# Patient Record
Sex: Female | Born: 1950 | Race: White | Hispanic: No | Marital: Married | Smoking: Former smoker
Health system: Southern US, Community
[De-identification: ages and names within clinical notes are randomized; demographics above are authoritative.]

## PROBLEM LIST (undated history)

## (undated) DIAGNOSIS — L02818 Cutaneous abscess of other sites: Secondary | ICD-10-CM

## (undated) DIAGNOSIS — K469 Unspecified abdominal hernia without obstruction or gangrene: Secondary | ICD-10-CM

## (undated) DIAGNOSIS — G8929 Other chronic pain: Secondary | ICD-10-CM

## (undated) DIAGNOSIS — F419 Anxiety disorder, unspecified: Secondary | ICD-10-CM

## (undated) DIAGNOSIS — L03818 Cellulitis of other sites: Secondary | ICD-10-CM

## (undated) DIAGNOSIS — Z8673 Personal history of transient ischemic attack (TIA), and cerebral infarction without residual deficits: Secondary | ICD-10-CM

## (undated) DIAGNOSIS — I700 Atherosclerosis of aorta: Secondary | ICD-10-CM

## (undated) DIAGNOSIS — S42302A Unspecified fracture of shaft of humerus, left arm, initial encounter for closed fracture: Secondary | ICD-10-CM

## (undated) DIAGNOSIS — F32A Depression, unspecified: Secondary | ICD-10-CM

## (undated) DIAGNOSIS — E538 Deficiency of other specified B group vitamins: Secondary | ICD-10-CM

## (undated) DIAGNOSIS — M545 Low back pain, unspecified: Secondary | ICD-10-CM

## (undated) DIAGNOSIS — M542 Cervicalgia: Secondary | ICD-10-CM

## (undated) DIAGNOSIS — G931 Anoxic brain damage, not elsewhere classified: Secondary | ICD-10-CM

## (undated) DIAGNOSIS — F329 Major depressive disorder, single episode, unspecified: Secondary | ICD-10-CM

## (undated) DIAGNOSIS — J449 Chronic obstructive pulmonary disease, unspecified: Secondary | ICD-10-CM

## (undated) DIAGNOSIS — G40909 Epilepsy, unspecified, not intractable, without status epilepticus: Secondary | ICD-10-CM

## (undated) DIAGNOSIS — K219 Gastro-esophageal reflux disease without esophagitis: Secondary | ICD-10-CM

## (undated) DIAGNOSIS — M25519 Pain in unspecified shoulder: Secondary | ICD-10-CM

## (undated) DIAGNOSIS — G629 Polyneuropathy, unspecified: Secondary | ICD-10-CM

## (undated) DIAGNOSIS — R269 Unspecified abnormalities of gait and mobility: Secondary | ICD-10-CM

## (undated) DIAGNOSIS — M1990 Unspecified osteoarthritis, unspecified site: Secondary | ICD-10-CM

## (undated) DIAGNOSIS — E785 Hyperlipidemia, unspecified: Secondary | ICD-10-CM

## (undated) DIAGNOSIS — E119 Type 2 diabetes mellitus without complications: Secondary | ICD-10-CM

## (undated) DIAGNOSIS — R569 Unspecified convulsions: Secondary | ICD-10-CM

## (undated) DIAGNOSIS — N390 Urinary tract infection, site not specified: Secondary | ICD-10-CM

## (undated) HISTORY — PX: TONSILLECTOMY: SUR1361

## (undated) HISTORY — PX: COLONOSCOPY: SHX174

## (undated) HISTORY — PX: TUBAL LIGATION: SHX77

## (undated) HISTORY — DX: Anoxic brain damage, not elsewhere classified: G93.1

## (undated) HISTORY — DX: Anxiety disorder, unspecified: F41.9

## (undated) HISTORY — DX: Other chronic pain: G89.29

## (undated) HISTORY — DX: Cervicalgia: M54.2

## (undated) HISTORY — DX: Low back pain: M54.5

## (undated) HISTORY — DX: Unspecified abnormalities of gait and mobility: R26.9

## (undated) HISTORY — DX: Type 2 diabetes mellitus without complications: E11.9

## (undated) HISTORY — PX: HERNIA REPAIR: SHX51

## (undated) HISTORY — DX: Hyperlipidemia, unspecified: E78.5

## (undated) HISTORY — PX: PANCREATECTOMY: SHX1019

## (undated) HISTORY — DX: Epilepsy, unspecified, not intractable, without status epilepticus: G40.909

## (undated) HISTORY — DX: Pain in unspecified shoulder: M25.519

## (undated) HISTORY — DX: Gastro-esophageal reflux disease without esophagitis: K21.9

## (undated) HISTORY — DX: Unspecified osteoarthritis, unspecified site: M19.90

## (undated) HISTORY — PX: DIAGNOSTIC LAPAROSCOPY: SUR761

## (undated) HISTORY — DX: Major depressive disorder, single episode, unspecified: F32.9

## (undated) HISTORY — DX: Chronic obstructive pulmonary disease, unspecified: J44.9

## (undated) HISTORY — DX: Depression, unspecified: F32.A

## (undated) HISTORY — DX: Low back pain, unspecified: M54.50

## (undated) HISTORY — DX: Deficiency of other specified B group vitamins: E53.8

---

## 1998-06-27 ENCOUNTER — Emergency Department (HOSPITAL_COMMUNITY): Admission: EM | Admit: 1998-06-27 | Discharge: 1998-06-28 | Payer: Self-pay | Admitting: Emergency Medicine

## 1998-07-11 ENCOUNTER — Encounter: Admission: RE | Admit: 1998-07-11 | Discharge: 1998-10-09 | Payer: Self-pay | Admitting: Internal Medicine

## 1998-11-15 ENCOUNTER — Encounter: Admission: RE | Admit: 1998-11-15 | Discharge: 1998-12-23 | Payer: Self-pay

## 1998-12-22 ENCOUNTER — Other Ambulatory Visit: Admission: RE | Admit: 1998-12-22 | Discharge: 1998-12-22 | Payer: Self-pay | Admitting: Obstetrics and Gynecology

## 1999-01-18 ENCOUNTER — Encounter: Admission: RE | Admit: 1999-01-18 | Discharge: 1999-04-13 | Payer: Self-pay | Admitting: Anesthesiology

## 1999-01-20 ENCOUNTER — Encounter: Payer: Self-pay | Admitting: Anesthesiology

## 1999-01-20 ENCOUNTER — Ambulatory Visit (HOSPITAL_COMMUNITY): Admission: RE | Admit: 1999-01-20 | Discharge: 1999-01-20 | Payer: Self-pay | Admitting: Anesthesiology

## 1999-02-13 ENCOUNTER — Other Ambulatory Visit: Admission: RE | Admit: 1999-02-13 | Discharge: 1999-02-13 | Payer: Self-pay | Admitting: Obstetrics and Gynecology

## 1999-03-27 ENCOUNTER — Encounter: Admission: RE | Admit: 1999-03-27 | Discharge: 1999-05-01 | Payer: Self-pay | Admitting: Orthopedic Surgery

## 1999-04-13 ENCOUNTER — Encounter: Admission: RE | Admit: 1999-04-13 | Discharge: 1999-05-01 | Payer: Self-pay | Admitting: Anesthesiology

## 1999-04-18 ENCOUNTER — Ambulatory Visit (HOSPITAL_COMMUNITY): Admission: RE | Admit: 1999-04-18 | Discharge: 1999-04-18 | Payer: Self-pay | Admitting: Internal Medicine

## 1999-04-18 ENCOUNTER — Encounter (INDEPENDENT_AMBULATORY_CARE_PROVIDER_SITE_OTHER): Payer: Self-pay

## 1999-05-02 ENCOUNTER — Encounter: Admission: RE | Admit: 1999-05-02 | Discharge: 1999-07-31 | Payer: Self-pay | Admitting: Anesthesiology

## 1999-05-03 ENCOUNTER — Encounter: Admission: RE | Admit: 1999-05-03 | Discharge: 1999-05-10 | Payer: Self-pay | Admitting: Orthopedic Surgery

## 1999-06-19 ENCOUNTER — Encounter: Admission: RE | Admit: 1999-06-19 | Discharge: 1999-09-17 | Payer: Self-pay | Admitting: Internal Medicine

## 1999-08-03 ENCOUNTER — Other Ambulatory Visit: Admission: RE | Admit: 1999-08-03 | Discharge: 1999-08-03 | Payer: Self-pay | Admitting: Obstetrics and Gynecology

## 1999-08-08 ENCOUNTER — Encounter: Admission: RE | Admit: 1999-08-08 | Discharge: 1999-11-06 | Payer: Self-pay | Admitting: Anesthesiology

## 1999-10-13 ENCOUNTER — Encounter: Admission: RE | Admit: 1999-10-13 | Discharge: 1999-12-01 | Payer: Self-pay | Admitting: Internal Medicine

## 1999-10-21 ENCOUNTER — Encounter: Payer: Self-pay | Admitting: Emergency Medicine

## 1999-10-21 ENCOUNTER — Emergency Department (HOSPITAL_COMMUNITY): Admission: EM | Admit: 1999-10-21 | Discharge: 1999-10-21 | Payer: Self-pay | Admitting: Emergency Medicine

## 1999-12-01 ENCOUNTER — Encounter: Admission: RE | Admit: 1999-12-01 | Discharge: 1999-12-22 | Payer: Self-pay | Admitting: Anesthesiology

## 2000-01-26 ENCOUNTER — Encounter: Admission: RE | Admit: 2000-01-26 | Discharge: 2000-03-08 | Payer: Self-pay | Admitting: Anesthesiology

## 2000-02-12 ENCOUNTER — Other Ambulatory Visit: Admission: RE | Admit: 2000-02-12 | Discharge: 2000-02-12 | Payer: Self-pay | Admitting: Obstetrics and Gynecology

## 2000-03-08 ENCOUNTER — Encounter: Admission: RE | Admit: 2000-03-08 | Discharge: 2000-06-06 | Payer: Self-pay | Admitting: Anesthesiology

## 2000-06-07 ENCOUNTER — Encounter: Admission: RE | Admit: 2000-06-07 | Discharge: 2000-09-05 | Payer: Self-pay | Admitting: Anesthesiology

## 2000-08-07 ENCOUNTER — Encounter: Admission: RE | Admit: 2000-08-07 | Discharge: 2000-10-14 | Payer: Self-pay | Admitting: Anesthesiology

## 2000-08-14 ENCOUNTER — Other Ambulatory Visit: Admission: RE | Admit: 2000-08-14 | Discharge: 2000-08-14 | Payer: Self-pay | Admitting: Obstetrics and Gynecology

## 2000-08-20 ENCOUNTER — Encounter: Admission: RE | Admit: 2000-08-20 | Discharge: 2000-08-20 | Payer: Self-pay | Admitting: Obstetrics and Gynecology

## 2000-08-20 ENCOUNTER — Encounter: Payer: Self-pay | Admitting: Obstetrics and Gynecology

## 2000-09-05 ENCOUNTER — Encounter: Admission: RE | Admit: 2000-09-05 | Discharge: 2000-12-04 | Payer: Self-pay | Admitting: Anesthesiology

## 2000-10-15 ENCOUNTER — Emergency Department (HOSPITAL_COMMUNITY): Admission: EM | Admit: 2000-10-15 | Discharge: 2000-10-15 | Payer: Self-pay | Admitting: Emergency Medicine

## 2000-10-15 ENCOUNTER — Encounter: Payer: Self-pay | Admitting: Emergency Medicine

## 2000-12-03 ENCOUNTER — Encounter: Admission: RE | Admit: 2000-12-03 | Discharge: 2000-12-03 | Payer: Self-pay | Admitting: Obstetrics and Gynecology

## 2000-12-03 ENCOUNTER — Encounter: Payer: Self-pay | Admitting: Obstetrics and Gynecology

## 2001-01-24 ENCOUNTER — Encounter: Admission: RE | Admit: 2001-01-24 | Discharge: 2001-04-24 | Payer: Self-pay | Admitting: Anesthesiology

## 2001-01-25 ENCOUNTER — Encounter: Payer: Self-pay | Admitting: Neurology

## 2001-01-25 ENCOUNTER — Ambulatory Visit (HOSPITAL_COMMUNITY): Admission: RE | Admit: 2001-01-25 | Discharge: 2001-01-25 | Payer: Self-pay | Admitting: Neurology

## 2001-02-20 ENCOUNTER — Other Ambulatory Visit: Admission: RE | Admit: 2001-02-20 | Discharge: 2001-02-20 | Payer: Self-pay | Admitting: Obstetrics and Gynecology

## 2001-10-17 ENCOUNTER — Other Ambulatory Visit: Admission: RE | Admit: 2001-10-17 | Discharge: 2001-10-17 | Payer: Self-pay | Admitting: Obstetrics and Gynecology

## 2002-02-21 ENCOUNTER — Emergency Department (HOSPITAL_COMMUNITY): Admission: EM | Admit: 2002-02-21 | Discharge: 2002-02-21 | Payer: Self-pay | Admitting: Emergency Medicine

## 2002-02-21 ENCOUNTER — Encounter: Payer: Self-pay | Admitting: Emergency Medicine

## 2002-05-20 ENCOUNTER — Encounter: Payer: Self-pay | Admitting: Otolaryngology

## 2002-05-20 ENCOUNTER — Encounter: Admission: RE | Admit: 2002-05-20 | Discharge: 2002-05-20 | Payer: Self-pay | Admitting: Otolaryngology

## 2002-09-21 ENCOUNTER — Emergency Department (HOSPITAL_COMMUNITY): Admission: EM | Admit: 2002-09-21 | Discharge: 2002-09-21 | Payer: Self-pay

## 2002-11-02 ENCOUNTER — Encounter: Admission: RE | Admit: 2002-11-02 | Discharge: 2003-01-31 | Payer: Self-pay | Admitting: Internal Medicine

## 2002-12-09 ENCOUNTER — Other Ambulatory Visit: Admission: RE | Admit: 2002-12-09 | Discharge: 2002-12-09 | Payer: Self-pay | Admitting: Obstetrics and Gynecology

## 2003-01-22 ENCOUNTER — Encounter: Payer: Self-pay | Admitting: Internal Medicine

## 2003-01-22 ENCOUNTER — Encounter: Admission: RE | Admit: 2003-01-22 | Discharge: 2003-01-22 | Payer: Self-pay | Admitting: Internal Medicine

## 2003-02-01 ENCOUNTER — Encounter: Admission: RE | Admit: 2003-02-01 | Discharge: 2003-05-02 | Payer: Self-pay | Admitting: Internal Medicine

## 2003-04-21 ENCOUNTER — Encounter (INDEPENDENT_AMBULATORY_CARE_PROVIDER_SITE_OTHER): Payer: Self-pay | Admitting: *Deleted

## 2003-04-21 ENCOUNTER — Ambulatory Visit (HOSPITAL_COMMUNITY): Admission: RE | Admit: 2003-04-21 | Discharge: 2003-04-21 | Payer: Self-pay | Admitting: Gastroenterology

## 2003-04-21 LAB — HM COLONOSCOPY

## 2003-05-21 ENCOUNTER — Encounter: Payer: Self-pay | Admitting: Gastroenterology

## 2003-05-21 ENCOUNTER — Ambulatory Visit (HOSPITAL_COMMUNITY): Admission: RE | Admit: 2003-05-21 | Discharge: 2003-05-21 | Payer: Self-pay | Admitting: Gastroenterology

## 2003-08-10 ENCOUNTER — Encounter: Admission: RE | Admit: 2003-08-10 | Discharge: 2003-09-09 | Payer: Self-pay | Admitting: Anesthesiology

## 2003-12-17 ENCOUNTER — Encounter: Admission: RE | Admit: 2003-12-17 | Discharge: 2003-12-17 | Payer: Self-pay | Admitting: General Surgery

## 2004-01-27 ENCOUNTER — Ambulatory Visit (HOSPITAL_COMMUNITY): Admission: RE | Admit: 2004-01-27 | Discharge: 2004-01-27 | Payer: Self-pay | Admitting: Internal Medicine

## 2004-02-29 ENCOUNTER — Other Ambulatory Visit: Admission: RE | Admit: 2004-02-29 | Discharge: 2004-02-29 | Payer: Self-pay | Admitting: Obstetrics and Gynecology

## 2004-05-30 ENCOUNTER — Emergency Department (HOSPITAL_COMMUNITY): Admission: EM | Admit: 2004-05-30 | Discharge: 2004-05-30 | Payer: Self-pay | Admitting: Emergency Medicine

## 2004-06-26 ENCOUNTER — Ambulatory Visit: Payer: Self-pay | Admitting: Internal Medicine

## 2004-08-23 ENCOUNTER — Ambulatory Visit: Payer: Self-pay | Admitting: Internal Medicine

## 2004-09-29 ENCOUNTER — Ambulatory Visit: Payer: Self-pay | Admitting: Internal Medicine

## 2004-10-11 ENCOUNTER — Ambulatory Visit: Payer: Self-pay | Admitting: Internal Medicine

## 2004-11-13 ENCOUNTER — Ambulatory Visit: Payer: Self-pay | Admitting: Internal Medicine

## 2004-11-13 ENCOUNTER — Ambulatory Visit (HOSPITAL_COMMUNITY): Admission: RE | Admit: 2004-11-13 | Discharge: 2004-11-13 | Payer: Self-pay | Admitting: Internal Medicine

## 2004-11-30 ENCOUNTER — Ambulatory Visit: Payer: Self-pay | Admitting: Internal Medicine

## 2004-12-18 ENCOUNTER — Ambulatory Visit: Payer: Self-pay | Admitting: Internal Medicine

## 2005-01-23 ENCOUNTER — Ambulatory Visit: Payer: Self-pay | Admitting: Internal Medicine

## 2005-02-12 ENCOUNTER — Ambulatory Visit: Payer: Self-pay | Admitting: Internal Medicine

## 2005-03-27 ENCOUNTER — Ambulatory Visit: Payer: Self-pay | Admitting: Internal Medicine

## 2005-03-29 ENCOUNTER — Ambulatory Visit (HOSPITAL_COMMUNITY): Admission: RE | Admit: 2005-03-29 | Discharge: 2005-03-29 | Payer: Self-pay | Admitting: Internal Medicine

## 2005-04-10 ENCOUNTER — Encounter: Admission: RE | Admit: 2005-04-10 | Discharge: 2005-05-09 | Payer: Self-pay | Admitting: Internal Medicine

## 2005-04-24 ENCOUNTER — Ambulatory Visit: Payer: Self-pay | Admitting: Internal Medicine

## 2005-05-09 ENCOUNTER — Ambulatory Visit: Payer: Self-pay | Admitting: Internal Medicine

## 2005-05-10 ENCOUNTER — Encounter: Admission: RE | Admit: 2005-05-10 | Discharge: 2005-06-12 | Payer: Self-pay | Admitting: Internal Medicine

## 2005-05-22 ENCOUNTER — Ambulatory Visit: Payer: Self-pay | Admitting: Internal Medicine

## 2005-05-22 ENCOUNTER — Ambulatory Visit (HOSPITAL_COMMUNITY): Admission: RE | Admit: 2005-05-22 | Discharge: 2005-05-22 | Payer: Self-pay | Admitting: Internal Medicine

## 2005-05-31 ENCOUNTER — Ambulatory Visit: Payer: Self-pay | Admitting: Internal Medicine

## 2005-06-13 ENCOUNTER — Encounter: Admission: RE | Admit: 2005-06-13 | Discharge: 2005-09-11 | Payer: Self-pay | Admitting: Internal Medicine

## 2005-06-27 ENCOUNTER — Other Ambulatory Visit: Admission: RE | Admit: 2005-06-27 | Discharge: 2005-06-27 | Payer: Self-pay | Admitting: Obstetrics and Gynecology

## 2005-07-19 ENCOUNTER — Ambulatory Visit: Payer: Self-pay | Admitting: Internal Medicine

## 2005-07-26 ENCOUNTER — Ambulatory Visit: Payer: Self-pay | Admitting: Internal Medicine

## 2005-08-03 ENCOUNTER — Ambulatory Visit: Payer: Self-pay | Admitting: Internal Medicine

## 2005-08-27 ENCOUNTER — Ambulatory Visit: Payer: Self-pay | Admitting: Internal Medicine

## 2005-09-13 ENCOUNTER — Ambulatory Visit: Payer: Self-pay | Admitting: Internal Medicine

## 2005-10-15 ENCOUNTER — Ambulatory Visit: Payer: Self-pay | Admitting: Internal Medicine

## 2005-10-26 ENCOUNTER — Ambulatory Visit: Payer: Self-pay | Admitting: Internal Medicine

## 2005-11-27 ENCOUNTER — Ambulatory Visit: Payer: Self-pay | Admitting: Internal Medicine

## 2005-12-10 ENCOUNTER — Encounter: Admission: RE | Admit: 2005-12-10 | Discharge: 2006-01-13 | Payer: Self-pay | Admitting: Internal Medicine

## 2006-01-09 ENCOUNTER — Ambulatory Visit: Payer: Self-pay | Admitting: Internal Medicine

## 2006-01-14 ENCOUNTER — Ambulatory Visit: Payer: Self-pay | Admitting: Internal Medicine

## 2006-01-14 ENCOUNTER — Encounter: Admission: RE | Admit: 2006-01-14 | Discharge: 2006-02-06 | Payer: Self-pay | Admitting: Internal Medicine

## 2006-01-29 ENCOUNTER — Ambulatory Visit: Payer: Self-pay | Admitting: Internal Medicine

## 2006-03-06 ENCOUNTER — Ambulatory Visit: Payer: Self-pay | Admitting: Internal Medicine

## 2006-03-26 ENCOUNTER — Ambulatory Visit: Payer: Self-pay | Admitting: Internal Medicine

## 2006-04-01 ENCOUNTER — Ambulatory Visit (HOSPITAL_COMMUNITY): Admission: RE | Admit: 2006-04-01 | Discharge: 2006-04-01 | Payer: Self-pay | Admitting: Internal Medicine

## 2006-05-08 ENCOUNTER — Ambulatory Visit: Payer: Self-pay | Admitting: Internal Medicine

## 2006-06-14 ENCOUNTER — Ambulatory Visit: Payer: Self-pay | Admitting: Internal Medicine

## 2006-07-03 ENCOUNTER — Ambulatory Visit: Payer: Self-pay | Admitting: Internal Medicine

## 2006-07-17 ENCOUNTER — Ambulatory Visit: Payer: Self-pay | Admitting: Internal Medicine

## 2006-08-02 ENCOUNTER — Other Ambulatory Visit: Admission: RE | Admit: 2006-08-02 | Discharge: 2006-08-02 | Payer: Self-pay | Admitting: Obstetrics and Gynecology

## 2006-08-19 ENCOUNTER — Ambulatory Visit: Payer: Self-pay | Admitting: Family Medicine

## 2006-09-09 ENCOUNTER — Ambulatory Visit: Payer: Self-pay | Admitting: Internal Medicine

## 2006-09-13 ENCOUNTER — Ambulatory Visit: Payer: Self-pay | Admitting: Internal Medicine

## 2006-11-04 ENCOUNTER — Ambulatory Visit: Payer: Self-pay | Admitting: Internal Medicine

## 2006-11-04 LAB — CONVERTED CEMR LAB
ALT: 23 units/L (ref 0–40)
AST: 22 units/L (ref 0–37)
BUN: 8 mg/dL (ref 6–23)
Bilirubin Urine: NEGATIVE
Creatinine, Ser: 0.6 mg/dL (ref 0.4–1.2)
Glucose, Bld: 152 mg/dL — ABNORMAL HIGH (ref 70–99)
Hgb A1c MFr Bld: 6.9 % — ABNORMAL HIGH (ref 4.6–6.0)
Ketones, ur: NEGATIVE mg/dL
Leukocytes, UA: NEGATIVE
Nitrite: NEGATIVE
Potassium: 4 meq/L (ref 3.5–5.1)
Sodium: 139 meq/L (ref 135–145)
Specific Gravity, Urine: 1.015 (ref 1.000–1.03)
TSH: 1.52 microintl units/mL (ref 0.35–5.50)
Total Protein, Urine: NEGATIVE mg/dL
Urine Glucose: NEGATIVE mg/dL
Urobilinogen, UA: 0.2 (ref 0.0–1.0)
Vit D, 1,25-Dihydroxy: 13 — ABNORMAL LOW (ref 20–57)
pH: 6 (ref 5.0–8.0)

## 2006-11-29 ENCOUNTER — Ambulatory Visit: Payer: Self-pay | Admitting: Internal Medicine

## 2006-12-27 ENCOUNTER — Ambulatory Visit: Payer: Self-pay | Admitting: Endocrinology

## 2006-12-27 LAB — CONVERTED CEMR LAB
ALT: 29 units/L (ref 0–40)
AST: 28 units/L (ref 0–37)
Albumin: 3.9 g/dL (ref 3.5–5.2)
Alkaline Phosphatase: 73 units/L (ref 39–117)
Amylase: 49 units/L (ref 27–131)
BUN: 9 mg/dL (ref 6–23)
Basophils Absolute: 0 10*3/uL (ref 0.0–0.1)
Basophils Relative: 0.3 % (ref 0.0–1.0)
Bilirubin Urine: NEGATIVE
Bilirubin, Direct: 0.1 mg/dL (ref 0.0–0.3)
CO2: 35 meq/L — ABNORMAL HIGH (ref 19–32)
Calcium: 9.2 mg/dL (ref 8.4–10.5)
Chloride: 102 meq/L (ref 96–112)
Creatinine, Ser: 0.6 mg/dL (ref 0.4–1.2)
Crystals: NEGATIVE
Eosinophils Absolute: 0.1 10*3/uL (ref 0.0–0.6)
Eosinophils Relative: 1.3 % (ref 0.0–5.0)
GFR calc Af Amer: 133 mL/min
GFR calc non Af Amer: 110 mL/min
Glucose, Bld: 138 mg/dL — ABNORMAL HIGH (ref 70–99)
HCT: 41.7 % (ref 36.0–46.0)
Hemoglobin, Urine: NEGATIVE
Hemoglobin: 14.4 g/dL (ref 12.0–15.0)
Ketones, ur: NEGATIVE mg/dL
Leukocytes, UA: NEGATIVE
Lymphocytes Relative: 31.1 % (ref 12.0–46.0)
MCHC: 34.6 g/dL (ref 30.0–36.0)
MCV: 92 fL (ref 78.0–100.0)
Monocytes Absolute: 0.5 10*3/uL (ref 0.2–0.7)
Monocytes Relative: 6.2 % (ref 3.0–11.0)
Mucus, UA: NEGATIVE
Neutro Abs: 4.9 10*3/uL (ref 1.4–7.7)
Neutrophils Relative %: 61.1 % (ref 43.0–77.0)
Nitrite: NEGATIVE
Platelets: 205 10*3/uL (ref 150–400)
Potassium: 4.6 meq/L (ref 3.5–5.1)
RBC: 4.53 M/uL (ref 3.87–5.11)
RDW: 12.6 % (ref 11.5–14.6)
Sodium: 142 meq/L (ref 135–145)
Specific Gravity, Urine: 1.025 (ref 1.000–1.03)
Squamous Epithelial / LPF: NEGATIVE /lpf
Total Bilirubin: 0.7 mg/dL (ref 0.3–1.2)
Total Protein, Urine: NEGATIVE mg/dL
Total Protein: 6.5 g/dL (ref 6.0–8.3)
Urine Glucose: NEGATIVE mg/dL
Urobilinogen, UA: 0.2 (ref 0.0–1.0)
WBC, UA: NONE SEEN cells/hpf
WBC: 8 10*3/uL (ref 4.5–10.5)
pH: 6 (ref 5.0–8.0)

## 2007-01-01 ENCOUNTER — Ambulatory Visit: Payer: Self-pay | Admitting: Cardiovascular Disease

## 2007-01-08 ENCOUNTER — Ambulatory Visit: Payer: Self-pay | Admitting: Internal Medicine

## 2007-02-26 ENCOUNTER — Encounter: Payer: Self-pay | Admitting: Endocrinology

## 2007-02-26 DIAGNOSIS — G4700 Insomnia, unspecified: Secondary | ICD-10-CM | POA: Insufficient documentation

## 2007-02-26 DIAGNOSIS — G8929 Other chronic pain: Secondary | ICD-10-CM | POA: Insufficient documentation

## 2007-02-26 DIAGNOSIS — I69398 Other sequelae of cerebral infarction: Secondary | ICD-10-CM | POA: Insufficient documentation

## 2007-02-26 DIAGNOSIS — K219 Gastro-esophageal reflux disease without esophagitis: Secondary | ICD-10-CM | POA: Insufficient documentation

## 2007-02-26 DIAGNOSIS — M25519 Pain in unspecified shoulder: Secondary | ICD-10-CM | POA: Insufficient documentation

## 2007-02-26 DIAGNOSIS — R569 Unspecified convulsions: Secondary | ICD-10-CM | POA: Insufficient documentation

## 2007-02-26 DIAGNOSIS — I872 Venous insufficiency (chronic) (peripheral): Secondary | ICD-10-CM | POA: Insufficient documentation

## 2007-02-26 DIAGNOSIS — F32A Depression, unspecified: Secondary | ICD-10-CM | POA: Insufficient documentation

## 2007-02-26 DIAGNOSIS — R269 Unspecified abnormalities of gait and mobility: Secondary | ICD-10-CM | POA: Insufficient documentation

## 2007-02-26 DIAGNOSIS — R609 Edema, unspecified: Secondary | ICD-10-CM | POA: Insufficient documentation

## 2007-02-26 DIAGNOSIS — F329 Major depressive disorder, single episode, unspecified: Secondary | ICD-10-CM | POA: Insufficient documentation

## 2007-02-26 DIAGNOSIS — K432 Incisional hernia without obstruction or gangrene: Secondary | ICD-10-CM | POA: Insufficient documentation

## 2007-02-28 ENCOUNTER — Ambulatory Visit: Payer: Self-pay | Admitting: Internal Medicine

## 2007-03-18 ENCOUNTER — Ambulatory Visit: Payer: Self-pay | Admitting: Internal Medicine

## 2007-04-07 ENCOUNTER — Ambulatory Visit (HOSPITAL_COMMUNITY): Admission: RE | Admit: 2007-04-07 | Discharge: 2007-04-07 | Payer: Self-pay | Admitting: Internal Medicine

## 2007-04-29 ENCOUNTER — Ambulatory Visit: Payer: Self-pay | Admitting: Internal Medicine

## 2007-05-01 ENCOUNTER — Ambulatory Visit: Payer: Self-pay | Admitting: Cardiology

## 2007-05-19 ENCOUNTER — Ambulatory Visit: Payer: Self-pay | Admitting: Internal Medicine

## 2007-06-09 ENCOUNTER — Ambulatory Visit: Payer: Self-pay | Admitting: Internal Medicine

## 2007-06-15 ENCOUNTER — Encounter: Payer: Self-pay | Admitting: Emergency Medicine

## 2007-06-16 ENCOUNTER — Inpatient Hospital Stay (HOSPITAL_COMMUNITY): Admission: EM | Admit: 2007-06-16 | Discharge: 2007-06-17 | Payer: Self-pay | Admitting: Internal Medicine

## 2007-06-16 ENCOUNTER — Ambulatory Visit: Payer: Self-pay | Admitting: Internal Medicine

## 2007-06-25 ENCOUNTER — Ambulatory Visit (HOSPITAL_COMMUNITY): Admission: RE | Admit: 2007-06-25 | Discharge: 2007-06-25 | Payer: Self-pay | Admitting: Internal Medicine

## 2007-07-03 ENCOUNTER — Encounter: Admission: RE | Admit: 2007-07-03 | Discharge: 2007-07-03 | Payer: Self-pay | Admitting: Neurology

## 2007-07-09 ENCOUNTER — Ambulatory Visit: Payer: Self-pay | Admitting: Internal Medicine

## 2007-07-14 ENCOUNTER — Encounter: Admission: RE | Admit: 2007-07-14 | Discharge: 2007-07-14 | Payer: Self-pay | Admitting: Neurology

## 2007-07-31 ENCOUNTER — Encounter: Admission: RE | Admit: 2007-07-31 | Discharge: 2007-07-31 | Payer: Self-pay | Admitting: Neurology

## 2007-08-27 ENCOUNTER — Encounter: Admission: RE | Admit: 2007-08-27 | Discharge: 2007-08-27 | Payer: Self-pay | Admitting: Neurology

## 2007-09-04 ENCOUNTER — Other Ambulatory Visit: Admission: RE | Admit: 2007-09-04 | Discharge: 2007-09-04 | Payer: Self-pay | Admitting: Obstetrics & Gynecology

## 2007-09-05 ENCOUNTER — Encounter: Payer: Self-pay | Admitting: Internal Medicine

## 2007-09-05 ENCOUNTER — Ambulatory Visit: Payer: Self-pay | Admitting: Internal Medicine

## 2007-09-17 ENCOUNTER — Encounter: Payer: Self-pay | Admitting: Internal Medicine

## 2007-09-19 ENCOUNTER — Ambulatory Visit: Payer: Self-pay | Admitting: Internal Medicine

## 2007-10-09 ENCOUNTER — Encounter: Payer: Self-pay | Admitting: Internal Medicine

## 2007-11-05 ENCOUNTER — Encounter: Payer: Self-pay | Admitting: Internal Medicine

## 2007-11-20 ENCOUNTER — Encounter: Admission: RE | Admit: 2007-11-20 | Discharge: 2007-11-20 | Payer: Self-pay | Admitting: Neurology

## 2007-12-04 ENCOUNTER — Encounter: Payer: Self-pay | Admitting: Internal Medicine

## 2008-01-02 ENCOUNTER — Encounter: Payer: Self-pay | Admitting: Internal Medicine

## 2008-01-07 ENCOUNTER — Ambulatory Visit: Payer: Self-pay | Admitting: Internal Medicine

## 2008-01-08 ENCOUNTER — Ambulatory Visit: Payer: Self-pay | Admitting: Internal Medicine

## 2008-01-16 ENCOUNTER — Telehealth: Payer: Self-pay | Admitting: Internal Medicine

## 2008-01-17 ENCOUNTER — Ambulatory Visit: Payer: Self-pay | Admitting: Family Medicine

## 2008-02-20 ENCOUNTER — Encounter: Payer: Self-pay | Admitting: Internal Medicine

## 2008-03-03 ENCOUNTER — Encounter: Payer: Self-pay | Admitting: Internal Medicine

## 2008-03-24 ENCOUNTER — Ambulatory Visit: Payer: Self-pay | Admitting: Internal Medicine

## 2008-04-07 ENCOUNTER — Ambulatory Visit (HOSPITAL_COMMUNITY): Admission: RE | Admit: 2008-04-07 | Discharge: 2008-04-07 | Payer: Self-pay | Admitting: Internal Medicine

## 2008-04-07 ENCOUNTER — Encounter: Payer: Self-pay | Admitting: Internal Medicine

## 2008-05-10 ENCOUNTER — Ambulatory Visit: Payer: Self-pay | Admitting: Internal Medicine

## 2008-05-14 ENCOUNTER — Encounter: Payer: Self-pay | Admitting: Internal Medicine

## 2008-06-04 ENCOUNTER — Encounter: Payer: Self-pay | Admitting: Internal Medicine

## 2008-06-08 ENCOUNTER — Ambulatory Visit: Payer: Self-pay | Admitting: Internal Medicine

## 2008-06-08 DIAGNOSIS — L0292 Furuncle, unspecified: Secondary | ICD-10-CM | POA: Insufficient documentation

## 2008-06-08 DIAGNOSIS — L0293 Carbuncle, unspecified: Secondary | ICD-10-CM

## 2008-06-08 LAB — CONVERTED CEMR LAB
BUN: 5 mg/dL — ABNORMAL LOW (ref 6–23)
Basophils Absolute: 0.1 10*3/uL (ref 0.0–0.1)
Basophils Relative: 0.8 % (ref 0.0–3.0)
CO2: 30 meq/L (ref 19–32)
Calcium: 9 mg/dL (ref 8.4–10.5)
Chloride: 103 meq/L (ref 96–112)
Creatinine, Ser: 0.6 mg/dL (ref 0.4–1.2)
Crystals: NEGATIVE
Eosinophils Absolute: 0.2 10*3/uL (ref 0.0–0.7)
Eosinophils Relative: 2 % (ref 0.0–5.0)
GFR calc Af Amer: 133 mL/min
GFR calc non Af Amer: 110 mL/min
Glucose, Bld: 159 mg/dL — ABNORMAL HIGH (ref 70–99)
HCT: 44.6 % (ref 36.0–46.0)
Hemoglobin: 15.7 g/dL — ABNORMAL HIGH (ref 12.0–15.0)
Lymphocytes Relative: 22 % (ref 12.0–46.0)
MCHC: 35.2 g/dL (ref 30.0–36.0)
MCV: 94.3 fL (ref 78.0–100.0)
Monocytes Absolute: 0.5 10*3/uL (ref 0.1–1.0)
Monocytes Relative: 5.2 % (ref 3.0–12.0)
Mucus, UA: NEGATIVE
Neutro Abs: 7.2 10*3/uL (ref 1.4–7.7)
Neutrophils Relative %: 70 % (ref 43.0–77.0)
Nitrite: NEGATIVE
Platelets: 207 10*3/uL (ref 150–400)
Potassium: 4 meq/L (ref 3.5–5.1)
RBC / HPF: NONE SEEN
RBC: 4.72 M/uL (ref 3.87–5.11)
RDW: 12.4 % (ref 11.5–14.6)
Sodium: 144 meq/L (ref 135–145)
Specific Gravity, Urine: 1.02 (ref 1.000–1.03)
TSH: 1.88 microintl units/mL (ref 0.35–5.50)
Total Protein, Urine: NEGATIVE mg/dL
Urine Glucose: NEGATIVE mg/dL
Urobilinogen, UA: 1 (ref 0.0–1.0)
WBC: 10.2 10*3/uL (ref 4.5–10.5)
pH: 5.5 (ref 5.0–8.0)

## 2008-06-11 ENCOUNTER — Encounter: Payer: Self-pay | Admitting: Internal Medicine

## 2008-06-14 ENCOUNTER — Encounter: Payer: Self-pay | Admitting: Internal Medicine

## 2008-06-15 LAB — CONVERTED CEMR LAB: Vit D, 1,25-Dihydroxy: 25 — ABNORMAL LOW (ref 30–89)

## 2008-06-25 ENCOUNTER — Ambulatory Visit: Payer: Self-pay | Admitting: Internal Medicine

## 2008-06-28 ENCOUNTER — Telehealth: Payer: Self-pay | Admitting: Internal Medicine

## 2008-07-15 ENCOUNTER — Encounter: Payer: Self-pay | Admitting: Internal Medicine

## 2008-10-01 DIAGNOSIS — E119 Type 2 diabetes mellitus without complications: Secondary | ICD-10-CM

## 2008-10-01 HISTORY — DX: Type 2 diabetes mellitus without complications: E11.9

## 2008-10-04 ENCOUNTER — Telehealth: Payer: Self-pay | Admitting: Internal Medicine

## 2008-10-25 ENCOUNTER — Other Ambulatory Visit: Admission: RE | Admit: 2008-10-25 | Discharge: 2008-10-25 | Payer: Self-pay | Admitting: Obstetrics & Gynecology

## 2008-11-29 ENCOUNTER — Ambulatory Visit: Payer: Self-pay | Admitting: Internal Medicine

## 2008-11-30 ENCOUNTER — Telehealth: Payer: Self-pay | Admitting: Internal Medicine

## 2008-12-20 ENCOUNTER — Encounter: Payer: Self-pay | Admitting: Internal Medicine

## 2008-12-20 ENCOUNTER — Telehealth: Payer: Self-pay | Admitting: Internal Medicine

## 2008-12-29 ENCOUNTER — Ambulatory Visit: Payer: Self-pay | Admitting: Internal Medicine

## 2009-01-07 ENCOUNTER — Telehealth: Payer: Self-pay | Admitting: Internal Medicine

## 2009-01-11 ENCOUNTER — Encounter: Payer: Self-pay | Admitting: Internal Medicine

## 2009-01-12 ENCOUNTER — Telehealth (INDEPENDENT_AMBULATORY_CARE_PROVIDER_SITE_OTHER): Payer: Self-pay | Admitting: *Deleted

## 2009-01-13 ENCOUNTER — Ambulatory Visit: Payer: Self-pay | Admitting: Internal Medicine

## 2009-01-13 DIAGNOSIS — R21 Rash and other nonspecific skin eruption: Secondary | ICD-10-CM | POA: Insufficient documentation

## 2009-01-13 DIAGNOSIS — F17200 Nicotine dependence, unspecified, uncomplicated: Secondary | ICD-10-CM | POA: Insufficient documentation

## 2009-01-26 ENCOUNTER — Telehealth: Payer: Self-pay | Admitting: Internal Medicine

## 2009-02-02 ENCOUNTER — Telehealth: Payer: Self-pay | Admitting: Internal Medicine

## 2009-02-10 ENCOUNTER — Encounter: Payer: Self-pay | Admitting: Internal Medicine

## 2009-02-25 ENCOUNTER — Ambulatory Visit: Payer: Self-pay | Admitting: Internal Medicine

## 2009-02-25 DIAGNOSIS — M545 Low back pain, unspecified: Secondary | ICD-10-CM | POA: Insufficient documentation

## 2009-02-25 DIAGNOSIS — M1990 Unspecified osteoarthritis, unspecified site: Secondary | ICD-10-CM | POA: Insufficient documentation

## 2009-02-25 DIAGNOSIS — J449 Chronic obstructive pulmonary disease, unspecified: Secondary | ICD-10-CM | POA: Insufficient documentation

## 2009-03-23 ENCOUNTER — Encounter: Payer: Self-pay | Admitting: Internal Medicine

## 2009-03-31 ENCOUNTER — Encounter: Payer: Self-pay | Admitting: Internal Medicine

## 2009-03-31 ENCOUNTER — Encounter: Admission: RE | Admit: 2009-03-31 | Discharge: 2009-06-01 | Payer: Self-pay | Admitting: Internal Medicine

## 2009-04-13 ENCOUNTER — Ambulatory Visit: Payer: Self-pay | Admitting: Internal Medicine

## 2009-05-13 ENCOUNTER — Ambulatory Visit (HOSPITAL_COMMUNITY): Admission: RE | Admit: 2009-05-13 | Discharge: 2009-05-13 | Payer: Self-pay | Admitting: Internal Medicine

## 2009-06-13 ENCOUNTER — Encounter: Payer: Self-pay | Admitting: Internal Medicine

## 2009-06-15 ENCOUNTER — Encounter: Payer: Self-pay | Admitting: Internal Medicine

## 2009-06-21 ENCOUNTER — Ambulatory Visit: Payer: Self-pay | Admitting: Internal Medicine

## 2009-06-21 ENCOUNTER — Telehealth: Payer: Self-pay | Admitting: Internal Medicine

## 2009-06-21 DIAGNOSIS — A689 Relapsing fever, unspecified: Secondary | ICD-10-CM | POA: Insufficient documentation

## 2009-06-21 LAB — CONVERTED CEMR LAB
Bilirubin Urine: NEGATIVE
Ketones, ur: NEGATIVE mg/dL
Leukocytes, UA: NEGATIVE
Nitrite: NEGATIVE
Specific Gravity, Urine: 1.02 (ref 1.000–1.030)
Total Protein, Urine: NEGATIVE mg/dL
Urine Glucose: >=1000 mg/dL
Urobilinogen, UA: 0.2 (ref 0.0–1.0)
pH: 6 (ref 5.0–8.0)

## 2009-06-24 ENCOUNTER — Telehealth: Payer: Self-pay | Admitting: Internal Medicine

## 2009-06-27 ENCOUNTER — Telehealth: Payer: Self-pay | Admitting: Internal Medicine

## 2009-06-29 ENCOUNTER — Telehealth: Payer: Self-pay | Admitting: Internal Medicine

## 2009-07-07 ENCOUNTER — Ambulatory Visit: Payer: Self-pay | Admitting: Internal Medicine

## 2009-07-07 DIAGNOSIS — E119 Type 2 diabetes mellitus without complications: Secondary | ICD-10-CM | POA: Insufficient documentation

## 2009-07-11 ENCOUNTER — Telehealth: Payer: Self-pay | Admitting: Internal Medicine

## 2009-07-12 ENCOUNTER — Encounter: Payer: Self-pay | Admitting: Internal Medicine

## 2009-07-18 ENCOUNTER — Encounter: Payer: Self-pay | Admitting: Internal Medicine

## 2009-07-21 ENCOUNTER — Telehealth: Payer: Self-pay | Admitting: Internal Medicine

## 2009-07-29 ENCOUNTER — Encounter: Payer: Self-pay | Admitting: Internal Medicine

## 2009-09-08 ENCOUNTER — Ambulatory Visit: Payer: Self-pay | Admitting: Internal Medicine

## 2009-10-01 DIAGNOSIS — E538 Deficiency of other specified B group vitamins: Secondary | ICD-10-CM

## 2009-10-01 HISTORY — DX: Deficiency of other specified B group vitamins: E53.8

## 2009-10-19 ENCOUNTER — Telehealth: Payer: Self-pay | Admitting: Internal Medicine

## 2009-10-20 ENCOUNTER — Telehealth (INDEPENDENT_AMBULATORY_CARE_PROVIDER_SITE_OTHER): Payer: Self-pay | Admitting: *Deleted

## 2009-10-20 ENCOUNTER — Telehealth: Payer: Self-pay | Admitting: Internal Medicine

## 2009-10-21 ENCOUNTER — Ambulatory Visit: Payer: Self-pay | Admitting: Internal Medicine

## 2009-10-21 DIAGNOSIS — K921 Melena: Secondary | ICD-10-CM | POA: Insufficient documentation

## 2009-10-21 DIAGNOSIS — E559 Vitamin D deficiency, unspecified: Secondary | ICD-10-CM | POA: Insufficient documentation

## 2009-10-21 LAB — CONVERTED CEMR LAB
ALT: 19 units/L (ref 0–35)
AST: 20 units/L (ref 0–37)
Albumin: 4.3 g/dL (ref 3.5–5.2)
Alkaline Phosphatase: 57 units/L (ref 39–117)
BUN: 8 mg/dL (ref 6–23)
Basophils Absolute: 0 10*3/uL (ref 0.0–0.1)
Basophils Relative: 0.5 % (ref 0.0–3.0)
Bilirubin Urine: NEGATIVE
Bilirubin, Direct: 0 mg/dL (ref 0.0–0.3)
CO2: 31 meq/L (ref 19–32)
Calcium: 9.6 mg/dL (ref 8.4–10.5)
Chloride: 101 meq/L (ref 96–112)
Creatinine, Ser: 0.5 mg/dL (ref 0.4–1.2)
Eosinophils Absolute: 0.1 10*3/uL (ref 0.0–0.7)
Eosinophils Relative: 0.9 % (ref 0.0–5.0)
GFR calc non Af Amer: 134.16 mL/min (ref >60)
Glucose, Bld: 202 mg/dL — ABNORMAL HIGH (ref 70–99)
HCT: 44.3 % (ref 36.0–46.0)
Hemoglobin: 14.7 g/dL (ref 12.0–15.0)
Hgb A1c MFr Bld: 7.3 % — ABNORMAL HIGH (ref 4.6–6.5)
INR: 1 (ref 0.8–1.0)
Ketones, ur: NEGATIVE mg/dL
Lymphocytes Relative: 24.1 % (ref 12.0–46.0)
Lymphs Abs: 2.2 10*3/uL (ref 0.7–4.0)
MCHC: 33.1 g/dL (ref 30.0–36.0)
MCV: 95.2 fL (ref 78.0–100.0)
Monocytes Absolute: 0.5 10*3/uL (ref 0.1–1.0)
Monocytes Relative: 4.9 % (ref 3.0–12.0)
Neutro Abs: 6.5 10*3/uL (ref 1.4–7.7)
Neutrophils Relative %: 69.6 % (ref 43.0–77.0)
Nitrite: NEGATIVE
Platelets: 214 10*3/uL (ref 150.0–400.0)
Potassium: 3.9 meq/L (ref 3.5–5.1)
Prothrombin Time: 10 s (ref 9.1–11.7)
RBC: 4.66 M/uL (ref 3.87–5.11)
RDW: 13.3 % (ref 11.5–14.6)
Sodium: 139 meq/L (ref 135–145)
Specific Gravity, Urine: <=1.005 (ref 1.000–1.030)
TSH: 0.99 microintl units/mL (ref 0.35–5.50)
Total Bilirubin: 0.7 mg/dL (ref 0.3–1.2)
Total Protein, Urine: NEGATIVE mg/dL
Total Protein: 7 g/dL (ref 6.0–8.3)
Urine Glucose: NEGATIVE mg/dL
Urobilinogen, UA: 0.2 (ref 0.0–1.0)
Vit D, 25-Hydroxy: 29 ng/mL — ABNORMAL LOW (ref 30–89)
WBC: 9.3 10*3/uL (ref 4.5–10.5)
aPTT: 25.9 s (ref 21.7–28.8)
pH: 5.5 (ref 5.0–8.0)

## 2009-10-21 LAB — HM DIABETES FOOT EXAM

## 2009-10-24 ENCOUNTER — Encounter: Payer: Self-pay | Admitting: Internal Medicine

## 2009-10-24 ENCOUNTER — Telehealth: Payer: Self-pay | Admitting: Internal Medicine

## 2009-10-26 ENCOUNTER — Encounter: Payer: Self-pay | Admitting: Internal Medicine

## 2009-11-02 ENCOUNTER — Telehealth: Payer: Self-pay | Admitting: Internal Medicine

## 2009-11-04 ENCOUNTER — Encounter: Payer: Self-pay | Admitting: Internal Medicine

## 2009-11-14 ENCOUNTER — Ambulatory Visit: Payer: Self-pay | Admitting: Internal Medicine

## 2009-11-14 DIAGNOSIS — F419 Anxiety disorder, unspecified: Secondary | ICD-10-CM | POA: Insufficient documentation

## 2009-11-14 DIAGNOSIS — R634 Abnormal weight loss: Secondary | ICD-10-CM | POA: Insufficient documentation

## 2009-11-14 DIAGNOSIS — F411 Generalized anxiety disorder: Secondary | ICD-10-CM

## 2009-12-02 ENCOUNTER — Encounter: Admission: RE | Admit: 2009-12-02 | Discharge: 2009-12-02 | Payer: Self-pay | Admitting: Obstetrics and Gynecology

## 2010-01-03 ENCOUNTER — Telehealth: Payer: Self-pay | Admitting: Internal Medicine

## 2010-01-13 ENCOUNTER — Ambulatory Visit: Payer: Self-pay | Admitting: Internal Medicine

## 2010-01-16 LAB — CONVERTED CEMR LAB
ALT: 20 units/L (ref 0–35)
AST: 19 units/L (ref 0–37)
Albumin: 4.3 g/dL (ref 3.5–5.2)
Alkaline Phosphatase: 51 units/L (ref 39–117)
BUN: 13 mg/dL (ref 6–23)
Basophils Absolute: 0 10*3/uL (ref 0.0–0.1)
Basophils Relative: 0.4 % (ref 0.0–3.0)
Bilirubin, Direct: 0.1 mg/dL (ref 0.0–0.3)
CO2: 34 meq/L — ABNORMAL HIGH (ref 19–32)
Calcium: 10.2 mg/dL (ref 8.4–10.5)
Chloride: 96 meq/L (ref 96–112)
Creatinine, Ser: 0.6 mg/dL (ref 0.4–1.2)
Eosinophils Absolute: 0.1 10*3/uL (ref 0.0–0.7)
Eosinophils Relative: 1.6 % (ref 0.0–5.0)
GFR calc non Af Amer: 108.62 mL/min (ref >60)
Glucose, Bld: 106 mg/dL — ABNORMAL HIGH (ref 70–99)
HCT: 41.7 % (ref 36.0–46.0)
Hemoglobin: 14.3 g/dL (ref 12.0–15.0)
Hgb A1c MFr Bld: 7.6 % — ABNORMAL HIGH (ref 4.6–6.5)
Lymphocytes Relative: 32.3 % (ref 12.0–46.0)
Lymphs Abs: 2.8 10*3/uL (ref 0.7–4.0)
MCHC: 34.3 g/dL (ref 30.0–36.0)
MCV: 92.5 fL (ref 78.0–100.0)
Monocytes Absolute: 0.4 10*3/uL (ref 0.1–1.0)
Monocytes Relative: 5 % (ref 3.0–12.0)
Neutro Abs: 5.2 10*3/uL (ref 1.4–7.7)
Neutrophils Relative %: 60.7 % (ref 43.0–77.0)
Platelets: 208 10*3/uL (ref 150.0–400.0)
Potassium: 4.6 meq/L (ref 3.5–5.1)
RBC: 4.51 M/uL (ref 3.87–5.11)
RDW: 13.2 % (ref 11.5–14.6)
Sodium: 140 meq/L (ref 135–145)
Total Bilirubin: 0.4 mg/dL (ref 0.3–1.2)
Total Protein: 6.4 g/dL (ref 6.0–8.3)
WBC: 8.6 10*3/uL (ref 4.5–10.5)

## 2010-02-20 ENCOUNTER — Encounter: Payer: Self-pay | Admitting: Internal Medicine

## 2010-03-28 ENCOUNTER — Ambulatory Visit: Payer: Self-pay | Admitting: Internal Medicine

## 2010-03-28 LAB — CONVERTED CEMR LAB
ALT: 18 units/L (ref 0–35)
AST: 18 units/L (ref 0–37)
Albumin: 4.2 g/dL (ref 3.5–5.2)
Alkaline Phosphatase: 60 units/L (ref 39–117)
BUN: 11 mg/dL (ref 6–23)
Bilirubin, Direct: 0.1 mg/dL (ref 0.0–0.3)
CO2: 33 meq/L — ABNORMAL HIGH (ref 19–32)
Calcium: 9.3 mg/dL (ref 8.4–10.5)
Chloride: 102 meq/L (ref 96–112)
Cholesterol: 224 mg/dL — ABNORMAL HIGH (ref 0–200)
Creatinine, Ser: 0.5 mg/dL (ref 0.4–1.2)
Direct LDL: 128.7 mg/dL
GFR calc non Af Amer: 133.96 mL/min (ref >60)
Glucose, Bld: 132 mg/dL — ABNORMAL HIGH (ref 70–99)
HDL: 71 mg/dL (ref >39.00)
Hgb A1c MFr Bld: 7.3 % — ABNORMAL HIGH (ref 4.6–6.5)
Potassium: 4.6 meq/L (ref 3.5–5.1)
Sodium: 141 meq/L (ref 135–145)
TSH: 1.4 microintl units/mL (ref 0.35–5.50)
Total Bilirubin: 0.4 mg/dL (ref 0.3–1.2)
Total CHOL/HDL Ratio: 3
Total Protein: 6.6 g/dL (ref 6.0–8.3)
Triglycerides: 115 mg/dL (ref 0.0–149.0)
VLDL: 23 mg/dL (ref 0.0–40.0)
Vit D, 25-Hydroxy: 63 ng/mL (ref 30–89)
Vitamin B-12: 263 pg/mL (ref 211–911)

## 2010-04-04 ENCOUNTER — Ambulatory Visit: Payer: Self-pay | Admitting: Internal Medicine

## 2010-04-04 DIAGNOSIS — E538 Deficiency of other specified B group vitamins: Secondary | ICD-10-CM | POA: Insufficient documentation

## 2010-04-04 DIAGNOSIS — E785 Hyperlipidemia, unspecified: Secondary | ICD-10-CM | POA: Insufficient documentation

## 2010-05-09 ENCOUNTER — Encounter: Payer: Self-pay | Admitting: Internal Medicine

## 2010-05-15 ENCOUNTER — Encounter: Admission: RE | Admit: 2010-05-15 | Discharge: 2010-05-15 | Payer: Self-pay | Admitting: Internal Medicine

## 2010-05-15 LAB — HM MAMMOGRAPHY: HM Mammogram: NEGATIVE

## 2010-05-29 ENCOUNTER — Encounter: Payer: Self-pay | Admitting: Internal Medicine

## 2010-07-03 ENCOUNTER — Ambulatory Visit: Payer: Self-pay | Admitting: Internal Medicine

## 2010-07-04 LAB — CONVERTED CEMR LAB
BUN: 14 mg/dL (ref 6–23)
CO2: 32 meq/L (ref 19–32)
Calcium: 10.5 mg/dL (ref 8.4–10.5)
Chloride: 98 meq/L (ref 96–112)
Cholesterol: 213 mg/dL — ABNORMAL HIGH (ref 0–200)
Creatinine, Ser: 0.5 mg/dL (ref 0.4–1.2)
Direct LDL: 127.8 mg/dL
GFR calc non Af Amer: 127.91 mL/min (ref >60)
Glucose, Bld: 157 mg/dL — ABNORMAL HIGH (ref 70–99)
HDL: 68.5 mg/dL (ref >39.00)
Hgb A1c MFr Bld: 7.3 % — ABNORMAL HIGH (ref 4.6–6.5)
Potassium: 4.8 meq/L (ref 3.5–5.1)
Sodium: 139 meq/L (ref 135–145)
Total CHOL/HDL Ratio: 3
Triglycerides: 123 mg/dL (ref 0.0–149.0)
VLDL: 24.6 mg/dL (ref 0.0–40.0)
Vitamin B-12: 414 pg/mL (ref 211–911)

## 2010-07-05 ENCOUNTER — Ambulatory Visit: Payer: Self-pay | Admitting: Internal Medicine

## 2010-09-20 ENCOUNTER — Telehealth: Payer: Self-pay | Admitting: Internal Medicine

## 2010-09-22 ENCOUNTER — Telehealth: Payer: Self-pay | Admitting: Internal Medicine

## 2010-10-10 ENCOUNTER — Ambulatory Visit
Admission: RE | Admit: 2010-10-10 | Discharge: 2010-10-10 | Payer: Self-pay | Source: Home / Self Care | Attending: Internal Medicine | Admitting: Internal Medicine

## 2010-10-10 ENCOUNTER — Other Ambulatory Visit: Payer: Self-pay | Admitting: Internal Medicine

## 2010-10-10 LAB — LIPID PANEL
Cholesterol: 213 mg/dL — ABNORMAL HIGH (ref 0–200)
HDL: 69.1 mg/dL (ref >39.00)
Total CHOL/HDL Ratio: 3
Triglycerides: 104 mg/dL (ref 0.0–149.0)
VLDL: 20.8 mg/dL (ref 0.0–40.0)

## 2010-10-10 LAB — BASIC METABOLIC PANEL
BUN: 18 mg/dL (ref 6–23)
CO2: 33 mEq/L — ABNORMAL HIGH (ref 19–32)
Calcium: 9.6 mg/dL (ref 8.4–10.5)
Chloride: 100 mEq/L (ref 96–112)
Creatinine, Ser: 0.4 mg/dL (ref 0.4–1.2)
GFR: 163.52 mL/min (ref >60.00)
Glucose, Bld: 131 mg/dL — ABNORMAL HIGH (ref 70–99)
Potassium: 4.4 mEq/L (ref 3.5–5.1)
Sodium: 140 mEq/L (ref 135–145)

## 2010-10-10 LAB — HEMOGLOBIN A1C: Hgb A1c MFr Bld: 7.1 % — ABNORMAL HIGH (ref 4.6–6.5)

## 2010-10-10 LAB — LDL CHOLESTEROL, DIRECT: Direct LDL: 141.9 mg/dL

## 2010-10-11 ENCOUNTER — Ambulatory Visit
Admission: RE | Admit: 2010-10-11 | Discharge: 2010-10-11 | Payer: Self-pay | Source: Home / Self Care | Attending: Internal Medicine | Admitting: Internal Medicine

## 2010-10-11 DIAGNOSIS — L02818 Cutaneous abscess of other sites: Secondary | ICD-10-CM

## 2010-10-11 DIAGNOSIS — L03818 Cellulitis of other sites: Secondary | ICD-10-CM

## 2010-10-11 HISTORY — DX: Cellulitis of other sites: L03.818

## 2010-10-11 HISTORY — DX: Cutaneous abscess of other sites: L02.818

## 2010-10-12 ENCOUNTER — Encounter: Payer: Self-pay | Admitting: Internal Medicine

## 2010-10-26 ENCOUNTER — Telehealth: Payer: Self-pay | Admitting: Internal Medicine

## 2010-10-27 ENCOUNTER — Encounter: Payer: Self-pay | Admitting: Internal Medicine

## 2010-11-02 NOTE — Assessment & Plan Note (Signed)
Summary: 3 MO ROV /NWS   Vital Signs:  Patient profile:   60 year old female Height:      65 inches (165.10 cm) Weight:      114 pounds (51.82 kg) BMI:     19.04 Temp:     98.7 degrees F (37.06 degrees C) oral Pulse rate:   95 / minute Pulse rhythm:   regular BP sitting:   118 / 80  (left arm) Cuff size:   regular  Vitals Entered By: Lanier Prude, CMA(AAMA) (April 04, 2010 1:38 PM) CC: 3 mo f/u Is Patient Diabetic? Yes   Primary Care Provider:  Tresa Garter MD  CC:  3 mo f/u.  History of Present Illness: The patient presents for a follow up of hypertension, diabetes, hyperlipidemia, gait issues   Preventive Screening-Counseling & Management  Alcohol-Tobacco     Smoking Status: current     Packs/Day: 1.0  Current Medications (verified): 1)  Ambien 10 Mg  Tabs (Zolpidem Tartrate) .Marland Kitchen.. 1 At Bedtime As Needed 2)  Lorazepam 2 Mg  Tabs (Lorazepam) .... Three Times A Day 3)  Detrol La 4 Mg  Cp24 (Tolterodine Tartrate) .... At Bedtime 4)  Fosamax 70 Mg  Tabs (Alendronate Sodium) .Marland Kitchen.. 1 Weekly 5)  Kadian 60 Mg Xr24h-Cap (Morphine Sulfate) .... Two Times A Day 6)  Oxycodone-Acetaminophen 10-325 Mg Tabs (Oxycodone-Acetaminophen) .... Q6hr As Needed 7)  Calcitriol 0.5 Mcg Caps (Calcitriol) .Marland Kitchen.. 1 Bid 8)  Freestyle Lite Test  Strp (Glucose Blood) .... Qd 9)  Freestyle Unistick Ii Lancets  Misc (Lancets) .... Qd 10)  Glucophage Xr 500 Mg Xr24h-Tab (Metformin Hcl) .... 2 Daily 11)  Vitamin D (Ergocalciferol) 50000 Unit Caps (Ergocalciferol) .Marland Kitchen.. 1 By Mouth Weekly  Allergies (verified): 1)  ! Fosamax (Alendronate Sodium) 2)  ! Prozac (Fluoxetine Hcl)  Past History:  Past Medical History: Depression GERD Seizure disorder Gait disorder H/o anoxic brain injury Chronic neck and shoulder pain COPD Low back pain Osteoarthritis Diabetes mellitus, type II 2010 Anxiety Hyperlipidemia Vit B12 def 2011  Social History: Packs/Day:  1.0  Review of Systems  The  patient denies fever, dyspnea on exertion, abdominal pain, and melena.    Physical Exam  General:  no acute distress; alert,appropriate and cooperative throughout examination Nose:  External nasal examination shows no deformity or inflammation. Nasal mucosa are pink and moist without lesions or exudates. Mouth:  Oral mucosa and oropharynx without lesions or exudates.  Teeth in good repair. Lungs:  normal respiratory effort, no intercostal retractions, no accessory muscle use, normal breath sounds, no dullness, no fremitus, no crackles, and no wheezes.   Heart:  normal rate, regular rhythm, no murmur, no gallop, no rub, and no JVD.   Abdomen:  soft, non-tender, no distention, no masses, no guarding, no rigidity, no rebound tenderness, no hepatomegaly, no splenomegaly, abdominal scar(s), and bowel sounds hyperactive.   Msk:  walks w/a walker weak LE Extremities:  No edema B Neurologic:  alert & oriented X3, abnormal gait, and ataxic gait.   Skin:  WNL  Psych:  Oriented X3, good eye contact, and depressed appearing.     Impression & Recommendations:  Problem # 1:  VITAMIN B12 DEFICIENCY (ICD-266.2) Assessment New  Orders: Vit B12 1000 mcg (J3420) Admin of Therapeutic Inj  intramuscular or subcutaneous (16109) Start by mouth B12 The labs were reviewed with the patient.   Problem # 2:  HYPERLIPIDEMIA (ICD-272.4) Assessment: Unchanged Discussed - suggested a statin trial next time (DM, smoker) The  labs were reviewed with the patient.   Problem # 3:  ANXIETY (ICD-300.00) Assessment: Unchanged  Her updated medication list for this problem includes:    Lorazepam 2 Mg Tabs (Lorazepam) .Marland Kitchen... Three times a day    Trazodone Hcl 50 Mg Tabs (Trazodone hcl) .Marland Kitchen... 1-2 by mouth at bedtime  Problem # 4:  INSOMNIA (ICD-780.52) Assessment: Unchanged  Her updated medication list for this problem includes:    Ambien 10 Mg Tabs (Zolpidem tartrate) .Marland Kitchen... 1 at bedtime as needed  Problem # 5:   TOBACCO USER (ICD-305.1) Assessment: Comment Only  Encouraged smoking cessation and discussed different methods for smoking cessation.   Complete Medication List: 1)  Ambien 10 Mg Tabs (Zolpidem tartrate) .Marland Kitchen.. 1 at bedtime as needed 2)  Lorazepam 2 Mg Tabs (Lorazepam) .... Three times a day 3)  Detrol La 4 Mg Cp24 (Tolterodine tartrate) .... At bedtime 4)  Fosamax 70 Mg Tabs (Alendronate sodium) .Marland KitchenMarland KitchenMarland Kitchen 1 weekly 5)  Kadian 60 Mg Xr24h-cap (Morphine sulfate) .... Two times a day 6)  Oxycodone-acetaminophen 10-325 Mg Tabs (Oxycodone-acetaminophen) .... Q6hr as needed 7)  Calcitriol 0.5 Mcg Caps (Calcitriol) .Marland Kitchen.. 1 bid 8)  Freestyle Lite Test Strp (Glucose blood) .... Qd 9)  Freestyle Unistick Ii Lancets Misc (Lancets) .... Qd 10)  Glucophage Xr 500 Mg Xr24h-tab (Metformin hcl) .... 2 daily 11)  Vitamin D (ergocalciferol) 50000 Unit Caps (Ergocalciferol) .Marland Kitchen.. 1 by mouth weekly 12)  Trazodone Hcl 50 Mg Tabs (Trazodone hcl) .Marland Kitchen.. 1-2 by mouth at bedtime 13)  Vitamin B-12 500 Mcg Tabs (Cyanocobalamin) .Marland Kitchen.. 1 by mouth once daily for vitamin b12 deficiency  Patient Instructions: 1)  Please schedule a follow-up appointment in 3 months. 2)  BMP prior to visit, ICD-9: 3)  HbgA1C prior to visit, ICD-9: 4)  Vit B12 266.20  250.00 5)  Lipid Panel prior to visit, ICD-9: Prescriptions: VITAMIN B-12 500 MCG TABS (CYANOCOBALAMIN) 1 by mouth once daily for Vitamin B12 deficiency  #30 x 12   Entered and Authorized by:   Tresa Garter MD   Signed by:   Tresa Garter MD on 04/04/2010   Method used:   Print then Give to Patient   RxID:   [id number] LORAZEPAM 2 MG  TABS (LORAZEPAM) three times a day  #90 x 6   Entered and Authorized by:   Tresa Garter MD   Signed by:   Tresa Garter MD on 04/04/2010   Method used:   Print then Give to Patient   RxID:   [id number] AMBIEN 10 MG  TABS (ZOLPIDEM TARTRATE) 1 at bedtime as needed  #30 x 6   Entered and Authorized by:    Tresa Garter MD   Signed by:   Tresa Garter MD on 04/04/2010   Method used:   Print then Give to Patient   RxID:   [id number] TRAZODONE HCL 50 MG TABS (TRAZODONE HCL) 1-2 by mouth at bedtime  #60 x 6   Entered and Authorized by:   Tresa Garter MD   Signed by:   Tresa Garter MD on 04/04/2010   Method used:   [facility]        [facility] [phone][facility] (retail)       [address]       [place], [place]  [address]       Ph: [phone] or [phone]       Fax: [phone]   RxID:   [id number]  Medication Administration  Injection # 1:    Medication: Vit B12 1000 mcg    Diagnosis: VITAMIN B12 DEFICIENCY (ICD-266.2)    Route: IM    Site: L deltoid    Exp Date: 12/31/2011    Lot #: [id number]    Mfr: [employer] Regent    Given by: Lanier Prude, CMA(AAMA) (April 04, 2010 2:54 PM)  Orders Added: 1)  Vit B12 1000 mcg [J3420] 2)  Admin of Therapeutic Inj  intramuscular or subcutaneous [96372] 3)  Est. Patient Level IV [14782]

## 2010-11-02 NOTE — Assessment & Plan Note (Signed)
Summary: flu shot/avp/cd  Nurse Visit   Vital Signs:  Patient Profile:   60 Years Old Female CC:      flu shot Temp:     98.6 degrees F oral  Vitals Entered By: Orlan Leavens (June 25, 2008 2:20 PM)                 Prior Medications: AMBIEN 10 MG  TABS (ZOLPIDEM TARTRATE) 1 at bedtime as needed LORAZEPAM 2 MG  TABS (LORAZEPAM) three times a day DETROL LA 4 MG  CP24 (TOLTERODINE TARTRATE) at bedtime SENOKOT 8.6 MG  TABS (SENNOSIDES) once daily  as needed FOSAMAX 70 MG  TABS (ALENDRONATE SODIUM) 1 weekly KLOR-CON M20 20 MEQ CR-TABS (POTASSIUM CHLORIDE CRYS CR) once daily KADIAN 80 MG XR24H-CAP (MORPHINE SULFATE) two times a day OXYCODONE-ACETAMINOPHEN 10-325 MG TABS (OXYCODONE-ACETAMINOPHEN) Q6HR as needed CALCITRIOL 0.5 MCG CAPS (CALCITRIOL) 1 by mouth qd Current Allergies: ! PREVACID (LANSOPRAZOLE) ! FOSAMAX (ALENDRONATE SODIUM) ! PROZAC (FLUOXETINE HCL)    Orders Added: 1)  Flu Vaccine 17yrs + [90658] 2)  Administration Flu vaccine [G0008]    Flu Vaccine Consent Questions     Do you have a history of severe allergic reactions to this vaccine? no    Any prior history of allergic reactions to egg and/or gelatin? no    Do you have a sensitivity to the preservative Thimersol? no    Do you have a past history of Guillan-Barre Syndrome? no    Do you currently have an acute febrile illness? no    Have you ever had a severe reaction to latex? no    Vaccine information given and explained to patient? yes    Are you currently pregnant? no    Lot Number:[id number]   Site Given  Left Deltoid IM]  .medflu

## 2010-11-02 NOTE — Assessment & Plan Note (Signed)
Summary: CONSULT ON MED/$50/PN   Vital Signs:  Patient profile:   60 year old female Weight:      138 pounds Temp:     99.3 degrees F oral Pulse rate:   106 / minute BP sitting:   126 / 72  (left arm)  Vitals Entered By: Tora Perches (Feb 25, 2009 1:19 PM) CC: consult Is Patient Diabetic? No   CC:  consult.  History of Present Illness: The patient presents for a follow up of neck and shoulder  pain, anxiety, depression and headaches. C/o worsened gait issues   Current Medications (verified): 1)  Ambien 10 Mg  Tabs (Zolpidem Tartrate) .Marland Kitchen.. 1 At Bedtime As Needed 2)  Lorazepam 2 Mg  Tabs (Lorazepam) .... Three Times A Day 3)  Detrol La 4 Mg  Cp24 (Tolterodine Tartrate) .... At Bedtime 4)  Fosamax 70 Mg  Tabs (Alendronate Sodium) .Marland Kitchen.. 1 Weekly 5)  Kadian 80 Mg Xr24h-Cap (Morphine Sulfate) .... Two Times A Day 6)  Oxycodone-Acetaminophen 10-325 Mg Tabs (Oxycodone-Acetaminophen) .... Q6hr As Needed 7)  Calcitriol 0.5 Mcg Caps (Calcitriol) .Marland Kitchen.. 1 Bid 8)  Vitamin D 1000 Unit Tabs (Cholecalciferol) .... Once Daily 9)  Topicort 0.25 % Crea (Desoximetasone) .... Use Two Times A Day - Tid 10)  Chantix Starting Month Pak 0.5 Mg X 11 & 1 Mg X 42  Misc (Varenicline Tartrate) .... 0.5mg  By Mouth Once Daily For 3 Days, Then Twice Daily For 4 Days and Then 1mg  By Mouth 2 Times Daily 11)  Chantix 1 Mg Tabs (Varenicline Tartrate) .Marland Kitchen.. 1 Tab By Mouth 2 Times Daily  Allergies: 1)  ! Prevacid (Lansoprazole) 2)  ! Fosamax (Alendronate Sodium) 3)  ! Prozac (Fluoxetine Hcl)  Past History:  Past Surgical History: Last updated: 01/17/2008 PART PANCREATECTOMY incisional hernia repair  Family History: Last updated: 06/08/2008 Family History Hypertension  Social History: Last updated: 06/08/2008 Occupation:disabled Married Current Smoker Alcohol use-no  Past Medical History: Depression GERD Seizure disorder Gait disorder H/o anoxic brain injury Chronic neck and shoulder  pain COPD Low back pain Osteoarthritis  Physical Exam  General:  no acute distress; alert,appropriate and cooperative throughout examination Ears:  External ear exam shows no significant lesions or deformities.  Otoscopic examination reveals clear canals, tympanic membranes are intact bilaterally without bulging, retraction, inflammation or discharge. Hearing is grossly normal bilaterally. Nose:  External nasal examination shows no deformity or inflammation. Nasal mucosa are pink and moist without lesions or exudates. Mouth:  Oral mucosa and oropharynx without lesions or exudates.  Teeth in good repair. Lungs:  Normal respiratory effort, chest expands symmetrically. Lungs are clear to auscultation, no crackles or wheezes. Heart:  Normal rate and regular rhythm. S1 and S2 normal without gallop, murmur, click, rub or other extra sounds. Abdomen:  Bowel sounds positive,abdomen soft and non-tender without masses, organomegaly or hernias noted. Msk:  Using a walker, small ataxic steps Neurologic:  Ataxia Skin:  Clear Psych:  Oriented X3.  normally interactive and good eye contact.     Impression & Recommendations:  Problem # 1:  UNSTEADY GAIT (ICD-781.2) Assessment Deteriorated  Refused labs Start PT The office visit took longer than 20 min with patient councelling for more than 50% of the 20 min   Problem # 2:  LOW BACK PAIN (ICD-724.2) Assessment: Unchanged  Her updated medication list for this problem includes:    Kadian 80 Mg Xr24h-cap (Morphine sulfate) .Marland Kitchen..Marland Kitchen Two times a day    Oxycodone-acetaminophen 10-325 Mg Tabs (Oxycodone-acetaminophen) ..... Q6hr as  needed  Problem # 3:  INSOMNIA (ICD-780.52)  Her updated medication list for this problem includes:    Ambien 10 Mg Tabs (Zolpidem tartrate) .Marland Kitchen... 1 at bedtime as needed  Problem # 4:  TOBACCO USER (ICD-305.1)  Her updated medication list for this problem includes:    Chantix Starting Month Pak 0.5 Mg X 11 & 1 Mg X 42  Misc (Varenicline tartrate) .Marland Kitchen... 0.5mg  by mouth once daily for 3 days, then twice daily for 4 days and then 1mg  by mouth 2 times daily    Chantix 1 Mg Tabs (Varenicline tartrate) .Marland Kitchen... 1 tab by mouth 2 times daily  Encouraged smoking cessation and discussed different methods for smoking cessation.   Problem # 5:  SHOULDER PAIN, LEFT (ICD-719.41) Assessment: Unchanged  Her updated medication list for this problem includes:    Kadian 80 Mg Xr24h-cap (Morphine sulfate) .Marland Kitchen..Marland Kitchen Two times a day    Oxycodone-acetaminophen 10-325 Mg Tabs (Oxycodone-acetaminophen) ..... Q6hr as needed  Complete Medication List: 1)  Ambien 10 Mg Tabs (Zolpidem tartrate) .Marland Kitchen.. 1 at bedtime as needed 2)  Lorazepam 2 Mg Tabs (Lorazepam) .... Three times a day 3)  Detrol La 4 Mg Cp24 (Tolterodine tartrate) .... At bedtime 4)  Fosamax 70 Mg Tabs (Alendronate sodium) .Marland KitchenMarland KitchenMarland Kitchen 1 weekly 5)  Kadian 80 Mg Xr24h-cap (Morphine sulfate) .... Two times a day 6)  Oxycodone-acetaminophen 10-325 Mg Tabs (Oxycodone-acetaminophen) .... Q6hr as needed 7)  Calcitriol 0.5 Mcg Caps (Calcitriol) .Marland Kitchen.. 1 bid 8)  Vitamin D 1000 Unit Tabs (Cholecalciferol) .... Once daily 9)  Topicort 0.25 % Crea (Desoximetasone) .... Use two times a day - tid 10)  Chantix Starting Month Pak 0.5 Mg X 11 & 1 Mg X 42 Misc (Varenicline tartrate) .... 0.5mg  by mouth once daily for 3 days, then twice daily for 4 days and then 1mg  by mouth 2 times daily 11)  Chantix 1 Mg Tabs (Varenicline tartrate) .Marland Kitchen.. 1 tab by mouth 2 times daily  Other Orders: Physical Therapy Referral (PT)  Patient Instructions: 1)  Start a chair yoga class 2)  [email or web address] 3)  [email or web address] 4)  Please schedule a follow-up appointment in 3 months.

## 2010-11-02 NOTE — Medication Information (Signed)
Summary: Prior Authorization Request and Approval for Chantix/[facility]  Prior Authorization Request and Approval for Chantix/[facility]   Imported By: Maryln Gottron 02/18/2009 11:11:25  _____________________________________________________________________  External Attachment:    Type:   Image     Comment:   External Document

## 2010-11-02 NOTE — Progress Notes (Signed)
Summary: refill request  Phone Note Refill Request Message from:  Pharmacy on June 29, 2009 4:19 PM  Refills Requested: Medication #1:  FREESTYLE LITE TEST  STRP qd  Medication #2:  FREESTYLE UNISTICK II LANCETS  MISC qd  Method Requested: Electronic Initial call taken by: Rock Nephew CMA,  June 29, 2009 4:19 PM    Prescriptions: FREESTYLE UNISTICK II LANCETS  MISC (LANCETS) qd  #100 x 5   Entered by:   Rock Nephew CMA   Authorized by:   Tresa Garter MD   Signed by:   Rock Nephew CMA on 06/29/2009   Method used:   [facility]        [facility] [address] [phone][facility] (retail)       [address]       [place], [place]  [address]       Ph: [phone] or [phone]       Fax: [phone]   RxID:   [id number] FREESTYLE LITE TEST  STRP (GLUCOSE BLOOD) qd  #100 x 5   Entered by:   Rock Nephew CMA   Authorized by:   Tresa Garter MD   Signed by:   Rock Nephew CMA on 06/29/2009   Method used:   [facility]        [facility] [address] [phone]* (retail)       [address]       [place], [place]  [address]       Ph: [phone] or [phone]       Fax: [phone]   RxID:   [id number]

## 2010-11-02 NOTE — Miscellaneous (Signed)
Summary: cont P T order/[facility]  cont P T order/[facility]   Imported By: Lester [place] 06/10/2008 10:15:55  _____________________________________________________________________  External Attachment:    Type:   Image     Comment:   External Document

## 2010-11-02 NOTE — Miscellaneous (Signed)
Summary: Care Plan/[facility]   Care Plan/[facility]   Imported By: Maryln Gottron 01/15/2008 14:39:23  _____________________________________________________________________  External Attachment:    Type:   Image     Comment:   External Document

## 2010-11-02 NOTE — Miscellaneous (Signed)
Summary: lorazepam  Clinical Lists Changes  Medications: Rx of LORAZEPAM 2 MG  TABS (LORAZEPAM) three times a day;  #90 x 3;  Signed;  Entered by: Tora Perches;  Authorized by: Tresa Garter MD;  Method used: [facility] [phone]*, [address], [place], [place]  [address], Ph: [phone] or [phone], Fax: [phone]    Prescriptions: LORAZEPAM 2 MG  TABS (LORAZEPAM) three times a day  #90 x 3   Entered by:   Tora Perches   Authorized by:   Tresa Garter MD   Signed by:   Tora Perches on 03/03/2008   Method used:   [facility]ed to ...       [id number]       [address]       [place], [place]  [address]       Ph: [phone] or [phone]       Fax: [phone]   RxID:   [id number]

## 2010-11-02 NOTE — Assessment & Plan Note (Signed)
Summary: 3 mo rov/nws   Vital Signs:  Patient profile:   60 year old female Height:      65 inches Weight:      108 pounds BMI:     18.04 Temp:     97.6 degrees F oral Pulse rate:   89 / minute Pulse rhythm:   regular BP sitting:   116 / 82  (left arm) Cuff size:   regular  Vitals Entered By: Lanier Prude, CMA(AAMA) (July 05, 2010 2:23 PM) CC: 3 mo f/u Is Patient Diabetic? No   Primary Care Jourdain Guay:  Tresa Garter MD  CC:  3 mo f/u.  History of Present Illness: The patient presents for a follow up of hypertension, diabetes, hyperlipidemia   Current Medications (verified): 1)  Ambien 10 Mg  Tabs (Zolpidem Tartrate) .Marland Kitchen.. 1 At Bedtime As Needed 2)  Lorazepam 2 Mg  Tabs (Lorazepam) .... Three Times A Day 3)  Detrol La 4 Mg  Cp24 (Tolterodine Tartrate) .... At Bedtime 4)  Fosamax 70 Mg  Tabs (Alendronate Sodium) .Marland Kitchen.. 1 Weekly 5)  Kadian 60 Mg Xr24h-Cap (Morphine Sulfate) .... Two Times A Day 6)  Oxycodone-Acetaminophen 10-325 Mg Tabs (Oxycodone-Acetaminophen) .... Q6hr As Needed 7)  Calcitriol 0.5 Mcg Caps (Calcitriol) .Marland Kitchen.. 1 Bid 8)  Freestyle Lite Test  Strp (Glucose Blood) .... Qd 9)  Freestyle Unistick Ii Lancets  Misc (Lancets) .... Qd 10)  Glucophage Xr 500 Mg Xr24h-Tab (Metformin Hcl) .... 2 Daily 11)  Vitamin D (Ergocalciferol) 50000 Unit Caps (Ergocalciferol) .Marland Kitchen.. 1 By Mouth Weekly 12)  Trazodone Hcl 50 Mg Tabs (Trazodone Hcl) .Marland Kitchen.. 1-2 By Mouth At Bedtime 13)  Vitamin B-12 500 Mcg Tabs (Cyanocobalamin) .Marland Kitchen.. 1 By Mouth Once Daily For Vitamin B12 Deficiency  Allergies (verified): 1)  ! Fosamax (Alendronate Sodium) 2)  ! Prozac (Fluoxetine Hcl)  Past History:  Past Medical History: Last updated: 04/04/2010 Depression GERD Seizure disorder Gait disorder H/o anoxic brain injury Chronic neck and shoulder pain COPD Low back pain Osteoarthritis Diabetes mellitus, type II 2010 Anxiety Hyperlipidemia Vit B12 def 2011  Past Surgical History: Last  updated: 01/17/2008 PART PANCREATECTOMY incisional hernia repair  Social History: Last updated: 06/08/2008 Occupation:disabled Married Current Smoker Alcohol use-no  Review of Systems       The patient complains of weight loss and difficulty walking.  The patient denies anorexia, vision loss, dyspnea on exertion, peripheral edema, and abdominal pain.    Physical Exam  General:  Thin, in no acute distress; alert,appropriate and cooperative throughout examination Eyes:  pink moist mm., no icterus Nose:  External nasal examination shows no deformity or inflammation. Nasal mucosa are pink and moist without lesions or exudates. Mouth:  Oral mucosa and oropharynx without lesions or exudates.  Teeth in good repair. Lungs:  normal respiratory effort, no intercostal retractions, no accessory muscle use, normal breath sounds, no dullness, no fremitus, no crackles, and no wheezes.   Heart:  normal rate, regular rhythm, no murmur, no gallop, no rub, and no JVD.   Abdomen:  soft, non-tender, no distention, no masses, no guarding, no rigidity, no rebound tenderness, no hepatomegaly, no splenomegaly, abdominal scar(s), and bowel sounds hyperactive.   Msk:  walks w/a walker weak LE Extremities:  No edema B Neurologic:  alert & oriented X3, abnormal gait, and ataxic gait.   Skin:  WNL  Psych:  Oriented X3, good eye contact, and depressed appearing.     Impression & Recommendations:  Problem # 1:  HYPERLIPIDEMIA (ICD-272.4) Assessment  Improved On the regimen of medicine(s) reflected in the chart    Problem # 2:  VITAMIN B12 DEFICIENCY (ICD-266.2) Assessment: Improved On the regimen of medicine(s) reflected in the chart    Problem # 3:  DIABETES MELLITUS, TYPE II (ICD-250.00) Assessment: Unchanged  Her updated medication list for this problem includes:    Glucophage Xr 500 Mg Xr24h-tab (Metformin hcl) .Marland Kitchen... 1 by mouth tid    Aspirin 81 Mg Tbec (Aspirin) .Marland Kitchen... 1 by mouth qd  Problem # 4:   ANXIETY (ICD-300.00) Assessment: Unchanged  Her updated medication list for this problem includes:    Lorazepam 2 Mg Tabs (Lorazepam) .Marland Kitchen... Three times a day    Trazodone Hcl 50 Mg Tabs (Trazodone hcl) .Marland Kitchen... 1-2 by mouth at bedtime  Problem # 5:  WEIGHT LOSS (ICD-783.21) Assessment: Deteriorated Eat more. Diet discussed  Complete Medication List: 1)  Ambien 10 Mg Tabs (Zolpidem tartrate) .Marland Kitchen.. 1 at bedtime as needed 2)  Lorazepam 2 Mg Tabs (Lorazepam) .... Three times a day 3)  Detrol La 4 Mg Cp24 (Tolterodine tartrate) .... At bedtime 4)  Fosamax 70 Mg Tabs (Alendronate sodium) .Marland KitchenMarland KitchenMarland Kitchen 1 weekly 5)  Kadian 60 Mg Xr24h-cap (Morphine sulfate) .... Two times a day 6)  Oxycodone-acetaminophen 10-325 Mg Tabs (Oxycodone-acetaminophen) .... Q6hr as needed 7)  Calcitriol 0.5 Mcg Caps (Calcitriol) .Marland Kitchen.. 1 bid 8)  Freestyle Lite Test Strp (Glucose blood) .... Qd 9)  Freestyle Unistick Ii Lancets Misc (Lancets) .... Qd 10)  Glucophage Xr 500 Mg Xr24h-tab (Metformin hcl) .Marland Kitchen.. 1 by mouth tid 11)  Vitamin D (ergocalciferol) 50000 Unit Caps (Ergocalciferol) .Marland Kitchen.. 1 by mouth weekly 12)  Trazodone Hcl 50 Mg Tabs (Trazodone hcl) .Marland Kitchen.. 1-2 by mouth at bedtime 13)  Vitamin B-12 500 Mcg Tabs (Cyanocobalamin) .Marland Kitchen.. 1 by mouth once daily for vitamin b12 deficiency 14)  Aspirin 81 Mg Tbec (Aspirin) .Marland Kitchen.. 1 by mouth qd  Patient Instructions: 1)  Please schedule a follow-up appointment in 3 months. 2)  BMP prior to visit, ICD-9: 3)  Lipid Panel prior to visit, ICD-9:250.00 4)  HbgA1C prior to visit, ICD-9: Prescriptions: GLUCOPHAGE XR 500 MG XR24H-TAB (METFORMIN HCL) 1 by mouth tid  #90 x 12   Entered and Authorized by:   Tresa Garter MD   Signed by:   Tresa Garter MD on 07/05/2010   Method used:   Print then Give to Patient   RxID:   [id number]

## 2010-11-02 NOTE — Miscellaneous (Signed)
Summary: Verbal order for PT/[facility]  Verbal order for PT/[facility]   Imported By: Sherian Rein 01/25/2009 12:41:00  _____________________________________________________________________  External Attachment:    Type:   Image     Comment:   External Document

## 2010-11-02 NOTE — Miscellaneous (Signed)
Summary: lorazepam  Clinical Lists Changes  Medications: Rx of LORAZEPAM 2 MG  TABS (LORAZEPAM) three times a day;  #90 x 5;  Signed;  Entered by: Tora Perches;  Authorized by: Tresa Garter MD;  Method used: [facility]oned to [facility] [address] [phone]*, [address], [place], [place]  [address], Ph: [phone] or [phone], Fax: [phone]    Prescriptions: LORAZEPAM 2 MG  TABS (LORAZEPAM) three times a day  #90 x 5   Entered by:   Tora Perches   Authorized by:   Tresa Garter MD   Signed by:   Tora Perches on 06/15/2009   Method used:   Telephoned to ...       [facility] [phone]* (retail)       [address]       [place], [place]  [address]       Ph: [phone] or [phone]       Fax: [phone]   RxID:   [id number]

## 2010-11-02 NOTE — Letter (Signed)
Summary: [facility]   Imported By: Lester [place] 10/28/2009 15:26:00  _____________________________________________________________________  External Attachment:    Type:   Image     Comment:   External Document

## 2010-11-02 NOTE — Miscellaneous (Signed)
Summary: zolpidem  Clinical Lists Changes  Medications: Rx of AMBIEN 10 MG  TABS (ZOLPIDEM TARTRATE) 1 at bedtime as needed;  #30 x 6;  Signed;  Entered by: Tora Perches;  Authorized by: Tresa Garter MD;  Method used: [facility]ed [facility] [address] [phone]*, [address], [place], [place]  [address], Ph: [phone] or [phone], Fax: [phone]    Prescriptions: AMBIEN 10 MG  TABS (ZOLPIDEM TARTRATE) 1 at bedtime as needed  #30 x 6   Entered by:   Tora Perches   Authorized by:   Tresa Garter MD   Signed by:   Tora Perches on 04/07/2008   Method used:   Faxed to ...       [facility] [id number]       [address]       [place], [place]  [address]       Ph: [phone] or [phone]       Fax: [phone]   RxID:   [id number]

## 2010-11-02 NOTE — Progress Notes (Signed)
Summary: Rf Lorazepam  Phone Note Refill Request Message from:  Fax from Pharmacy  Refills Requested: Medication #1:  LORAZEPAM 2 MG  TABS three times a day   Dosage confirmed as above?Dosage Confirmed   Supply Requested: 90   Last Refilled: 08/23/2010  Medication #2:  AMBIEN 10 MG  TABS 1 at bedtime as needed   Dosage confirmed as above?Dosage Confirmed   Supply Requested: 30   Last Refilled: 08/24/2010  Method Requested: Telephone to Pharmacy Next Appointment Scheduled: 10-11-10 Initial call taken by: Lanier Prude, [facility]),  September 22, 2010 11:32 AM  Follow-up for Phone Call        ok x 3 each Follow-up by: Tresa Garter MD,  September 22, 2010 1:18 PM    Prescriptions: LORAZEPAM 2 MG  TABS (LORAZEPAM) three times a day  #90 x 2   Entered and Authorized by:   Margaret Pyle, CMA   Signed by:   Margaret Pyle, CMA on 09/22/2010   Method used:   Telephoned to ...       [facility] [phone]* (retail)       [address]       [place], [place]  [address]       Ph: [phone] or [phone]       Fax: [phone]   RxID:   [id number] AMBIEN 10 MG  TABS (ZOLPIDEM TARTRATE) 1 at bedtime as needed  #30 x 2   Entered and Authorized by:   Margaret Pyle, CMA   Signed by:   Margaret Pyle, CMA on 09/22/2010   Method used:   Telephoned to ...       [facility] [address] [phone]* (retail)       [address]       [place], [place]  [address]       Ph: [phone] or [phone]       Fax: [phone]   RxID:   [id number]

## 2010-11-02 NOTE — Progress Notes (Signed)
Summary: CBGs  Phone Note Call from Patient   Summary of Call: Pt's caregiver called: She was unable to be at the last office visit. Pt's fasting cbgs have been around 190. She will continue to check cbgs & bring in for dr to review.   Initial call taken by: Lamar Sprinkles, CMA,  July 21, 2009 11:04 AM  Follow-up for Phone Call        If so, take Metformin 1 three times a day  Follow-up by: Tresa Garter MD,  July 21, 2009 1:04 PM  Additional Follow-up for Phone Call Additional follow up Details #1::        Last fasting cbgs:  133 143 170 159 133  145  Should patient change to three times a day? Or keep as two times a day?   Additional Follow-up by: Lamar Sprinkles, CMA,  July 26, 2009 8:40 AM    Additional Follow-up for Phone Call Additional follow up Details #2::    Noted. Thx Follow-up by: Tresa Garter MD,  July 26, 2009 11:05 AM  Additional Follow-up for Phone Call Additional follow up Details #3:: Details for Additional Follow-up Action Taken: Change med? Or keep at two times a day? ......................................Marland KitchenLamar Sprinkles, CMA  July 26, 2009 1:35 PM  Take 1  three times a day    left mess to call office back....................Marland KitchenLamar Sprinkles, CMA  July 28, 2009 8:58 AM   Pt Feliciana Rossetti, [place]  July 28, 2009 2:35 PM  Additional Follow-up by: Tresa Garter MD,  July 27, 2009 1:01 PM

## 2010-11-02 NOTE — Progress Notes (Signed)
Summary: REDNESS ON EAR  Phone Note Call from Patient Call back at [facility] Phone [phone]   Summary of Call: Pt has taken all her meds and c/o redness around area that cyst was removed. Please advise.  Initial call taken by: Lamar Sprinkles, CMA,  October 26, 2010 12:43 PM  Follow-up for Phone Call        We would need an ENT consult Follow-up by: Tresa Garter MD,  October 26, 2010 12:45 PM  Additional Follow-up for Phone Call Additional follow up Details #1::        Pt informed, scheduled for tomorrow Additional Follow-up by: Lamar Sprinkles, CMA,  October 26, 2010 3:32 PM

## 2010-11-02 NOTE — Miscellaneous (Signed)
Summary: DETROL  Clinical Lists Changes  Medications: Rx of DETROL LA 4 MG  CP24 (TOLTERODINE TARTRATE) at bedtime;  #30 x 6;  Signed;  Entered by: Tora Perches;  Authorized by: Tresa Garter MD;  Method used: [facility] [address] [phone]*, [address], [place], [place]  [address], Ph: [phone] or [phone], Fax: [phone]    Prescriptions: DETROL LA 4 MG  CP24 (TOLTERODINE TARTRATE) at bedtime  #30 x 6   Entered by:   Tora Perches   Authorized by:   Tresa Garter MD   Signed by:   Tora Perches on 06/08/2008   Method used:   Electronically to        [facility] [address] [phone]* (retail)       [address]       [place], [place]  [address]       Ph: [phone] or [phone]       Fax: [phone]   RxID:   [id number]

## 2010-11-02 NOTE — Progress Notes (Signed)
Summary: METFORMIN  Phone Note Call from Patient Call back at [facility] Phone [phone]   Summary of Call: At last office visit pt was told to stop metformin due to black stools. Labs are back and pt wants to know if she should continue medication. Initial call taken by: Lamar Sprinkles, CMA,  October 24, 2009 1:51 PM  Follow-up for Phone Call        Yes - pls cont. UTI - take cipro Follow-up by: Tresa Garter MD,  October 24, 2009 5:46 PM  Additional Follow-up for Phone Call Additional follow up Details #1::        Pt informed  Additional Follow-up by: Lamar Sprinkles, CMA,  October 24, 2009 5:58 PM    New/Updated Medications: CIPROFLOXACIN HCL 250 MG TABS (CIPROFLOXACIN HCL) 1 by mouth two times a day for cystitis GLUCOPHAGE XR 500 MG XR24H-TAB (METFORMIN HCL) 2 daily Prescriptions: CIPROFLOXACIN HCL 250 MG TABS (CIPROFLOXACIN HCL) 1 by mouth two times a day for cystitis  #10 x 0   Entered and Authorized by:   Tresa Garter MD   Signed by:   Lamar Sprinkles, CMA on 10/24/2009   Method used:   Electronically to        [facility] [address] [phone]* (retail)       [address]       [place], [place]  [address]       Ph: [phone] or [phone]       Fax: [phone]   RxID:   [id number]

## 2010-11-02 NOTE — Assessment & Plan Note (Signed)
Summary: 3 MO ROV /NWS  #   Vital Signs:  Patient profile:   60 year old female Height:      65 inches Weight:      113 pounds BMI:     18.87 Temp:     99.2 degrees F oral Pulse rate:   72 / minute Pulse rhythm:   regular Resp:     16 per minute BP sitting:   110 / 60  (left arm) Cuff size:   regular  Vitals Entered By: Lanier Prude, Beverly Gust) (October 11, 2010 2:39 PM)  Procedure Note  Incision & Drainage: The patient complains of redness, inflammation, tenderness, and swelling. Date of onset: 10/08/2010 Indication: infected lesion    Size (in cm): 0.3 x 0.3    Region: posterior    Location: R ear    Comment: Risks including but not limited by incomplete procedure, bleeding, infection, recurrence were discussed with the patient. Consent form was verbal.  0.3 cm incision was made.    0.2 cc of purulent material and fatty d/c was evacuated. Wound cavity was cleaned.  Cx obtained. Neosporin cream and bandaid. Tolerated well. Complicatons - none.     Instrument used: #10 blade  Cleaned and prepped with: alcohol and betadine Wound dressing: neosporin and bandaid Instructions: daily dressing changes  CC: 3 mo f/u c/o scratch/burning on back of Rt ear X 1 mo Is Patient Diabetic? Yes   Primary Care Provider:  Tresa Garter MD  CC:  3 mo f/u c/o scratch/burning on back of Rt ear X 1 mo.  History of Present Illness: The patient presents for a follow up of hypertension, diabetes, hyperlipidemia  C/o R ear pain after an accidental scratch a few days ago - painful and hot F/u wt loss  Current Medications (verified): 1)  Ambien 10 Mg  Tabs (Zolpidem Tartrate) .Marland Kitchen.. 1 At Bedtime As Needed 2)  Lorazepam 2 Mg  Tabs (Lorazepam) .... Three Times A Day 3)  Detrol La 4 Mg  Cp24 (Tolterodine Tartrate) .... At Bedtime 4)  Fosamax 70 Mg  Tabs (Alendronate Sodium) .Marland Kitchen.. 1 Weekly 5)  Kadian 60 Mg Xr24h-Cap (Morphine Sulfate) .... Two Times A Day 6)  Oxycodone-Acetaminophen 10-325  Mg Tabs (Oxycodone-Acetaminophen) .... Q6hr As Needed 7)  Calcitriol 0.5 Mcg Caps (Calcitriol) .Marland Kitchen.. 1 Bid 8)  Freestyle Lite Test  Strp (Glucose Blood) .... Qd 9)  Freestyle Unistick Ii Lancets  Misc (Lancets) .... Qd 10)  Glucophage Xr 500 Mg Xr24h-Tab (Metformin Hcl) .Marland Kitchen.. 1 By Mouth Tid 11)  Vitamin D (Ergocalciferol) 50000 Unit Caps (Ergocalciferol) .Marland Kitchen.. 1 By Mouth Weekly 12)  Trazodone Hcl 50 Mg Tabs (Trazodone Hcl) .Marland Kitchen.. 1-2 By Mouth At Bedtime 13)  Vitamin B-12 500 Mcg Tabs (Cyanocobalamin) .Marland Kitchen.. 1 By Mouth Once Daily For Vitamin B12 Deficiency 14)  Aspirin 81 Mg Tbec (Aspirin) .Marland Kitchen.. 1 By Mouth Qd  Allergies (verified): 1)  ! Fosamax (Alendronate Sodium) 2)  ! Prozac (Fluoxetine Hcl)  Past History:  Past Medical History: Last updated: 04/04/2010 Depression GERD Seizure disorder Gait disorder H/o anoxic brain injury Chronic neck and shoulder pain COPD Low back pain Osteoarthritis Diabetes mellitus, type II 2010 Anxiety Hyperlipidemia Vit B12 def 2011  Social History: Last updated: 06/08/2008 Occupation:disabled Married Current Smoker Alcohol use-no  Review of Systems       The patient complains of fever, difficulty walking, and depression.  The patient denies chest pain, dyspnea on exertion, and abdominal pain.    Physical Exam  General:  Thin, in no acute distress; alert,appropriate and cooperative throughout examination Eyes:  pink moist mm., no icterus Ears:  External ear exam shows two 4 mm lesions w/scabs on top one  lesion-abscess 4 mm on the inner part of the ear. The ear is warm and erythematous. Otoscopic examination reveals clear canals, tympanic membranes are intact bilaterally without bulging, retraction, inflammation or discharge. Hearing is grossly normal bilaterally. Mouth:  Oral mucosa and oropharynx without lesions or exudates.  Teeth in good repair. Neck:  supple, full ROM, no masses, no thyromegaly, no JVD, normal carotid upstroke, and no carotid  bruits.   Lungs:  normal respiratory effort, no intercostal retractions, no accessory muscle use, normal breath sounds, no dullness, no fremitus, no crackles, and no wheezes.   Heart:  normal rate, regular rhythm, no murmur, no gallop, no rub, and no JVD.   Abdomen:  soft, non-tender, no distention, no masses, no guarding, no rigidity, no rebound tenderness, no hepatomegaly, no splenomegaly, abdominal scar(s), and bowel sounds hyperactive.   Msk:  walks w/a walker weak LE Neurologic:  alert & oriented X3, abnormal gait, and ataxic gait.   Skin:  2 crusts on outside of R ear and one vesicle on the back Psych:  Oriented X3, good eye contact, and depressed appearing.     Impression & Recommendations:  Problem # 1:  SKIN RASH (ICD-782.1) - scratches w/scabs Assessment New  Orders: Specimen Handling ([address]) T-Culture, Wound (87070/[id number])  Problem # 2:  CELLULITIS AND ABSCESS OF OTHER SPECIFIED SITE [phone]) R ear Assessment: New  Her updated medication list for this problem includes:    Doxycycline Hyclate 100 Mg Caps (Doxycycline hyclate) .Marland Kitchen... 1 by mouth two times a day with a glass of water  Orders: I&D Abscess, Simple / Single (10060)  Problem # 3:  WEIGHT LOSS (ICD-783.21) Assessment: Improved  Problem # 4:  DIABETES MELLITUS, TYPE II (ICD-250.00) Assessment: Improved  Her updated medication list for this problem includes:    Glucophage Xr 500 Mg Xr24h-tab (Metformin hcl) .Marland Kitchen... 1 by mouth tid    Aspirin 81 Mg Tbec (Aspirin) .Marland Kitchen... 1 by mouth qd  Labs Reviewed: Creat: 0.4 (10/10/2010)    Reviewed HgBA1c results: 7.1 (10/10/2010)  7.3 (07/03/2010)  Problem # 5:  UNSTEADY GAIT (ICD-781.2) Assessment: Improved  Problem # 6:  VITAMIN B12 DEFICIENCY (ICD-266.2) Assessment: Improved On the regimen of medicine(s) reflected in the chart    Complete Medication List: 1)  Ambien 10 Mg Tabs (Zolpidem tartrate) .Marland Kitchen.. 1 at bedtime as needed 2)  Lorazepam 2 Mg Tabs  (Lorazepam) .... Three times a day 3)  Detrol La 4 Mg Cp24 (Tolterodine tartrate) .... At bedtime 4)  Fosamax 70 Mg Tabs (Alendronate sodium) .Marland KitchenMarland KitchenMarland Kitchen 1 weekly 5)  Kadian 60 Mg Xr24h-cap (Morphine sulfate) .... Two times a day 6)  Oxycodone-acetaminophen 10-325 Mg Tabs (Oxycodone-acetaminophen) .... Q6hr as needed 7)  Calcitriol 0.5 Mcg Caps (Calcitriol) .Marland Kitchen.. 1 bid 8)  Freestyle Lite Test Strp (Glucose blood) .... Qd 9)  Freestyle Unistick Ii Lancets Misc (Lancets) .... Qd 10)  Glucophage Xr 500 Mg Xr24h-tab (Metformin hcl) .Marland Kitchen.. 1 by mouth tid 11)  Vitamin D (ergocalciferol) 50000 Unit Caps (Ergocalciferol) .Marland Kitchen.. 1 by mouth weekly 12)  Trazodone Hcl 50 Mg Tabs (Trazodone hcl) .Marland Kitchen.. 1-2 by mouth at bedtime 13)  Vitamin B-12 500 Mcg Tabs (Cyanocobalamin) .Marland Kitchen.. 1 by mouth once daily for vitamin b12 deficiency 14)  Aspirin 81 Mg Tbec (Aspirin) .Marland Kitchen.. 1 by mouth qd 15)  Mupirocin 2 % Oint (  Mupirocin) .... Use two times a day w/dressing change 16)  Doxycycline Hyclate 100 Mg Caps (Doxycycline hyclate) .Marland Kitchen.. 1 by mouth two times a day with a glass of water  Patient Instructions: 1)  Please schedule a follow-up appointment in 3 months. 2)  BMP prior to visit, ICD-9: 3)  Hepatic Panel prior to visit, ICD-9:250.00 4)  HbgA1C prior to visit, ICD-9: Prescriptions: DOXYCYCLINE HYCLATE 100 MG CAPS (DOXYCYCLINE HYCLATE) 1 by mouth two times a day with a glass of water  #20 x 0   Entered and Authorized by:   Tresa Garter MD   Signed by:   Tresa Garter MD on 10/11/2010   Method used:   [facility] to        [facility] [address] [phone]* (retail)       [address]       [place], [place]  [address]       Ph: [phone] or [phone]       Fax: [phone]   RxID:   [id number] MUPIROCIN 2 % OINT (MUPIROCIN) use two times a day w/dressing change  #30 g x 0   Entered and Authorized by:   Tresa Garter MD   Signed by:   Tresa Garter MD on 10/11/2010   Method used:    [facility]ically to        [facility] [phone]* (retail)       [address]       [place], [place]  [address]       Ph: [phone] or [phone]       Fax: [phone]   RxID:   [id number]    Orders Added: 1)  Specimen Handling [99000] 2)  T-Culture, Wound [87070/[id number]] 3)  Est. Patient Level IV [37628] 4)  I&D Abscess, Simple / Single [10060]

## 2010-11-02 NOTE — Medication Information (Signed)
Summary: Approved/[facility]  Approved/[facility]   Imported By: Lester [place] 11/09/2009 11:00:23  _____________________________________________________________________  External Attachment:    Type:   Image     Comment:   External Document

## 2010-11-02 NOTE — Miscellaneous (Signed)
Summary: [place] 2 form/[place] [facility]  [place] [facility]   Imported By: Sherian Rein 06/02/2010 14:04:18  _____________________________________________________________________  External Attachment:    Type:   Image     Comment:   External Document

## 2010-11-02 NOTE — Progress Notes (Signed)
Summary: REFILL  Phone Note Refill Request   Refills Requested: Medication #1:  LORAZEPAM 2 MG  TABS three times a day Initial call taken by: Lamar Sprinkles,  June 28, 2008 9:18 AM  Follow-up for Phone Call        OK 6 ref if time Follow-up by: Tresa Garter MD,  June 28, 2008 12:49 PM      Prescriptions: LORAZEPAM 2 MG  TABS (LORAZEPAM) three times a day  #90 x 5   Entered by:   Lamar Sprinkles   Authorized by:   Tresa Garter MD   Signed by:   Lamar Sprinkles on 06/28/2008   Method used:   Telephoned to ...       [facility] [address] [phone]* (retail)       [address]       [place], [place]  [address]       Ph: [phone] or [phone]       Fax: [phone]   RxID:   [id number]

## 2010-11-02 NOTE — Miscellaneous (Signed)
Summary: detrol la  Clinical Lists Changes  Medications: Rx of DETROL LA 4 MG  CP24 (TOLTERODINE TARTRATE) at bedtime;  #30 x 6;  Signed;  Entered by: Orlan Leavens;  Authorized by: Tresa Garter MD;  Method used: [facility]phoned to [facility] [address] [phone]*, [address] [place], [place]  [address], Ph: [phone] or [phone], Fax: [phone]    Prescriptions: DETROL LA 4 MG  CP24 (TOLTERODINE TARTRATE) at bedtime  #30 x 6   Entered by:   Orlan Leavens   Authorized by:   Tresa Garter MD   Signed by:   Orlan Leavens on 11/05/2007   Method used:   Telephoned to ...       [id number]       [address]       [place], [place]  [address]       Ph: [phone] or [phone]       Fax: [phone]   RxID:   [id number]

## 2010-11-02 NOTE — Assessment & Plan Note (Signed)
Summary: per phone note ok,elev temp,wheezing/$50/cd   Vital Signs:  Patient profile:   60 year old female Height:      65 inches Weight:      137 pounds BMI:     22.88 Temp:     99 degrees F oral Pulse rate:   109 / minute BP sitting:   116 / 82  (left arm)  Vitals Entered By: Tora Perches (January 13, 2009 10:38 AM) Is Patient Diabetic? No   History of Present Illness: rash X 1 week, itchy, before it was burning, started with a few little dots, also complains of runny nose and coughting with clear sputum (goowy), denoes sob and chest pain, low grade fever for a few days, nausea and vomiting,  Wants to stop smoking  Allergies: 1)  ! Prevacid (Lansoprazole) 2)  ! Fosamax (Alendronate Sodium) 3)  ! Prozac (Fluoxetine Hcl)  Past History:  Past Medical History:    Depression    GERD    Seizure disorder     (02/26/2007)  Social History:    Occupation:disabled    Married    Current Smoker    Alcohol use-no     (06/08/2008)  Physical Exam  General:  no acute distress; alert,appropriate and cooperative throughout examination Mouth:  Oral mucosa and oropharynx without lesions or exudates.  Teeth in good repair. Neck:  A 3x5 cm erythematous patch with dry 1 mm vesicles, NT Lungs:  Normal respiratory effort, chest expands symmetrically. Lungs are clear to auscultation, no crackles or wheezes. Heart:  Normal rate and regular rhythm. S1 and S2 normal without gallop, murmur, click, rub or other extra sounds. Abdomen:  Bowel sounds positive,abdomen soft and non-tender without masses, organomegaly or hernias noted.   Impression & Recommendations:  Problem # 1:  RASH AND OTHER NONSPECIFIC SKIN ERUPTION (ICD-782.1) - poss contact dermatitis Assessment Comment Only  Her updated medication list for this problem includes:    Topicort 0.25 % Crea (Desoximetasone) ..... Use two times a day - tid  Problem # 2:  TOBACCO USER (ICD-305.1) Assessment: Comment Only The office visit  took longer than 20 min with patient councelling for more than 50% of the 20 min   Her updated medication list for this problem includes:    Chantix Starting Month Pak 0.5 Mg X 11 & 1 Mg X 42 Misc (Varenicline tartrate) .Marland Kitchen... 0.5mg  by mouth once daily for 3 days, then twice daily for 4 days and then 1mg  by mouth 2 times daily    Chantix 1 Mg Tabs (Varenicline tartrate) .Marland Kitchen... 1 tab by mouth 2 times daily  Encouraged smoking cessation and discussed different methods for smoking cessation.   Complete Medication List: 1)  Ambien 10 Mg Tabs (Zolpidem tartrate) .Marland Kitchen.. 1 at bedtime as needed 2)  Lorazepam 2 Mg Tabs (Lorazepam) .... Three times a day 3)  Detrol La 4 Mg Cp24 (Tolterodine tartrate) .... At bedtime 4)  Fosamax 70 Mg Tabs (Alendronate sodium) .Marland KitchenMarland KitchenMarland Kitchen 1 weekly 5)  Kadian 80 Mg Xr24h-cap (Morphine sulfate) .... Two times a day 6)  Oxycodone-acetaminophen 10-325 Mg Tabs (Oxycodone-acetaminophen) .... Q6hr as needed 7)  Calcitriol 0.5 Mcg Caps (Calcitriol) .Marland Kitchen.. 1 bid 8)  Vitamin D 1000 Unit Tabs (Cholecalciferol) .... Once daily 9)  Topicort 0.25 % Crea (Desoximetasone) .... Use two times a day - tid 10)  Chantix Starting Month Pak 0.5 Mg X 11 & 1 Mg X 42 Misc (Varenicline tartrate) .... 0.5mg  by mouth once daily for 3 days, then twice  daily for 4 days and then 1mg  by mouth 2 times daily 11)  Chantix 1 Mg Tabs (Varenicline tartrate) .Marland Kitchen.. 1 tab by mouth 2 times daily  Patient Instructions: 1)  Call if you are not better in a reasonable ammount of time or if worse.  Prescriptions: CHANTIX 1 MG TABS (VARENICLINE TARTRATE) 1 tab by mouth 2 times daily  #60 x 3   Entered and Authorized by:   Tresa Garter MD   Signed by:   Tresa Garter MD on 01/13/2009   Method used:   Print then Give to Patient   RxID:   [id number] CHANTIX STARTING MONTH PAK 0.5 MG X 11 & 1 MG X 42  MISC (VARENICLINE TARTRATE) 0.5mg  by mouth once daily for 3 days, then twice daily for 4 days and then 1mg  by  mouth 2 times daily  #1 pack x 0   Entered and Authorized by:   Tresa Garter MD   Signed by:   Tresa Garter MD on 01/13/2009   Method used:   Print then Give to Patient   RxID:   [id number] TOPICORT 0.25 % CREA (DESOXIMETASONE) use two times a day - tid  #45  g x 1   Entered and Authorized by:   Tresa Garter MD   Signed by:   Tresa Garter MD on 01/13/2009   Method used:   [facility] to        [facility] [address] [phone]* (retail)       [address]       [place], [place]  [address]       Ph: [phone] or [phone]       Fax: [phone]   RxID:   [id number]

## 2010-11-02 NOTE — Progress Notes (Signed)
  Phone Note Call from Patient   Summary of Call: Pt called and is concerned with BM (Black stool)? Is the previous Metformin the cause of the "black stool". lPlease advise? Initial call taken by: Josph Macho CMA,  October 20, 2009 10:50 AM  Follow-up for Phone Call        No. It could be Peptobismol, iron or blood. OV iv sick Follow-up by: Tresa Garter MD,  October 20, 2009 12:50 PM  Additional Follow-up for Phone Call Additional follow up Details #1::        Informed pt and transferred call to [facility] to schedule appt. Additional Follow-up by: Josph Macho CMA,  October 20, 2009 4:19 PM

## 2010-11-02 NOTE — Assessment & Plan Note (Signed)
Summary: 2 MTH FU STC   Vital Signs:  Patient profile:   60 year old female Weight:      122 pounds Temp:     97.5 degrees F oral Pulse rate:   94 / minute BP sitting:   126 / 80  (left arm)  Vitals Entered By: Tora Perches (September 08, 2009 10:22 AM) CC: f/u Is Patient Diabetic? Yes   CC:  f/u.  History of Present Illness: The patient presents for a follow up of gait issues, diabetes, insomnia CBGs 140-180 at home   Current Medications (verified): 1)  Ambien 10 Mg  Tabs (Zolpidem Tartrate) .Marland Kitchen.. 1 At Bedtime As Needed 2)  Lorazepam 2 Mg  Tabs (Lorazepam) .... Three Times A Day 3)  Detrol La 4 Mg  Cp24 (Tolterodine Tartrate) .... At Bedtime 4)  Fosamax 70 Mg  Tabs (Alendronate Sodium) .Marland Kitchen.. 1 Weekly 5)  Kadian 60 Mg Xr24h-Cap (Morphine Sulfate) .... Two Times A Day 6)  Oxycodone-Acetaminophen 10-325 Mg Tabs (Oxycodone-Acetaminophen) .... Q6hr As Needed 7)  Calcitriol 0.5 Mcg Caps (Calcitriol) .Marland Kitchen.. 1 Bid 8)  Vitamin D 1000 Unit Tabs (Cholecalciferol) .... Once Daily 9)  Freestyle Lite Test  Strp (Glucose Blood) .... Qd 10)  Freestyle Unistick Ii Lancets  Misc (Lancets) .... Qd 11)  Glucophage 500 Mg Tabs (Metformin Hcl) .Marland Kitchen.. 1 By Mouth Bid  Allergies: 1)  ! Prevacid (Lansoprazole) 2)  ! Fosamax (Alendronate Sodium) 3)  ! Prozac (Fluoxetine Hcl)  Past History:  Past Medical History: Last updated: 07/07/2009 Depression GERD Seizure disorder Gait disorder H/o anoxic brain injury Chronic neck and shoulder pain COPD Low back pain Osteoarthritis Diabetes mellitus, type II 2010  Social History: Last updated: 06/08/2008 Occupation:disabled Married Current Smoker Alcohol use-no  Review of Systems  The patient denies syncope and prolonged cough.    Physical Exam  General:  no acute distress; alert,appropriate and cooperative throughout examination Nose:  External nasal examination shows no deformity or inflammation. Nasal mucosa are pink and moist without  lesions or exudates. Mouth:  Oral mucosa and oropharynx without lesions or exudates.  Teeth in good repair. Lungs:  Normal respiratory effort, chest expands symmetrically. Lungs are clear to auscultation, no crackles or wheezes. Heart:  Normal rate and regular rhythm. S1 and S2 normal without gallop, murmur, click, rub or other extra sounds. Abdomen:  Bowel sounds positive,abdomen soft and non-tender without masses, organomegaly or hernias noted. Msk:  Using a walker, small ataxic steps Extremities:  No edema Neurologic:  Ataxia Skin:  Clear Psych:  Oriented X3.  normally interactive and good eye contact.  tearful.     Impression & Recommendations:  Problem # 1:  DIABETES MELLITUS, TYPE II (ICD-250.00) Assessment Comment Only  Her updated medication list for this problem includes:    Glucophage 500 Mg Tabs (Metformin hcl) .Marland Kitchen... 1 by mouth bid  Problem # 2:  UNSTEADY GAIT (ICD-781.2) Assessment: Improved  Problem # 3:  INSOMNIA (ICD-780.52) Assessment: Unchanged  Her updated medication list for this problem includes:    Ambien 10 Mg Tabs (Zolpidem tartrate) .Marland Kitchen... 1 at bedtime as needed  Complete Medication List: 1)  Ambien 10 Mg Tabs (Zolpidem tartrate) .Marland Kitchen.. 1 at bedtime as needed 2)  Lorazepam 2 Mg Tabs (Lorazepam) .... Three times a day 3)  Detrol La 4 Mg Cp24 (Tolterodine tartrate) .... At bedtime 4)  Fosamax 70 Mg Tabs (Alendronate sodium) .Marland KitchenMarland KitchenMarland Kitchen 1 weekly 5)  Kadian 60 Mg Xr24h-cap (Morphine sulfate) .... Two times a day 6)  Oxycodone-acetaminophen 10-325 Mg Tabs (Oxycodone-acetaminophen) .... Q6hr as needed 7)  Calcitriol 0.5 Mcg Caps (Calcitriol) .Marland Kitchen.. 1 bid 8)  Vitamin D 1000 Unit Tabs (Cholecalciferol) .... Once daily 9)  Freestyle Lite Test Strp (Glucose blood) .... Qd 10)  Freestyle Unistick Ii Lancets Misc (Lancets) .... Qd 11)  Glucophage 500 Mg Tabs (Metformin hcl) .Marland Kitchen.. 1 by mouth bid  Patient Instructions: 1)  Please schedule a follow-up appointment in 2  months. 2)  BMP prior to visit, ICD-9: 3)  HbgA1C prior to visit, ICD-9:250.00 Prescriptions: AMBIEN 10 MG  TABS (ZOLPIDEM TARTRATE) 1 at bedtime as needed  #30 x 5   Entered and Authorized by:   Tresa Garter MD   Signed by:   Tresa Garter MD on 09/08/2009   Method used:   Print then Give to Patient   RxID:   [id number]

## 2010-11-02 NOTE — Medication Information (Signed)
Summary: [facility]   Imported By: Lester  11/19/2009 09:32:57  _____________________________________________________________________  External Attachment:    Type:   Image     Comment:   External Document

## 2010-11-02 NOTE — Letter (Signed)
Summary: Request for Spinal Orthosis-Back Support Lumbar Brace/[facility]   Request for Spinal Orthosis-Back Support Lumbar Brace/[facility]   Imported By: Sherian Rein 08/04/2009 13:50:37  _____________________________________________________________________  External Attachment:    Type:   Image     Comment:   External Document

## 2010-11-02 NOTE — Progress Notes (Signed)
Summary: Diabetic supply co  Phone Note Other Incoming   Caller: [facility] Summary of Call: Chestine Spore called requesting an order from MD for pt Diabetic supply. Chestine Spore is requesting a call back to [phone] ref # [id number] Initial call taken by: Margaret Pyle, CMA,  July 11, 2009 12:03 PM  Follow-up for Phone Call        Order recieved and is in Dr's red folder for completion Follow-up by: Lamar Sprinkles, CMA,  July 13, 2009 7:54 AM

## 2010-11-02 NOTE — Assessment & Plan Note (Signed)
Summary: POSSIBLE HERNIA--PER SARAH SCHED-$50-STC   Vital Signs:  Patient Profile:   60 Years Old Female Temp:     97.6 degrees F oral Pulse rate:   106 / minute BP sitting:   126 / 86  (right arm)  Vitals Entered By: Doristine Devoid (January 17, 2008 10:04 AM)                 Chief Complaint:  HERNIA?Marland Kitchen  History of Present Illness: Here with her husband for intermittent epigastric abdominal pains. 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 Had done well until the past week when she began to have a well localized sharp pain at the old surgical site. This hurts worse when she moves around. It is not affected by eating food at all. Appetite is good, no fever or nausea. BM's regular.    Current Allergies: ! PREVACID (LANSOPRAZOLE) ! FOSAMAX (ALENDRONATE SODIUM) ! PROZAC (FLUOXETINE HCL)  Past Medical History:    Reviewed history from 02/26/2007 and no changes required:       Depression       GERD       Seizure disorder  Past Surgical History:    Reviewed history from 02/26/2007 and no changes required:       PART PANCREATECTOMY       incisional hernia repair     Review of Systems      See HPI   Physical Exam  General:     Well-developed,well-nourished,in no acute distress; alert,appropriate and cooperative throughout examination. Walks with a walker. Requires a lot of help to get on the exam table. Abdomen:     soft, normal bowel sounds, no distention, no guarding, no rigidity, no rebound tenderness, no hepatomegaly, and no splenomegaly.  Mildly tender over the epigastrium which has old surgical scars. A small reducible hernia can be felt along this scar.    Impression & Recommendations:  Problem # 1:  HERNIA, INCISIONAL (ICD-553.21)  Orders: Surgical Referral (Surgery)   Complete Medication List: 1)  Ambien Cr 12.5 Mg Tbcr (Zolpidem tartrate) .... Take 1 tablet by mouth every  night 2)  Ambien 10 Mg Tabs (Zolpidem tartrate) .Marland Kitchen.. 1 at bedtime as needed 3)  Lorazepam 2 Mg Tabs (Lorazepam) .... Three times a day 4)  Detrol La 4 Mg Cp24 (Tolterodine tartrate) .... At bedtime 5)  Prevacid 30 Mg Cpdr (Lansoprazole) .... Once daily 6)  Senokot 8.6 Mg Tabs (Sennosides) .... Once daily  as needed 7)  Fosamax 70 Mg Tabs (Alendronate sodium) .Marland KitchenMarland KitchenMarland Kitchen 1 weekly   Patient Instructions: 1)  Refer to Surgery next week. Rest this weekend.    ]

## 2010-11-02 NOTE — Progress Notes (Signed)
Summary: cbg update  Phone Note Call from Patient   Summary of Call: Pt called with cbg update. Sunday am 184, 30 min later 268. Monday am 163, 30 min later 204.  Initial call taken by: Lamar Sprinkles, CMA,  June 27, 2009 11:35 AM  Follow-up for Phone Call        Noted. If CBG are >200 a lot - take Metformin 1 two times a day  Follow-up by: Tresa Garter MD,  June 27, 2009 12:31 PM  Additional Follow-up for Phone Call Additional follow up Details #1::        Pt informed  Additional Follow-up by: Lamar Sprinkles, CMA,  June 27, 2009 5:29 PM

## 2010-11-02 NOTE — Letter (Signed)
Summary: Generic Letter  [facility]  [address] [place], [place] [address]   Phone: [phone]  Fax: [phone]    10/26/2009  RE: [name] [address] [place], [place]  [address]      [name],  Please, [name]    from jury duty on  11/02/2009  , juror number [phone]         due to medical reasons.        Sincerely,   Jacinta Shoe MD

## 2010-11-02 NOTE — Miscellaneous (Signed)
Summary: Plan of Care/[facility]  Plan of Care/[facility]   Imported By: Sherian Rein 01/07/2009 11:29:55  _____________________________________________________________________  External Attachment:    Type:   Image     Comment:   External Document

## 2010-11-02 NOTE — Assessment & Plan Note (Signed)
Summary: PER PT 2 WK FU  #--S-TC   Vital Signs:  Patient profile:   60 year old female Weight:      130 pounds Temp:     99.8 degrees F oral Pulse rate:   102 / minute BP sitting:   140 / 80  (left arm)  Vitals Entered By: Tora Perches (July 07, 2009 10:32 AM) CC: f/u Is Patient Diabetic? No   CC:  f/u.  History of Present Illness: The patient presents for a follow up of new diabetes   Allergies: 1)  ! Prevacid (Lansoprazole) 2)  ! Fosamax (Alendronate Sodium) 3)  ! Prozac (Fluoxetine Hcl)  Past History:  Social History: Last updated: 06/08/2008 Occupation:disabled Married Current Smoker Alcohol use-no  Past Medical History: Depression GERD Seizure disorder Gait disorder H/o anoxic brain injury Chronic neck and shoulder pain COPD Low back pain Osteoarthritis Diabetes mellitus, type II 2010  Physical Exam  General:  no acute distress; alert,appropriate and cooperative throughout examination Lungs:  Normal respiratory effort, chest expands symmetrically. Lungs are clear to auscultation, no crackles or wheezes. Heart:  Normal rate and regular rhythm. S1 and S2 normal without gallop, murmur, click, rub or other extra sounds. Abdomen:  Bowel sounds positive,abdomen soft and non-tender without masses, organomegaly or hernias noted. Msk:  Using a walker, small ataxic steps Neurologic:  Ataxia Skin:  Clear Psych:  Oriented X3.  normally interactive and good eye contact.  tearful.     Impression & Recommendations:  Problem # 1:  DIABETES MELLITUS, TYPE II (ICD-250.00) Assessment New  Her updated medication list for this problem includes:    Glucophage 500 Mg Tabs (Metformin hcl) .Marland Kitchen... 1 by mouth bid The office visit took longer than 20 min with patient councelling for more than 50% of the 20 min   Orders: Diabetic Clinic Referral (Diabetic)  Problem # 2:  COPD (ICD-496) Assessment: Unchanged Smoke less!  Problem # 3:  UNSTEADY GAIT  (ICD-781.2) Assessment: Unchanged Stretching more  Complete Medication List: 1)  Ambien 10 Mg Tabs (Zolpidem tartrate) .Marland Kitchen.. 1 at bedtime as needed 2)  Lorazepam 2 Mg Tabs (Lorazepam) .... Three times a day 3)  Detrol La 4 Mg Cp24 (Tolterodine tartrate) .... At bedtime 4)  Fosamax 70 Mg Tabs (Alendronate sodium) .Marland KitchenMarland KitchenMarland Kitchen 1 weekly 5)  Kadian 60 Mg Xr24h-cap (Morphine sulfate) .... Two times a day 6)  Oxycodone-acetaminophen 10-325 Mg Tabs (Oxycodone-acetaminophen) .... Q6hr as needed 7)  Calcitriol 0.5 Mcg Caps (Calcitriol) .Marland Kitchen.. 1 bid 8)  Vitamin D 1000 Unit Tabs (Cholecalciferol) .... Once daily 9)  Freestyle Lite Test Strp (Glucose blood) .... Qd 10)  Freestyle Unistick Ii Lancets Misc (Lancets) .... Qd 11)  Glucophage 500 Mg Tabs (Metformin hcl) .Marland Kitchen.. 1 by mouth bid  Patient Instructions: 1)  Try to eat more raw plant food, fresh and dry fruit, raw almonds, leafy vegetables, whole foods and less red meat, less animal fat. Poultry and fish is better for you than pork and beef. Avoid processed foods (canned soups, hot dogs, sausage, bacon , frozen dinners). Avoid corn syrup, high fructose syrup or aspartam and Splenda  containing drinks. Honey, Agave and Stevia are better sweeteners. Make your own  dressing with olive oil, wine vinegar, lemon juce, garlic etc. for your salads. 2)  Please schedule a follow-up appointment in 2 months. 3)  BMP prior to visit, ICD-9: 4)  Hepatic Panel prior to visit, ICD-9: 5)  Lipid Panel prior to visit, ICD-9: 250.00 995.20 6)  HbgA1C prior  to visit, ICD-9: Prescriptions: FREESTYLE LITE TEST  STRP (GLUCOSE BLOOD) qd  #100 x 5   Entered and Authorized by:   Tresa Garter MD   Signed by:   Tresa Garter MD on 07/07/2009   Method used:   Print then Give to Patient   RxID:   [id number] FREESTYLE UNISTICK II LANCETS  MISC (LANCETS) qd  #100 x 5   Entered and Authorized by:   Tresa Garter MD   Signed by:   Tresa Garter MD on  07/07/2009   Method used:   Print then Give to Patient   RxID:   [id number] GLUCOPHAGE 500 MG TABS (METFORMIN HCL) 1 by mouth bid  #60 x 12   Entered and Authorized by:   Tresa Garter MD   Signed by:   Tresa Garter MD on 07/07/2009   Method used:   Print then Give to Patient   RxID:   [id number]

## 2010-11-02 NOTE — Letter (Signed)
Summary: CMN/[facility]  CMN/[facility]   Imported By: Lester  02/28/2010 07:33:49  _____________________________________________________________________  External Attachment:    Type:   Image     Comment:   External Document

## 2010-11-02 NOTE — Miscellaneous (Signed)
Summary: HH cert 29/56/2-1/30/8   Clinical Lists Changes  Orders: Added new Service order of Re-certification of Home Health [phone]) - Signed

## 2010-11-02 NOTE — Medication Information (Signed)
Summary: Diabetes Supplies/[facility]  Diabetes Supplies/[facility]   Imported By: Sherian Rein 05/15/2010 11:47:47  _____________________________________________________________________  External Attachment:    Type:   Image     Comment:   External Document

## 2010-11-02 NOTE — Assessment & Plan Note (Signed)
Summary: BLACK WHEN WIPE--DR AVP PT-PER CHRISTY SCHED-STC   Vital Signs:  Patient profile:   60 year old female Height:      65 inches Weight:      118 pounds BMI:     19.71 O2 Sat:      92 % on Room air Temp:     99.2 degrees F oral Pulse rate:   80 / minute Pulse rhythm:   regular Resp:     16 per minute BP sitting:   118 / 72  (left arm) Cuff size:   regular  Vitals Entered By: Rock Nephew CMA (October 21, 2009 1:11 PM)  O2 Flow:  Room air CC: black stool, Abdominal Pain Is Patient Diabetic? Yes Did you bring your meter with you today? No   Primary Care Provider:  Tresa Garter MD  CC:  black stool and Abdominal Pain.  History of Present Illness: New to me she complains of black stool on TP for two weeks that she thinks has been caused by Metformin. SHe has also lost weight (132 ----> 118) over a 2 month period.  She says she only feels febrile when she comes in for an OV.  Dyspepsia History:      The patient has positive alarm features of dyspepsia which include involuntary weight loss >5%.  There is a prior history of GERD.  The patient does not have a prior history of documented ulcer disease.  An H-2 blocker medication is not currently being taken.     Preventive Screening-Counseling & Management  Alcohol-Tobacco     Alcohol drinks/day: <1     Alcohol type: wine     >5/day in last 3 mos: no     Alcohol Counseling: not indicated; use of alcohol is not excessive or problematic     Feels need to cut down: no     Feels annoyed by complaints: no     Feels guilty re: drinking: no     Needs 'eye opener' in am: no     Smoking Status: current     Smoking Cessation Counseling: yes     Smoke Cessation Stage: precontemplative     Packs/Day: 1     Pack years: 30  Current Medications (verified): 1)  Ambien 10 Mg  Tabs (Zolpidem Tartrate) .Marland Kitchen.. 1 At Bedtime As Needed 2)  Lorazepam 2 Mg  Tabs (Lorazepam) .... Three Times A Day 3)  Detrol La 4 Mg  Cp24  (Tolterodine Tartrate) .... At Bedtime 4)  Fosamax 70 Mg  Tabs (Alendronate Sodium) .Marland Kitchen.. 1 Weekly 5)  Kadian 60 Mg Xr24h-Cap (Morphine Sulfate) .... Two Times A Day 6)  Oxycodone-Acetaminophen 10-325 Mg Tabs (Oxycodone-Acetaminophen) .... Q6hr As Needed 7)  Calcitriol 0.5 Mcg Caps (Calcitriol) .Marland Kitchen.. 1 Bid 8)  Vitamin D 1000 Unit Tabs (Cholecalciferol) .... Once Daily 9)  Freestyle Lite Test  Strp (Glucose Blood) .... Qd 10)  Freestyle Unistick Ii Lancets  Misc (Lancets) .... Qd 11)  Glucophage Xr 500 Mg Xr24h-Tab (Metformin Hcl) .... 2 Once Daily  Allergies (verified): 1)  ! Fosamax (Alendronate Sodium) 2)  ! Prozac (Fluoxetine Hcl)  Past History:  Past Medical History: Reviewed history from 07/07/2009 and no changes required. Depression GERD Seizure disorder Gait disorder H/o anoxic brain injury Chronic neck and shoulder pain COPD Low back pain Osteoarthritis Diabetes mellitus, type II 2010  Past Surgical History: Reviewed history from 01/17/2008 and no changes required. PART PANCREATECTOMY incisional hernia repair  Family History: Reviewed history from 06/08/2008  and no changes required. Family History Hypertension  Social History: Reviewed history from 06/08/2008 and no changes required. Occupation:disabled Married Current Smoker Alcohol use-no  Review of Systems       The patient complains of weight loss.  The patient denies chest pain, abdominal pain, hematochezia, severe indigestion/heartburn, hematuria, abnormal bleeding, enlarged lymph nodes, peripheral edema, prolonged cough, headaches, hemoptysis, melena, suspicious skin lesions, and angioedema.   General:  Complains of fever and weight loss; denies chills, fatigue, loss of appetite, malaise, sleep disorder, sweats, and weakness. Resp:  Denies chest pain with inspiration, cough, coughing up blood, pleuritic, shortness of breath, sputum productive, and wheezing. Heme:  Denies abnormal bruising, bleeding,  enlarge lymph nodes, fevers, pallor, and skin discoloration.  Physical Exam  General:  no acute distress; alert,appropriate and cooperative throughout examination Eyes:  pink moist mm., no icterus Mouth:  Oral mucosa and oropharynx without lesions or exudates.  Teeth in good repair. Neck:  supple, full ROM, no masses, no thyromegaly, no JVD, normal carotid upstroke, and no carotid bruits.   Lungs:  normal respiratory effort, no intercostal retractions, no accessory muscle use, normal breath sounds, no dullness, no fremitus, no crackles, and no wheezes.   Heart:  normal rate, regular rhythm, no murmur, no gallop, no rub, and no JVD.   Abdomen:  soft, non-tender, no distention, no masses, no guarding, no rigidity, no rebound tenderness, no hepatomegaly, no splenomegaly, abdominal scar(s), and bowel sounds hyperactive.   Rectal:  No external abnormalities noted. Normal sphincter tone. No rectal masses or tenderness. heme negative/ brown stool in RV. no impaction. Msk:  normal ROM, no joint tenderness, no joint swelling, no joint warmth, no redness over joints, and no joint deformities.   Pulses:  R and L carotid,radial,femoral,dorsalis pedis and posterior tibial pulses are full and equal bilaterally Extremities:  No clubbing, cyanosis, edema, or deformity noted with normal full range of motion of all joints.   Neurologic:  No cranial nerve deficits noted. Station and gait are normal. Plantar reflexes are down-going bilaterally. DTRs are symmetrical throughout. Sensory, motor and coordinative functions appear intact. Skin:  turgor normal, color normal, no rashes, no suspicious lesions, no ecchymoses, no petechiae, no purpura, no ulcerations, and no edema.   Cervical Nodes:  no anterior cervical adenopathy and no posterior cervical adenopathy.   Axillary Nodes:  no R axillary adenopathy and no L axillary adenopathy.   Inguinal Nodes:  no R inguinal adenopathy and no L inguinal adenopathy.   Psych:   Oriented X3, memory intact for recent and remote, normally interactive, good eye contact, not anxious appearing, not depressed appearing, and not agitated.    Diabetes Management Exam:    Foot Exam (with socks and/or shoes not present):       Sensory-Pinprick/Light touch:          Left medial foot (L-4): normal          Left dorsal foot (L-5): normal          Left lateral foot (S-1): normal          Right medial foot (L-4): normal          Right dorsal foot (L-5): normal          Right lateral foot (S-1): normal       Sensory-Monofilament:          Left foot: normal          Right foot: normal       Inspection:  Left foot: normal          Right foot: normal       Nails:          Left foot: normal          Right foot: normal   Impression & Recommendations:  Problem # 1:  MELENA (ICD-578.1) Assessment New she says that metformin has caused this, she has not taken iron tabs, bismuth, or foods that would cause black stools. she has no other GI symptoms and she is heme neg today woth brown stool. so will check labs and follow closely. Orders: Venipuncture (16109) TLB-BMP (Basic Metabolic Panel-BMET) (80048-METABOL) TLB-CBC Platelet - w/Differential (85025-CBCD) TLB-Hepatic/Liver Function Pnl (80076-HEPATIC) TLB-TSH (Thyroid Stimulating Hormone) (84443-TSH) TLB-A1C / Hgb A1C (Glycohemoglobin) (83036-A1C) TLB-PTT (85730-PTTL) TLB-PT (Protime) (85610-PTP) TLB-Udip w/ Micro (81001-URINE) T-Vitamin D (25-Hydroxy) (60454-09811) Hemoccult Guaiac-1 spec.(in office) (82270)  Problem # 2:  UNSPECIFIED VITAMIN D DEFICIENCY (ICD-268.9) Assessment: Unchanged  Orders: Venipuncture (91478) TLB-BMP (Basic Metabolic Panel-BMET) (80048-METABOL) TLB-CBC Platelet - w/Differential (85025-CBCD) TLB-Hepatic/Liver Function Pnl (80076-HEPATIC) TLB-TSH (Thyroid Stimulating Hormone) (84443-TSH) TLB-A1C / Hgb A1C (Glycohemoglobin) (83036-A1C) TLB-PTT (85730-PTTL) TLB-PT (Protime)  (85610-PTP) TLB-Udip w/ Micro (81001-URINE) T-Vitamin D (25-Hydroxy) (29562-13086)  Problem # 3:  DIABETES MELLITUS, TYPE II (ICD-250.00) Assessment: Unchanged  The following medications were removed from the medication list:    Glucophage Xr 500 Mg Xr24h-tab (Metformin hcl) .Marland Kitchen... 2 once daily  Orders: Venipuncture (57846) TLB-BMP (Basic Metabolic Panel-BMET) (80048-METABOL) TLB-CBC Platelet - w/Differential (85025-CBCD) TLB-Hepatic/Liver Function Pnl (80076-HEPATIC) TLB-TSH (Thyroid Stimulating Hormone) (84443-TSH) TLB-A1C / Hgb A1C (Glycohemoglobin) (83036-A1C) TLB-PTT (85730-PTTL) TLB-PT (Protime) (85610-PTP) TLB-Udip w/ Micro (81001-URINE) T-Vitamin D (25-Hydroxy) (96295-28413) Ophthalmology Referral (Ophthalmology)  Labs Reviewed: Creat: 0.6 (06/08/2008)    Reviewed HgBA1c results: 6.9 (11/04/2006)  Problem # 4:  FEVER, RECURRENT (ICD-087.9) Assessment: Unchanged  Problem # 5:  TOBACCO USER (ICD-305.1) Assessment: Unchanged  Encouraged smoking cessation and discussed different methods for smoking cessation.   Orders: Tobacco use cessation intermediate 3-10 minutes (24401)  Problem # 6:  COPD (ICD-496) Assessment: Unchanged  Orders: Tobacco use cessation intermediate 3-10 minutes (02725)  Pulmonary Functions Reviewed: O2 sat: 92 (10/21/2009)     Vaccines Reviewed: Flu Vax: Fluvax Non-MCR (06/08/2009)  Complete Medication List: 1)  Ambien 10 Mg Tabs (Zolpidem tartrate) .Marland Kitchen.. 1 at bedtime as needed 2)  Lorazepam 2 Mg Tabs (Lorazepam) .... Three times a day 3)  Detrol La 4 Mg Cp24 (Tolterodine tartrate) .... At bedtime 4)  Fosamax 70 Mg Tabs (Alendronate sodium) .Marland KitchenMarland KitchenMarland Kitchen 1 weekly 5)  Kadian 60 Mg Xr24h-cap (Morphine sulfate) .... Two times a day 6)  Oxycodone-acetaminophen 10-325 Mg Tabs (Oxycodone-acetaminophen) .... Q6hr as needed 7)  Calcitriol 0.5 Mcg Caps (Calcitriol) .Marland Kitchen.. 1 bid 8)  Vitamin D 1000 Unit Tabs (Cholecalciferol) .... Once daily 9)  Freestyle  Lite Test Strp (Glucose blood) .... Qd 10)  Freestyle Unistick Ii Lancets Misc (Lancets) .... Qd  Patient Instructions: 1)  Please schedule a follow-up appointment in 2 weeks. 2)  Avoid foods high in acid (tomatoes, citrus juices, spicy foods). Avoid eating within two hours of lying down or before exercising. Do not over eat; try smaller more frequent meals. Elevate head of bed twelve inches when sleeping. 3)  Tobacco is very bad for your health and your loved ones! You Should stop smoking!. 4)  Stop Smoking Tips: Choose a Quit date. Cut down before the Quit date. decide what you will do as a substitute when you feel the urge to smoke(gum,toothpick,exercise). 5)  Check your  blood sugars regularly. If your readings are usually above 200  or below 70 you should contact our office. 6)  It is important that your Diabetic A1c level is checked every 3 months. 7)  See your eye doctor yearly to check for diabetic eye damage. 8)  Check your feet each night for sore areas, calluses or signs of infection. 9)  Check your Blood Pressure regularly. If it is above 130/80: you should make an appointment.

## 2010-11-02 NOTE — Progress Notes (Signed)
Summary: Critical LAB  Phone Note From Other Clinic   Summary of Call: SEE REPORT IN EMR, Critical LAB, Urine glucose > 1000. Initial call taken by: Lamar Sprinkles, CMA,  June 21, 2009 4:22 PM  Follow-up for Phone Call        Pls inform pt   -Low carb diet -Have husband pick up a glucometer and check cbg q am -RTC 2 wks w a record -No UTI - no need to take Cipro Follow-up by: Tresa Garter MD,  June 21, 2009 5:14 PM  Additional Follow-up for Phone Call Additional follow up Details #1::        Called pt she wanted me to inform caretaker (bonnie) because husband was not home. will leave glucose monitor up front for pick-up Additional Follow-up by: Orlan Leavens,  June 22, 2009 9:13 AM    New/Updated Medications: FREESTYLE LITE TEST  STRP (GLUCOSE BLOOD) qd FREESTYLE UNISTICK II LANCETS  MISC (LANCETS) qd

## 2010-11-02 NOTE — Progress Notes (Signed)
Summary: Chantix  Phone Note Call from Patient   Caller: [name] [phone] Summary of Call: [name] Nurse called:Pt wants to try to quit smoking and would like to try chantix.  Initial call taken by: Lamar Sprinkles,  January 26, 2009 3:16 PM  Follow-up for Phone Call        OK Follow-up by: Tresa Garter MD,  January 26, 2009 5:10 PM  Additional Follow-up for Phone Call Additional follow up Details #1::        lf mess for [facility] nurse Additional Follow-up by: Lamar Sprinkles,  January 27, 2009 10:57 AM      Prescriptions: CHANTIX 1 MG TABS (VARENICLINE TARTRATE) 1 tab by mouth 2 times daily  #60 x 3   Entered by:   Lamar Sprinkles   Authorized by:   Tresa Garter MD   Signed by:   Lamar Sprinkles on 01/27/2009   Method used:   Electronically to        [facility] [address] [phone]* (retail)       [address]       [place], [place]  [address]       Ph: [phone] or [phone]       Fax: [phone]   RxID:   [id number] CHANTIX STARTING MONTH PAK 0.5 MG X 11 & 1 MG X 42  MISC (VARENICLINE TARTRATE) 0.5mg  by mouth once daily for 3 days, then twice daily for 4 days and then 1mg  by mouth 2 times daily  #1 pack x 0   Entered by:   Lamar Sprinkles   Authorized by:   Tresa Garter MD   Signed by:   Lamar Sprinkles on 01/27/2009   Method used:   Electronically to        [facility] [address] [phone]* (retail)       [address]       [place], [place]  [address]       Ph: [phone] or [phone]       Fax: [phone]   RxID:   [id number]

## 2010-11-02 NOTE — Progress Notes (Signed)
  Phone Note Refill Request   Refills Requested: Medication #1:  LORAZEPAM 2 MG  TABS three times a day Initial call taken by: Lamar Sprinkles,  December 20, 2008 12:09 PM  Follow-up for Phone Call        OK 6 mo Follow-up by: Tresa Garter MD,  December 20, 2008 1:36 PM      Prescriptions: LORAZEPAM 2 MG  TABS (LORAZEPAM) three times a day  #90 x 5   Entered by:   Lamar Sprinkles   Authorized by:   Tresa Garter MD   Signed by:   Lamar Sprinkles on 12/20/2008   Method used:   Telephoned to ...       [facility] [address] [phone]* (retail)       [address]       [place], [place]  [address]       Ph: [phone] or [phone]       Fax: [phone]   RxID:   [id number]

## 2010-11-02 NOTE — Progress Notes (Signed)
Summary: Weight loss/accidents  Phone Note Call from Patient Call back at Home Phone [phone]   Summary of Call: Patient is a diabetic and is taking Metformin. Patient called today c/o weight loss and frequent BM accidents. Any recommendations or should the patient come in? Please advise. Initial call taken by: Lucious Groves,  October 19, 2009 9:49 AM  Follow-up for Phone Call        Try Fortamet (metformine XR) 1 a day - may be better. If not - will have to use smthg else Follow-up by: Tresa Garter MD,  October 19, 2009 1:48 PM  Additional Follow-up for Phone Call Additional follow up Details #1::        sent prescription. Patient notified. Additional Follow-up by: Lucious Groves,  October 19, 2009 2:11 PM    New/Updated Medications: FORTAMET 1000 MG XR24H-TAB (METFORMIN HCL) 1 by mouth qd Prescriptions: FORTAMET 1000 MG XR24H-TAB (METFORMIN HCL) 1 by mouth qd  #30 x 12   Entered and Authorized by:   Tresa Garter MD   Signed by:   Lucious Groves on 10/19/2009   Method used:   Electronically to        [facility] [address] [phone]* (retail)       [address]       [place], [place]  [address]       Ph: [phone] or [phone]       Fax: [phone]   RxID:   [id number]

## 2010-11-02 NOTE — Progress Notes (Signed)
Summary: Metformin refill  Phone Note From Pharmacy   Caller: [facility] Summary of Call: Patient is requesting refill of metformin 500mg  1 two times a day. Ok to fill? EMR has XR form. Initial call taken by: Lamar Sprinkles, CMA,  January 03, 2010 12:14 PM  Follow-up for Phone Call        Whatever she is taking now is OK. Follow-up by: Tresa Garter MD,  January 03, 2010 1:19 PM  Additional Follow-up for Phone Call Additional follow up Details #1::        left mess to call office back.....................Marland KitchenLamar Sprinkles, CMA  January 03, 2010 2:40 PM     Additional Follow-up for Phone Call Additional follow up Details #2::    Patient confirmed that she takes the Metformin ER. Follow-up by: Lucious Groves,  January 04, 2010 9:19 AM  Prescriptions: GLUCOPHAGE XR 500 MG XR24H-TAB (METFORMIN HCL) 2 daily  #60 x 6   Entered by:   Lucious Groves   Authorized by:   Tresa Garter MD   Signed by:   Lucious Groves on 01/04/2010   Method used:   Faxed to ...       [facility] (mail-order)       [address], [address] [place]  [address]       Ph: [phone]       Fax: [phone]   RxID:   [id number]

## 2010-11-02 NOTE — Miscellaneous (Signed)
Summary: [facility]  [facility]   Imported By: Lester Turner 01/20/2009 11:47:04  _____________________________________________________________________  External Attachment:    Type:   Image     Comment:   External Document

## 2010-11-02 NOTE — Miscellaneous (Signed)
Summary: long term svc/[facility]  long term svc/[facility]   Imported By: Lester Sheridan 05/19/2008 11:57:44  _____________________________________________________________________  External Attachment:    Type:   Image     Comment:   External Document

## 2010-11-02 NOTE — Progress Notes (Signed)
Summary: Jury Duty  Phone Note Call from Patient Call back at [facility] Phone [phone]   Summary of Call: Pt says she is not able to go to jury duty and would like a note from Dr Posey Rea.  Initial call taken by: Lamar Sprinkles, CMA,  October 24, 2009 10:16 AM  Follow-up for Phone Call        I need a letter w/date and juror's number Follow-up by: Tresa Garter MD,  October 24, 2009 12:05 PM  Additional Follow-up for Phone Call Additional follow up Details #1::        Pt informed, she will drop off jury letter................Marland KitchenLamar Sprinkles, CMA  October 24, 2009 2:05 PM   Pt dropped off letter, it is in Dr's red folder...................Marland KitchenLamar Sprinkles, CMA  October 25, 2009 9:45 AM  Letter completed and mailed to [employer]. 1.26.2011  10:41 AM Additional Follow-up by: Daphane Shepherd,  October 26, 2009 10:41 AM

## 2010-11-02 NOTE — Progress Notes (Signed)
Summary: HERNIA?  Phone Note Call from Patient Call back at [facility] Phone [phone]   Summary of Call: Pt c/o abd pain, says that it has been on and off for weeks. She says that last night the pain became worse than usual. NO n/v or fever. She thinks that it is a hernia and says that she feels a bulge that is sensitive to touch. Adivsed pt that I would ask Dr Posey Rea for his suggestions. Pt has apt with sat clinic.  Initial call taken by: Lamar Sprinkles,  January 16, 2008 3:16 PM  Follow-up for Phone Call        Keep OV on Sat. Stay on clear liquids for 1 d. To ER if the pain is bad. Hold Fosamax. Follow-up by: Tresa Garter MD,  January 16, 2008 5:58 PM

## 2010-11-02 NOTE — Progress Notes (Signed)
Summary: APT  Phone Note Call from Patient   Caller: Vernell Barrier [facility] [phone] Summary of Call: Nurse from [place] called: Pt has elevated temp, bilat wheezing in lungs and rash on neck. Nurse is req pt to be seen in office. Ok to wk in tomorrow with you? Call nurse or pt with apt Initial call taken by: Lamar Sprinkles,  January 12, 2009 1:32 PM  Follow-up for Phone Call        OK w/in w/me or Dr L. Follow-up by: Tresa Garter MD,  January 12, 2009 2:44 PM  Additional Follow-up for Phone Call Additional follow up Details #1::        left message with kerri/nurse to call back and sched appt for tomorrow. Additional Follow-up by: Verdell Face,  January 12, 2009 2:58 PM    Additional Follow-up for Phone Call Additional follow up Details #2::    appt 4/15 at 10:30am w/Dr Plotnikov Follow-up by: Verdell Face,  January 12, 2009 3:04 PM

## 2010-11-02 NOTE — Miscellaneous (Signed)
Summary: med s to verify/[facility]  med s to verify/[facility]   Imported By: Lester Calera 07/08/2008 11:25:50  _____________________________________________________________________  External Attachment:    Type:   Image     Comment:   External Document

## 2010-11-02 NOTE — Progress Notes (Signed)
Summary: glucophage  Phone Note Refill Request Message from:  Fax from Pharmacy on July 21, 2009 1:48 PM  Refills Requested: Medication #1:  GLUCOPHAGE 500 MG TABS 1 by mouth bid. #90 [facility] [phone]   Method Requested: Fax to Mail Away Pharmacy Initial call taken by: Orlan Leavens,  July 21, 2009 1:51 PM    Prescriptions: GLUCOPHAGE 500 MG TABS (METFORMIN HCL) 1 by mouth bid  #180 x 3   Entered by:   Orlan Leavens   Authorized by:   Tresa Garter MD   Signed by:   Orlan Leavens on 07/21/2009   Method used:   Faxed to ...       [facility]* (mail-order)       [address], [address] [place]  [address]       Ph: [phone]       Fax: [phone]   RxID:   [id number]

## 2010-11-02 NOTE — Miscellaneous (Signed)
[facility]   Imported By: Esmeralda Links D'jimraou 03/29/2008 10:36:29  _____________________________________________________________________  External Attachment:    Type:   Image     Comment:   External Document

## 2010-11-02 NOTE — Assessment & Plan Note (Signed)
Summary: 2 mos f/u #/cd   Vital Signs:  Patient profile:   60 year old female Weight:      115 pounds Temp:     98.6 degrees F oral Pulse rate:   94 / minute BP sitting:   116 / 66  (left arm)  Vitals Entered By: Tora Perches (November 14, 2009 2:51 PM) CC: f/u Is Patient Diabetic? Yes   Primary Care Provider:  Tresa Garter MD  CC:  f/u.  History of Present Illness: The patient presents for a follow up of hypertension, diabetes, wt loss, anxiety    Preventive Screening-Counseling & Management  Alcohol-Tobacco     Smoking Status: current  Current Medications (verified): 1)  Ambien 10 Mg  Tabs (Zolpidem Tartrate) .Marland Kitchen.. 1 At Bedtime As Needed 2)  Lorazepam 2 Mg  Tabs (Lorazepam) .... Three Times A Day 3)  Detrol La 4 Mg  Cp24 (Tolterodine Tartrate) .... At Bedtime 4)  Fosamax 70 Mg  Tabs (Alendronate Sodium) .Marland Kitchen.. 1 Weekly 5)  Kadian 60 Mg Xr24h-Cap (Morphine Sulfate) .... Two Times A Day 6)  Oxycodone-Acetaminophen 10-325 Mg Tabs (Oxycodone-Acetaminophen) .... Q6hr As Needed 7)  Calcitriol 0.5 Mcg Caps (Calcitriol) .Marland Kitchen.. 1 Bid 8)  Vitamin D 1000 Unit Tabs (Cholecalciferol) .... Once Daily 9)  Freestyle Lite Test  Strp (Glucose Blood) .... Qd 10)  Freestyle Unistick Ii Lancets  Misc (Lancets) .... Qd 11)  Glucophage Xr 500 Mg Xr24h-Tab (Metformin Hcl) .... 2 Daily 12)  Fortamet 1000 Mg Xr24h-Tab (Metformin Hcl) .Marland Kitchen.. 1 By Mouth Qd  Allergies: 1)  ! Fosamax (Alendronate Sodium) 2)  ! Prozac (Fluoxetine Hcl)  Past History:  Social History: Last updated: 06/08/2008 Occupation:disabled Married Current Smoker Alcohol use-no  Past Medical History: Depression GERD Seizure disorder Gait disorder H/o anoxic brain injury Chronic neck and shoulder pain COPD Low back pain Osteoarthritis Diabetes mellitus, type II 2010 Anxiety  Review of Systems       The patient complains of weight loss.  The patient denies fever, chest pain, dyspnea on exertion, and  abdominal pain.         glu 120-130  Physical Exam  General:  no acute distress; alert,appropriate and cooperative throughout examination Mouth:  Oral mucosa and oropharynx without lesions or exudates.  Teeth in good repair. Neck:  supple, full ROM, no masses, no thyromegaly, no JVD, normal carotid upstroke, and no carotid bruits.   Lungs:  normal respiratory effort, no intercostal retractions, no accessory muscle use, normal breath sounds, no dullness, no fremitus, no crackles, and no wheezes.   Heart:  normal rate, regular rhythm, no murmur, no gallop, no rub, and no JVD.   Abdomen:  soft, non-tender, no distention, no masses, no guarding, no rigidity, no rebound tenderness, no hepatomegaly, no splenomegaly, abdominal scar(s), and bowel sounds hyperactive.   Msk:  walks w/a walker weak LE Extremities:  No edema B Neurologic:  alert & oriented X3, abnormal gait, and ataxic gait.   Skin:  WNL  Psych:  Oriented X3, good eye contact, and not depressed appearing.     Impression & Recommendations:  Problem # 1:  DIABETES MELLITUS, TYPE II (ICD-250.00) Assessment Improved  Her updated medication list for this problem includes:    Glucophage Xr 500 Mg Xr24h-tab (Metformin hcl) .Marland Kitchen... 2 daily    Fortamet 1000 Mg Xr24h-tab (Metformin hcl) .Marland Kitchen... 1 by mouth qd Ophth cons is pending   Problem # 2:  WEIGHT LOSS (ICD-783.21) Assessment: Deteriorated ?? due to metforming Will watch -  RTC 2 months   Problem # 3:  LOW BACK PAIN (ICD-724.2)  Her updated medication list for this problem includes:    Kadian 60 Mg Xr24h-cap (Morphine sulfate) .Marland Kitchen..Marland Kitchen Two times a day    Oxycodone-acetaminophen 10-325 Mg Tabs (Oxycodone-acetaminophen) ..... Q6hr as needed  Problem # 4:  PAIN, CHRONIC NEC (ICD-338.29) Assessment: Unchanged On prescription therapy   Problem # 5:  DEPRESSION (ICD-311) Assessment: Improved  Her updated medication list for this problem includes:    Lorazepam 2 Mg Tabs (Lorazepam)  .Marland Kitchen... Three times a day  Complete Medication List: 1)  Ambien 10 Mg Tabs (Zolpidem tartrate) .Marland Kitchen.. 1 at bedtime as needed 2)  Lorazepam 2 Mg Tabs (Lorazepam) .... Three times a day 3)  Detrol La 4 Mg Cp24 (Tolterodine tartrate) .... At bedtime 4)  Fosamax 70 Mg Tabs (Alendronate sodium) .Marland KitchenMarland KitchenMarland Kitchen 1 weekly 5)  Kadian 60 Mg Xr24h-cap (Morphine sulfate) .... Two times a day 6)  Oxycodone-acetaminophen 10-325 Mg Tabs (Oxycodone-acetaminophen) .... Q6hr as needed 7)  Calcitriol 0.5 Mcg Caps (Calcitriol) .Marland Kitchen.. 1 bid 8)  Vitamin D 1000 Unit Tabs (Cholecalciferol) .... Once daily 9)  Freestyle Lite Test Strp (Glucose blood) .... Qd 10)  Freestyle Unistick Ii Lancets Misc (Lancets) .... Qd 11)  Glucophage Xr 500 Mg Xr24h-tab (Metformin hcl) .... 2 daily 12)  Fortamet 1000 Mg Xr24h-tab (Metformin hcl) .Marland Kitchen.. 1 by mouth qd  Patient Instructions: 1)  Please schedule a follow-up appointment in 2 months. 2)  BMP prior to visit, ICD-9: 3)  Hepatic Panel prior to visit, ICD-9: 4)  HbgA1C prior to visit, ICD-9: 5)  TSH prior to visit, ICD-9:250.00 6)  Vit B12 782.0 7)  Try to eat more raw plant food, fresh and dry fruit, raw almonds, leafy vegetables, whole foods and less red meat, less animal fat. Poultry and fish is better for you than pork and beef. Avoid processed foods (canned soups, hot dogs, sausage, bacon , frozen dinners). Avoid corn syrup, high fructose syrup or aspartam and Splenda  containing drinks. Honey, Agave, raw cane sugar and Stevia are better sweeteners. Make your own  dressing with olive oil, wine vinegar, lemon juce, garlic etc. for your salads. Prescriptions: CALCITRIOL 0.5 MCG CAPS (CALCITRIOL) 1 bid  #60 x 12   Entered and Authorized by:   Tresa Garter MD   Signed by:   Tresa Garter MD on 11/14/2009   Method used:   Print then Give to Patient   RxID:   [id number] DETROL LA 4 MG  CP24 (TOLTERODINE TARTRATE) at bedtime  #30 x 12   Entered and Authorized by:   Tresa Garter MD   Signed by:   Tresa Garter MD on 11/14/2009   Method used:   Print then Give to Patient   RxID:   [id number] LORAZEPAM 2 MG  TABS (LORAZEPAM) three times a day  #90 x 6   Entered and Authorized by:   Tresa Garter MD   Signed by:   Tresa Garter MD on 11/14/2009   Method used:   Print then Give to Patient   RxID:   [id number]    ft

## 2010-11-02 NOTE — Miscellaneous (Signed)
Summary: update meds  Clinical Lists Changes  Medications: Changed medication from CALCITRIOL 0.5 MCG CAPS (CALCITRIOL) 1 by mouth qd to CALCITRIOL 0.5 MCG CAPS (CALCITRIOL) 1 bid

## 2010-11-02 NOTE — Miscellaneous (Signed)
Summary: Referral form for PT/[facility]  Referral form for PT/ [facility]   Imported By: Lester [place] 03/29/2009 08:04:17  _____________________________________________________________________  External Attachment:    Type:   Image     Comment:   External Document

## 2010-11-02 NOTE — Miscellaneous (Signed)
Summary: [place] discrepancies w meds/[facility]  [place] w meds/[facility]   Imported By: Lester [place] 06/24/2008 09:49:48  _____________________________________________________________________  External Attachment:    Type:   Image     Comment:   External Document

## 2010-11-02 NOTE — Medication Information (Signed)
Summary: [facility] Testing Supp[facility]  [facility]   Imported By: Sherian Rein 07/21/2009 09:10:20  _____________________________________________________________________  External Attachment:    Type:   Image     Comment:   External Document

## 2010-11-02 NOTE — Miscellaneous (Signed)
Summary: lorazepam  Clinical Lists Changes  Medications: Rx of LORAZEPAM 2 MG  TABS (LORAZEPAM) three times a day;  #90 x 3;  Signed;  Entered by: Tora Perches;  Authorized by: Tresa Garter MD;  Method used: [facility]oned to [facility] [address] [phone]*, [address] [place], [place]  [address], Ph: [phone] or [phone], Fax: [phone]    Prescriptions: LORAZEPAM 2 MG  TABS (LORAZEPAM) three times a day  #90 x 3   Entered by:   Tora Perches   Authorized by:   Tresa Garter MD   Signed by:   Tora Perches on 12/04/2007   Method used:   Telephoned to ...       [facility] [id number]       [address]       [place], [place]  [address]       Ph: [phone] or [phone]       Fax: [phone]   RxID:   [id number]

## 2010-11-02 NOTE — Letter (Signed)
Summary: Results Follow-up Letter  [facility]  [address] [place], [place] [address]   Phone: [phone]  Fax: [phone]    10/24/2009  [address] [place], [place]  [address]  Dear Ms. Kramar,   The following are the results of your recent test(s):  Test     Result     Blood sugar     202 Kidney/liver   normal CBC       normal Vitamin D     slightly low A1C       7.3, good blood sugar control Thyroid     normal Urine       trace of infection   _________________________________________________________  Please call for an appointment in 2-3 weeks _________________________________________________________ _________________________________________________________ _________________________________________________________  Sincerely,  Sanda Linger MD [facility]

## 2010-11-02 NOTE — Miscellaneous (Signed)
Summary: BONE DENSITY  Clinical Lists Changes  Orders: Added new Test order of T-Bone Densitometry (77080) - Signed Added new Test order of T-Lumbar Vertebral Assessment (77082) - Signed 

## 2010-11-02 NOTE — Progress Notes (Signed)
  Phone Note Call from Patient   Summary of Call: Pt states Fortamet requires PA. Would you like to percieve? Initial call taken by: Josph Macho CMA,  October 20, 2009 10:51 AM  Follow-up for Phone Call        OK - or she can have a generic Metformin XR if avail Follow-up by: Tresa Garter MD,  October 20, 2009 12:51 PM  Additional Follow-up for Phone Call Additional follow up Details #1::        Informed pt Additional Follow-up by: Josph Macho CMA,  October 20, 2009 4:25 PM    New/Updated Medications: GLUCOPHAGE XR 500 MG XR24H-TAB (METFORMIN HCL) 2 once daily Prescriptions: GLUCOPHAGE XR 500 MG XR24H-TAB (METFORMIN HCL) 2 once daily  #60 x 6   Entered by:   Josph Macho CMA   Authorized by:   Tresa Garter MD   Signed by:   Josph Macho CMA on 10/20/2009   Method used:   [facility]ronically to        [facility] [address] [phone]* (retail)       [address]       [place], [place]  [address]       Ph: [phone] or [phone]       Fax: [phone]   RxID:   [id number]

## 2010-11-02 NOTE — Assessment & Plan Note (Signed)
Summary: FU [name]   Vital Signs:  Patient Profile:   60 Years Old Female Weight:      138 pounds Temp:     98.4 degrees F oral Pulse rate:   96 / minute BP sitting:   134 / 80  (left arm)  Vitals Entered By: Tora Perches (June 08, 2008 1:54 PM)                 Chief Complaint:  Multiple medical problems or concerns.  History of Present Illness: The patient presents for a follow up of a boil in R groin that popped last wk     Current Allergies (reviewed today): ! PREVACID (LANSOPRAZOLE) ! FOSAMAX (ALENDRONATE SODIUM) ! PROZAC (FLUOXETINE HCL)  Past Medical History:    Reviewed history from 02/26/2007 and no changes required:       Depression       GERD       Seizure disorder   Family History:    Family History Hypertension  Social History:    Occupation:disabled    Married    Current Smoker    Alcohol use-no   Risk Factors:  Tobacco use:  current Alcohol use:  no   Review of Systems  The patient denies fever, vision loss, chest pain, and abdominal pain.     Physical Exam  General:     Well-developed,well-nourished,in no acute distress; alert,appropriate and cooperative throughout examination Mouth:     Oral mucosa and oropharynx without lesions or exudates.  Teeth in good repair. Neck:     No deformities, masses, or tenderness noted. Lungs:     Normal respiratory effort, chest expands symmetrically. Lungs are clear to auscultation, no crackles or wheezes. Heart:     Normal rate and regular rhythm. S1 and S2 normal without gallop, murmur, click, rub or other extra sounds. Abdomen:     Bowel sounds positive,abdomen soft and non-tender without masses, organomegaly or hernias noted. Msk:     Walker Neurologic:     Ataxia Skin:     resolved boil LR groin Psych:     Oriented X3.      Impression & Recommendations:  Problem # 1:  FURUNCLE (ICD-680.9) R groin Assessment: Improved Get labs Orders: TLB-Udip w/ Micro  (81001-URINE) TLB-CBC Platelet - w/Differential (85025-CBCD) TLB-BMP (Basic Metabolic Panel-BMET) (80048-METABOL) TLB-Udip ONLY (81003-UDIP)   Problem # 2:  LEG EDEMA, CHRONIC (ICD-782.3) Assessment: Improved  Problem # 3:  SHOULDER PAIN, LEFT (ICD-719.41) Assessment: Unchanged  Her updated medication list for this problem includes:    Kadian 80 Mg Xr24h-cap (Morphine sulfate) .Marland Kitchen..Marland Kitchen Two times a day    Oxycodone-acetaminophen 10-325 Mg Tabs (Oxycodone-acetaminophen) ..... Q6hr as needed   Problem # 4:  SYMPTOM, ABNORMALITY, GAIT (ICD-781.2) Assessment: Unchanged  Orders: T-Vitamin D (25-Hydroxy) (44[address]-27253)   Problem # 5:  GERD (ICD-530.81) Assessment: Unchanged  The following medications were removed from the medication list:    Prevacid 30 Mg Cpdr (Lansoprazole) ..... Once daily   Complete Medication List: 1)  Ambien 10 Mg Tabs (Zolpidem tartrate) .Marland Kitchen.. 1 at bedtime as needed 2)  Lorazepam 2 Mg Tabs (Lorazepam) .... Three times a day 3)  Detrol La 4 Mg Cp24 (Tolterodine tartrate) .... At bedtime 4)  Senokot 8.6 Mg Tabs (Sennosides) .... Once daily  as needed 5)  Fosamax 70 Mg Tabs (Alendronate sodium) .Marland KitchenMarland KitchenMarland Kitchen 1 weekly 6)  Klor-con M20 20 Meq Cr-tabs (Potassium chloride crys cr) .... Once daily 7)  Kadian 80 Mg Xr24h-cap (Morphine sulfate) .Marland KitchenMarland KitchenMarland Kitchen  Two times a day 8)  Oxycodone-acetaminophen 10-325 Mg Tabs (Oxycodone-acetaminophen) .... Q6hr as needed 9)  Calcitriol 0.5 Mcg Caps (Calcitriol) .Marland Kitchen.. 1 by mouth qd  Other Orders: TLB-TSH (Thyroid Stimulating Hormone) (01027-OZD)   Patient Instructions: 1)  Please schedule a follow-up appointment in 4 months.   Prescriptions: CALCITRIOL 0.5 MCG CAPS (CALCITRIOL) 1 by mouth qd  #30 x 12   Entered and Authorized by:   Tresa Garter MD   Signed by:   Tresa Garter MD on 06/11/2008   Method used:   [facility]ically to        [facility] [address] [phone]* (retail)       [address]       [place], [place]   [address]       Ph: [phone] or [phone]       Fax: [phone]   RxID:   [id number]  ]

## 2010-11-02 NOTE — Progress Notes (Signed)
Summary: metformin  ---- Converted from flag ---- ---- 06/24/2009 3:34 PM, Josph Macho CMA wrote:   ---- 06/24/2009 12:55 PM, Georgina Quint Plotnikov MD wrote: Call in Metformin 500 mg 1 by mouth  once daily  #30 3 ref please  ---- 06/24/2009 11:29 AM, Josph Macho CMA wrote:   ---- 06/24/2009 11:21 AM, Ivar Bury wrote: DR Levy Sjogren BLOOD SUGAR IS RUNNING 161-096.  THIS AM IT WAS 297---THANK YOU FOR YOUR ADVICE---MR#  [id number] ------------------------------       New/Updated Medications: GLUCOPHAGE 500 MG TABS (METFORMIN HCL) once daily Prescriptions: GLUCOPHAGE 500 MG TABS (METFORMIN HCL) once daily  #30 x 3   Entered by:   Tora Perches   Authorized by:   Tresa Garter MD   Signed by:   Tora Perches on 06/24/2009   Method used:   [facility]        [facility] [address] [phone][facility] (retail)       [address]       [place], [place]  [address]       Ph: [phone] or [phone]       Fax: [phone]   RxID:   [id number]   Appended Document: metformin called pt---spoke with care giver Kendal Hymen---  giver is aware/vg

## 2010-11-02 NOTE — Letter (Signed)
Summary: [facility]   Imported By: Lester Timblin 11/19/2009 09:31:44  _____________________________________________________________________  External Attachment:    Type:   Image     Comment:   External Document

## 2010-11-02 NOTE — Assessment & Plan Note (Signed)
Summary: FLU SHOT/AVP/NML   Nurse Visit       Influenza Vaccine    Vaccine Type: Fluvax Non-MCR    Site: left deltoid    Mfr: Sanofi Pasteur    Dose: 0.5 ml    Route: IM    Given by: Tora Perches    Exp. Date: 03/30/2008    Lot #: [id number]    VIS given: 04/24/07 version given September 19, 2007.  Flu Vaccine Consent Questions    Do you have a history of severe allergic reactions to this vaccine? no    Any prior history of allergic reactions to egg and/or gelatin? no    Do you have a sensitivity to the preservative Thimersol? no    Do you have a past history of Guillan-Barre Syndrome? no    Do you currently have an acute febrile illness? no    Have you ever had a severe reaction to latex? no    Vaccine information given and explained to patient? yes    Are you currently pregnant? no   Orders Added: 1)  Influenza Vaccine NON MCR [00028]    ]

## 2010-11-02 NOTE — Assessment & Plan Note (Signed)
Summary: 2 MO ROV /NWS $50   Vital Signs:  Patient profile:   60 year old female Weight:      131 pounds Temp:     100.2 degrees F oral Pulse rate:   95 / minute BP sitting:   126 / 78  (left arm)  Vitals Entered By: Tora Perches (June 21, 2009 2:43 PM) CC: f/u Is Patient Diabetic? No   CC:  f/u.  History of Present Illness: The patient presents for a follow up of chronic pain, urinary urgency. hyperlipidemia   Allergies: 1)  ! Prevacid (Lansoprazole) 2)  ! Fosamax (Alendronate Sodium) 3)  ! Prozac (Fluoxetine Hcl)  Past History:  Past Medical History: Last updated: 02/25/2009 Depression GERD Seizure disorder Gait disorder H/o anoxic brain injury Chronic neck and shoulder pain COPD Low back pain Osteoarthritis  Social History: Last updated: 06/08/2008 Occupation:disabled Married Current Smoker Alcohol use-no  Review of Systems       The patient complains of abdominal pain.  The patient denies fever.    Physical Exam  General:  no acute distress; alert,appropriate and cooperative throughout examination Mouth:  Oral mucosa and oropharynx without lesions or exudates.  Teeth in good repair. Neck:  WNL Heart:  Normal rate and regular rhythm. S1 and S2 normal without gallop, murmur, click, rub or other extra sounds. Abdomen:  Bowel sounds positive,abdomen soft and non-tender without masses, organomegaly or hernias noted. Msk:  Using a walker, small ataxic steps Neurologic:  Ataxia Skin:  Clear Psych:  Oriented X3.  normally interactive and good eye contact.     Impression & Recommendations:  Problem # 1:  LOW BACK PAIN (ICD-724.2) Assessment Comment Only  Her updated medication list for this problem includes:    Kadian 80 Mg Xr24h-cap (Morphine sulfate) .Marland Kitchen..Marland Kitchen Two times a day    Oxycodone-acetaminophen 10-325 Mg Tabs (Oxycodone-acetaminophen) ..... Q6hr as needed  Problem # 2:  COPD (ICD-496) Assessment: Unchanged Stop smoking  Problem # 3:   UNSTEADY GAIT (ICD-781.2) Assessment: Improved  Problem # 4:  SEIZURE DISORDER (ICD-780.39) Assessment: Unchanged  Problem # 5:  DEPRESSION (ICD-311) Assessment: Unchanged  Her updated medication list for this problem includes:    Lorazepam 2 Mg Tabs (Lorazepam) .Marland Kitchen... Three times a day  Problem # 6:  FEVER, RECURRENT (ICD-087.9) ? etiol R/o UTI Orders: TLB-Udip ONLY (81003-UDIP)  Complete Medication List: 1)  Ambien 10 Mg Tabs (Zolpidem tartrate) .Marland Kitchen.. 1 at bedtime as needed 2)  Lorazepam 2 Mg Tabs (Lorazepam) .... Three times a day 3)  Detrol La 4 Mg Cp24 (Tolterodine tartrate) .... At bedtime 4)  Fosamax 70 Mg Tabs (Alendronate sodium) .Marland KitchenMarland KitchenMarland Kitchen 1 weekly 5)  Kadian 80 Mg Xr24h-cap (Morphine sulfate) .... Two times a day 6)  Oxycodone-acetaminophen 10-325 Mg Tabs (Oxycodone-acetaminophen) .... Q6hr as needed 7)  Calcitriol 0.5 Mcg Caps (Calcitriol) .Marland Kitchen.. 1 bid 8)  Vitamin D 1000 Unit Tabs (Cholecalciferol) .... Once daily 9)  Topicort 0.25 % Crea (Desoximetasone) .... Use two times a day - tid 10)  Chantix Starting Month Pak 0.5 Mg X 11 & 1 Mg X 42 Misc (Varenicline tartrate) .... 0.5mg  by mouth once daily for 3 days, then twice daily for 4 days and then 1mg  by mouth 2 times daily 11)  Cipro 250 Mg Tabs (Ciprofloxacin hcl) .Marland Kitchen.. 1 by mouth 2 times daily  Patient Instructions: 1)  Please schedule a follow-up appointment in 3 months. Prescriptions: CIPRO 250 MG TABS (CIPROFLOXACIN HCL) 1 by mouth 2 times daily  #10 x  0   Entered and Authorized by:   Tresa Garter MD   Signed by:   Tresa Garter MD on 06/21/2009   Method used:   Print then Give to Patient   RxID:   [id number] CALCITRIOL 0.5 MCG CAPS (CALCITRIOL) 1 bid  #180 x 3   Entered and Authorized by:   Tresa Garter MD   Signed by:   Tresa Garter MD on 06/21/2009   Method used:   [facility]        [facility] [address] [phone]* (retail)       [address]       [place], [place]   [address]       Ph: [phone] or [phone]       Fax: [phone]   RxID:   [id number] DETROL LA 4 MG  CP24 (TOLTERODINE TARTRATE) at bedtime  #90 x 3   Entered and Authorized by:   Tresa Garter MD   Signed by:   Tresa Garter MD on 06/21/2009   Method used:   [facility] to        [facility] [address] [phone]* (retail)       [address]       [place], [place]  [address]       Ph: [phone] or [phone]       Fax: [phone]   RxID:   [id number]

## 2010-11-02 NOTE — Miscellaneous (Signed)
[facility]: [facility]   Imported By: Esmeralda Links D'jimraou 01/15/2008 12:57:50  _____________________________________________________________________  External Attachment:    Type:   Image     Comment:   External Document

## 2010-11-02 NOTE — Progress Notes (Signed)
Summary: REFERRAL  Phone Note Call from Patient   Caller: [facility] Summary of Call: Physical therapist from [facility] called. They are reccommending continuing PT on an outpt basis at [facility]. If ok please put in referral Initial call taken by: Lamar Sprinkles,  Feb 02, 2009 10:56 AM  Follow-up for Phone Call        OK Follow-up by: Tresa Garter MD,  Feb 03, 2009 11:08 PM

## 2010-11-02 NOTE — Progress Notes (Signed)
Summary: Fortamet PA  Phone Note Call from Patient   Caller: [facility] [phone] Call For: ID:  [id number]   Case:  [id number] Summary of Call: PA request--Fortamet. **Note this med was removed from med list for causing melena. Should we initiate PA? Initial call taken by: Lucious Groves,  November 02, 2009 5:06 PM  Follow-up for Phone Call        Yes pls PA- she corrrected Follow-up by: Tresa Garter MD,  November 03, 2009 12:46 PM  Additional Follow-up for Phone Call Additional follow up Details #1::        They will fax forms. Additional Follow-up by: Lucious Groves,  November 03, 2009 3:11 PM    Additional Follow-up for Phone Call Additional follow up Details #2::    rec'd and awaiting MD signature. Lucious Groves  November 04, 2009 8:23 AM   form faxed.  Lucious Groves  November 04, 2009 10:15 AM   Additional Follow-up for Phone Call Additional follow up Details #3:: Details for Additional Follow-up Action Taken: approved until 11-04-2010. Additional Follow-up by: Lucious Groves,  November 08, 2009 11:27 AM  New/Updated Medications: FORTAMET 1000 MG XR24H-TAB (METFORMIN HCL) 1 by mouth qd

## 2010-11-02 NOTE — Medication Information (Signed)
Summary: [facility]  [facility]   Imported By: Sherian Rein 08/04/2009 13:46:17  _____________________________________________________________________  External Attachment:    Type:   Image     Comment:   External Document

## 2010-11-02 NOTE — Medication Information (Signed)
Summary: [facility]  [facility]   Imported By: Sherian Rein 07/18/2009 08:17:24  _____________________________________________________________________  External Attachment:    Type:   Image     Comment:   External Document

## 2010-11-02 NOTE — Progress Notes (Signed)
Summary: THERAPY  Phone Note From Other Clinic   Caller: [phone] [facility] [name] Summary of Call: [name] would like to extend services for physical therapy. They are requesting verbal ok Initial call taken by: Lamar Sprinkles,  January 07, 2009 3:48 PM  Follow-up for Phone Call        OK Follow-up by: Tresa Garter MD,  January 07, 2009 5:10 PM  Additional Follow-up for Phone Call Additional follow up Details #1::        lf vm w/therapist Additional Follow-up by: Lamar Sprinkles,  January 07, 2009 6:11 PM

## 2010-11-02 NOTE — Consult Note (Signed)
Summary: Imported By: Esmeralda Links D'jimraou 03/30/2008 10:07:30   Imported By: Esmeralda Links D'jimraou 03/30/2008 10:07:30  _____________________________________________________________________  External Attachment:    Type:   Image     Comment:   External Document

## 2010-11-02 NOTE — Progress Notes (Signed)
Summary: Glucophage ?  Phone Note From Pharmacy   Caller: [facility] Summary of Call: rec Rf request for Glucophage XR 500mg  1 by mouth two times a day. Our list says three times a day. Which is correct? Initial call taken by: Lanier Prude, [facility]),  September 20, 2010 8:18 AM  Follow-up for Phone Call        three times a day is correct Thank you!  Follow-up by: Tresa Garter MD,  September 21, 2010 1:03 PM    Prescriptions: GLUCOPHAGE XR 500 MG XR24H-TAB (METFORMIN HCL) 1 by mouth tid  #270 x 3   Entered by:   Lamar Sprinkles, CMA   Authorized by:   Tresa Garter MD   Signed by:   Lamar Sprinkles, CMA on 09/21/2010   Method used:   Electronically to        [facility]* (mail-order)       [address], [address] [place] [address], [place]  [address]       Ph: [phone]       Fax: [phone]   RxID:   [id number]

## 2010-11-02 NOTE — Assessment & Plan Note (Signed)
Summary: 2 MO ROV /NWS   Vital Signs:  Patient profile:   60 year old female Height:      65 inches Weight:      117.50 pounds BMI:     19.62 O2 Sat:      95 % on Room air Temp:     99.2 degrees F oral Pulse rate:   104 / minute Pulse rhythm:   regular BP sitting:   120 / 80  (left arm) Cuff size:   large  Vitals Entered By: Rock Nephew CMA (January 13, 2010 2:43 PM)  O2 Flow:  Room air CC: 4month follow up Is Patient Diabetic? Yes Did you bring your meter with you today? No   Primary Care Provider:  Tresa Garter MD  CC:  4month follow up.  History of Present Illness: The patient presents for a follow up of hypertension, diabetes, hyperlipidemia   Allergies (verified): 1)  ! Fosamax (Alendronate Sodium) 2)  ! Prozac (Fluoxetine Hcl)  Past History:  Past Medical History: Last updated: 11/14/2009 Depression GERD Seizure disorder Gait disorder H/o anoxic brain injury Chronic neck and shoulder pain COPD Low back pain Osteoarthritis Diabetes mellitus, type II 2010 Anxiety  Social History: Last updated: 06/08/2008 Occupation:disabled Married Current Smoker Alcohol use-no  Physical Exam  General:  no acute distress; alert,appropriate and cooperative throughout examination Nose:  External nasal examination shows no deformity or inflammation. Nasal mucosa are pink and moist without lesions or exudates. Mouth:  Oral mucosa and oropharynx without lesions or exudates.  Teeth in good repair. Lungs:  normal respiratory effort, no intercostal retractions, no accessory muscle use, normal breath sounds, no dullness, no fremitus, no crackles, and no wheezes.   Heart:  normal rate, regular rhythm, no murmur, no gallop, no rub, and no JVD.   Abdomen:  soft, non-tender, no distention, no masses, no guarding, no rigidity, no rebound tenderness, no hepatomegaly, no splenomegaly, abdominal scar(s), and bowel sounds hyperactive.   Msk:  walks w/a walker weak  LE Neurologic:  alert & oriented X3, abnormal gait, and ataxic gait.   Skin:  WNL    Impression & Recommendations:  Problem # 1:  DIABETES MELLITUS, TYPE II (ICD-250.00) Assessment Improved  The following medications were removed from the medication list:    Fortamet 1000 Mg Xr24h-tab (Metformin hcl) .Marland Kitchen... 1 by mouth qd Her updated medication list for this problem includes:    Glucophage Xr 500 Mg Xr24h-tab (Metformin hcl) .Marland Kitchen... 2 daily  Orders: TLB-A1C / Hgb A1C (Glycohemoglobin) (83036-A1C) TLB-BMP (Basic Metabolic Panel-BMET) (80048-METABOL) TLB-Hepatic/Liver Function Pnl (80076-HEPATIC) TLB-CBC Platelet - w/Differential (85025-CBCD)  Problem # 2:  LOW BACK PAIN (ICD-724.2) Assessment: Unchanged  Her updated medication list for this problem includes:    Kadian 60 Mg Xr24h-cap (Morphine sulfate) .Marland Kitchen..Marland Kitchen Two times a day    Oxycodone-acetaminophen 10-325 Mg Tabs (Oxycodone-acetaminophen) ..... Q6hr as needed  Problem # 3:  OSTEOARTHRITIS (ICD-715.90) Assessment: Unchanged  Her updated medication list for this problem includes:    Kadian 60 Mg Xr24h-cap (Morphine sulfate) .Marland Kitchen..Marland Kitchen Two times a day    Oxycodone-acetaminophen 10-325 Mg Tabs (Oxycodone-acetaminophen) ..... Q6hr as needed  Problem # 4:  UNSTEADY GAIT (ICD-781.2) Assessment: Unchanged  Problem # 5:  LEG EDEMA, CHRONIC (ICD-782.3) Assessment: Improved  On prescription therapy   Complete Medication List: 1)  Ambien 10 Mg Tabs (Zolpidem tartrate) .Marland Kitchen.. 1 at bedtime as needed 2)  Lorazepam 2 Mg Tabs (Lorazepam) .... Three times a day 3)  Detrol La 4 Mg Cp24 (  Tolterodine tartrate) .... At bedtime 4)  Fosamax 70 Mg Tabs (Alendronate sodium) .Marland KitchenMarland KitchenMarland Kitchen 1 weekly 5)  Kadian 60 Mg Xr24h-cap (Morphine sulfate) .... Two times a day 6)  Oxycodone-acetaminophen 10-325 Mg Tabs (Oxycodone-acetaminophen) .... Q6hr as needed 7)  Calcitriol 0.5 Mcg Caps (Calcitriol) .Marland Kitchen.. 1 bid 8)  Freestyle Lite Test Strp (Glucose blood) .... Qd 9)   Freestyle Unistick Ii Lancets Misc (Lancets) .... Qd 10)  Glucophage Xr 500 Mg Xr24h-tab (Metformin hcl) .... 2 daily 11)  Vitamin D (ergocalciferol) 50000 Unit Caps (Ergocalciferol) .Marland Kitchen.. 1 by mouth weekly  Patient Instructions: 1)  Please schedule a follow-up appointment in 3 months. 2)  BMP prior to visit, ICD-9: 3)  Lipid Panel prior to visit, ICD-9:272.20 4)  HbgA1C prior to visit, ICD-9: 5)  Vit D 268.9 25.00

## 2010-11-02 NOTE — Miscellaneous (Signed)
Summary: Immunization Entry   Immunization History:  Influenza Immunization History:    Influenza:  Fluvax Non-MCR (06/08/2009) vacc. given at [facility] [address]

## 2010-11-02 NOTE — Progress Notes (Signed)
Summary: ZOLPIDEM  Phone Note Refill Request Message from:  Fax from Pharmacy on October 04, 2008 2:05 PM  Refills Requested: Medication #1:  AMBIEN 10 MG  TABS 1 at bedtime as needed LAST OV 06/08/08. PLS ADVISE   Last Refilled: 09/04/2008 [phone]  Initial call taken by: Orlan Leavens,  October 04, 2008 2:06 PM  Follow-up for Phone Call        OK 6 ref Follow-up by: Tresa Garter MD,  October 05, 2008 9:06 AM  Additional Follow-up for Phone Call Additional follow up Details #1::        Called pharmacy spoke with Ebony Cargo ok x 6 Additional Follow-up by: Orlan Leavens,  October 06, 2008 10:43 AM      Prescriptions: AMBIEN 10 MG  TABS (ZOLPIDEM TARTRATE) 1 at bedtime as needed  #30 x 6   Entered by:   Orlan Leavens   Authorized by:   Tresa Garter MD   Signed by:   Orlan Leavens on 10/06/2008   Method used:   Telephoned to ...       [facility]  [address] [phone] (retail)       [address]       [place], [place]  [address]       Ph: [phone] or [phone]       Fax: [phone]   RxID:   [id number]

## 2010-11-09 ENCOUNTER — Encounter: Payer: Self-pay | Admitting: Internal Medicine

## 2010-11-16 ENCOUNTER — Telehealth: Payer: Self-pay | Admitting: Internal Medicine

## 2010-11-16 NOTE — Consult Note (Signed)
Summary: [facility]  [facility]   Imported By: Sherian Rein 11/08/2010 11:16:45  _____________________________________________________________________  External Attachment:    Type:   Image     Comment:   External Document

## 2010-11-22 NOTE — Consult Note (Signed)
Summary: [facility]  [facility]   Imported By: Sherian Rein 11/15/2010 08:32:37  _____________________________________________________________________  External Attachment:    Type:   Image     Comment:   External Document

## 2010-11-28 ENCOUNTER — Encounter: Payer: Self-pay | Admitting: Internal Medicine

## 2010-11-28 NOTE — Progress Notes (Signed)
Summary: Aware MD out of the office until next week  Phone Note Call from Patient   Summary of Call: Patient is requesting in home therapy to help w/strength with her legs and manuevering around her home (OT also?). SHe has had many problems getting "stuck" and calls EMS to help her move after she was too weak to move around.  Initial call taken by: Lamar Sprinkles, CMA,  November 16, 2010 11:28 AM  Follow-up for Phone Call        ok Follow-up by: Tresa Garter MD,  November 19, 2010 12:37 PM

## 2010-12-06 ENCOUNTER — Encounter: Payer: Self-pay | Admitting: Internal Medicine

## 2010-12-06 ENCOUNTER — Other Ambulatory Visit: Payer: Self-pay | Admitting: Otolaryngology

## 2010-12-06 DIAGNOSIS — Z5189 Encounter for other specified aftercare: Secondary | ICD-10-CM

## 2010-12-06 DIAGNOSIS — R269 Unspecified abnormalities of gait and mobility: Secondary | ICD-10-CM

## 2010-12-06 DIAGNOSIS — G40909 Epilepsy, unspecified, not intractable, without status epilepticus: Secondary | ICD-10-CM

## 2010-12-06 DIAGNOSIS — E119 Type 2 diabetes mellitus without complications: Secondary | ICD-10-CM

## 2010-12-07 ENCOUNTER — Encounter: Payer: Self-pay | Admitting: Internal Medicine

## 2010-12-12 NOTE — Consult Note (Signed)
Summary: [facility]  [facility]   Imported By: Sherian Rein 12/04/2010 13:39:56  _____________________________________________________________________  External Attachment:    Type:   Image     Comment:   External Document

## 2010-12-19 DIAGNOSIS — Z0279 Encounter for issue of other medical certificate: Secondary | ICD-10-CM

## 2010-12-19 NOTE — Miscellaneous (Signed)
Summary: Certification & Plan of Care / [facility]  Certification & Plan of Care / [facility]   Imported By: Lennie Odor 12/12/2010 09:52:37  _____________________________________________________________________  External Attachment:    Type:   Image     Comment:   External Document

## 2010-12-19 NOTE — Miscellaneous (Signed)
Summary: OT Order/[facility]  OT Order/[facility]   Imported By: Sherian Rein 12/12/2010 12:36:04  _____________________________________________________________________  External Attachment:    Type:   Image     Comment:   External Document

## 2010-12-19 NOTE — Miscellaneous (Signed)
Summary: Face to Face Encounter / [facility]  Face to Face Encounter / [facility]   Imported By: Lennie Odor 12/12/2010 09:51:25  _____________________________________________________________________  External Attachment:    Type:   Image     Comment:   External Document

## 2010-12-21 ENCOUNTER — Telehealth: Payer: Self-pay | Admitting: *Deleted

## 2010-12-21 NOTE — Telephone Encounter (Signed)
I called [facility] to verify pt's status. Per Misty Stanley pt had multiple OT/PT home visits and has been DC'd. I advised her pt's son is requesting addtl PT for unsteady gait. I [facility]ed new referral to [facility] at [phone].

## 2010-12-25 ENCOUNTER — Telehealth: Payer: Self-pay | Admitting: *Deleted

## 2010-12-25 DIAGNOSIS — R269 Unspecified abnormalities of gait and mobility: Secondary | ICD-10-CM

## 2010-12-25 NOTE — Telephone Encounter (Signed)
OK to fill this prescription with additional refills x3 Thank you!  

## 2010-12-25 NOTE — Telephone Encounter (Signed)
[facility] called - Pt needs referral for outpatient PT, does not qualify for [facility] b/c pt is no longer home bound. Please put in referral, [facility]

## 2010-12-25 NOTE — Telephone Encounter (Signed)
Rec Rf req for Lorazepam 2 mg.........Marland Kitchen 1 po tid #90.......Marland Kitchen Last Rf 11/24/10...ok to Rf??

## 2010-12-26 ENCOUNTER — Telehealth: Payer: Self-pay | Admitting: *Deleted

## 2010-12-26 NOTE — Telephone Encounter (Signed)
Ok to ref Thank you!  

## 2010-12-26 NOTE — Telephone Encounter (Signed)
Rf phoned in.  [facility] called - Pt needs referral for outpatient PT, does not qualify for [facility] b/c pt is no longer home bound. Please put in referral, [facility]

## 2010-12-26 NOTE — Telephone Encounter (Signed)
Patient requesting refill of ativan - she is out today. OK?

## 2010-12-27 ENCOUNTER — Telehealth: Payer: Self-pay | Admitting: *Deleted

## 2010-12-27 NOTE — Telephone Encounter (Signed)
Yes per MD, pt aware

## 2010-12-27 NOTE — Telephone Encounter (Signed)
Pt stated that she already got med, did not need rf at this time

## 2010-12-27 NOTE — Telephone Encounter (Signed)
ok 

## 2010-12-27 NOTE — Telephone Encounter (Signed)
Patient requesting to know if it is ok to drink Glucerna?

## 2010-12-29 ENCOUNTER — Telehealth: Payer: Self-pay | Admitting: *Deleted

## 2010-12-29 MED ORDER — ZOLPIDEM TARTRATE 10 MG PO TABS: 10.0000 mg | ORAL_TABLET | Freq: Every evening | ORAL | Status: DC | PRN

## 2010-12-29 NOTE — Telephone Encounter (Signed)
OK to fill this prescription with additional refills x2 Thank you!  

## 2010-12-29 NOTE — Telephone Encounter (Signed)
rec rf req for Zolpidem 10mg  1 po qhs..... # 30. Last filled 11-26-10.... Ok to Rf?

## 2010-12-29 NOTE — Telephone Encounter (Signed)
rf phoned

## 2011-01-03 ENCOUNTER — Other Ambulatory Visit: Payer: Self-pay

## 2011-01-05 ENCOUNTER — Other Ambulatory Visit: Payer: Self-pay | Admitting: Internal Medicine

## 2011-01-08 NOTE — Telephone Encounter (Signed)
OK to fill this prescription with additional refills x5 Thank you!  

## 2011-01-08 NOTE — Telephone Encounter (Signed)
Ok to fill 

## 2011-01-09 NOTE — Telephone Encounter (Signed)
Ok to Rf? 

## 2011-01-10 ENCOUNTER — Other Ambulatory Visit (INDEPENDENT_AMBULATORY_CARE_PROVIDER_SITE_OTHER): Payer: Medicare Other

## 2011-01-10 DIAGNOSIS — E119 Type 2 diabetes mellitus without complications: Secondary | ICD-10-CM

## 2011-01-10 LAB — BASIC METABOLIC PANEL WITH GFR
BUN: 18 mg/dL (ref 6–23)
CO2: 31 meq/L (ref 19–32)
Calcium: 9.8 mg/dL (ref 8.4–10.5)
Chloride: 98 meq/L (ref 96–112)
Creatinine, Ser: 0.6 mg/dL (ref 0.4–1.2)
GFR: 117.22 mL/min
Glucose, Bld: 166 mg/dL — ABNORMAL HIGH (ref 70–99)
Potassium: 4.4 meq/L (ref 3.5–5.1)
Sodium: 139 meq/L (ref 135–145)

## 2011-01-10 LAB — HEPATIC FUNCTION PANEL
ALT: 13 U/L (ref 0–35)
AST: 12 U/L (ref 0–37)
Albumin: 3.9 g/dL (ref 3.5–5.2)
Alkaline Phosphatase: 58 U/L (ref 39–117)
Bilirubin, Direct: 0.1 mg/dL (ref 0.0–0.3)
Total Bilirubin: 0.6 mg/dL (ref 0.3–1.2)
Total Protein: 6.3 g/dL (ref 6.0–8.3)

## 2011-01-10 NOTE — Telephone Encounter (Signed)
OK to fill this prescription with additional refills x11 Thank you!  

## 2011-01-11 NOTE — Telephone Encounter (Signed)
Ok to rf? 

## 2011-01-11 NOTE — Telephone Encounter (Signed)
OK to fill this prescription with additional refills x11 Thank you!  

## 2011-01-12 ENCOUNTER — Ambulatory Visit (INDEPENDENT_AMBULATORY_CARE_PROVIDER_SITE_OTHER)
Admission: RE | Admit: 2011-01-12 | Discharge: 2011-01-12 | Disposition: A | Discharge status: Home / Self Care | Payer: Medicare Other | Source: Ambulatory Visit | Attending: Internal Medicine | Admitting: Internal Medicine

## 2011-01-12 ENCOUNTER — Other Ambulatory Visit (INDEPENDENT_AMBULATORY_CARE_PROVIDER_SITE_OTHER): Payer: Medicare Other

## 2011-01-12 ENCOUNTER — Encounter: Payer: Self-pay | Admitting: Internal Medicine

## 2011-01-12 ENCOUNTER — Other Ambulatory Visit: Payer: Medicare Other

## 2011-01-12 ENCOUNTER — Telehealth: Payer: Self-pay | Admitting: Internal Medicine

## 2011-01-12 ENCOUNTER — Ambulatory Visit (INDEPENDENT_AMBULATORY_CARE_PROVIDER_SITE_OTHER): Payer: Medicare Other | Admitting: Internal Medicine

## 2011-01-12 DIAGNOSIS — E538 Deficiency of other specified B group vitamins: Secondary | ICD-10-CM

## 2011-01-12 DIAGNOSIS — R634 Abnormal weight loss: Secondary | ICD-10-CM

## 2011-01-12 DIAGNOSIS — G8929 Other chronic pain: Secondary | ICD-10-CM

## 2011-01-12 DIAGNOSIS — J449 Chronic obstructive pulmonary disease, unspecified: Secondary | ICD-10-CM

## 2011-01-12 DIAGNOSIS — F329 Major depressive disorder, single episode, unspecified: Secondary | ICD-10-CM

## 2011-01-12 DIAGNOSIS — E559 Vitamin D deficiency, unspecified: Secondary | ICD-10-CM

## 2011-01-12 DIAGNOSIS — R269 Unspecified abnormalities of gait and mobility: Secondary | ICD-10-CM

## 2011-01-12 LAB — CBC WITH DIFFERENTIAL/PLATELET
Basophils Absolute: 0 10*3/uL (ref 0.0–0.1)
Basophils Relative: 0.3 % (ref 0.0–3.0)
Eosinophils Absolute: 0.1 10*3/uL (ref 0.0–0.7)
Eosinophils Relative: 0.6 % (ref 0.0–5.0)
HCT: 45.6 % (ref 36.0–46.0)
Hemoglobin: 15.6 g/dL — ABNORMAL HIGH (ref 12.0–15.0)
Lymphocytes Relative: 21.6 % (ref 12.0–46.0)
Lymphs Abs: 2.5 10*3/uL (ref 0.7–4.0)
MCHC: 34.2 g/dL (ref 30.0–36.0)
MCV: 93.5 fl (ref 78.0–100.0)
Monocytes Absolute: 0.5 10*3/uL (ref 0.1–1.0)
Monocytes Relative: 4.2 % (ref 3.0–12.0)
Neutro Abs: 8.5 10*3/uL — ABNORMAL HIGH (ref 1.4–7.7)
Neutrophils Relative %: 73.3 % (ref 43.0–77.0)
Platelets: 247 10*3/uL (ref 150.0–400.0)
RBC: 4.87 Mil/uL (ref 3.87–5.11)
RDW: 13.6 % (ref 11.5–14.6)
WBC: 11.6 10*3/uL — ABNORMAL HIGH (ref 4.5–10.5)

## 2011-01-12 LAB — SEDIMENTATION RATE: Sed Rate: 4 mm/hr (ref 0–22)

## 2011-01-12 LAB — URINALYSIS
Bilirubin Urine: NEGATIVE
Hgb urine dipstick: NEGATIVE
Ketones, ur: NEGATIVE
Leukocytes, UA: NEGATIVE
Nitrite: NEGATIVE
Specific Gravity, Urine: <=1.005 (ref 1.000–1.030)
Total Protein, Urine: NEGATIVE
Urine Glucose: 100
Urobilinogen, UA: 0.2 (ref 0.0–1.0)
pH: 6 (ref 5.0–8.0)

## 2011-01-12 LAB — CK: Total CK: 57 U/L (ref 7–177)

## 2011-01-12 LAB — VITAMIN D 25 HYDROXY (VIT D DEFICIENCY, FRACTURES): Vit D, 25-Hydroxy: 55 ng/mL (ref 30–89)

## 2011-01-12 LAB — VITAMIN B12: Vitamin B-12: 369 pg/mL (ref 211–911)

## 2011-01-12 LAB — TSH: TSH: 0.87 u[IU]/mL (ref 0.35–5.50)

## 2011-01-12 MED ORDER — CIPROFLOXACIN HCL 500 MG PO TABS: 500.0000 mg | ORAL_TABLET | Freq: Two times a day (BID) | ORAL | Status: AC

## 2011-01-12 NOTE — Assessment & Plan Note (Signed)
On meds. Check levels.

## 2011-01-12 NOTE — Assessment & Plan Note (Signed)
Wt Readings from Last 3 Encounters:  01/12/11 110 lb (49.896 kg)  10/11/10 113 lb (51.256 kg)  07/05/10 108 lb (48.988 kg)

## 2011-01-12 NOTE — Assessment & Plan Note (Signed)
On meds prn 

## 2011-01-12 NOTE — Assessment & Plan Note (Signed)
Worse - situational

## 2011-01-12 NOTE — Patient Instructions (Addendum)
Take CoenzymeQ Use hip protector

## 2011-01-12 NOTE — Telephone Encounter (Signed)
[name] , please, inform the patient:  the labs are normal, except for elev WBC and Hgb due to smoking, most likely. Take Cipro x 10 days just in case.   Please, keep  next office visit appointment.   Thank you !

## 2011-01-12 NOTE — Assessment & Plan Note (Signed)
On Meds per pain clinic

## 2011-01-12 NOTE — Progress Notes (Signed)
  Subjective:    Patient ID: [name], female    DOB: 10-Feb-1951, 60 y.o.   MRN: [id number]  HPI  C/o weakness and falling - much worse over past 3 weeks  The patient is here to follow up on chronic depression, anxiety, headaches and chronic moderate pain symptoms controlled prior with medicines, diet and exercise. C/o tremors in R leg, weakness. C/o abd pains x 1 wk - cramps. More regular now...   Review of Systems  Constitutional: Positive for activity change, appetite change and fatigue. Negative for chills.  HENT: Positive for neck pain. Negative for postnasal drip.   Eyes: Negative for redness and itching.  Respiratory: Positive for cough and shortness of breath. Negative for chest tightness.   Cardiovascular: Negative for chest pain and leg swelling.  Genitourinary: Negative for dysuria and urgency.  Musculoskeletal: Positive for back pain, arthralgias and gait problem. Negative for joint swelling.  Neurological: Positive for weakness. Negative for syncope and light-headedness.  Psychiatric/Behavioral: Positive for dysphoric mood. Negative for suicidal ideas and confusion. The patient is nervous/anxious.        Objective:   Physical Exam  Constitutional: She is oriented to person, place, and time. She appears well-developed and well-nourished. No distress.  HENT:  Head: Normocephalic.  Right Ear: External ear normal.  Left Ear: External ear normal.  Nose: Nose normal.  Mouth/Throat: Oropharynx is clear and moist.  Eyes: Conjunctivae are normal. Pupils are equal, round, and reactive to light. Right eye exhibits no discharge. Left eye exhibits no discharge.  Neck: Normal range of motion. Neck supple. No JVD present. No tracheal deviation present. No thyromegaly present.  Cardiovascular: Normal rate, regular rhythm and normal heart sounds.   Pulmonary/Chest: No stridor. No respiratory distress. She has no wheezes.  Abdominal: Soft. Bowel sounds are normal. She exhibits no  distension and no mass. There is no tenderness. There is no rebound and no guarding.  Musculoskeletal: She exhibits no edema and no tenderness.  Lymphadenopathy:    She has no cervical adenopathy.  Neurological: She is alert and oriented to person, place, and time. She displays abnormal reflex. No cranial nerve deficit. She exhibits abnormal muscle tone. Coordination abnormal.       Paraplegic, using a walker  Skin: No rash noted. No erythema.  Psychiatric: She has a normal mood and affect. Her behavior is normal. Judgment and thought content normal.          Assessment & Plan:  WEIGHT LOSS Wt Readings from Last 3 Encounters:  01/12/11 110 lb (49.896 kg)  10/11/10 113 lb (51.256 kg)  07/05/10 108 lb (48.988 kg)     Abnormality of Gait Worse.  R/o UTI. She is starting PT on Mon. Will get labs DEPRESSION Worse - situational  COPD On meds prn  PAIN, CHRONIC NEC On Meds  VITAMIN B12 DEFICIENCY On meds. Check levels.    A complex case

## 2011-01-12 NOTE — Assessment & Plan Note (Signed)
Worse. R/o UTI. She is starting PT on Mon

## 2011-01-15 ENCOUNTER — Ambulatory Visit: Payer: Medicare Other | Attending: Internal Medicine | Admitting: Physical Therapy

## 2011-01-15 DIAGNOSIS — R269 Unspecified abnormalities of gait and mobility: Secondary | ICD-10-CM | POA: Insufficient documentation

## 2011-01-15 DIAGNOSIS — R262 Difficulty in walking, not elsewhere classified: Secondary | ICD-10-CM | POA: Insufficient documentation

## 2011-01-15 DIAGNOSIS — IMO0001 Reserved for inherently not codable concepts without codable children: Secondary | ICD-10-CM | POA: Insufficient documentation

## 2011-01-15 DIAGNOSIS — R279 Unspecified lack of coordination: Secondary | ICD-10-CM | POA: Insufficient documentation

## 2011-01-15 NOTE — Telephone Encounter (Signed)
Pt informed

## 2011-01-15 NOTE — Telephone Encounter (Signed)
No answer at numbers listed... Will try again later

## 2011-01-22 ENCOUNTER — Other Ambulatory Visit: Payer: Self-pay | Admitting: Internal Medicine

## 2011-01-23 ENCOUNTER — Ambulatory Visit: Payer: Medicare Other | Admitting: Physical Therapy

## 2011-01-26 ENCOUNTER — Ambulatory Visit: Payer: Medicare Other | Admitting: Physical Therapy

## 2011-01-30 ENCOUNTER — Ambulatory Visit: Payer: Medicare Other | Attending: Internal Medicine | Admitting: Physical Therapy

## 2011-01-30 DIAGNOSIS — R279 Unspecified lack of coordination: Secondary | ICD-10-CM | POA: Insufficient documentation

## 2011-01-30 DIAGNOSIS — R269 Unspecified abnormalities of gait and mobility: Secondary | ICD-10-CM | POA: Insufficient documentation

## 2011-01-30 DIAGNOSIS — R262 Difficulty in walking, not elsewhere classified: Secondary | ICD-10-CM | POA: Insufficient documentation

## 2011-01-30 DIAGNOSIS — IMO0001 Reserved for inherently not codable concepts without codable children: Secondary | ICD-10-CM | POA: Insufficient documentation

## 2011-02-01 ENCOUNTER — Ambulatory Visit: Payer: Medicare Other | Admitting: Physical Therapy

## 2011-02-05 ENCOUNTER — Ambulatory Visit: Payer: Medicare Other | Admitting: Physical Therapy

## 2011-02-07 ENCOUNTER — Ambulatory Visit: Payer: Medicare Other | Admitting: Physical Therapy

## 2011-02-13 ENCOUNTER — Ambulatory Visit: Payer: Medicare Other | Admitting: Physical Therapy

## 2011-02-13 NOTE — Discharge Summary (Signed)
NAMESAVVY, Jackson                  ACCOUNT NO.:  [id number]   MEDICAL RECORD NO.:  [id number]          PATIENT TYPE:  INP   LOCATION:  5501                         FACILITY:  [facility]   PHYSICIAN:  Valerie A. Felicity Jackson, MDDATE OF BIRTH:  August 12, 1951   DATE OF ADMISSION:  06/16/2007  DATE OF DISCHARGE:  06/17/2007                               DISCHARGE SUMMARY   DISCHARGE DIAGNOSES:  1. Bilateral subdural hematoma status post falls prior to admission.  2. Neuropathy with chronic pain syndrome.  3. Anxiety.  4. History of cerebrovascular accident.  5. Remote history of pancreatitis.   HISTORY OF PRESENT ILLNESS:  [name] is a 60 year old female who was  admitted on June 15, 2007 with chief complaint of dizziness and  nausea.  She has a past medical history which includes chronic pain  syndrome for which she is treated with chronic narcotics.  She presented  with acute dizziness and emesis on the day of admission.  She apparently  had a fall approximately 3 weeks ago which was related to an imbalance  spell.  She had headache as her additional complaint at the time of  admission.  She was found by CT to have a positive bilateral subdural  hematoma and was admitted for further evaluation and treatment.   PAST MEDICAL HISTORY:  1. Pancreatitis 20 years ago.  2. History of hernia repair and adhesiolysis.  3. History of septic shock in 1999 complicated by watershed CVA.  4. Chronic neuropathy.   HOSPITAL COURSE:  Bilateral subdural hematoma.  The patient was admitted  and was seen in consultation by Dr. Hilda Lias.  Upon further  discussion today per Dr. Jeral Fruit, he and the patient have decided to  repeat CAT scan in 1 week as an outpatient.  If the patient's subdural  hematoma worsens, she may need a bur hold evacuation.  Defer management  to Dr. Jeral Fruit.  She has been instructed that if her symptoms worsen,  that she is to come directly to the [facility] emergency room.  She  is  also to follow up with Dr. Jeral Fruit in 1 week in the office.   The patient was noted to become somewhat anxious and mildly agitated  towards the end of her admission.  She admits to drinking 2 glasses of  wine per day at home; however, multiple falls in the setting of narcotic  use raise the possibility that the patient may be drinking more than she  admits to.  She is instructed to avoid alcohol and is also instructed to  quit smoking.  She agrees to a nicotine patch.   DISCHARGE MEDICATIONS:  1. Nicotine patch 21 mg to be changed once daily.  2. Detrol LA 4 mg p.o. daily.  3. Rocaltrol 0.25 mg p.o. daily.  4. Colace 200 mg p.o. daily.  5. Kadian 80 mg p.o. b.i.d.  6. Oxycodone/Tylenol 10/325 mg p.o. q.6h. as needed.  7. Ativan 2 mg p.o. t.i.d.  8. Ambien 10 mg p.o. daily.  9. Calcium plus D as before.  10.Calcitriol 0.25 mg p.o. daily.  PERTINENT LABORATORY DATA AT TIME OF DISCHARGE:  BUN 6, creatinine 0.61,  glucose 114.  Hemoglobin 14.9, hematocrit 42.7.   DISPOSITION:  Plan to discharge the patient to home.   FOLLOW UP:  The patient is instructed to follow up with Dr. Posey Rea in  1-2 weeks.  She will be scheduled for a CT of the head in 1 week and is  instructed to follow up with Dr. Jeral Fruit in the office in 1 week.      Janet Craze, NP      Janet Rover. Felicity Coyer, MD  Electronically Signed    MO/MEDQ  D:  06/17/2007  T:  06/17/2007  Job:  [id number]   cc:   Georgina Quint. Plotnikov, MD  Hilda Lias, M.D.

## 2011-02-13 NOTE — H&P (Signed)
[name], KHADER NO.:  [id number]   MEDICAL RECORD NO.:  [id number]          PATIENT TYPE:  EMS   LOCATION:  ED                           FACILITY:  [facility]   PHYSICIAN:  Bevelyn Buckles. Bensimhon, MDDATE OF BIRTH:  12-13-1950   DATE OF ADMISSION:  06/15/2007  DATE OF DISCHARGE:                              HISTORY & PHYSICAL   PRIMARY CARE Hennessy Bartel:  Georgina Quint. Plotnikov, M.D.   ATTENDING PHYSICIAN:  [facility], Valerie A. Felicity Coyer, M.D.   CHIEF COMPLAINT:  Dizziness and nausea.   HISTORY OF PRESENT ILLNESS:  This 60 year old white female with a past  medical history significant for chronic pain syndrome on chronic  narcotics who presents with acute dizziness and emesis today.  The  patient reported sitting on the commode when she developed sudden onset  of acute dizziness.  This was not vertigo but rather a sense of  imbalance.  She felt very weak and pale.  She did not lose  consciousness.  She called for her husband who came and monitored her  and ultimately brought her to the emergency department.   Of note, the patient reports intermittent headache for the past week.  She reports a headache described on the top of her head.  It is also  worse on the right side greater than the left.  It is not associated  with any paresthesia, weakness, numbness or tingling.  She denies any  aphasia.  Of note, she did fall approximately 3 weeks ago related to  another spell of imbalance.  She also reports a history of chronic gait  disturbances related to her previous stroke.   The patient has recently been treated with Levaquin for the past month  for what was presumed to be sinusitis causing headaches.   REVIEW OF SYSTEMS:  All other systems were thoroughly reviewed, and are  negative except as mentioned above in the history of present illness.   PAST MEDICAL HISTORY:  1. Pancreatitis approximately 20 years ago.  2. History of hernia repair as well as  status post adhesiolysis.  3. History of septic shock in 1999 complicated by watershed      cerebrovascular accident.  4. Chronic neuropathy.   SOCIAL HISTORY:  The patient lives at home in [place] with her  husband.  She drinks approximately 2 glasses of wine per day.  She  denies any liquor or beer use.  She denies IV drug use.  She does report  a smoking history of one pack per day.   FAMILY HISTORY:  The patient's brother died secondary to a brain tumor  in his 20s.   ALLERGIES:  There are no known drug allergies.   MEDICATIONS:  1. Kadian (oral morphine) 80 mg b.i.d.  2. Oxycodone/Tylenol 10/325 mg q.6 h. p.r.n.  3. Ativan 2 mg t.i.d.  4. Ambien 10 mg at bedtime.  5. Detrol LA 4 mg at bedtime.  6. Calcium with vitamin D.  7. Calcitriol 0.25 mg daily.   PHYSICAL EXAMINATION:  VITAL SIGNS:  Afebrile.  Blood pressure 120/80,  pulse 90,  respirations 20.  GENERAL:  No acute distress, alert, oriented times 3.  HEENT:  Pupils are equal, round, react to light and accommodation.  Oropharynx is pink and moist without any lesions.  Tongue is midline.  Uvula is midline.  NECK:  No JVD.  CARDIOVASCULAR:  Regular rate and rhythm.  No murmurs, gallops or rubs.  CHEST:  Clear to auscultation bilaterally.  ABDOMEN:  Soft, nontender, nondistended.  EXTREMITIES:  No clubbing, cyanosis or edema.  SKIN:  No rashes.  NEUROLOGIC:  Alert, oriented times 3.  Cranial nerves II through XII are  grossly intact with no focal deficits.  Deep tendon reflexes are brisk  throughout the lower extremities at 3+ bilaterally.  Toes are downgoing  bilaterally.  Cerebellar function is intact with finger-to-nose as well  as rapid alternating movements.  Gross sensation was intact.   LABORATORY DATA:  Pertinent findings include a normal CBC with  hypokalemia 3.1.  Urinalysis was unremarkable.  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  There was  no midline shift.   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  1. Subdural hematoma.  It is unclear the etiology of this hematoma as      it does have elements of both being acute and chronic.  I wonder if      this was related to the fall that she had approximately 3 weeks      ago.  It is unclear what the precipitant to her fall was.  It may      be related to the fact that she is on narcotics versus her      undiagnosed neuropathy versus other etiologies.  We will check a      B12 and folate level and check for neuropathy.  The case was      discussed by the emergency department physicians with the      neurosurgical staff, specifically Dr. Jeral Fruit was contacted.  Dr.      Jeral Fruit has requested that the patient be transferred to [facility]      for closer monitoring as well as for potential neurosurgical      evaluation in the morning.  Neurosurgery will be consulted      formally.  In the interim time, we will monitor the patient closely      on telemetry as well as with q.2 h. neurologic checks by nursing.      We will hold all forms of anticoagulation including deep vein      thrombosis prophylaxis.  2. Chronic pain syndrome.  The patient is requesting both her pain      medicines and her antianxiety medications.  I explained to her that      we do not want to alter her examination artificially with      medications; however, we will continue her on her home medications      which include Kadian 80 mg b.i.d., oxycodone 10 mg q.6 h.  We will      also continue on Ativan and  Ambien.  3. Fluids, electrolytes,  nutrition.  Normal saline with 20 mEq of      potassium at 75 mL per hour.  We will give the      patient 20 mEq of potassium now and place her on a regular diet.  4. Prophylaxis.  For deep vein thrombosis prophylaxis, we will use      pneumatic compression devices.  We will not use heparin products as      she has a subdural hematoma.      Bevelyn Buckles. Bensimhon, MD  Electronically Signed     DRB/MEDQ  D:  06/16/2007  T:  06/16/2007  Job:  [id number]

## 2011-02-13 NOTE — Consult Note (Signed)
[name], [name] NO.:  [id number]   MEDICAL RECORD NO.:  [id number]          PATIENT TYPE:  INP   LOCATION:  5501                         FACILITY:  [facility]   PHYSICIAN:  Hilda Lias, M.D.   DATE OF BIRTH:  [birth year]   DATE OF CONSULTATION:  DATE OF DISCHARGE:                                 CONSULTATION   [name] is a lady who was seen in the emergency room, because of some  headache and some dizziness.  She has no history of whatsoever of any  trauma.  She has no history of taking any medication for coagulopathy.  She denies any heavy intake of alcohol.  The patient's history is  completely normal, and she takes one glass of wine at night time.  Nevertheless, the reason we were called and later on admitted by the  medical service is because it was found that she has bilateral subdural  hematoma, left worse than the right one.  Today, clinically she is  stable.  Vital signs are normal.  She has had mild headache, which is  mostly bifrontal.  Cranial nerves:  She is oriented x3.  The cranial  nerves are normal.  Strength is normal in the upper and lower  extremities.  Reflexes symmetrical.  Sensation normal.  CT scan showed  that she has had bilateral subdural hematoma, left worse than the right  one, with some fresh bleed.  There is no evidence of any shift.   CLINICAL IMPRESSION:  Bilateral subdural hematoma in a lady with not any  evidence of any medication for anticoagulation or any trauma.   RECOMMENDATIONS:  We are going to wait and do some blood tests, to rule  out any other possibility why this lady might have a spontaneous  subdural hematoma.  We have tried to rule out any coagulopathy, tried to  rule out any possibility of leukemia, etc.  If everything is completely  normal, probable this lady is going to need bilateral burr hole.  I was  able to speak with her, and she fully agree with the decision.  I have  left my phone number, so her  husband and call me anytime.           ______________________________  Hilda Lias, M.D.     EB/MEDQ  D:  06/16/2007  T:  06/16/2007  Job:  [id number]

## 2011-02-15 ENCOUNTER — Ambulatory Visit: Payer: Medicare Other | Admitting: Physical Therapy

## 2011-02-15 ENCOUNTER — Encounter: Payer: Self-pay | Admitting: Internal Medicine

## 2011-02-16 NOTE — H&P (Signed)
[facility]  Patient:    [name], [name]                      MRN: [id number] Adm. Date:  [id number] Attending:  Thyra Breed CC:         [name], M.D. [facility]  Genene Churn. Love, M.D.  Fortunato Curling, M.D.   History and Physical  [place] comes in for followup evaluation.  Since her last visit about a week or two following this she developed increasing problems with ambulation.  She contacted Dr. Posey Rea who subsequently sent her to Dr. Sandria Manly who saw her two weeks ago.  He obtained an MRI of her brain which shows that she does have cerebral atrophic changes with chronic small vessel ischemic changes of the cerebrum with remote lacunar infarctions in the white matter and small vessel disease of the pons.  She is having difficulties walking, dragging her left foot.  She notes that she does not have any focal pain shooting down the leg but the entire left side of her body is hurting worse.  She is not as well controlled as she was on her last visit on December 02, 2000.  She rates her pain at 8.5/10.  Her shoulder and her leg are bothering her.  She is in physical therapy at [facility].  CURRENT MEDICATIONS: 1. Methadone 5 mg q.6h. 2. Lorazepam 2 mg t.i.d. 3. Ambien. 4. Tamazepam at night. 5. Paxil 40 mg q.h.s. 6. Detrol. 7. Prevacid. 8. Senokot.  PHYSICAL EXAMINATION  VITAL SIGNS:  Blood pressure 109/72, heart rate 83, respiratory rate 20, O2 saturation 94%, pain level 8/10.  EXTREMITIES:  Her shoulder range of motion is unchanged from her last visit with mild restriction range of motion.  She has hyperreflexia of the lower extremities, left greater than right, with what appears to be weakness of the dorsiflexor on the left side to a greater extent than the right.  IMPRESSION: 1. 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 2. Left foot drop which may be related to #1 or to her underlying lumbar  spondylosis. 3. Other medical problems per Dr. Posey Rea.  DISPOSITION:  Discontinue the methadone for two weeks and begin MS Contin 30 mg one p.o. q.12h. #30 with no refill.  She is not allergic to morphine as far as she can recall.  After two weeks she is to return back to the methadone. Follow-up with me in four weeks.  She was encouraged to follow-up closely with Dr. Sandria Manly. DD:  01/27/01 TD:  01/27/01 Job: [id number]

## 2011-02-16 NOTE — Op Note (Signed)
NAME:  [name], [name]                   ACCOUNT NO.:  [id number]   MEDICAL RECORD NO.:  [id number]                   PATIENT TYPE:  AMB   LOCATION:  ENDO                                 FACILITY:  [facility]   PHYSICIAN:  Anselmo Rod, M.D.               DATE OF BIRTH:  1950-11-18   DATE OF PROCEDURE:  04/21/2003  DATE OF DISCHARGE:                                 OPERATIVE REPORT   PROCEDURE PERFORMED:  Colonoscopy with snare polypectomy times two.   ENDOSCOPIST:  Charna Elizabeth, M.D.   INSTRUMENT USED:  Olympus video colonoscope changed to a pediatric  adjustable colonoscope.   INDICATIONS FOR PROCEDURE:  Bright red blood per rectum in a 60 year old  white female with history of constipation alternating with diarrhea.  Rule  out colonic polyps, etc.  The patient is on disability and is on multiple  pain medications including oxycodone.   PREPROCEDURE PREPARATION:  Informed consent was procured from the patient.  The patient was fasted for eight hours prior to the procedure and prepped  with a bottle of magnesium citrate and a gallon of GoLYTELY the night prior  to the procedure.   PREPROCEDURE PHYSICAL:  The patient had stable vital signs.  Neck supple.  Chest clear to auscultation.  Abdomen soft with normal bowel sounds.  A well  healed surgical scar is present.   DESCRIPTION OF PROCEDURE:  The patient was placed in left lateral decubitus  position and sedated with 180 mg of Demerol and 18 mg of Versed  intravenously.  Once the patient was adequately sedated and maintained on  low flow oxygen and continuous cardiac monitoring, the Olympus video  colonoscope was advanced from the rectum to the midright colon with extreme  difficulty.  The patient was combative and was difficult to sedate in spite  of large doses of medications used.  The patient's position was changed from  the left lateral to the supine and to the right lateral position with gentle  application of  abdominal pressure to reach the proximal right colon.  There  was a large amount of stool in the cecum and proximal right colon.  Therefore visualization in this area was difficult.  In spite of multiple  washes in this area, stool could not be all suctioned out.  Adult video  colonoscope was withdrawn and the pediatric adjustable colonoscope used  instead.  However, the procedure was aborted in the midright colon because  of residual stool in the colon.  A large flat polyp was snared from 90 cm.  Another smaller flat polyp was snared from 40 cm.  There was no evidence of  diverticulosis.  Retroflexion in the rectum revealed small internal  hemorrhoids.  The patient tolerated the procedure well without complication.   IMPRESSION:  1. Small internal hemorrhoids.  2. Two polyps snared from the colon, one at 40 cm, one at 90 cm.  3. Large amount of residual stool in  the proximal right colon.  Procedure     aborted midright colon.   RECOMMENDATIONS:  1. Await pathology results.  2. Avoid all nonsteroidals including aspirin for the next two weeks.  3. Air contrast barium enema within the next month.  4. Outpatient follow-up after the barium enema has been done.                                                   Anselmo Rod, M.D.    JNM/MEDQ  D:  04/21/2003  T:  04/21/2003  Job:  [id number]   cc:   Georgina Quint. Plotnikov, M.D. [facility]   Cynthia P. Romine, M.D.  [address]  [place]  [place] [address]  Fax: [phone]   Kathrin Penner. Vear Clock, M.D.  [address]  [place]  [place] [address]  Fax: [phone]   Genene Churn. Love, M.D.  [address]  [place]  [place] [address]  Fax: [phone]   Maretta Bees. Vonita Moss, M.D.  [address]  [place]  [place] [address]  Fax: [phone]   Suzanna Obey, M.D.  [address] [place]  [place] [address]  Fax: [phone]

## 2011-02-16 NOTE — H&P (Signed)
[facility]  Patient:    [name], [name]                      MRN: [id number] Adm. Date:  [id number] Attending:  Thyra Breed CC:         [name], M.D. [facility]  Genene Churn. Love, M.D.  Dr. Katrinka Blazing   History and Physical  [name] comes in for a followup evaluation complaining predominantly of her left-sided body pain.  Since her last evaluation she has been on the amantadine and her husband notes that maybe her movements are improving somewhat.  She comes in using a cane today and had been using a walker previously.  She rates her pain at 5/10.  She did note that cycling off the methadone for a couple weeks onto the MS Contin and going back on the methadone recently has helped reduce some of her discomfort.  She is currently on the methadone 5 mg q.6h.  MEDICATIONS: 1. Lorazepam 2 mg t.i.d. 2. Ambien 10 mg q.h.s. 3. Temazepam 15 mg q.h.s. 4. Paxil 40 mg q.d.  PHYSICAL EXAMINATION  VITAL SIGNS:  Blood pressure 127/78, heart rate 98, respiratory rate 20, O2 saturation 93%, pain level 5/10.  EXTREMITIES:  She has mild to moderate restriction of range of motion of her left shoulder.  Her lower extremities continue to show hyperreflexia.  Her gait is characterized by short, ______ steps with a wide based gait.  IMPRESSION: 1. 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 2. Lumbar spondylosis. 3. Other medical problems per Dr. Posey Rea.  DISPOSITION: 1. Continue on the methadone at 5 mg q.6h. 2. Other medications per Dr. Katrinka Blazing and Dr. Posey Rea as well as Dr. Sandria Manly. 3. Follow-up with me in four to eight weeks. DD:  02/24/01 TD:  02/24/01 Job: [id number]

## 2011-02-16 NOTE — H&P (Signed)
[facility]  Patient:    [name], [name]                      MRN: [id number] Adm. Date:  [id number] Attending:  Thyra Breed CC:         [name], M.D. [facility]  Fortunato Curling, M.D.   History and Physical  FOLLOWUP EVALUATION:  Shanyce developed migrainous-type headaches while she was on the OxyContin.  She was taken off of this on Friday and they have cleared up as she has gone back on the methadone at 5 mg q.6h.  Her pain is under excellent control, being back on the ______ ; she describes it as 0/10.  She is tolerating the methadone well.  Other medications include Prevacid, Paxil, Ditropan, Ambien, Ativan and temazepam as well as Loestrin.  PHYSICAL EXAMINATION  VITAL SIGNS:  Blood pressure is 147/70, heart rate is 98, respiratory rate is 20, O2 saturation is 96% and pain level is 0/10.  EXTREMITIES:  She has some mild limitation of range of motion of her left shoulder.  She continues with hyperreflexia of the lower extremities.  She continues to ambulate with the use of ______ due to her spasticity.  IMPRESSION 1. 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 2. Low back pain on the basis of lumbar spondylosis. 3. Other medical problems per Dr. Trinna Post Plotnikov.  DISPOSITION 1. Continue on current dose of methadone. 2. Follow up with me in eight weeks.  She will need to swing by for another    prescription between now and her next appointment.  She presented today    with her husband. DD:  12/02/00 TD:  12/03/00 Job: [id number] NF/[id number]

## 2011-02-16 NOTE — H&P (Signed)
[facility]  Patient:    [name], [name]                      MRN: [id number] Adm. Date:  [id number] Attending:  Thyra Breed CC:         [name], M.D. [facility]                         History and Physical  FOLLOWUP EVALUATION:  Hser comes in for followup evaluation of her central pain syndrome.  Since her previous evaluation, she has done remarkably well on her current doses of medications.  She has been following up with Dr. Jerrye Beavers in pain coping skills and with Lavinia Sharps with physical therapy and feels tremendously better after going through these.  She has minimal side-effects to her medications.  She is currently taking Paxil 40 mg a day, Ambien at night, Prevacid, Ativan 1 mg p.r.n., methadone 10 mg q.8h., medroxyprogesterone, Ditropan and p.r.n. Flexeril as well as ______ .  EXAMINATION  VITAL SIGNS:  Blood pressure 125/59, heart rate is 71, respiratory rate is 20, O2 saturation is 100% and pain level is 0/10.  EXTREMITIES:  She has full range of motion of her right shoulder.  NEUROLOGIC:  Her neuro exam is grossly unchanged from her last visit.  IMPRESSION 1. 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 2. Low back pain on the basis of lumbar spondylosis. 3. Other medical problems per Dr. [name] and    Dr. Algis Downs. Courson Cunningham.  DISPOSITION 1. Continue on current dose of methadone. 2. Follow up with me in eight weeks.  She is to let us know when she is    running low on the methadone and we will write her another prescription.    She is showing no signs of tolerance, habituation or addictive behavior    with regard to the opiates. DD:  09/06/00 TD:  09/06/00 Job: [id number]

## 2011-02-16 NOTE — Consult Note (Signed)
[facility]  Patient:    [name], [name]                      MRN: [id number] Proc. Date: 04/04/00 Adm. Date:  [id number] Attending:  Thyra Breed CC:         [name], M.D. [facility]                          Consultation Report  FOLLOWUP EVALUATION:  [name] shows up a day early for followup.  She is having more pain into her left upper extremity and out into her left hand and fingers.  It is a global discomfort similar to what she presented with.  She has not been doing a lot of range of motion exercises with her left upper extremity.  She has just gotten back from [place] and is somewhat depressed about coming back from her home town.  She notes that her pain is made worse in her lower back by standing and using her walker and improved by lying down or sitting still.  She states that her spasticity seems worse.  CURRENT MEDICATIONS:  Methadone 5 mg q.6h., Paxil, Ambien, Prevacid, ______ , Ativan.  EXAMINATION:  VITAL SIGNS:  Blood pressure 131/69, heart rate is 94, respiratory rate is 18, O2 saturation is 93%, pain level is 7/10 and temperature is 100.2.  EXTREMITIES:  Her left shoulder demonstrates limited abduction to about 90 to 100 degrees with limited external rotation to about 10 to 15 degrees.  Her lower extremities demonstrate brisk reflexes at the knees and ankles which are symmetric.  She has only about two to three beats of clonus.  Her spasticity is a little changed from previously.  IMPRESSION: 1. Low back pain on the basis of lumbar spondylosis. 2. Left shoulder pain which I suspect is part of a central pain from her    previous encephalopathy. 3. Fever, which I have encouraged her to go ahead and see Dr. Trinna Post Plotnikov    about. 4. Other medical problems per Dr. Posey Rea.  DISPOSITION: 1. Continue on methadone 5 mg one p.o. q.6h., #120 with no refill. 2. The patient was encouraged to go ahead and see Dr. Posey Rea about her  elevated temperature. 3. The patient was encouraged to resume her exercises for her shoulder as she    is beginning to get an adhesive capsulitis.  I suspect the etiology of her    shoulder discomfort is her underlying central pain from her previous    encephalopathy. 4. I plan to see her back in followup in four weeks. DD:  04/04/00 TD:  04/04/00 Job: [id number]

## 2011-02-16 NOTE — H&P (Signed)
[facility]  Patient:    [name], [name]                      MRN: [id number] Adm. Date:  [id number] Attending:  Thyra Breed CC:         [name], M.D. 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  Since her last evaluation, she has been in the emergency room and was given Vioxx, which upset her stomach significantly.  She rates her pain at 1/10, but she is frustrated with the degree of pain control she has with her methadone.  She actually feels as though it has episodically increased pretty significantly.  She is developing more of an adhesive capsulitis.  She is very worried about her husband, who has been sick and out of work, and money stressors, and she is depressed.  She has not seen Dr. Lodema Hong in a while.  CURRENT MEDICATIONS:  Paxil, Ambien, Prevacid, multivitamins, senna C, Ativan, methadone, Ditropan and Provera with Vivelle-Dot.  PHYSICAL EXAMINATION  VITAL SIGNS:  Blood pressure is 138/80, heart rate is 97, respiratory rate is 16 and O2 saturation is 93%.  EXTREMITIES:  She describes minimal pain today but she does have a moderate restriction in range of motion of her left shoulder.  Her deep tendon reflexes were symmetric in the lower extremities and hyperactive.  She continues to ambulate with the use of a cane because of her spasticity.  IMPRESSION 1. 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 2. Low back pain on the basis of lumbar spondylosis. 3. Other medical problems per Dr. Trinna Post Plotnikov.  DISPOSITION 1. Will hold the methadone for a month and cycle over to OxyContin for one    month.  I explained this to the patient and the fact that she does not need    to combine the two of these.  I will give her OxyContin 20 mg 1 p.o.    q.12h.,  #60 with no refill. 2. She was encouraged to do exercises for her left shoulder, which she is    fully aware of, including stretching exercises. 3. Follow up with me in four weeks, at which time we will cycle back to the    methadone. DD:  11/01/00 TD:  11/02/00 Job: [id number]

## 2011-02-16 NOTE — H&P (Signed)
[facility]  Patient:    [name], [name]                      MRN: [id number] Adm. Date:  [id number] Attending:  Thyra Breed CC:         [name], M.D. [facility]                         History and Physical  This is a followup evaluation.  [name] comes in for follow-up evaluation of her left shoulder discomfort, which is felt to be partially on the basis of a adhesive capsulitis and central neuropathic pain ______.  Since her last evaluation, the patient has noted an increase in left shoulder discomfort despite her current dose of methadone.  She is no longer getting any physical therapy.  She is somewhat stressed by the fact that her husband had gone back to work and she feels like she is getting less attention from him.  PHYSICAL EXAMINATION:  VITAL SIGNS:  Blood pressure 134/69, heart rate 68, respiratory rate 14.  O2 saturation is 96%.  Pain level is 5/10.  EXTREMITIES:  The patient demonstrates good range of motion of her left shoulder with minimal crepitus.  Deep tendon reflexes are unchanged from previously.  CURRENT MEDICATIONS: 1. Ambien 10 mg at night. 2. Prevacid 15 mg. 3. Methadone 5 mg q.6h. 4. Lorazepam. 5. Paxil.  IMPRESSION: 1. Left shoulder pain, most likely secondary to central pain from her    previous neurologic events. 2. Lumbar spondylosis. 3. Other medical problems per Dr. Posey Jackson.  DISPOSITION: 1. Continue on methadone, as previously, she is not due for a prescription of    this at this time. 2. She was given permission to review on glucosamine and encouraged to go    ahead and start this. 3. Followup with me in eight weeks. DD:  01/26/00 TD:  01/29/00 Job: [id number]

## 2011-02-16 NOTE — H&P (Signed)
[facility]  Patient:    [name], [name]                      MRN: [id number] Adm. Date:  [id number] Attending:  Thyra Breed                         History and Physical  FOLLOWUP EVALUATION  [name] comes in for followup evaluation continuing to have increasing problems with left-sided pain.  She has noted that her pain level is down to 8 out of 10, which is mildly improved.  She did see Dr. Lodema Hong who did feel as though she had some significant problems with depression that need to be addressed if she feels as though she can delve into these issues with she being the patient.  She notes her pain is predominantly in the left shoulder and is coming back from previously.  She has been on Duragesic and OxyContin and not responded positively in the past.  She is currently on Flexeril, Paxil, Ambien, Prevacid and Lorazepam.  PHYSICAL EXAMINATION:  VITAL SIGNS:  Blood pressure 131/68, heart rate 97, respiratory rate 18, O2 saturation 94%, pain level 8/10.  NEUROLOGIC:  She has intact range of motion of her shoulders.  She has a spastic gait.  She seems to be more hyperreflexic, left greater than right in the lower extremities, but bilaterally.  IMPRESSION: 1. 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 2. Low back pain on the basis of lumbar spondylosis. 3. Other medical problems per Drs. Plotnikov and Cunningham.  DISPOSITION: 1. Increase methadone to 10 mg one p.o. q.8h. #90 with no refill. 2. Continue on the Flexeril.  The patient is not interested on going on    Baclofen for the time being, but advised her that she probably needed to be    on this based on her gait. 3. Follow up with me in four weeks. DD:  07/12/00 TD:  07/12/00 Job: [id number]

## 2011-02-16 NOTE — Consult Note (Signed)
[facility]  Patient:    [name], [name]                      MRN: [id number] Proc. Date: 03/08/00 Adm. Date:  [id number] Attending:  Thyra Breed CC:         [name], M.D. [facility]                          Consultation Report  HISTORY:  The patient comes in for follow-up evaluation of her central pain syndrome secondary to previous anoxic encephalopathy and a history of seizure disorder.  The patient complains of left shoulder pain which is getting worse. She is somewhat stressed today and teary eyed.  It comes to light that she feels much less supported at home with her current health care providers, and is having to do more for herself.  She continues on methadone 5 mg q.6h., and is tolerating this well.  In addition, she is on medroxyprogesterone, Paxil, Prevacid and Ambien.  She has been off of Depakote for quite some time, and does not recall ever being treated with Baclofen.  PHYSICAL EXAMINATION:  VITAL SIGNS:  Blood pressure 170/77 which, in comparison to her previous evaluation is barely significantly elevated.  Heart rate is 89, respiratory rate is 18, O2  saturations 95%, pain level is 9/10, temperature 99.3.  NEUROLOGIC:  She has a spastic gait and requires a cane for assistance.  HEAD:  Normocephalic.  EXTREMITIES:  She demonstrates good range of motion of her shoulders and of he lower extremities with negative straight leg raise signs.  She has mild spasticity on range of motion with deep tendon reflexes symmetric in the upper extremities and hyper reflexic in the left relative to the right.  She has about 4 beats of clonus on the left side.  IMPRESSION: 1. Left shoulder pain which I suspect is central in origin from her previous    encephalopathic state. 2. Lumbar spondylosis - quiescent. 3. Other medical problems per Dr. Posey Rea.  DISPOSITION: 1. The patient plans to be in [place] for several weeks, so I advised  her that we    probably should continue her on her current medical regimen and not make many    adjustments while she will be gone for so long and so far away.  I will continue    her on methadone 5 mg, one p.o. q.6h., #120 with no refill. 2. I discussed Baclofen with her, both for the spasticity and possibly for pain    relieving qualities of the medication.  She plans to discuss this with her    husband.  We have given her some additional literature to take home with her. 3. I plan to see her back in follow-up in four weeks. DD:  03/08/00 TD:  03/08/00 Job: [id number]

## 2011-02-16 NOTE — H&P (Signed)
[facility]  Patient:    [name], [name]                      MRN: [id number] Adm. Date:  [id number] Attending:  Thyra Breed CC:         Genene Churn. Love, M.D.  [name], M.D. [facility]  Esmeralda Arthur, M.D.   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  Since her last evaluation, she has improved somewhat.  Her pain level continues to be about 5/10 on average and it is up to 6/10 today. She has been placed on amantadine, which has helped her gait tremendously. She continues with the lorazepam 2 mg three times a day, but overall is getting around with a walker much better than she had at her last visit back in May.  She is having much less pain in her shoulder and left side of her body and feels much better about this overall.  Dr. Genene Churn. Love has weaned her off of Paxil and started her on Wellbutrin and she is doing much better with this.  CURRENT MEDICATIONS:  1. Methadone 5 mg q.6h.  2. Lorazepam 2 mg t.i.d.  3. Ambien 10 mg at night.  4. Temazepam 15 mg at night.  5. Detrol LA 4 mg at night.  6. Prevacid 30 mg per day.  7. Senokot-S for constipation secondary to the methadone.  8. Amantadine 100 mg p.o. b.i.d.  9. Wellbutrin SR 150 mg b.i.d. 10. Progesterone.  PHYSICAL EXAMINATION:  VITAL SIGNS:  Blood pressure 124/75.  Heart rate is 88.  Respiratory rate is 18.  O2 saturation is 95%.  Pain level is 6/10.  NEUROLOGIC:  Her deep tendon reflexes continue to show hyperreflexia.  Her gait has improved fairly significantly from her last visit.  EXTREMITIES:  Left shoulder demonstrates ability to abduct about 105 degrees. There is crepitus on internal and external rotation.  IMPRESSION:  1. Left-sided body pain on the basis of a central pain syndrome, with history     of previous  infarcts.  2. Lumbar spondylosis -- quiescent.  3. Other medical problems per Dr. Trinna Post Plotnikov.  DISPOSITION:  1. Continue on the methadone at current dose.  2. Continue on other medications per other physicians.  3. Follow up with me in eight weeks. DD:  04/07/01 TD:  04/07/01 Job: [id number]

## 2011-02-16 NOTE — Consult Note (Signed)
[facility]  Patient:    [name], [name]                      MRN: [id number] Proc. Date: 06/10/00 Adm. Date:  [id number] Attending:  Thyra Breed CC:         [name], M.D. [facility]   Consultation Report  FOLLOWUP EVALUATION:  Minerva comes in for followup evaluation of her central pain syndrome, predominantly affecting her shoulder.  Since her last evaluation, she is having increasing problems with her left shoulder.  She does not recall any activities which could have exacerbated this.  She rates her pain at 9/10 today.  Her medications are unchanged and she recently got her methadone prescription.  She continues on Paxil, Ambien, Prevacid and Lorazepam but she is walking more.  EXAMINATION  VITAL SIGNS:  Blood pressure is 149/82, heart rate is 87, respiratory rate is 14, O2 saturation is 93%, pain level is 9/10 and temperature is 98.5.  NEUROMUSCULAR:  The patient demonstrates brisk reflexes throughout with a spastic gait.  Range of motion of her left shoulder is intact, with increased pain over the posterior aspect of her left shoulder, especially with resisted abduction.  IMPRESSION:  Left shoulder pain, which is probably on the basis of her central pain syndrome versus from intrinsic shoulder disease.  I suspect she has an element of some tendinitis which may be related to how she ambulates.  DISPOSITION 1. Continue on current doses of medications including methadone at current    dose. 2. Trial of Icy Hot to her left shoulder as needed; if this is not improving    her shoulder pain in two weeks, will consider a trial of a TENS unit.  She    was encouraged to call and let us know how she is doing. 3. I will see her back in followup in four weeks. DD:  06/10/00 TD:  06/10/00 Job: [id number] MW/[id number]

## 2011-02-16 NOTE — H&P (Signed)
[facility]  Patient:    [name], [name]                      MRN: [id number] Adm. Date:  [id number] Attending:  Thyra Breed CC:         [name], M.D. [facility]   History and Physical  FOLLOWUP EVALUATION  [name] comes in for followup evaluation of her left upper extremity pain. Since her previous evaluation, she has done much better with a combination of methadone with Flexeril.  She rates her pain at 1/10.  Her spirits are better, but she is somewhat depressed because it is the anniversary of her daughter dying.  She is doing better overall and she walks in today using a cane.  She apparently drove here today.  CURRENT MEDICATIONS: 1. Methadone 5 mg one p.o. q.6h. 2. Paxil 40 mg per day. 3. Ambien for sleep at night. 4. Prevacid. 5. Lorazepam. 6. She is also taking Flexeril three times a day.  PHYSICAL EXAMINATION:  VITAL SIGNS:  Blood pressure 121/63, heart rate 98, respiratory rate 18, O2 saturation 95%, pain level 1/10.  EXTREMITIES:  She has excellent range of motion of her left shoulder with no pain.  Her lower extremity reflexes continue to be brisk with two to three beats of clonus.  IMPRESSION: 1. Low back pain on the basis of lumbar spondylosis, stable. 2. Left shoulder pain which is suspected of central pain syndrome from her    encephalopathy, improved. 3. Other medical problems per Dr. Posey Rea.  DISPOSITION: 1. Continue on methadone 5 mg one p.o. q.6h. #120 with no refill. 2. Reduce Flexeril 10 mg one p.o. b.i.d. #60 with no refill. 3. Follow up with me in four weeks. 4. The patient was advised that we may be able to spread out her visits to    every two months if she continues to do well at her next visit. DD:  05/03/00 TD:  05/03/00 Job: [id number]

## 2011-02-19 ENCOUNTER — Ambulatory Visit (INDEPENDENT_AMBULATORY_CARE_PROVIDER_SITE_OTHER): Payer: Medicare Other | Admitting: Internal Medicine

## 2011-02-19 ENCOUNTER — Encounter: Payer: Self-pay | Admitting: Internal Medicine

## 2011-02-19 DIAGNOSIS — J449 Chronic obstructive pulmonary disease, unspecified: Secondary | ICD-10-CM

## 2011-02-19 DIAGNOSIS — R634 Abnormal weight loss: Secondary | ICD-10-CM

## 2011-02-19 DIAGNOSIS — G8929 Other chronic pain: Secondary | ICD-10-CM

## 2011-02-19 DIAGNOSIS — R269 Unspecified abnormalities of gait and mobility: Secondary | ICD-10-CM

## 2011-02-19 DIAGNOSIS — J4489 Other specified chronic obstructive pulmonary disease: Secondary | ICD-10-CM

## 2011-02-19 DIAGNOSIS — E119 Type 2 diabetes mellitus without complications: Secondary | ICD-10-CM

## 2011-02-19 DIAGNOSIS — E538 Deficiency of other specified B group vitamins: Secondary | ICD-10-CM

## 2011-02-19 MED ORDER — L-METHYLFOLATE-B6-B12 3-35-2 MG PO TABS: 1.0000 | ORAL_TABLET | Freq: Two times a day (BID) | ORAL | Status: DC

## 2011-02-19 NOTE — Assessment & Plan Note (Signed)
She cont to smoke. Needs to cut back.

## 2011-02-19 NOTE — Assessment & Plan Note (Signed)
Wt Readings from Last 3 Encounters:  02/19/11 109 lb (49.442 kg)  01/12/11 110 lb (49.896 kg)  10/11/10 113 lb (51.256 kg)  She has been eating

## 2011-02-19 NOTE — Assessment & Plan Note (Signed)
Doing well 

## 2011-02-19 NOTE — Assessment & Plan Note (Signed)
Worse now. On PT. Will try Metanx.

## 2011-02-19 NOTE — Progress Notes (Signed)
  Subjective:    Patient ID: [name], female    DOB: 02/27/1951, 60 y.o.   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 UTI caused spasticity.    Review of Systems  Constitutional: Positive for fatigue and unexpected weight change (wt change).  HENT: Negative for voice change.   Respiratory: Negative for cough.   Gastrointestinal: Negative for constipation.  Musculoskeletal: Negative for joint swelling.  Skin: Negative for rash.  Neurological: Negative for dizziness.  Psychiatric/Behavioral: The patient is not nervous/anxious.        Objective:   Physical Exam  Constitutional: She appears well-developed and well-nourished. No distress.       Thin  HENT:  Head: Normocephalic.  Right Ear: External ear normal.  Left Ear: External ear normal.  Nose: Nose normal.  Mouth/Throat: Oropharynx is clear and moist.  Eyes: Conjunctivae are normal. Pupils are equal, round, and reactive to light. Right eye exhibits no discharge. Left eye exhibits no discharge.  Neck: Normal range of motion. Neck supple. No JVD present. No tracheal deviation present. No thyromegaly present.  Cardiovascular: Normal rate, regular rhythm and normal heart sounds.   Pulmonary/Chest: No stridor. No respiratory distress. She has no wheezes.  Abdominal: Soft. Bowel sounds are normal. She exhibits no distension and no mass. There is no tenderness. There is no rebound and no guarding.  Musculoskeletal: She exhibits no edema and no tenderness.  Lymphadenopathy:    She has no cervical adenopathy.  Neurological: She displays normal reflexes. No cranial nerve deficit. She exhibits normal muscle tone. Coordination normal.       In a w/c Weak LEs  Skin: No rash noted. No erythema.  Psychiatric: She has a normal mood and affect. Her behavior is normal. Judgment and thought  content normal.         Lab Results  Component Value Date   WBC 11.6* 01/12/2011   HGB 15.6* 01/12/2011   HCT 45.6 01/12/2011   PLT 247.0 01/12/2011   CHOL 213* 10/10/2010   TRIG 104.0 10/10/2010   HDL 69.10 10/10/2010   LDLDIRECT 141.9 10/10/2010   ALT 13 01/10/2011   AST 12 01/10/2011   NA 139 01/10/2011   K 4.4 01/10/2011   CL 98 01/10/2011   CREATININE 0.6 01/10/2011   BUN 18 01/10/2011   CO2 31 01/10/2011   TSH 0.87 01/12/2011   INR 1.0 ratio 10/21/2009   HGBA1C 7.1* 10/10/2010    Assessment & Plan:  WEIGHT LOSS Wt Readings from Last 3 Encounters:  02/19/11 109 lb (49.442 kg)  01/12/11 110 lb (49.896 kg)  10/11/10 113 lb (51.256 kg)  She has been eating   VITAMIN B12 DEFICIENCY On Rx We can try Metanx  PAIN, CHRONIC NEC On Rx per Pain Clinic - on Dilaudid now  DIABETES MELLITUS, TYPE II Doing well  Abnormality of Gait Worse now. On PT. Will try Metanx.  COPD She cont to smoke. Needs to cut back.

## 2011-02-19 NOTE — Assessment & Plan Note (Signed)
On Rx per Pain Clinic - on Dilaudid now

## 2011-02-19 NOTE — Patient Instructions (Signed)
Please smoke less and go outside to smoke

## 2011-02-19 NOTE — Assessment & Plan Note (Signed)
On Rx We can try Metanx

## 2011-02-21 ENCOUNTER — Ambulatory Visit: Payer: Medicare Other | Admitting: Physical Therapy

## 2011-02-28 ENCOUNTER — Ambulatory Visit: Payer: Medicare Other | Admitting: Physical Therapy

## 2011-03-01 ENCOUNTER — Telehealth: Payer: Self-pay | Admitting: *Deleted

## 2011-03-01 NOTE — Telephone Encounter (Signed)
[name], called stating that there was an issue w/Pt's BSL Mildly elevated and Pt is not properly taking medication or adhering to diet per aide-- Instructed to have Pt take her Glucophage TID as written and make sure she is strictly adhering to diabetic diet plan; check BSL as least TID while getting numbers back into normal range. Call if any questions.

## 2011-03-05 ENCOUNTER — Other Ambulatory Visit: Payer: Self-pay | Admitting: *Deleted

## 2011-03-05 MED ORDER — L-METHYLFOLATE-B6-B12 3-35-2 MG PO TABS: 1.0000 | ORAL_TABLET | Freq: Two times a day (BID) | ORAL | Status: DC

## 2011-03-07 ENCOUNTER — Ambulatory Visit: Payer: Medicare Other | Attending: Internal Medicine | Admitting: Physical Therapy

## 2011-03-07 DIAGNOSIS — R279 Unspecified lack of coordination: Secondary | ICD-10-CM | POA: Insufficient documentation

## 2011-03-07 DIAGNOSIS — IMO0001 Reserved for inherently not codable concepts without codable children: Secondary | ICD-10-CM | POA: Insufficient documentation

## 2011-03-07 DIAGNOSIS — R262 Difficulty in walking, not elsewhere classified: Secondary | ICD-10-CM | POA: Insufficient documentation

## 2011-03-07 DIAGNOSIS — R269 Unspecified abnormalities of gait and mobility: Secondary | ICD-10-CM | POA: Insufficient documentation

## 2011-03-08 ENCOUNTER — Ambulatory Visit: Payer: Medicare Other | Admitting: Physical Therapy

## 2011-03-12 ENCOUNTER — Ambulatory Visit: Payer: Medicare Other | Admitting: Physical Therapy

## 2011-03-14 ENCOUNTER — Ambulatory Visit: Payer: Medicare Other | Admitting: Physical Therapy

## 2011-03-19 ENCOUNTER — Ambulatory Visit: Payer: Medicare Other | Admitting: Physical Therapy

## 2011-03-20 ENCOUNTER — Ambulatory Visit: Payer: Medicare Other | Admitting: Physical Therapy

## 2011-03-21 ENCOUNTER — Ambulatory Visit: Payer: Medicare Other | Admitting: Physical Therapy

## 2011-03-27 ENCOUNTER — Other Ambulatory Visit: Payer: Self-pay | Admitting: Neurology

## 2011-03-27 ENCOUNTER — Ambulatory Visit: Payer: Medicare Other | Admitting: Physical Therapy

## 2011-03-27 DIAGNOSIS — G252 Other specified forms of tremor: Secondary | ICD-10-CM

## 2011-03-27 DIAGNOSIS — G250 Essential tremor: Secondary | ICD-10-CM

## 2011-03-27 DIAGNOSIS — I6350 Cerebral infarction due to unspecified occlusion or stenosis of unspecified cerebral artery: Secondary | ICD-10-CM

## 2011-03-27 DIAGNOSIS — Z8669 Personal history of other diseases of the nervous system and sense organs: Secondary | ICD-10-CM

## 2011-03-27 DIAGNOSIS — G931 Anoxic brain damage, not elsewhere classified: Secondary | ICD-10-CM

## 2011-03-29 ENCOUNTER — Ambulatory Visit: Payer: Medicare Other | Admitting: Physical Therapy

## 2011-03-30 ENCOUNTER — Ambulatory Visit
Admission: RE | Admit: 2011-03-30 | Discharge: 2011-03-30 | Disposition: A | Discharge status: Home / Self Care | Payer: Medicare Other | Source: Ambulatory Visit | Attending: Neurology | Admitting: Neurology

## 2011-03-30 DIAGNOSIS — Z8669 Personal history of other diseases of the nervous system and sense organs: Secondary | ICD-10-CM

## 2011-03-30 DIAGNOSIS — G931 Anoxic brain damage, not elsewhere classified: Secondary | ICD-10-CM

## 2011-03-30 DIAGNOSIS — G252 Other specified forms of tremor: Secondary | ICD-10-CM

## 2011-03-30 DIAGNOSIS — G250 Essential tremor: Secondary | ICD-10-CM

## 2011-03-30 DIAGNOSIS — I6350 Cerebral infarction due to unspecified occlusion or stenosis of unspecified cerebral artery: Secondary | ICD-10-CM

## 2011-04-02 ENCOUNTER — Telehealth: Payer: Self-pay | Admitting: *Deleted

## 2011-04-02 NOTE — Telephone Encounter (Signed)
rec rf req for Zolpidem 10mg  1 po qhs. # 30. Last filled 02-25-11. Ok to Rf?

## 2011-04-03 ENCOUNTER — Ambulatory Visit: Payer: Medicare Other | Attending: Internal Medicine | Admitting: Physical Therapy

## 2011-04-03 DIAGNOSIS — IMO0001 Reserved for inherently not codable concepts without codable children: Secondary | ICD-10-CM | POA: Insufficient documentation

## 2011-04-03 DIAGNOSIS — R279 Unspecified lack of coordination: Secondary | ICD-10-CM | POA: Insufficient documentation

## 2011-04-03 DIAGNOSIS — R262 Difficulty in walking, not elsewhere classified: Secondary | ICD-10-CM | POA: Insufficient documentation

## 2011-04-03 DIAGNOSIS — R269 Unspecified abnormalities of gait and mobility: Secondary | ICD-10-CM | POA: Insufficient documentation

## 2011-04-03 MED ORDER — ZOLPIDEM TARTRATE 10 MG PO TABS: 10.0000 mg | ORAL_TABLET | Freq: Every evening | ORAL | Status: DC | PRN

## 2011-04-03 NOTE — Telephone Encounter (Signed)
OK to fill this prescription with additional refills x5 Thank you!  

## 2011-04-05 ENCOUNTER — Ambulatory Visit: Payer: Medicare Other | Admitting: Physical Therapy

## 2011-04-10 ENCOUNTER — Ambulatory Visit: Payer: Medicare Other | Admitting: Physical Therapy

## 2011-04-11 ENCOUNTER — Telehealth: Payer: Self-pay | Admitting: *Deleted

## 2011-04-11 ENCOUNTER — Other Ambulatory Visit (INDEPENDENT_AMBULATORY_CARE_PROVIDER_SITE_OTHER): Payer: Medicare Other

## 2011-04-11 DIAGNOSIS — R350 Frequency of micturition: Secondary | ICD-10-CM

## 2011-04-11 DIAGNOSIS — R309 Painful micturition, unspecified: Secondary | ICD-10-CM

## 2011-04-11 DIAGNOSIS — N23 Unspecified renal colic: Secondary | ICD-10-CM

## 2011-04-11 LAB — URINALYSIS, ROUTINE W REFLEX MICROSCOPIC
Bilirubin Urine: NEGATIVE
Ketones, ur: NEGATIVE
Nitrite: NEGATIVE
Specific Gravity, Urine: 1.025 (ref 1.000–1.030)
Urine Glucose: NEGATIVE
Urobilinogen, UA: 0.2 (ref 0.0–1.0)
pH: 6 (ref 5.0–8.0)

## 2011-04-11 MED ORDER — CIPROFLOXACIN HCL 250 MG PO TABS: 250.0000 mg | ORAL_TABLET | Freq: Two times a day (BID) | ORAL | Status: AC

## 2011-04-11 NOTE — Telephone Encounter (Signed)
UTI Take Cipro Thx

## 2011-04-11 NOTE — Telephone Encounter (Signed)
Pt c/o urinary frequency & pain. Placed order for u/a - she will try to get in this afternoon to the lab. Hold phone note open for results.

## 2011-04-11 NOTE — Telephone Encounter (Signed)
Results ready, please advise.

## 2011-04-11 NOTE — Telephone Encounter (Signed)
Patient informed. 

## 2011-04-12 ENCOUNTER — Ambulatory Visit: Payer: Medicare Other | Admitting: Physical Therapy

## 2011-04-23 ENCOUNTER — Telehealth: Payer: Self-pay | Admitting: *Deleted

## 2011-04-23 NOTE — Telephone Encounter (Signed)
OK to fill this prescription with additional refills x5 Thank you!  

## 2011-04-23 NOTE — Telephone Encounter (Signed)
Rec Rf req for Lorazepam 2 mg 1 po tid # 90. Last filled # 90. Last filled 03-24-11. Ok to Rf?

## 2011-04-24 MED ORDER — LORAZEPAM 2 MG PO TABS: 2.0000 mg | ORAL_TABLET | Freq: Three times a day (TID) | ORAL | Status: DC

## 2011-04-30 ENCOUNTER — Ambulatory Visit (INDEPENDENT_AMBULATORY_CARE_PROVIDER_SITE_OTHER): Payer: Medicare Other | Admitting: Internal Medicine

## 2011-04-30 ENCOUNTER — Encounter: Payer: Self-pay | Admitting: Internal Medicine

## 2011-04-30 ENCOUNTER — Ambulatory Visit: Payer: Medicare Other | Admitting: Physical Therapy

## 2011-04-30 VITALS — BP 120/72 | HR 90 | Temp 98.8°F | Ht 65.0 in

## 2011-04-30 DIAGNOSIS — E11622 Type 2 diabetes mellitus with other skin ulcer: Secondary | ICD-10-CM

## 2011-04-30 DIAGNOSIS — L97309 Non-pressure chronic ulcer of unspecified ankle with unspecified severity: Secondary | ICD-10-CM

## 2011-04-30 DIAGNOSIS — E1169 Type 2 diabetes mellitus with other specified complication: Secondary | ICD-10-CM

## 2011-04-30 MED ORDER — MUPIROCIN 2 % EX OINT: TOPICAL_OINTMENT | Freq: Two times a day (BID) | CUTANEOUS | Status: DC

## 2011-04-30 MED ORDER — SULFAMETHOXAZOLE-TRIMETHOPRIM 800-160 MG PO TABS: 1.0000 | ORAL_TABLET | Freq: Two times a day (BID) | ORAL | Status: AC

## 2011-04-30 NOTE — Progress Notes (Signed)
  Subjective:    Patient ID: [name], female    DOB: [birth year], 60 y.o.   MRN: [id number]  HPI  complains of R lateral ankle wound Onset 1 month ago following accidental mild trauma - bumped against furniture No fever - no change in chronic pain (works with pain clinic for neuropathic pain) No drainage - no other wounds  Past Medical History  Diagnosis Date  . Depression   . GERD (gastroesophageal reflux disease)   . Seizure disorder   . Gait disorder   . Anoxic brain injury   . Chronic neck pain   . Chronic pain in shoulder   . COPD (chronic obstructive pulmonary disease)   . LBP (low back pain)   . Osteoarthritis   . Type II or unspecified type diabetes mellitus without mention of complication, not stated as uncontrolled 2010  . Anxiety   . Vitamin B 12 deficiency 2011  . Hyperlipidemia     Review of Systems  Constitutional: Negative for fever.  Musculoskeletal: Negative for gait problem.  Neurological: Negative for syncope.       Objective:   Physical Exam BP 120/72  Pulse 90  Temp(Src) 98.8 F (37.1 C) (Oral)  Ht 5\' 5"  (1.651 m)  SpO2 93%  Constitutional: She is oriented to person, place, and time. She appears well-developed and well-nourished. No distress.  spouse at side Cardiovascular: Normal rate, regular rhythm and normal heart sounds.  No murmur heard. No BLE edema. Good pulses BLE Pulmonary/Chest: Effort normal and breath sounds normal. No respiratory distress. She has no wheezes.  Skin: Small subcm superficial ulcer over ankle, R lateral malleolus - surrounding erythema 2cm around ulcer -  Psychiatric: She has a normal mood and affect. Her behavior is normal. Judgment and thought content normal.    Lab Results  Component Value Date   WBC 11.6* 01/12/2011   HGB 15.6* 01/12/2011   HCT 45.6 01/12/2011   PLT 247.0 01/12/2011   CHOL 213* 10/10/2010   TRIG 104.0 10/10/2010   HDL 69.10 10/10/2010   LDLDIRECT 141.9 10/10/2010   ALT 13 01/10/2011   AST 12  01/10/2011   NA 139 01/10/2011   K 4.4 01/10/2011   CL 98 01/10/2011   CREATININE 0.6 01/10/2011   BUN 18 01/10/2011   CO2 31 01/10/2011   TSH 0.87 01/12/2011   INR 1.0 ratio 10/21/2009   HGBA1C 7.1* 10/10/2010       Assessment & Plan:   R ankle wound - small ulceration on lateral malleolus - mild inflammation - tx antibiotics and wound care aggressively given DM and neuropathy Septra, bactoban and soaks pending eval by wound care nurse - erx and orders done

## 2011-04-30 NOTE — Patient Instructions (Signed)
It was good to see you today. Bactroban ointment to cover wound after soaking 2-3x/day, then cover with bandaid Start Septra antibiotics for infection  Your prescription(s) have been submitted to your pharmacy. Please take as directed and contact our office if you believe you are having problem(s) with the medication(s). we'll make referral to home health for wound care. Our office will contact you regarding appointment(s) once made.

## 2011-05-03 ENCOUNTER — Ambulatory Visit: Payer: Medicare Other | Attending: Internal Medicine | Admitting: Physical Therapy

## 2011-05-03 DIAGNOSIS — IMO0001 Reserved for inherently not codable concepts without codable children: Secondary | ICD-10-CM | POA: Insufficient documentation

## 2011-05-03 DIAGNOSIS — R262 Difficulty in walking, not elsewhere classified: Secondary | ICD-10-CM | POA: Insufficient documentation

## 2011-05-03 DIAGNOSIS — R279 Unspecified lack of coordination: Secondary | ICD-10-CM | POA: Insufficient documentation

## 2011-05-03 DIAGNOSIS — R269 Unspecified abnormalities of gait and mobility: Secondary | ICD-10-CM | POA: Insufficient documentation

## 2011-05-11 ENCOUNTER — Telehealth: Payer: Self-pay | Admitting: *Deleted

## 2011-05-11 NOTE — Telephone Encounter (Signed)
Left msg on vm requesting verbal order to continue wear & tear wound care services, and to use the silver Gel 2-3 times a week. Can call office on Monday [phone] with orders

## 2011-05-11 NOTE — Telephone Encounter (Signed)
OK wound care services Thx

## 2011-05-14 NOTE — Telephone Encounter (Signed)
Notified advance spoke with stephanie gave md response.Marland KitchenMarland Kitchen8/13/12@10 :42am./LMB

## 2011-05-16 DIAGNOSIS — S81809A Unspecified open wound, unspecified lower leg, initial encounter: Secondary | ICD-10-CM

## 2011-05-16 DIAGNOSIS — F329 Major depressive disorder, single episode, unspecified: Secondary | ICD-10-CM

## 2011-05-16 DIAGNOSIS — G931 Anoxic brain damage, not elsewhere classified: Secondary | ICD-10-CM

## 2011-05-16 DIAGNOSIS — S81009A Unspecified open wound, unspecified knee, initial encounter: Secondary | ICD-10-CM

## 2011-05-16 DIAGNOSIS — E119 Type 2 diabetes mellitus without complications: Secondary | ICD-10-CM

## 2011-05-16 DIAGNOSIS — F3289 Other specified depressive episodes: Secondary | ICD-10-CM

## 2011-05-16 DIAGNOSIS — S91009A Unspecified open wound, unspecified ankle, initial encounter: Secondary | ICD-10-CM

## 2011-05-18 ENCOUNTER — Telehealth: Payer: Self-pay | Admitting: *Deleted

## 2011-05-18 NOTE — Telephone Encounter (Signed)
FYI - [facility] has completed their visits for wound care. RN feels that pt's caregivers are able to care for wound.

## 2011-05-21 ENCOUNTER — Ambulatory Visit (INDEPENDENT_AMBULATORY_CARE_PROVIDER_SITE_OTHER): Payer: Medicare Other | Admitting: Internal Medicine

## 2011-05-21 ENCOUNTER — Encounter: Payer: Self-pay | Admitting: Internal Medicine

## 2011-05-21 VITALS — BP 100/68 | HR 84 | Temp 98.7°F | Resp 16 | Wt 106.0 lb

## 2011-05-21 DIAGNOSIS — R634 Abnormal weight loss: Secondary | ICD-10-CM

## 2011-05-21 DIAGNOSIS — F329 Major depressive disorder, single episode, unspecified: Secondary | ICD-10-CM

## 2011-05-21 DIAGNOSIS — F411 Generalized anxiety disorder: Secondary | ICD-10-CM

## 2011-05-21 DIAGNOSIS — L8990 Pressure ulcer of unspecified site, unspecified stage: Secondary | ICD-10-CM

## 2011-05-21 DIAGNOSIS — E119 Type 2 diabetes mellitus without complications: Secondary | ICD-10-CM

## 2011-05-21 DIAGNOSIS — L89159 Pressure ulcer of sacral region, unspecified stage: Secondary | ICD-10-CM | POA: Insufficient documentation

## 2011-05-21 DIAGNOSIS — R269 Unspecified abnormalities of gait and mobility: Secondary | ICD-10-CM

## 2011-05-21 DIAGNOSIS — L89109 Pressure ulcer of unspecified part of back, unspecified stage: Secondary | ICD-10-CM

## 2011-05-21 NOTE — Progress Notes (Signed)
  Subjective:    Patient ID: [name], female    DOB: July 27, 1951, 60 y.o.   MRN: [id number]  HPI  C/o a R buttock sore x 1 wk And a R lat malleolus sore C/o wt loss  Review of Systems  Constitutional: Positive for fatigue and unexpected weight change (wt loss). Negative for chills, activity change and appetite change.  HENT: Negative for congestion, mouth sores and sinus pressure.   Eyes: Negative for visual disturbance.  Respiratory: Negative for cough and chest tightness.   Gastrointestinal: Negative for nausea and abdominal pain.  Genitourinary: Negative for frequency, difficulty urinating and vaginal pain.  Musculoskeletal: Positive for arthralgias and gait problem. Negative for back pain.  Skin: Negative for pallor and rash.  Neurological: Negative for dizziness, tremors, weakness, numbness and headaches.  Psychiatric/Behavioral: Positive for sleep disturbance. Negative for confusion. The patient is nervous/anxious.    Wt Readings from Last 3 Encounters:  05/21/11 106 lb (48.081 kg)  02/19/11 109 lb (49.442 kg)  01/12/11 110 lb (49.896 kg)       Objective:   Physical Exam  Constitutional: She appears well-developed. No distress.       Very thin  HENT:  Head: Normocephalic.  Right Ear: External ear normal.  Left Ear: External ear normal.  Nose: Nose normal.  Mouth/Throat: Oropharynx is clear and moist.  Eyes: Conjunctivae are normal. Pupils are equal, round, and reactive to light. Right eye exhibits no discharge. Left eye exhibits no discharge.  Neck: Normal range of motion. Neck supple. No JVD present. No tracheal deviation present. No thyromegaly present.  Cardiovascular: Normal rate, regular rhythm and normal heart sounds.   Pulmonary/Chest: No stridor. No respiratory distress. She has no wheezes.  Abdominal: Soft. Bowel sounds are normal. She exhibits no distension and no mass. There is no tenderness. There is no rebound and no guarding.  Musculoskeletal: She  exhibits tenderness (shoulders). She exhibits no edema.  Lymphadenopathy:    She has no cervical adenopathy.  Neurological: She displays abnormal reflex. No cranial nerve deficit. She exhibits abnormal muscle tone. Coordination abnormal.       walker  Skin: No rash noted. No erythema.       Sacral ulcer st 2 9mm R malleolus 2 mm  Psychiatric: Her behavior is normal. Judgment and thought content normal.          Assessment & Plan:

## 2011-05-21 NOTE — Patient Instructions (Signed)
Wt Readings from Last 3 Encounters:  05/21/11 106 lb (48.081 kg)  02/19/11 109 lb (49.442 kg)  01/12/11 110 lb (49.896 kg)   We can use duoderm

## 2011-05-24 ENCOUNTER — Telehealth: Payer: Self-pay | Admitting: *Deleted

## 2011-05-24 NOTE — Telephone Encounter (Signed)
I think that she should cut back on smoking first and go to 1/2 ppd. The more she smokes the more wt loss she gets. I would be worried about thromboembolic potential of Megace in her case. Thx

## 2011-05-24 NOTE — Telephone Encounter (Signed)
RN with [facility] visited pt today to care for pressure wounds. They were discussing her weight loss and [facility] is wondering if Megace would be helpful. Please advise

## 2011-05-24 NOTE — Telephone Encounter (Signed)
Called pt twice to advise of below... A female answered phoned and there was a buzzing sound. I asked for pt and he hung up on me. Will retry later.

## 2011-05-25 NOTE — Telephone Encounter (Signed)
Pt informed

## 2011-05-26 ENCOUNTER — Encounter: Payer: Self-pay | Admitting: Internal Medicine

## 2011-05-26 NOTE — Assessment & Plan Note (Signed)
On Rx 

## 2011-05-26 NOTE — Assessment & Plan Note (Signed)
Home Health wound care RN consult  Improve nutrition  Reduce smoking

## 2011-05-26 NOTE — Assessment & Plan Note (Signed)
Walker PT

## 2011-05-26 NOTE — Assessment & Plan Note (Signed)
Reduce cig smoking to 1/2 ppd

## 2011-05-28 ENCOUNTER — Telehealth: Payer: Self-pay | Admitting: *Deleted

## 2011-05-28 NOTE — Telephone Encounter (Signed)
[facility] RN called - At home visit pt mentioned that MD suggested she take glucerna. They can help provide this but will needs RX faxed to [phone]. If ok, please provide details for RX and I will prepare and fax.

## 2011-05-28 NOTE — Telephone Encounter (Signed)
Ok Glucerna 1 po qd Thx

## 2011-05-29 MED ORDER — GLUCERNA PO LIQD: 1.0000 | Freq: Every day | ORAL | Status: DC

## 2011-05-29 NOTE — Telephone Encounter (Signed)
Faxed

## 2011-06-08 ENCOUNTER — Other Ambulatory Visit: Payer: Self-pay | Admitting: Internal Medicine

## 2011-06-12 ENCOUNTER — Other Ambulatory Visit: Payer: Self-pay | Admitting: Internal Medicine

## 2011-06-12 DIAGNOSIS — Z1231 Encounter for screening mammogram for malignant neoplasm of breast: Secondary | ICD-10-CM

## 2011-06-22 ENCOUNTER — Ambulatory Visit (HOSPITAL_COMMUNITY)
Admission: RE | Admit: 2011-06-22 | Discharge: 2011-06-22 | Disposition: A | Discharge status: Home / Self Care | Payer: Medicare Other | Source: Ambulatory Visit | Attending: Internal Medicine | Admitting: Internal Medicine

## 2011-06-22 DIAGNOSIS — Z1231 Encounter for screening mammogram for malignant neoplasm of breast: Secondary | ICD-10-CM | POA: Insufficient documentation

## 2011-06-26 ENCOUNTER — Encounter: Payer: Self-pay | Admitting: Internal Medicine

## 2011-06-26 ENCOUNTER — Other Ambulatory Visit (INDEPENDENT_AMBULATORY_CARE_PROVIDER_SITE_OTHER): Payer: Medicare Other

## 2011-06-26 ENCOUNTER — Ambulatory Visit (INDEPENDENT_AMBULATORY_CARE_PROVIDER_SITE_OTHER): Payer: Medicare Other | Admitting: Internal Medicine

## 2011-06-26 DIAGNOSIS — E538 Deficiency of other specified B group vitamins: Secondary | ICD-10-CM

## 2011-06-26 DIAGNOSIS — M545 Low back pain, unspecified: Secondary | ICD-10-CM

## 2011-06-26 DIAGNOSIS — G8929 Other chronic pain: Secondary | ICD-10-CM

## 2011-06-26 DIAGNOSIS — E119 Type 2 diabetes mellitus without complications: Secondary | ICD-10-CM

## 2011-06-26 DIAGNOSIS — R269 Unspecified abnormalities of gait and mobility: Secondary | ICD-10-CM

## 2011-06-26 DIAGNOSIS — R634 Abnormal weight loss: Secondary | ICD-10-CM

## 2011-06-26 DIAGNOSIS — L8990 Pressure ulcer of unspecified site, unspecified stage: Secondary | ICD-10-CM

## 2011-06-26 LAB — CBC WITH DIFFERENTIAL/PLATELET
Basophils Absolute: 0 10*3/uL (ref 0.0–0.1)
Basophils Relative: 0.3 % (ref 0.0–3.0)
Eosinophils Absolute: 0.1 10*3/uL (ref 0.0–0.7)
Eosinophils Relative: 1.3 % (ref 0.0–5.0)
HCT: 42.6 % (ref 36.0–46.0)
Hemoglobin: 14.1 g/dL (ref 12.0–15.0)
Lymphocytes Relative: 23.7 % (ref 12.0–46.0)
Lymphs Abs: 2 10*3/uL (ref 0.7–4.0)
MCHC: 33.2 g/dL (ref 30.0–36.0)
MCV: 94.8 fl (ref 78.0–100.0)
Monocytes Absolute: 0.4 10*3/uL (ref 0.1–1.0)
Monocytes Relative: 5.1 % (ref 3.0–12.0)
Neutro Abs: 5.9 10*3/uL (ref 1.4–7.7)
Neutrophils Relative %: 69.6 % (ref 43.0–77.0)
Platelets: 197 10*3/uL (ref 150.0–400.0)
RBC: 4.5 Mil/uL (ref 3.87–5.11)
RDW: 13.8 % (ref 11.5–14.6)
WBC: 8.4 10*3/uL (ref 4.5–10.5)

## 2011-06-26 LAB — HEMOGLOBIN A1C: Hgb A1c MFr Bld: 6.6 % — ABNORMAL HIGH (ref 4.6–6.5)

## 2011-06-26 MED ORDER — MIRTAZAPINE 15 MG PO TABS: 15.0000 mg | ORAL_TABLET | Freq: Every day | ORAL | Status: DC

## 2011-06-26 NOTE — Assessment & Plan Note (Signed)
Start Remeron 

## 2011-06-26 NOTE — Progress Notes (Signed)
  Subjective:    Patient ID: [name], female    DOB: [birth year], 60 y.o.   MRN: [id number]  HPI  The patient presents for a follow-up of  chronic hypertension, chronic dyslipidemia, type 2 diabetes controlled with medicines  F/u wt loss  Wt Readings from Last 3 Encounters:  06/26/11 107 lb (48.535 kg)  05/21/11 106 lb (48.081 kg)  02/19/11 109 lb (49.442 kg)     Review of Systems  Constitutional: Negative for chills, activity change, appetite change, fatigue and unexpected weight change.  HENT: Negative for congestion, mouth sores and sinus pressure.   Eyes: Negative for visual disturbance.  Respiratory: Negative for cough and chest tightness.   Gastrointestinal: Negative for nausea and abdominal pain.  Genitourinary: Negative for frequency, difficulty urinating and vaginal pain.  Musculoskeletal: Negative for back pain and gait problem.  Skin: Positive for wound (R ankle). Negative for pallor and rash.  Neurological: Negative for dizziness, tremors, weakness, numbness and headaches.  Psychiatric/Behavioral: Negative for confusion and sleep disturbance.       Objective:   Physical Exam  Constitutional: She appears well-developed and well-nourished. No distress.  HENT:  Head: Normocephalic.  Right Ear: External ear normal.  Left Ear: External ear normal.  Nose: Nose normal.  Mouth/Throat: Oropharynx is clear and moist.  Eyes: Conjunctivae are normal. Pupils are equal, round, and reactive to light. Right eye exhibits no discharge. Left eye exhibits no discharge.  Neck: Normal range of motion. Neck supple. No JVD present. No tracheal deviation present. No thyromegaly present.  Cardiovascular: Normal rate, regular rhythm and normal heart sounds.   Pulmonary/Chest: No stridor. No respiratory distress. She has no wheezes.  Abdominal: Soft. Bowel sounds are normal. She exhibits no distension and no mass. There is no tenderness. There is no rebound and no guarding.    Musculoskeletal: She exhibits no edema and no tenderness.  Lymphadenopathy:    She has no cervical adenopathy.  Neurological: She displays normal reflexes. No cranial nerve deficit. She exhibits normal muscle tone. Coordination normal.  Skin: No rash noted. No erythema.  Psychiatric: She has a normal mood and affect. Her behavior is normal. Judgment and thought content normal.          Assessment & Plan:

## 2011-06-26 NOTE — Patient Instructions (Signed)
Ricotta Greek yogurt and honey

## 2011-06-26 NOTE — Assessment & Plan Note (Signed)
Continue with current prescription therapy as reflected on the Med list.  

## 2011-06-26 NOTE — Assessment & Plan Note (Signed)
Cont Sinemet 

## 2011-06-26 NOTE — Assessment & Plan Note (Signed)
Eat better

## 2011-06-27 ENCOUNTER — Telehealth: Payer: Self-pay | Admitting: Internal Medicine

## 2011-06-27 LAB — COMPREHENSIVE METABOLIC PANEL
ALT: 15 U/L (ref 0–35)
AST: 17 U/L (ref 0–37)
Albumin: 4.2 g/dL (ref 3.5–5.2)
Alkaline Phosphatase: 57 U/L (ref 39–117)
BUN: 15 mg/dL (ref 6–23)
CO2: 29 mEq/L (ref 19–32)
Calcium: 10 mg/dL (ref 8.4–10.5)
Chloride: 98 mEq/L (ref 96–112)
Creatinine, Ser: 0.6 mg/dL (ref 0.4–1.2)
GFR: 119.5 mL/min (ref >60.00)
Glucose, Bld: 113 mg/dL — ABNORMAL HIGH (ref 70–99)
Potassium: 4.4 mEq/L (ref 3.5–5.1)
Sodium: 142 mEq/L (ref 135–145)
Total Bilirubin: 0.4 mg/dL (ref 0.3–1.2)
Total Protein: 6.7 g/dL (ref 6.0–8.3)

## 2011-06-27 NOTE — Telephone Encounter (Signed)
[name] , please, inform the patient: labs are OK   Please, keep  next office visit appointment.   Thank you !   

## 2011-06-27 NOTE — Telephone Encounter (Signed)
Pt informed

## 2011-07-12 LAB — FOLATE: Folate: 14.8

## 2011-07-12 LAB — VITAMIN B12: Vitamin B-12: 190 — ABNORMAL LOW (ref 211–911)

## 2011-07-13 LAB — DIFFERENTIAL
Basophils Absolute: 0
Basophils Relative: 0
Eosinophils Absolute: 0.1
Eosinophils Relative: 2
Lymphocytes Relative: 27
Lymphs Abs: 2
Monocytes Absolute: 0.5
Monocytes Relative: 7
Neutro Abs: 4.6
Neutrophils Relative %: 63

## 2011-07-13 LAB — PROTIME-INR
INR: 0.9
Prothrombin Time: 12.7

## 2011-07-13 LAB — APTT: aPTT: 33

## 2011-07-13 LAB — BASIC METABOLIC PANEL
BUN: 6
CO2: 30
Calcium: 9.5
Chloride: 97
Creatinine, Ser: 0.61
GFR calc Af Amer: >60
GFR calc non Af Amer: >60
Glucose, Bld: 114 — ABNORMAL HIGH
Potassium: 3.1 — ABNORMAL LOW
Sodium: 137

## 2011-07-13 LAB — CBC
HCT: 42.7
Hemoglobin: 14.9
MCHC: 34.9
MCV: 93
Platelets: 176
RBC: 4.59
RDW: 13.4
WBC: 7.3

## 2011-07-13 LAB — URINALYSIS, ROUTINE W REFLEX MICROSCOPIC
Bilirubin Urine: NEGATIVE
Glucose, UA: NEGATIVE
Hgb urine dipstick: NEGATIVE
Nitrite: NEGATIVE
Protein, ur: NEGATIVE
Specific Gravity, Urine: 1.017
Urobilinogen, UA: 0.2
pH: 7

## 2011-07-13 LAB — VITAMIN B12: Vitamin B-12: 245 (ref 211–911)

## 2011-07-13 LAB — FOLATE: Folate: 19.8

## 2011-07-18 ENCOUNTER — Telehealth: Payer: Self-pay | Admitting: *Deleted

## 2011-07-18 NOTE — Telephone Encounter (Signed)
Caller left vm stating pt is changing Mertazapine back to Trazodone due to side effects from Mertazapine. It was causing pt to have trouble walking and drowsy.

## 2011-07-19 NOTE — Telephone Encounter (Signed)
Correct thx

## 2011-07-19 NOTE — Telephone Encounter (Signed)
Try Mirtazipine 30 or 45 mg/day - may cause less sedation Thx

## 2011-07-19 NOTE — Telephone Encounter (Signed)
Pt informed/ [name] informed. The strength you advised below is higher than what pt was taking. Right?

## 2011-07-23 ENCOUNTER — Telehealth: Payer: Self-pay | Admitting: *Deleted

## 2011-07-23 MED ORDER — CIPROFLOXACIN HCL 250 MG PO TABS: 250.0000 mg | ORAL_TABLET | Freq: Two times a day (BID) | ORAL | Status: AC

## 2011-07-23 NOTE — Telephone Encounter (Signed)
Caller left vm on Friday stating Pt c/o UTI and states she has been given rx for this in past and is requesting Rx for this. Please advise.

## 2011-07-23 NOTE — Telephone Encounter (Signed)
OK Cipro Thx 

## 2011-07-24 NOTE — Telephone Encounter (Signed)
[facility] informed Rx sent.

## 2011-08-02 ENCOUNTER — Other Ambulatory Visit: Payer: Self-pay | Admitting: Internal Medicine

## 2011-08-20 ENCOUNTER — Telehealth: Payer: Self-pay | Admitting: *Deleted

## 2011-08-20 NOTE — Telephone Encounter (Signed)
Pt left vm stating she had BM with large/hard stools. I called pt to get more details. She c/o rectal pain but denies blood. She states she has used preparation H and the pain is better. I advised her to keep using prep H as directed, add stool softners and call us back if symptoms return/worsen.

## 2011-08-28 ENCOUNTER — Telehealth: Payer: Self-pay | Admitting: *Deleted

## 2011-08-28 NOTE — Telephone Encounter (Signed)
Ok thx.

## 2011-08-28 NOTE — Telephone Encounter (Signed)
Pt left vm stating she needs rx for diabetic shoes. Please advise

## 2011-08-30 MED ORDER — DIABETIC INSOLES MISC: Status: DC

## 2011-08-30 NOTE — Telephone Encounter (Signed)
Rx printed/pending MD sig. 

## 2011-08-31 NOTE — Telephone Encounter (Signed)
Rx mailed to pt

## 2011-09-20 ENCOUNTER — Telehealth: Payer: Self-pay | Admitting: *Deleted

## 2011-09-20 NOTE — Telephone Encounter (Signed)
Pt called stating she has been vomiting in the am X 3 wks. She states she is able to keep food down later in the day. I offered her appt with Dr. Jonny Ruiz. She declines. I transferred her to scheduler to help find available opening.

## 2011-10-05 ENCOUNTER — Other Ambulatory Visit (INDEPENDENT_AMBULATORY_CARE_PROVIDER_SITE_OTHER): Payer: Medicare Other

## 2011-10-05 ENCOUNTER — Encounter: Payer: Self-pay | Admitting: Internal Medicine

## 2011-10-05 ENCOUNTER — Other Ambulatory Visit: Payer: Self-pay | Admitting: Internal Medicine

## 2011-10-05 ENCOUNTER — Ambulatory Visit (INDEPENDENT_AMBULATORY_CARE_PROVIDER_SITE_OTHER): Payer: Medicare Other | Admitting: Internal Medicine

## 2011-10-05 DIAGNOSIS — E538 Deficiency of other specified B group vitamins: Secondary | ICD-10-CM

## 2011-10-05 DIAGNOSIS — R112 Nausea with vomiting, unspecified: Secondary | ICD-10-CM | POA: Diagnosis not present

## 2011-10-05 DIAGNOSIS — K219 Gastro-esophageal reflux disease without esophagitis: Secondary | ICD-10-CM

## 2011-10-05 DIAGNOSIS — J449 Chronic obstructive pulmonary disease, unspecified: Secondary | ICD-10-CM

## 2011-10-05 DIAGNOSIS — E119 Type 2 diabetes mellitus without complications: Secondary | ICD-10-CM

## 2011-10-05 DIAGNOSIS — Z79899 Other long term (current) drug therapy: Secondary | ICD-10-CM

## 2011-10-05 LAB — URINALYSIS, ROUTINE W REFLEX MICROSCOPIC
Bilirubin Urine: NEGATIVE
Ketones, ur: NEGATIVE
Nitrite: NEGATIVE
Specific Gravity, Urine: 1.025 (ref 1.000–1.030)
Total Protein, Urine: 30
Urine Glucose: NEGATIVE
Urobilinogen, UA: 0.2 (ref 0.0–1.0)
pH: 6 (ref 5.0–8.0)

## 2011-10-05 LAB — HEPATIC FUNCTION PANEL
ALT: 17 U/L (ref 0–35)
AST: 16 U/L (ref 0–37)
Albumin: 4.2 g/dL (ref 3.5–5.2)
Alkaline Phosphatase: 59 U/L (ref 39–117)
Bilirubin, Direct: 0.1 mg/dL (ref 0.0–0.3)
Total Bilirubin: 0.5 mg/dL (ref 0.3–1.2)
Total Protein: 6.8 g/dL (ref 6.0–8.3)

## 2011-10-05 LAB — BASIC METABOLIC PANEL
BUN: 16 mg/dL (ref 6–23)
CO2: 31 mEq/L (ref 19–32)
Calcium: 9.8 mg/dL (ref 8.4–10.5)
Chloride: 101 mEq/L (ref 96–112)
Creatinine, Ser: 0.9 mg/dL (ref 0.4–1.2)
GFR: 72.24 mL/min (ref >60.00)
Glucose, Bld: 131 mg/dL — ABNORMAL HIGH (ref 70–99)
Potassium: 4.6 mEq/L (ref 3.5–5.1)
Sodium: 143 mEq/L (ref 135–145)

## 2011-10-05 LAB — VITAMIN B12: Vitamin B-12: >1500 pg/mL — ABNORMAL HIGH (ref 211–911)

## 2011-10-05 LAB — CORTISOL: Cortisol, Plasma: 7.4 ug/dL

## 2011-10-05 LAB — H. PYLORI ANTIBODY, IGG: H Pylori IgG: NEGATIVE

## 2011-10-05 LAB — HEMOGLOBIN A1C: Hgb A1c MFr Bld: 7.5 % — ABNORMAL HIGH (ref 4.6–6.5)

## 2011-10-05 LAB — LIPASE: Lipase: 18 U/L (ref 11.0–59.0)

## 2011-10-05 MED ORDER — ZOLPIDEM TARTRATE 10 MG PO TABS: 10.0000 mg | ORAL_TABLET | Freq: Every evening | ORAL | Status: DC | PRN

## 2011-10-05 MED ORDER — VITAMIN B-12 500 MCG PO TABS: 500.0000 ug | ORAL_TABLET | Freq: Every day | ORAL | Status: DC

## 2011-10-05 MED ORDER — PROMETHAZINE HCL 12.5 MG PO TABS: 12.5000 mg | ORAL_TABLET | Freq: Four times a day (QID) | ORAL | Status: DC | PRN

## 2011-10-05 MED ORDER — RANITIDINE HCL 300 MG PO CAPS: 300.0000 mg | ORAL_CAPSULE | Freq: Every evening | ORAL | Status: DC

## 2011-10-05 MED ORDER — LORAZEPAM 2 MG PO TABS: 2.0000 mg | ORAL_TABLET | Freq: Three times a day (TID) | ORAL | Status: DC

## 2011-10-05 NOTE — Assessment & Plan Note (Signed)
Hold fosamax 

## 2011-10-05 NOTE — Assessment & Plan Note (Signed)
Continue with current prescription therapy as reflected on the Med list. Labs  

## 2011-10-05 NOTE — Assessment & Plan Note (Signed)
Re-start rx 

## 2011-10-05 NOTE — Assessment & Plan Note (Signed)
Chronic in a refractory smoker

## 2011-10-05 NOTE — Patient Instructions (Signed)
Put Fosamax on hold  Wt Readings from Last 3 Encounters:  10/05/11 110 lb (49.896 kg)  06/26/11 107 lb (48.535 kg)  05/21/11 106 lb (48.081 kg)    BP Readings from Last 3 Encounters:  10/05/11 110/70  06/26/11 90/60  05/21/11 100/68

## 2011-10-05 NOTE — Assessment & Plan Note (Signed)
Continue with current prescription therapy as reflected on the Med list.  

## 2011-10-07 ENCOUNTER — Encounter: Payer: Self-pay | Admitting: Internal Medicine

## 2011-10-07 NOTE — Progress Notes (Signed)
Patient ID: [name], female   DOB: 1951-04-14, 61 y.o.   MRN: [id number]  Subjective:    Patient ID: [name], female    DOB: 16-Sep-1951, 61 y.o.   MRN: [id number]  Emesis  Associated symptoms include arthralgias. Pertinent negatives include no abdominal pain, chills, coughing, dizziness or headaches.    The patient presents for a follow-up of  chronic hypertension, chronic dyslipidemia, type 2 diabetes controlled with medicines  F/u wt loss  Wt Readings from Last 3 Encounters:  10/05/11 110 lb (49.896 kg)  06/26/11 107 lb (48.535 kg)  05/21/11 106 lb (48.081 kg)     Review of Systems  Constitutional: Positive for fatigue. Negative for chills, activity change, appetite change and unexpected weight change.  HENT: Negative for congestion, mouth sores and sinus pressure.   Eyes: Negative for visual disturbance.  Respiratory: Negative for cough and chest tightness.   Gastrointestinal: Positive for vomiting. Negative for nausea and abdominal pain.  Genitourinary: Negative for frequency, difficulty urinating and vaginal pain.  Musculoskeletal: Positive for arthralgias and gait problem. Negative for back pain.  Skin: Positive for wound (R ankle). Negative for pallor and rash.  Neurological: Negative for dizziness, tremors, weakness, numbness and headaches.  Psychiatric/Behavioral: Positive for sleep disturbance. Negative for suicidal ideas and confusion. The patient is nervous/anxious.        Objective:   Physical Exam  Constitutional: She appears well-developed and well-nourished. No distress.  HENT:  Head: Normocephalic.  Right Ear: External ear normal.  Left Ear: External ear normal.  Nose: Nose normal.  Mouth/Throat: Oropharynx is clear and moist.  Eyes: Conjunctivae are normal. Pupils are equal, round, and reactive to light. Right eye exhibits no discharge. Left eye exhibits no discharge.  Neck: Normal range of motion. Neck supple. No JVD present. No tracheal  deviation present. No thyromegaly present.  Cardiovascular: Normal rate, regular rhythm and normal heart sounds.   Pulmonary/Chest: No stridor. No respiratory distress. She has no wheezes.  Abdominal: Soft. Bowel sounds are normal. She exhibits no distension and no mass. There is no tenderness. There is no rebound and no guarding.  Musculoskeletal: She exhibits no edema and no tenderness.  Lymphadenopathy:    She has no cervical adenopathy.  Neurological: She displays abnormal reflex. No cranial nerve deficit. She exhibits abnormal muscle tone. Coordination abnormal.       w/c  Skin: No rash noted. No erythema.       R lat malleolus with a 5 mm dry scab   Psychiatric: She has a normal mood and affect. Her behavior is normal. Judgment and thought content normal.    Lab Results  Component Value Date   WBC 8.4 06/26/2011   HGB 14.1 06/26/2011   HCT 42.6 06/26/2011   PLT 197.0 06/26/2011   GLUCOSE 131* 10/05/2011   CHOL 213* 10/10/2010   TRIG 104.0 10/10/2010   HDL 69.10 10/10/2010   LDLDIRECT 141.9 10/10/2010   ALT 17 10/05/2011   AST 16 10/05/2011   NA 143 10/05/2011   K 4.6 10/05/2011   CL 101 10/05/2011   CREATININE 0.9 10/05/2011   BUN 16 10/05/2011   CO2 31 10/05/2011   TSH 0.87 01/12/2011   INR 1.0 ratio 10/21/2009   HGBA1C 7.5* 10/05/2011         Assessment & Plan:

## 2011-10-08 ENCOUNTER — Telehealth: Payer: Self-pay | Admitting: *Deleted

## 2011-10-08 NOTE — Telephone Encounter (Signed)
Called pt to see if she is having any urinary symptoms due to abnormal UA. Spoke to pt- she states she is not having any urinary symptoms. However, she has noticed her urine has a red/pink tinge. Please advise.

## 2011-10-08 NOTE — Telephone Encounter (Signed)
We need to have her see a urologist. Is it OK for me to refer? Thx

## 2011-10-09 NOTE — Telephone Encounter (Signed)
Yes, please enter referral.  

## 2011-10-09 NOTE — Telephone Encounter (Signed)
ok 

## 2011-10-11 ENCOUNTER — Ambulatory Visit (HOSPITAL_COMMUNITY)
Admission: RE | Admit: 2011-10-11 | Discharge: 2011-10-11 | Disposition: A | Discharge status: Home / Self Care | Payer: Medicare Other | Source: Ambulatory Visit | Attending: Anesthesiology | Admitting: Anesthesiology

## 2011-10-11 ENCOUNTER — Other Ambulatory Visit (HOSPITAL_COMMUNITY): Payer: Self-pay | Admitting: Anesthesiology

## 2011-10-11 DIAGNOSIS — B351 Tinea unguium: Secondary | ICD-10-CM | POA: Diagnosis not present

## 2011-10-11 DIAGNOSIS — M25579 Pain in unspecified ankle and joints of unspecified foot: Secondary | ICD-10-CM | POA: Insufficient documentation

## 2011-10-11 DIAGNOSIS — M79609 Pain in unspecified limb: Secondary | ICD-10-CM | POA: Diagnosis not present

## 2011-10-17 DIAGNOSIS — M25519 Pain in unspecified shoulder: Secondary | ICD-10-CM | POA: Diagnosis not present

## 2011-10-17 DIAGNOSIS — M47817 Spondylosis without myelopathy or radiculopathy, lumbosacral region: Secondary | ICD-10-CM | POA: Diagnosis not present

## 2011-10-17 DIAGNOSIS — G894 Chronic pain syndrome: Secondary | ICD-10-CM | POA: Diagnosis not present

## 2011-10-24 ENCOUNTER — Ambulatory Visit (INDEPENDENT_AMBULATORY_CARE_PROVIDER_SITE_OTHER): Payer: Medicare Other | Admitting: Ophthalmology

## 2011-10-24 ENCOUNTER — Telehealth: Payer: Self-pay | Admitting: *Deleted

## 2011-10-24 DIAGNOSIS — H43819 Vitreous degeneration, unspecified eye: Secondary | ICD-10-CM | POA: Diagnosis not present

## 2011-10-24 DIAGNOSIS — E1139 Type 2 diabetes mellitus with other diabetic ophthalmic complication: Secondary | ICD-10-CM

## 2011-10-24 DIAGNOSIS — H2510 Age-related nuclear cataract, unspecified eye: Secondary | ICD-10-CM

## 2011-10-24 DIAGNOSIS — E11319 Type 2 diabetes mellitus with unspecified diabetic retinopathy without macular edema: Secondary | ICD-10-CM

## 2011-10-24 DIAGNOSIS — H3530 Unspecified macular degeneration: Secondary | ICD-10-CM | POA: Diagnosis not present

## 2011-10-24 NOTE — Telephone Encounter (Signed)
No. Cont Vit D as before Thx

## 2011-10-24 NOTE — Telephone Encounter (Signed)
Pt states PCP advised her to stop Fosamax. She wants to know if she needs to d/c Vit D also. Please advise.

## 2011-10-25 NOTE — Telephone Encounter (Signed)
Pt informed

## 2011-10-31 ENCOUNTER — Telehealth: Payer: Self-pay | Admitting: Internal Medicine

## 2011-10-31 NOTE — Telephone Encounter (Signed)
PT NEEDS REFILL ON [facility] .  [facility] [address].

## 2011-10-31 NOTE — Telephone Encounter (Signed)
OK to fill this prescription with additional refills x5 Thank you!  

## 2011-11-01 MED ORDER — ZOLPIDEM TARTRATE 10 MG PO TABS: 10.0000 mg | ORAL_TABLET | Freq: Every evening | ORAL | Status: DC | PRN

## 2011-11-08 DIAGNOSIS — M79609 Pain in unspecified limb: Secondary | ICD-10-CM | POA: Diagnosis not present

## 2011-11-08 DIAGNOSIS — B351 Tinea unguium: Secondary | ICD-10-CM | POA: Diagnosis not present

## 2011-11-14 DIAGNOSIS — R351 Nocturia: Secondary | ICD-10-CM | POA: Diagnosis not present

## 2011-11-14 DIAGNOSIS — R310 Gross hematuria: Secondary | ICD-10-CM | POA: Diagnosis not present

## 2011-11-15 DIAGNOSIS — G894 Chronic pain syndrome: Secondary | ICD-10-CM | POA: Diagnosis not present

## 2011-11-15 DIAGNOSIS — M47817 Spondylosis without myelopathy or radiculopathy, lumbosacral region: Secondary | ICD-10-CM | POA: Diagnosis not present

## 2011-11-19 DIAGNOSIS — N200 Calculus of kidney: Secondary | ICD-10-CM | POA: Diagnosis not present

## 2011-11-19 DIAGNOSIS — R310 Gross hematuria: Secondary | ICD-10-CM | POA: Diagnosis not present

## 2011-11-28 ENCOUNTER — Other Ambulatory Visit: Payer: Self-pay | Admitting: Internal Medicine

## 2011-12-04 DIAGNOSIS — R269 Unspecified abnormalities of gait and mobility: Secondary | ICD-10-CM | POA: Diagnosis not present

## 2011-12-10 DIAGNOSIS — B351 Tinea unguium: Secondary | ICD-10-CM | POA: Diagnosis not present

## 2011-12-10 DIAGNOSIS — M79609 Pain in unspecified limb: Secondary | ICD-10-CM | POA: Diagnosis not present

## 2011-12-14 DIAGNOSIS — G894 Chronic pain syndrome: Secondary | ICD-10-CM | POA: Diagnosis not present

## 2012-01-07 DIAGNOSIS — Z0279 Encounter for issue of other medical certificate: Secondary | ICD-10-CM

## 2012-01-09 ENCOUNTER — Ambulatory Visit: Payer: Medicare Other | Admitting: Internal Medicine

## 2012-01-09 DIAGNOSIS — Z0289 Encounter for other administrative examinations: Secondary | ICD-10-CM

## 2012-01-14 ENCOUNTER — Other Ambulatory Visit: Payer: Self-pay | Admitting: Internal Medicine

## 2012-01-14 DIAGNOSIS — G894 Chronic pain syndrome: Secondary | ICD-10-CM | POA: Diagnosis not present

## 2012-01-14 DIAGNOSIS — G988 Other disorders of nervous system: Secondary | ICD-10-CM | POA: Diagnosis not present

## 2012-01-14 DIAGNOSIS — M47817 Spondylosis without myelopathy or radiculopathy, lumbosacral region: Secondary | ICD-10-CM | POA: Diagnosis not present

## 2012-01-14 DIAGNOSIS — G890 Central pain syndrome: Secondary | ICD-10-CM | POA: Diagnosis not present

## 2012-02-12 DIAGNOSIS — G894 Chronic pain syndrome: Secondary | ICD-10-CM | POA: Diagnosis not present

## 2012-02-12 DIAGNOSIS — G890 Central pain syndrome: Secondary | ICD-10-CM | POA: Diagnosis not present

## 2012-02-13 ENCOUNTER — Other Ambulatory Visit: Payer: Self-pay

## 2012-02-13 MED ORDER — L-METHYLFOLATE-B6-B12 3-35-2 MG PO TABS: 1.0000 | ORAL_TABLET | Freq: Two times a day (BID) | ORAL | Status: DC

## 2012-02-20 ENCOUNTER — Other Ambulatory Visit: Payer: Self-pay | Admitting: Internal Medicine

## 2012-03-11 DIAGNOSIS — G894 Chronic pain syndrome: Secondary | ICD-10-CM | POA: Diagnosis not present

## 2012-03-11 DIAGNOSIS — M47817 Spondylosis without myelopathy or radiculopathy, lumbosacral region: Secondary | ICD-10-CM | POA: Diagnosis not present

## 2012-03-11 DIAGNOSIS — M25519 Pain in unspecified shoulder: Secondary | ICD-10-CM | POA: Diagnosis not present

## 2012-03-13 DIAGNOSIS — I739 Peripheral vascular disease, unspecified: Secondary | ICD-10-CM | POA: Diagnosis not present

## 2012-03-13 DIAGNOSIS — E1159 Type 2 diabetes mellitus with other circulatory complications: Secondary | ICD-10-CM | POA: Diagnosis not present

## 2012-03-13 DIAGNOSIS — L608 Other nail disorders: Secondary | ICD-10-CM | POA: Diagnosis not present

## 2012-03-26 ENCOUNTER — Other Ambulatory Visit: Payer: Self-pay | Admitting: Internal Medicine

## 2012-03-31 ENCOUNTER — Encounter: Payer: Self-pay | Admitting: Internal Medicine

## 2012-03-31 ENCOUNTER — Ambulatory Visit (INDEPENDENT_AMBULATORY_CARE_PROVIDER_SITE_OTHER): Payer: Medicare Other | Admitting: Internal Medicine

## 2012-03-31 VITALS — BP 120/70 | HR 96 | Temp 98.3°F | Resp 16 | Wt 122.0 lb

## 2012-03-31 DIAGNOSIS — L97309 Non-pressure chronic ulcer of unspecified ankle with unspecified severity: Secondary | ICD-10-CM

## 2012-03-31 DIAGNOSIS — R634 Abnormal weight loss: Secondary | ICD-10-CM | POA: Diagnosis not present

## 2012-03-31 MED ORDER — TRIAMCINOLONE ACETONIDE 0.5 % EX OINT: TOPICAL_OINTMENT | Freq: Two times a day (BID) | CUTANEOUS | Status: DC

## 2012-03-31 MED ORDER — TRIAMCINOLONE ACETONIDE 0.5 % EX OINT: TOPICAL_OINTMENT | Freq: Two times a day (BID) | CUTANEOUS | Status: AC

## 2012-03-31 NOTE — Patient Instructions (Addendum)
Use Triamcinolone ointment twice a day; band aid Wash with soap, scrub the hard center off gently with a paper towel over a period of few days; when the center is soft and pink - stop scrubbing it, just wash and pat dry and cont with ointment and band aid dressings

## 2012-04-02 ENCOUNTER — Encounter: Payer: Self-pay | Admitting: Internal Medicine

## 2012-04-02 DIAGNOSIS — L97309 Non-pressure chronic ulcer of unspecified ankle with unspecified severity: Secondary | ICD-10-CM | POA: Insufficient documentation

## 2012-04-02 NOTE — Assessment & Plan Note (Signed)
I think it is related to her smoking more at the time (2012) 7/13 - better

## 2012-04-02 NOTE — Progress Notes (Signed)
   Subjective:    Patient ID: [name], female    DOB: [birth year], 61 y.o.   MRN: [id number]  HPI C/o R outer ankle sore x 2 mo  The patient presents for a follow-up of  chronic hypertension, chronic dyslipidemia, type 2 diabetes controlled with medicines  F/u wt loss  Wt Readings from Last 3 Encounters:  03/31/12 122 lb (55.339 kg)  10/05/11 110 lb (49.896 kg)  06/26/11 107 lb (48.535 kg)     Review of Systems  Constitutional: Positive for fatigue. Negative for activity change, appetite change and unexpected weight change.  HENT: Negative for congestion, mouth sores and sinus pressure.   Eyes: Negative for visual disturbance.  Respiratory: Negative for chest tightness.   Gastrointestinal: Negative for nausea.  Genitourinary: Negative for frequency, difficulty urinating and vaginal pain.  Musculoskeletal: Positive for gait problem. Negative for back pain.  Skin: Positive for wound (R ankle). Negative for pallor and rash.  Neurological: Negative for tremors, weakness and numbness.  Psychiatric/Behavioral: Positive for disturbed wake/sleep cycle. Negative for suicidal ideas and confusion. The patient is nervous/anxious.        Objective:   Physical Exam  Constitutional: She appears well-developed and well-nourished. No distress.  HENT:  Head: Normocephalic.  Right Ear: External ear normal.  Left Ear: External ear normal.  Nose: Nose normal.  Mouth/Throat: Oropharynx is clear and moist.  Eyes: Conjunctivae are normal. Pupils are equal, round, and reactive to light. Right eye exhibits no discharge. Left eye exhibits no discharge.  Neck: Normal range of motion. Neck supple. No JVD present. No tracheal deviation present. No thyromegaly present.  Cardiovascular: Normal rate, regular rhythm and normal heart sounds.   Pulmonary/Chest: No stridor. No respiratory distress. She has no wheezes.  Abdominal: Soft. Bowel sounds are normal. She exhibits no distension and no mass.  There is no tenderness. There is no rebound and no guarding.  Musculoskeletal: She exhibits no edema and no tenderness.  Lymphadenopathy:    She has no cervical adenopathy.  Neurological: She displays abnormal reflex. No cranial nerve deficit. She exhibits abnormal muscle tone. Coordination abnormal.       w/c  Skin: No rash noted. No erythema.       R lat malleolus with a 3 mm dry scab   Psychiatric: She has a normal mood and affect. Her behavior is normal. Judgment and thought content normal.  R popl art pulse is good Ankle arteries pulse is diminished some  Lab Results  Component Value Date   WBC 8.4 06/26/2011   HGB 14.1 06/26/2011   HCT 42.6 06/26/2011   PLT 197.0 06/26/2011   GLUCOSE 131* 10/05/2011   CHOL 213* 10/10/2010   TRIG 104.0 10/10/2010   HDL 69.10 10/10/2010   LDLDIRECT 141.9 10/10/2010   ALT 17 10/05/2011   AST 16 10/05/2011   NA 143 10/05/2011   K 4.6 10/05/2011   CL 101 10/05/2011   CREATININE 0.9 10/05/2011   BUN 16 10/05/2011   CO2 31 10/05/2011   TSH 0.87 01/12/2011   INR 1.0 ratio 10/21/2009   HGBA1C 7.5* 10/05/2011         Assessment & Plan:

## 2012-04-02 NOTE — Assessment & Plan Note (Signed)
7/13 R 3 mm - trophic Use Triamcinolone ointment twice a day; band aid Wash with soap, scrub the hard center off gently with a paper towel over a period of few days; when the center is soft and pink - stop scrubbing it, just wash and pat dry and cont with ointment and band aid dressings

## 2012-04-07 ENCOUNTER — Telehealth: Payer: Self-pay | Admitting: *Deleted

## 2012-04-07 MED ORDER — LORAZEPAM 2 MG PO TABS: 2.0000 mg | ORAL_TABLET | Freq: Three times a day (TID) | ORAL | Status: DC

## 2012-04-07 NOTE — Telephone Encounter (Signed)
OK to fill this prescription with additional refills x3 Thank you!  

## 2012-04-07 NOTE — Telephone Encounter (Signed)
Rf req for Lorazepam 2 mg 1 po tid. Ok to RF?

## 2012-04-07 NOTE — Telephone Encounter (Signed)
Done

## 2012-04-08 DIAGNOSIS — M542 Cervicalgia: Secondary | ICD-10-CM | POA: Diagnosis not present

## 2012-04-08 DIAGNOSIS — G890 Central pain syndrome: Secondary | ICD-10-CM | POA: Diagnosis not present

## 2012-04-08 DIAGNOSIS — M25519 Pain in unspecified shoulder: Secondary | ICD-10-CM | POA: Diagnosis not present

## 2012-04-08 DIAGNOSIS — G894 Chronic pain syndrome: Secondary | ICD-10-CM | POA: Diagnosis not present

## 2012-04-16 ENCOUNTER — Other Ambulatory Visit: Payer: Self-pay | Admitting: Internal Medicine

## 2012-04-22 ENCOUNTER — Telehealth: Payer: Self-pay | Admitting: *Deleted

## 2012-04-22 MED ORDER — ZOLPIDEM TARTRATE 10 MG PO TABS: 10.0000 mg | ORAL_TABLET | Freq: Every evening | ORAL | Status: DC | PRN

## 2012-04-22 NOTE — Telephone Encounter (Signed)
Rf req for Zolpidem 10 mg 1 po qhs. Last filled 03/20/12  Ok to Rf?

## 2012-04-22 NOTE — Telephone Encounter (Signed)
Done

## 2012-04-22 NOTE — Telephone Encounter (Signed)
OK to fill this prescription with additional refills x3 Thank you!  

## 2012-04-30 ENCOUNTER — Other Ambulatory Visit: Payer: Self-pay | Admitting: *Deleted

## 2012-04-30 MED ORDER — L-METHYLFOLATE-B6-B12 3-35-2 MG PO TABS: 1.0000 | ORAL_TABLET | Freq: Two times a day (BID) | ORAL | Status: DC

## 2012-05-07 DIAGNOSIS — G894 Chronic pain syndrome: Secondary | ICD-10-CM | POA: Diagnosis not present

## 2012-05-07 DIAGNOSIS — M25519 Pain in unspecified shoulder: Secondary | ICD-10-CM | POA: Diagnosis not present

## 2012-05-13 ENCOUNTER — Telehealth: Payer: Self-pay

## 2012-05-13 NOTE — Telephone Encounter (Signed)
Pt called stating that Metanx is too expensive - $160/month. Pt is requesting a cheaper alternative, please advise.

## 2012-05-14 NOTE — Telephone Encounter (Signed)
Please check with the pharmacy - What do they have? Thx

## 2012-05-15 ENCOUNTER — Other Ambulatory Visit: Payer: Self-pay | Admitting: Internal Medicine

## 2012-05-15 NOTE — Telephone Encounter (Signed)
Per pharmacist Michele Mcalpine) this category of medication is not covered by pt's insurance - no covered alternatives. Please advise.

## 2012-05-15 NOTE — Telephone Encounter (Signed)
Take Vit B complex 1 a day instead Thx

## 2012-05-16 NOTE — Telephone Encounter (Signed)
Pt advised via VM 

## 2012-05-19 DIAGNOSIS — R310 Gross hematuria: Secondary | ICD-10-CM | POA: Diagnosis not present

## 2012-05-19 DIAGNOSIS — N209 Urinary calculus, unspecified: Secondary | ICD-10-CM | POA: Diagnosis not present

## 2012-05-21 ENCOUNTER — Other Ambulatory Visit: Payer: Self-pay | Admitting: Internal Medicine

## 2012-05-22 DIAGNOSIS — L608 Other nail disorders: Secondary | ICD-10-CM | POA: Diagnosis not present

## 2012-05-22 DIAGNOSIS — E1159 Type 2 diabetes mellitus with other circulatory complications: Secondary | ICD-10-CM | POA: Diagnosis not present

## 2012-05-22 DIAGNOSIS — I739 Peripheral vascular disease, unspecified: Secondary | ICD-10-CM | POA: Diagnosis not present

## 2012-05-22 DIAGNOSIS — Z0279 Encounter for issue of other medical certificate: Secondary | ICD-10-CM

## 2012-06-04 DIAGNOSIS — G890 Central pain syndrome: Secondary | ICD-10-CM | POA: Diagnosis not present

## 2012-06-04 DIAGNOSIS — M47817 Spondylosis without myelopathy or radiculopathy, lumbosacral region: Secondary | ICD-10-CM | POA: Diagnosis not present

## 2012-06-04 DIAGNOSIS — M542 Cervicalgia: Secondary | ICD-10-CM | POA: Diagnosis not present

## 2012-06-05 DIAGNOSIS — E119 Type 2 diabetes mellitus without complications: Secondary | ICD-10-CM | POA: Diagnosis not present

## 2012-06-05 DIAGNOSIS — G931 Anoxic brain damage, not elsewhere classified: Secondary | ICD-10-CM | POA: Diagnosis not present

## 2012-06-05 DIAGNOSIS — I6350 Cerebral infarction due to unspecified occlusion or stenosis of unspecified cerebral artery: Secondary | ICD-10-CM | POA: Diagnosis not present

## 2012-06-05 DIAGNOSIS — R269 Unspecified abnormalities of gait and mobility: Secondary | ICD-10-CM | POA: Diagnosis not present

## 2012-06-17 ENCOUNTER — Other Ambulatory Visit: Payer: Self-pay | Admitting: Internal Medicine

## 2012-06-24 ENCOUNTER — Other Ambulatory Visit: Payer: Self-pay

## 2012-06-24 MED ORDER — METFORMIN HCL ER 500 MG PO TB24: ORAL_TABLET | ORAL | Status: DC

## 2012-07-02 DIAGNOSIS — G894 Chronic pain syndrome: Secondary | ICD-10-CM | POA: Diagnosis not present

## 2012-07-02 DIAGNOSIS — M47817 Spondylosis without myelopathy or radiculopathy, lumbosacral region: Secondary | ICD-10-CM | POA: Diagnosis not present

## 2012-07-14 ENCOUNTER — Other Ambulatory Visit: Payer: Self-pay | Admitting: Internal Medicine

## 2012-07-26 ENCOUNTER — Other Ambulatory Visit: Payer: Self-pay | Admitting: Internal Medicine

## 2012-07-28 NOTE — Telephone Encounter (Signed)
Last rx'd 04/07/2012 #90 with 3 refills-please advise.

## 2012-08-01 NOTE — Telephone Encounter (Signed)
Verbal order to refill per Dr. Posey Rea, rx phoned into [facility].  Pt's husband informed.

## 2012-08-12 DIAGNOSIS — G988 Other disorders of nervous system: Secondary | ICD-10-CM | POA: Diagnosis not present

## 2012-08-12 DIAGNOSIS — M47817 Spondylosis without myelopathy or radiculopathy, lumbosacral region: Secondary | ICD-10-CM | POA: Diagnosis not present

## 2012-08-12 DIAGNOSIS — G890 Central pain syndrome: Secondary | ICD-10-CM | POA: Diagnosis not present

## 2012-08-14 DIAGNOSIS — I739 Peripheral vascular disease, unspecified: Secondary | ICD-10-CM | POA: Diagnosis not present

## 2012-08-14 DIAGNOSIS — L608 Other nail disorders: Secondary | ICD-10-CM | POA: Diagnosis not present

## 2012-08-14 DIAGNOSIS — E1159 Type 2 diabetes mellitus with other circulatory complications: Secondary | ICD-10-CM | POA: Diagnosis not present

## 2012-08-15 ENCOUNTER — Other Ambulatory Visit: Payer: Self-pay | Admitting: Internal Medicine

## 2012-08-18 NOTE — Telephone Encounter (Signed)
Ok to RF? 

## 2012-08-23 DIAGNOSIS — Z23 Encounter for immunization: Secondary | ICD-10-CM | POA: Diagnosis not present

## 2012-09-02 DIAGNOSIS — G894 Chronic pain syndrome: Secondary | ICD-10-CM | POA: Diagnosis not present

## 2012-09-02 DIAGNOSIS — M47817 Spondylosis without myelopathy or radiculopathy, lumbosacral region: Secondary | ICD-10-CM | POA: Diagnosis not present

## 2012-09-02 DIAGNOSIS — G890 Central pain syndrome: Secondary | ICD-10-CM | POA: Diagnosis not present

## 2012-09-04 ENCOUNTER — Other Ambulatory Visit: Payer: Self-pay | Admitting: *Deleted

## 2012-09-04 MED ORDER — L-METHYLFOLATE-B6-B12 3-35-2 MG PO TABS: 1.0000 | ORAL_TABLET | Freq: Two times a day (BID) | ORAL | Status: DC

## 2012-09-17 ENCOUNTER — Other Ambulatory Visit: Payer: Self-pay | Admitting: Internal Medicine

## 2012-09-30 DIAGNOSIS — G890 Central pain syndrome: Secondary | ICD-10-CM | POA: Diagnosis not present

## 2012-09-30 DIAGNOSIS — G894 Chronic pain syndrome: Secondary | ICD-10-CM | POA: Diagnosis not present

## 2012-10-06 DIAGNOSIS — G931 Anoxic brain damage, not elsewhere classified: Secondary | ICD-10-CM | POA: Diagnosis not present

## 2012-10-06 DIAGNOSIS — R269 Unspecified abnormalities of gait and mobility: Secondary | ICD-10-CM | POA: Diagnosis not present

## 2012-10-16 ENCOUNTER — Other Ambulatory Visit: Payer: Self-pay | Admitting: Internal Medicine

## 2012-10-24 ENCOUNTER — Ambulatory Visit (INDEPENDENT_AMBULATORY_CARE_PROVIDER_SITE_OTHER): Payer: Medicare Other | Admitting: Ophthalmology

## 2012-10-29 DIAGNOSIS — M25519 Pain in unspecified shoulder: Secondary | ICD-10-CM | POA: Diagnosis not present

## 2012-10-29 DIAGNOSIS — G890 Central pain syndrome: Secondary | ICD-10-CM | POA: Diagnosis not present

## 2012-10-29 DIAGNOSIS — G894 Chronic pain syndrome: Secondary | ICD-10-CM | POA: Diagnosis not present

## 2012-10-29 DIAGNOSIS — M47817 Spondylosis without myelopathy or radiculopathy, lumbosacral region: Secondary | ICD-10-CM | POA: Diagnosis not present

## 2012-11-10 DIAGNOSIS — E1159 Type 2 diabetes mellitus with other circulatory complications: Secondary | ICD-10-CM | POA: Diagnosis not present

## 2012-11-10 DIAGNOSIS — L608 Other nail disorders: Secondary | ICD-10-CM | POA: Diagnosis not present

## 2012-11-10 DIAGNOSIS — I739 Peripheral vascular disease, unspecified: Secondary | ICD-10-CM | POA: Diagnosis not present

## 2012-11-11 ENCOUNTER — Other Ambulatory Visit: Payer: Self-pay | Admitting: Internal Medicine

## 2012-11-16 ENCOUNTER — Other Ambulatory Visit: Payer: Self-pay | Admitting: Internal Medicine

## 2012-11-25 DIAGNOSIS — M25519 Pain in unspecified shoulder: Secondary | ICD-10-CM | POA: Diagnosis not present

## 2012-11-25 DIAGNOSIS — G894 Chronic pain syndrome: Secondary | ICD-10-CM | POA: Diagnosis not present

## 2012-11-25 DIAGNOSIS — M25579 Pain in unspecified ankle and joints of unspecified foot: Secondary | ICD-10-CM | POA: Diagnosis not present

## 2012-11-25 DIAGNOSIS — M47817 Spondylosis without myelopathy or radiculopathy, lumbosacral region: Secondary | ICD-10-CM | POA: Diagnosis not present

## 2012-11-26 ENCOUNTER — Ambulatory Visit (INDEPENDENT_AMBULATORY_CARE_PROVIDER_SITE_OTHER): Payer: Medicare Other | Admitting: Ophthalmology

## 2012-11-26 DIAGNOSIS — H43819 Vitreous degeneration, unspecified eye: Secondary | ICD-10-CM

## 2012-11-26 DIAGNOSIS — E1139 Type 2 diabetes mellitus with other diabetic ophthalmic complication: Secondary | ICD-10-CM

## 2012-11-26 DIAGNOSIS — H3530 Unspecified macular degeneration: Secondary | ICD-10-CM

## 2012-11-26 DIAGNOSIS — E11319 Type 2 diabetes mellitus with unspecified diabetic retinopathy without macular edema: Secondary | ICD-10-CM | POA: Diagnosis not present

## 2012-11-26 DIAGNOSIS — H2510 Age-related nuclear cataract, unspecified eye: Secondary | ICD-10-CM

## 2012-12-15 ENCOUNTER — Other Ambulatory Visit: Payer: Self-pay | Admitting: Internal Medicine

## 2012-12-16 ENCOUNTER — Other Ambulatory Visit: Payer: Self-pay | Admitting: Internal Medicine

## 2012-12-22 ENCOUNTER — Other Ambulatory Visit: Payer: Self-pay | Admitting: Internal Medicine

## 2012-12-24 ENCOUNTER — Ambulatory Visit (INDEPENDENT_AMBULATORY_CARE_PROVIDER_SITE_OTHER): Payer: Medicare Other

## 2012-12-24 ENCOUNTER — Ambulatory Visit (INDEPENDENT_AMBULATORY_CARE_PROVIDER_SITE_OTHER): Payer: Medicare Other | Admitting: Internal Medicine

## 2012-12-24 ENCOUNTER — Encounter: Payer: Self-pay | Admitting: Internal Medicine

## 2012-12-24 VITALS — BP 140/76 | HR 80 | Temp 98.1°F | Resp 16 | Ht 65.0 in | Wt 125.0 lb

## 2012-12-24 DIAGNOSIS — F411 Generalized anxiety disorder: Secondary | ICD-10-CM

## 2012-12-24 DIAGNOSIS — Z0000 Encounter for general adult medical examination without abnormal findings: Secondary | ICD-10-CM

## 2012-12-24 DIAGNOSIS — M545 Low back pain, unspecified: Secondary | ICD-10-CM

## 2012-12-24 DIAGNOSIS — E119 Type 2 diabetes mellitus without complications: Secondary | ICD-10-CM

## 2012-12-24 DIAGNOSIS — E538 Deficiency of other specified B group vitamins: Secondary | ICD-10-CM

## 2012-12-24 DIAGNOSIS — R634 Abnormal weight loss: Secondary | ICD-10-CM

## 2012-12-24 DIAGNOSIS — F329 Major depressive disorder, single episode, unspecified: Secondary | ICD-10-CM

## 2012-12-24 DIAGNOSIS — E559 Vitamin D deficiency, unspecified: Secondary | ICD-10-CM | POA: Diagnosis not present

## 2012-12-24 DIAGNOSIS — R269 Unspecified abnormalities of gait and mobility: Secondary | ICD-10-CM | POA: Diagnosis not present

## 2012-12-24 DIAGNOSIS — G4700 Insomnia, unspecified: Secondary | ICD-10-CM

## 2012-12-24 DIAGNOSIS — F3289 Other specified depressive episodes: Secondary | ICD-10-CM

## 2012-12-24 LAB — HEPATIC FUNCTION PANEL
ALT: 12 U/L (ref 0–35)
AST: 18 U/L (ref 0–37)
Albumin: 4.3 g/dL (ref 3.5–5.2)
Alkaline Phosphatase: 63 U/L (ref 39–117)
Bilirubin, Direct: 0.1 mg/dL (ref 0.0–0.3)
Total Bilirubin: 0.5 mg/dL (ref 0.3–1.2)
Total Protein: 7 g/dL (ref 6.0–8.3)

## 2012-12-24 LAB — BASIC METABOLIC PANEL
BUN: 9 mg/dL (ref 6–23)
CO2: 31 mEq/L (ref 19–32)
Calcium: 9.8 mg/dL (ref 8.4–10.5)
Chloride: 98 mEq/L (ref 96–112)
Creatinine, Ser: 0.6 mg/dL (ref 0.4–1.2)
GFR: 111.84 mL/min (ref >60.00)
Glucose, Bld: 107 mg/dL — ABNORMAL HIGH (ref 70–99)
Potassium: 4.8 mEq/L (ref 3.5–5.1)
Sodium: 138 mEq/L (ref 135–145)

## 2012-12-24 LAB — LIPID PANEL
Cholesterol: 240 mg/dL — ABNORMAL HIGH (ref 0–200)
HDL: 104.3 mg/dL (ref >39.00)
Total CHOL/HDL Ratio: 2
Triglycerides: 123 mg/dL (ref 0.0–149.0)
VLDL: 24.6 mg/dL (ref 0.0–40.0)

## 2012-12-24 LAB — URINALYSIS
Bilirubin Urine: NEGATIVE
Hgb urine dipstick: NEGATIVE
Ketones, ur: NEGATIVE
Leukocytes, UA: NEGATIVE
Nitrite: NEGATIVE
Specific Gravity, Urine: 1.01 (ref 1.000–1.030)
Total Protein, Urine: NEGATIVE
Urine Glucose: NEGATIVE
Urobilinogen, UA: 0.2 (ref 0.0–1.0)
pH: 6 (ref 5.0–8.0)

## 2012-12-24 LAB — CBC WITH DIFFERENTIAL/PLATELET
Basophils Absolute: 0 10*3/uL (ref 0.0–0.1)
Basophils Relative: 0.4 % (ref 0.0–3.0)
Eosinophils Absolute: 0.2 10*3/uL (ref 0.0–0.7)
Eosinophils Relative: 2.2 % (ref 0.0–5.0)
HCT: 45.9 % (ref 36.0–46.0)
Hemoglobin: 15.4 g/dL — ABNORMAL HIGH (ref 12.0–15.0)
Lymphocytes Relative: 31.4 % (ref 12.0–46.0)
Lymphs Abs: 2.4 10*3/uL (ref 0.7–4.0)
MCHC: 33.6 g/dL (ref 30.0–36.0)
MCV: 94.2 fl (ref 78.0–100.0)
Monocytes Absolute: 0.4 10*3/uL (ref 0.1–1.0)
Monocytes Relative: 5.8 % (ref 3.0–12.0)
Neutro Abs: 4.6 10*3/uL (ref 1.4–7.7)
Neutrophils Relative %: 60.2 % (ref 43.0–77.0)
Platelets: 220 10*3/uL (ref 150.0–400.0)
RBC: 4.87 Mil/uL (ref 3.87–5.11)
RDW: 14.9 % — ABNORMAL HIGH (ref 11.5–14.6)
WBC: 7.7 10*3/uL (ref 4.5–10.5)

## 2012-12-24 LAB — SEDIMENTATION RATE: Sed Rate: 2 mm/hr (ref 0–22)

## 2012-12-24 LAB — HEMOGLOBIN A1C: Hgb A1c MFr Bld: 6.8 % — ABNORMAL HIGH (ref 4.6–6.5)

## 2012-12-24 MED ORDER — VITAMIN D 50 MCG (2000 UT) PO TABS: 2000.0000 [IU] | ORAL_TABLET | Freq: Every day | ORAL | Status: DC

## 2012-12-24 MED ORDER — ZOLPIDEM TARTRATE 10 MG PO TABS: 10.0000 mg | ORAL_TABLET | Freq: Every evening | ORAL | Status: DC | PRN

## 2012-12-24 MED ORDER — L-METHYLFOLATE-B6-B12 3-35-2 MG PO TABS: 1.0000 | ORAL_TABLET | Freq: Two times a day (BID) | ORAL | Status: DC

## 2012-12-24 NOTE — Assessment & Plan Note (Signed)
Continue with current prescription therapy as reflected on the Med list.  

## 2012-12-24 NOTE — Telephone Encounter (Signed)
Pharmacy has called to check the status of refill request

## 2012-12-24 NOTE — Assessment & Plan Note (Signed)
Resolved

## 2012-12-24 NOTE — Progress Notes (Signed)
   Subjective:    HPI The patient is here for a wellness exam. The patient has been doing well overall without major physical or psychological issues going on lately. C/o R outer ankle sore x 6 mo - hitting it w/a walker a lot. Smoking 1 ppd  The patient presents for a follow-up of  chronic hypertension, chronic dyslipidemia, type 2 diabetes controlled with medicines  F/u wt loss  better  Wt Readings from Last 3 Encounters:  12/24/12 125 lb (56.7 kg)  03/31/12 122 lb (55.339 kg)  10/05/11 110 lb (49.896 kg)   BP Readings from Last 3 Encounters:  12/24/12 140/76  03/31/12 120/70  10/05/11 110/70     Review of Systems  Constitutional: Positive for fatigue. Negative for activity change, appetite change and unexpected weight change.  HENT: Negative for congestion, mouth sores and sinus pressure.   Eyes: Negative for visual disturbance.  Respiratory: Negative for chest tightness.   Gastrointestinal: Negative for nausea.  Genitourinary: Negative for frequency, difficulty urinating and vaginal pain.  Musculoskeletal: Positive for gait problem. Negative for back pain.  Skin: Positive for wound (R ankle). Negative for pallor and rash.  Neurological: Negative for tremors, weakness and numbness.  Psychiatric/Behavioral: Positive for sleep disturbance. Negative for suicidal ideas and confusion. The patient is nervous/anxious.        Objective:   Physical Exam  Constitutional: She appears well-developed and well-nourished. No distress.  HENT:  Head: Normocephalic.  Right Ear: External ear normal.  Left Ear: External ear normal.  Nose: Nose normal.  Mouth/Throat: Oropharynx is clear and moist.  Eyes: Conjunctivae are normal. Pupils are equal, round, and reactive to light. Right eye exhibits no discharge. Left eye exhibits no discharge.  Neck: Normal range of motion. Neck supple. No JVD present. No tracheal deviation present. No thyromegaly present.  Cardiovascular: Normal rate,  regular rhythm and normal heart sounds.   Pulmonary/Chest: No stridor. No respiratory distress. She has no wheezes.  Abdominal: Soft. Bowel sounds are normal. She exhibits no distension and no mass. There is no tenderness. There is no rebound and no guarding.  Musculoskeletal: She exhibits no edema and no tenderness.  Lymphadenopathy:    She has no cervical adenopathy.  Neurological: She displays abnormal reflex. No cranial nerve deficit. She exhibits abnormal muscle tone. Coordination abnormal.  w/c  Skin: No rash noted. No erythema.  R lat malleolus with a 3 mm dry scab   Psychiatric: She has a normal mood and affect. Her behavior is normal. Judgment and thought content normal.  R popl art pulse is good Ankle arteries pulse is diminished some  Lab Results  Component Value Date   WBC 8.4 06/26/2011   HGB 14.1 06/26/2011   HCT 42.6 06/26/2011   PLT 197.0 06/26/2011   GLUCOSE 131* 10/05/2011   CHOL 213* 10/10/2010   TRIG 104.0 10/10/2010   HDL 69.10 10/10/2010   LDLDIRECT 141.9 10/10/2010   ALT 17 10/05/2011   AST 16 10/05/2011   NA 143 10/05/2011   K 4.6 10/05/2011   CL 101 10/05/2011   CREATININE 0.9 10/05/2011   BUN 16 10/05/2011   CO2 31 10/05/2011   TSH 0.87 01/12/2011   INR 1.0 ratio 10/21/2009   HGBA1C 7.5* 10/05/2011         Assessment & Plan:

## 2012-12-25 ENCOUNTER — Telehealth: Payer: Self-pay | Admitting: Internal Medicine

## 2012-12-25 DIAGNOSIS — G894 Chronic pain syndrome: Secondary | ICD-10-CM | POA: Diagnosis not present

## 2012-12-25 LAB — TSH: TSH: 2.27 u[IU]/mL (ref 0.35–5.50)

## 2012-12-25 LAB — VITAMIN B12: Vitamin B-12: >1500 pg/mL — ABNORMAL HIGH (ref 211–911)

## 2012-12-25 LAB — LDL CHOLESTEROL, DIRECT: Direct LDL: 114.8 mg/dL

## 2012-12-25 LAB — VITAMIN D 25 HYDROXY (VIT D DEFICIENCY, FRACTURES): Vit D, 25-Hydroxy: 11 ng/mL — ABNORMAL LOW (ref 30–89)

## 2012-12-25 MED ORDER — ERGOCALCIFEROL 1.25 MG (50000 UT) PO CAPS: 50000.0000 [IU] | ORAL_CAPSULE | ORAL | Status: DC

## 2012-12-25 NOTE — Telephone Encounter (Signed)
Pt informed

## 2012-12-25 NOTE — Telephone Encounter (Signed)
[name], please, inform patient that her vit D is low. Take Rx Thx

## 2013-01-09 ENCOUNTER — Other Ambulatory Visit: Payer: Self-pay | Admitting: *Deleted

## 2013-01-09 MED ORDER — L-METHYLFOLATE-B6-B12 3-35-2 MG PO TABS: 1.0000 | ORAL_TABLET | Freq: Two times a day (BID) | ORAL | Status: DC

## 2013-01-13 ENCOUNTER — Other Ambulatory Visit: Payer: Self-pay | Admitting: Internal Medicine

## 2013-01-19 DIAGNOSIS — E1159 Type 2 diabetes mellitus with other circulatory complications: Secondary | ICD-10-CM | POA: Diagnosis not present

## 2013-01-19 DIAGNOSIS — L608 Other nail disorders: Secondary | ICD-10-CM | POA: Diagnosis not present

## 2013-01-19 DIAGNOSIS — I739 Peripheral vascular disease, unspecified: Secondary | ICD-10-CM | POA: Diagnosis not present

## 2013-01-19 DIAGNOSIS — L84 Corns and callosities: Secondary | ICD-10-CM | POA: Diagnosis not present

## 2013-01-22 DIAGNOSIS — Z79899 Other long term (current) drug therapy: Secondary | ICD-10-CM | POA: Diagnosis not present

## 2013-01-22 DIAGNOSIS — M47817 Spondylosis without myelopathy or radiculopathy, lumbosacral region: Secondary | ICD-10-CM | POA: Diagnosis not present

## 2013-02-14 ENCOUNTER — Other Ambulatory Visit: Payer: Self-pay | Admitting: Internal Medicine

## 2013-02-20 DIAGNOSIS — M25579 Pain in unspecified ankle and joints of unspecified foot: Secondary | ICD-10-CM | POA: Diagnosis not present

## 2013-02-20 DIAGNOSIS — G894 Chronic pain syndrome: Secondary | ICD-10-CM | POA: Diagnosis not present

## 2013-02-20 DIAGNOSIS — M47817 Spondylosis without myelopathy or radiculopathy, lumbosacral region: Secondary | ICD-10-CM | POA: Diagnosis not present

## 2013-02-25 ENCOUNTER — Other Ambulatory Visit: Payer: Self-pay | Admitting: Internal Medicine

## 2013-03-07 ENCOUNTER — Other Ambulatory Visit: Payer: Self-pay | Admitting: Internal Medicine

## 2013-03-10 NOTE — Telephone Encounter (Signed)
Pt called to check on med req, pt stated that she is out of this med. Please advise.

## 2013-03-11 ENCOUNTER — Other Ambulatory Visit: Payer: Self-pay | Admitting: Internal Medicine

## 2013-03-11 NOTE — Telephone Encounter (Signed)
Rf phoned in from previous Rx request.

## 2013-03-17 ENCOUNTER — Other Ambulatory Visit: Payer: Self-pay | Admitting: Internal Medicine

## 2013-03-17 NOTE — Telephone Encounter (Signed)
Please advise refill? 

## 2013-03-20 DIAGNOSIS — G894 Chronic pain syndrome: Secondary | ICD-10-CM | POA: Diagnosis not present

## 2013-03-20 DIAGNOSIS — M47817 Spondylosis without myelopathy or radiculopathy, lumbosacral region: Secondary | ICD-10-CM | POA: Diagnosis not present

## 2013-03-20 DIAGNOSIS — G890 Central pain syndrome: Secondary | ICD-10-CM | POA: Diagnosis not present

## 2013-03-20 DIAGNOSIS — M542 Cervicalgia: Secondary | ICD-10-CM | POA: Diagnosis not present

## 2013-03-26 ENCOUNTER — Other Ambulatory Visit: Payer: Self-pay

## 2013-03-26 MED ORDER — CARBIDOPA 25 MG PO TABS: 25.0000 mg | ORAL_TABLET | Freq: Every day | ORAL | Status: DC

## 2013-03-26 MED ORDER — CARBIDOPA-LEVODOPA 25-100 MG PO TABS: 1.0000 | ORAL_TABLET | Freq: Three times a day (TID) | ORAL | Status: DC

## 2013-03-26 NOTE — Telephone Encounter (Signed)
Former [facility] patient.  Has not been assigned new provider.  Auth refills via [facility]

## 2013-03-31 DIAGNOSIS — E1159 Type 2 diabetes mellitus with other circulatory complications: Secondary | ICD-10-CM | POA: Diagnosis not present

## 2013-03-31 DIAGNOSIS — I739 Peripheral vascular disease, unspecified: Secondary | ICD-10-CM | POA: Diagnosis not present

## 2013-03-31 DIAGNOSIS — L608 Other nail disorders: Secondary | ICD-10-CM | POA: Diagnosis not present

## 2013-04-15 ENCOUNTER — Other Ambulatory Visit: Payer: Self-pay | Admitting: Internal Medicine

## 2013-04-16 NOTE — Telephone Encounter (Signed)
Please advise refill? 

## 2013-04-17 NOTE — Telephone Encounter (Signed)
Pt called to follow up on med request. Please advise.  °

## 2013-04-17 NOTE — Telephone Encounter (Signed)
[facility] [address] [facility] about the refill to see if it is approved.

## 2013-04-20 DIAGNOSIS — M47817 Spondylosis without myelopathy or radiculopathy, lumbosacral region: Secondary | ICD-10-CM | POA: Diagnosis not present

## 2013-04-20 DIAGNOSIS — G894 Chronic pain syndrome: Secondary | ICD-10-CM | POA: Diagnosis not present

## 2013-04-20 DIAGNOSIS — M25579 Pain in unspecified ankle and joints of unspecified foot: Secondary | ICD-10-CM | POA: Diagnosis not present

## 2013-04-21 ENCOUNTER — Telehealth: Payer: Self-pay | Admitting: Internal Medicine

## 2013-04-21 NOTE — Telephone Encounter (Signed)
The patient's husband called hoping to get her in for an appointment regarding a leg wound. Her last visit was a physical in March.  When going to schedule her husband stated she still has Medicaid, [employer].  Do you still want her to be seen?  I'll be happy to call her husband back and schedule the appointment to look at her leg wound.   Thanks!

## 2013-04-22 NOTE — Telephone Encounter (Signed)
It is best to see a [facility] doctor beacuse our ref to [facility] will not be valid since we are not a part of [facility]. Thx

## 2013-04-27 NOTE — Telephone Encounter (Signed)
Pt called to follow up on this. Please call pt back

## 2013-04-27 NOTE — Telephone Encounter (Signed)
Spoke with pt, pt is aware of Dr. Macario Golds respond.

## 2013-05-01 ENCOUNTER — Encounter: Payer: Self-pay | Admitting: Internal Medicine

## 2013-05-01 ENCOUNTER — Ambulatory Visit (INDEPENDENT_AMBULATORY_CARE_PROVIDER_SITE_OTHER): Payer: Medicare Other | Admitting: Internal Medicine

## 2013-05-01 VITALS — BP 130/80 | HR 80 | Temp 99.2°F | Resp 16 | Wt 127.0 lb

## 2013-05-01 DIAGNOSIS — L97319 Non-pressure chronic ulcer of right ankle with unspecified severity: Secondary | ICD-10-CM

## 2013-05-01 DIAGNOSIS — L84 Corns and callosities: Secondary | ICD-10-CM

## 2013-05-01 DIAGNOSIS — E1169 Type 2 diabetes mellitus with other specified complication: Secondary | ICD-10-CM

## 2013-05-01 DIAGNOSIS — L97309 Non-pressure chronic ulcer of unspecified ankle with unspecified severity: Secondary | ICD-10-CM

## 2013-05-01 DIAGNOSIS — E11622 Type 2 diabetes mellitus with other skin ulcer: Secondary | ICD-10-CM

## 2013-05-01 MED ORDER — MUPIROCIN 2 % EX OINT: TOPICAL_OINTMENT | Freq: Two times a day (BID) | CUTANEOUS | Status: DC

## 2013-05-01 NOTE — Assessment & Plan Note (Signed)
7/13, 7/14 R 3 mm - trophic [facility]

## 2013-05-03 ENCOUNTER — Encounter: Payer: Self-pay | Admitting: Internal Medicine

## 2013-05-03 NOTE — Progress Notes (Signed)
Subjective:    HPI  C/o R outer ankle sore x 6 mo - hitting it w/a walker a lot. Smoking 1 ppd  The patient presents for a follow-up of  chronic hypertension, chronic dyslipidemia, type 2 diabetes controlled with medicines  F/u wt loss  better  Wt Readings from Last 3 Encounters:  05/01/13 127 lb (57.607 kg)  12/24/12 125 lb (56.7 kg)  03/31/12 122 lb (55.339 kg)   BP Readings from Last 3 Encounters:  05/01/13 130/80  12/24/12 140/76  03/31/12 120/70     Review of Systems  Constitutional: Positive for fatigue. Negative for activity change, appetite change and unexpected weight change.  HENT: Negative for congestion, mouth sores and sinus pressure.   Eyes: Negative for visual disturbance.  Respiratory: Negative for chest tightness.   Gastrointestinal: Negative for nausea.  Genitourinary: Negative for frequency, difficulty urinating and vaginal pain.  Musculoskeletal: Positive for gait problem. Negative for back pain.  Skin: Positive for wound (R ankle). Negative for pallor and rash.  Neurological: Negative for tremors, weakness and numbness.  Psychiatric/Behavioral: Positive for sleep disturbance. Negative for suicidal ideas and confusion. The patient is nervous/anxious.        Objective:   Physical Exam  Constitutional: She appears well-developed and well-nourished. No distress.  HENT:  Head: Normocephalic.  Right Ear: External ear normal.  Left Ear: External ear normal.  Nose: Nose normal.  Mouth/Throat: Oropharynx is clear and moist.  Eyes: Conjunctivae are normal. Pupils are equal, round, and reactive to light. Right eye exhibits no discharge. Left eye exhibits no discharge.  Neck: Normal range of motion. Neck supple. No JVD present. No tracheal deviation present. No thyromegaly present.  Cardiovascular: Normal rate, regular rhythm and normal heart sounds.   Pulmonary/Chest: No stridor. No respiratory distress. She has no wheezes.  Abdominal: Soft. Bowel  sounds are normal. She exhibits no distension and no mass. There is no tenderness. There is no rebound and no guarding.  Musculoskeletal: She exhibits no edema and no tenderness.  Lymphadenopathy:    She has no cervical adenopathy.  Neurological: She displays abnormal reflex. No cranial nerve deficit. She exhibits abnormal muscle tone. Coordination abnormal.  w/c  Skin: No rash noted. No erythema.  R lat malleolus with a 3 mm dry scab   Psychiatric: She has a normal mood and affect. Her behavior is normal. Judgment and thought content normal.  R popl art pulse is good Ankle arteries pulse is diminished some  Lab Results  Component Value Date   WBC 7.7 12/24/2012   HGB 15.4* 12/24/2012   HCT 45.9 12/24/2012   PLT 220.0 12/24/2012   GLUCOSE 107* 12/24/2012   CHOL 240* 12/24/2012   TRIG 123.0 12/24/2012   HDL 104.30 12/24/2012   LDLDIRECT 114.8 12/24/2012   ALT 12 12/24/2012   AST 18 12/24/2012   NA 138 12/24/2012   K 4.8 12/24/2012   CL 98 12/24/2012   CREATININE 0.6 12/24/2012   BUN 9 12/24/2012   CO2 31 12/24/2012   TSH 2.27 12/24/2012   INR 1.0 ratio 10/21/2009   HGBA1C 6.8* 12/24/2012   Procedure: R lat ankle ulcer scab debridement Indication: R lat ankle ulcer w/scab, painful  Consent: verbal  Risks and benefits were explained to the patient. Skin was cleaned with alcohol. I removed a large scab carefully with a round blade. Clean ulcer crater was revealed. Pain is better. Tolerated well. Complications: none. Bandaid and antibiotic ointment applied.  Clean ulcer with soap and water, pat  dry. Use dressing change daily. Cover with a band aid.        Assessment & Plan:

## 2013-05-19 ENCOUNTER — Ambulatory Visit (HOSPITAL_COMMUNITY)
Admission: RE | Admit: 2013-05-19 | Discharge: 2013-05-19 | Disposition: A | Discharge status: Home / Self Care | Payer: Medicare Other | Source: Ambulatory Visit | Attending: General Surgery | Admitting: General Surgery

## 2013-05-19 ENCOUNTER — Encounter (HOSPITAL_BASED_OUTPATIENT_CLINIC_OR_DEPARTMENT_OTHER): Payer: Medicare Other | Attending: General Surgery

## 2013-05-19 ENCOUNTER — Other Ambulatory Visit (HOSPITAL_BASED_OUTPATIENT_CLINIC_OR_DEPARTMENT_OTHER): Payer: Self-pay | Admitting: General Surgery

## 2013-05-19 DIAGNOSIS — E1169 Type 2 diabetes mellitus with other specified complication: Secondary | ICD-10-CM | POA: Diagnosis not present

## 2013-05-19 DIAGNOSIS — M869 Osteomyelitis, unspecified: Secondary | ICD-10-CM

## 2013-05-19 DIAGNOSIS — G589 Mononeuropathy, unspecified: Secondary | ICD-10-CM | POA: Insufficient documentation

## 2013-05-19 DIAGNOSIS — L97309 Non-pressure chronic ulcer of unspecified ankle with unspecified severity: Secondary | ICD-10-CM | POA: Insufficient documentation

## 2013-05-19 DIAGNOSIS — I10 Essential (primary) hypertension: Secondary | ICD-10-CM | POA: Insufficient documentation

## 2013-05-19 DIAGNOSIS — Z8673 Personal history of transient ischemic attack (TIA), and cerebral infarction without residual deficits: Secondary | ICD-10-CM | POA: Diagnosis not present

## 2013-05-19 DIAGNOSIS — M25579 Pain in unspecified ankle and joints of unspecified foot: Secondary | ICD-10-CM | POA: Diagnosis not present

## 2013-05-19 DIAGNOSIS — R7309 Other abnormal glucose: Secondary | ICD-10-CM | POA: Diagnosis not present

## 2013-05-19 DIAGNOSIS — E785 Hyperlipidemia, unspecified: Secondary | ICD-10-CM | POA: Diagnosis not present

## 2013-05-19 DIAGNOSIS — T07XXXA Unspecified multiple injuries, initial encounter: Secondary | ICD-10-CM | POA: Diagnosis not present

## 2013-05-19 NOTE — H&P (Signed)
NAMEMACEE, VENABLES NO.:  [id number]  MEDICAL RECORD NO.:  [id number]  LOCATION:  XRAY                         FACILITY:  [facility]  PHYSICIAN:  Joanne Gavel, M.D.        DATE OF BIRTH:  Apr 23, 1951  DATE OF ADMISSION:  05/19/2013 DATE OF DISCHARGE:                             HISTORY & PHYSICAL   CHIEF COMPLAINT:  Sore right lateral ankle.  HISTORY OF PRESENT ILLNESS:  This is a 63 year old female.  In 1999, she had an episode of sepsis and organ failure.  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  She uses a walker and a wheelchair.  Approximately 1 year ago, she developed an ulcer on her right heel which healed spontaneously and 6 months ago, an ulcer on her right lateral ankle, which has failed to heal.  The patient has diabetes in addition with some neuropathy mostly on the left side.  PAST MEDICAL HISTORY:  Hypertension, dyslipidemia, diabetes type 2, poor short-term memory, pancreatitis, seizures, neuropathy, and episodes of sepsis in the remote past.  PAST SURGICAL HISTORY:  Several operations for "intestinal blockage" many years ago.  SOCIAL HISTORY:  Cigarettes none.  Alcohol none.  MEDICATIONS:  Dilaudid, Phenergan, carbidopa, zolpidem, lorazepam, morphine, carbidopa/levodopa, tolterodine.  ALLERGY:  ASA intolerance.  REVIEW OF SYSTEMS:  The patient has a chronic pain syndrome, and is a [facility].  PHYSICAL EXAMINATION:  VITAL SIGNS:  Temperature 99.1, pulse 101, respirations 16, blood pressure 115/75.  Glucose is 120. GENERAL APPEARANCE:  Well developed, well nourished. CHEST:  Clear. HEART:  Regular rhythm. ABDOMEN:  Not examined. EXTREMITIES:  Examination of the lower extremities reveals ABI of 1.1 bilaterally.  At the right ankle, there is a 0.4 x 0.3 lesion surrounded by some redness.  There appears to be some deformity of the ankle with a very prominent bone at the lateral  malleolus.  IMPRESSION:  Diabetic ulcer Wagner 2, right lateral malleolus.  I have debrided skin covering the overhang and we will start with silver collagen.  I have ordered laboratory tests and x-rays of the ankle. Peripheral pulses are 4x4 and ABI is good, so we will forego vascular studies at present.  We will see her in 7 days.     Joanne Gavel, M.D.     RA/MEDQ  D:  05/19/2013  T:  05/19/2013  Job:  [id number]

## 2013-05-20 DIAGNOSIS — G890 Central pain syndrome: Secondary | ICD-10-CM | POA: Diagnosis not present

## 2013-05-20 DIAGNOSIS — Z79899 Other long term (current) drug therapy: Secondary | ICD-10-CM | POA: Diagnosis not present

## 2013-05-20 DIAGNOSIS — G894 Chronic pain syndrome: Secondary | ICD-10-CM | POA: Diagnosis not present

## 2013-05-20 DIAGNOSIS — M25579 Pain in unspecified ankle and joints of unspecified foot: Secondary | ICD-10-CM | POA: Diagnosis not present

## 2013-05-26 DIAGNOSIS — I10 Essential (primary) hypertension: Secondary | ICD-10-CM | POA: Diagnosis not present

## 2013-05-26 DIAGNOSIS — E1169 Type 2 diabetes mellitus with other specified complication: Secondary | ICD-10-CM | POA: Diagnosis not present

## 2013-05-26 DIAGNOSIS — L97309 Non-pressure chronic ulcer of unspecified ankle with unspecified severity: Secondary | ICD-10-CM | POA: Diagnosis not present

## 2013-05-26 DIAGNOSIS — G589 Mononeuropathy, unspecified: Secondary | ICD-10-CM | POA: Diagnosis not present

## 2013-06-02 ENCOUNTER — Encounter (HOSPITAL_BASED_OUTPATIENT_CLINIC_OR_DEPARTMENT_OTHER): Payer: Medicare Other | Attending: General Surgery

## 2013-06-02 ENCOUNTER — Other Ambulatory Visit: Payer: Self-pay | Admitting: Internal Medicine

## 2013-06-02 DIAGNOSIS — E1169 Type 2 diabetes mellitus with other specified complication: Secondary | ICD-10-CM | POA: Insufficient documentation

## 2013-06-02 DIAGNOSIS — L97309 Non-pressure chronic ulcer of unspecified ankle with unspecified severity: Secondary | ICD-10-CM | POA: Diagnosis not present

## 2013-06-05 ENCOUNTER — Telehealth: Payer: Self-pay | Admitting: Neurology

## 2013-06-10 NOTE — Telephone Encounter (Signed)
Assigned to Dr. Penumalli. 

## 2013-06-11 ENCOUNTER — Telehealth: Payer: Self-pay | Admitting: Diagnostic Neuroimaging

## 2013-06-11 MED ORDER — CARBIDOPA-LEVODOPA 25-100 MG PO TABS: 1.0000 | ORAL_TABLET | Freq: Three times a day (TID) | ORAL | Status: DC

## 2013-06-11 MED ORDER — CARBIDOPA 25 MG PO TABS: 25.0000 mg | ORAL_TABLET | Freq: Every day | ORAL | Status: DC

## 2013-06-11 NOTE — Telephone Encounter (Signed)
[facility] patient assigned to Dr Penumalli. 

## 2013-06-15 DIAGNOSIS — G894 Chronic pain syndrome: Secondary | ICD-10-CM | POA: Diagnosis not present

## 2013-06-15 DIAGNOSIS — Z79899 Other long term (current) drug therapy: Secondary | ICD-10-CM | POA: Diagnosis not present

## 2013-06-15 DIAGNOSIS — M47817 Spondylosis without myelopathy or radiculopathy, lumbosacral region: Secondary | ICD-10-CM | POA: Diagnosis not present

## 2013-06-16 ENCOUNTER — Telehealth: Payer: Self-pay | Admitting: *Deleted

## 2013-06-16 DIAGNOSIS — E1169 Type 2 diabetes mellitus with other specified complication: Secondary | ICD-10-CM | POA: Diagnosis not present

## 2013-06-16 DIAGNOSIS — L97309 Non-pressure chronic ulcer of unspecified ankle with unspecified severity: Secondary | ICD-10-CM | POA: Diagnosis not present

## 2013-06-16 NOTE — Telephone Encounter (Signed)
Left detailed message on VM to refax to [phone].

## 2013-06-16 NOTE — Telephone Encounter (Signed)
I don't think I've seen it Thx

## 2013-06-16 NOTE — Telephone Encounter (Signed)
Marshell from [facility] [employer] called requesting status of Insulin Supplies request form; states form faxed on 9.3.14 and [phone].  Please advise

## 2013-06-18 ENCOUNTER — Telehealth: Payer: Self-pay | Admitting: Internal Medicine

## 2013-06-18 DIAGNOSIS — S0083XA Contusion of other part of head, initial encounter: Secondary | ICD-10-CM

## 2013-06-18 NOTE — Telephone Encounter (Signed)
The pt called hoping to get an xray due to a fall she had on Sunday night.  She states it affected her face, and she did experience some swelling.  She states the swelling is going down, but would still like an xray.  She called her pain management dr (Dr.Phillips), but he stated the order had to come from her PCP.  Thanks!   Callback - [phone] (cell)

## 2013-06-18 NOTE — Telephone Encounter (Signed)
OK - scheduled Xray OV any provider this wk Thx

## 2013-06-18 NOTE — Telephone Encounter (Signed)
Pt informed

## 2013-06-19 ENCOUNTER — Ambulatory Visit (INDEPENDENT_AMBULATORY_CARE_PROVIDER_SITE_OTHER)
Admission: RE | Admit: 2013-06-19 | Discharge: 2013-06-19 | Disposition: A | Discharge status: Home / Self Care | Payer: Medicare Other | Source: Ambulatory Visit | Attending: Internal Medicine | Admitting: Internal Medicine

## 2013-06-19 ENCOUNTER — Encounter: Payer: Self-pay | Admitting: Internal Medicine

## 2013-06-19 ENCOUNTER — Ambulatory Visit (INDEPENDENT_AMBULATORY_CARE_PROVIDER_SITE_OTHER): Payer: Medicare Other | Admitting: Internal Medicine

## 2013-06-19 VITALS — BP 130/82 | HR 98 | Temp 99.4°F | Ht 65.0 in | Wt 114.0 lb

## 2013-06-19 DIAGNOSIS — R519 Headache, unspecified: Secondary | ICD-10-CM

## 2013-06-19 DIAGNOSIS — E119 Type 2 diabetes mellitus without complications: Secondary | ICD-10-CM | POA: Diagnosis not present

## 2013-06-19 DIAGNOSIS — F329 Major depressive disorder, single episode, unspecified: Secondary | ICD-10-CM | POA: Diagnosis not present

## 2013-06-19 DIAGNOSIS — R51 Headache: Secondary | ICD-10-CM | POA: Diagnosis not present

## 2013-06-19 DIAGNOSIS — G8929 Other chronic pain: Secondary | ICD-10-CM

## 2013-06-19 DIAGNOSIS — J449 Chronic obstructive pulmonary disease, unspecified: Secondary | ICD-10-CM

## 2013-06-19 DIAGNOSIS — S0993XA Unspecified injury of face, initial encounter: Secondary | ICD-10-CM | POA: Diagnosis not present

## 2013-06-19 NOTE — Assessment & Plan Note (Signed)
stable overall by history and exam, recent data reviewed with pt, and pt to continue medical treatment as before,  to f/u any worsening symptoms or concerns Lab Results  Component Value Date   HGBA1C 6.8* 12/24/2012   Urged to make f/u with Dr Posey Rea for repaet testing as she is due

## 2013-06-19 NOTE — Assessment & Plan Note (Signed)
stable overall by history and exam, recent data reviewed with pt, and pt to continue medical treatment as before,  to f/u any worsening symptoms or concerns Lab Results  Component Value Date   WBC 7.7 12/24/2012   HGB 15.4* 12/24/2012   HCT 45.9 12/24/2012   PLT 220.0 12/24/2012   GLUCOSE 107* 12/24/2012   CHOL 240* 12/24/2012   TRIG 123.0 12/24/2012   HDL 104.30 12/24/2012   LDLDIRECT 114.8 12/24/2012   ALT 12 12/24/2012   AST 18 12/24/2012   NA 138 12/24/2012   K 4.8 12/24/2012   CL 98 12/24/2012   CREATININE 0.6 12/24/2012   BUN 9 12/24/2012   CO2 31 12/24/2012   TSH 2.27 12/24/2012   INR 1.0 ratio 10/21/2009   HGBA1C 6.8* 12/24/2012

## 2013-06-19 NOTE — Assessment & Plan Note (Signed)
stable overall by history and exam, recent data reviewed with pt, and pt to continue medical treatment as before,  to f/u any worsening symptoms or concerns SpO2 Readings from Last 3 Encounters:  06/19/13 93%  04/30/11 93%  01/13/10 95%

## 2013-06-19 NOTE — Patient Instructions (Addendum)
Please continue all other medications as before  Please go to the XRAY Department in the Basement (go straight as you get off the elevator) for the x-ray testing  You will be contacted by phone if any changes need to be made immediately.  Otherwise, you will receive a letter about your results with an explanation, but please check with MyChart first.  Please remember to see Dr Posey Rea for further follow up in the next few weeks

## 2013-06-19 NOTE — Assessment & Plan Note (Signed)
To cont same med tx for now

## 2013-06-19 NOTE — Assessment & Plan Note (Signed)
Mild to mod tender/swelling/erythema x 5 days post fall, ok for films, cont other pain med,  to f/u any worsening symptoms or concerns

## 2013-06-19 NOTE — Progress Notes (Signed)
Subjective:    Patient ID: [name], female    DOB: 1951-04-28, 62 y.o.   MRN: [id number]  HPI  Pt of Dr Posey Rea, walks with walker at home s/p CVA with short term memory dysfxn, she's not sure how fell, but fell on the hardwood floor late last sun night, still with pain to right max sinus area with swelling, on chronic pain med, but pain still about 6/10(prob 8-9 without med); also with bruise to right knee jsut below the patella, but no other HA, fever, Gu symptoms, or change in chronic cough/copd.  No overt bleeding, laceration.  Did not see ER.  Asking for films today due to ongoing pain.  Denies worsening depressive symptoms, suicidal ideation, or panic;  Pt denies polydipsia, polyuria .  Past Medical History  Diagnosis Date  . Depression   . GERD (gastroesophageal reflux disease)   . Seizure disorder   . Gait disorder   . Anoxic brain injury   . Chronic neck pain   . Chronic pain in shoulder   . COPD (chronic obstructive pulmonary disease)   . LBP (low back pain)   . Osteoarthritis   . Type II or unspecified type diabetes mellitus without mention of complication, not stated as uncontrolled 2010  . Anxiety   . Vitamin B 12 deficiency 2011  . Hyperlipidemia    Past Surgical History  Procedure Laterality Date  . Pancreatectomy    . Hernia repair      incisional    reports that she has been smoking.  She does not have any smokeless tobacco history on file. She reports that she does not drink alcohol or use illicit drugs. family history includes Heart disease (age of onset: 41) in her father; Hypertension in her other. Allergies  Allergen Reactions  . Alendronate Sodium   . Fluoxetine Hcl    Current Outpatient Prescriptions on File Prior to Visit  Medication Sig Dispense Refill  . aspirin 81 MG EC tablet Take 81 mg by mouth daily.        . Carbidopa 25 MG tablet Take 1 tablet by mouth daily.  30 tablet  1  . carbidopa-levodopa (SINEMET IR) 25-100 MG per tablet Take 1  tablet by mouth 3 (three) times daily.  90 tablet  1  . Cholecalciferol (VITAMIN D) 2000 UNITS tablet Take 1 tablet (2,000 Units total) by mouth daily.  100 tablet  3  . DETROL LA 4 MG 24 hr capsule TAKE ONE CAPSULE AT BEDTIME  90 capsule  3  . ergocalciferol (VITAMIN D2) 50000 UNITS capsule Take 1 capsule (50,000 Units total) by mouth once a week.  6 capsule  0  . Foot Care Products (DIABETIC INSOLES) MISC Please dispense the above along with 1 pair of diabetic shoes to Mrs. Howton Dx: 250.00  2 each  0  . FreeStyle Unistick II Lancets MISC daily.        Marland Kitchen Glucerna (GLUCERNA) LIQD Take 1 Can by mouth daily.  90 Can  3  . glucose blood (FREESTYLE LITE) test strip 1 each daily. Use as instructed       . HYDROmorphone (DILAUDID) 2 MG tablet Take 4 mg by mouth every 4 (four) hours as needed.        Marland Kitchen l-methylfolate-B6-B12 (METANX) 3-35-2 MG TABS Take 1 tablet by mouth 2 (two) times daily.  180 tablet  3  . LORazepam (ATIVAN) 2 MG tablet TAKE 1 TABLET THREE TIMES A DAY  90 tablet  3  .  metFORMIN (GLUCOPHAGE-XR) 500 MG 24 hr tablet TAKE 1 TABLET THREE TIMES A DAY  270 tablet  1  . morphine (KADIAN) 60 MG 24 hr capsule Take 60 mg by mouth 2 (two) times daily.        . mupirocin ointment (BACTROBAN) 2 % Apply topically 2 (two) times daily.  22 g  0  . promethazine (PHENERGAN) 12.5 MG tablet TAKE 1 TABLET BY MOUTH EVERY 6 HOURS AS NEEDED FOR NAUSEA  60 tablet  3  . promethazine (PHENERGAN) 12.5 MG tablet TAKE 1 TABLET BY MOUTH EVERY 6 HOURS AS NEEDED FOR NAUSEA  60 tablet  0  . promethazine (PHENERGAN) 12.5 MG tablet TAKE 1 TABLET BY MOUTH EVERY 6 HOURS AS NEEDED FOR NAUSEA  60 tablet  0  . vitamin B-12 (CYANOCOBALAMIN) 500 MCG tablet Take 1 tablet (500 mcg total) by mouth daily.  100 tablet  3  . mirtazapine (REMERON) 15 MG tablet Take 1 tablet (15 mg total) by mouth at bedtime. Take at 6-8 pm  30 tablet  5  . promethazine (PHENERGAN) 12.5 MG tablet Take 12.5 mg by mouth every 6 (six) hours as needed.       . ranitidine (ZANTAC) 300 MG capsule Take 1 capsule (300 mg total) by mouth every evening.  30 capsule  3  . zolpidem (AMBIEN) 10 MG tablet Take 1 tablet (10 mg total) by mouth at bedtime as needed for sleep.  90 tablet  1  . [DISCONTINUED] DETROL LA 4 MG 24 hr capsule TAKE ONE CAPSULE AT BEDTIME  90 capsule  1   No current facility-administered medications on file prior to visit.   Review of Systems  Constitutional: Negative for unexpected weight change, or unusual diaphoresis  HENT: Negative for tinnitus.   Eyes: Negative for photophobia and visual disturbance.  Respiratory: Negative for choking and stridor.   Gastrointestinal: Negative for vomiting and blood in stool.  Genitourinary: Negative for hematuria and decreased urine volume.  Musculoskeletal: Negative for acute joint swelling Skin: Negative for color change and wound.  Neurological: Negative for tremors and numbness other than noted  Psychiatric/Behavioral: Negative for decreased concentration or  hyperactivity.       Objective:   Physical Exam BP 130/82  Pulse 98  Temp(Src) 99.4 F (37.4 C) (Oral)  Ht 5\' 5"  (1.651 m)  Wt 114 lb (51.71 kg)  BMI 18.97 kg/m2  SpO2 93% VS noted,  Constitutional: Pt appears well-developed and well-nourished.  HENT: Head: NCAT.  Right Ear: External ear normal.  Left Ear: External ear normal.  Eyes: Conjunctivae and EOM are normal. Pupils are equal, round, and reactive to light.  Neck: Normal range of motion. Neck supple.  Cardiovascular: Normal rate and regular rhythm.   Pulmonary/Chest: Effort normal and breath sounds normal.  - no rales or wheezing, BS somewhat diminished Neurological: Pt is alert. At baseline confusion per caretaker with her Skin: Skin is warm. No erythema. Except for nondiscrete area approx 1.5-2 cm right max sinus area tender, swelling, mild erythema (but also wearing some makeup)  Psychiatric: Pt behavior is normal. Thought content normal. mild nervous     Assessment & Plan:

## 2013-06-20 ENCOUNTER — Other Ambulatory Visit: Payer: Self-pay | Admitting: Internal Medicine

## 2013-06-22 ENCOUNTER — Other Ambulatory Visit: Payer: Self-pay | Admitting: Internal Medicine

## 2013-06-22 NOTE — Telephone Encounter (Signed)
Spoke with Pharmacy and verified that it was filled and pt has refills

## 2013-06-23 NOTE — Telephone Encounter (Signed)
Ok to refill? Last OV 8.1.14 Last filled 3.26.14

## 2013-06-25 ENCOUNTER — Telehealth: Payer: Self-pay | Admitting: *Deleted

## 2013-06-25 ENCOUNTER — Other Ambulatory Visit: Payer: Self-pay

## 2013-06-25 MED ORDER — CARBIDOPA-LEVODOPA 25-100 MG PO TABS: 1.0000 | ORAL_TABLET | Freq: Three times a day (TID) | ORAL | Status: DC

## 2013-06-25 MED ORDER — CARBIDOPA 25 MG PO TABS: 25.0000 mg | ORAL_TABLET | Freq: Every day | ORAL | Status: DC

## 2013-06-25 NOTE — Telephone Encounter (Signed)
Pharmacist from [facility] called requesting Ambien 10mg  #90 refill.  Please advise

## 2013-06-25 NOTE — Telephone Encounter (Signed)
OK to fill this prescription with additional refills x5 Thank you!  

## 2013-06-25 NOTE — Telephone Encounter (Signed)
[facility] patient.  Has an appt in 2015.

## 2013-06-25 NOTE — Telephone Encounter (Signed)
rx sent

## 2013-06-29 ENCOUNTER — Other Ambulatory Visit: Payer: Self-pay | Admitting: Internal Medicine

## 2013-07-03 ENCOUNTER — Other Ambulatory Visit: Payer: Self-pay | Admitting: Internal Medicine

## 2013-07-06 NOTE — Telephone Encounter (Signed)
Pt called to follow up med request. Please advise.

## 2013-07-07 ENCOUNTER — Telehealth: Payer: Self-pay | Admitting: Internal Medicine

## 2013-07-07 ENCOUNTER — Other Ambulatory Visit: Payer: Self-pay | Admitting: Internal Medicine

## 2013-07-07 NOTE — Telephone Encounter (Signed)
Patient is requesting a refill on her Ativan, call pt when this is complete

## 2013-07-08 NOTE — Telephone Encounter (Signed)
OK to fill this prescription with additional refills x4 Thank you!  

## 2013-07-10 DIAGNOSIS — I739 Peripheral vascular disease, unspecified: Secondary | ICD-10-CM | POA: Diagnosis not present

## 2013-07-10 DIAGNOSIS — E1159 Type 2 diabetes mellitus with other circulatory complications: Secondary | ICD-10-CM | POA: Diagnosis not present

## 2013-07-10 DIAGNOSIS — L608 Other nail disorders: Secondary | ICD-10-CM | POA: Diagnosis not present

## 2013-07-13 ENCOUNTER — Encounter: Payer: Self-pay | Admitting: Internal Medicine

## 2013-07-13 ENCOUNTER — Ambulatory Visit (INDEPENDENT_AMBULATORY_CARE_PROVIDER_SITE_OTHER): Payer: Medicare Other | Admitting: Internal Medicine

## 2013-07-13 ENCOUNTER — Telehealth: Payer: Self-pay | Admitting: Internal Medicine

## 2013-07-13 VITALS — BP 120/80 | HR 80 | Temp 98.8°F | Resp 16 | Wt 128.0 lb

## 2013-07-13 DIAGNOSIS — Z23 Encounter for immunization: Secondary | ICD-10-CM | POA: Diagnosis not present

## 2013-07-13 DIAGNOSIS — L97319 Non-pressure chronic ulcer of right ankle with unspecified severity: Secondary | ICD-10-CM

## 2013-07-13 DIAGNOSIS — E538 Deficiency of other specified B group vitamins: Secondary | ICD-10-CM | POA: Diagnosis not present

## 2013-07-13 DIAGNOSIS — L97309 Non-pressure chronic ulcer of unspecified ankle with unspecified severity: Secondary | ICD-10-CM | POA: Diagnosis not present

## 2013-07-13 DIAGNOSIS — J449 Chronic obstructive pulmonary disease, unspecified: Secondary | ICD-10-CM

## 2013-07-13 DIAGNOSIS — R269 Unspecified abnormalities of gait and mobility: Secondary | ICD-10-CM

## 2013-07-13 DIAGNOSIS — E1149 Type 2 diabetes mellitus with other diabetic neurological complication: Secondary | ICD-10-CM

## 2013-07-13 DIAGNOSIS — E785 Hyperlipidemia, unspecified: Secondary | ICD-10-CM

## 2013-07-13 DIAGNOSIS — E559 Vitamin D deficiency, unspecified: Secondary | ICD-10-CM

## 2013-07-13 DIAGNOSIS — E119 Type 2 diabetes mellitus without complications: Secondary | ICD-10-CM

## 2013-07-13 MED ORDER — ZOLPIDEM TARTRATE 10 MG PO TABS: ORAL_TABLET | ORAL | Status: DC

## 2013-07-13 NOTE — Assessment & Plan Note (Signed)
Continue with current prescription therapy as reflected on the Med list.  

## 2013-07-13 NOTE — Assessment & Plan Note (Signed)
Pt reduced cigs to 1/2 ppd

## 2013-07-13 NOTE — Assessment & Plan Note (Signed)
Protect ankle from hitting

## 2013-07-13 NOTE — Telephone Encounter (Signed)
Pt stated Dr. Macario Golds will put in lab work in for her to do prior to her appt 10/13/13. Please advise.

## 2013-07-13 NOTE — Assessment & Plan Note (Signed)
S/p remote anoxic brain injury Pt is better with Sinemet

## 2013-07-13 NOTE — Progress Notes (Signed)
   Subjective:    HPI  C/o R outer ankle sore x 12 mo - hitting it w/a recliner a lot. She has been hitting R ankle on a wooden part of her recliner  Smoking 1/2 ppd  The patient presents for a follow-up of  chronic hypertension, chronic dyslipidemia, type 2 diabetes controlled with medicines  F/u wt loss  better  Wt Readings from Last 3 Encounters:  07/13/13 128 lb (58.06 kg)  06/19/13 114 lb (51.71 kg)  05/01/13 127 lb (57.607 kg)   BP Readings from Last 3 Encounters:  07/13/13 120/80  06/19/13 130/82  05/01/13 130/80     Review of Systems  Constitutional: Positive for fatigue. Negative for activity change, appetite change and unexpected weight change.  HENT: Negative for congestion, mouth sores and sinus pressure.   Eyes: Negative for visual disturbance.  Respiratory: Negative for chest tightness.   Gastrointestinal: Negative for nausea.  Genitourinary: Negative for frequency, difficulty urinating and vaginal pain.  Musculoskeletal: Positive for gait problem. Negative for back pain.  Skin: Positive for wound (R ankle). Negative for pallor and rash.  Neurological: Negative for tremors, weakness and numbness.  Psychiatric/Behavioral: Positive for sleep disturbance. Negative for suicidal ideas and confusion. The patient is nervous/anxious.        Objective:   Physical Exam  Constitutional: She appears well-developed and well-nourished. No distress.  HENT:  Head: Normocephalic.  Right Ear: External ear normal.  Left Ear: External ear normal.  Nose: Nose normal.  Mouth/Throat: Oropharynx is clear and moist.  Eyes: Conjunctivae are normal. Pupils are equal, round, and reactive to light. Right eye exhibits no discharge. Left eye exhibits no discharge.  Neck: Normal range of motion. Neck supple. No JVD present. No tracheal deviation present. No thyromegaly present.  Cardiovascular: Normal rate, regular rhythm and normal heart sounds.   Pulmonary/Chest: No stridor. No  respiratory distress. She has no wheezes.  Abdominal: Soft. Bowel sounds are normal. She exhibits no distension and no mass. There is no tenderness. There is no rebound and no guarding.  Musculoskeletal: She exhibits no edema and no tenderness.  Lymphadenopathy:    She has no cervical adenopathy.  Neurological: She displays abnormal reflex. No cranial nerve deficit. She exhibits abnormal muscle tone. Coordination abnormal.  w/c  Skin: No rash noted. No erythema.  R lat malleolus with a 6x3 mm; 3 mm deep   Psychiatric: She has a normal mood and affect. Her behavior is normal. Judgment and thought content normal.  R popl art pulse is good Ankle arteries pulse is diminished some R foot w/trace edema  Lab Results  Component Value Date   WBC 7.7 12/24/2012   HGB 15.4* 12/24/2012   HCT 45.9 12/24/2012   PLT 220.0 12/24/2012   GLUCOSE 107* 12/24/2012   CHOL 240* 12/24/2012   TRIG 123.0 12/24/2012   HDL 104.30 12/24/2012   LDLDIRECT 114.8 12/24/2012   ALT 12 12/24/2012   AST 18 12/24/2012   NA 138 12/24/2012   K 4.8 12/24/2012   CL 98 12/24/2012   CREATININE 0.6 12/24/2012   BUN 9 12/24/2012   CO2 31 12/24/2012   TSH 2.27 12/24/2012   INR 1.0 ratio 10/21/2009   HGBA1C 6.8* 12/24/2012    R lat ankle ulcer was debridement        Assessment & Plan:

## 2013-07-14 ENCOUNTER — Ambulatory Visit (INDEPENDENT_AMBULATORY_CARE_PROVIDER_SITE_OTHER): Payer: Medicare Other

## 2013-07-14 DIAGNOSIS — M25579 Pain in unspecified ankle and joints of unspecified foot: Secondary | ICD-10-CM | POA: Diagnosis not present

## 2013-07-14 DIAGNOSIS — J4489 Other specified chronic obstructive pulmonary disease: Secondary | ICD-10-CM

## 2013-07-14 DIAGNOSIS — L97309 Non-pressure chronic ulcer of unspecified ankle with unspecified severity: Secondary | ICD-10-CM

## 2013-07-14 DIAGNOSIS — J449 Chronic obstructive pulmonary disease, unspecified: Secondary | ICD-10-CM

## 2013-07-14 DIAGNOSIS — Z23 Encounter for immunization: Secondary | ICD-10-CM

## 2013-07-14 DIAGNOSIS — R269 Unspecified abnormalities of gait and mobility: Secondary | ICD-10-CM

## 2013-07-14 DIAGNOSIS — E559 Vitamin D deficiency, unspecified: Secondary | ICD-10-CM | POA: Diagnosis not present

## 2013-07-14 DIAGNOSIS — E538 Deficiency of other specified B group vitamins: Secondary | ICD-10-CM

## 2013-07-14 DIAGNOSIS — E119 Type 2 diabetes mellitus without complications: Secondary | ICD-10-CM | POA: Diagnosis not present

## 2013-07-14 DIAGNOSIS — G894 Chronic pain syndrome: Secondary | ICD-10-CM | POA: Diagnosis not present

## 2013-07-14 DIAGNOSIS — M47817 Spondylosis without myelopathy or radiculopathy, lumbosacral region: Secondary | ICD-10-CM | POA: Diagnosis not present

## 2013-07-14 DIAGNOSIS — L97319 Non-pressure chronic ulcer of right ankle with unspecified severity: Secondary | ICD-10-CM

## 2013-07-14 LAB — BASIC METABOLIC PANEL
BUN: 10 mg/dL (ref 6–23)
CO2: 29 mEq/L (ref 19–32)
Calcium: 9.4 mg/dL (ref 8.4–10.5)
Chloride: 98 mEq/L (ref 96–112)
Creatinine, Ser: 0.6 mg/dL (ref 0.4–1.2)
GFR: 105.33 mL/min (ref >60.00)
Glucose, Bld: 121 mg/dL — ABNORMAL HIGH (ref 70–99)
Potassium: 4.6 mEq/L (ref 3.5–5.1)
Sodium: 139 mEq/L (ref 135–145)

## 2013-07-14 LAB — LIPID PANEL
Cholesterol: 219 mg/dL — ABNORMAL HIGH (ref 0–200)
HDL: 100.7 mg/dL (ref >39.00)
Total CHOL/HDL Ratio: 2
Triglycerides: 120 mg/dL (ref 0.0–149.0)
VLDL: 24 mg/dL (ref 0.0–40.0)

## 2013-07-14 LAB — HEPATIC FUNCTION PANEL
ALT: 9 U/L (ref 0–35)
AST: 19 U/L (ref 0–37)
Albumin: 4.1 g/dL (ref 3.5–5.2)
Alkaline Phosphatase: 63 U/L (ref 39–117)
Bilirubin, Direct: 0.1 mg/dL (ref 0.0–0.3)
Total Bilirubin: 0.4 mg/dL (ref 0.3–1.2)
Total Protein: 6.8 g/dL (ref 6.0–8.3)

## 2013-07-14 LAB — HEMOGLOBIN A1C: Hgb A1c MFr Bld: 6.9 % — ABNORMAL HIGH (ref 4.6–6.5)

## 2013-07-14 NOTE — Telephone Encounter (Signed)
Ok Thx 

## 2013-07-15 LAB — LDL CHOLESTEROL, DIRECT: Direct LDL: 100.6 mg/dL

## 2013-07-15 LAB — VITAMIN B12: Vitamin B-12: >1500 pg/mL — ABNORMAL HIGH (ref 211–911)

## 2013-07-15 LAB — TSH: TSH: 2.45 u[IU]/mL (ref 0.35–5.50)

## 2013-07-21 ENCOUNTER — Other Ambulatory Visit: Payer: Self-pay | Admitting: Internal Medicine

## 2013-08-12 ENCOUNTER — Other Ambulatory Visit: Payer: Self-pay | Admitting: Internal Medicine

## 2013-08-12 DIAGNOSIS — G894 Chronic pain syndrome: Secondary | ICD-10-CM | POA: Diagnosis not present

## 2013-08-12 DIAGNOSIS — Z79899 Other long term (current) drug therapy: Secondary | ICD-10-CM | POA: Diagnosis not present

## 2013-08-12 DIAGNOSIS — M25579 Pain in unspecified ankle and joints of unspecified foot: Secondary | ICD-10-CM | POA: Diagnosis not present

## 2013-08-12 DIAGNOSIS — M47817 Spondylosis without myelopathy or radiculopathy, lumbosacral region: Secondary | ICD-10-CM | POA: Diagnosis not present

## 2013-09-01 ENCOUNTER — Other Ambulatory Visit: Payer: Self-pay | Admitting: Internal Medicine

## 2013-09-08 DIAGNOSIS — Z79899 Other long term (current) drug therapy: Secondary | ICD-10-CM | POA: Diagnosis not present

## 2013-09-08 DIAGNOSIS — G894 Chronic pain syndrome: Secondary | ICD-10-CM | POA: Diagnosis not present

## 2013-09-08 DIAGNOSIS — M47817 Spondylosis without myelopathy or radiculopathy, lumbosacral region: Secondary | ICD-10-CM | POA: Diagnosis not present

## 2013-09-23 ENCOUNTER — Other Ambulatory Visit: Payer: Self-pay | Admitting: Neurology

## 2013-10-08 DIAGNOSIS — G894 Chronic pain syndrome: Secondary | ICD-10-CM | POA: Diagnosis not present

## 2013-10-08 DIAGNOSIS — M47817 Spondylosis without myelopathy or radiculopathy, lumbosacral region: Secondary | ICD-10-CM | POA: Diagnosis not present

## 2013-10-08 DIAGNOSIS — R51 Headache: Secondary | ICD-10-CM | POA: Diagnosis not present

## 2013-10-13 ENCOUNTER — Ambulatory Visit: Payer: Medicare Other | Admitting: Internal Medicine

## 2013-10-26 ENCOUNTER — Ambulatory Visit (INDEPENDENT_AMBULATORY_CARE_PROVIDER_SITE_OTHER): Payer: Medicare Other | Admitting: Internal Medicine

## 2013-10-26 ENCOUNTER — Ambulatory Visit (INDEPENDENT_AMBULATORY_CARE_PROVIDER_SITE_OTHER): Payer: Medicare Other

## 2013-10-26 ENCOUNTER — Encounter: Payer: Self-pay | Admitting: Internal Medicine

## 2013-10-26 VITALS — BP 130/70 | HR 76 | Temp 99.3°F | Resp 16 | Wt 133.0 lb

## 2013-10-26 DIAGNOSIS — Z23 Encounter for immunization: Secondary | ICD-10-CM | POA: Diagnosis not present

## 2013-10-26 DIAGNOSIS — E119 Type 2 diabetes mellitus without complications: Secondary | ICD-10-CM

## 2013-10-26 DIAGNOSIS — E538 Deficiency of other specified B group vitamins: Secondary | ICD-10-CM

## 2013-10-26 DIAGNOSIS — E785 Hyperlipidemia, unspecified: Secondary | ICD-10-CM

## 2013-10-26 DIAGNOSIS — F329 Major depressive disorder, single episode, unspecified: Secondary | ICD-10-CM

## 2013-10-26 DIAGNOSIS — M545 Low back pain, unspecified: Secondary | ICD-10-CM

## 2013-10-26 DIAGNOSIS — E1149 Type 2 diabetes mellitus with other diabetic neurological complication: Secondary | ICD-10-CM | POA: Diagnosis not present

## 2013-10-26 DIAGNOSIS — E559 Vitamin D deficiency, unspecified: Secondary | ICD-10-CM

## 2013-10-26 DIAGNOSIS — F3289 Other specified depressive episodes: Secondary | ICD-10-CM

## 2013-10-26 LAB — BASIC METABOLIC PANEL
BUN: 8 mg/dL (ref 6–23)
CO2: 30 mEq/L (ref 19–32)
Calcium: 8.9 mg/dL (ref 8.4–10.5)
Chloride: 99 mEq/L (ref 96–112)
Creatinine, Ser: 0.6 mg/dL (ref 0.4–1.2)
GFR: 118.59 mL/min (ref >60.00)
Glucose, Bld: 186 mg/dL — ABNORMAL HIGH (ref 70–99)
Potassium: 4.7 mEq/L (ref 3.5–5.1)
Sodium: 137 mEq/L (ref 135–145)

## 2013-10-26 LAB — LIPID PANEL
Cholesterol: 228 mg/dL — ABNORMAL HIGH (ref 0–200)
HDL: 109.7 mg/dL (ref >39.00)
Total CHOL/HDL Ratio: 2
Triglycerides: 98 mg/dL (ref 0.0–149.0)
VLDL: 19.6 mg/dL (ref 0.0–40.0)

## 2013-10-26 LAB — HEPATIC FUNCTION PANEL
ALT: 11 U/L (ref 0–35)
AST: 18 U/L (ref 0–37)
Albumin: 3.9 g/dL (ref 3.5–5.2)
Alkaline Phosphatase: 62 U/L (ref 39–117)
Bilirubin, Direct: 0.1 mg/dL (ref 0.0–0.3)
Total Bilirubin: 0.6 mg/dL (ref 0.3–1.2)
Total Protein: 6.7 g/dL (ref 6.0–8.3)

## 2013-10-26 LAB — VITAMIN B12: Vitamin B-12: >1500 pg/mL — ABNORMAL HIGH (ref 211–911)

## 2013-10-26 LAB — LDL CHOLESTEROL, DIRECT: Direct LDL: 100.8 mg/dL

## 2013-10-26 LAB — HEMOGLOBIN A1C: Hgb A1c MFr Bld: 7 % — ABNORMAL HIGH (ref 4.6–6.5)

## 2013-10-26 MED ORDER — PROMETHAZINE HCL 12.5 MG PO TABS: ORAL_TABLET | ORAL | Status: DC

## 2013-10-26 MED ORDER — LORAZEPAM 2 MG PO TABS: ORAL_TABLET | ORAL | Status: DC

## 2013-10-26 MED ORDER — ERGOCALCIFEROL 1.25 MG (50000 UT) PO CAPS: 50000.0000 [IU] | ORAL_CAPSULE | ORAL | Status: DC

## 2013-10-26 NOTE — Assessment & Plan Note (Signed)
Continue with current prescription therapy as reflected on the Med list.  

## 2013-10-26 NOTE — Assessment & Plan Note (Signed)
Restart

## 2013-10-26 NOTE — Progress Notes (Signed)
   Subjective:    HPI  F/u R outer ankle sore x 12 mo - hitting it w/a recliner a lot. She has been hitting R ankle on a wooden part of her recliner  Smoking 1/2 ppd  The patient presents for a follow-up of  chronic hypertension, chronic dyslipidemia, type 2 diabetes controlled with medicines  F/u wt loss  better  Wt Readings from Last 3 Encounters:  10/26/13 133 lb (60.328 kg)  07/13/13 128 lb (58.06 kg)  06/19/13 114 lb (51.71 kg)   BP Readings from Last 3 Encounters:  10/26/13 130/70  07/13/13 120/80  06/19/13 130/82     Review of Systems  Constitutional: Positive for fatigue. Negative for activity change, appetite change and unexpected weight change.  HENT: Negative for congestion, mouth sores and sinus pressure.   Eyes: Negative for visual disturbance.  Respiratory: Negative for chest tightness.   Gastrointestinal: Negative for nausea.  Genitourinary: Negative for frequency, difficulty urinating and vaginal pain.  Musculoskeletal: Positive for gait problem. Negative for back pain.  Skin: Positive for wound (R ankle). Negative for pallor and rash.  Neurological: Negative for tremors, weakness and numbness.  Psychiatric/Behavioral: Positive for sleep disturbance. Negative for suicidal ideas and confusion. The patient is nervous/anxious.        Objective:   Physical Exam  Constitutional: She appears well-developed and well-nourished. No distress.  HENT:  Head: Normocephalic.  Right Ear: External ear normal.  Left Ear: External ear normal.  Nose: Nose normal.  Mouth/Throat: Oropharynx is clear and moist.  Eyes: Conjunctivae are normal. Pupils are equal, round, and reactive to light. Right eye exhibits no discharge. Left eye exhibits no discharge.  Neck: Normal range of motion. Neck supple. No JVD present. No tracheal deviation present. No thyromegaly present.  Cardiovascular: Normal rate, regular rhythm and normal heart sounds.   Pulmonary/Chest: No stridor. No  respiratory distress. She has no wheezes.  Abdominal: Soft. Bowel sounds are normal. She exhibits no distension and no mass. There is no tenderness. There is no rebound and no guarding.  Musculoskeletal: She exhibits no edema and no tenderness.  Lymphadenopathy:    She has no cervical adenopathy.  Neurological: She displays abnormal reflex. No cranial nerve deficit. She exhibits abnormal muscle tone. Coordination abnormal.  w/c  Skin: No rash noted. No erythema.  R lat malleolus with a 6x3 mm; 3 mm deep   Psychiatric: She has a normal mood and affect. Her behavior is normal. Judgment and thought content normal.  R popl art pulse is good Ankle arteries pulse is diminished some R foot w/trace edema  Lab Results  Component Value Date   WBC 7.7 12/24/2012   HGB 15.4* 12/24/2012   HCT 45.9 12/24/2012   PLT 220.0 12/24/2012   GLUCOSE 121* 07/14/2013   CHOL 219* 07/14/2013   TRIG 120.0 07/14/2013   HDL 100.70 07/14/2013   LDLDIRECT 100.6 07/14/2013   ALT 9 07/14/2013   AST 19 07/14/2013   NA 139 07/14/2013   K 4.6 07/14/2013   CL 98 07/14/2013   CREATININE 0.6 07/14/2013   BUN 10 07/14/2013   CO2 29 07/14/2013   TSH 2.45 07/14/2013   INR 1.0 ratio 10/21/2009   HGBA1C 6.9* 07/14/2013    R lat ankle ulcer is better        Assessment & Plan:

## 2013-10-26 NOTE — Progress Notes (Signed)
Pre visit review using our clinic review tool, if applicable. No additional management support is needed unless otherwise documented below in the visit note. 

## 2013-10-27 LAB — TSH: TSH: 1.75 u[IU]/mL (ref 0.35–5.50)

## 2013-11-03 ENCOUNTER — Ambulatory Visit (INDEPENDENT_AMBULATORY_CARE_PROVIDER_SITE_OTHER)
Admission: RE | Admit: 2013-11-03 | Discharge: 2013-11-03 | Disposition: A | Discharge status: Home / Self Care | Payer: Medicare Other | Source: Ambulatory Visit | Attending: Family Medicine | Admitting: Family Medicine

## 2013-11-03 ENCOUNTER — Encounter: Payer: Self-pay | Admitting: Family Medicine

## 2013-11-03 ENCOUNTER — Ambulatory Visit (INDEPENDENT_AMBULATORY_CARE_PROVIDER_SITE_OTHER): Payer: Medicare Other | Admitting: Family Medicine

## 2013-11-03 VITALS — BP 136/72 | HR 97 | Temp 98.4°F | Resp 16

## 2013-11-03 DIAGNOSIS — M25529 Pain in unspecified elbow: Secondary | ICD-10-CM | POA: Diagnosis not present

## 2013-11-03 DIAGNOSIS — S59919A Unspecified injury of unspecified forearm, initial encounter: Secondary | ICD-10-CM | POA: Diagnosis not present

## 2013-11-03 DIAGNOSIS — S59909A Unspecified injury of unspecified elbow, initial encounter: Secondary | ICD-10-CM | POA: Diagnosis not present

## 2013-11-03 DIAGNOSIS — M25522 Pain in left elbow: Secondary | ICD-10-CM | POA: Insufficient documentation

## 2013-11-03 DIAGNOSIS — M25429 Effusion, unspecified elbow: Secondary | ICD-10-CM | POA: Diagnosis not present

## 2013-11-03 NOTE — Progress Notes (Signed)
   CC: Left elbow pain  HPI: Patient is a 63 year old female who unfortunately fell last night and does have a left elbow pain. Patient fell last night while she was getting up to the bathroom. She fell directly on hardwood floors and hit it very hard. Patient states she landed directly on the posterior aspect of her elbow. Patient had pain immediately with some mild swelling. Patient's husband stated that there was a superficial cut to the area which he cleaned with him being a CNA. Patient states that her elbow is extremely sore to touch or any type of movement. Patient denies any radiation of the arm any numbness, or any loss of strength. Patient does go to a pain medication management center and is on Dilaudid constantly. Patient states the pain is somewhat controlled with this medication. Patient states the pain though is approximately 7/10 in severity. Has not tried any other home modalities at this time.   Past medical, surgical, family and social history reviewed. Medications reviewed all in the electronic medical record.   Review of Systems: No headache, visual changes, nausea, vomiting, diarrhea, constipation, dizziness, abdominal pain, skin rash, fevers, chills, night sweats, weight loss, swollen lymph nodes, body aches, joint swelling, muscle aches, chest pain, shortness of breath, mood changes.   Objective:    Blood pressure 136/72, pulse 97, temperature 98.4 F (36.9 C), temperature source Oral, resp. rate 16, SpO2 92.00%.   General: No apparent distress alert and oriented x3 mood and affect normal, dressed appropriately. Frail HEENT: Pupils equal, extraocular movements intact Respiratory: Patient's speak in full sentences and does not appear short of breath Cardiovascular: No lower extremity edema, non tender, no erythema Skin: Warm dry intact with no signs of infection or rash on extremities or on axial skeleton. Abdomen: Soft nontender Neuro: Cranial nerves II through XII are  intact, neurovascularly intact in all extremities with 2+ DTRs and 2+ pulses. Lymph: No lymphadenopathy of posterior or anterior cervical chain or axillae bilaterally.  Gait normal with good balance and coordination.  MSK: Non tender with full range of motion and good stability and symmetric strength and tone of shoulders, wrist, hip, knee and ankles bilaterally.  Elbow: Left Inspection shows the patient does have very small abrasions to the area. Range of motion is restricted in the last 10 of flexion and extension as well as supination and pronation. Strength is full to all of the above directions Stable to varus, valgus stress. Negative moving valgus stress test. Generalized tenderness to palpation but no discrete area of tenderness Ulnar nerve does not sublux. Negative cubital tunnel Tinel's. Good grip strength Contralateral shoulder unremarkable  X-rays are ordered reviewed and interpreted by me today. X-rays of the left shoulder show questionable positive fat pad sign with a very small shoulder effusion but no true fracture noted.     Impression and Recommendations:     This case required medical decision making of moderate complexity.

## 2013-11-03 NOTE — Patient Instructions (Signed)
Good to meet you Continue your pain meds Wear sling most of the time  Only come out to ice 20 minutes 3 times daily Come back next week.

## 2013-11-03 NOTE — Progress Notes (Signed)
Pre-visit discussion using our clinic review tool. No additional management support is needed unless otherwise documented below in the visit note.  

## 2013-11-03 NOTE — Assessment & Plan Note (Signed)
Patient's elbow pain likely secondary to a potential fracture with positive fat pad sign. Do not see any displaced fracture of the radial head at this time. We will treat conservatively in a sling, patient will continue her pain medications, discussed care for the abrasion on her arm. Patient will come back in one week. As long as she is doing better we'll get it to do some range of motion exercises.

## 2013-11-05 DIAGNOSIS — M25579 Pain in unspecified ankle and joints of unspecified foot: Secondary | ICD-10-CM | POA: Diagnosis not present

## 2013-11-05 DIAGNOSIS — M25529 Pain in unspecified elbow: Secondary | ICD-10-CM | POA: Diagnosis not present

## 2013-11-05 DIAGNOSIS — M47817 Spondylosis without myelopathy or radiculopathy, lumbosacral region: Secondary | ICD-10-CM | POA: Diagnosis not present

## 2013-11-05 DIAGNOSIS — G890 Central pain syndrome: Secondary | ICD-10-CM | POA: Diagnosis not present

## 2013-11-10 ENCOUNTER — Ambulatory Visit (INDEPENDENT_AMBULATORY_CARE_PROVIDER_SITE_OTHER): Payer: Medicare Other | Admitting: Family Medicine

## 2013-11-10 ENCOUNTER — Encounter: Payer: Self-pay | Admitting: Family Medicine

## 2013-11-10 VITALS — BP 124/70 | HR 102 | Temp 100.4°F | Resp 16

## 2013-11-10 DIAGNOSIS — J209 Acute bronchitis, unspecified: Secondary | ICD-10-CM | POA: Insufficient documentation

## 2013-11-10 DIAGNOSIS — M25522 Pain in left elbow: Secondary | ICD-10-CM

## 2013-11-10 DIAGNOSIS — M25529 Pain in unspecified elbow: Secondary | ICD-10-CM

## 2013-11-10 MED ORDER — AMOXICILLIN-POT CLAVULANATE 875-125 MG PO TABS: 1.0000 | ORAL_TABLET | Freq: Two times a day (BID) | ORAL | Status: DC

## 2013-11-10 NOTE — Assessment & Plan Note (Signed)
Patient does have what appears to be an acute bronchitis. Patient does have a low grade fever as well as some mild cervical lymphadenopathy and scattered wheezes Patient was given Augmentin to cover any type of serious infection. Patient will come back again in 48 hours if not completely resolved.

## 2013-11-10 NOTE — Assessment & Plan Note (Signed)
Patient's elbow pain seems to be getting better. Patient was given home exercise program for range of motion exercises as well as strengthening I think will be good. Patient has what she continues to do well can followup on an as-needed basis.

## 2013-11-10 NOTE — Progress Notes (Signed)
Pre-visit discussion using our clinic review tool. No additional management support is needed unless otherwise documented below in the visit note.  

## 2013-11-10 NOTE — Patient Instructions (Signed)
Glad you are doing better.  Start exercises 3 times a week Ice can still help If cold is not better come back again by Friday Otherwise I will miss You!

## 2013-11-10 NOTE — Progress Notes (Signed)
   CC: Left elbow pain follow up.  HPI: Patient is a 63 year old female who is here for left total pain followup. Patient did have x-rays which were negative for any type of fracture but there was a positive fat pad sign. Patient was in a sling for 72 hours and has started range of motion exercises. Patient states that the pain is getting much better. Overall though she has not been feeling very well. Patient actually does have a fever today at 100.4 and has had chronic cough for quite some time. Patient states of the elbow seems to be doing very well and has decreased bruising as well as swelling.   Past medical, surgical, family and social history reviewed. Medications reviewed all in the electronic medical record.   Review of Systems: No headache, visual changes, nausea, vomiting, diarrhea, constipation, dizziness, abdominal pain, skin rash, fevers, chills, night sweats, weight loss, swollen lymph nodes, body aches, joint swelling, muscle aches, chest pain, shortness of breath, mood changes.   Objective:    Blood pressure 124/70, pulse 102, temperature 100.4 F (38 C), temperature source Oral, resp. rate 16, SpO2 92.00%.   General: No apparent distress alert and oriented x3 mood and affect normal, dressed appropriately. Frail HEENT: Pupils equal, extraocular movements intact Respiratory: Patient's speak in full sentences and does not appear short of breath, patient though does have scattered wheezes throughout. Mild anterior cervical lymphadenopathy Cardiovascular: No lower extremity edema, non tender, no erythema Skin: Warm dry intact with no signs of infection or rash on extremities or on axial skeleton. Abdomen: Soft nontender Neuro: Cranial nerves II through XII are intact, neurovascularly intact in all extremities with 2+ DTRs and 2+ pulses. Lymph: No lymphadenopathy of posterior or anterior cervical chain or axillae bilaterally.  Gait normal with good balance and coordination.  MSK:  Non tender with full range of motion and good stability and symmetric strength and tone of shoulders, wrist, hip, knee and ankles bilaterally.  Elbow: Left Inspection shows the patient does have very small abrasions to the area that is well scarred over signs of infection Range of motion is  full flexion and extension as well as supination and pronation. Strength is full to all of the above directions Stable to varus, valgus stress. Negative moving valgus stress test. Nontender on exam Ulnar nerve does not sublux. Negative cubital tunnel Tinel's. Good grip strength Contralateral shoulder unremarkable     Impression and Recommendations:     This case required medical decision making of moderate complexity.

## 2013-11-19 ENCOUNTER — Telehealth: Payer: Self-pay | Admitting: *Deleted

## 2013-11-19 MED ORDER — MAGIC MOUTHWASH W/LIDOCAINE: 10.0000 mL | Freq: Three times a day (TID) | ORAL | Status: DC

## 2013-11-19 NOTE — Telephone Encounter (Signed)
Discussed very common side effect Given mouth wash, discussed stop smoking and green leafy vegetables would be helpful.  RTC in 1 week if not better

## 2013-11-19 NOTE — Telephone Encounter (Signed)
Patient phoned & thinks that the recently prescribed amoxicillin has caused her tongue to turn "dark".  No other allergic reaxns noted.  Please advise.  CB# [phone]

## 2013-11-27 ENCOUNTER — Ambulatory Visit (INDEPENDENT_AMBULATORY_CARE_PROVIDER_SITE_OTHER): Payer: Medicare Other | Admitting: Ophthalmology

## 2013-12-03 ENCOUNTER — Ambulatory Visit: Payer: Self-pay | Admitting: Diagnostic Neuroimaging

## 2013-12-03 DIAGNOSIS — G890 Central pain syndrome: Secondary | ICD-10-CM | POA: Diagnosis not present

## 2013-12-03 DIAGNOSIS — G894 Chronic pain syndrome: Secondary | ICD-10-CM | POA: Diagnosis not present

## 2013-12-03 DIAGNOSIS — Z79899 Other long term (current) drug therapy: Secondary | ICD-10-CM | POA: Diagnosis not present

## 2013-12-03 DIAGNOSIS — M25579 Pain in unspecified ankle and joints of unspecified foot: Secondary | ICD-10-CM | POA: Diagnosis not present

## 2013-12-07 ENCOUNTER — Telehealth: Payer: Self-pay | Admitting: *Deleted

## 2013-12-07 DIAGNOSIS — S81809A Unspecified open wound, unspecified lower leg, initial encounter: Secondary | ICD-10-CM

## 2013-12-07 DIAGNOSIS — R270 Ataxia, unspecified: Secondary | ICD-10-CM

## 2013-12-07 DIAGNOSIS — S91009A Unspecified open wound, unspecified ankle, initial encounter: Secondary | ICD-10-CM

## 2013-12-07 DIAGNOSIS — S81009A Unspecified open wound, unspecified knee, initial encounter: Secondary | ICD-10-CM

## 2013-12-07 NOTE — Telephone Encounter (Signed)
I'll ref to 1. Wound clinic and 2. Dr Everlena CooperJaffe  Thx

## 2013-12-07 NOTE — Telephone Encounter (Signed)
[name], patient's spouse, phoned stating that patient has a new non-healing wound on her right ankle & is requesting advice on whether to schedule an OV with PCP or wound clinic.  Per spouse, it is an open wound, approximately the size of a nickel, states it is "red/raw flesh" and states peri-wound is also red,  (saw Dr. Vear Jackson who recommended to f/u with PCP or wound clinic).  States wound is on same ankle patient had to have debrided before.  Please advise.  Also, he states that Dr. Sandria Jackson (neurologist) has retired and Dr. Everlena Jackson has been recommended and needs a referral for Dr. Everlena Jackson. Please advise. Also, patient is almost out of her [name].  Last OV with PCP 10/26/13  CB# [phone]

## 2013-12-07 NOTE — Telephone Encounter (Signed)
Phoned and notified spouse of referrals.  Please advise regarding levodopa.

## 2013-12-11 ENCOUNTER — Ambulatory Visit (INDEPENDENT_AMBULATORY_CARE_PROVIDER_SITE_OTHER): Payer: Medicare Other | Admitting: Ophthalmology

## 2013-12-11 DIAGNOSIS — H43819 Vitreous degeneration, unspecified eye: Secondary | ICD-10-CM | POA: Diagnosis not present

## 2013-12-11 DIAGNOSIS — E11319 Type 2 diabetes mellitus with unspecified diabetic retinopathy without macular edema: Secondary | ICD-10-CM | POA: Diagnosis not present

## 2013-12-11 DIAGNOSIS — E1165 Type 2 diabetes mellitus with hyperglycemia: Secondary | ICD-10-CM

## 2013-12-11 DIAGNOSIS — H3530 Unspecified macular degeneration: Secondary | ICD-10-CM

## 2013-12-11 DIAGNOSIS — H2510 Age-related nuclear cataract, unspecified eye: Secondary | ICD-10-CM

## 2013-12-11 DIAGNOSIS — E1139 Type 2 diabetes mellitus with other diabetic ophthalmic complication: Secondary | ICD-10-CM

## 2013-12-11 MED ORDER — CARBIDOPA-LEVODOPA 25-100 MG PO TABS: 1.0000 | ORAL_TABLET | Freq: Three times a day (TID) | ORAL | Status: DC

## 2013-12-11 NOTE — Telephone Encounter (Signed)
Done. Thx.

## 2013-12-11 NOTE — Addendum Note (Signed)
Addended by: Tresa GarterPLOTNIKOV, ALEKSEI V on: 12/11/2013 05:27 PM   Modules accepted: Orders

## 2013-12-11 NOTE — Telephone Encounter (Signed)
Please review & advise regarding levodopa---closing all my encounters.

## 2013-12-16 ENCOUNTER — Encounter (HOSPITAL_BASED_OUTPATIENT_CLINIC_OR_DEPARTMENT_OTHER): Payer: Medicare Other | Attending: General Surgery

## 2013-12-16 DIAGNOSIS — Z79899 Other long term (current) drug therapy: Secondary | ICD-10-CM | POA: Insufficient documentation

## 2013-12-16 DIAGNOSIS — Z993 Dependence on wheelchair: Secondary | ICD-10-CM | POA: Insufficient documentation

## 2013-12-16 DIAGNOSIS — G939 Disorder of brain, unspecified: Secondary | ICD-10-CM | POA: Diagnosis not present

## 2013-12-16 DIAGNOSIS — G839 Paralytic syndrome, unspecified: Secondary | ICD-10-CM | POA: Diagnosis not present

## 2013-12-16 DIAGNOSIS — Z7982 Long term (current) use of aspirin: Secondary | ICD-10-CM | POA: Insufficient documentation

## 2013-12-16 DIAGNOSIS — L97309 Non-pressure chronic ulcer of unspecified ankle with unspecified severity: Secondary | ICD-10-CM | POA: Diagnosis not present

## 2013-12-16 DIAGNOSIS — E119 Type 2 diabetes mellitus without complications: Secondary | ICD-10-CM | POA: Diagnosis not present

## 2013-12-16 NOTE — Progress Notes (Signed)
[facility]  NAME:  [name], [name]                  ACCOUNT NO.:  [id number]  MEDICAL RECORD NO.:  [id number]      DATE OF BIRTH:  [birth year]  PHYSICIAN:  Ardath SaxPeter Apolonia Ellwood, M.D.           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  She is wheelchair bound, unable to walk, but other than that, her neurologic problems have stabilized.  She also has type 2 diabetes.  Her medicines are aspirin, carbidopa, vitamins.  She also takes Dilaudid daily, Ativan, Glucophage, Remeron, Glucerna and Phenergan.  She was found to have a blood pressure of 129/78, respirations 18, pulse 69, temperature 98.3, she is 5 feet 5 inches, weighs 128 pounds.  Her problem is that she hit her lateral right ankle  on a wheelchair and has developed an ulcer, and I thought it was very superficial were calling it a traumatic wound to the right ankle.  I debrided some nonviable tissue out of the middle of the small ulcer that is only about 2 mm in diameter.  We are going to treat it with antibiotic ointment and a dressing, and she will come back in a week.  DIAGNOSES: 1. Small traumatic ulcer lateral right ankle. 2. Neurologic weakness of her lower extremities due to sepsis and some     sort of brain damage at the time of sepsis.     Ardath SaxPeter Taden Witter, M.D.     PP/MEDQ  D:  12/16/2013  T:  12/16/2013  Job:  [id number]

## 2013-12-23 ENCOUNTER — Other Ambulatory Visit: Payer: Self-pay | Admitting: Internal Medicine

## 2013-12-23 DIAGNOSIS — G939 Disorder of brain, unspecified: Secondary | ICD-10-CM | POA: Diagnosis not present

## 2013-12-23 DIAGNOSIS — Z79899 Other long term (current) drug therapy: Secondary | ICD-10-CM | POA: Diagnosis not present

## 2013-12-23 DIAGNOSIS — E119 Type 2 diabetes mellitus without complications: Secondary | ICD-10-CM | POA: Diagnosis not present

## 2013-12-23 DIAGNOSIS — Z7982 Long term (current) use of aspirin: Secondary | ICD-10-CM | POA: Diagnosis not present

## 2013-12-23 DIAGNOSIS — L97309 Non-pressure chronic ulcer of unspecified ankle with unspecified severity: Secondary | ICD-10-CM | POA: Diagnosis not present

## 2013-12-23 DIAGNOSIS — G839 Paralytic syndrome, unspecified: Secondary | ICD-10-CM | POA: Diagnosis not present

## 2013-12-24 ENCOUNTER — Other Ambulatory Visit: Payer: Self-pay | Admitting: Internal Medicine

## 2013-12-24 NOTE — Telephone Encounter (Signed)
Rx sent 

## 2013-12-29 DIAGNOSIS — Z79899 Other long term (current) drug therapy: Secondary | ICD-10-CM | POA: Diagnosis not present

## 2013-12-29 DIAGNOSIS — G894 Chronic pain syndrome: Secondary | ICD-10-CM | POA: Diagnosis not present

## 2013-12-29 DIAGNOSIS — M47817 Spondylosis without myelopathy or radiculopathy, lumbosacral region: Secondary | ICD-10-CM | POA: Diagnosis not present

## 2014-01-05 ENCOUNTER — Ambulatory Visit (INDEPENDENT_AMBULATORY_CARE_PROVIDER_SITE_OTHER): Payer: Medicare Other | Admitting: Neurology

## 2014-01-05 ENCOUNTER — Encounter: Payer: Self-pay | Admitting: Neurology

## 2014-01-05 VITALS — BP 130/80 | HR 94 | Wt 135.1 lb

## 2014-01-05 DIAGNOSIS — R269 Unspecified abnormalities of gait and mobility: Secondary | ICD-10-CM | POA: Diagnosis not present

## 2014-01-05 NOTE — Progress Notes (Signed)
[facility] Clinic Note - Initial Visit   Date: 01/05/2014   [name] MRN: [id number] DOB: 08/17/1951   Dear Dr Posey Rea:  Thank you for your kind referral of [name] for consultation of gait difficulty. Although her history is well known to you, please allow [place] to reiterate it for the purpose of our medical record. The patient was accompanied to the clinic by husband who also provides collateral information.     History of Present Illness: [name] is a 63 y.o. right-handed Caucasian female with history of diabetes mellitus (HbA1c 7.0), septicemia from GI origin with unresponsiveness and possible seizure (1999) complicated by hepatic encephalopathy, watershed infarcts, and anoxic brain injury, prolonged respiratory insufficiency, central pain syndrome, chronic gait disorder, and depression.  She has been a long-time patient of Dr. Sandria Manly at [facility] and is transfering care here for ongoing problems with gait.  I have reviewed Dr. Imagene Gurney last clinic note dated 1/6/201.  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  She had 20-month hospitalization and stroke rehab stay.  Since her discharge in 1999, she has always been using a wheelchair or walker.  She predominately uses a walker and uses a wheelchair only for long distances.    In September 2008 she had a fall and had a left-sided subdural hematoma which was treated conservatively. 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 During 2012, she had continued to have frequent falls and feels as if her feet are stuck to the floor in addition to problems with turning and walking down hallways. She also has a tremor of the right foot.  She was started on Sinemet in 2013 which she feels has  improved because her feet move better and "they don't feel that they are cemented to the floor".  She is able to bathe herself and dress herself.  She has caregivers from 9:30-5pm (M-F) and 11-2pm (saturday).  She takes her sinemet IR 25/100 TID and extra carbidopa 25mg  in the morning which seems to be working for her.  She has moderate nausea with her medications, but does not know which medication it is related too.    The frequency of falls has improved to once every two months.  Husband attributes this to poor judgement and not recognizing her own limited. Falls are always at nighttime.  She is unable to get back up.  Denies any loss of smell, constipation, vivid dreams, muscle stiffness, hallucinations, and lightheadedness.   Out-side paper records, electronic medical record, and images have been reviewed where available and summarized as:  Lab Results  Component Value Date   VITAMINB12 >1500* 10/26/2013   Lab Results  Component Value Date   TSH 1.75 10/26/2013   Lab Results  Component Value Date   HGBA1C 7.0* 10/26/2013   The last MRI of the brain was in June 2012 which showed nonspecific subcortical and periventricular white matter hyperintensities and small stable bifrontal subdural hygromas.  Past Medical History  Diagnosis Date  . Depression   . GERD (gastroesophageal reflux disease)   . Seizure disorder   . Gait disorder   . Anoxic brain injury   . Chronic neck pain   . Chronic pain in shoulder   . COPD (chronic obstructive pulmonary disease)   . LBP (low back pain)   .  Osteoarthritis   . Type II or unspecified type diabetes mellitus without mention of complication, not stated as uncontrolled 2010  . Anxiety   . Vitamin B 12 deficiency 2011  . Hyperlipidemia     Past Surgical History  Procedure Laterality Date  . Pancreatectomy    . Hernia repair      incisional     Medications:  Current Outpatient Prescriptions on File Prior to Visit  Medication Sig Dispense  Refill  . Alum & Mag Hydroxide-Simeth (MAGIC MOUTHWASH W/LIDOCAINE) SOLN Take 10 mLs by mouth 3 (three) times daily. Swish for 2 minutes then spit out.  500 mL  1  . amoxicillin-clavulanate (AUGMENTIN) 875-125 MG per tablet Take 1 tablet by mouth 2 (two) times daily.  20 tablet  0  . aspirin 81 MG EC tablet Take 81 mg by mouth daily.        . Carbidopa 25 MG tablet Take 1 tablet by mouth daily.  90 tablet  1  . carbidopa-levodopa (SINEMET IR) 25-100 MG per tablet Take 1 tablet by mouth 3 (three) times daily.  270 tablet  3  . Cholecalciferol (VITAMIN D) 2000 UNITS tablet Take 1 tablet (2,000 Units total) by mouth daily.  100 tablet  3  . ergocalciferol (VITAMIN D2) 50000 UNITS capsule Take 1 capsule (50,000 Units total) by mouth every 30 (thirty) days.  6 capsule  1  . Foot Care Products (DIABETIC INSOLES) MISC Please dispense the above along with 1 pair of diabetic shoes to [name] Dx: 250.00  2 each  0  . FreeStyle Unistick II Lancets MISC daily.        Marland Kitchen Glucerna (GLUCERNA) LIQD Take 1 Can by mouth daily.  90 Can  3  . glucose blood (FREESTYLE LITE) test strip 1 each daily. Use as instructed       . HYDROmorphone (DILAUDID) 2 MG tablet Take 4 mg by mouth every 4 (four) hours as needed.        Marland Kitchen l-methylfolate-B6-B12 (METANX) 3-35-2 MG TABS Take 1 tablet by mouth 2 (two) times daily.  180 tablet  3  . LORazepam (ATIVAN) 2 MG tablet TAKE 1 TABLET THREE TIMES A DAY  90 tablet  3  . metFORMIN (GLUCOPHAGE-XR) 500 MG 24 hr tablet TAKE 1 TABLET THREE TIMES A DAY  270 tablet  1  . morphine (KADIAN) 60 MG 24 hr capsule Take 60 mg by mouth 2 (two) times daily.        . mupirocin ointment (BACTROBAN) 2 % Apply topically 2 (two) times daily.  22 g  0  . promethazine (PHENERGAN) 12.5 MG tablet TAKE 1 TABLET BY MOUTH EVERY 6 HOURS AS NEEDED FOR NAUSEA  60 tablet  0  . promethazine (PHENERGAN) 12.5 MG tablet TAKE 1 TABLET BY MOUTH EVERY 6 HOURS AS NEEDED FOR NAUSEA  60 tablet  3  . promethazine (PHENERGAN)  12.5 MG tablet TAKE 1 TABLET BY MOUTH EVERY 6 HOURS AS NEEDED FOR NAUSEA  60 tablet  3  . tolterodine (DETROL LA) 4 MG 24 hr capsule TAKE ONE CAPSULE AT BEDTIME  90 capsule  3  . tolterodine (DETROL LA) 4 MG 24 hr capsule TAKE ONE CAPSULE AT BEDTIME  90 capsule  3  . vitamin B-12 (CYANOCOBALAMIN) 500 MCG tablet Take 1 tablet (500 mcg total) by mouth daily.  100 tablet  3  . zolpidem (AMBIEN) 10 MG tablet TAKE 1 TABLET BY MOUTH AT BEDTIME AS NEEDED FOR SLEEP  90 tablet  1  . mirtazapine (REMERON) 15 MG tablet Take 1 tablet (15 mg total) by mouth at bedtime. Take at 6-8 pm  30 tablet  5  . promethazine (PHENERGAN) 12.5 MG tablet Take 12.5 mg by mouth every 6 (six) hours as needed.      . ranitidine (ZANTAC) 300 MG capsule Take 1 capsule (300 mg total) by mouth every evening.  30 capsule  3  . [DISCONTINUED] DETROL LA 4 MG 24 hr capsule TAKE ONE CAPSULE AT BEDTIME  90 capsule  1   No current facility-administered medications on file prior to visit.    Allergies:  Allergies  Allergen Reactions  . Alendronate Sodium   . Fluoxetine Hcl     Family History: Family History  Problem Relation Age of Onset  . Hypertension Other   . Heart disease Father [id number]    MI    Social History: History   Social History  . Marital Status: Married    Spouse Name: N/A    Number of Children: N/A  . Years of Education: N/A   Occupational History  . disabled    Social History Main Topics  . Smoking status: Current Every Day Smoker  . Smokeless tobacco: Not on file  . Alcohol Use: No  . Drug Use: No  . Sexual Activity: Yes   Other Topics Concern  . Not on file   Social History Narrative  . No narrative on file    Review of Systems:  CONSTITUTIONAL: No fevers, chills, night sweats, or weight loss.   EYES: No visual changes or eye pain ENT: No hearing changes.  No history of nose bleeds.   RESPIRATORY: No cough, wheezing and shortness of breath.   CARDIOVASCULAR: Negative for chest pain, and  palpitations.   GI: Negative for abdominal discomfort, blood in stools or black stools.  No recent change in bowel habits.   GU:  No history of incontinence.   MUSCLOSKELETAL: +history of joint pain or swelling.  No myalgias.   SKIN: Negative for lesions, rash, and itching.   HEMATOLOGY/ONCOLOGY: Negative for prolonged bleeding, bruising easily, and swollen nodes.   ENDOCRINE: Negative for cold or heat intolerance, polydipsia or goiter.   PSYCH:  +depression or anxiety symptoms.   NEURO: As Above.   Vital Signs:  BP 130/80  Pulse 94  Wt 135 lb 2 oz (61.292 kg)  SpO2 93%  Neurological Exam: MENTAL STATUS including orientation to time, place, person, recent and remote memory, attention span and concentration, language, and fund of knowledge is moderately intact. Speech is not dysarthric.  CRANIAL NERVES: II:  No visual field defects.  Unremarkable fundi.   III-IV-VI: Pupils equal round and reactive to light.  Normal conjugate, extra-ocular eye movements in all directions of gaze.  No nystagmus.  No ptosis.   V:  Normal facial sensation.  VII:  Normal facial symmetry and movements.  VIII:  Normal hearing and vestibular function.   IX-X:  Normal palatal movement.   XI:  Normal shoulder shrug and head rotation.   XII:  Normal tongue strength and range of motion, no deviation or fasciculation.  MOTOR:  Fidgety with her hands. No atrophy, fasciculations or abnormal movements.  No pronator drift.    Right Upper Extremity:    Left Upper Extremity:    Deltoid  5/5   Deltoid  5/5   Biceps  5/5   Biceps  5/5   Triceps  5/5   Triceps  5/5   Wrist extensors  5/5   Wrist extensors  5/5   Wrist flexors  5/5   Wrist flexors  5/5   Finger extensors  5/5   Finger extensors  5/5   Finger flexors  5/5   Finger flexors  5/5   Dorsal interossei  5/5   Dorsal interossei  5/5   Abductor pollicis  5/5   Abductor pollicis  5/5   Tone (Ashworth scale)  0  Tone (Ashworth scale)  0+   Right Lower  Extremity:    Left Lower Extremity:    Hip flexors  5/5   Hip flexors  5/5   Hip extensors  5/5   Hip extensors  5/5   Knee flexors  5/5   Knee flexors  5/5   Knee extensors  5/5   Knee extensors  5/5   Dorsiflexors  5/5   Dorsiflexors  5/5   Plantarflexors  5/5   Plantarflexors  5/5   Toe extensors  5/5   Toe extensors  5/5   Toe flexors  5/5   Toe flexors  5/5   Tone (Ashworth scale)  0  Tone (Ashworth scale)  0+   MSRs:  Right                                                                 Left brachioradialis 3+  brachioradialis 3+  biceps 3+  biceps 3+  triceps 3+  triceps 3+  patellar 3+  patellar 3+  ankle jerk 2+  ankle jerk 2+  Hoffman no  Hoffman no  plantar response up  plantar response up   SENSORY:  Normal and symmetric perception of light touch, pinprick, vibration, and proprioception.   COORDINATION/GAIT: Finger tapping and heel tapping intact bilaterally. Able to rise from a chair without using arms.  Wide-based gait, ataxic vs astasia abasia pattern of gait.  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  I do not find parkinsonian features on exam, but she reports having significant improvement with sinemet so I will continue it for now.  Her gait abnormality seems to be out of proportion to her imaging and exam since there is no weakness or evidence of neuropathy.  Exam is mostly notable for generalized hyperreflexia and ataxic gait.  I discussed switching to extended release form of sinemet to see if this helps with nausea, but she is happy with current regimen.  I do not feel that carbidopa 25mg  in the morning is really offering much, so have asked her to stop it and see how she does.  I also discussed that at some point, it would be reasonable to try to taper her medications since she may not necessarily need the dosages she is taking which she agrees  with.     PLAN/RECOMMENDATIONS:  1.  Continue carbidopa-levadopa IR 25/100 three times daily 2.  Stop carbidopa 3.  Start physical therapy for gait training 4.  Encouraged to use stationary bike at times per week 5.  Counseled patient at length about tobacco cessation 6.  Fall precautions discussed 7.  Return to clinic in 31-months.   The duration of this appointment  visit was 45 minutes of face-to-face time with the patient.  Greater than 50% of this time was spent in counseling, explanation of diagnosis, planning of further management, and coordination of care.   Thank you for allowing me to participate in patient's care.  If I can answer any additional questions, I would be pleased to do so.    Sincerely,    Delainee Tramel K. Posey Pronto, DO

## 2014-01-05 NOTE — Patient Instructions (Addendum)
1.  Continue carbidopa-levadopa IR 25/100 three times daily 2.  Stop carbidopa 3.  Start physical therapy for gait training 4.  Encouraged to use stationary bike at times per week 5.  Strongly encouraged to stop smoking 6.  Return to clinic in 856-months.

## 2014-01-07 ENCOUNTER — Other Ambulatory Visit: Payer: Self-pay | Admitting: Internal Medicine

## 2014-01-07 NOTE — Telephone Encounter (Signed)
Refill done.  

## 2014-01-12 ENCOUNTER — Other Ambulatory Visit: Payer: Self-pay | Admitting: Internal Medicine

## 2014-01-13 ENCOUNTER — Other Ambulatory Visit: Payer: Self-pay | Admitting: Internal Medicine

## 2014-01-13 NOTE — Telephone Encounter (Signed)
Please advise 

## 2014-01-13 NOTE — Telephone Encounter (Signed)
Last OV 10/26/13 Med last filled 07/13/13 #90 with 1 refill

## 2014-01-13 NOTE — Telephone Encounter (Signed)
Requesting Ambien refill.

## 2014-01-18 ENCOUNTER — Ambulatory Visit: Payer: Medicare Other | Attending: Neurology | Admitting: Physical Therapy

## 2014-01-18 DIAGNOSIS — IMO0001 Reserved for inherently not codable concepts without codable children: Secondary | ICD-10-CM | POA: Insufficient documentation

## 2014-01-18 DIAGNOSIS — R269 Unspecified abnormalities of gait and mobility: Secondary | ICD-10-CM | POA: Diagnosis not present

## 2014-01-18 DIAGNOSIS — R279 Unspecified lack of coordination: Secondary | ICD-10-CM | POA: Diagnosis not present

## 2014-01-18 DIAGNOSIS — R262 Difficulty in walking, not elsewhere classified: Secondary | ICD-10-CM | POA: Diagnosis not present

## 2014-01-18 MED ORDER — ZOLPIDEM TARTRATE 10 MG PO TABS: ORAL_TABLET | ORAL | Status: DC

## 2014-01-19 ENCOUNTER — Other Ambulatory Visit: Payer: Self-pay | Admitting: Internal Medicine

## 2014-01-20 ENCOUNTER — Ambulatory Visit: Payer: Medicare Other | Admitting: Physical Therapy

## 2014-01-20 ENCOUNTER — Other Ambulatory Visit: Payer: Self-pay | Admitting: *Deleted

## 2014-01-20 DIAGNOSIS — IMO0001 Reserved for inherently not codable concepts without codable children: Secondary | ICD-10-CM | POA: Diagnosis not present

## 2014-01-20 NOTE — Telephone Encounter (Signed)
Ok to refill? Last filled 07/13/13 #90 with 1 RF.

## 2014-01-21 ENCOUNTER — Telehealth: Payer: Self-pay | Admitting: Internal Medicine

## 2014-01-21 MED ORDER — ZOLPIDEM TARTRATE 10 MG PO TABS
ORAL_TABLET | ORAL | Status: DC
Start: ? — End: 2014-06-23

## 2014-01-21 NOTE — Telephone Encounter (Signed)
Pt has been out of ambien for a couple of weeks and it is causing her problems.  Can this be refilled?  There are several messages. Please call to let her know.

## 2014-01-22 NOTE — Telephone Encounter (Signed)
It was addressed through Rx Refills a couple days ago (x6 mo) Thx

## 2014-01-25 NOTE — Telephone Encounter (Signed)
Pt is aware.  

## 2014-01-26 ENCOUNTER — Ambulatory Visit: Payer: Medicare Other | Admitting: Physical Therapy

## 2014-01-26 DIAGNOSIS — IMO0001 Reserved for inherently not codable concepts without codable children: Secondary | ICD-10-CM | POA: Diagnosis not present

## 2014-01-27 ENCOUNTER — Other Ambulatory Visit (INDEPENDENT_AMBULATORY_CARE_PROVIDER_SITE_OTHER): Payer: Medicare Other

## 2014-01-27 ENCOUNTER — Ambulatory Visit (INDEPENDENT_AMBULATORY_CARE_PROVIDER_SITE_OTHER): Payer: Medicare Other | Admitting: Internal Medicine

## 2014-01-27 ENCOUNTER — Encounter: Payer: Self-pay | Admitting: Internal Medicine

## 2014-01-27 VITALS — BP 140/72 | HR 80 | Temp 99.4°F | Resp 16 | Wt 133.0 lb

## 2014-01-27 DIAGNOSIS — M545 Low back pain, unspecified: Secondary | ICD-10-CM | POA: Diagnosis not present

## 2014-01-27 DIAGNOSIS — G8929 Other chronic pain: Secondary | ICD-10-CM

## 2014-01-27 DIAGNOSIS — G894 Chronic pain syndrome: Secondary | ICD-10-CM | POA: Diagnosis not present

## 2014-01-27 DIAGNOSIS — F17200 Nicotine dependence, unspecified, uncomplicated: Secondary | ICD-10-CM

## 2014-01-27 DIAGNOSIS — E119 Type 2 diabetes mellitus without complications: Secondary | ICD-10-CM

## 2014-01-27 DIAGNOSIS — M47817 Spondylosis without myelopathy or radiculopathy, lumbosacral region: Secondary | ICD-10-CM | POA: Diagnosis not present

## 2014-01-27 DIAGNOSIS — E785 Hyperlipidemia, unspecified: Secondary | ICD-10-CM | POA: Diagnosis not present

## 2014-01-27 DIAGNOSIS — Z79899 Other long term (current) drug therapy: Secondary | ICD-10-CM | POA: Diagnosis not present

## 2014-01-27 LAB — BASIC METABOLIC PANEL
BUN: 8 mg/dL (ref 6–23)
CO2: 30 mEq/L (ref 19–32)
Calcium: 9.6 mg/dL (ref 8.4–10.5)
Chloride: 98 mEq/L (ref 96–112)
Creatinine, Ser: 0.5 mg/dL (ref 0.4–1.2)
GFR: 135.39 mL/min (ref >60.00)
Glucose, Bld: 129 mg/dL — ABNORMAL HIGH (ref 70–99)
Potassium: 4.7 mEq/L (ref 3.5–5.1)
Sodium: 136 mEq/L (ref 135–145)

## 2014-01-27 LAB — HEMOGLOBIN A1C: Hgb A1c MFr Bld: 6.7 % — ABNORMAL HIGH (ref 4.6–6.5)

## 2014-01-27 MED ORDER — VITAMIN D (ERGOCALCIFEROL) 1.25 MG (50000 UNIT) PO CAPS: ORAL_CAPSULE | ORAL | Status: DC

## 2014-01-27 MED ORDER — LORAZEPAM 2 MG PO TABS: ORAL_TABLET | ORAL | Status: DC

## 2014-01-27 MED ORDER — MIRTAZAPINE 15 MG PO TABS: 15.0000 mg | ORAL_TABLET | Freq: Every day | ORAL | Status: DC

## 2014-01-27 NOTE — Assessment & Plan Note (Signed)
Continue with current prescription therapy as reflected on the Med list.  

## 2014-01-27 NOTE — Progress Notes (Signed)
Pre visit review using our clinic review tool, if applicable. No additional management support is needed unless otherwise documented below in the visit note. 

## 2014-01-27 NOTE — Assessment & Plan Note (Signed)
Smoking less 

## 2014-01-29 ENCOUNTER — Other Ambulatory Visit: Payer: Self-pay | Admitting: Diagnostic Neuroimaging

## 2014-01-29 NOTE — Telephone Encounter (Signed)
Former [facility] patient, had an appt scheduled earlier this year with Dr Marjory LiesPenumalli, but cancelled stating she is seeing a different provider

## 2014-02-01 ENCOUNTER — Ambulatory Visit: Payer: Medicare Other | Attending: Neurology | Admitting: Physical Therapy

## 2014-02-01 DIAGNOSIS — R279 Unspecified lack of coordination: Secondary | ICD-10-CM | POA: Insufficient documentation

## 2014-02-01 DIAGNOSIS — R262 Difficulty in walking, not elsewhere classified: Secondary | ICD-10-CM | POA: Insufficient documentation

## 2014-02-01 DIAGNOSIS — R269 Unspecified abnormalities of gait and mobility: Secondary | ICD-10-CM | POA: Diagnosis not present

## 2014-02-01 DIAGNOSIS — IMO0001 Reserved for inherently not codable concepts without codable children: Secondary | ICD-10-CM | POA: Diagnosis not present

## 2014-02-02 ENCOUNTER — Encounter: Payer: Self-pay | Admitting: Internal Medicine

## 2014-02-02 NOTE — Progress Notes (Signed)
   Subjective:    HPI  F/u R outer ankle sore x 12 mo - healing. She has been hitting R ankle on a wooden part of her recliner - better  Smoking 1/2 ppd  The patient presents for a follow-up of  chronic hypertension, chronic dyslipidemia, type 2 diabetes controlled with medicines  F/u wt loss  better  Wt Readings from Last 3 Encounters:  01/27/14 133 lb (60.328 kg)  01/05/14 135 lb 2 oz (61.292 kg)  10/26/13 133 lb (60.328 kg)   BP Readings from Last 3 Encounters:  01/27/14 140/72  01/05/14 130/80  11/10/13 124/70     Review of Systems  Constitutional: Positive for fatigue. Negative for activity change, appetite change and unexpected weight change.  HENT: Negative for congestion, mouth sores and sinus pressure.   Eyes: Negative for visual disturbance.  Respiratory: Negative for chest tightness.   Gastrointestinal: Negative for nausea.  Genitourinary: Negative for frequency, difficulty urinating and vaginal pain.  Musculoskeletal: Positive for gait problem. Negative for back pain.  Skin: Positive for wound (R ankle). Negative for pallor and rash.  Neurological: Negative for tremors, weakness and numbness.  Psychiatric/Behavioral: Positive for sleep disturbance. Negative for suicidal ideas and confusion. The patient is nervous/anxious.        Objective:   Physical Exam  Constitutional: She appears well-developed and well-nourished. No distress.  HENT:  Head: Normocephalic.  Right Ear: External ear normal.  Left Ear: External ear normal.  Nose: Nose normal.  Mouth/Throat: Oropharynx is clear and moist.  Eyes: Conjunctivae are normal. Pupils are equal, round, and reactive to light. Right eye exhibits no discharge. Left eye exhibits no discharge.  Neck: Normal range of motion. Neck supple. No JVD present. No tracheal deviation present. No thyromegaly present.  Cardiovascular: Normal rate, regular rhythm and normal heart sounds.   Pulmonary/Chest: No stridor. No  respiratory distress. She has no wheezes.  Abdominal: Soft. Bowel sounds are normal. She exhibits no distension and no mass. There is no tenderness. There is no rebound and no guarding.  Musculoskeletal: She exhibits no edema and no tenderness.  Lymphadenopathy:    She has no cervical adenopathy.  Neurological: She displays abnormal reflex. No cranial nerve deficit. She exhibits abnormal muscle tone. Coordination abnormal.  w/c  Skin: No rash noted. No erythema.  R lat malleolus ulcer - healed  Psychiatric: She has a normal mood and affect. Her behavior is normal. Judgment and thought content normal.  R popl art pulse is good Ankle arteries pulse is diminished some R foot w/trace edema  Lab Results  Component Value Date   WBC 7.7 12/24/2012   HGB 15.4* 12/24/2012   HCT 45.9 12/24/2012   PLT 220.0 12/24/2012   GLUCOSE 129* 01/27/2014   CHOL 228* 10/26/2013   TRIG 98.0 10/26/2013   HDL 109.70 10/26/2013   LDLDIRECT 100.8 10/26/2013   ALT 11 10/26/2013   AST 18 10/26/2013   NA 136 01/27/2014   K 4.7 01/27/2014   CL 98 01/27/2014   CREATININE 0.5 01/27/2014   BUN 8 01/27/2014   CO2 30 01/27/2014   TSH 1.75 10/26/2013   INR 1.0 ratio 10/21/2009   HGBA1C 6.7* 01/27/2014    R lat ankle ulcer is better        Assessment & Plan:

## 2014-02-03 ENCOUNTER — Ambulatory Visit: Payer: Medicare Other | Admitting: Physical Therapy

## 2014-02-03 DIAGNOSIS — IMO0001 Reserved for inherently not codable concepts without codable children: Secondary | ICD-10-CM | POA: Diagnosis not present

## 2014-02-08 ENCOUNTER — Ambulatory Visit: Payer: Medicare Other | Admitting: Physical Therapy

## 2014-02-08 DIAGNOSIS — IMO0001 Reserved for inherently not codable concepts without codable children: Secondary | ICD-10-CM | POA: Diagnosis not present

## 2014-02-11 ENCOUNTER — Ambulatory Visit: Payer: Medicare Other | Admitting: Physical Therapy

## 2014-02-15 ENCOUNTER — Ambulatory Visit: Payer: Medicare Other | Admitting: Physical Therapy

## 2014-02-15 DIAGNOSIS — IMO0001 Reserved for inherently not codable concepts without codable children: Secondary | ICD-10-CM | POA: Diagnosis not present

## 2014-02-17 IMAGING — CR DG FACIAL BONES COMPLETE 3+V
3 series · 3 of 3 positions shown · non-contrast
Comparison: Brain MRI - 03/30/2011; head CT - 11/20/2007

CLINICAL DATA: Post fall, now with right-sided facial/maxillary
pain, initial encounter.

FACIAL BONES COMPLETE 3+V

[view not recorded (1 of 3)]
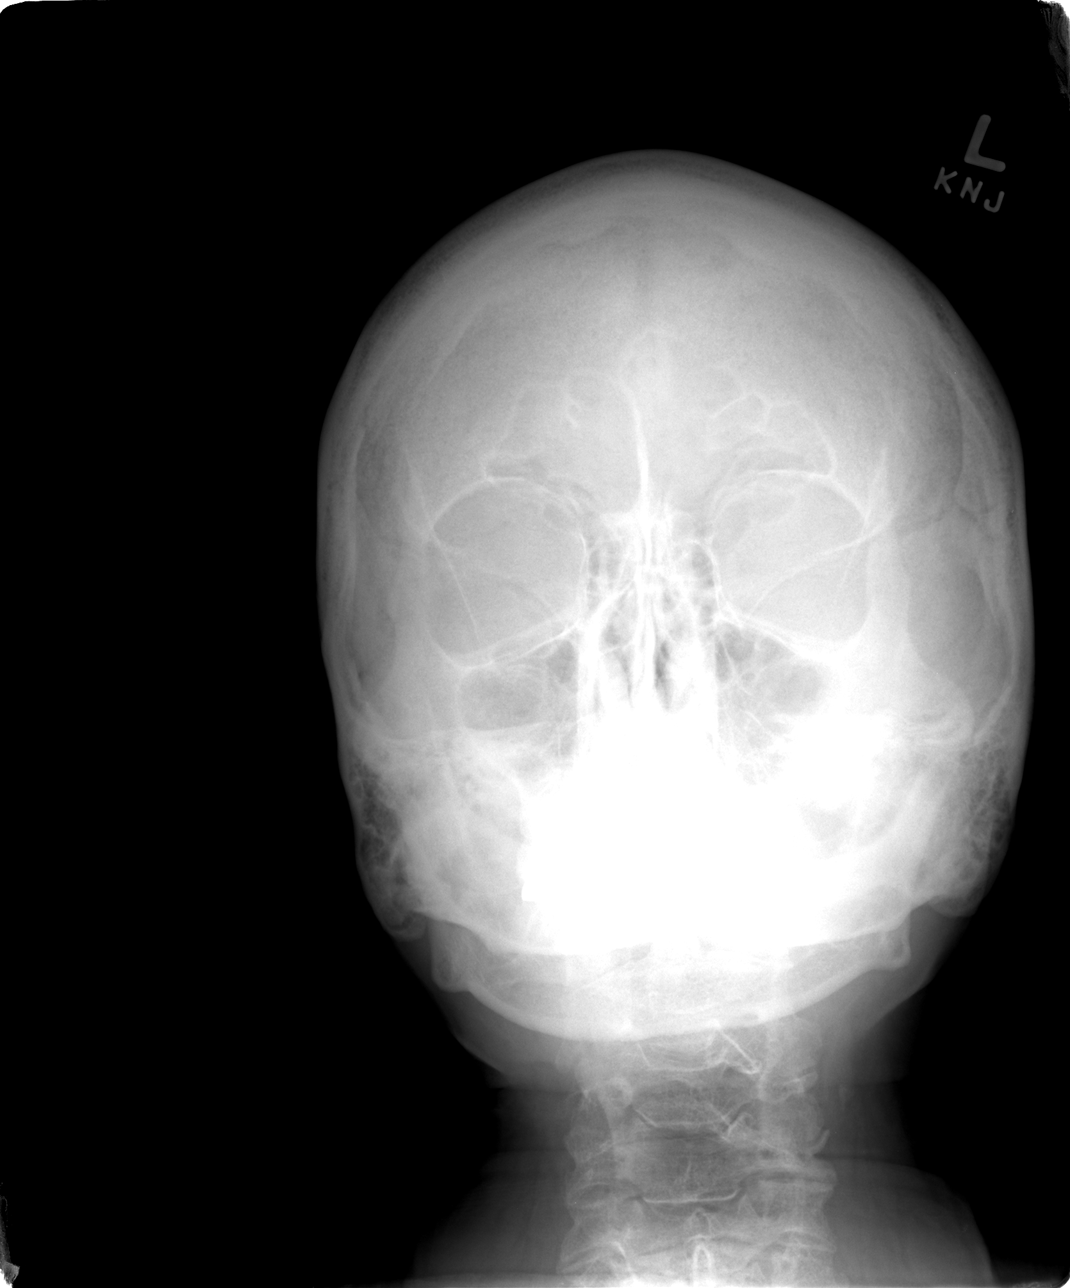

[view not recorded (2 of 3)]
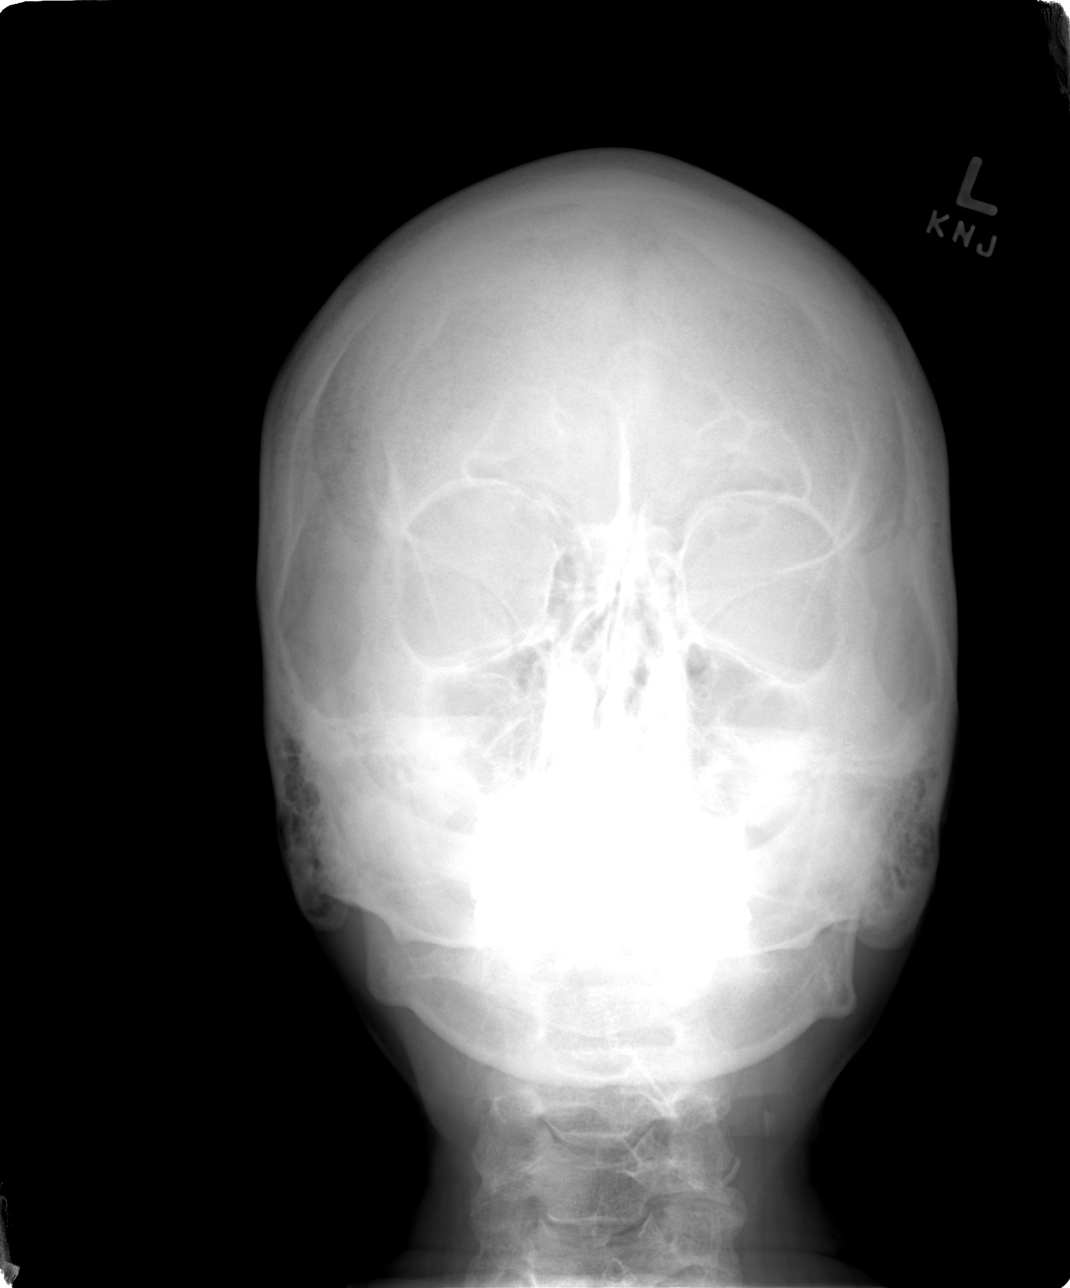

[view not recorded (3 of 3)]
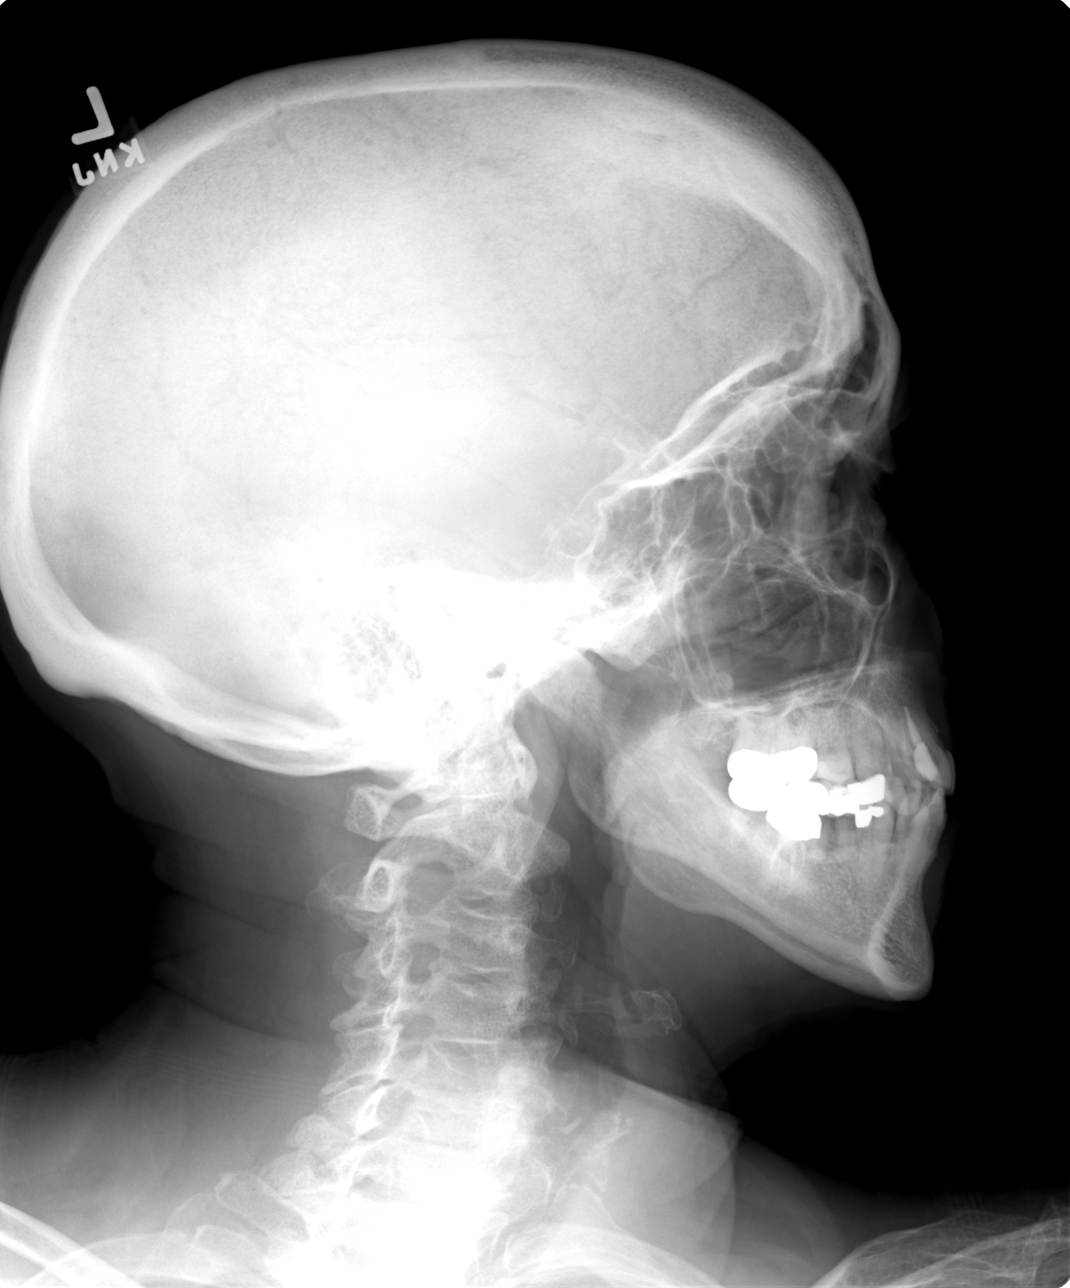

[3 of 3 positions shown; findings below may reference images not displayed]

FINDINGS: Limited visualization of the paranasal sinuses is normal.  No
definitive air fluid levels.  There is possible mild rightward
nasal septal deviation.  No radiopaque foreign body.
IMPRESSION: No definite acute findings.  Further evaluation with maxillofacial
CT may be obtained as clinically indicated.

## 2014-02-18 ENCOUNTER — Ambulatory Visit: Payer: Medicare Other | Admitting: Physical Therapy

## 2014-02-18 DIAGNOSIS — IMO0001 Reserved for inherently not codable concepts without codable children: Secondary | ICD-10-CM | POA: Diagnosis not present

## 2014-02-19 DIAGNOSIS — M47817 Spondylosis without myelopathy or radiculopathy, lumbosacral region: Secondary | ICD-10-CM | POA: Diagnosis not present

## 2014-02-19 DIAGNOSIS — Z79899 Other long term (current) drug therapy: Secondary | ICD-10-CM | POA: Diagnosis not present

## 2014-02-19 DIAGNOSIS — G894 Chronic pain syndrome: Secondary | ICD-10-CM | POA: Diagnosis not present

## 2014-02-20 ENCOUNTER — Other Ambulatory Visit: Payer: Self-pay | Admitting: Internal Medicine

## 2014-02-23 ENCOUNTER — Ambulatory Visit: Payer: Medicare Other | Admitting: Physical Therapy

## 2014-02-23 DIAGNOSIS — IMO0001 Reserved for inherently not codable concepts without codable children: Secondary | ICD-10-CM | POA: Diagnosis not present

## 2014-02-24 ENCOUNTER — Ambulatory Visit: Payer: Medicare Other | Admitting: Physical Therapy

## 2014-02-24 DIAGNOSIS — IMO0001 Reserved for inherently not codable concepts without codable children: Secondary | ICD-10-CM | POA: Diagnosis not present

## 2014-02-25 ENCOUNTER — Ambulatory Visit: Payer: Medicare Other | Admitting: Physical Therapy

## 2014-03-01 ENCOUNTER — Ambulatory Visit: Payer: Medicare Other | Attending: Neurology | Admitting: Physical Therapy

## 2014-03-01 DIAGNOSIS — R262 Difficulty in walking, not elsewhere classified: Secondary | ICD-10-CM | POA: Diagnosis not present

## 2014-03-01 DIAGNOSIS — IMO0001 Reserved for inherently not codable concepts without codable children: Secondary | ICD-10-CM | POA: Diagnosis not present

## 2014-03-01 DIAGNOSIS — R279 Unspecified lack of coordination: Secondary | ICD-10-CM | POA: Insufficient documentation

## 2014-03-01 DIAGNOSIS — R269 Unspecified abnormalities of gait and mobility: Secondary | ICD-10-CM | POA: Insufficient documentation

## 2014-03-02 ENCOUNTER — Ambulatory Visit: Payer: Medicare Other | Admitting: Physical Therapy

## 2014-03-02 DIAGNOSIS — IMO0001 Reserved for inherently not codable concepts without codable children: Secondary | ICD-10-CM | POA: Diagnosis not present

## 2014-03-04 ENCOUNTER — Ambulatory Visit: Payer: Medicare Other | Admitting: Physical Therapy

## 2014-03-08 ENCOUNTER — Ambulatory Visit: Payer: Medicare Other | Admitting: Physical Therapy

## 2014-03-08 DIAGNOSIS — IMO0001 Reserved for inherently not codable concepts without codable children: Secondary | ICD-10-CM | POA: Diagnosis not present

## 2014-03-11 ENCOUNTER — Ambulatory Visit: Payer: Medicare Other | Admitting: Physical Therapy

## 2014-03-11 DIAGNOSIS — IMO0001 Reserved for inherently not codable concepts without codable children: Secondary | ICD-10-CM | POA: Diagnosis not present

## 2014-03-15 ENCOUNTER — Ambulatory Visit: Payer: Medicare Other | Admitting: Physical Therapy

## 2014-03-15 DIAGNOSIS — IMO0001 Reserved for inherently not codable concepts without codable children: Secondary | ICD-10-CM | POA: Diagnosis not present

## 2014-03-16 DIAGNOSIS — M47817 Spondylosis without myelopathy or radiculopathy, lumbosacral region: Secondary | ICD-10-CM | POA: Diagnosis not present

## 2014-03-16 DIAGNOSIS — G894 Chronic pain syndrome: Secondary | ICD-10-CM | POA: Diagnosis not present

## 2014-03-18 ENCOUNTER — Ambulatory Visit: Payer: Medicare Other | Admitting: Physical Therapy

## 2014-03-18 DIAGNOSIS — IMO0001 Reserved for inherently not codable concepts without codable children: Secondary | ICD-10-CM | POA: Diagnosis not present

## 2014-03-22 ENCOUNTER — Ambulatory Visit: Payer: Medicare Other | Admitting: Physical Therapy

## 2014-03-22 DIAGNOSIS — IMO0001 Reserved for inherently not codable concepts without codable children: Secondary | ICD-10-CM | POA: Diagnosis not present

## 2014-03-25 ENCOUNTER — Ambulatory Visit: Payer: Medicare Other | Admitting: Physical Therapy

## 2014-03-25 DIAGNOSIS — IMO0001 Reserved for inherently not codable concepts without codable children: Secondary | ICD-10-CM | POA: Diagnosis not present

## 2014-04-05 ENCOUNTER — Ambulatory Visit: Payer: Medicare Other | Attending: Neurology | Admitting: Physical Therapy

## 2014-04-05 ENCOUNTER — Telehealth: Payer: Self-pay

## 2014-04-05 ENCOUNTER — Ambulatory Visit: Payer: Medicare Other | Admitting: Physical Therapy

## 2014-04-05 DIAGNOSIS — R269 Unspecified abnormalities of gait and mobility: Secondary | ICD-10-CM | POA: Insufficient documentation

## 2014-04-05 DIAGNOSIS — R279 Unspecified lack of coordination: Secondary | ICD-10-CM | POA: Diagnosis not present

## 2014-04-05 DIAGNOSIS — IMO0001 Reserved for inherently not codable concepts without codable children: Secondary | ICD-10-CM | POA: Insufficient documentation

## 2014-04-05 DIAGNOSIS — R262 Difficulty in walking, not elsewhere classified: Secondary | ICD-10-CM | POA: Diagnosis not present

## 2014-04-05 DIAGNOSIS — E119 Type 2 diabetes mellitus without complications: Secondary | ICD-10-CM

## 2014-04-05 NOTE — Telephone Encounter (Signed)
Diabetic bundle-lipid panel ordered 

## 2014-04-08 ENCOUNTER — Ambulatory Visit: Payer: Medicare Other | Admitting: Physical Therapy

## 2014-04-08 DIAGNOSIS — IMO0001 Reserved for inherently not codable concepts without codable children: Secondary | ICD-10-CM | POA: Diagnosis not present

## 2014-04-12 ENCOUNTER — Ambulatory Visit: Payer: Medicare Other | Admitting: Physical Therapy

## 2014-04-12 DIAGNOSIS — IMO0001 Reserved for inherently not codable concepts without codable children: Secondary | ICD-10-CM | POA: Diagnosis not present

## 2014-04-13 DIAGNOSIS — G894 Chronic pain syndrome: Secondary | ICD-10-CM | POA: Diagnosis not present

## 2014-04-13 DIAGNOSIS — Z79899 Other long term (current) drug therapy: Secondary | ICD-10-CM | POA: Diagnosis not present

## 2014-04-13 DIAGNOSIS — M47817 Spondylosis without myelopathy or radiculopathy, lumbosacral region: Secondary | ICD-10-CM | POA: Diagnosis not present

## 2014-04-13 DIAGNOSIS — G890 Central pain syndrome: Secondary | ICD-10-CM | POA: Diagnosis not present

## 2014-04-15 ENCOUNTER — Ambulatory Visit: Payer: Medicare Other | Admitting: Physical Therapy

## 2014-04-15 DIAGNOSIS — IMO0001 Reserved for inherently not codable concepts without codable children: Secondary | ICD-10-CM | POA: Diagnosis not present

## 2014-04-19 ENCOUNTER — Ambulatory Visit: Payer: Medicare Other | Admitting: Physical Therapy

## 2014-04-19 DIAGNOSIS — IMO0001 Reserved for inherently not codable concepts without codable children: Secondary | ICD-10-CM | POA: Diagnosis not present

## 2014-04-21 ENCOUNTER — Other Ambulatory Visit: Payer: Self-pay

## 2014-04-21 MED ORDER — MIRTAZAPINE 15 MG PO TABS: 15.0000 mg | ORAL_TABLET | Freq: Every day | ORAL | Status: DC

## 2014-04-22 ENCOUNTER — Ambulatory Visit: Payer: Medicare Other | Admitting: Physical Therapy

## 2014-04-22 DIAGNOSIS — IMO0001 Reserved for inherently not codable concepts without codable children: Secondary | ICD-10-CM | POA: Diagnosis not present

## 2014-04-26 ENCOUNTER — Ambulatory Visit: Payer: Medicare Other | Admitting: Physical Therapy

## 2014-04-28 ENCOUNTER — Ambulatory Visit: Payer: Medicare Other | Admitting: Physical Therapy

## 2014-04-30 ENCOUNTER — Other Ambulatory Visit: Payer: Self-pay | Admitting: Internal Medicine

## 2014-05-03 ENCOUNTER — Ambulatory Visit (INDEPENDENT_AMBULATORY_CARE_PROVIDER_SITE_OTHER)
Admission: RE | Admit: 2014-05-03 | Discharge: 2014-05-03 | Disposition: A | Discharge status: Home / Self Care | Payer: Medicare Other | Source: Ambulatory Visit | Attending: Internal Medicine | Admitting: Internal Medicine

## 2014-05-03 ENCOUNTER — Encounter: Payer: Self-pay | Admitting: Internal Medicine

## 2014-05-03 ENCOUNTER — Ambulatory Visit (INDEPENDENT_AMBULATORY_CARE_PROVIDER_SITE_OTHER): Payer: Medicare Other | Admitting: Internal Medicine

## 2014-05-03 VITALS — BP 168/80 | HR 96 | Temp 99.2°F | Resp 16 | Wt 135.0 lb

## 2014-05-03 DIAGNOSIS — S20219A Contusion of unspecified front wall of thorax, initial encounter: Secondary | ICD-10-CM

## 2014-05-03 DIAGNOSIS — R269 Unspecified abnormalities of gait and mobility: Secondary | ICD-10-CM | POA: Diagnosis not present

## 2014-05-03 DIAGNOSIS — S20211A Contusion of right front wall of thorax, initial encounter: Secondary | ICD-10-CM

## 2014-05-03 DIAGNOSIS — S298XXA Other specified injuries of thorax, initial encounter: Secondary | ICD-10-CM | POA: Diagnosis not present

## 2014-05-03 DIAGNOSIS — E119 Type 2 diabetes mellitus without complications: Secondary | ICD-10-CM

## 2014-05-03 NOTE — Patient Instructions (Signed)
Use gentle heat on chest

## 2014-05-03 NOTE — Progress Notes (Signed)
Pre visit review using our clinic review tool, if applicable. No additional management support is needed unless otherwise documented below in the visit note. 

## 2014-05-03 NOTE — Assessment & Plan Note (Signed)
PT - will re-start in the fall

## 2014-05-03 NOTE — Assessment & Plan Note (Signed)
Continue with current prescription therapy as reflected on the Med list.  

## 2014-05-03 NOTE — Progress Notes (Signed)
   Subjective:    HPI  C/o R chet and R breast pain - Zelia fell on a pice of furniture Thur last week. The pain is worse w/reaching, breathing, coughing   F/u R outer ankle sore x 12 mo - healing. She has been hitting R ankle on a wooden part of her recliner - better  Smoking 1/2 ppd  The patient presents for a follow-up of  chronic hypertension, chronic dyslipidemia, type 2 diabetes controlled with medicines  F/u wt loss  better  Wt Readings from Last 3 Encounters:  05/03/14 135 lb (61.236 kg)  01/27/14 133 lb (60.328 kg)  01/05/14 135 lb 2 oz (61.292 kg)   BP Readings from Last 3 Encounters:  05/03/14 168/80  01/27/14 140/72  01/05/14 130/80     Review of Systems  Constitutional: Positive for fatigue. Negative for activity change, appetite change and unexpected weight change.  HENT: Negative for congestion, mouth sores and sinus pressure.   Eyes: Negative for visual disturbance.  Respiratory: Negative for chest tightness.   Gastrointestinal: Negative for nausea.  Genitourinary: Negative for frequency, difficulty urinating and vaginal pain.  Musculoskeletal: Positive for gait problem. Negative for back pain.  Skin: Positive for wound (R ankle). Negative for pallor and rash.  Neurological: Negative for tremors, weakness and numbness.  Psychiatric/Behavioral: Positive for sleep disturbance. Negative for suicidal ideas and confusion. The patient is nervous/anxious.        Objective:   Physical Exam  Constitutional: She appears well-developed and well-nourished. No distress.  HENT:  Head: Normocephalic.  Right Ear: External ear normal.  Left Ear: External ear normal.  Nose: Nose normal.  Mouth/Throat: Oropharynx is clear and moist.  Eyes: Conjunctivae are normal. Pupils are equal, round, and reactive to light. Right eye exhibits no discharge. Left eye exhibits no discharge.  Neck: Normal range of motion. Neck supple. No JVD present. No tracheal deviation present. No  thyromegaly present.  Cardiovascular: Normal rate, regular rhythm and normal heart sounds.   Pulmonary/Chest: No stridor. No respiratory distress. She has no wheezes.  Abdominal: Soft. Bowel sounds are normal. She exhibits no distension and no mass. There is no tenderness. There is no rebound and no guarding.  Musculoskeletal: She exhibits no edema and no tenderness.  Lymphadenopathy:    She has no cervical adenopathy.  Neurological: She displays abnormal reflex. No cranial nerve deficit. She exhibits abnormal muscle tone. Coordination abnormal.  w/c  Skin: No rash noted. No erythema.  R lat malleolus ulcer - healed  Psychiatric: She has a normal mood and affect. Her behavior is normal. Judgment and thought content normal.  A large bruise over anterior chest below R clavicle, sensitive No crepitus R popl art pulse is good Ankle arteries pulse is diminished some R foot w/trace edema  Lab Results  Component Value Date   WBC 7.7 12/24/2012   HGB 15.4* 12/24/2012   HCT 45.9 12/24/2012   PLT 220.0 12/24/2012   GLUCOSE 129* 01/27/2014   CHOL 228* 10/26/2013   TRIG 98.0 10/26/2013   HDL 109.70 10/26/2013   LDLDIRECT 100.8 10/26/2013   ALT 11 10/26/2013   AST 18 10/26/2013   NA 136 01/27/2014   K 4.7 01/27/2014   CL 98 01/27/2014   CREATININE 0.5 01/27/2014   BUN 8 01/27/2014   CO2 30 01/27/2014   TSH 1.75 10/26/2013   INR 1.0 ratio 10/21/2009   HGBA1C 6.7* 01/27/2014           Assessment & Plan:

## 2014-05-03 NOTE — Assessment & Plan Note (Signed)
7/15 R anterior Will get a rib Xray

## 2014-05-04 ENCOUNTER — Ambulatory Visit: Payer: Medicare Other | Admitting: Internal Medicine

## 2014-05-04 ENCOUNTER — Other Ambulatory Visit: Payer: Self-pay | Admitting: Internal Medicine

## 2014-05-11 DIAGNOSIS — G890 Central pain syndrome: Secondary | ICD-10-CM | POA: Diagnosis not present

## 2014-05-11 DIAGNOSIS — M542 Cervicalgia: Secondary | ICD-10-CM | POA: Diagnosis not present

## 2014-05-11 DIAGNOSIS — M47817 Spondylosis without myelopathy or radiculopathy, lumbosacral region: Secondary | ICD-10-CM | POA: Diagnosis not present

## 2014-05-11 DIAGNOSIS — G894 Chronic pain syndrome: Secondary | ICD-10-CM | POA: Diagnosis not present

## 2014-06-07 ENCOUNTER — Other Ambulatory Visit: Payer: Self-pay | Admitting: Internal Medicine

## 2014-06-08 ENCOUNTER — Telehealth: Payer: Self-pay | Admitting: *Deleted

## 2014-06-08 ENCOUNTER — Telehealth: Payer: Self-pay

## 2014-06-08 ENCOUNTER — Other Ambulatory Visit: Payer: Self-pay

## 2014-06-08 NOTE — Telephone Encounter (Signed)
Left msg on triage md suppose to have sent lorazepam to her pharmacy. Per chart md approved already. Called pharmacy to verify if med was call back in. Spoke with [place]/pharmacist he stated no gave md approval. Notified pt med has been call into pharmacy...[name]

## 2014-06-08 NOTE — Telephone Encounter (Signed)
rx for lorazepam signed and faxed to [facility]

## 2014-06-16 DIAGNOSIS — G890 Central pain syndrome: Secondary | ICD-10-CM | POA: Diagnosis not present

## 2014-06-16 DIAGNOSIS — Z79899 Other long term (current) drug therapy: Secondary | ICD-10-CM | POA: Diagnosis not present

## 2014-06-16 DIAGNOSIS — G894 Chronic pain syndrome: Secondary | ICD-10-CM | POA: Diagnosis not present

## 2014-06-18 NOTE — Telephone Encounter (Signed)
error 

## 2014-06-20 ENCOUNTER — Emergency Department (HOSPITAL_COMMUNITY)
Admission: EM | Admit: 2014-06-20 | Discharge: 2014-06-21 | Disposition: A | Discharge status: Home / Self Care | Payer: Medicare Other | Source: Home / Self Care | Attending: Emergency Medicine | Admitting: Emergency Medicine

## 2014-06-20 ENCOUNTER — Emergency Department (HOSPITAL_COMMUNITY): Payer: Medicare Other

## 2014-06-20 ENCOUNTER — Encounter (HOSPITAL_COMMUNITY): Payer: Self-pay | Admitting: Emergency Medicine

## 2014-06-20 DIAGNOSIS — Z7982 Long term (current) use of aspirin: Secondary | ICD-10-CM | POA: Diagnosis not present

## 2014-06-20 DIAGNOSIS — Z888 Allergy status to other drugs, medicaments and biological substances status: Secondary | ICD-10-CM | POA: Diagnosis not present

## 2014-06-20 DIAGNOSIS — E538 Deficiency of other specified B group vitamins: Secondary | ICD-10-CM | POA: Diagnosis not present

## 2014-06-20 DIAGNOSIS — IMO0002 Reserved for concepts with insufficient information to code with codable children: Secondary | ICD-10-CM | POA: Diagnosis not present

## 2014-06-20 DIAGNOSIS — E785 Hyperlipidemia, unspecified: Secondary | ICD-10-CM | POA: Diagnosis present

## 2014-06-20 DIAGNOSIS — S42209A Unspecified fracture of upper end of unspecified humerus, initial encounter for closed fracture: Secondary | ICD-10-CM | POA: Diagnosis not present

## 2014-06-20 DIAGNOSIS — K219 Gastro-esophageal reflux disease without esophagitis: Secondary | ICD-10-CM | POA: Diagnosis present

## 2014-06-20 DIAGNOSIS — Z79899 Other long term (current) drug therapy: Secondary | ICD-10-CM | POA: Diagnosis not present

## 2014-06-20 DIAGNOSIS — X58XXXA Exposure to other specified factors, initial encounter: Secondary | ICD-10-CM | POA: Diagnosis present

## 2014-06-20 DIAGNOSIS — G8918 Other acute postprocedural pain: Secondary | ICD-10-CM | POA: Diagnosis not present

## 2014-06-20 DIAGNOSIS — J449 Chronic obstructive pulmonary disease, unspecified: Secondary | ICD-10-CM | POA: Diagnosis not present

## 2014-06-20 DIAGNOSIS — S42352A Displaced comminuted fracture of shaft of humerus, left arm, initial encounter for closed fracture: Secondary | ICD-10-CM

## 2014-06-20 DIAGNOSIS — F17200 Nicotine dependence, unspecified, uncomplicated: Secondary | ICD-10-CM | POA: Diagnosis present

## 2014-06-20 DIAGNOSIS — M79609 Pain in unspecified limb: Secondary | ICD-10-CM | POA: Diagnosis not present

## 2014-06-20 DIAGNOSIS — S42293A Other displaced fracture of upper end of unspecified humerus, initial encounter for closed fracture: Secondary | ICD-10-CM | POA: Diagnosis not present

## 2014-06-20 DIAGNOSIS — G40909 Epilepsy, unspecified, not intractable, without status epilepticus: Secondary | ICD-10-CM | POA: Diagnosis present

## 2014-06-20 DIAGNOSIS — Z9851 Tubal ligation status: Secondary | ICD-10-CM | POA: Diagnosis not present

## 2014-06-20 DIAGNOSIS — Z886 Allergy status to analgesic agent status: Secondary | ICD-10-CM | POA: Diagnosis not present

## 2014-06-20 DIAGNOSIS — S42309A Unspecified fracture of shaft of humerus, unspecified arm, initial encounter for closed fracture: Secondary | ICD-10-CM | POA: Diagnosis not present

## 2014-06-20 DIAGNOSIS — Z8249 Family history of ischemic heart disease and other diseases of the circulatory system: Secondary | ICD-10-CM | POA: Diagnosis not present

## 2014-06-20 DIAGNOSIS — S42209B Unspecified fracture of upper end of unspecified humerus, initial encounter for open fracture: Secondary | ICD-10-CM | POA: Diagnosis not present

## 2014-06-20 DIAGNOSIS — F3289 Other specified depressive episodes: Secondary | ICD-10-CM | POA: Diagnosis present

## 2014-06-20 DIAGNOSIS — R269 Unspecified abnormalities of gait and mobility: Secondary | ICD-10-CM | POA: Diagnosis present

## 2014-06-20 DIAGNOSIS — F411 Generalized anxiety disorder: Secondary | ICD-10-CM | POA: Diagnosis present

## 2014-06-20 DIAGNOSIS — G8929 Other chronic pain: Secondary | ICD-10-CM | POA: Diagnosis present

## 2014-06-20 DIAGNOSIS — Z01818 Encounter for other preprocedural examination: Secondary | ICD-10-CM | POA: Diagnosis not present

## 2014-06-20 DIAGNOSIS — J4489 Other specified chronic obstructive pulmonary disease: Secondary | ICD-10-CM | POA: Diagnosis not present

## 2014-06-20 DIAGNOSIS — E119 Type 2 diabetes mellitus without complications: Secondary | ICD-10-CM | POA: Diagnosis present

## 2014-06-20 DIAGNOSIS — Z9181 History of falling: Secondary | ICD-10-CM | POA: Diagnosis not present

## 2014-06-20 DIAGNOSIS — S42213A Unspecified displaced fracture of surgical neck of unspecified humerus, initial encounter for closed fracture: Secondary | ICD-10-CM | POA: Diagnosis present

## 2014-06-20 DIAGNOSIS — T148XXA Other injury of unspecified body region, initial encounter: Secondary | ICD-10-CM | POA: Diagnosis not present

## 2014-06-20 DIAGNOSIS — Z8673 Personal history of transient ischemic attack (TIA), and cerebral infarction without residual deficits: Secondary | ICD-10-CM | POA: Diagnosis not present

## 2014-06-20 HISTORY — DX: Unspecified convulsions: R56.9

## 2014-06-20 MED ORDER — FENTANYL CITRATE 0.05 MG/ML IJ SOLN
50.0000 ug | Freq: Once | INTRAMUSCULAR | Status: DC
  Filled 2014-06-20: qty 2

## 2014-06-20 MED ORDER — ONDANSETRON HCL 4 MG/2ML IJ SOLN
4.0000 mg | Freq: Once | INTRAMUSCULAR | Status: AC
  Administered 2014-06-21: 4 mg via INTRAVENOUS
  Filled 2014-06-20: qty 2

## 2014-06-20 NOTE — ED Notes (Signed)
IV attempt x1 to Right upper arm, unsuccessful

## 2014-06-20 NOTE — ED Notes (Signed)
Per EMS, patient was trying to walk from bathroom to bedroom where she slipped and fell. Patient reports to EMS that she has had a couple glasses of wine this evening and reports she has baseline difficulty with ambulation. IV attempt x2 unsuccessful with EMS, patient was given Fentanyl IM. Patient was ambulated by husband prior to EMS arrival, patient passed spinal cord assessment by EMS. Patient with bruising and noted deformity to left upper arm.

## 2014-06-20 NOTE — ED Notes (Signed)
Bed: GN56 Expected date: 06/20/14 Expected time: 10:47 PM Means of arrival: Ambulance Comments: Fall, possible humerus injury

## 2014-06-21 DIAGNOSIS — S42309A Unspecified fracture of shaft of humerus, unspecified arm, initial encounter for closed fracture: Secondary | ICD-10-CM | POA: Diagnosis not present

## 2014-06-21 MED ORDER — HYDROMORPHONE HCL 2 MG/ML IJ SOLN
2.0000 mg | Freq: Once | INTRAMUSCULAR | Status: AC
  Administered 2014-06-21: 2 mg via INTRAVENOUS
  Filled 2014-06-21: qty 1

## 2014-06-21 MED ORDER — HYDROMORPHONE HCL 1 MG/ML IJ SOLN
1.0000 mg | Freq: Once | INTRAMUSCULAR | Status: AC
  Administered 2014-06-21: 1 mg via INTRAVENOUS
  Filled 2014-06-21: qty 1

## 2014-06-21 NOTE — ED Notes (Signed)
Ortho tech states he will be here in 10-15 minutes

## 2014-06-21 NOTE — ED Notes (Signed)
ORTHO tech at bedside

## 2014-06-21 NOTE — ED Provider Notes (Signed)
CSN: [id number]     Arrival date & time 06/20/14  2322 History   First MD Initiated Contact with Patient 06/21/14 0016     Chief Complaint  Patient presents with  . 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 She also had been drinking alcohol earlier. She fell at home about 10:30 injury to her left upper arm. There is an obvious deformity of her left humerus. She is having severe pain in her left upper arm, worse with palpation or attempted movement. She has no numbness or weakness distal to the humerus and denies other injury. She is a chronic pain patient and regularly takes hydromorphone and morphine sulfate ER.  Past Medical History  Diagnosis Date  . Depression   . GERD (gastroesophageal reflux disease)   . Seizure disorder   . Gait disorder   . Anoxic brain injury   . Chronic neck pain   . Chronic pain in shoulder   . COPD (chronic obstructive pulmonary disease)   . LBP (low back pain)   . Osteoarthritis   . Type II or unspecified type diabetes mellitus without mention of complication, not stated as uncontrolled 2010  . Anxiety   . Vitamin B 12 deficiency 2011  . Hyperlipidemia   . Seizures    Past Surgical History  Procedure Laterality Date  . Pancreatectomy    . Hernia repair      incisional   Family History  Problem Relation Age of Onset  . Hypertension Other   . Heart disease Father 48    MI   History  Substance Use Topics  . Smoking status: Current Every Day Smoker -- 1.00 packs/day for 20 years    Types: Cigarettes  . Smokeless tobacco: Not on file  . Alcohol Use: 6.0 oz/week    10 Glasses of wine per week   OB History   Grav Para Term Preterm Abortions TAB SAB Ect Mult Living                 Review of Systems  All other systems reviewed and are  negative.  Allergies  Alendronate sodium; Asa; and Fluoxetine hcl  Home Medications   Prior to Admission medications   Medication Sig Start Date End Date Taking? Authorizing Provider  aspirin 81 MG EC tablet Take 81 mg by mouth daily.      Historical Provider, MD  carbidopa-levodopa (SINEMET IR) 25-100 MG per tablet Take 1 tablet by mouth 3 (three) times daily. 12/11/13   Tresa Garter, MD  Foot Care Products (DIABETIC INSOLES) MISC Please dispense the above along with 1 pair of diabetic shoes to Mrs. Keys Dx: 250.00 08/30/11   Tresa Garter, MD  FreeStyle Unistick II Lancets MISC daily.      Historical Provider, MD  Glucerna [facility]) LIQD Take 1 Can by mouth daily. 05/29/11   Aleksei Plotnikov V, MD  glucose blood (FREESTYLE LITE) test strip 1 each daily. Use as instructed     Historical Provider, MD  HYDROmorphone (DILAUDID) 2 MG tablet Take 4 mg by mouth every 4 (four) hours as needed.      Historical Provider, MD  l-methylfolate-B6-B12 (METANX) 3-35-2 MG TABS Take 1 tablet by mouth 2 (two) times daily. 01/09/13   Aleksei Plotnikov V, MD  LORazepam (ATIVAN) 2 MG tablet TAKE ONE-HALF TO ONE TABLET BY MOUTH  3 TIMES A DAY 06/08/14   Aleksei Plotnikov V, MD  metFORMIN (GLUCOPHAGE-XR) 500 MG 24 hr tablet TAKE 1 TABLET THREE TIMES A DAY 05/04/14   Aleksei Plotnikov V, MD  mirtazapine (REMERON) 15 MG tablet Take 1 tablet (15 mg total) by mouth at bedtime. Take at 6-8 pm 04/21/14 04/21/15  Aleksei Plotnikov V, MD  morphine (KADIAN) 60 MG 24 hr capsule Take 60 mg by mouth 2 (two) times daily.      Historical Provider, MD  mupirocin ointment (BACTROBAN) 2 % Apply topically 2 (two) times daily. 05/01/13   Aleksei Plotnikov V, MD  promethazine (PHENERGAN) 12.5 MG tablet Take 12.5 mg by mouth every 6 (six) hours as needed. 10/05/11 03/31/12  Aleksei Plotnikov V, MD  promethazine (PHENERGAN) 12.5 MG tablet TAKE 1 TABLET BY MOUTH EVERY 6 HOURS AS NEEDED FOR NAUSEA 10/26/13   Aleksei Plotnikov V, MD   promethazine (PHENERGAN) 12.5 MG tablet TAKE 1 TABLET BY MOUTH EVERY 6 HOURS AS NEEDED FOR NAUSEA 04/30/14   Aleksei Plotnikov V, MD  tolterodine (DETROL LA) 4 MG 24 hr capsule TAKE ONE CAPSULE AT BEDTIME    Aleksei Plotnikov V, MD  vitamin B-12 (CYANOCOBALAMIN) 500 MCG tablet Take 1 tablet (500 mcg total) by mouth daily. 10/05/11   Aleksei Plotnikov V, MD  Vitamin D, Ergocalciferol, (DRISDOL) 50000 UNITS CAPS capsule TAKE 1 CAPSULE BY MOUTH EVERY 30 (THIRTY) DAYS. 01/27/14   Aleksei Plotnikov V, MD  zolpidem (AMBIEN) 10 MG tablet TAKE 1 TABLET BY MOUTH AT BEDTIME AS NEEDED FOR SLEEP    Aleksei Plotnikov V, MD   BP 108/55  Pulse 92  Temp(Src) 98.9 F (37.2 C) (Oral)  Resp 20  SpO2 96%  Physical Exam General: Well-developed, well-nourished female in no acute distress; appearance consistent with age of record HENT: normocephalic; atraumatic Eyes: pupils equal, round and reactive to light; extraocular muscles intact Neck: supple Heart: regular rate and rhythm Lungs: clear to auscultation bilaterally Abdomen: soft; nondistended; nontender Extremities: Deformity of left upper arm with ecchymosis and tenderness; left upper extremity distally neurovascularly intact with intact tendon function Neurologic: Awake, alert and oriented; motor function intact in all extremities, mild left hemiparesis Skin: Warm and dry Psychiatric: Normal mood and affect    ED Course  Procedures (including critical care time)  MDM  Nursing notes and vitals signs, including pulse oximetry, reviewed.  Summary of this visit's results, reviewed by myself:  Labs:  No results found for this or any previous visit (from the past 24 hour(s)).  Imaging Studies: Dg Humerus Left  06/21/2014   CLINICAL DATA:  Fall with deformity of humerus  EXAM: LEFT HUMERUS - 2+ VIEW  COMPARISON:  None.  FINDINGS: There is a spiral fracture of the proximal shaft of the humerus extending from the humeral neck into the mid humerus.  Fractures comminuted with 3 components. Distal fragment is displaced approximately 7 mm medially.  IMPRESSION: Mildly to moderately displaced fracture of the proximal humeral shaft.   Electronically Signed   By: Esperanza Heir M.D.   On: 06/21/2014 00:38   1:29 AM Patient placed in coaptation splint. RUE remains neurovascularly intact. We will refer to Dr. Lajoyce Corners. Patient has transfer chair at home as well as daily CNA assistance. Her husband will be with her tonight and throughout the day as well.        Hanley Seamen, MD 06/21/14 [phone]

## 2014-06-21 NOTE — ED Notes (Signed)
Light Green & Lavender save tubes sent to lab at this time.

## 2014-06-21 NOTE — ED Notes (Signed)
Patient transported to X-ray 

## 2014-06-22 DIAGNOSIS — S42209A Unspecified fracture of upper end of unspecified humerus, initial encounter for closed fracture: Secondary | ICD-10-CM | POA: Diagnosis not present

## 2014-06-23 ENCOUNTER — Encounter (HOSPITAL_COMMUNITY): Payer: Self-pay | Admitting: Pharmacy Technician

## 2014-06-23 ENCOUNTER — Encounter (HOSPITAL_COMMUNITY): Payer: Self-pay | Admitting: *Deleted

## 2014-06-23 DIAGNOSIS — S42209A Unspecified fracture of upper end of unspecified humerus, initial encounter for closed fracture: Secondary | ICD-10-CM | POA: Diagnosis not present

## 2014-06-24 ENCOUNTER — Ambulatory Visit (HOSPITAL_COMMUNITY): Payer: Medicare Other | Admitting: Anesthesiology

## 2014-06-24 ENCOUNTER — Encounter (HOSPITAL_COMMUNITY): Payer: Medicare Other | Admitting: Anesthesiology

## 2014-06-24 ENCOUNTER — Ambulatory Visit (HOSPITAL_COMMUNITY): Payer: Medicare Other

## 2014-06-24 ENCOUNTER — Encounter (HOSPITAL_COMMUNITY)
Admission: RE | Disposition: A | Discharge status: Home / Self Care | Payer: Self-pay | Source: Ambulatory Visit | Attending: Orthopedic Surgery

## 2014-06-24 ENCOUNTER — Encounter (HOSPITAL_COMMUNITY): Payer: Self-pay | Admitting: *Deleted

## 2014-06-24 ENCOUNTER — Inpatient Hospital Stay (HOSPITAL_COMMUNITY)
Admission: RE | Admit: 2014-06-24 | Discharge: 2014-06-25 | DRG: 494 | Disposition: A | Discharge status: Home / Self Care | Payer: Medicare Other | Source: Ambulatory Visit | Attending: Orthopedic Surgery | Admitting: Orthopedic Surgery

## 2014-06-24 ENCOUNTER — Inpatient Hospital Stay (HOSPITAL_COMMUNITY): Payer: Medicare Other

## 2014-06-24 DIAGNOSIS — E119 Type 2 diabetes mellitus without complications: Secondary | ICD-10-CM | POA: Diagnosis present

## 2014-06-24 DIAGNOSIS — F17200 Nicotine dependence, unspecified, uncomplicated: Secondary | ICD-10-CM | POA: Diagnosis present

## 2014-06-24 DIAGNOSIS — G40909 Epilepsy, unspecified, not intractable, without status epilepticus: Secondary | ICD-10-CM | POA: Diagnosis present

## 2014-06-24 DIAGNOSIS — S42209B Unspecified fracture of upper end of unspecified humerus, initial encounter for open fracture: Secondary | ICD-10-CM | POA: Diagnosis not present

## 2014-06-24 DIAGNOSIS — E538 Deficiency of other specified B group vitamins: Secondary | ICD-10-CM | POA: Diagnosis not present

## 2014-06-24 DIAGNOSIS — J449 Chronic obstructive pulmonary disease, unspecified: Secondary | ICD-10-CM | POA: Diagnosis not present

## 2014-06-24 DIAGNOSIS — Z9181 History of falling: Secondary | ICD-10-CM

## 2014-06-24 DIAGNOSIS — Z886 Allergy status to analgesic agent status: Secondary | ICD-10-CM

## 2014-06-24 DIAGNOSIS — Z8673 Personal history of transient ischemic attack (TIA), and cerebral infarction without residual deficits: Secondary | ICD-10-CM

## 2014-06-24 DIAGNOSIS — Z9851 Tubal ligation status: Secondary | ICD-10-CM

## 2014-06-24 DIAGNOSIS — Z8249 Family history of ischemic heart disease and other diseases of the circulatory system: Secondary | ICD-10-CM

## 2014-06-24 DIAGNOSIS — Z888 Allergy status to other drugs, medicaments and biological substances status: Secondary | ICD-10-CM | POA: Diagnosis not present

## 2014-06-24 DIAGNOSIS — G8918 Other acute postprocedural pain: Secondary | ICD-10-CM | POA: Diagnosis not present

## 2014-06-24 DIAGNOSIS — E785 Hyperlipidemia, unspecified: Secondary | ICD-10-CM | POA: Diagnosis present

## 2014-06-24 DIAGNOSIS — R269 Unspecified abnormalities of gait and mobility: Secondary | ICD-10-CM | POA: Diagnosis present

## 2014-06-24 DIAGNOSIS — S42309A Unspecified fracture of shaft of humerus, unspecified arm, initial encounter for closed fracture: Secondary | ICD-10-CM | POA: Diagnosis present

## 2014-06-24 DIAGNOSIS — S42302A Unspecified fracture of shaft of humerus, left arm, initial encounter for closed fracture: Secondary | ICD-10-CM | POA: Diagnosis present

## 2014-06-24 DIAGNOSIS — S42213A Unspecified displaced fracture of surgical neck of unspecified humerus, initial encounter for closed fracture: Principal | ICD-10-CM | POA: Diagnosis present

## 2014-06-24 DIAGNOSIS — X58XXXA Exposure to other specified factors, initial encounter: Secondary | ICD-10-CM | POA: Diagnosis present

## 2014-06-24 DIAGNOSIS — Z01818 Encounter for other preprocedural examination: Secondary | ICD-10-CM | POA: Diagnosis not present

## 2014-06-24 DIAGNOSIS — F411 Generalized anxiety disorder: Secondary | ICD-10-CM | POA: Diagnosis present

## 2014-06-24 DIAGNOSIS — F3289 Other specified depressive episodes: Secondary | ICD-10-CM | POA: Diagnosis present

## 2014-06-24 DIAGNOSIS — K219 Gastro-esophageal reflux disease without esophagitis: Secondary | ICD-10-CM | POA: Diagnosis present

## 2014-06-24 DIAGNOSIS — G8929 Other chronic pain: Secondary | ICD-10-CM | POA: Diagnosis present

## 2014-06-24 DIAGNOSIS — J4489 Other specified chronic obstructive pulmonary disease: Secondary | ICD-10-CM | POA: Diagnosis present

## 2014-06-24 DIAGNOSIS — Z79899 Other long term (current) drug therapy: Secondary | ICD-10-CM

## 2014-06-24 DIAGNOSIS — IMO0002 Reserved for concepts with insufficient information to code with codable children: Secondary | ICD-10-CM | POA: Diagnosis not present

## 2014-06-24 DIAGNOSIS — S42209A Unspecified fracture of upper end of unspecified humerus, initial encounter for closed fracture: Secondary | ICD-10-CM | POA: Diagnosis not present

## 2014-06-24 DIAGNOSIS — F329 Major depressive disorder, single episode, unspecified: Secondary | ICD-10-CM | POA: Diagnosis present

## 2014-06-24 DIAGNOSIS — Z7982 Long term (current) use of aspirin: Secondary | ICD-10-CM

## 2014-06-24 HISTORY — DX: Unspecified fracture of shaft of humerus, left arm, initial encounter for closed fracture: S42.302A

## 2014-06-24 HISTORY — DX: Polyneuropathy, unspecified: G62.9

## 2014-06-24 HISTORY — DX: Personal history of transient ischemic attack (TIA), and cerebral infarction without residual deficits: Z86.73

## 2014-06-24 HISTORY — PX: ORIF HUMERUS FRACTURE: SHX2126

## 2014-06-24 HISTORY — DX: Unspecified abdominal hernia without obstruction or gangrene: K46.9

## 2014-06-24 LAB — GLUCOSE, CAPILLARY
Glucose-Capillary: 189 mg/dL — ABNORMAL HIGH (ref 70–99)
Glucose-Capillary: 161 mg/dL — ABNORMAL HIGH (ref 70–99)
Glucose-Capillary: 190 mg/dL — ABNORMAL HIGH (ref 70–99)
Glucose-Capillary: 227 mg/dL — ABNORMAL HIGH (ref 70–99)
Glucose-Capillary: 160 mg/dL — ABNORMAL HIGH (ref 70–99)

## 2014-06-24 LAB — COMPREHENSIVE METABOLIC PANEL
ALT: <5 U/L (ref 0–35)
AST: 23 U/L (ref 0–37)
Albumin: 3.5 g/dL (ref 3.5–5.2)
Alkaline Phosphatase: 71 U/L (ref 39–117)
Anion gap: 11 (ref 5–15)
BUN: 6 mg/dL (ref 6–23)
CO2: 26 mEq/L (ref 19–32)
Calcium: 8.9 mg/dL (ref 8.4–10.5)
Chloride: 100 mEq/L (ref 96–112)
Creatinine, Ser: 0.37 mg/dL — ABNORMAL LOW (ref 0.50–1.10)
GFR calc Af Amer: >90 mL/min (ref >90)
GFR calc non Af Amer: >90 mL/min (ref >90)
Glucose, Bld: 176 mg/dL — ABNORMAL HIGH (ref 70–99)
Potassium: 4.2 mEq/L (ref 3.7–5.3)
Sodium: 137 mEq/L (ref 137–147)
Total Bilirubin: 0.6 mg/dL (ref 0.3–1.2)
Total Protein: 6.4 g/dL (ref 6.0–8.3)

## 2014-06-24 LAB — CBC
HCT: 40 % (ref 36.0–46.0)
Hemoglobin: 13.2 g/dL (ref 12.0–15.0)
MCH: 30.7 pg (ref 26.0–34.0)
MCHC: 33 g/dL (ref 30.0–36.0)
MCV: 93 fL (ref 78.0–100.0)
Platelets: 213 10*3/uL (ref 150–400)
RBC: 4.3 MIL/uL (ref 3.87–5.11)
RDW: 14.7 % (ref 11.5–15.5)
WBC: 9.5 10*3/uL (ref 4.0–10.5)

## 2014-06-24 SURGERY — OPEN REDUCTION INTERNAL FIXATION (ORIF) PROXIMAL HUMERUS FRACTURE
Anesthesia: General | Site: Shoulder | Laterality: Left

## 2014-06-24 MED ORDER — 0.9 % SODIUM CHLORIDE (POUR BTL) OPTIME
TOPICAL | Status: DC | PRN
  Administered 2014-06-24: 1000 mL

## 2014-06-24 MED ORDER — ROCURONIUM BROMIDE 100 MG/10ML IV SOLN
INTRAVENOUS | Status: DC | PRN
  Administered 2014-06-24 (×2): 10 mg via INTRAVENOUS
  Administered 2014-06-24: 30 mg via INTRAVENOUS

## 2014-06-24 MED ORDER — CARBIDOPA-LEVODOPA 25-100 MG PO TABS
1.0000 | ORAL_TABLET | Freq: Three times a day (TID) | ORAL | Status: DC
  Administered 2014-06-24 – 2014-06-25 (×2): 1 via ORAL
  Filled 2014-06-24 (×4): qty 1

## 2014-06-24 MED ORDER — LIDOCAINE HCL (CARDIAC) 20 MG/ML IV SOLN
INTRAVENOUS | Status: DC | PRN
  Administered 2014-06-24: 20 mg via INTRAVENOUS

## 2014-06-24 MED ORDER — ACETAMINOPHEN 325 MG PO TABS: 650.0000 mg | ORAL_TABLET | Freq: Four times a day (QID) | ORAL | Status: DC | PRN

## 2014-06-24 MED ORDER — METHOCARBAMOL 1000 MG/10ML IJ SOLN
500.0000 mg | Freq: Four times a day (QID) | INTRAVENOUS | Status: DC | PRN
  Filled 2014-06-24: qty 5

## 2014-06-24 MED ORDER — DOCUSATE SODIUM 100 MG PO CAPS
100.0000 mg | ORAL_CAPSULE | Freq: Two times a day (BID) | ORAL | Status: DC
  Administered 2014-06-25: 100 mg via ORAL
  Filled 2014-06-24 (×2): qty 1

## 2014-06-24 MED ORDER — GLYCOPYRROLATE 0.2 MG/ML IJ SOLN
INTRAMUSCULAR | Status: DC | PRN
  Administered 2014-06-24: 0.4 mg via INTRAVENOUS

## 2014-06-24 MED ORDER — ACETAMINOPHEN 650 MG RE SUPP: 650.0000 mg | Freq: Four times a day (QID) | RECTAL | Status: DC | PRN

## 2014-06-24 MED ORDER — LACTATED RINGERS IV SOLN
INTRAVENOUS | Status: DC
  Administered 2014-06-24: 11:00:00 via INTRAVENOUS

## 2014-06-24 MED ORDER — HYDROMORPHONE HCL 2 MG PO TABS
2.0000 mg | ORAL_TABLET | Freq: Four times a day (QID) | ORAL | Status: DC | PRN
  Administered 2014-06-25: 2 mg via ORAL
  Administered 2014-06-25: 4 mg via ORAL
  Filled 2014-06-24: qty 2
  Filled 2014-06-24: qty 1

## 2014-06-24 MED ORDER — BUPIVACAINE-EPINEPHRINE (PF) 0.25% -1:200000 IJ SOLN
INTRAMUSCULAR | Status: AC
  Filled 2014-06-24: qty 30

## 2014-06-24 MED ORDER — POLYETHYLENE GLYCOL 3350 17 G PO PACK: 17.0000 g | PACK | Freq: Every day | ORAL | Status: DC | PRN

## 2014-06-24 MED ORDER — ARTIFICIAL TEARS OP OINT
TOPICAL_OINTMENT | OPHTHALMIC | Status: DC | PRN
  Administered 2014-06-24: 1 via OPHTHALMIC

## 2014-06-24 MED ORDER — PROPOFOL 10 MG/ML IV BOLUS
INTRAVENOUS | Status: DC | PRN
  Administered 2014-06-24: 200 mg via INTRAVENOUS

## 2014-06-24 MED ORDER — KETAMINE HCL 100 MG/ML IJ SOLN
INTRAMUSCULAR | Status: DC | PRN
  Administered 2014-06-24: 30 mg via INTRAVENOUS

## 2014-06-24 MED ORDER — FENTANYL CITRATE 0.05 MG/ML IJ SOLN
INTRAMUSCULAR | Status: AC
  Filled 2014-06-24: qty 5

## 2014-06-24 MED ORDER — METOCLOPRAMIDE HCL 5 MG PO TABS
5.0000 mg | ORAL_TABLET | Freq: Three times a day (TID) | ORAL | Status: DC | PRN
  Filled 2014-06-24: qty 2

## 2014-06-24 MED ORDER — MAGNESIUM CITRATE PO SOLN
1.0000 | Freq: Once | ORAL | Status: AC | PRN
Start: 2014-06-24 — End: 2014-06-24

## 2014-06-24 MED ORDER — MENTHOL 3 MG MT LOZG: 1.0000 | LOZENGE | OROMUCOSAL | Status: DC | PRN

## 2014-06-24 MED ORDER — HYDROMORPHONE HCL 1 MG/ML IJ SOLN
0.2500 mg | INTRAMUSCULAR | Status: DC | PRN
  Administered 2014-06-24 (×2): 0.5 mg via INTRAVENOUS

## 2014-06-24 MED ORDER — METFORMIN HCL ER 500 MG PO TB24
500.0000 mg | ORAL_TABLET | Freq: Three times a day (TID) | ORAL | Status: DC
  Administered 2014-06-25 (×2): 500 mg via ORAL
  Filled 2014-06-24 (×4): qty 1

## 2014-06-24 MED ORDER — MIDAZOLAM HCL 5 MG/5ML IJ SOLN
INTRAMUSCULAR | Status: DC | PRN
  Administered 2014-06-24: 2 mg via INTRAVENOUS

## 2014-06-24 MED ORDER — ONDANSETRON HCL 4 MG/2ML IJ SOLN: 4.0000 mg | Freq: Four times a day (QID) | INTRAMUSCULAR | Status: DC | PRN

## 2014-06-24 MED ORDER — OXYCODONE HCL 10 MG PO TABS: 10.0000 mg | ORAL_TABLET | Freq: Four times a day (QID) | ORAL | Status: DC | PRN

## 2014-06-24 MED ORDER — ONDANSETRON HCL 4 MG PO TABS: 4.0000 mg | ORAL_TABLET | Freq: Four times a day (QID) | ORAL | Status: DC | PRN

## 2014-06-24 MED ORDER — SENNA 8.6 MG PO TABS
1.0000 | ORAL_TABLET | Freq: Two times a day (BID) | ORAL | Status: DC
  Administered 2014-06-24: 8.6 mg via ORAL
  Filled 2014-06-24 (×3): qty 1

## 2014-06-24 MED ORDER — OXYCODONE-ACETAMINOPHEN 10-325 MG PO TABS: 1.0000 | ORAL_TABLET | Freq: Four times a day (QID) | ORAL | Status: DC | PRN

## 2014-06-24 MED ORDER — POTASSIUM CHLORIDE IN NACL 20-0.45 MEQ/L-% IV SOLN
INTRAVENOUS | Status: DC
  Administered 2014-06-25: 06:00:00 via INTRAVENOUS
  Filled 2014-06-24 (×3): qty 1000

## 2014-06-24 MED ORDER — BACLOFEN 10 MG PO TABS: 10.0000 mg | ORAL_TABLET | Freq: Three times a day (TID) | ORAL | Status: DC

## 2014-06-24 MED ORDER — DIAZEPAM 5 MG PO TABS: 5.0000 mg | ORAL_TABLET | Freq: Four times a day (QID) | ORAL | Status: DC | PRN

## 2014-06-24 MED ORDER — HYDROMORPHONE HCL 1 MG/ML IJ SOLN
2.0000 mg | INTRAMUSCULAR | Status: DC | PRN
  Administered 2014-06-24 – 2014-06-25 (×2): 2 mg via INTRAVENOUS
  Filled 2014-06-24 (×2): qty 2

## 2014-06-24 MED ORDER — ASPIRIN EC 81 MG PO TBEC
81.0000 mg | DELAYED_RELEASE_TABLET | Freq: Every day | ORAL | Status: DC
  Administered 2014-06-25: 81 mg via ORAL
  Filled 2014-06-24 (×2): qty 1

## 2014-06-24 MED ORDER — ZOLPIDEM TARTRATE 5 MG PO TABS: 5.0000 mg | ORAL_TABLET | Freq: Every evening | ORAL | Status: DC | PRN

## 2014-06-24 MED ORDER — OXYCODONE-ACETAMINOPHEN 5-325 MG PO TABS
1.0000 | ORAL_TABLET | ORAL | Status: DC | PRN
  Administered 2014-06-25: 2 via ORAL
  Filled 2014-06-24: qty 2

## 2014-06-24 MED ORDER — PHENYLEPHRINE HCL 10 MG/ML IJ SOLN
INTRAMUSCULAR | Status: DC | PRN
  Administered 2014-06-24 (×2): 80 ug via INTRAVENOUS

## 2014-06-24 MED ORDER — DEXAMETHASONE SODIUM PHOSPHATE 4 MG/ML IJ SOLN
INTRAMUSCULAR | Status: DC | PRN
  Administered 2014-06-24: 4 mg via INTRAVENOUS

## 2014-06-24 MED ORDER — METOCLOPRAMIDE HCL 5 MG/ML IJ SOLN: 5.0000 mg | Freq: Three times a day (TID) | INTRAMUSCULAR | Status: DC | PRN

## 2014-06-24 MED ORDER — BUPIVACAINE-EPINEPHRINE (PF) 0.5% -1:200000 IJ SOLN
INTRAMUSCULAR | Status: DC | PRN
Start: 2014-06-24 — End: 2014-06-25
  Administered 2014-06-24: 30 mL via PERINEURAL

## 2014-06-24 MED ORDER — NEOSTIGMINE METHYLSULFATE 10 MG/10ML IV SOLN
INTRAVENOUS | Status: DC | PRN
Start: 2014-06-24 — End: 2014-06-24
  Administered 2014-06-24: 3 mg via INTRAVENOUS

## 2014-06-24 MED ORDER — BISACODYL 10 MG RE SUPP: 10.0000 mg | Freq: Every day | RECTAL | Status: DC | PRN

## 2014-06-24 MED ORDER — KETAMINE HCL 100 MG/ML IJ SOLN
INTRAMUSCULAR | Status: AC
  Filled 2014-06-24: qty 1

## 2014-06-24 MED ORDER — CEFAZOLIN SODIUM 1-5 GM-% IV SOLN
1.0000 g | Freq: Four times a day (QID) | INTRAVENOUS | Status: AC
  Administered 2014-06-24 – 2014-06-25 (×2): 1 g via INTRAVENOUS
  Filled 2014-06-24 (×5): qty 50

## 2014-06-24 MED ORDER — DIPHENHYDRAMINE HCL 12.5 MG/5ML PO ELIX: 12.5000 mg | ORAL_SOLUTION | ORAL | Status: DC | PRN

## 2014-06-24 MED ORDER — PHENOL 1.4 % MT LIQD: 1.0000 | OROMUCOSAL | Status: DC | PRN

## 2014-06-24 MED ORDER — OXYCODONE HCL 5 MG PO TABS: 5.0000 mg | ORAL_TABLET | Freq: Once | ORAL | Status: DC | PRN

## 2014-06-24 MED ORDER — METHOCARBAMOL 500 MG PO TABS
500.0000 mg | ORAL_TABLET | Freq: Four times a day (QID) | ORAL | Status: DC | PRN
  Administered 2014-06-25: 500 mg via ORAL
  Filled 2014-06-24: qty 1

## 2014-06-24 MED ORDER — OXYCODONE HCL 5 MG/5ML PO SOLN: 5.0000 mg | Freq: Once | ORAL | Status: DC | PRN

## 2014-06-24 MED ORDER — PHENYLEPHRINE HCL 10 MG/ML IJ SOLN
10.0000 mg | INTRAVENOUS | Status: DC | PRN
  Administered 2014-06-24: 50 ug/min via INTRAVENOUS

## 2014-06-24 MED ORDER — OXYCODONE HCL 5 MG PO TABS: 5.0000 mg | ORAL_TABLET | ORAL | Status: DC | PRN

## 2014-06-24 MED ORDER — MIDAZOLAM HCL 2 MG/2ML IJ SOLN
INTRAMUSCULAR | Status: AC
  Filled 2014-06-24: qty 2

## 2014-06-24 MED ORDER — BUPIVACAINE-EPINEPHRINE 0.25% -1:200000 IJ SOLN
INTRAMUSCULAR | Status: DC | PRN
  Administered 2014-06-24: 30 mL

## 2014-06-24 MED ORDER — HYDROMORPHONE HCL 1 MG/ML IJ SOLN
INTRAMUSCULAR | Status: AC
  Filled 2014-06-24: qty 1

## 2014-06-24 MED ORDER — LORAZEPAM 1 MG PO TABS
1.0000 mg | ORAL_TABLET | Freq: Four times a day (QID) | ORAL | Status: DC | PRN
  Administered 2014-06-24 – 2014-06-25 (×2): 1 mg via ORAL
  Filled 2014-06-24 (×2): qty 1

## 2014-06-24 MED ORDER — MIRTAZAPINE 15 MG PO TABS
15.0000 mg | ORAL_TABLET | Freq: Every day | ORAL | Status: DC
  Administered 2014-06-24: 15 mg via ORAL
  Filled 2014-06-24 (×2): qty 1

## 2014-06-24 MED ORDER — MORPHINE SULFATE ER 15 MG PO TBCR
15.0000 mg | EXTENDED_RELEASE_TABLET | Freq: Two times a day (BID) | ORAL | Status: DC
  Administered 2014-06-24 – 2014-06-25 (×2): 15 mg via ORAL
  Filled 2014-06-24 (×2): qty 1

## 2014-06-24 MED ORDER — FESOTERODINE FUMARATE ER 8 MG PO TB24
8.0000 mg | ORAL_TABLET | Freq: Every day | ORAL | Status: DC
  Filled 2014-06-24 (×2): qty 1

## 2014-06-24 MED ORDER — PROPOFOL 10 MG/ML IV BOLUS
INTRAVENOUS | Status: AC
  Filled 2014-06-24: qty 20

## 2014-06-24 MED ORDER — VITAMIN D3 25 MCG (1000 UNIT) PO TABS
1000.0000 [IU] | ORAL_TABLET | Freq: Every day | ORAL | Status: DC
  Administered 2014-06-25: 1000 [IU] via ORAL
  Filled 2014-06-24: qty 1

## 2014-06-24 MED ORDER — ONDANSETRON HCL 4 MG/2ML IJ SOLN
INTRAMUSCULAR | Status: DC | PRN
  Administered 2014-06-24: 4 mg via INTRAVENOUS

## 2014-06-24 MED ORDER — L-METHYLFOLATE-B6-B12 3-35-2 MG PO TABS
1.0000 | ORAL_TABLET | Freq: Two times a day (BID) | ORAL | Status: DC
  Administered 2014-06-24 – 2014-06-25 (×2): 1 via ORAL
  Filled 2014-06-24 (×3): qty 1

## 2014-06-24 MED ORDER — PROMETHAZINE HCL 25 MG/ML IJ SOLN: 6.2500 mg | INTRAMUSCULAR | Status: DC | PRN

## 2014-06-24 MED ORDER — ALUM & MAG HYDROXIDE-SIMETH 200-200-20 MG/5ML PO SUSP: 30.0000 mL | ORAL | Status: DC | PRN

## 2014-06-24 MED ORDER — SENNA-DOCUSATE SODIUM 8.6-50 MG PO TABS: 2.0000 | ORAL_TABLET | Freq: Every day | ORAL | Status: DC

## 2014-06-24 MED ORDER — CEFAZOLIN SODIUM-DEXTROSE 2-3 GM-% IV SOLR
2.0000 g | INTRAVENOUS | Status: AC
  Administered 2014-06-24: 2 g via INTRAVENOUS

## 2014-06-24 MED ORDER — DIPHENHYDRAMINE HCL 50 MG/ML IJ SOLN
INTRAMUSCULAR | Status: DC | PRN
  Administered 2014-06-24: 12.5 mg via INTRAVENOUS

## 2014-06-24 MED ORDER — HYDROMORPHONE HCL 2 MG/ML IJ SOLN: 2.0000 mg | INTRAMUSCULAR | Status: DC | PRN

## 2014-06-24 MED ORDER — FENTANYL CITRATE 0.05 MG/ML IJ SOLN
INTRAMUSCULAR | Status: DC | PRN
  Administered 2014-06-24: 50 ug via INTRAVENOUS
  Administered 2014-06-24: 100 ug via INTRAVENOUS

## 2014-06-24 MED ORDER — DEXMEDETOMIDINE HCL IN NACL 200 MCG/50ML IV SOLN
INTRAVENOUS | Status: DC | PRN
  Administered 2014-06-24: 0.4 ug/kg/h via INTRAVENOUS

## 2014-06-24 MED ORDER — HYDROMORPHONE HCL 2 MG PO TABS: 4.0000 mg | ORAL_TABLET | Freq: Four times a day (QID) | ORAL | Status: DC | PRN

## 2014-06-24 SURGICAL SUPPLY — 79 items
APL SKNCLS STERI-STRIP NONHPOA (GAUZE/BANDAGES/DRESSINGS) ×1
BENZOIN TINCTURE PRP APPL 2/3 (GAUZE/BANDAGES/DRESSINGS) ×2 IMPLANT
BIT DRILL 4 LONG FAST STEP (BIT) ×2 IMPLANT
BIT DRILL 4 SHORT FAST STEP (BIT) ×2 IMPLANT
CLEANER TIP ELECTROSURG 2X2 (MISCELLANEOUS) IMPLANT
CLSR STERI-STRIP ANTIMIC 1/2X4 (GAUZE/BANDAGES/DRESSINGS) ×2 IMPLANT
COVER SURGICAL LIGHT HANDLE (MISCELLANEOUS) ×2 IMPLANT
DRAPE C-ARM 42X72 X-RAY (DRAPES) ×2 IMPLANT
DRAPE INCISE IOBAN 66X45 STRL (DRAPES) ×2 IMPLANT
DRAPE SURG 17X23 STRL (DRAPES) ×2 IMPLANT
DRAPE U-SHAPE 47X51 STRL (DRAPES) ×2 IMPLANT
DRILL BIT 2.8MM DB28 (MISCELLANEOUS) ×2 IMPLANT
DRSG AQUACEL AG ADV 3.5X10 (GAUZE/BANDAGES/DRESSINGS) IMPLANT
DRSG AQUACEL AG ADV 3.5X14 (GAUZE/BANDAGES/DRESSINGS) ×2 IMPLANT
DRSG MEPILEX BORDER 4X8 (GAUZE/BANDAGES/DRESSINGS) ×2 IMPLANT
DURAPREP 26ML APPLICATOR (WOUND CARE) ×2 IMPLANT
ELECT REM PT RETURN 9FT ADLT (ELECTROSURGICAL)
ELECTRODE REM PT RTRN 9FT ADLT (ELECTROSURGICAL) IMPLANT
GLOVE BIO SURGEON STRL SZ7 (GLOVE) ×4 IMPLANT
GLOVE BIOGEL PI IND STRL 6.5 (GLOVE) ×3 IMPLANT
GLOVE BIOGEL PI IND STRL 7.0 (GLOVE) ×3 IMPLANT
GLOVE BIOGEL PI INDICATOR 6.5 (GLOVE) ×3
GLOVE BIOGEL PI INDICATOR 7.0 (GLOVE) ×3
GLOVE BIOGEL PI ORTHO PRO SZ8 (GLOVE) ×1
GLOVE ECLIPSE 6.5 STRL STRAW (GLOVE) ×2 IMPLANT
GLOVE ORTHO TXT STRL SZ7.5 (GLOVE) ×4 IMPLANT
GLOVE PI ORTHO PRO STRL SZ8 (GLOVE) ×1 IMPLANT
GLOVE SURG ORTHO 8.0 STRL STRW (GLOVE) ×4 IMPLANT
GLOVE SURG SS PI 6.5 STRL IVOR (GLOVE) ×4 IMPLANT
GOWN STRL REUS W/ TWL LRG LVL3 (GOWN DISPOSABLE) ×6 IMPLANT
GOWN STRL REUS W/ TWL XL LVL3 (GOWN DISPOSABLE) ×1 IMPLANT
GOWN STRL REUS W/TWL LRG LVL3 (GOWN DISPOSABLE) ×12
GOWN STRL REUS W/TWL XL LVL3 (GOWN DISPOSABLE) ×2
KIT BASIN OR (CUSTOM PROCEDURE TRAY) ×2 IMPLANT
KIT ROOM TURNOVER OR (KITS) ×2 IMPLANT
MANIFOLD NEPTUNE II (INSTRUMENTS) ×2 IMPLANT
NEEDLE HYPO 25GX1X1/2 BEV (NEEDLE) IMPLANT
NS IRRIG 1000ML POUR BTL (IV SOLUTION) ×2 IMPLANT
PACK SHOULDER (CUSTOM PROCEDURE TRAY) ×2 IMPLANT
PAD ARMBOARD 7.5X6 YLW CONV (MISCELLANEOUS) ×4 IMPLANT
PEG STND 4.0X35MM (Orthopedic Implant) ×2 IMPLANT
PEG STND 4.0X37.5MM (Orthopedic Implant) ×2 IMPLANT
PEG STND 4.0X40MM (Orthopedic Implant) ×2 IMPLANT
PEG STND 4.0X42.5MM (Orthopedic Implant) ×2 IMPLANT
PEG STND 4.0X45.0MM (Orthopedic Implant) ×2 IMPLANT
PEGSTD 4.0X35MM (Orthopedic Implant) ×1 IMPLANT
PEGSTD 4.0X37.5MM (Orthopedic Implant) ×1 IMPLANT
PEGSTD 4.0X40MM (Orthopedic Implant) ×1 IMPLANT
PEGSTD 4.0X42.5MM (Orthopedic Implant) ×1 IMPLANT
PEGSTD 4.0X45.0MM (Orthopedic Implant) ×1 IMPLANT
PIN GUIDE SHOULDER 2.0MM (PIN) ×4 IMPLANT
PLATE SHOULDER S3 11HOLE LT (Plate) ×2 IMPLANT
SCREW LOCK 90D ANGLED 3.8X22 (Screw) ×6 IMPLANT
SCREW LOCK 90D ANGLED 3.8X26 (Screw) ×2 IMPLANT
SCREW MULTIDIR 3.8X26 HUMRL (Screw) ×2 IMPLANT
SCREW MULTIDIR 3.8X30 HUMRL (Screw) ×2 IMPLANT
SCREW MULTIDIR 3.8X32 HUMRL (Screw) ×2 IMPLANT
SCREW MULTIDIR 3.8X34 HUMRL (Screw) ×2 IMPLANT
SCREW MULTIDIR SS 3.8X28 HUMRL (Screw) ×4 IMPLANT
SPONGE LAP 18X18 X RAY DECT (DISPOSABLE) ×2 IMPLANT
SPONGE LAP 4X18 X RAY DECT (DISPOSABLE) IMPLANT
STAPLER VISISTAT 35W (STAPLE) IMPLANT
SUCTION FRAZIER TIP 10 FR DISP (SUCTIONS) ×2 IMPLANT
SUPPORT WRAP ARM LG (MISCELLANEOUS) ×2 IMPLANT
SUT FIBERWIRE #2 38 T-5 BLUE (SUTURE)
SUT MNCRL AB 4-0 PS2 18 (SUTURE) ×2 IMPLANT
SUT VIC AB 0 CT1 27 (SUTURE) ×4
SUT VIC AB 0 CT1 27XBRD ANBCTR (SUTURE) ×2 IMPLANT
SUT VIC AB 0 CTB1 27 (SUTURE) IMPLANT
SUT VIC AB 2-0 CT1 27 (SUTURE) ×2
SUT VIC AB 2-0 CT1 TAPERPNT 27 (SUTURE) ×1 IMPLANT
SUT VIC AB 3-0 FS2 27 (SUTURE) ×2 IMPLANT
SUT VIC AB 3-0 SH 18 (SUTURE) ×2 IMPLANT
SUTURE FIBERWR #2 38 T-5 BLUE (SUTURE) IMPLANT
SYR BULB IRRIGATION 50ML (SYRINGE) ×2 IMPLANT
SYR CONTROL 10ML LL (SYRINGE) IMPLANT
TOWEL OR 17X24 6PK STRL BLUE (TOWEL DISPOSABLE) ×2 IMPLANT
TOWEL OR 17X26 10 PK STRL BLUE (TOWEL DISPOSABLE) ×2 IMPLANT
WATER STERILE IRR 1000ML POUR (IV SOLUTION) ×2 IMPLANT

## 2014-06-24 NOTE — H&P (Signed)
PREOPERATIVE H&P  Chief Complaint: left proximal humerus fracture  HPI: [name] is a 63 y.o. female who presents for preoperative history and physical with a diagnosis of left proximal humerus fracture. Symptoms are rated as moderate to severe, and have been worsening.  This is significantly impairing activities of daily living.  She has elected for surgical management. She uses her upper time in for ambulation, in order to help support or self because she has chronic severe balance issues with multiple falls. This injury happened this past Sunday. She is on chronic narcotic medications even prior to the fracture.  Past Medical History  Diagnosis Date  . Depression   . GERD (gastroesophageal reflux disease)   . Seizure disorder   . Gait disorder   . Anoxic brain injury   . Chronic neck pain   . Chronic pain in shoulder   . COPD (chronic obstructive pulmonary disease)   . LBP (low back pain)   . Osteoarthritis   . Type II or unspecified type diabetes mellitus without mention of complication, not stated as uncontrolled 2010  . Anxiety   . Vitamin B 12 deficiency 2011  . Hyperlipidemia   . Seizures   . 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 Hernia of abdominal cavity   . Neuropathy    Past Surgical History  Procedure Laterality Date  . Pancreatectomy    . Hernia repair      incisional  . Colonoscopy    . Tonsillectomy    . Tubal ligation    . Diagnostic laparoscopy      PMH: Exploratory lap   History   Social History  . Marital Status: Married    Spouse Name: N/A    Number of Children: N/A  . Years of Education: N/A   Occupational History  . disabled    Social History Main Topics  . Smoking status: Current Every Day Smoker -- 1.00 packs/day for 20 years    Types: Cigarettes  . Smokeless tobacco: Never Used  . Alcohol Use: 6.0 oz/week    10 Glasses of wine per week     Comment: 1 glass of wine daily  . Drug Use: No  .  Sexual Activity: Yes   Other Topics Concern  . None   Social History Narrative  . None   Family History  Problem Relation Age of Onset  . Hypertension Other   . Heart disease Father 63    MI   Allergies  Allergen Reactions  . Alendronate Sodium   . Asa [Aspirin] Other (See Comments)    "stomach burning", "I can take baby aspirin"  . Fluoxetine Hcl    Prior to Admission medications   Medication Sig Start Date End Date Taking? Authorizing Provider  aspirin 81 MG EC tablet Take 81 mg by mouth daily.     Yes Historical Provider, MD  carbidopa-levodopa (SINEMET IR) 25-100 MG per tablet Take 1 tablet by mouth 3 (three) times daily. 12/11/13  Yes Aleksei Plotnikov V, MD  cholecalciferol (VITAMIN D) 1000 UNITS tablet Take 1,000 Units by mouth daily.   Yes Historical Provider, MD  HYDROmorphone (DILAUDID) 2 MG tablet Take 2-4 mg by mouth every 6 (six) hours as needed for moderate pain or severe pain.    Yes Historical Provider, MD  l-methylfolate-B6-B12 (METANX) 3-35-2 MG TABS Take 1 tablet by mouth 2 (two) times daily. 01/09/13  Yes Aleksei Plotnikov V, MD  LORazepam (ATIVAN) 2 MG tablet Take  1 mg by mouth every 6 (six) hours as needed for anxiety.   Yes Historical Provider, MD  metFORMIN (GLUCOPHAGE-XR) 500 MG 24 hr tablet Take 500 mg by mouth 3 (three) times daily.   Yes Historical Provider, MD  mirtazapine (REMERON) 15 MG tablet Take 1 tablet (15 mg total) by mouth at bedtime. Take at 6-8 pm 04/21/14 04/21/15 Yes Aleksei Plotnikov V, MD  morphine (MS CONTIN) 15 MG 12 hr tablet Take 15 mg by mouth every 12 (twelve) hours.   Yes Historical Provider, MD  promethazine (PHENERGAN) 12.5 MG tablet Take 12.5 mg by mouth every 6 (six) hours as needed. 10/05/11 06/23/14 Yes Aleksei Plotnikov V, MD  tolterodine (DETROL LA) 4 MG 24 hr capsule Take 4 mg by mouth at bedtime.   Yes Historical Provider, MD  vitamin B-12 (CYANOCOBALAMIN) 500 MCG tablet Take 1 tablet (500 mcg total) by mouth daily. 10/05/11  Yes  Aleksei Plotnikov V, MD  zolpidem (AMBIEN) 10 MG tablet Take 10 mg by mouth at bedtime as needed for sleep.   Yes Historical Provider, MD     Positive ROS: All other systems have been reviewed and were otherwise negative with the exception of those mentioned in the HPI and as above.  Physical Exam: General: Alert, no acute distress Cardiovascular: No pedal edema Respiratory: No cyanosis, no use of accessory musculature GI: No organomegaly, abdomen is soft and non-tender Skin: No lesions in the area of chief complaint with the exception of bruising around the left arm Neurologic: She has some diffuse patchy abnormal areas of sensation in the hand, both in the radial and ulnar nerve distribution Psychiatric: Patient is competent for consent with normal mood and affect are reasonable affect, and she has her husband who assists with her care with her. Lymphatic: No axillary or cervical lymphadenopathy  MUSCULOSKELETAL: Left hand has intact finger flexion, abduction, and extension throughout.  Assessment: left proximal humerus fracture with multiple coexisting comorbidities as indicated above.  Plan: Plan for Procedure(s): LEFT OPEN REDUCTION INTERNAL FIXATION (ORIF) PROXIMAL HUMERUS FRACTURE  The risks benefits and alternatives were discussed with the patient including but not limited to the risks of nonoperative treatment, versus surgical intervention including infection, bleeding, nerve injury, malunion, nonunion, the need for revision surgery, hardware prominence, hardware failure, the need for hardware removal, blood clots, cardiopulmonary complications, morbidity, mortality, among others, and they were willing to proceed.     Eulas Post, MD Cell [phone]   06/24/2014 11:31 AM

## 2014-06-24 NOTE — Op Note (Signed)
06/24/2014  2:32 PM  PATIENT:  [name]    PRE-OPERATIVE DIAGNOSIS:  left proximal humerus fracture that extends down into the shaft  POST-OPERATIVE DIAGNOSIS:  Same  PROCEDURE:  LEFT OPEN REDUCTION INTERNAL FIXATION (ORIF) PROXIMAL HUMERUS FRACTURE with fixation into the humeral head extending down into the shaft  SURGEON:  Eulas Post, MD  PHYSICIAN ASSISTANT: Janace Litten, OPA-C, present and scrubbed throughout the case, critical for completion in a timely fashion, and for retraction, instrumentation, and closure.  ANESTHESIA:   General  PREOPERATIVE INDICATIONS:  [name] is a  63 y.o. female with a diagnosis of left proximal humerus fracture that extended down into the shaft who elected for surgical management.  She has severe gait disorder, with lots of falls, and uses a walker for ambulation.  The risks benefits and alternatives were discussed with the patient including but not limited to the risks of nonoperative treatment, versus surgical intervention including infection, bleeding, nerve injury, malunion, nonunion, the need for revision surgery, hardware prominence, hardware failure, the need for hardware removal, blood clots, cardiopulmonary complications, conversion to arthroplasty, morbidity, mortality, among others, and they were willing to proceed.  Predicted outcome is good, although there will be at least a six to nine month expected recovery.    OPERATIVE IMPLANTS: Biomet S3 locking plate, long plate with proximal interlocking pegs and multiple distal and interfragmentary locking and nonlocking screws.  OPERATIVE FINDINGS: Displaced proximal humerus fracture  OPERATIVE PROCEDURE: The patient was brought to the operating room and placed in the supine position. General anesthesia was administered. IV antibiotics were given. She was placed in the beach chair position. All bony prominences were padded. The upper extremity was prepped and draped in usual sterile  fashion. Deltopectoral incision was performed. I also performed an extended deltopectoral approach with a brachialis splitting approach taking care to protect the radial nerve throughout the case, along with the axillary nerve underneath the neck of the humerus.  I exposed the fracture site, and placed deep retractors. I did not tenotomize the biceps tendon. This was left in place. I elevated a moderate portion of the deltoid off of the shaft, only as much as was absolutely necessary, in order to gain access for the plate. I exposed the fracture distally, initially a traumatic K wire as well as a clamp, and I also attempted a lag screw, fixation of all these pieces was extremely challenging. Ultimately I resorted to placing the plate onto the distal segment into the appropriate position, and then keying in the plate affectively as an anti-glide technique against the distal segment, once I had recreated the tube of the shaft, I reduced and secured the proximal head segment to the tube, held provisionally with a clamp, as I could get a clamp around the inferior portion of the neck, used a sharp tenaculum taking care to avoid injury to the axillary nerve.  I then placed K wires into the head, and had satisfactory position of the overall construct. I confirmed this on C-arm position, and then continued by placing an interfragmentary screw through the plate, and through the multiple segments, and then secured the plate to the head using the smooth pegs, taking care to prevent penetration into the arch articular surface, using C-arm, as well as manual feel using a hand drill.  I then secured the plate distally using a total of 3 screws below the fracture, 2 interfragmentary screws, and 6 pegs into the head. Once complete fixation and reduction of been  achieved, took final C-arm pictures, and irrigated the wounds copiously, and repaired the deltopectoral interval with Vicryl followed by Vicryl for the subcutaneous  tissue with Monocryl and Steri-Strips for the skin. She was placed in a sling. She had a preoperative regional block as well. She tolerated the procedure well with no complications.

## 2014-06-24 NOTE — Transfer of Care (Signed)
Immediate Anesthesia Transfer of Care Note  Patient: [name]  Procedure(s) Performed: Procedure(s): LEFT OPEN REDUCTION INTERNAL FIXATION (ORIF) PROXIMAL HUMERUS FRACTURE (Left)  Patient Location: PACU  Anesthesia Type:General  Level of Consciousness: awake, alert , oriented and patient cooperative  Airway & Oxygen Therapy: Patient Spontanous Breathing and Patient connected to nasal cannula oxygen  Post-op Assessment: Report given to PACU RN, Post -op Vital signs reviewed and stable and Patient moving all extremities  Post vital signs: Reviewed and stable  Complications: No apparent anesthesia complications

## 2014-06-24 NOTE — Anesthesia Preprocedure Evaluation (Addendum)
Anesthesia Evaluation  Patient identified by MRN, date of birth, ID band Patient awake    Reviewed: Allergy & Precautions, H&P , NPO status , Patient's Chart, lab work & pertinent test results  Airway Mallampati: II TM Distance: >3 FB Neck ROM: Full    Dental  (+) Teeth Intact   Pulmonary COPDCurrent Smoker,  breath sounds clear to auscultation        Cardiovascular Rhythm:Regular Rate:Normal     Neuro/Psych Seizures -, Well Controlled,  PSYCHIATRIC DISORDERS Anxiety Depression    GI/Hepatic Neg liver ROS, GERD-  Medicated and Controlled,  Endo/Other  diabetes, Well Controlled, Type 2, Oral Hypoglycemic Agents  Renal/GU negative Renal ROS     Musculoskeletal  (+) Arthritis -,   Abdominal   Peds  Hematology negative hematology ROS (+)   Anesthesia Other Findings   Reproductive/Obstetrics                       Anesthesia Physical Anesthesia Plan  ASA: II  Anesthesia Plan: General   Post-op Pain Management:    Induction: Intravenous  Airway Management Planned: Oral ETT  Additional Equipment:   Intra-op Plan:   Post-operative Plan: Extubation in OR  Informed Consent: I have reviewed the patients History and Physical, chart, labs and discussed the procedure including the risks, benefits and alternatives for the proposed anesthesia with the patient or authorized representative who has indicated his/her understanding and acceptance.   Dental advisory given  Plan Discussed with: CRNA, Anesthesiologist and Surgeon  Anesthesia Plan Comments:         Anesthesia Quick Evaluation

## 2014-06-24 NOTE — Anesthesia Procedure Notes (Signed)
Anesthesia Regional Block:  Interscalene brachial plexus block  Pre-Anesthetic Checklist: ,, timeout performed, Correct Patient, Correct Site, Correct Laterality, Correct Procedure, Correct Position, site marked, Risks and benefits discussed,  Surgical consent,  Pre-op evaluation,  At surgeon's request and post-op pain management  Laterality: Left  Prep: chloraprep       Needles:  Injection technique: Single-shot  Needle Type: Echogenic Needle     Needle Length: 5cm 5 cm Needle Gauge: 21 and 21 G    Additional Needles:  Procedures: ultrasound guided (picture in chart) Interscalene brachial plexus block  Nerve Stimulator or Paresthesia:  Response: Deltoid, 0.44 mA,   Additional Responses:   Narrative:  Start time: 06/24/2014 3:20 PM End time: 06/24/2014 3:30 PM Injection made incrementally with aspirations every 5 mL.  Performed by: Personally  Anesthesiologist: Marcene Duos, MD  Additional Notes: Pt with chronic pain and smoking history. Surgeon requesting post-op pain block for pain control. Prepped and procedure performed in sterile fashion. BP identified and needle advanced under live u/s guidance. Deltoid response noted at 0.75mA and LA injected in slow incremental fashion. Total of 30cc's 0.5% bupi with 1:200k epi injected perineurally. No complications noted.

## 2014-06-24 NOTE — Discharge Instructions (Signed)
Diet: As you were doing prior to hospitalization   Shower:  May shower but keep the wounds dry, use an occlusive plastic wrap, NO SOAKING IN TUB.  If the bandage gets wet, change with a clean dry gauze.  Dressing:  You may change your dressing 3-5 days after surgery.  Then change the dressing daily with sterile gauze dressing.    There are sticky tapes (steri-strips) on your wounds and all the stitches are absorbable.  Leave the steri-strips in place when changing your dressings, they will peel off with time, usually 2-3 weeks.  Activity:  Increase activity slowly as tolerated, but follow the weight bearing instructions below.  No lifting or driving for 6 weeks.  Weight Bearing:   Sling at all times, no weight on left arm.    To prevent constipation: you may use a stool softener such as -  Colace (over the counter) 100 mg by mouth twice a day  Drink plenty of fluids (prune juice may be helpful) and high fiber foods Miralax (over the counter) for constipation as needed.    Itching:  If you experience itching with your medications, try taking only a single pain pill, or even half a pain pill at a time.  You may take up to 10 pain pills per day, and you can also use benadryl over the counter for itching or also to help with sleep.   Precautions:  If you experience chest pain or shortness of breath - call 911 immediately for transfer to the hospital emergency department!!  If you develop a fever greater that 101 F, purulent drainage from wound, increased redness or drainage from wound, or calf pain -- Call the office at [phone]                                                Follow- Up Appointment:  Please call for an appointment to be seen in 2 weeks [facility] - [phone]

## 2014-06-25 ENCOUNTER — Encounter (HOSPITAL_COMMUNITY): Payer: Self-pay | Admitting: Orthopedic Surgery

## 2014-06-25 LAB — CBC
HCT: 37.3 % (ref 36.0–46.0)
Hemoglobin: 12.2 g/dL (ref 12.0–15.0)
MCH: 30.2 pg (ref 26.0–34.0)
MCHC: 32.7 g/dL (ref 30.0–36.0)
MCV: 92.3 fL (ref 78.0–100.0)
Platelets: 193 10*3/uL (ref 150–400)
RBC: 4.04 MIL/uL (ref 3.87–5.11)
RDW: 14.6 % (ref 11.5–15.5)
WBC: 8 10*3/uL (ref 4.0–10.5)

## 2014-06-25 LAB — BASIC METABOLIC PANEL
Anion gap: 13 (ref 5–15)
BUN: 7 mg/dL (ref 6–23)
CO2: 25 mEq/L (ref 19–32)
Calcium: 8.5 mg/dL (ref 8.4–10.5)
Chloride: 101 mEq/L (ref 96–112)
Creatinine, Ser: 0.39 mg/dL — ABNORMAL LOW (ref 0.50–1.10)
GFR calc Af Amer: >90 mL/min (ref >90)
GFR calc non Af Amer: >90 mL/min (ref >90)
Glucose, Bld: 136 mg/dL — ABNORMAL HIGH (ref 70–99)
Potassium: 4.2 mEq/L (ref 3.7–5.3)
Sodium: 139 mEq/L (ref 137–147)

## 2014-06-25 LAB — GLUCOSE, CAPILLARY
Glucose-Capillary: 139 mg/dL — ABNORMAL HIGH (ref 70–99)
Glucose-Capillary: 154 mg/dL — ABNORMAL HIGH (ref 70–99)

## 2014-06-25 NOTE — Anesthesia Postprocedure Evaluation (Signed)
  Anesthesia Post-op Note  Patient: [name]  Procedure(s) Performed: Procedure(s): LEFT OPEN REDUCTION INTERNAL FIXATION (ORIF) PROXIMAL HUMERUS FRACTURE (Left)  Patient Location: PACU  Anesthesia Type:GA combined with regional for post-op pain  Level of Consciousness: awake, alert  and oriented  Airway and Oxygen Therapy: Patient Spontanous Breathing  Post-op Pain: mild  Post-op Assessment: Post-op Vital signs reviewed  Post-op Vital Signs: Reviewed  Last Vitals:  Filed Vitals:   06/25/14 0541  BP: 135/60  Pulse: 84  Temp: 36.9 C  Resp: 16    Complications: No apparent anesthesia complications

## 2014-06-25 NOTE — Progress Notes (Signed)
Physical Therapy Note  Attempted to see in AM for physical therapy evaluation however patient was in too much pain and requested PT return in afternoon. Patient was discharged prior to PT returning for eval. We are unsure of equipment needs or need for further skilled PT recommendations.   [address] [place], [place] [phone]

## 2014-06-25 NOTE — Progress Notes (Signed)
PT Cancellation Note  Patient Details Name: [name] MRN: [id number] DOB: 22-Feb-1951   Cancelled Treatment:    Reason Eval/Treat Not Completed: Pain limiting ability to participate  Attempted to see patient for physical therapy evaluation this AM. She requests this be held until this afternoon as she is in too much pain to work with therapy at the moment. Will follow up as able.  [address] [place], [place] [phone]  Berton Mount 06/25/2014, 11:19 AM

## 2014-06-25 NOTE — Progress Notes (Signed)
Utilization review completed.  

## 2014-06-25 NOTE — Progress Notes (Addendum)
Patient ID: [name], female   DOB: [birth year], 63 y.o.   MRN: [id number]     Subjective:  Patient reports pain as mild to moderate.  Patient is sitting in bed in mild distress. Denies any CP or SOB  Objective:   VITALS:   Filed Vitals:   06/24/14 1709 06/24/14 2118 06/25/14 0123 06/25/14 0541  BP: 106/56 137/72 143/76 135/60  Pulse: 86 102 87 84  Temp: 99.5 F (37.5 C) 98.5 F (36.9 C) 98.1 F (36.7 C) 98.5 F (36.9 C)  TempSrc: Axillary     Resp: Height:      Weight:      SpO2: 93% 91% 100% 100%    ABD soft Sensation intact distally Dorsiflexion/Plantar flexion intact Incision: dressing C/D/I and scant drainage Good wrist, hand and finger motion  Lab Results  Component Value Date   WBC 9.5 06/24/2014   HGB 13.2 06/24/2014   HCT 40.0 06/24/2014   MCV 93.0 06/24/2014   PLT 213 06/24/2014   BMET    Component Value Date/Time   NA 137 06/24/2014 0906   K 4.2 06/24/2014 0906   CL 100 06/24/2014 0906   CO2 26 06/24/2014 0906   GLUCOSE 176* 06/24/2014 0906   BUN 6 06/24/2014 0906   CREATININE 0.37* 06/24/2014 0906   CALCIUM 8.9 06/24/2014 0906   GFRNONAA >90 06/24/2014 0906   GFRAA >90 06/24/2014 0906     Assessment/Plan: 1 Day Post-Op   Principal Problem:   Fracture of left humerus Active Problems:   Humerus fracture   Advance diet Up with therapy Dry dressing PRN Sling at all times NWB left upper ext Plan for discharge today if pain control and passed with PT.  Torrie Mayers, BRANDON 06/25/2014, 7:12 AM  Discussed and agree with above. Teryl Lucy, MD Cell [phone]

## 2014-06-25 NOTE — Discharge Summary (Signed)
Physician Discharge Summary  Patient ID: [name] MRN: [id number] DOB/AGE: 12-04-1950 63 y.o.  Admit date: 06/24/2014 Discharge date: 06/25/2014  Admission Diagnoses:  Fracture of left humerus  Discharge Diagnoses:  Principal Problem:   Fracture of left humerus Active Problems:   Humerus fracture   Past Medical History  Diagnosis Date  . Depression   . GERD (gastroesophageal reflux disease)   . Seizure disorder   . Gait disorder   . Anoxic brain injury   . Chronic neck pain   . Chronic pain in shoulder   . COPD (chronic obstructive pulmonary disease)   . LBP (low back pain)   . Osteoarthritis   . Type II or unspecified type diabetes mellitus without mention of complication, not stated as uncontrolled 2010  . Anxiety   . Vitamin B 12 deficiency 2011  . Hyperlipidemia   . Seizures   . 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 Hernia of abdominal cavity   . Neuropathy   . Fracture of left humerus 06/24/2014    Surgeries: Procedure(s): LEFT OPEN REDUCTION INTERNAL FIXATION (ORIF) PROXIMAL HUMERUS FRACTURE on 06/24/2014   Consultants (if any):    Discharged Condition: Improved  Hospital Course: PYPER OLEXA is an 63 y.o. female who was admitted 06/24/2014 with a diagnosis of Fracture of left humerus and went to the operating room on 06/24/2014 and underwent the above named procedures.    She was given perioperative antibiotics:  Anti-infectives   Start     Dose/Rate Route Frequency Ordered Stop   06/24/14 2000  ceFAZolin (ANCEF) IVPB 1 g/50 mL premix     1 g 100 mL/hr over 30 Minutes Intravenous Every 6 hours 06/24/14 1700 06/25/14 1359   06/24/14 0930  ceFAZolin (ANCEF) IVPB 2 g/50 mL premix     2 g 100 mL/hr over 30 Minutes Intravenous On call to O.R. 06/24/14 0915 06/24/14 1156    .  She was given sequential compression devices, early ambulation for DVT prophylaxis.  She benefited maximally from the hospital stay and there  were no complications.    Recent vital signs:  Filed Vitals:   06/25/14 0541  BP: 135/60  Pulse: 84  Temp: 98.5 F (36.9 C)  Resp: 16    Recent laboratory studies:  Lab Results  Component Value Date   HGB 13.2 06/24/2014   HGB 15.4* 12/24/2012   HGB 14.1 06/26/2011   Lab Results  Component Value Date   WBC 9.5 06/24/2014   PLT 213 06/24/2014   Lab Results  Component Value Date   INR 1.0 ratio 10/21/2009   Lab Results  Component Value Date   NA 137 06/24/2014   K 4.2 06/24/2014   CL 100 06/24/2014   CO2 26 06/24/2014   BUN 6 06/24/2014   CREATININE 0.37* 06/24/2014   GLUCOSE 176* 06/24/2014    Discharge Medications:     Medication List    STOP taking these medications       promethazine 12.5 MG tablet  Commonly known as:  PHENERGAN      TAKE these medications       aspirin 81 MG EC tablet  Take 81 mg by mouth daily.     baclofen 10 MG tablet  Commonly known as:  LIORESAL  Take 1 tablet (10 mg total) by mouth 3 (three) times daily. As needed for muscle spasm     carbidopa-levodopa 25-100 MG per tablet  Commonly known as:  SINEMET IR  Take 1 tablet by mouth 3 (three) times daily.     cholecalciferol 1000 UNITS tablet  Commonly known as:  VITAMIN D  Take 1,000 Units by mouth daily.     diazepam 5 MG tablet  Commonly known as:  VALIUM  Take 1 tablet (5 mg total) by mouth every 6 (six) hours as needed for anxiety.     HYDROmorphone 2 MG tablet  Commonly known as:  DILAUDID  Take 2-3 tablets (4-6 mg total) by mouth every 6 (six) hours as needed for moderate pain or severe pain.     l-methylfolate-B6-B12 3-35-2 MG Tabs  Commonly known as:  METANX  Take 1 tablet by mouth 2 (two) times daily.     LORazepam 2 MG tablet  Commonly known as:  ATIVAN  Take 1 mg by mouth every 6 (six) hours as needed for anxiety.     metFORMIN 500 MG 24 hr tablet  Commonly known as:  GLUCOPHAGE-XR  Take 500 mg by mouth 3 (three) times daily.     mirtazapine 15 MG tablet   Commonly known as:  REMERON  Take 1 tablet (15 mg total) by mouth at bedtime. Take at 6-8 pm     morphine 15 MG 12 hr tablet  Commonly known as:  MS CONTIN  Take 15 mg by mouth every 12 (twelve) hours.     Oxycodone HCl 10 MG Tabs  Take 1 tablet (10 mg total) by mouth 4 (four) times daily as needed (severe pain).     oxyCODONE-acetaminophen 10-325 MG per tablet  Commonly known as:  PERCOCET  Take 1-2 tablets by mouth every 6 (six) hours as needed for pain. MAXIMUM TOTAL ACETAMINOPHEN DOSE IS 4000 MG PER DAY     sennosides-docusate sodium 8.6-50 MG tablet  Commonly known as:  SENOKOT-S  Take 2 tablets by mouth daily.     tolterodine 4 MG 24 hr capsule  Commonly known as:  DETROL LA  Take 4 mg by mouth at bedtime.     vitamin B-12 500 MCG tablet  Commonly known as:  CYANOCOBALAMIN  Take 1 tablet (500 mcg total) by mouth daily.     zolpidem 10 MG tablet  Commonly known as:  AMBIEN  Take 10 mg by mouth at bedtime as needed for sleep.        Diagnostic Studies: Dg Chest 2 View  06/24/2014   CLINICAL DATA:  Preoperative for ORIF of a proximal humeral fracture  EXAM: CHEST  2 VIEW  COMPARISON:  PA and lateral chest x-ray dated January 12, 2011.  FINDINGS: The lungs are well-expanded. There is no focal infiltrate. There is no pleural effusion or pneumothorax. The heart and pulmonary vascularity are within the limits of normal. There are surgical clips in the left upper quadrant of the abdomen. The ribs and thoracic spine are unremarkable.  IMPRESSION: There is no acute cardiopulmonary abnormality. Mild stable hyperinflation is consistent with known COPD.   Electronically Signed   By: David  [place]   On: 06/24/2014 09:43   Dg Humerus Left  06/24/2014   CLINICAL DATA:  Post ORIF LEFT humerus  EXAM: LEFT HUMERUS - 2+ VIEW  COMPARISON:  Portable exam 1532 hr compared to intraoperative exam of 06/24/2014  FINDINGS: Plate and multiple screws present at the proximal LEFT humerus post ORIF of a  comminuted mildly displaced proximal humeral diaphyseal fracture.  Distal humerus appears intact.  Elbow and shoulder joint alignments appear grossly normal.  IMPRESSION: Post ORIF of the proximal  LEFT humerus.   Electronically Signed   By: Ulyses Southward M.D.   On: 06/24/2014 15:49   Dg Humerus Left  06/24/2014   CLINICAL DATA:  Fixation left humerus fracture.  EXAM: DG C-ARM 61-120 MIN; LEFT HUMERUS - 2+ VIEW  COMPARISON:  Plain films left humerus 06/21/2014.  FINDINGS: We are provided with 5 fluoroscopic intraoperative spot views of the left humerus. Images demonstrate placement of plate and screws for fixation of a proximal fracture. Position and alignment are markedly improved. Hardware is intact. No acute abnormality is identified.  IMPRESSION: ORIF left humerus fracture without evidence of complication.   Electronically Signed   By: Drusilla Kanner M.D.   On: 06/24/2014 14:59   Dg Humerus Left  06/21/2014   CLINICAL DATA:  Fall with deformity of humerus  EXAM: LEFT HUMERUS - 2+ VIEW  COMPARISON:  None.  FINDINGS: There is a spiral fracture of the proximal shaft of the humerus extending from the humeral neck into the mid humerus. Fractures comminuted with 3 components. Distal fragment is displaced approximately 7 mm medially.  IMPRESSION: Mildly to moderately displaced fracture of the proximal humeral shaft.   Electronically Signed   By: Esperanza Heir M.D.   On: 06/21/2014 00:38   Dg C-arm 61-120 Min  06/24/2014   CLINICAL DATA:  Fixation left humerus fracture.  EXAM: DG C-ARM 61-120 MIN; LEFT HUMERUS - 2+ VIEW  COMPARISON:  Plain films left humerus 06/21/2014.  FINDINGS: We are provided with 5 fluoroscopic intraoperative spot views of the left humerus. Images demonstrate placement of plate and screws for fixation of a proximal fracture. Position and alignment are markedly improved. Hardware is intact. No acute abnormality is identified.  IMPRESSION: ORIF left humerus fracture without evidence of  complication.   Electronically Signed   By: Drusilla Kanner M.D.   On: 06/24/2014 14:59    Disposition: 01-Home or Self Care      Discharge Instructions   Call MD / Call 911    Complete by:  As directed   If you experience chest pain or shortness of breath, CALL 911 and be transported to the hospital emergency room.  If you develope a fever above 101 F, pus (white drainage) or increased drainage or redness at the wound, or calf pain, call your surgeon's office.     Call MD / Call 911    Complete by:  As directed   If you experience chest pain or shortness of breath, CALL 911 and be transported to the hospital emergency room.  If you develope a fever above 101 F, pus (white drainage) or increased drainage or redness at the wound, or calf pain, call your surgeon's office.     Constipation Prevention    Complete by:  As directed   Drink plenty of fluids.  Prune juice may be helpful.  You may use a stool softener, such as Colace (over the counter) 100 mg twice a day.  Use MiraLax (over the counter) for constipation as needed.     Constipation Prevention    Complete by:  As directed   Drink plenty of fluids.  Prune juice may be helpful.  You may use a stool softener, such as Colace (over the counter) 100 mg twice a day.  Use MiraLax (over the counter) for constipation as needed.     Diet general    Complete by:  As directed      Diet general    Complete by:  As directed  Discharge instructions    Complete by:  As directed   Change dressing in 3 days and reapply fresh dressing, unless you have a splint (half cast).  If you have a splint/cast, just leave in place until your follow-up appointment.    Keep wounds dry for 3 weeks.  Leave steri-strips in place on skin.  Do not apply lotion or anything to the wound.     Discharge instructions    Complete by:  As directed   Change dressing in 3 days and reapply fresh dressing, unless you have a splint (half cast).  If you have a splint/cast,  just leave in place until your follow-up appointment.    Keep wounds dry for 3 weeks.  Leave steri-strips in place on skin.  Do not apply lotion or anything to the wound.  Medication instructions: Continue with your MS Contin and your hydromorphone as scheduled by your pain physician. I've also provided U. Percocet 10/325, which I would use up to 10 pills of that for pain per day, 2 every 6 hours as needed. If you need more pain medication, and then I would use oxycodone 10 mg plain that I have provided as a backup. If you do not need the additional oxycodone then I would not take it. Bewear of increased sleepiness, drowsiness, or substantial decrease in breathing.           Follow-up Information   Follow up with Eulas Post, MD. Schedule an appointment as soon as possible for a visit in 2 weeks.   Specialty:  Orthopedic Surgery   Contact information:   [address] [place] [address] [phone]        Signed: Eulas Post 06/25/2014, 7:40 AM

## 2014-06-25 NOTE — Evaluation (Signed)
Occupational Therapy Evaluation Patient Details Name: [name] MRN: [id number] DOB: 1951/02/21 Today's Date: 06/25/2014    History of Present Illness s/p ORIF proximal humerus fracture; h/o seizure disorder, COPD, gait disorder, anoxic brain injury,chronic neck and low back pain; neuropathy        Clinical Impression  Pt admitted with the above diagnoses and presents with below problem list. Pt will benefit from continued acute OT to address the below listed deficits and maximize independence with basic ADLs prior to d/c home with 24 supervision/assistance from family/CNA. PTA pt received standby assistance from in-home CNA, CNA provided setup of ADLs. Pt currently at setup to min A level for ADLs. Pain a limiting factor this session with pt declining EOB and transfers this session. OT to continue to follow and treat.    Follow Up Recommendations  Home health OT;Supervision/Assistance - 24 hour    Equipment Recommendations  None recommended by OT;Other (comment) (pt has recommended equipment)    Recommendations for Other Services       Precautions / Restrictions Precautions Precautions: Shoulder Type of Shoulder Precautions: NWB, no shoulder motion Shoulder Interventions: Shoulder sling/immobilizer;Off for dressing/bathing/exercises Precaution Booklet Issued: Yes (comment) Precaution Comments: reviewed precautions with pt and spouse Required Braces or Orthoses: Sling Restrictions Weight Bearing Restrictions: Yes LUE Weight Bearing: Non weight bearing      Mobility Bed Mobility               General bed mobility comments: Able to sit up in bed and maintain position for 3-4 minutes. Pt declined EOB due to 9/10 pain.  Transfers                 General transfer comment: Pt declined transfers this session due to 9/10 pain. Per nursing she did require assistance to transfer to [facility] this am.    Balance Overall balance assessment: History of Falls                                           ADL Overall ADL's : Needs assistance/impaired Eating/Feeding: Set up;Sitting   Grooming: Set up;Sitting;Cueing for compensatory techniques   Upper Body Bathing: Min guard;Sitting;Cueing for compensatory techniques   Lower Body Bathing: Sit to/from stand;Minimal assistance   Upper Body Dressing : Minimal assistance;Sitting   Lower Body Dressing: Sit to/from stand;Minimal assistance   Toilet Transfer: Minimal assistance;Stand-pivot;BSC   Toileting- Clothing Manipulation and Hygiene: Minimal assistance;Sit to/from stand;Cueing for compensatory techniques   Tub/ Shower Transfer: Minimal assistance;Stand-pivot;3 in 1   Functional mobility during ADLs: Minimal assistance General ADL Comments: Pt at min A level for UB ADLs due to balance. Overall ADLs setup-min A. Educated pt and spouse on techniques, AE, and DME for safe completion of ADLs with shoulder precautions. Sling and exercise education provided as well      Vision                     Perception     Praxis      Pertinent Vitals/Pain Pain Assessment: 0-10 Pain Score: 9  Pain Location: LUE  Pain Descriptors / Indicators: Aching;Constant Pain Intervention(s): Limited activity within patient's tolerance;Monitored during session;Premedicated before session;Repositioned;Utilized relaxation techniques;Ice applied     Hand Dominance Right   Extremity/Trunk Assessment Upper Extremity Assessment Upper Extremity Assessment: LUE deficits/detail LUE Deficits / Details: s/p L ORIF proximal humerus fracture LUE: Unable to fully  assess due to immobilization LUE Sensation: history of peripheral neuropathy   Lower Extremity Assessment Lower Extremity Assessment: Defer to PT evaluation       Communication Communication Communication: No difficulties   Cognition Arousal/Alertness: Awake/alert Behavior During Therapy: WFL for tasks assessed/performed Overall Cognitive Status:  Within Functional Limits for tasks assessed                     General Comments       Exercises Exercises: Other exercises Other Exercises Other Exercises: neck flexion/extension, 10 reps, seated Other Exercises: neck abduction/adduction, 10 reps, seated Other Exercises: L wrist flexion/extension, 5 reps, seated Other Exercises: L digit composite flexion/extension, 5 reps, seated   Shoulder Instructions      Home Living Family/patient expects to be discharged to:: Private residence Living Arrangements: Spouse/significant other Available Help at Discharge: Family;Other (Comment) (CNA during the day) Type of Home: House Home Access: Stairs to enter [employer] of Steps: 4 Entrance Stairs-Rails: Right;Left;Can reach both Home Layout: One level     Bathroom Shower/Tub: Producer, television/film/video: Standard Bathroom Accessibility: Yes How Accessible: Accessible via walker Home Equipment: Walker - 2 wheels;Bedside commode;Shower seat - built in;Grab bars - toilet;Grab bars - tub/shower;Hand held shower head          Prior Functioning/Environment Level of Independence: Needs assistance    ADL's / Homemaking Assistance Needed: standby assistance from CNA, CNA provided setup of ADLs        OT Diagnosis: Acute pain   OT Problem List: Impaired balance (sitting and/or standing);Decreased knowledge of use of DME or AE;Decreased knowledge of precautions;Pain;Impaired UE functional use   OT Treatment/Interventions: Self-care/ADL training;Therapeutic exercise;DME and/or AE instruction;Patient/family education;Balance training;Therapeutic activities    OT Goals(Current goals can be found in the care plan section) Acute Rehab OT Goals Patient Stated Goal: not stated OT Goal Formulation: With patient/family Time For Goal Achievement: 07/02/14 Potential to Achieve Goals: Good ADL Goals Pt Will Perform Upper Body Bathing: with supervision;sitting Pt Will  Perform Lower Body Bathing: with supervision;with adaptive equipment;sit to/from stand Pt Will Perform Upper Body Dressing: with supervision;sitting;with caregiver independent in assisting Pt Will Perform Lower Body Dressing: with supervision;with caregiver independent in assisting;sit to/from stand Pt Will Transfer to Toilet: with supervision;ambulating Pt Will Perform Toileting - Clothing Manipulation and hygiene: with supervision;sit to/from stand Pt Will Perform Tub/Shower Transfer: with supervision;ambulating;3 in 1 Pt/caregiver will Perform Home Exercise Program: Left upper extremity;With written HEP provided;With Supervision  OT Frequency: Min 3X/week   Barriers to D/C:            Co-evaluation              End of Session Equipment Utilized During Treatment: Other (comment);Oxygen (sling) Nurse Communication: Other (comment) (pain level, oxygen stats)  Activity Tolerance: Patient limited by pain Patient left: in bed;with call bell/phone within reach;with family/visitor present   Time: 1610-9604 OT Time Calculation (min): 49 min Charges:  OT General Charges $OT Visit: 1 Procedure OT Evaluation $Initial OT Evaluation Tier I: 1 Procedure OT Treatments $Self Care/Home Management : 23-37 mins G-Codes:    Pilar Grammes 06-29-14, 10:18 AM

## 2014-06-26 ENCOUNTER — Other Ambulatory Visit: Payer: Self-pay | Admitting: Internal Medicine

## 2014-06-26 NOTE — Progress Notes (Signed)
Discharge instructions reviewed by Director. Patient D/C in stable condition.

## 2014-07-05 ENCOUNTER — Telehealth: Payer: Self-pay | Admitting: Neurology

## 2014-07-05 DIAGNOSIS — Z4789 Encounter for other orthopedic aftercare: Secondary | ICD-10-CM | POA: Diagnosis not present

## 2014-07-05 NOTE — Telephone Encounter (Signed)
Called pt to r/s 07/09/14 appt w/ Dr. Allena KatzPatel. Spoke w/ pt's nurse aid, she says pt has actually fell and had to have surgery. Her husband was going to call to r/s anyway. I have cancelled Friday's appt as requested and will await return call from husband to r/s / Oneita KrasSherri S,

## 2014-07-09 ENCOUNTER — Ambulatory Visit: Payer: Medicare Other | Admitting: Neurology

## 2014-07-14 DIAGNOSIS — S42302D Unspecified fracture of shaft of humerus, left arm, subsequent encounter for fracture with routine healing: Secondary | ICD-10-CM | POA: Diagnosis not present

## 2014-07-14 DIAGNOSIS — G894 Chronic pain syndrome: Secondary | ICD-10-CM | POA: Diagnosis not present

## 2014-07-14 DIAGNOSIS — Z79891 Long term (current) use of opiate analgesic: Secondary | ICD-10-CM | POA: Diagnosis not present

## 2014-07-18 ENCOUNTER — Other Ambulatory Visit: Payer: Self-pay | Admitting: Internal Medicine

## 2014-07-19 NOTE — Telephone Encounter (Signed)
Ok to Rf in PCP's absence?  

## 2014-08-02 DIAGNOSIS — S42302D Unspecified fracture of shaft of humerus, left arm, subsequent encounter for fracture with routine healing: Secondary | ICD-10-CM | POA: Diagnosis not present

## 2014-08-03 DIAGNOSIS — M25612 Stiffness of left shoulder, not elsewhere classified: Secondary | ICD-10-CM | POA: Diagnosis not present

## 2014-08-03 DIAGNOSIS — M6281 Muscle weakness (generalized): Secondary | ICD-10-CM | POA: Diagnosis not present

## 2014-08-03 DIAGNOSIS — M25512 Pain in left shoulder: Secondary | ICD-10-CM | POA: Diagnosis not present

## 2014-08-03 DIAGNOSIS — W19XXXD Unspecified fall, subsequent encounter: Secondary | ICD-10-CM | POA: Diagnosis not present

## 2014-08-07 ENCOUNTER — Other Ambulatory Visit: Payer: Self-pay | Admitting: Internal Medicine

## 2014-08-09 DIAGNOSIS — M47816 Spondylosis without myelopathy or radiculopathy, lumbar region: Secondary | ICD-10-CM | POA: Diagnosis not present

## 2014-08-09 DIAGNOSIS — G894 Chronic pain syndrome: Secondary | ICD-10-CM | POA: Diagnosis not present

## 2014-08-09 DIAGNOSIS — S42302D Unspecified fracture of shaft of humerus, left arm, subsequent encounter for fracture with routine healing: Secondary | ICD-10-CM | POA: Diagnosis not present

## 2014-08-09 DIAGNOSIS — Z79891 Long term (current) use of opiate analgesic: Secondary | ICD-10-CM | POA: Diagnosis not present

## 2014-08-09 NOTE — Telephone Encounter (Signed)
Pt states she is completely out of her ATIVAN at this time. She has called pharmacy and was informed that they need a prior authorization.

## 2014-08-10 DIAGNOSIS — M6281 Muscle weakness (generalized): Secondary | ICD-10-CM | POA: Diagnosis not present

## 2014-08-10 DIAGNOSIS — W19XXXD Unspecified fall, subsequent encounter: Secondary | ICD-10-CM | POA: Diagnosis not present

## 2014-08-10 DIAGNOSIS — M25512 Pain in left shoulder: Secondary | ICD-10-CM | POA: Diagnosis not present

## 2014-08-10 DIAGNOSIS — M25612 Stiffness of left shoulder, not elsewhere classified: Secondary | ICD-10-CM | POA: Diagnosis not present

## 2014-08-10 NOTE — Telephone Encounter (Signed)
In home nurse called back in regards to this.  She is requesting a call back at [phone] Herbert Seta( Heather).

## 2014-08-11 ENCOUNTER — Telehealth: Payer: Self-pay | Admitting: Internal Medicine

## 2014-08-11 MED ORDER — LORAZEPAM 2 MG PO TABS: ORAL_TABLET | ORAL | Status: DC

## 2014-08-11 NOTE — Telephone Encounter (Signed)
Md sign rx given to pt/aide...[name]/lmb

## 2014-08-11 NOTE — Telephone Encounter (Signed)
Patient is completely out of her ATIVAN at this time. She has called pharmacy and was informed that they need a prior authorization.  The in home nurse called again in regards to this.  She is requesting a call back at [phone] Herbert Seta( Heather).  She stated that the patient can go into convulsions if she does not have this medication.  She is very upset about this.

## 2014-08-11 NOTE — Telephone Encounter (Signed)
MD approved med and chose phone-in. Pt walked in requesting to pick rx up. Printed rx place on md counter top for signature...[name]/lmb

## 2014-08-12 ENCOUNTER — Telehealth: Payer: Self-pay

## 2014-08-12 DIAGNOSIS — M6281 Muscle weakness (generalized): Secondary | ICD-10-CM | POA: Diagnosis not present

## 2014-08-12 DIAGNOSIS — M25512 Pain in left shoulder: Secondary | ICD-10-CM | POA: Diagnosis not present

## 2014-08-12 DIAGNOSIS — M25612 Stiffness of left shoulder, not elsewhere classified: Secondary | ICD-10-CM | POA: Diagnosis not present

## 2014-08-12 DIAGNOSIS — W19XXXD Unspecified fall, subsequent encounter: Secondary | ICD-10-CM | POA: Diagnosis not present

## 2014-08-12 NOTE — Telephone Encounter (Signed)
Pt came in yesterday see previous msg rx was given to pt yesterday...[name]/lmb

## 2014-08-12 NOTE — Telephone Encounter (Signed)
Pt left message on VM requesting rx for Ativan.   The rx was printed on the 11th.

## 2014-08-12 NOTE — Telephone Encounter (Signed)
Error

## 2014-08-17 DIAGNOSIS — M6281 Muscle weakness (generalized): Secondary | ICD-10-CM | POA: Diagnosis not present

## 2014-08-17 DIAGNOSIS — M25612 Stiffness of left shoulder, not elsewhere classified: Secondary | ICD-10-CM | POA: Diagnosis not present

## 2014-08-17 DIAGNOSIS — M25512 Pain in left shoulder: Secondary | ICD-10-CM | POA: Diagnosis not present

## 2014-08-17 DIAGNOSIS — W19XXXD Unspecified fall, subsequent encounter: Secondary | ICD-10-CM | POA: Diagnosis not present

## 2014-08-19 DIAGNOSIS — W19XXXD Unspecified fall, subsequent encounter: Secondary | ICD-10-CM | POA: Diagnosis not present

## 2014-08-19 DIAGNOSIS — M25612 Stiffness of left shoulder, not elsewhere classified: Secondary | ICD-10-CM | POA: Diagnosis not present

## 2014-08-19 DIAGNOSIS — M6281 Muscle weakness (generalized): Secondary | ICD-10-CM | POA: Diagnosis not present

## 2014-08-19 DIAGNOSIS — M25512 Pain in left shoulder: Secondary | ICD-10-CM | POA: Diagnosis not present

## 2014-08-24 DIAGNOSIS — M25612 Stiffness of left shoulder, not elsewhere classified: Secondary | ICD-10-CM | POA: Diagnosis not present

## 2014-08-24 DIAGNOSIS — W19XXXD Unspecified fall, subsequent encounter: Secondary | ICD-10-CM | POA: Diagnosis not present

## 2014-08-24 DIAGNOSIS — M6281 Muscle weakness (generalized): Secondary | ICD-10-CM | POA: Diagnosis not present

## 2014-08-24 DIAGNOSIS — M25512 Pain in left shoulder: Secondary | ICD-10-CM | POA: Diagnosis not present

## 2014-08-30 DIAGNOSIS — M25512 Pain in left shoulder: Secondary | ICD-10-CM | POA: Diagnosis not present

## 2014-08-31 DIAGNOSIS — W19XXXD Unspecified fall, subsequent encounter: Secondary | ICD-10-CM | POA: Diagnosis not present

## 2014-08-31 DIAGNOSIS — M25612 Stiffness of left shoulder, not elsewhere classified: Secondary | ICD-10-CM | POA: Diagnosis not present

## 2014-08-31 DIAGNOSIS — M6281 Muscle weakness (generalized): Secondary | ICD-10-CM | POA: Diagnosis not present

## 2014-08-31 DIAGNOSIS — M25512 Pain in left shoulder: Secondary | ICD-10-CM | POA: Diagnosis not present

## 2014-09-02 ENCOUNTER — Other Ambulatory Visit: Payer: Self-pay | Admitting: Internal Medicine

## 2014-09-02 DIAGNOSIS — M25612 Stiffness of left shoulder, not elsewhere classified: Secondary | ICD-10-CM | POA: Diagnosis not present

## 2014-09-02 DIAGNOSIS — W19XXXD Unspecified fall, subsequent encounter: Secondary | ICD-10-CM | POA: Diagnosis not present

## 2014-09-02 DIAGNOSIS — M25512 Pain in left shoulder: Secondary | ICD-10-CM | POA: Diagnosis not present

## 2014-09-02 DIAGNOSIS — M6281 Muscle weakness (generalized): Secondary | ICD-10-CM | POA: Diagnosis not present

## 2014-09-07 DIAGNOSIS — W19XXXD Unspecified fall, subsequent encounter: Secondary | ICD-10-CM | POA: Diagnosis not present

## 2014-09-07 DIAGNOSIS — M25612 Stiffness of left shoulder, not elsewhere classified: Secondary | ICD-10-CM | POA: Diagnosis not present

## 2014-09-07 DIAGNOSIS — M25512 Pain in left shoulder: Secondary | ICD-10-CM | POA: Diagnosis not present

## 2014-09-07 DIAGNOSIS — M6281 Muscle weakness (generalized): Secondary | ICD-10-CM | POA: Diagnosis not present

## 2014-09-08 DIAGNOSIS — M47816 Spondylosis without myelopathy or radiculopathy, lumbar region: Secondary | ICD-10-CM | POA: Diagnosis not present

## 2014-09-08 DIAGNOSIS — G894 Chronic pain syndrome: Secondary | ICD-10-CM | POA: Diagnosis not present

## 2014-09-08 DIAGNOSIS — Z79891 Long term (current) use of opiate analgesic: Secondary | ICD-10-CM | POA: Diagnosis not present

## 2014-09-09 DIAGNOSIS — M6281 Muscle weakness (generalized): Secondary | ICD-10-CM | POA: Diagnosis not present

## 2014-09-09 DIAGNOSIS — M25612 Stiffness of left shoulder, not elsewhere classified: Secondary | ICD-10-CM | POA: Diagnosis not present

## 2014-09-09 DIAGNOSIS — M25512 Pain in left shoulder: Secondary | ICD-10-CM | POA: Diagnosis not present

## 2014-09-09 DIAGNOSIS — W19XXXD Unspecified fall, subsequent encounter: Secondary | ICD-10-CM | POA: Diagnosis not present

## 2014-09-14 DIAGNOSIS — M25512 Pain in left shoulder: Secondary | ICD-10-CM | POA: Diagnosis not present

## 2014-09-14 DIAGNOSIS — W19XXXD Unspecified fall, subsequent encounter: Secondary | ICD-10-CM | POA: Diagnosis not present

## 2014-09-14 DIAGNOSIS — M25612 Stiffness of left shoulder, not elsewhere classified: Secondary | ICD-10-CM | POA: Diagnosis not present

## 2014-09-14 DIAGNOSIS — M6281 Muscle weakness (generalized): Secondary | ICD-10-CM | POA: Diagnosis not present

## 2014-09-17 DIAGNOSIS — M6281 Muscle weakness (generalized): Secondary | ICD-10-CM | POA: Diagnosis not present

## 2014-09-17 DIAGNOSIS — M25512 Pain in left shoulder: Secondary | ICD-10-CM | POA: Diagnosis not present

## 2014-09-17 DIAGNOSIS — W19XXXD Unspecified fall, subsequent encounter: Secondary | ICD-10-CM | POA: Diagnosis not present

## 2014-09-17 DIAGNOSIS — M25612 Stiffness of left shoulder, not elsewhere classified: Secondary | ICD-10-CM | POA: Diagnosis not present

## 2014-09-27 DIAGNOSIS — M25512 Pain in left shoulder: Secondary | ICD-10-CM | POA: Diagnosis not present

## 2014-09-30 DIAGNOSIS — M47816 Spondylosis without myelopathy or radiculopathy, lumbar region: Secondary | ICD-10-CM | POA: Diagnosis not present

## 2014-09-30 DIAGNOSIS — Z79891 Long term (current) use of opiate analgesic: Secondary | ICD-10-CM | POA: Diagnosis not present

## 2014-09-30 DIAGNOSIS — G894 Chronic pain syndrome: Secondary | ICD-10-CM | POA: Diagnosis not present

## 2014-10-07 ENCOUNTER — Telehealth: Payer: Self-pay | Admitting: Internal Medicine

## 2014-10-07 ENCOUNTER — Other Ambulatory Visit: Payer: Self-pay | Admitting: Internal Medicine

## 2014-10-07 MED ORDER — LORAZEPAM 2 MG PO TABS: ORAL_TABLET | ORAL | Status: DC

## 2014-10-07 NOTE — Telephone Encounter (Signed)
OK to fill this prescription with additional refills x5 Thank you!  

## 2014-10-07 NOTE — Telephone Encounter (Signed)
Pt's aide calling to get refill for Lorazapam - [facility] [place]. Pt has 1 pill left

## 2014-10-07 NOTE — Telephone Encounter (Signed)
Notified pt/nurse aid refill has been called into [facility]. Spoke with pharmacist Ebony Cargolayton gave md approval.../lmb

## 2014-10-28 DIAGNOSIS — M47816 Spondylosis without myelopathy or radiculopathy, lumbar region: Secondary | ICD-10-CM | POA: Diagnosis not present

## 2014-10-28 DIAGNOSIS — Z79891 Long term (current) use of opiate analgesic: Secondary | ICD-10-CM | POA: Diagnosis not present

## 2014-10-28 DIAGNOSIS — G894 Chronic pain syndrome: Secondary | ICD-10-CM | POA: Diagnosis not present

## 2014-11-09 DIAGNOSIS — M25512 Pain in left shoulder: Secondary | ICD-10-CM | POA: Diagnosis not present

## 2014-11-17 ENCOUNTER — Telehealth: Payer: Self-pay | Admitting: Internal Medicine

## 2014-11-17 NOTE — Telephone Encounter (Signed)
Rec'd from [facility] forward 2 pages to Dr.Plotnikov °

## 2014-11-19 ENCOUNTER — Ambulatory Visit (INDEPENDENT_AMBULATORY_CARE_PROVIDER_SITE_OTHER): Payer: Medicare Other | Admitting: Neurology

## 2014-11-19 ENCOUNTER — Encounter: Payer: Self-pay | Admitting: Neurology

## 2014-11-19 VITALS — BP 110/68 | HR 96 | Wt 130.2 lb

## 2014-11-19 DIAGNOSIS — Z720 Tobacco use: Secondary | ICD-10-CM | POA: Diagnosis not present

## 2014-11-19 DIAGNOSIS — R269 Unspecified abnormalities of gait and mobility: Secondary | ICD-10-CM

## 2014-11-19 DIAGNOSIS — F17200 Nicotine dependence, unspecified, uncomplicated: Secondary | ICD-10-CM

## 2014-11-19 NOTE — Patient Instructions (Signed)
Please try to cut back smoking. We will send a referral to physical therapy for gait training Continue your medications as you are taking them Return to clinic as needed

## 2014-11-19 NOTE — Progress Notes (Signed)
Follow-up Visit   Date: 11/19/2014    [name] MRN: [id number] DOB: 09/20/1951   Interim History: [name] is a 64 y.o. right-handed Caucasian female with history of diabetes mellitus (HbA1c 7.0), septicemia from GI origin with unresponsiveness and possible seizure (1999) complicated by hepatic encephalopathy, watershed infarcts, and anoxic brain injury, prolonged respiratory insufficiency, central pain syndrome, chronic gait disorder, and depression. She has been a long-[facility] of [facility] and first saw me in April 2015 for ongoing problems with gait.  History of present illness: In 1999, she developed sepsis from pancreatis which was complicated possible seizure, watershed stroke with resident left hemiparesis, hepatic encephalopathy, anoxic brain injury, AKI, prolonged respiratory insufficiency s/p trach which as since been removed. She had 4968-month hospitalization and stroke rehab stay. Since her discharge in 1999, she has always been using a wheelchair or walker. She predominately uses a walker and uses a wheelchair only for long distances.   In September 2008 she had a fall and had a left-sided subdural hematoma which was treated conservatively. 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 During 2012, she had continued to have frequent falls and feels as if her feet are stuck to the floor in addition to problems with turning and walking down hallways. She also has a tremor of the right foot. She was started on Sinemet in 2013 which she feels has improved because her feet move better and "they don't feel that they are cemented to the floor". She is able to bathe herself and dress herself. She has caregivers from 9:30-5pm (M-F) and 11-2pm (saturday). She takes her sinemet IR 25/100 TID and extra carbidopa 25mg  in the morning which seems to be working for her.   The frequency of falls has improved to once every two months. Husband attributes this  to poor judgement and not recognizing her own limited. Falls are always at nighttime. She is unable to get back up. Denies any loss of smell, constipation, vivid dreams, muscle stiffness, hallucinations, and lightheadedness.  UPDATE 11/19/2014:  She had a fall in September and broke her left proximal humerus, which was managed with surgery and is doing well now.  She has no new complaints.  She continues to walk with rollator and walker as needed.  She continues to smoke a pack a day.  No new complaints.   Medications:  Current Outpatient Prescriptions on File Prior to Visit  Medication Sig Dispense Refill  . aspirin 81 MG EC tablet Take 81 mg by mouth daily.      . carbidopa-levodopa (SINEMET IR) 25-100 MG per tablet Take 1 tablet by mouth 3 (three) times daily. 270 tablet 3  . cholecalciferol (VITAMIN D) 1000 UNITS tablet Take 1,000 Units by mouth daily.    Marland Kitchen. HYDROmorphone (DILAUDID) 2 MG tablet Take 2-3 tablets (4-6 mg total) by mouth every 6 (six) hours as needed for moderate pain or severe pain. 75 tablet 0  . l-methylfolate-B6-B12 (METANX) 3-35-2 MG TABS Take 1 tablet by mouth 2 (two) times daily. 180 tablet 3  . LORazepam (ATIVAN) 2 MG tablet TAKE ONE-HALF TO ONE TABLET BY MOUTH 3 TIMES A DAY 90 tablet 5  . metFORMIN (GLUCOPHAGE-XR) 500 MG 24 hr tablet Take 500 mg by mouth 3 (three) times daily.    . mirtazapine (REMERON) 15 MG tablet Take 1 tablet (15 mg total) by mouth at bedtime. Take at 6-8 pm 90 tablet 3  . morphine (MS CONTIN) 15 MG  12 hr tablet Take 15 mg by mouth every 12 (twelve) hours.    . promethazine (PHENERGAN) 12.5 MG tablet TAKE 1 TABLET BY MOUTH EVERY 6 HOURS AS NEEDED FOR NAUSEA 60 tablet 3  . sennosides-docusate sodium (SENOKOT-S) 8.6-50 MG tablet Take 2 tablets by mouth daily. 30 tablet 1  . tolterodine (DETROL LA) 4 MG 24 hr capsule Take 4 mg by mouth at bedtime.    Marland Kitchen zolpidem (AMBIEN) 10 MG tablet TAKE 1 TABLET BY MOUTH AT BEDTIME AS NEEDED FOR SLEEP 90 tablet 1    No current facility-administered medications on file prior to visit.    Allergies:  Allergies  Allergen Reactions  . Alendronate Sodium   . Asa [Aspirin] Other (See Comments)    "stomach burning", "I can take baby aspirin"  . Fluoxetine Hcl     Review of Systems:  CONSTITUTIONAL: No fevers, chills, night sweats, or weight loss.  EYES: No visual changes or eye pain ENT: No hearing changes.  No history of nose bleeds.   RESPIRATORY: No cough, wheezing and shortness of breath.   CARDIOVASCULAR: Negative for chest pain, and palpitations.   GI: Negative for abdominal discomfort, blood in stools or black stools.  No recent change in bowel habits.   GU:  No history of incontinence.   MUSCLOSKELETAL: No history of joint pain or swelling.  No myalgias.   SKIN: Negative for lesions, rash, and itching.   ENDOCRINE: Negative for cold or heat intolerance, polydipsia or goiter.   PSYCH:  + depression +anxiety symptoms.   NEURO: As Above.   Vital Signs:  BP 110/68 mmHg  Pulse 96  Wt 130 lb 3 oz (59.053 kg)  SpO2 94%  Neurological Exam: MENTAL STATUS including orientation to time, place, person, recent and remote memory, attention span and concentration, language, and fund of knowledge is fair. Speech is not dysarthric.  CRANIAL NERVES: Pupils equal round and reactive to light.  Normal conjugate, extra-ocular eye movements in all directions of gaze, except slightly limited upward gaze.  No ptosis.  Face is symmetric. Palate elevates symmetrically.  Tongue is midline.  MOTOR:  Motor strength is 5/5 in all extremities.  No pronator drift.  Tone is normal.    MSRs:  Reflexes are 3+/4 throughout, except 2+ at the achilles bilaterally. .  SENSORY:  Intact to vibration throughout.  COORDINATION/GAIT:  Normal finger-to- nose-finger and heel-to-shin.  Intact rapid alternating movements bilaterally.  Gait is wide-based and ataxic with astasia abasia pattern.    IMPRESSION/PLAN: Chronic  gait disorder due to anoxic brain injury (1999). Again, I discussed that I do not find parkinsonian features on exam, but she reports having significant improvement with sinemet so I will continue it for now. Exam is mostly notable for generalized hyperreflexia and ataxic gait with some nonphysiological features.I also discussed that at some point, it would be reasonable to try to taper her medications since she may not necessarily need the dosages she is taking, but she feels very strongly that sinemet helps, so will keep medications as it is at carbidopa-levadopa IR 25/100 three times daily.  She would like to start physical therapy for gait training again, so referral will be sent. Encouraged her to continue home exercises.  Smoking cessation instruction/counseling given:  counseled patient on the dangers of tobacco use, advised patient to stop smoking, and reviewed strategies to maximize success  Return to clinic as needed   The duration of this appointment visit was 30 minutes of face-to-face time with the  patient.  Greater than 50% of this time was spent in counseling, explanation of diagnosis, planning of further management, and coordination of care.   Thank you for allowing me to participate in patient's care.  If I can answer any additional questions, I would be pleased to do so.    Sincerely,    Donika K. Posey Pronto, DO

## 2014-11-25 DIAGNOSIS — G894 Chronic pain syndrome: Secondary | ICD-10-CM | POA: Diagnosis not present

## 2014-11-25 DIAGNOSIS — S42293S Other displaced fracture of upper end of unspecified humerus, sequela: Secondary | ICD-10-CM | POA: Diagnosis not present

## 2014-11-25 DIAGNOSIS — Z79891 Long term (current) use of opiate analgesic: Secondary | ICD-10-CM | POA: Diagnosis not present

## 2014-11-25 DIAGNOSIS — M47816 Spondylosis without myelopathy or radiculopathy, lumbar region: Secondary | ICD-10-CM | POA: Diagnosis not present

## 2014-12-02 ENCOUNTER — Ambulatory Visit: Payer: Medicare Other | Attending: Neurology | Admitting: Physical Therapy

## 2014-12-02 ENCOUNTER — Encounter: Payer: Self-pay | Admitting: Physical Therapy

## 2014-12-02 DIAGNOSIS — F411 Generalized anxiety disorder: Secondary | ICD-10-CM | POA: Insufficient documentation

## 2014-12-02 DIAGNOSIS — M545 Low back pain, unspecified: Secondary | ICD-10-CM

## 2014-12-02 DIAGNOSIS — M25522 Pain in left elbow: Secondary | ICD-10-CM

## 2014-12-02 DIAGNOSIS — M79622 Pain in left upper arm: Secondary | ICD-10-CM | POA: Diagnosis not present

## 2014-12-02 DIAGNOSIS — R269 Unspecified abnormalities of gait and mobility: Secondary | ICD-10-CM | POA: Insufficient documentation

## 2014-12-02 NOTE — Therapy (Signed)
[facility] [address] [place], [place], [address] Phone: [phone]   Fax:  [phone]  Physical Therapy Evaluation  Patient Details  Name: [name] MRN: [id number] Date of Birth: June 14, 1951 Referring Provider:  Tresa GarterPlotnikov, Aleksei V, MD  Encounter Date: 12/02/2014      PT End of Session - 12/02/14 1643    Visit Number 1   Number of Visits 12   Date for PT Re-Evaluation 01/14/15   PT Start Time 1015   PT Stop Time 1100   PT Time Calculation (min) 45 min   Equipment Utilized During Treatment Gait belt   Activity Tolerance Patient tolerated treatment well   Behavior During Therapy Anxious      Past Medical History  Diagnosis Date  . Depression   . GERD (gastroesophageal reflux disease)   . Seizure disorder   . Gait disorder   . Anoxic brain injury   . Chronic neck pain   . Chronic pain in shoulder   . COPD (chronic obstructive pulmonary disease)   . LBP (low back pain)   . Osteoarthritis   . Type II or unspecified type diabetes mellitus without mention of complication, not stated as uncontrolled 2010  . Anxiety   . Vitamin B 12 deficiency 2011  . Hyperlipidemia   . Seizures   . 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 Hernia of abdominal cavity   . Neuropathy   . Fracture of left humerus 06/24/2014    Past Surgical History  Procedure Laterality Date  . Pancreatectomy    . Hernia repair      incisional  . Colonoscopy    . Tonsillectomy    . Tubal ligation    . Diagnostic laparoscopy      PMH: Exploratory lap  . Orif humerus fracture Left 06/24/2014    Procedure: LEFT OPEN REDUCTION INTERNAL FIXATION (ORIF) PROXIMAL HUMERUS FRACTURE;  Surgeon: Eulas PostJoshua P Landau, MD;  Location: [facility] OR;  Service: Orthopedics;  Laterality: Left;    There were no vitals taken for this visit.  Visit Diagnosis:  Abnormality of gait - Plan: PT plan of care  cert/re-cert  Generalized anxiety disorder - Plan: PT plan of care cert/re-cert  Pain in joint, upper arm, left - Plan: PT plan of care cert/re-cert  Midline low back pain without sciatica - Plan: PT plan of care cert/re-cert      Subjective Assessment - 12/02/14 1625    Symptoms Since fall in September and left arm fx/orif repair; walking to bathroom causes a great deal of anxiety; pt is very fearful of falling ;c/o constant left arm pain since her surgery and chronic back pain    Pertinent History Patient has a Scientist, product/process developmentCS worker that stays with her up to 8 hours a day; pt is unable to stand /walk w/o supervision;    How long can you sit comfortably? sits most of the day   How long can you stand comfortably? 1-2 minutes   How long can you walk comfortably? 10' with cga/sba and fww   Patient Stated Goals I want to walk well again so I can get around my home          [facility] - 12/02/14 1015    Precautions   Precautions Fall   Precaution Comments highly dependnet on UE for any standing /walking activity   Balance Screen   Has the patient fallen in the past 6 months Yes  How many times? 1   Has the patient had a decrease in activity level because of a fear of falling?  Yes   Is the patient reluctant to leave their home because of a fear of falling?  Yes   Home Environment   Additional Comments has 8hour PCS worker   Prior Function   Level of Independence Needs assistance with ADLs;Needs assistance with homemaking;Needs assistance with gait;Needs assistance with transfers   Cognition   Behaviors Other (comment)  anxious affect   Observation/Other Assessments   Focus on Therapeutic Outcomes (FOTO)  50  functional status   Activities of Balance Confidence Scale (ABC Scale)  8.8%   Fear Avoidance Belief Questionnaire (FABQ)  47 (13)   Posture/Postural Control   Posture/Postural Control Postural limitations   Postural Limitations Flexed trunk;Forward head   Transfers    Transfers Sit to Stand  sba/cga   Ambulation/Gait   Ambulation/Gait Yes   Ambulation Distance (Feet) 10 Feet   Assistive device Rolling walker   Gait Pattern Decreased step length - right;Decreased step length - left;Festinating;Shuffle;Narrow base of support;Poor foot clearance - left;Poor foot clearance - right   Balance   Balance Assessed Yes   Static Standing Balance   Static Standing - Balance Support Bilateral upper extremity supported           [facility] PT Assessment - 12/02/14 1015    Precautions   Precautions Fall   Precaution Comments highly dependnet on UE for any standing /walking activity   Balance Screen   Has the patient fallen in the past 6 months Yes   How many times? 1   Has the patient had a decrease in activity level because of a fear of falling?  Yes   Is the patient reluctant to leave their home because of a fear of falling?  Yes   Home Environment   Additional Comments has 8hour PCS worker   Prior Function   Level of Independence Needs assistance with ADLs;Needs assistance with homemaking;Needs assistance with gait;Needs assistance with transfers   Cognition   Behaviors Other (comment)  anxious affect   Observation/Other Assessments   Focus on Therapeutic Outcomes (FOTO)  50  functional status   Activities of Balance Confidence Scale (ABC Scale)  8.8%   Fear Avoidance Belief Questionnaire (FABQ)  47 (13)   Posture/Postural Control   Posture/Postural Control Postural limitations   Postural Limitations Flexed trunk;Forward head   Transfers   Transfers Sit to Stand  sba/cga   Ambulation/Gait   Ambulation/Gait Yes   Ambulation Distance (Feet) 10 Feet   Assistive device Rolling walker   Gait Pattern Decreased step length - right;Decreased step length - left;Festinating;Shuffle;Narrow base of support;Poor foot clearance - left;Poor foot clearance - right   Balance   Balance Assessed Yes   Static Standing Balance   Static Standing - Balance Support  Bilateral upper extremity supported                           PT Short Term Goals - 12/02/14 1651    PT SHORT TERM GOAL #1   Title Home exericse program; pt to have a 5-10 minute program she does consistently 4-5 times a day addressing gait deficite and balance deficits   Baseline walks in home   Time 4   Period Weeks   Status New   PT SHORT TERM GOAL #2   Title Patient will be able to walk 115 'on level surface  with FWW with CGA   Baseline walking 10' with min asisst / fww   Time 4   Period Weeks   Status New   PT SHORT TERM GOAL #3   Title Patient will be able to stand  x 10 minutes w/o UE support reporting low level of anxious affect   Baseline high level of anxiety ; needs UE support    Time 4   Period Weeks   Status New           PT Long Term Goals - December 27, 2014 1657    PT LONG TERM GOAL #1   Title Patient and Scientist, product/process development will return demonstrated indpendence with comprehensive  HEP    Time 6   Period Weeks   Status New   PT LONG TERM GOAL #2   Title Patient will be able to walk continuously for 15 minutes with sba using LRAD   Time 6   Period Weeks   Status New   PT LONG TERM GOAL #3   Title Patient will be able to stand and step to turn in space during ADLs w/o need of UE support w/o loss of balance or excessive anxious affect.   Time 6   Period Weeks   Status New               Plan - 12-27-2014 1645    Clinical Impression Statement Patient has good strength when tested individual movt in LE and UE   4/5 - 5/5 throughout and she can move her arms and legs as needed while seated; with standing she has significant difficulty with any single leg stance activity ;Since fall in September and left arm fx/orif repair; walking to bathroom causes a great deal of anxiety; pt is very fearful of falling; presents with festinating gait pattern/ poor abillity to stand on single leg during gait; she shuffles her feet; and going through a transition (ie.  doorway ) she has freezing episode/  She will benefit from PT / ther ex to improve her gait and transfer ability and lower her fall risk   Pt will benefit from skilled therapeutic intervention in order to improve on the following deficits Abnormal gait;Decreased balance;Decreased endurance;Decreased strength   Rehab Potential Fair   Clinical Impairments Affecting Rehab Potential anxiety; left arm/shoulder pain; generalized weakness and parkinsonism gait pattern   PT Frequency 2x / week   PT Duration 6 weeks   PT Treatment/Interventions Gait training;Balance training;Therapeutic exercise;Therapeutic activities   PT Next Visit Plan Develop HEP that she can do with aide several times a day; follow up from todays recommendations   PT Home Exercise Plan Overall have her doing ther ex in short duration mulitple times a day   Consulted and Agree with Plan of Care Patient;Family member/caregiver   Family Member Consulted PCS worker          G-Codes - Dec 27, 2014 1708    Functional Assessment Tool Used Sit to stand mod assist ; standing balance holding rolling walker with mod asisst    Functional Limitation Changing and maintaining body position   Changing and Maintaining Body Position Current Status [id number]) At least 80 percent but less than 100 percent impaired, limited or restricted   Changing and Maintaining Body Position Goal Status (U0454) At least 40 percent but less than 60 percent impaired, limited or restricted       Problem List Patient Active Problem List   Diagnosis Date Noted  . Fracture of left humerus 06/24/2014  .  Humerus fracture 06/24/2014  . Chest wall contusion 05/03/2014  . Acute bronchitis 11/10/2013  . Left elbow pain 11/03/2013  . Right facial pain 06/19/2013  . Ankle ulcer 04/02/2012  . Nausea & vomiting 10/05/2011  . Decubitus ulcer 06/26/2011  . Decubitus ulcer of coccyx 05/21/2011  . Cellulitis and abscess of other specified site 10/11/2010  . VITAMIN B12  DEFICIENCY 04/04/2010  . HYPERLIPIDEMIA 04/04/2010  . ANXIETY 11/14/2009  . Unspecified vitamin D deficiency 10/21/2009  . MELENA 10/21/2009  . DIABETES MELLITUS, TYPE II 07/07/2009  . FEVER, RECURRENT 06/21/2009  . COPD 02/25/2009  . OSTEOARTHRITIS 02/25/2009  . LOW BACK PAIN 02/25/2009  . TOBACCO USER 01/13/2009  . Rash and other nonspecific skin eruption 01/13/2009  . FURUNCLE 06/08/2008  . DEPRESSION 02/26/2007  . PAIN, CHRONIC NEC 02/26/2007  . VENOUS INSUFFICIENCY, LEGS 02/26/2007  . GERD 02/26/2007  . HERNIA, INCISIONAL 02/26/2007  . SHOULDER PAIN, LEFT 02/26/2007  . SEIZURE DISORDER 02/26/2007  . INSOMNIA 02/26/2007  . Abnormality of gait 02/26/2007  . LEG EDEMA, CHRONIC 02/26/2007    Vashti Hey D DPT 12/03/2014, 8:17 AM  [facility] [address] [place], [place], [address] Phone: [phone]   Fax:  [phone]

## 2014-12-02 NOTE — Patient Instructions (Signed)
Instructed patient and aid to work on sit to stand in the place in her home that she fell;and help decrease her anxiety with the association between the fall and the location

## 2014-12-08 ENCOUNTER — Encounter: Payer: Self-pay | Admitting: Internal Medicine

## 2014-12-08 ENCOUNTER — Ambulatory Visit (INDEPENDENT_AMBULATORY_CARE_PROVIDER_SITE_OTHER): Payer: Medicare Other | Admitting: Internal Medicine

## 2014-12-08 VITALS — BP 140/82 | HR 101 | Temp 99.0°F | Ht 65.0 in | Wt 130.0 lb

## 2014-12-08 DIAGNOSIS — Z0000 Encounter for general adult medical examination without abnormal findings: Secondary | ICD-10-CM

## 2014-12-08 DIAGNOSIS — E118 Type 2 diabetes mellitus with unspecified complications: Secondary | ICD-10-CM | POA: Diagnosis not present

## 2014-12-08 DIAGNOSIS — E538 Deficiency of other specified B group vitamins: Secondary | ICD-10-CM | POA: Diagnosis not present

## 2014-12-08 DIAGNOSIS — Z23 Encounter for immunization: Secondary | ICD-10-CM | POA: Diagnosis not present

## 2014-12-08 DIAGNOSIS — E559 Vitamin D deficiency, unspecified: Secondary | ICD-10-CM | POA: Diagnosis not present

## 2014-12-08 DIAGNOSIS — F17200 Nicotine dependence, unspecified, uncomplicated: Secondary | ICD-10-CM

## 2014-12-08 MED ORDER — ZOLPIDEM TARTRATE 10 MG PO TABS: 10.0000 mg | ORAL_TABLET | Freq: Every evening | ORAL | Status: DC | PRN

## 2014-12-08 NOTE — Assessment & Plan Note (Signed)
Labs On Metformin 

## 2014-12-08 NOTE — Assessment & Plan Note (Signed)
Vit D level.

## 2014-12-08 NOTE — Assessment & Plan Note (Signed)
Discussed.

## 2014-12-08 NOTE — Assessment & Plan Note (Signed)
Labs ?on Rx

## 2014-12-08 NOTE — Assessment & Plan Note (Signed)
Here for medicare wellness/physical  Diet: heart healthy and ADA Physical activity: sedentary - w/c Depression/mood screen: negative  Hearing: intact to whispered voice  Visual acuity: grossly normal, performs annual eye exam  ADLs: capable  Fall risk: high  Home safety: good  Cognitive evaluation: intact to orientation, naming, recall and repetition  EOL planning: adv directives, full code/ I agree  I have personally reviewed and have noted  1. The patient's medical, surgical and social history  2. Their use of alcohol, tobacco or illicit drugs  3. Their current medications and supplements  4. The patient's functional ability including ADL's, fall risks, home safety risks and hearing or visual impairment.  5. Diet and physical activities  6. Evidence for depression or mood disorders    Today patient counseled on age appropriate routine health concerns for screening and prevention, each reviewed and up to date or declined. Immunizations reviewed and up to date or declined. Labs ordered and reviewed. Risk factors for depression reviewed and negative. Hearing function and visual acuity are intact. ADLs screened and addressed as needed. Functional ability and level of safety reviewed and appropriate. Education, counseling and referrals performed based on assessed risks today. Patient provided with a copy of personalized plan for preventive services.   Smoking 1 ppd - discussed

## 2014-12-08 NOTE — Progress Notes (Signed)
Pre visit review using our clinic review tool, if applicable. No additional management support is needed unless otherwise documented below in the visit note. 

## 2014-12-08 NOTE — Progress Notes (Signed)
   Subjective:    HPI  F/u R outer ankle sore x 12 mo - healing. She has been hitting R ankle on a wooden part of her recliner - better  Smoking 1/2 ppd  The patient presents for a follow-up of  chronic hypertension, chronic dyslipidemia, type 2 diabetes controlled with medicines  F/u wt loss  better  Wt Readings from Last 3 Encounters:  12/08/14 130 lb (58.968 kg)  11/19/14 130 lb 3 oz (59.053 kg)  06/24/14 133 lb (60.328 kg)   BP Readings from Last 3 Encounters:  12/08/14 140/82  11/19/14 110/68  06/25/14 135/60     Review of Systems  Constitutional: Positive for fatigue. Negative for activity change, appetite change and unexpected weight change.  HENT: Negative for congestion, mouth sores and sinus pressure.   Eyes: Negative for visual disturbance.  Respiratory: Negative for chest tightness.   Gastrointestinal: Negative for nausea.  Genitourinary: Negative for frequency, difficulty urinating and vaginal pain.  Musculoskeletal: Positive for gait problem. Negative for back pain.  Skin: Positive for wound (R ankle). Negative for pallor and rash.  Neurological: Negative for tremors, weakness and numbness.  Psychiatric/Behavioral: Positive for sleep disturbance. Negative for suicidal ideas and confusion. The patient is nervous/anxious.        Objective:   Physical Exam  Constitutional: She appears well-developed and well-nourished. No distress.  HENT:  Head: Normocephalic.  Right Ear: External ear normal.  Left Ear: External ear normal.  Nose: Nose normal.  Mouth/Throat: Oropharynx is clear and moist.  Eyes: Conjunctivae are normal. Pupils are equal, round, and reactive to light. Right eye exhibits no discharge. Left eye exhibits no discharge.  Neck: Normal range of motion. Neck supple. No JVD present. No tracheal deviation present. No thyromegaly present.  Cardiovascular: Normal rate, regular rhythm and normal heart sounds.   Pulmonary/Chest: No stridor. No  respiratory distress. She has no wheezes.  Abdominal: Soft. Bowel sounds are normal. She exhibits no distension and no mass. There is no tenderness. There is no rebound and no guarding.  Musculoskeletal: She exhibits no edema or tenderness.  Lymphadenopathy:    She has no cervical adenopathy.  Neurological: She displays abnormal reflex. No cranial nerve deficit. She exhibits abnormal muscle tone. Coordination abnormal.  w/c  Skin: No rash noted. No erythema.  R lat malleolus ulcer - healed  Psychiatric: She has a normal mood and affect. Her behavior is normal. Judgment and thought content normal.  R popl art pulse is good Ankle arteries pulse is diminished some R foot w/trace edema  Lab Results  Component Value Date   WBC 8.0 06/25/2014   HGB 12.2 06/25/2014   HCT 37.3 06/25/2014   PLT 193 06/25/2014   GLUCOSE 136* 06/25/2014   CHOL 228* 10/26/2013   TRIG 98.0 10/26/2013   HDL 109.70 10/26/2013   LDLDIRECT 100.8 10/26/2013   ALT <5 06/24/2014   AST 23 06/24/2014   NA 139 06/25/2014   K 4.2 06/25/2014   CL 101 06/25/2014   CREATININE 0.39* 06/25/2014   BUN 7 06/25/2014   CO2 25 06/25/2014   TSH 1.75 10/26/2013   INR 1.0 ratio 10/21/2009   HGBA1C 6.7* 01/27/2014    R lat ankle ulcer ishealed        Assessment & Plan:

## 2014-12-08 NOTE — Patient Instructions (Signed)
Preventive Care for Adults A healthy lifestyle and preventive care can promote health and wellness. Preventive health guidelines for women include the following key practices.  A routine yearly physical is a good way to check with your health care provider about your health and preventive screening. It is a chance to share any concerns and updates on your health and to receive a thorough exam.  Visit your dentist for a routine exam and preventive care every 6 months. Brush your teeth twice a day and floss once a day. Good oral hygiene prevents tooth decay and gum disease.  The frequency of eye exams is based on your age, health, family medical history, use of contact lenses, and other factors. Follow your health care provider's recommendations for frequency of eye exams.  Eat a healthy diet. Foods like vegetables, fruits, whole grains, low-fat dairy products, and lean protein foods contain the nutrients you need without too many calories. Decrease your intake of foods high in solid fats, added sugars, and salt. Eat the right amount of calories for you.Get information about a proper diet from your health care provider, if necessary.  Regular physical exercise is one of the most important things you can do for your health. Most adults should get at least 150 minutes of moderate-intensity exercise (any activity that increases your heart rate and causes you to sweat) each week. In addition, most adults need muscle-strengthening exercises on 2 or more days a week.  Maintain a healthy weight. The body mass index (BMI) is a screening tool to identify possible weight problems. It provides an estimate of body fat based on height and weight. Your health care provider can find your BMI and can help you achieve or maintain a healthy weight.For adults 20 years and older:  A BMI below 18.5 is considered underweight.  A BMI of 18.5 to 24.9 is normal.  A BMI of 25 to 29.9 is considered overweight.  A BMI of  30 and above is considered obese.  Maintain normal blood lipids and cholesterol levels by exercising and minimizing your intake of saturated fat. Eat a balanced diet with plenty of fruit and vegetables. Blood tests for lipids and cholesterol should begin at age 76 and be repeated every 5 years. If your lipid or cholesterol levels are high, you are over 50, or you are at high risk for heart disease, you may need your cholesterol levels checked more frequently.Ongoing high lipid and cholesterol levels should be treated with medicines if diet and exercise are not working.  If you smoke, find out from your health care provider how to quit. If you do not use tobacco, do not start.  Lung cancer screening is recommended for adults aged 22-80 years who are at high risk for developing lung cancer because of a history of smoking. A yearly low-dose CT scan of the lungs is recommended for people who have at least a 30-pack-year history of smoking and are a current smoker or have quit within the past 15 years. A pack year of smoking is smoking an average of 1 pack of cigarettes a day for 1 year (for example: 1 pack a day for 30 years or 2 packs a day for 15 years). Yearly screening should continue until the smoker has stopped smoking for at least 15 years. Yearly screening should be stopped for people who develop a health problem that would prevent them from having lung cancer treatment.  If you are pregnant, do not drink alcohol. If you are breastfeeding,  be very cautious about drinking alcohol. If you are not pregnant and choose to drink alcohol, do not have more than 1 drink per day. One drink is considered to be 12 ounces (355 mL) of beer, 5 ounces (148 mL) of wine, or 1.5 ounces (44 mL) of liquor.  Avoid use of street drugs. Do not share needles with anyone. Ask for help if you need support or instructions about stopping the use of drugs.  High blood pressure causes heart disease and increases the risk of  stroke. Your blood pressure should be checked at least every 1 to 2 years. Ongoing high blood pressure should be treated with medicines if weight loss and exercise do not work.  If you are 75-52 years old, ask your health care provider if you should take aspirin to prevent strokes.  Diabetes screening involves taking a blood sample to check your fasting blood sugar level. This should be done once every 3 years, after age 15, if you are within normal weight and without risk factors for diabetes. Testing should be considered at a younger age or be carried out more frequently if you are overweight and have at least 1 risk factor for diabetes.  Breast cancer screening is essential preventive care for women. You should practice "breast self-awareness." This means understanding the normal appearance and feel of your breasts and may include breast self-examination. Any changes detected, no matter how small, should be reported to a health care provider. Women in their 58s and 30s should have a clinical breast exam (CBE) by a health care provider as part of a regular health exam every 1 to 3 years. After age 16, women should have a CBE every year. Starting at age 53, women should consider having a mammogram (breast X-ray test) every year. Women who have a family history of breast cancer should talk to their health care provider about genetic screening. Women at a high risk of breast cancer should talk to their health care providers about having an MRI and a mammogram every year.  Breast cancer gene (BRCA)-related cancer risk assessment is recommended for women who have family members with BRCA-related cancers. BRCA-related cancers include breast, ovarian, tubal, and peritoneal cancers. Having family members with these cancers may be associated with an increased risk for harmful changes (mutations) in the breast cancer genes BRCA1 and BRCA2. Results of the assessment will determine the need for genetic counseling and  BRCA1 and BRCA2 testing.  Routine pelvic exams to screen for cancer are no longer recommended for nonpregnant women who are considered low risk for cancer of the pelvic organs (ovaries, uterus, and vagina) and who do not have symptoms. Ask your health care provider if a screening pelvic exam is right for you.  If you have had past treatment for cervical cancer or a condition that could lead to cancer, you need Pap tests and screening for cancer for at least 20 years after your treatment. If Pap tests have been discontinued, your risk factors (such as having a new sexual partner) need to be reassessed to determine if screening should be resumed. Some women have medical problems that increase the chance of getting cervical cancer. In these cases, your health care provider may recommend more frequent screening and Pap tests.  The HPV test is an additional test that may be used for cervical cancer screening. The HPV test looks for the virus that can cause the cell changes on the cervix. The cells collected during the Pap test can be  tested for HPV. The HPV test could be used to screen women aged 30 years and older, and should be used in women of any age who have unclear Pap test results. After the age of 30, women should have HPV testing at the same frequency as a Pap test.  Colorectal cancer can be detected and often prevented. Most routine colorectal cancer screening begins at the age of 50 years and continues through age 75 years. However, your health care provider may recommend screening at an earlier age if you have risk factors for colon cancer. On a yearly basis, your health care provider may provide home test kits to check for hidden blood in the stool. Use of a small camera at the end of a tube, to directly examine the colon (sigmoidoscopy or colonoscopy), can detect the earliest forms of colorectal cancer. Talk to your health care provider about this at age 50, when routine screening begins. Direct  exam of the colon should be repeated every 5-10 years through age 75 years, unless early forms of pre-cancerous polyps or small growths are found.  People who are at an increased risk for hepatitis B should be screened for this virus. You are considered at high risk for hepatitis B if:  You were born in a country where hepatitis B occurs often. Talk with your health care provider about which countries are considered high risk.  Your parents were born in a high-risk country and you have not received a shot to protect against hepatitis B (hepatitis B vaccine).  You have HIV or AIDS.  You use needles to inject street drugs.  You live with, or have sex with, someone who has hepatitis B.  You get hemodialysis treatment.  You take certain medicines for conditions like cancer, organ transplantation, and autoimmune conditions.  Hepatitis C blood testing is recommended for all people born from 1945 through 1965 and any individual with known risks for hepatitis C.  Practice safe sex. Use condoms and avoid high-risk sexual practices to reduce the spread of sexually transmitted infections (STIs). STIs include gonorrhea, chlamydia, syphilis, trichomonas, herpes, HPV, and human immunodeficiency virus (HIV). Herpes, HIV, and HPV are viral illnesses that have no cure. They can result in disability, cancer, and death.  You should be screened for sexually transmitted illnesses (STIs) including gonorrhea and chlamydia if:  You are sexually active and are younger than 24 years.  You are older than 24 years and your health care provider tells you that you are at risk for this type of infection.  Your sexual activity has changed since you were last screened and you are at an increased risk for chlamydia or gonorrhea. Ask your health care provider if you are at risk.  If you are at risk of being infected with HIV, it is recommended that you take a prescription medicine daily to prevent HIV infection. This is  called preexposure prophylaxis (PrEP). You are considered at risk if:  You are a heterosexual woman, are sexually active, and are at increased risk for HIV infection.  You take drugs by injection.  You are sexually active with a partner who has HIV.  Talk with your health care provider about whether you are at high risk of being infected with HIV. If you choose to begin PrEP, you should first be tested for HIV. You should then be tested every 3 months for as long as you are taking PrEP.  Osteoporosis is a disease in which the bones lose minerals and strength   with aging. This can result in serious bone fractures or breaks. The risk of osteoporosis can be identified using a bone density scan. Women ages 65 years and over and women at risk for fractures or osteoporosis should discuss screening with their health care providers. Ask your health care provider whether you should take a calcium supplement or vitamin D to reduce the rate of osteoporosis.  Menopause can be associated with physical symptoms and risks. Hormone replacement therapy is available to decrease symptoms and risks. You should talk to your health care provider about whether hormone replacement therapy is right for you.  Use sunscreen. Apply sunscreen liberally and repeatedly throughout the day. You should seek shade when your shadow is shorter than you. Protect yourself by wearing long sleeves, pants, a wide-brimmed hat, and sunglasses year round, whenever you are outdoors.  Once a month, do a whole body skin exam, using a mirror to look at the skin on your back. Tell your health care provider of new moles, moles that have irregular borders, moles that are larger than a pencil eraser, or moles that have changed in shape or color.  Stay current with required vaccines (immunizations).  Influenza vaccine. All adults should be immunized every year.  Tetanus, diphtheria, and acellular pertussis (Td, Tdap) vaccine. Pregnant women should  receive 1 dose of Tdap vaccine during each pregnancy. The dose should be obtained regardless of the length of time since the last dose. Immunization is preferred during the 27th-36th week of gestation. An adult who has not previously received Tdap or who does not know her vaccine status should receive 1 dose of Tdap. This initial dose should be followed by tetanus and diphtheria toxoids (Td) booster doses every 10 years. Adults with an unknown or incomplete history of completing a 3-dose immunization series with Td-containing vaccines should begin or complete a primary immunization series including a Tdap dose. Adults should receive a Td booster every 10 years.  Varicella vaccine. An adult without evidence of immunity to varicella should receive 2 doses or a second dose if she has previously received 1 dose. Pregnant females who do not have evidence of immunity should receive the first dose after pregnancy. This first dose should be obtained before leaving the health care facility. The second dose should be obtained 4-8 weeks after the first dose.  Human papillomavirus (HPV) vaccine. Females aged 13-26 years who have not received the vaccine previously should obtain the 3-dose series. The vaccine is not recommended for use in pregnant females. However, pregnancy testing is not needed before receiving a dose. If a female is found to be pregnant after receiving a dose, no treatment is needed. In that case, the remaining doses should be delayed until after the pregnancy. Immunization is recommended for any person with an immunocompromised condition through the age of 26 years if she did not get any or all doses earlier. During the 3-dose series, the second dose should be obtained 4-8 weeks after the first dose. The third dose should be obtained 24 weeks after the first dose and 16 weeks after the second dose.  Zoster vaccine. One dose is recommended for adults aged 60 years or older unless certain conditions are  present.  Measles, mumps, and rubella (MMR) vaccine. Adults born before 1957 generally are considered immune to measles and mumps. Adults born in 1957 or later should have 1 or more doses of MMR vaccine unless there is a contraindication to the vaccine or there is laboratory evidence of immunity to   each of the three diseases. A routine second dose of MMR vaccine should be obtained at least 28 days after the first dose for students attending postsecondary schools, health care workers, or international travelers. People who received inactivated measles vaccine or an unknown type of measles vaccine during 1963-1967 should receive 2 doses of MMR vaccine. People who received inactivated mumps vaccine or an unknown type of mumps vaccine before 1979 and are at high risk for mumps infection should consider immunization with 2 doses of MMR vaccine. For females of childbearing age, rubella immunity should be determined. If there is no evidence of immunity, females who are not pregnant should be vaccinated. If there is no evidence of immunity, females who are pregnant should delay immunization until after pregnancy. Unvaccinated health care workers born before 1957 who lack laboratory evidence of measles, mumps, or rubella immunity or laboratory confirmation of disease should consider measles and mumps immunization with 2 doses of MMR vaccine or rubella immunization with 1 dose of MMR vaccine.  Pneumococcal 13-valent conjugate (PCV13) vaccine. When indicated, a person who is uncertain of her immunization history and has no record of immunization should receive the PCV13 vaccine. An adult aged 19 years or older who has certain medical conditions and has not been previously immunized should receive 1 dose of PCV13 vaccine. This PCV13 should be followed with a dose of pneumococcal polysaccharide (PPSV23) vaccine. The PPSV23 vaccine dose should be obtained at least 8 weeks after the dose of PCV13 vaccine. An adult aged 19  years or older who has certain medical conditions and previously received 1 or more doses of PPSV23 vaccine should receive 1 dose of PCV13. The PCV13 vaccine dose should be obtained 1 or more years after the last PPSV23 vaccine dose.  Pneumococcal polysaccharide (PPSV23) vaccine. When PCV13 is also indicated, PCV13 should be obtained first. All adults aged 65 years and older should be immunized. An adult younger than age 65 years who has certain medical conditions should be immunized. Any person who resides in a nursing home or long-term care facility should be immunized. An adult smoker should be immunized. People with an immunocompromised condition and certain other conditions should receive both PCV13 and PPSV23 vaccines. People with human immunodeficiency virus (HIV) infection should be immunized as soon as possible after diagnosis. Immunization during chemotherapy or radiation therapy should be avoided. Routine use of PPSV23 vaccine is not recommended for American Indians, [place] Natives, or people younger than 65 years unless there are medical conditions that require PPSV23 vaccine. When indicated, people who have unknown immunization and have no record of immunization should receive PPSV23 vaccine. One-time revaccination 5 years after the first dose of PPSV23 is recommended for people aged 19-64 years who have chronic kidney failure, nephrotic syndrome, asplenia, or immunocompromised conditions. People who received 1-2 doses of PPSV23 before age 65 years should receive another dose of PPSV23 vaccine at age 65 years or later if at least 5 years have passed since the previous dose. Doses of PPSV23 are not needed for people immunized with PPSV23 at or after age 65 years.  Meningococcal vaccine. Adults with asplenia or persistent complement component deficiencies should receive 2 doses of quadrivalent meningococcal conjugate (MenACWY-D) vaccine. The doses should be obtained at least 2 months apart.  Microbiologists working with certain meningococcal bacteria, military recruits, people at risk during an outbreak, and people who travel to or live in countries with a high rate of meningitis should be immunized. A first-year college student up through age   21 years who is living in a residence hall should receive a dose if she did not receive a dose on or after her 16th birthday. Adults who have certain high-risk conditions should receive one or more doses of vaccine.  Hepatitis A vaccine. Adults who wish to be protected from this disease, have certain high-risk conditions, work with hepatitis A-infected animals, work in hepatitis A research labs, or travel to or work in countries with a high rate of hepatitis A should be immunized. Adults who were previously unvaccinated and who anticipate close contact with an international adoptee during the first 60 days after arrival in the [place] from a country with a high rate of hepatitis A should be immunized.  Hepatitis B vaccine. Adults who wish to be protected from this disease, have certain high-risk conditions, may be exposed to blood or other infectious body fluids, are household contacts or sex partners of hepatitis B positive people, are clients or workers in certain care facilities, or travel to or work in countries with a high rate of hepatitis B should be immunized.  Haemophilus influenzae type b (Hib) vaccine. A previously unvaccinated person with asplenia or sickle cell disease or having a scheduled splenectomy should receive 1 dose of Hib vaccine. Regardless of previous immunization, a recipient of a hematopoietic stem cell transplant should receive a 3-dose series 6-12 months after her successful transplant. Hib vaccine is not recommended for adults with HIV infection. Preventive Services / Frequency Ages 64 to 68 years  Blood pressure check.** / Every 1 to 2 years.  Lipid and cholesterol check.** / Every 5 years beginning at age  22.  Clinical breast exam.** / Every 3 years for women in their 88s and 53s.  BRCA-related cancer risk assessment.** / For women who have family members with a BRCA-related cancer (breast, ovarian, tubal, or peritoneal cancers).  Pap test.** / Every 2 years from ages 90 through 51. Every 3 years starting at age 21 through age 56 or 3 with a history of 3 consecutive normal Pap tests.  HPV screening.** / Every 3 years from ages 24 through ages 1 to 46 with a history of 3 consecutive normal Pap tests.  Hepatitis C blood test.** / For any individual with known risks for hepatitis C.  Skin self-exam. / Monthly.  Influenza vaccine. / Every year.  Tetanus, diphtheria, and acellular pertussis (Tdap, Td) vaccine.** / Consult your health care provider. Pregnant women should receive 1 dose of Tdap vaccine during each pregnancy. 1 dose of Td every 10 years.  Varicella vaccine.** / Consult your health care provider. Pregnant females who do not have evidence of immunity should receive the first dose after pregnancy.  HPV vaccine. / 3 doses over 6 months, if 72 and younger. The vaccine is not recommended for use in pregnant females. However, pregnancy testing is not needed before receiving a dose.  Measles, mumps, rubella (MMR) vaccine.** / You need at least 1 dose of MMR if you were born in 1957 or later. You may also need a 2nd dose. For females of childbearing age, rubella immunity should be determined. If there is no evidence of immunity, females who are not pregnant should be vaccinated. If there is no evidence of immunity, females who are pregnant should delay immunization until after pregnancy.  Pneumococcal 13-valent conjugate (PCV13) vaccine.** / Consult your health care provider.  Pneumococcal polysaccharide (PPSV23) vaccine.** / 1 to 2 doses if you smoke cigarettes or if you have certain conditions.  Meningococcal vaccine.** /  1 dose if you are age 19 to 21 years and a first-year college  student living in a residence hall, or have one of several medical conditions, you need to get vaccinated against meningococcal disease. You may also need additional booster doses.  Hepatitis A vaccine.** / Consult your health care provider.  Hepatitis B vaccine.** / Consult your health care provider.  Haemophilus influenzae type b (Hib) vaccine.** / Consult your health care provider. Ages 40 to 64 years  Blood pressure check.** / Every 1 to 2 years.  Lipid and cholesterol check.** / Every 5 years beginning at age 20 years.  Lung cancer screening. / Every year if you are aged 55-80 years and have a 30-pack-year history of smoking and currently smoke or have quit within the past 15 years. Yearly screening is stopped once you have quit smoking for at least 15 years or develop a health problem that would prevent you from having lung cancer treatment.  Clinical breast exam.** / Every year after age 40 years.  BRCA-related cancer risk assessment.** / For women who have family members with a BRCA-related cancer (breast, ovarian, tubal, or peritoneal cancers).  Mammogram.** / Every year beginning at age 40 years and continuing for as long as you are in good health. Consult with your health care provider.  Pap test.** / Every 3 years starting at age 30 years through age 65 or 70 years with a history of 3 consecutive normal Pap tests.  HPV screening.** / Every 3 years from ages 30 years through ages 65 to 70 years with a history of 3 consecutive normal Pap tests.  Fecal occult blood test (FOBT) of stool. / Every year beginning at age 50 years and continuing until age 75 years. You may not need to do this test if you get a colonoscopy every 10 years.  Flexible sigmoidoscopy or colonoscopy.** / Every 5 years for a flexible sigmoidoscopy or every 10 years for a colonoscopy beginning at age 50 years and continuing until age 75 years.  Hepatitis C blood test.** / For all people born from 1945 through  1965 and any individual with known risks for hepatitis C.  Skin self-exam. / Monthly.  Influenza vaccine. / Every year.  Tetanus, diphtheria, and acellular pertussis (Tdap/Td) vaccine.** / Consult your health care provider. Pregnant women should receive 1 dose of Tdap vaccine during each pregnancy. 1 dose of Td every 10 years.  Varicella vaccine.** / Consult your health care provider. Pregnant females who do not have evidence of immunity should receive the first dose after pregnancy.  Zoster vaccine.** / 1 dose for adults aged 60 years or older.  Measles, mumps, rubella (MMR) vaccine.** / You need at least 1 dose of MMR if you were born in 1957 or later. You may also need a 2nd dose. For females of childbearing age, rubella immunity should be determined. If there is no evidence of immunity, females who are not pregnant should be vaccinated. If there is no evidence of immunity, females who are pregnant should delay immunization until after pregnancy.  Pneumococcal 13-valent conjugate (PCV13) vaccine.** / Consult your health care provider.  Pneumococcal polysaccharide (PPSV23) vaccine.** / 1 to 2 doses if you smoke cigarettes or if you have certain conditions.  Meningococcal vaccine.** / Consult your health care provider.  Hepatitis A vaccine.** / Consult your health care provider.  Hepatitis B vaccine.** / Consult your health care provider.  Haemophilus influenzae type b (Hib) vaccine.** / Consult your health care provider. Ages 65   years and over  Blood pressure check.** / Every 1 to 2 years.  Lipid and cholesterol check.** / Every 5 years beginning at age 22 years.  Lung cancer screening. / Every year if you are aged 73-80 years and have a 30-pack-year history of smoking and currently smoke or have quit within the past 15 years. Yearly screening is stopped once you have quit smoking for at least 15 years or develop a health problem that would prevent you from having lung cancer  treatment.  Clinical breast exam.** / Every year after age 4 years.  BRCA-related cancer risk assessment.** / For women who have family members with a BRCA-related cancer (breast, ovarian, tubal, or peritoneal cancers).  Mammogram.** / Every year beginning at age 40 years and continuing for as long as you are in good health. Consult with your health care provider.  Pap test.** / Every 3 years starting at age 9 years through age 34 or 91 years with 3 consecutive normal Pap tests. Testing can be stopped between 65 and 70 years with 3 consecutive normal Pap tests and no abnormal Pap or HPV tests in the past 10 years.  HPV screening.** / Every 3 years from ages 57 years through ages 64 or 45 years with a history of 3 consecutive normal Pap tests. Testing can be stopped between 65 and 70 years with 3 consecutive normal Pap tests and no abnormal Pap or HPV tests in the past 10 years.  Fecal occult blood test (FOBT) of stool. / Every year beginning at age 15 years and continuing until age 17 years. You may not need to do this test if you get a colonoscopy every 10 years.  Flexible sigmoidoscopy or colonoscopy.** / Every 5 years for a flexible sigmoidoscopy or every 10 years for a colonoscopy beginning at age 86 years and continuing until age 71 years.  Hepatitis C blood test.** / For all people born from [birth year] through 1966 and any individual with known risks for hepatitis C.  Osteoporosis screening.** / A one-time screening for women ages 83 years and over and women at risk for fractures or osteoporosis.  Skin self-exam. / Monthly.  Influenza vaccine. / Every year.  Tetanus, diphtheria, and acellular pertussis (Tdap/Td) vaccine.** / 1 dose of Td every 10 years.  Varicella vaccine.** / Consult your health care provider.  Zoster vaccine.** / 1 dose for adults aged 61 years or older.  Pneumococcal 13-valent conjugate (PCV13) vaccine.** / Consult your health care provider.  Pneumococcal  polysaccharide (PPSV23) vaccine.** / 1 dose for all adults aged 28 years and older.  Meningococcal vaccine.** / Consult your health care provider.  Hepatitis A vaccine.** / Consult your health care provider.  Hepatitis B vaccine.** / Consult your health care provider.  Haemophilus influenzae type b (Hib) vaccine.** / Consult your health care provider. ** Family history and personal history of risk and conditions may change your health care provider's recommendations. Document Released: 11/13/2001 Document Revised: 02/01/2014 Document Reviewed: 02/12/2011 [facility] Patient Information 2015 [place], [place]. This information is not intended to replace advice given to you by your health care provider. Make sure you discuss any questions you have with your health care provider.

## 2014-12-09 ENCOUNTER — Telehealth: Payer: Self-pay | Admitting: Internal Medicine

## 2014-12-09 NOTE — Telephone Encounter (Signed)
emmi emailed °

## 2014-12-10 ENCOUNTER — Telehealth: Payer: Self-pay | Admitting: Internal Medicine

## 2014-12-10 NOTE — Telephone Encounter (Signed)
Herbert SetaHeather has questions regarding checking blood sugar for patient. Please contact her,.

## 2014-12-10 NOTE — Telephone Encounter (Signed)
Herbert SetaHeather is asking how often pt should be checcking CBGs. Per MD I informed her check CBGs qd.

## 2014-12-14 ENCOUNTER — Ambulatory Visit: Payer: Medicare Other | Admitting: Physical Therapy

## 2014-12-14 ENCOUNTER — Encounter: Payer: Self-pay | Admitting: Physical Therapy

## 2014-12-14 DIAGNOSIS — R269 Unspecified abnormalities of gait and mobility: Secondary | ICD-10-CM | POA: Diagnosis not present

## 2014-12-14 DIAGNOSIS — F411 Generalized anxiety disorder: Secondary | ICD-10-CM | POA: Diagnosis not present

## 2014-12-14 DIAGNOSIS — M25522 Pain in left elbow: Secondary | ICD-10-CM

## 2014-12-14 DIAGNOSIS — R2689 Other abnormalities of gait and mobility: Secondary | ICD-10-CM

## 2014-12-14 DIAGNOSIS — M545 Low back pain, unspecified: Secondary | ICD-10-CM

## 2014-12-14 DIAGNOSIS — M79622 Pain in left upper arm: Secondary | ICD-10-CM | POA: Diagnosis not present

## 2014-12-14 NOTE — Patient Instructions (Signed)
Exercises: 1. Stand up & sit down 10 times. 2. Stand with buttocks against counter : push back up straight, take 2 deep breaths, then relax, do 10 times 3. Stand with buttocks against counter: push back up straight turn to look over right shoulder, push up straight turn to look over left shoulder, Repeat 10 times to right & left side  HIP: Hamstrings - Short Sitting   Rest leg on raised surface or second chair. Keep knee straight. Lift chest. Sit with right leg up 3 minutes. Repeat with left leg. _1-2__ reps per set, _2-3__ sets per day, __7_ days per week  Copyright  VHI. All rights reserved.  Gastroc / Heel Cord Stretch - Seated With Towel   Sit at edge of chair with foot on floor with knee straight, towel around ball of foot. Gently pull foot in toward body, stretching heel cord and calf. Hold for _10-15__ seconds. Repeat on involved leg. Repeat _3__ times on each leg. Do __2-3_ times per day.  Copyright  VHI. All rights reserved.  Dorsiflexion: ROM   Position (A) Helper: Support left leg with knee bent. Cup heel with other hand, foot resting on forearm. Motion (B) -Bend ankle so top of foot moves toward shin. -Helper uses forearm to assist. -Do not allow foot to twist or turn. -Stop at point of tension in muscle or joint. Repeat _2-3__ times on each leg. Repeat with other leg. Do _2-3__ sessions per day. Variation: Perform with knee straight. Patient is passive.  Copyright  VHI. All rights reserved.

## 2014-12-15 NOTE — Therapy (Signed)
[facility] [address] [place], [place], [address] Phone: [phone]   Fax:  [phone]  Physical Therapy Treatment  Patient Details  Name: [name] MRN: [id number] Date of Birth: 11-22-1950 Referring Provider:  Tresa GarterPlotnikov, Aleksei V, MD  Encounter Date: 12/14/2014    Past Medical History  Diagnosis Date  . Depression   . GERD (gastroesophageal reflux disease)   . Seizure disorder   . Gait disorder   . Anoxic brain injury   . Chronic neck pain   . Chronic pain in shoulder   . COPD (chronic obstructive pulmonary disease)   . LBP (low back pain)   . Osteoarthritis   . Type II or unspecified type diabetes mellitus without mention of complication, not stated as uncontrolled 2010  . Anxiety   . Vitamin B 12 deficiency 2011  . Hyperlipidemia   . Seizures   . 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 Hernia of abdominal cavity   . Neuropathy   . Fracture of left humerus 06/24/2014    Past Surgical History  Procedure Laterality Date  . Pancreatectomy    . Hernia repair      incisional  . Colonoscopy    . Tonsillectomy    . Tubal ligation    . Diagnostic laparoscopy      PMH: Exploratory lap  . Orif humerus fracture Left 06/24/2014    Procedure: LEFT OPEN REDUCTION INTERNAL FIXATION (ORIF) PROXIMAL HUMERUS FRACTURE;  Surgeon: Eulas PostJoshua P Landau, MD;  Location: [facility] OR;  Service: Orthopedics;  Laterality: Left;    There were no vitals filed for this visit.  Visit Diagnosis:  Balance problems  Abnormality of gait      Subjective Assessment - 12/14/14 1145    Symptoms Reports she has been occassionally doing some of old seated exercises but none of others. She has been non-ambulatory since humerus fx until PT eval last week. She is very afraid to walk anywhere need bathroom where she fell.   Currently in Pain? Yes   Pain Score 8    Pain Location Arm   Pain Orientation  Left   Pain Descriptors / Indicators Aching   Pain Type Surgical pain   Multiple Pain Sites Yes   Pain Score 5   Pain Type Chronic pain   Pain Location Back   Pain Orientation Lower     Therapeutic Exercise: Initial HEP of sit to/from stand w/c to RW, standing with buttocks against counter & UE support on RW - back extension / upright posture 10 reps, upright trunk with rotation to look over shoulders, seated hamstring stretch, heelcord stretch                          PT Education - 12/14/14 1230    Education provided Yes   Education Details HEP standing at counter, seated stretches to hamstring & heelcord   Person(s) Educated Patient;Caregiver(s)   Methods Explanation;Demonstration;Handout;Tactile cues;Verbal cues   Comprehension Verbalized understanding;Returned demonstration;Verbal cues required;Tactile cues required;Need further instruction          PT Short Term Goals - 12/14/14 1640    PT SHORT TERM GOAL #1   Title Patient with CNA assistance will demonstrate understanding of initial HEP to improve flexibility and balance. (Target Date: 12/31/14)   Time 4   Period Weeks   Status Revised   PT SHORT TERM GOAL #2   Title Patient ambulates 7130'  with minA with rolling walker.  (Target Date: 12/31/14)   Time 4   Period Weeks   Status Revised   PT SHORT TERM GOAL #3   Title Stands with walker support and manages pants for toileting with min guard.  (Target Date: 12/31/14)   Time 4   Period Weeks   Status Revised   PT SHORT TERM GOAL #4   Title Patient reports left shoulder pain with standing & gait </= 6/10  (Target Date: 12/31/14)   Time 4   Period Weeks   Status New           PT Long Term Goals - 12/15/14 1626    PT LONG TERM GOAL #1   Title Patient demonstrates with caregiver's cueing progressive HEP correctly. (Target Date: 01/28/15)   Time 8   Period Weeks   Status Revised   PT LONG TERM GOAL #2   Title Patient ambulates 200' with rolling  walker with CNA supervision safely.  (Target Date: 01/28/15)   Time 8   Period Weeks   Status Revised   PT LONG TERM GOAL #3   Title Patient will stand with rolling walker support, reach 4" anterior, look over her shoulders and manage her pants for toileting with supervision.  (Target Date: 01/28/15)   Time 8   Period Weeks   Status Revised   PT LONG TERM GOAL #4   Title Patient and CNA will verbalize fall prevention strategies.  (Target Date: 01/28/15)   Time 8   Period Weeks   Status New   PT LONG TERM GOAL #5   Title Patient reports left shoulder pain </= 5/10 with standing & gait.  (Target Date: 01/28/15)   Time 8   Period Weeks   Status New               Plan - 12/14/14 1230    Clinical Impression Statement Patient and caregiver seem to understand initial HEP. This PT is familar with this patient from multiple other admissions and STGs & LTGs seem to be set too high so PT has changed goals to more realistic level based on current & prior function.   Pt will benefit from skilled therapeutic intervention in order to improve on the following deficits Abnormal gait;Decreased balance;Decreased endurance;Decreased strength   Rehab Potential Fair   Clinical Impairments Affecting Rehab Potential anxiety; left arm/shoulder pain; generalized weakness and parkinsonism gait pattern   PT Frequency 2x / week   PT Duration 6 weeks   PT Treatment/Interventions Gait training;Balance training;Therapeutic exercise;Therapeutic activities   PT Next Visit Plan Develop HEP that she can do with aide several times a day; follow up from todays recommendations   PT Home Exercise Plan Overall have her doing ther ex in short duration mulitple times a day   Consulted and Agree with Plan of Care Patient;Family member/caregiver   Family Member Consulted PCS worker        Problem List Patient Active Problem List   Diagnosis Date Noted  . Well adult exam 12/08/2014  . Fracture of left humerus  06/24/2014  . Humerus fracture 06/24/2014  . Chest wall contusion 05/03/2014  . Acute bronchitis 11/10/2013  . Left elbow pain 11/03/2013  . Right facial pain 06/19/2013  . Ankle ulcer 04/02/2012  . Nausea & vomiting 10/05/2011  . Decubitus ulcer 06/26/2011  . Decubitus ulcer of coccyx 05/21/2011  . Cellulitis and abscess of other specified site 10/11/2010  . B12 deficiency 04/04/2010  . HYPERLIPIDEMIA 04/04/2010  .  ANXIETY 11/14/2009  . Vitamin D deficiency 10/21/2009  . MELENA 10/21/2009  . DM2 (diabetes mellitus, type 2) 07/07/2009  . FEVER, RECURRENT 06/21/2009  . COPD 02/25/2009  . OSTEOARTHRITIS 02/25/2009  . LOW BACK PAIN 02/25/2009  . TOBACCO USER 01/13/2009  . Rash and other nonspecific skin eruption 01/13/2009  . FURUNCLE 06/08/2008  . DEPRESSION 02/26/2007  . PAIN, CHRONIC NEC 02/26/2007  . VENOUS INSUFFICIENCY, LEGS 02/26/2007  . GERD 02/26/2007  . HERNIA, INCISIONAL 02/26/2007  . SHOULDER PAIN, LEFT 02/26/2007  . SEIZURE DISORDER 02/26/2007  . INSOMNIA 02/26/2007  . Abnormality of gait 02/26/2007  . LEG EDEMA, CHRONIC 02/26/2007    Vladimir Faster PT, DPT 12/15/2014, 4:47 PM  [facility] [address] [place], [place], [address] Phone: [phone]   Fax:  [phone]

## 2014-12-20 ENCOUNTER — Ambulatory Visit: Payer: Medicare Other | Admitting: Physical Therapy

## 2014-12-20 DIAGNOSIS — M545 Low back pain: Secondary | ICD-10-CM | POA: Diagnosis not present

## 2014-12-20 DIAGNOSIS — R269 Unspecified abnormalities of gait and mobility: Secondary | ICD-10-CM | POA: Diagnosis not present

## 2014-12-20 DIAGNOSIS — M79622 Pain in left upper arm: Secondary | ICD-10-CM | POA: Diagnosis not present

## 2014-12-20 DIAGNOSIS — F411 Generalized anxiety disorder: Secondary | ICD-10-CM | POA: Diagnosis not present

## 2014-12-20 DIAGNOSIS — R2689 Other abnormalities of gait and mobility: Secondary | ICD-10-CM

## 2014-12-21 ENCOUNTER — Ambulatory Visit: Payer: Medicare Other | Admitting: Physical Therapy

## 2014-12-21 ENCOUNTER — Encounter: Payer: Self-pay | Admitting: Physical Therapy

## 2014-12-21 DIAGNOSIS — M25512 Pain in left shoulder: Secondary | ICD-10-CM | POA: Diagnosis not present

## 2014-12-21 NOTE — Therapy (Signed)
[facility] [address] [place], [place], [address] Phone: [phone]   Fax:  [phone]  Physical Therapy Treatment  Patient Details  Name: [name] MRN: [id number] Date of Birth: 02-12-1951 Referring Provider:  Tresa GarterPlotnikov, Aleksei V, MD  Encounter Date: 12/20/2014      PT End of Session - 12/21/14 0851    Visit Number 3  G3   Number of Visits 12   Date for PT Re-Evaluation 01/14/15   Authorization Type Medicare   PT Start Time 1530   PT Stop Time 1615   PT Time Calculation (min) 45 min      Past Medical History  Diagnosis Date  . Depression   . GERD (gastroesophageal reflux disease)   . Seizure disorder   . Gait disorder   . Anoxic brain injury   . Chronic neck pain   . Chronic pain in shoulder   . COPD (chronic obstructive pulmonary disease)   . LBP (low back pain)   . Osteoarthritis   . Type II or unspecified type diabetes mellitus without mention of complication, not stated as uncontrolled 2010  . Anxiety   . Vitamin B 12 deficiency 2011  . Hyperlipidemia   . Seizures   . 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 Hernia of abdominal cavity   . Neuropathy   . Fracture of left humerus 06/24/2014    Past Surgical History  Procedure Laterality Date  . Pancreatectomy    . Hernia repair      incisional  . Colonoscopy    . Tonsillectomy    . Tubal ligation    . Diagnostic laparoscopy      PMH: Exploratory lap  . Orif humerus fracture Left 06/24/2014    Procedure: LEFT OPEN REDUCTION INTERNAL FIXATION (ORIF) PROXIMAL HUMERUS FRACTURE;  Surgeon: Eulas PostJoshua P Landau, MD;  Location: [facility] OR;  Service: Orthopedics;  Laterality: Left;    There were no vitals filed for this visit.  Visit Diagnosis:  Abnormality of gait  Balance problems      Subjective Assessment - 12/21/14 0841    Symptoms Pt. states she continues to have LUE pain due to fracture sustained back  in Sept. as result of fall;  reports amb. with walker short distances in her home with SBA; states she does not amb. very much due to pain in L arm experienced with weight bearing          Pertinent History Patient has a PCS worker that stays with her up to 8 hours a day; pt is unable to stand /walk w/o supervision;    Patient Stated Goals I want to walk well again so I can get around my home   Currently in Pain? Yes   Pain Score 6    Pain Location Arm   Pain Orientation Left   Pain Descriptors / Indicators Aching;Throbbing   Pain Type Chronic pain;Surgical pain   Pain Onset More than a month ago   Pain Frequency Constant   Aggravating Factors  weight bearing   Multiple Pain Sites No                       [facility] Adult PT Treatment/Exercise - 12/21/14 0001    Transfers   Transfers Sit to Stand  [id number]/[id number]   Ambulation/Gait   Ambulation/Gait Yes   Ambulation Distance (Feet) 23 Feet   Assistive device Rolling walker   Gait Pattern Step-to  pattern;Decreased step length - left;Decreased stance time - left;Festinating   Ambulation Surface Level     Gait; inside bars 10' x 6 reps with bil. UE support with cues for turning; outside bars 23'x1 rep in straight path; 2nd rep  20' x 1 with pt. Turning 1/2 way to sit down in wheelchair  TherEx; Nustep level 4 x 5" with UE's initially then only LE's after 1st minute  NeuroRe-ed: sidestepping inside bars 10' x 4 reps; standing unsupported inside bars with CGA         PT Short Term Goals - 12/14/14 1640    PT SHORT TERM GOAL #1   Title Patient with CNA assistance will demonstrate understanding of initial HEP to improve flexibility and balance. (Target Date: 12/31/14)   Time 4   Period Weeks   Status Revised   PT SHORT TERM GOAL #2   Title Patient ambulates 3' with minA with rolling walker.  (Target Date: 12/31/14)   Time 4   Period Weeks   Status Revised   PT SHORT TERM GOAL #3   Title Stands with walker support and manages  pants for toileting with min guard.  (Target Date: 12/31/14)   Time 4   Period Weeks   Status Revised   PT SHORT TERM GOAL #4   Title Patient reports left shoulder pain with standing & gait </= 6/10  (Target Date: 12/31/14)   Time 4   Period Weeks   Status New           PT Long Term Goals - 12/15/14 1626    PT LONG TERM GOAL #1   Title Patient demonstrates with caregiver's cueing progressive HEP correctly. (Target Date: 01/28/15)   Time 8   Period Weeks   Status Revised   PT LONG TERM GOAL #2   Title Patient ambulates 200' with rolling walker with CNA supervision safely.  (Target Date: 01/28/15)   Time 8   Period Weeks   Status Revised   PT LONG TERM GOAL #3   Title Patient will stand with rolling walker support, reach 4" anterior, look over her shoulders and manage her pants for toileting with supervision.  (Target Date: 01/28/15)   Time 8   Period Weeks   Status Revised   PT LONG TERM GOAL #4   Title Patient and CNA will verbalize fall prevention strategies.  (Target Date: 01/28/15)   Time 8   Period Weeks   Status New   PT LONG TERM GOAL #5   Title Patient reports left shoulder pain </= 5/10 with standing & gait.  (Target Date: 01/28/15)   Time 8   Period Weeks   Status New               Plan - 12/21/14 6578    Clinical Impression Statement Pt. did very well today with sit to stand transfers - able to stand up from chair without armrests with SBA; able to amb. 23' prior to needing seated rest period - unable to turn around in standing with use of  RW   Pt will benefit from skilled therapeutic intervention in order to improve on the following deficits Abnormal gait;Decreased balance;Decreased endurance;Decreased strength   Rehab Potential Fair   Clinical Impairments Affecting Rehab Potential anxiety; left arm/shoulder pain; generalized weakness and parkinsonism gait pattern   PT Frequency 2x / week   PT Duration 6 weeks   PT Treatment/Interventions Gait  training;Balance training;Therapeutic exercise;Therapeutic activities   PT Next Visit Plan begin  balance HEP when her CNA is present (out sick today so no one present to instruct in HEP)   PT Home Exercise Plan Overall have her doing ther ex in short duration mulitple times a day   Consulted and Agree with Plan of Care Patient        Problem List Patient Active Problem List   Diagnosis Date Noted  . Well adult exam 12/08/2014  . Fracture of left humerus 06/24/2014  . Humerus fracture 06/24/2014  . Chest wall contusion 05/03/2014  . Acute bronchitis 11/10/2013  . Left elbow pain 11/03/2013  . Right facial pain 06/19/2013  . Ankle ulcer 04/02/2012  . Nausea & vomiting 10/05/2011  . Decubitus ulcer 06/26/2011  . Decubitus ulcer of coccyx 05/21/2011  . Cellulitis and abscess of other specified site 10/11/2010  . B12 deficiency 04/04/2010  . HYPERLIPIDEMIA 04/04/2010  . ANXIETY 11/14/2009  . Vitamin D deficiency 10/21/2009  . MELENA 10/21/2009  . DM2 (diabetes mellitus, type 2) 07/07/2009  . FEVER, RECURRENT 06/21/2009  . COPD 02/25/2009  . OSTEOARTHRITIS 02/25/2009  . LOW BACK PAIN 02/25/2009  . TOBACCO USER 01/13/2009  . Rash and other nonspecific skin eruption 01/13/2009  . FURUNCLE 06/08/2008  . DEPRESSION 02/26/2007  . PAIN, CHRONIC NEC 02/26/2007  . VENOUS INSUFFICIENCY, LEGS 02/26/2007  . GERD 02/26/2007  . HERNIA, INCISIONAL 02/26/2007  . SHOULDER PAIN, LEFT 02/26/2007  . SEIZURE DISORDER 02/26/2007  . INSOMNIA 02/26/2007  . Abnormality of gait 02/26/2007  . LEG EDEMA, CHRONIC 02/26/2007    Kary Kos, PT 12/21/2014, 8:57 AM  [facility] [address] [place], [place], [address] Phone: [phone]   Fax:  [phone]

## 2014-12-22 ENCOUNTER — Ambulatory Visit (INDEPENDENT_AMBULATORY_CARE_PROVIDER_SITE_OTHER): Payer: Medicare Other | Admitting: Ophthalmology

## 2014-12-22 DIAGNOSIS — H3531 Nonexudative age-related macular degeneration: Secondary | ICD-10-CM | POA: Diagnosis not present

## 2014-12-22 DIAGNOSIS — H43813 Vitreous degeneration, bilateral: Secondary | ICD-10-CM

## 2014-12-22 DIAGNOSIS — H2513 Age-related nuclear cataract, bilateral: Secondary | ICD-10-CM | POA: Diagnosis not present

## 2014-12-22 LAB — HM DIABETES EYE EXAM

## 2014-12-23 DIAGNOSIS — M47816 Spondylosis without myelopathy or radiculopathy, lumbar region: Secondary | ICD-10-CM | POA: Diagnosis not present

## 2014-12-23 DIAGNOSIS — Z79891 Long term (current) use of opiate analgesic: Secondary | ICD-10-CM | POA: Diagnosis not present

## 2014-12-23 DIAGNOSIS — G894 Chronic pain syndrome: Secondary | ICD-10-CM | POA: Diagnosis not present

## 2014-12-23 DIAGNOSIS — S42293S Other displaced fracture of upper end of unspecified humerus, sequela: Secondary | ICD-10-CM | POA: Diagnosis not present

## 2014-12-27 ENCOUNTER — Encounter: Payer: Self-pay | Admitting: Physical Therapy

## 2014-12-27 ENCOUNTER — Ambulatory Visit: Payer: Medicare Other | Admitting: Physical Therapy

## 2014-12-27 DIAGNOSIS — M25522 Pain in left elbow: Secondary | ICD-10-CM

## 2014-12-27 DIAGNOSIS — F411 Generalized anxiety disorder: Secondary | ICD-10-CM | POA: Diagnosis not present

## 2014-12-27 DIAGNOSIS — R269 Unspecified abnormalities of gait and mobility: Secondary | ICD-10-CM | POA: Diagnosis not present

## 2014-12-27 DIAGNOSIS — M79622 Pain in left upper arm: Secondary | ICD-10-CM | POA: Diagnosis not present

## 2014-12-27 DIAGNOSIS — M545 Low back pain: Secondary | ICD-10-CM | POA: Diagnosis not present

## 2014-12-27 DIAGNOSIS — R2689 Other abnormalities of gait and mobility: Secondary | ICD-10-CM

## 2014-12-27 NOTE — Therapy (Signed)
[facility] [address] [place], [place], [address] Phone: [phone]   Fax:  [phone]  Physical Therapy Treatment  Patient Details  Name: [name] MRN: [id number] Date of Birth: 1950-10-20 Referring Provider:  Tresa Garter, MD  Encounter Date: 12/27/2014      PT End of Session - 12/27/14 1512    Visit Number 4  G3   Number of Visits 12   Date for PT Re-Evaluation 01/14/15   Authorization Type Medicare   Authorization Time Period Do G code   PT Start Time 1400   PT Stop Time 1448   PT Time Calculation (min) 48 min   Equipment Utilized During Treatment Gait belt   Activity Tolerance Patient tolerated treatment well   Behavior During Therapy [facility] for tasks assessed/performed      Past Medical History  Diagnosis Date  . Depression   . GERD (gastroesophageal reflux disease)   . Seizure disorder   . Gait disorder   . Anoxic brain injury   . Chronic neck pain   . Chronic pain in shoulder   . COPD (chronic obstructive pulmonary disease)   . LBP (low back pain)   . Osteoarthritis   . Type II or unspecified type diabetes mellitus without mention of complication, not stated as uncontrolled 2010  . Anxiety   . Vitamin B 12 deficiency 2011  . Hyperlipidemia   . Seizures   . 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 Hernia of abdominal cavity   . Neuropathy   . Fracture of left humerus 06/24/2014    Past Surgical History  Procedure Laterality Date  . Pancreatectomy    . Hernia repair      incisional  . Colonoscopy    . Tonsillectomy    . Tubal ligation    . Diagnostic laparoscopy      PMH: Exploratory lap  . Orif humerus fracture Left 06/24/2014    Procedure: LEFT OPEN REDUCTION INTERNAL FIXATION (ORIF) PROXIMAL HUMERUS FRACTURE;  Surgeon: Eulas Post, MD;  Location: [facility] OR;  Service: Orthopedics;  Laterality: Left;    There were no vitals filed for  this visit.  Visit Diagnosis:  Abnormality of gait  Balance problems  Pain in joint, upper arm, left      Subjective Assessment - 12/27/14 1407    Symptoms She reports her left arm is better but still hurts if keeps in one position.   Currently in Pain? No/denies                       [facility] Adult PT Treatment/Exercise - 12/27/14 1400    Transfers   Transfers Sit to Stand  sba/cga   Sit to Stand 4: Min guard;With upper extremity assist;With armrests;From chair/3-in-1  to RW for stabilization   Stand to Sit 5: Supervision;With upper extremity assist;With armrests;To chair/3-in-1  from RW / stabilization   Stand to Sit Details verbal cues for safety   Stand Pivot Transfers 4: Min assist;With armrests   Stand Pivot Transfer Details (indicate cue type and reason) manual & verbal cues on sequencing & wt shift   Ambulation/Gait   Ambulation/Gait Yes   Ambulation Distance (Feet) 15 Feet  2x   Assistive device Rolling walker   Gait Pattern Festinating;Poor foot clearance - left;Poor foot clearance - right;Wide base of support;Trunk flexed;Decreased step length - left;Decreased step length - right   Ambulation Surface Level;Indoor  Dynamic Standing Balance   Dynamic Standing - Balance Support Left upper extremity supported;During functional activity  RW or counter support   Dynamic Standing - Level of Assistance 5: Stand by assistance   Dynamic Standing - Balance Activities Reaching for objects;Reaching across midline;Alternating  foot traps  standing at counter playing card game for 5 min 2X   Dynamic Standing - Comments alternate stepping to target forward, sideways and back   5 steps /leg all 3 directions.   Shoulder Exercises: Stretch   Table Stretch - Flexion Other (comment)  PT demo as option for HEP   Table Stretch - Abduction Other (comment)  PT demo as option for HEP   Other Shoulder Stretches seated overhead BUE flexion & external rotation stretch                 PT Education - 12/27/14 1509    Education provided Yes   Education Details Seated shoulder stretches, standing balance with alternate stepping to target and reaching to perform tasks at counter.   Person(s) Educated Patient;Caregiver(s)   Methods Explanation;Demonstration   Comprehension Verbalized understanding;Returned demonstration;Need further instruction          PT Short Term Goals - 12/14/14 1640    PT SHORT TERM GOAL #1   Title Patient with CNA assistance will demonstrate understanding of initial HEP to improve flexibility and balance. (Target Date: 12/31/14)   Time 4   Period Weeks   Status Revised   PT SHORT TERM GOAL #2   Title Patient ambulates 49' with minA with rolling walker.  (Target Date: 12/31/14)   Time 4   Period Weeks   Status Revised   PT SHORT TERM GOAL #3   Title Stands with walker support and manages pants for toileting with min guard.  (Target Date: 12/31/14)   Time 4   Period Weeks   Status Revised   PT SHORT TERM GOAL #4   Title Patient reports left shoulder pain with standing & gait </= 6/10  (Target Date: 12/31/14)   Time 4   Period Weeks   Status New           PT Long Term Goals - 12/15/14 1626    PT LONG TERM GOAL #1   Title Patient demonstrates with caregiver's cueing progressive HEP correctly. (Target Date: 01/28/15)   Time 8   Period Weeks   Status Revised   PT LONG TERM GOAL #2   Title Patient ambulates 200' with rolling walker with CNA supervision safely.  (Target Date: 01/28/15)   Time 8   Period Weeks   Status Revised   PT LONG TERM GOAL #3   Title Patient will stand with rolling walker support, reach 4" anterior, look over her shoulders and manage her pants for toileting with supervision.  (Target Date: 01/28/15)   Time 8   Period Weeks   Status Revised   PT LONG TERM GOAL #4   Title Patient and CNA will verbalize fall prevention strategies.  (Target Date: 01/28/15)   Time 8   Period Weeks   Status New   PT  LONG TERM GOAL #5   Title Patient reports left shoulder pain </= 5/10 with standing & gait.  (Target Date: 01/28/15)   Time 8   Period Weeks   Status New               Plan - 12/27/14 1450    Clinical Impression Statement Patient and caregiver seem to understand UE stretches and  standing HEP added to HEP today. Patient needs work on side stepping and stepping back to position to sit down.   Pt will benefit from skilled therapeutic intervention in order to improve on the following deficits Abnormal gait;Decreased balance;Decreased endurance;Decreased strength   Rehab Potential Fair   Clinical Impairments Affecting Rehab Potential anxiety; left arm/shoulder pain; generalized weakness and parkinsonism gait pattern   PT Frequency 2x / week   PT Duration 6 weeks   PT Treatment/Interventions Gait training;Balance training;Therapeutic exercise;Therapeutic activities;ADLs/Self Care Home Management;Neuromuscular re-education   PT Next Visit Plan gait / standing activities. Work on sidestepping and stepping backwards.   PT Home Exercise Plan Overall have her doing ther ex in short duration mulitple times a day   Consulted and Agree with Plan of Care Patient;Other (Comment)   Family Member Consulted caregiver        Problem List Patient Active Problem List   Diagnosis Date Noted  . Well adult exam 12/08/2014  . Fracture of left humerus 06/24/2014  . Humerus fracture 06/24/2014  . Chest wall contusion 05/03/2014  . Acute bronchitis 11/10/2013  . Left elbow pain 11/03/2013  . Right facial pain 06/19/2013  . Ankle ulcer 04/02/2012  . Nausea & vomiting 10/05/2011  . Decubitus ulcer 06/26/2011  . Decubitus ulcer of coccyx 05/21/2011  . Cellulitis and abscess of other specified site 10/11/2010  . B12 deficiency 04/04/2010  . HYPERLIPIDEMIA 04/04/2010  . ANXIETY 11/14/2009  . Vitamin D deficiency 10/21/2009  . MELENA 10/21/2009  . DM2 (diabetes mellitus, type 2) 07/07/2009  . FEVER,  RECURRENT 06/21/2009  . COPD 02/25/2009  . OSTEOARTHRITIS 02/25/2009  . LOW BACK PAIN 02/25/2009  . TOBACCO USER 01/13/2009  . Rash and other nonspecific skin eruption 01/13/2009  . FURUNCLE 06/08/2008  . DEPRESSION 02/26/2007  . PAIN, CHRONIC NEC 02/26/2007  . VENOUS INSUFFICIENCY, LEGS 02/26/2007  . GERD 02/26/2007  . HERNIA, INCISIONAL 02/26/2007  . SHOULDER PAIN, LEFT 02/26/2007  . SEIZURE DISORDER 02/26/2007  . INSOMNIA 02/26/2007  . Abnormality of gait 02/26/2007  . LEG EDEMA, CHRONIC 02/26/2007    Vladimir FasterWALDRON,[name] PT, DPT 12/27/2014, 3:17 PM  [facility] [address] [place], [place], [address] Phone: [phone]   Fax:  [phone]

## 2014-12-28 ENCOUNTER — Telehealth: Payer: Self-pay | Admitting: Internal Medicine

## 2014-12-28 NOTE — Telephone Encounter (Signed)
Pt called in needing refill on her   carbidopa-levodopa (SINEMET IR) 25-100 MG per tablet [[id number]][[id number]]

## 2014-12-28 NOTE — Telephone Encounter (Signed)
Ok x 12 mo Thx 

## 2014-12-29 MED ORDER — CARBIDOPA-LEVODOPA 25-100 MG PO TABS: 1.0000 | ORAL_TABLET | Freq: Three times a day (TID) | ORAL | Status: DC

## 2014-12-29 NOTE — Telephone Encounter (Signed)
Notified pt md ok refill has been sent to her pharmacy...[name]/lmb

## 2014-12-30 ENCOUNTER — Ambulatory Visit: Payer: Medicare Other | Admitting: Physical Therapy

## 2014-12-30 DIAGNOSIS — R2689 Other abnormalities of gait and mobility: Secondary | ICD-10-CM

## 2014-12-30 DIAGNOSIS — F411 Generalized anxiety disorder: Secondary | ICD-10-CM | POA: Diagnosis not present

## 2014-12-30 DIAGNOSIS — M545 Low back pain, unspecified: Secondary | ICD-10-CM

## 2014-12-30 DIAGNOSIS — R269 Unspecified abnormalities of gait and mobility: Secondary | ICD-10-CM

## 2014-12-30 DIAGNOSIS — M79622 Pain in left upper arm: Secondary | ICD-10-CM | POA: Diagnosis not present

## 2014-12-30 NOTE — Therapy (Signed)
[facility] [address] [place], [place], [address] Phone: [phone]   Fax:  [phone]  Physical Therapy Treatment  Patient Details  Name: [name] MRN: [id number] Date of Birth: [birth year] Referring Provider:  Tresa Garter, MD  Encounter Date: 12/30/2014    Past Medical History  Diagnosis Date  . Depression   . GERD (gastroesophageal reflux disease)   . Seizure disorder   . Gait disorder   . Anoxic brain injury   . Chronic neck pain   . Chronic pain in shoulder   . COPD (chronic obstructive pulmonary disease)   . LBP (low back pain)   . Osteoarthritis   . Type II or unspecified type diabetes mellitus without mention of complication, not stated as uncontrolled 2010  . Anxiety   . Vitamin B 12 deficiency 2011  . Hyperlipidemia   . Seizures   . 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 Hernia of abdominal cavity   . Neuropathy   . Fracture of left humerus 06/24/2014    Past Surgical History  Procedure Laterality Date  . Pancreatectomy    . Hernia repair      incisional  . Colonoscopy    . Tonsillectomy    . Tubal ligation    . Diagnostic laparoscopy      PMH: Exploratory lap  . Orif humerus fracture Left 06/24/2014    Procedure: LEFT OPEN REDUCTION INTERNAL FIXATION (ORIF) PROXIMAL HUMERUS FRACTURE;  Surgeon: Eulas Post, MD;  Location: [facility] OR;  Service: Orthopedics;  Laterality: Left;    There were no vitals filed for this visit.  Visit Diagnosis:  No diagnosis found.      Subjective Assessment - 12/30/14 1455    Symptoms I want to get beter at walking and balance ; she has been working on ex's some with [employer] worker   Currently in Pain? Yes   Pain Score 8    Pain Location Back   Pain Orientation Lower   Pain Descriptors / Indicators Aching   Pain Type Chronic pain   Pain Frequency Intermittent   Aggravating Factors  i think it's my mattress    Effect of Pain on Daily Activities keeps me awake at night        Therapeutic exercise Side stepping along counter  5 bouts along 10' length each way; vq's for equal stride; in parallel bars walking forward /back ;with exaggerated long steps;  Standing with minimal UE support in  Bars; 1 minute with two finger support and 1 min w/o UE support   Sba; marching in bars; x 20 bars  Active standing against wall w/ FWW support x 8 min after standing she was able to walk with a much improved posture x 75' with fww and cga                         PT Short Term Goals - 12/14/14 1640    PT SHORT TERM GOAL #1   Title Patient with CNA assistance will demonstrate understanding of initial HEP to improve flexibility and balance. (Target Date: 12/31/14)   Time 4   Period Weeks   Status Revised   PT SHORT TERM GOAL #2   Title Patient ambulates 23' with minA with rolling walker.  (Target Date: 12/31/14)   Time 4   Period Weeks   Status Revised   PT SHORT TERM GOAL #3   Title  Stands with walker support and manages pants for toileting with min guard.  (Target Date: 12/31/14)   Time 4   Period Weeks   Status Revised   PT SHORT TERM GOAL #4   Title Patient reports left shoulder pain with standing & gait </= 6/10  (Target Date: 12/31/14)   Time 4   Period Weeks   Status New           PT Long Term Goals - 12/15/14 1626    PT LONG TERM GOAL #1   Title Patient demonstrates with caregiver's cueing progressive HEP correctly. (Target Date: 01/28/15)   Time 8   Period Weeks   Status Revised   PT LONG TERM GOAL #2   Title Patient ambulates 200' with rolling walker with CNA supervision safely.  (Target Date: 01/28/15)   Time 8   Period Weeks   Status Revised   PT LONG TERM GOAL #3   Title Patient will stand with rolling walker support, reach 4" anterior, look over her shoulders and manage her pants for toileting with supervision.  (Target Date: 01/28/15)   Time 8   Period Weeks    Status Revised   PT LONG TERM GOAL #4   Title Patient and CNA will verbalize fall prevention strategies.  (Target Date: 01/28/15)   Time 8   Period Weeks   Status New   PT LONG TERM GOAL #5   Title Patient reports left shoulder pain </= 5/10 with standing & gait.  (Target Date: 01/28/15)   Time 8   Period Weeks   Status New               Problem List Patient Active Problem List   Diagnosis Date Noted  . Well adult exam 12/08/2014  . Fracture of left humerus 06/24/2014  . Humerus fracture 06/24/2014  . Chest wall contusion 05/03/2014  . Acute bronchitis 11/10/2013  . Left elbow pain 11/03/2013  . Right facial pain 06/19/2013  . Ankle ulcer 04/02/2012  . Nausea & vomiting 10/05/2011  . Decubitus ulcer 06/26/2011  . Decubitus ulcer of coccyx 05/21/2011  . Cellulitis and abscess of other specified site 10/11/2010  . B12 deficiency 04/04/2010  . HYPERLIPIDEMIA 04/04/2010  . ANXIETY 11/14/2009  . Vitamin D deficiency 10/21/2009  . MELENA 10/21/2009  . DM2 (diabetes mellitus, type 2) 07/07/2009  . FEVER, RECURRENT 06/21/2009  . COPD 02/25/2009  . OSTEOARTHRITIS 02/25/2009  . LOW BACK PAIN 02/25/2009  . TOBACCO USER 01/13/2009  . Rash and other nonspecific skin eruption 01/13/2009  . FURUNCLE 06/08/2008  . DEPRESSION 02/26/2007  . PAIN, CHRONIC NEC 02/26/2007  . VENOUS INSUFFICIENCY, LEGS 02/26/2007  . GERD 02/26/2007  . HERNIA, INCISIONAL 02/26/2007  . SHOULDER PAIN, LEFT 02/26/2007  . SEIZURE DISORDER 02/26/2007  . INSOMNIA 02/26/2007  . Abnormality of gait 02/26/2007  . LEG EDEMA, CHRONIC 02/26/2007    Vashti HeyShoup, Safa Derner D PT DPT 12/30/2014, 2:58 PM  [facility] [address] [place], [place], [address] Phone: [phone]   Fax:  [phone]

## 2015-01-03 ENCOUNTER — Ambulatory Visit: Payer: Medicare Other | Attending: Neurology | Admitting: Physical Therapy

## 2015-01-03 DIAGNOSIS — M545 Low back pain: Secondary | ICD-10-CM | POA: Insufficient documentation

## 2015-01-03 DIAGNOSIS — R269 Unspecified abnormalities of gait and mobility: Secondary | ICD-10-CM | POA: Insufficient documentation

## 2015-01-03 DIAGNOSIS — M79622 Pain in left upper arm: Secondary | ICD-10-CM | POA: Diagnosis not present

## 2015-01-03 DIAGNOSIS — F411 Generalized anxiety disorder: Secondary | ICD-10-CM | POA: Diagnosis not present

## 2015-01-04 ENCOUNTER — Encounter: Payer: Self-pay | Admitting: Physical Therapy

## 2015-01-04 NOTE — Therapy (Signed)
[facility] [address] [place], [place], [address] Phone: [phone]   Fax:  [phone]  Physical Therapy Treatment  Patient Details  Name: [name] MRN: [id number] Date of Birth: 1951/04/26 Referring Provider:  Tresa Garter, MD  Encounter Date: 01/03/2015      PT End of Session - 01/04/15 1651    Visit Number 6  G6   Number of Visits 12   Date for PT Re-Evaluation 01/14/15   Authorization Type Medicare   PT Start Time 1402   PT Stop Time 1446   PT Time Calculation (min) 44 min   Equipment Utilized During Treatment Gait belt      Past Medical History  Diagnosis Date  . Depression   . GERD (gastroesophageal reflux disease)   . Seizure disorder   . Gait disorder   . Anoxic brain injury   . Chronic neck pain   . Chronic pain in shoulder   . COPD (chronic obstructive pulmonary disease)   . LBP (low back pain)   . Osteoarthritis   . Type II or unspecified type diabetes mellitus without mention of complication, not stated as uncontrolled 2010  . Anxiety   . Vitamin B 12 deficiency 2011  . Hyperlipidemia   . Seizures   . 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 Hernia of abdominal cavity   . Neuropathy   . Fracture of left humerus 06/24/2014    Past Surgical History  Procedure Laterality Date  . Pancreatectomy    . Hernia repair      incisional  . Colonoscopy    . Tonsillectomy    . Tubal ligation    . Diagnostic laparoscopy      PMH: Exploratory lap  . Orif humerus fracture Left 06/24/2014    Procedure: LEFT OPEN REDUCTION INTERNAL FIXATION (ORIF) PROXIMAL HUMERUS FRACTURE;  Surgeon: Eulas Post, MD;  Location: [facility] OR;  Service: Orthopedics;  Laterality: Left;    There were no vitals filed for this visit.  Visit Diagnosis:  Abnormality of gait      Subjective Assessment - 01/04/15 1255    Subjective Caregiver and pt. reports that pt. has not  been attempting to ambulate at home - pt. states she does not know why she hasn't - may be fear of falling   Patient Stated Goals I want to walk well again so I can get around my home   Currently in Pain? Yes   Pain Score 7    Pain Location Back   Pain Orientation Lower   Pain Descriptors / Indicators Aching   Pain Type Chronic pain   Multiple Pain Sites No                       [facility] Adult PT Treatment/Exercise - 01/04/15 0001    Transfers   Transfers Sit to Stand  sba/cga   Sit to Stand 4: Min guard;With upper extremity assist;With armrests;From chair/3-in-1  to RW for stabilization   Stand to Sit 5: Supervision;With upper extremity assist;With armrests;To chair/3-in-1  from RW / stabilization   Stand to Sit Details verbal cues   Stand Pivot Transfers 4: Min assist;With armrests   Stand Pivot Transfer Details (indicate cue type and reason) verbal cues for positioning   Ambulation/Gait   Ambulation/Gait Yes   Ambulation Distance (Feet) 15 Feet  time required 15-18 mins.   Assistive device Rolling walker   Gait  Pattern Festinating;Poor foot clearance - left;Poor foot clearance - right;Wide base of support;Trunk flexed;Decreased step length - left;Decreased step length - right   Ambulation Surface Level                PT Education - 01/04/15 1644    Education Details recommended pt. to stand at home every hour with use of RW or UE support with assistance as needed: also recommended pt. to attempt ambulation every 2 hrs with RW   Person(s) Educated Patient;Caregiver(s)   Methods Explanation;Demonstration   Comprehension Verbalized understanding          PT Short Term Goals - 12/14/14 1640    PT SHORT TERM GOAL #1   Title Patient with CNA assistance will demonstrate understanding of initial HEP to improve flexibility and balance. (Target Date: 12/31/14)   Time 4   Period Weeks   Status Revised   PT SHORT TERM GOAL #2   Title Patient ambulates 62'  with minA with rolling walker.  (Target Date: 12/31/14)   Time 4   Period Weeks   Status Revised   PT SHORT TERM GOAL #3   Title Stands with walker support and manages pants for toileting with min guard.  (Target Date: 12/31/14)   Time 4   Period Weeks   Status Revised   PT SHORT TERM GOAL #4   Title Patient reports left shoulder pain with standing & gait </= 6/10  (Target Date: 12/31/14)   Time 4   Period Weeks   Status New           PT Long Term Goals - 12/15/14 1626    PT LONG TERM GOAL #1   Title Patient demonstrates with caregiver's cueing progressive HEP correctly. (Target Date: 01/28/15)   Time 8   Period Weeks   Status Revised   PT LONG TERM GOAL #2   Title Patient ambulates 200' with rolling walker with CNA supervision safely.  (Target Date: 01/28/15)   Time 8   Period Weeks   Status Revised   PT LONG TERM GOAL #3   Title Patient will stand with rolling walker support, reach 4" anterior, look over her shoulders and manage her pants for toileting with supervision.  (Target Date: 01/28/15)   Time 8   Period Weeks   Status Revised   PT LONG TERM GOAL #4   Title Patient and CNA will verbalize fall prevention strategies.  (Target Date: 01/28/15)   Time 8   Period Weeks   Status New   PT LONG TERM GOAL #5   Title Patient reports left shoulder pain </= 5/10 with standing & gait.  (Target Date: 01/28/15)   Time 8   Period Weeks   Status New               Plan - 01/04/15 1652    Clinical Impression Statement Pt. had significant initiation problems with gait - combined also with fear of falling; gait performance declined today - pt states she doesnt know why - pt. taking shorter steps with decreased gait speed and decr. initiation    Pt will benefit from skilled therapeutic intervention in order to improve on the following deficits Abnormal gait;Decreased balance;Decreased endurance;Decreased strength   Rehab Potential Fair   Clinical Impairments Affecting Rehab  Potential anxiety; left arm/shoulder pain; generalized weakness and parkinsonism gait pattern   PT Frequency 2x / week   PT Duration 6 weeks   PT Treatment/Interventions Gait training;Balance training;Therapeutic exercise;Therapeutic activities;ADLs/Self Care Home  Management;Neuromuscular re-education   PT Next Visit Plan cont with gait and balance   Consulted and Agree with Plan of Care Patient;Family member/caregiver        Problem List Patient Active Problem List   Diagnosis Date Noted  . Well adult exam 12/08/2014  . Fracture of left humerus 06/24/2014  . Humerus fracture 06/24/2014  . Chest wall contusion 05/03/2014  . Acute bronchitis 11/10/2013  . Left elbow pain 11/03/2013  . Right facial pain 06/19/2013  . Ankle ulcer 04/02/2012  . Nausea & vomiting 10/05/2011  . Decubitus ulcer 06/26/2011  . Decubitus ulcer of coccyx 05/21/2011  . Cellulitis and abscess of other specified site 10/11/2010  . B12 deficiency 04/04/2010  . HYPERLIPIDEMIA 04/04/2010  . ANXIETY 11/14/2009  . Vitamin D deficiency 10/21/2009  . MELENA 10/21/2009  . DM2 (diabetes mellitus, type 2) 07/07/2009  . FEVER, RECURRENT 06/21/2009  . COPD 02/25/2009  . OSTEOARTHRITIS 02/25/2009  . LOW BACK PAIN 02/25/2009  . TOBACCO USER 01/13/2009  . Rash and other nonspecific skin eruption 01/13/2009  . FURUNCLE 06/08/2008  . DEPRESSION 02/26/2007  . PAIN, CHRONIC NEC 02/26/2007  . VENOUS INSUFFICIENCY, LEGS 02/26/2007  . GERD 02/26/2007  . HERNIA, INCISIONAL 02/26/2007  . SHOULDER PAIN, LEFT 02/26/2007  . SEIZURE DISORDER 02/26/2007  . INSOMNIA 02/26/2007  . Abnormality of gait 02/26/2007  . LEG EDEMA, CHRONIC 02/26/2007    Kary Kosilday, Belia Febo Suzanne, PT 01/04/2015, 4:57 PM  [name] [address] [place], [place], [address] Phone: [phone]   Fax:  [phone]

## 2015-01-06 ENCOUNTER — Ambulatory Visit: Payer: Medicare Other | Admitting: Physical Therapy

## 2015-01-06 ENCOUNTER — Encounter: Payer: Self-pay | Admitting: Physical Therapy

## 2015-01-06 DIAGNOSIS — R2689 Other abnormalities of gait and mobility: Secondary | ICD-10-CM

## 2015-01-06 DIAGNOSIS — R269 Unspecified abnormalities of gait and mobility: Secondary | ICD-10-CM

## 2015-01-06 DIAGNOSIS — F411 Generalized anxiety disorder: Secondary | ICD-10-CM | POA: Diagnosis not present

## 2015-01-06 DIAGNOSIS — M79622 Pain in left upper arm: Secondary | ICD-10-CM | POA: Diagnosis not present

## 2015-01-06 DIAGNOSIS — M545 Low back pain: Secondary | ICD-10-CM | POA: Diagnosis not present

## 2015-01-06 NOTE — Therapy (Signed)
[facility] [address] [place], [place], [address] Phone: [phone]   Fax:  [phone]  Physical Therapy Treatment  Patient Details  Name: [name] MRN: [id number] Date of Birth: [birth year] Referring Provider:  Tresa Garter, MD  Encounter Date: 01/06/2015      PT End of Session - 01/06/15 1718    Visit Number 7  G7   Number of Visits 12   Date for PT Re-Evaluation 01/14/15   Authorization Type Medicare   PT Start Time 1404   PT Stop Time 1447   PT Time Calculation (min) 43 min      Past Medical History  Diagnosis Date  . Depression   . GERD (gastroesophageal reflux disease)   . Seizure disorder   . Gait disorder   . Anoxic brain injury   . Chronic neck pain   . Chronic pain in shoulder   . COPD (chronic obstructive pulmonary disease)   . LBP (low back pain)   . Osteoarthritis   . Type II or unspecified type diabetes mellitus without mention of complication, not stated as uncontrolled 2010  . Anxiety   . Vitamin B 12 deficiency 2011  . Hyperlipidemia   . Seizures   . 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 Hernia of abdominal cavity   . Neuropathy   . Fracture of left humerus 06/24/2014    Past Surgical History  Procedure Laterality Date  . Pancreatectomy    . Hernia repair      incisional  . Colonoscopy    . Tonsillectomy    . Tubal ligation    . Diagnostic laparoscopy      PMH: Exploratory lap  . Orif humerus fracture Left 06/24/2014    Procedure: LEFT OPEN REDUCTION INTERNAL FIXATION (ORIF) PROXIMAL HUMERUS FRACTURE;  Surgeon: Eulas Post, MD;  Location: [facility] OR;  Service: Orthopedics;  Laterality: Left;    There were no vitals filed for this visit.  Visit Diagnosis:  Abnormality of gait  Balance problems      Subjective Assessment - 01/06/15 1713    Subjective Caregiver reports that pt. has had a very difficult time with walking at  home - has been unable to do - is able to stand but unable to take steps   Patient Stated Goals I want to walk well again so I can get around my home   Currently in Pain? No/denies                       [facility] Adult PT Treatment/Exercise - 01/06/15 1715    Transfers   Transfers Sit to Stand  sba/cga   Sit to Stand 4: Min guard;With upper extremity assist;With armrests;From chair/3-in-1  to RW for stabilization   Stand to Sit 4: Min guard   Ambulation/Gait   Ambulation/Gait Yes   Ambulation Distance (Feet) 5 Feet  2 reps   Assistive device Rolling walker   Gait Pattern Festinating;Poor foot clearance - left;Poor foot clearance - right;Wide base of support;Trunk flexed;Decreased step length - left;Decreased step length - right   Ambulation Surface Level   Posture/Postural Control   Posture/Postural Control Postural limitations   Postural Limitations Flexed trunk;Forward head     NeuroRe-ed:  Balance bubble used for target - touching with R foot x 10 reps to increase feedforward motor planning with  CGA to SBA with bil. UE support on walker;  Marching in place  x 10 reps with UE support             PT Short Term Goals - 12/14/14 1640    PT SHORT TERM GOAL #1   Title Patient with CNA assistance will demonstrate understanding of initial HEP to improve flexibility and balance. (Target Date: 12/31/14)   Time 4   Period Weeks   Status Revised   PT SHORT TERM GOAL #2   Title Patient ambulates 33' with minA with rolling walker.  (Target Date: 12/31/14)   Time 4   Period Weeks   Status Revised   PT SHORT TERM GOAL #3   Title Stands with walker support and manages pants for toileting with min guard.  (Target Date: 12/31/14)   Time 4   Period Weeks   Status Revised   PT SHORT TERM GOAL #4   Title Patient reports left shoulder pain with standing & gait </= 6/10  (Target Date: 12/31/14)   Time 4   Period Weeks   Status New           PT Long Term Goals - 12/15/14  1626    PT LONG TERM GOAL #1   Title Patient demonstrates with caregiver's cueing progressive HEP correctly. (Target Date: 01/28/15)   Time 8   Period Weeks   Status Revised   PT LONG TERM GOAL #2   Title Patient ambulates 200' with rolling walker with CNA supervision safely.  (Target Date: 01/28/15)   Time 8   Period Weeks   Status Revised   PT LONG TERM GOAL #3   Title Patient will stand with rolling walker support, reach 4" anterior, look over her shoulders and manage her pants for toileting with supervision.  (Target Date: 01/28/15)   Time 8   Period Weeks   Status Revised   PT LONG TERM GOAL #4   Title Patient and CNA will verbalize fall prevention strategies.  (Target Date: 01/28/15)   Time 8   Period Weeks   Status New   PT LONG TERM GOAL #5   Title Patient reports left shoulder pain </= 5/10 with standing & gait.  (Target Date: 01/28/15)   Time 8   Period Weeks   Status New               Plan - 01/06/15 1719    Clinical Impression Statement Pt. has been unable to amb. more than 5' this week in PT - is having much difficulty moving R foot (in frozen position); pt. able to freely move RLE in seated position but not in standing   Pt will benefit from skilled therapeutic intervention in order to improve on the following deficits Abnormal gait;Decreased balance;Decreased endurance;Decreased strength   Rehab Potential Fair   Clinical Impairments Affecting Rehab Potential anxiety; left arm/shoulder pain; generalized weakness and parkinsonism gait pattern   PT Frequency 2x / week   PT Duration 6 weeks   PT Treatment/Interventions Gait training;Balance training;Therapeutic exercise;Therapeutic activities;ADLs/Self Care Home Management;Neuromuscular re-education   PT Next Visit Plan cont with gait and balance   Consulted and Agree with Plan of Care Family member/caregiver   Family Member Consulted caregiver        Problem List Patient Active Problem List   Diagnosis  Date Noted  . Well adult exam 12/08/2014  . Fracture of left humerus 06/24/2014  . Humerus fracture 06/24/2014  . Chest wall contusion 05/03/2014  . Acute bronchitis 11/10/2013  . Left elbow pain 11/03/2013  . Right facial pain  06/19/2013  . Ankle ulcer 04/02/2012  . Nausea & vomiting 10/05/2011  . Decubitus ulcer 06/26/2011  . Decubitus ulcer of coccyx 05/21/2011  . Cellulitis and abscess of other specified site 10/11/2010  . B12 deficiency 04/04/2010  . HYPERLIPIDEMIA 04/04/2010  . ANXIETY 11/14/2009  . Vitamin D deficiency 10/21/2009  . MELENA 10/21/2009  . DM2 (diabetes mellitus, type 2) 07/07/2009  . FEVER, RECURRENT 06/21/2009  . COPD 02/25/2009  . OSTEOARTHRITIS 02/25/2009  . LOW BACK PAIN 02/25/2009  . TOBACCO USER 01/13/2009  . Rash and other nonspecific skin eruption 01/13/2009  . FURUNCLE 06/08/2008  . DEPRESSION 02/26/2007  . PAIN, CHRONIC NEC 02/26/2007  . VENOUS INSUFFICIENCY, LEGS 02/26/2007  . GERD 02/26/2007  . HERNIA, INCISIONAL 02/26/2007  . SHOULDER PAIN, LEFT 02/26/2007  . SEIZURE DISORDER 02/26/2007  . INSOMNIA 02/26/2007  . Abnormality of gait 02/26/2007  . LEG EDEMA, CHRONIC 02/26/2007    Kary Kosilday, Anfernee Peschke Suzanne, PT 01/06/2015, 5:23 PM  [facility] [address] [place], [place], [address] Phone: [phone]   Fax:  [phone]

## 2015-01-18 ENCOUNTER — Other Ambulatory Visit: Payer: Self-pay | Admitting: Internal Medicine

## 2015-01-18 ENCOUNTER — Telehealth: Payer: Self-pay | Admitting: Internal Medicine

## 2015-01-18 NOTE — Telephone Encounter (Signed)
[facility] already received [facility] request this am.../lmb

## 2015-01-18 NOTE — Telephone Encounter (Signed)
Patient is requesting refill of detrol to be sent to [facility] on [address].

## 2015-01-20 DIAGNOSIS — Z79891 Long term (current) use of opiate analgesic: Secondary | ICD-10-CM | POA: Diagnosis not present

## 2015-01-20 DIAGNOSIS — S42293S Other displaced fracture of upper end of unspecified humerus, sequela: Secondary | ICD-10-CM | POA: Diagnosis not present

## 2015-01-20 DIAGNOSIS — G894 Chronic pain syndrome: Secondary | ICD-10-CM | POA: Diagnosis not present

## 2015-01-20 DIAGNOSIS — M47816 Spondylosis without myelopathy or radiculopathy, lumbar region: Secondary | ICD-10-CM | POA: Diagnosis not present

## 2015-01-21 ENCOUNTER — Ambulatory Visit (INDEPENDENT_AMBULATORY_CARE_PROVIDER_SITE_OTHER): Payer: Medicare Other | Admitting: Neurology

## 2015-01-21 ENCOUNTER — Encounter: Payer: Self-pay | Admitting: Neurology

## 2015-01-21 VITALS — BP 118/70 | HR 101 | Ht 65.0 in

## 2015-01-21 DIAGNOSIS — R269 Unspecified abnormalities of gait and mobility: Secondary | ICD-10-CM | POA: Diagnosis not present

## 2015-01-21 NOTE — Progress Notes (Signed)
Follow-up Visit   Date: 01/21/2015    [name] MRN: [id number] DOB: 03-01-1951   Interim History: [name] is a 64 y.o. right-handed Caucasian female with history of diabetes mellitus (HbA1c 7.0), septicemia from GI origin with unresponsiveness and possible seizure (1999) complicated by hepatic encephalopathy, watershed infarcts, and anoxic brain injury, prolonged respiratory insufficiency, central pain syndrome, chronic gait disorder, and depression. She has been a long-time patient of [facility] and first saw me in April 2015 for ongoing problems with gait.  History of present illness: In 1999, she developed sepsis from pancreatis which was complicated possible seizure, watershed stroke with resident left hemiparesis, hepatic encephalopathy, anoxic brain injury, AKI, prolonged respiratory insufficiency s/p trach which as since been removed. She had 2669-month hospitalization and stroke rehab stay. Since her discharge in 1999, she has always been using a wheelchair or walker. She predominately uses a walker and uses a wheelchair only for long distances.   In September 2008 she had a fall and had a left-sided subdural hematoma which was treated conservatively. 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 During 2012, she had continued to have frequent falls and feels as if her feet are stuck to the floor in addition to problems with turning and walking down hallways. She also has a tremor of the right foot. She was started on Sinemet in 2013 which she feels has improved because her feet move better and "they don't feel that they are cemented to the floor". She is able to bathe herself and dress herself. She has caregivers from 9:30-5pm (M-F) and 11-2pm (saturday). She takes her sinemet IR 25/100 TID and extra carbidopa 25mg  in the morning which seems to be working for her.   The frequency of falls has improved to once every two months. Husband attributes this  to poor judgement and not recognizing her own limited. Falls are always at nighttime. She is unable to get back up. Denies any loss of smell, constipation, vivid dreams, muscle stiffness, hallucinations, and lightheadedness.  UPDATE 11/19/2014:  She had a fall in September and broke her left proximal humerus, which was managed with surgery and is doing well now.  She has no new complaints.  She continues to walk with rollator and walker as needed.  She continues to smoke a pack a day.  No new complaints.  UPDATE 01/21/2015:  Patient scheduled appointment to be sooner because she reports having difficulty with walking.  She is attending physical therapy but reports not making any progress.  Her caregiver and husband feel that she is not giving her best effort.  She had not had any falls.  Climbing stairs is much easier for her than walking on level surface.  She endorses a lot of anxiety related to her medical problems and feels as if she worries all the time.   Medications:  Current Outpatient Prescriptions on File Prior to Visit  Medication Sig Dispense Refill  . aspirin 81 MG EC tablet Take 81 mg by mouth daily.      . carbidopa-levodopa (SINEMET IR) 25-100 MG per tablet Take 1 tablet by mouth 3 (three) times daily. 270 tablet 3  . cholecalciferol (VITAMIN D) 1000 UNITS tablet Take 1,000 Units by mouth daily.    Marland Kitchen. HYDROmorphone (DILAUDID) 2 MG tablet Take 2-3 tablets (4-6 mg total) by mouth every 6 (six) hours as needed for moderate pain or severe pain. 75 tablet 0  . l-methylfolate-B6-B12 (METANX) 3-35-2 MG TABS  Take 1 tablet by mouth 2 (two) times daily. 180 tablet 3  . LORazepam (ATIVAN) 2 MG tablet TAKE ONE-HALF TO ONE TABLET BY MOUTH 3 TIMES A DAY 90 tablet 5  . metFORMIN (GLUCOPHAGE-XR) 500 MG 24 hr tablet Take 500 mg by mouth 3 (three) times daily.    . mirtazapine (REMERON) 15 MG tablet Take 1 tablet (15 mg total) by mouth at bedtime. Take at 6-8 pm 90 tablet 3  . morphine (MS CONTIN) 15  MG 12 hr tablet Take 15 mg by mouth every 12 (twelve) hours.    . promethazine (PHENERGAN) 12.5 MG tablet TAKE 1 TABLET BY MOUTH EVERY 6 HOURS AS NEEDED FOR NAUSEA 60 tablet 3  . sennosides-docusate sodium (SENOKOT-S) 8.6-50 MG tablet Take 2 tablets by mouth daily. 30 tablet 1  . tolterodine (DETROL LA) 4 MG 24 hr capsule Take 4 mg by mouth at bedtime.    Marland Kitchen zolpidem (AMBIEN) 10 MG tablet Take 1 tablet (10 mg total) by mouth at bedtime as needed. for sleep 90 tablet 1   No current facility-administered medications on file prior to visit.    Allergies:  Allergies  Allergen Reactions  . Alendronate Sodium   . Asa [Aspirin] Other (See Comments)    "stomach burning", "I can take baby aspirin"  . Fluoxetine Hcl     Review of Systems:  CONSTITUTIONAL: No fevers, chills, night sweats, or weight loss.  EYES: No visual changes or eye pain ENT: No hearing changes.  No history of nose bleeds.   RESPIRATORY: No cough, wheezing and shortness of breath.   CARDIOVASCULAR: Negative for chest pain, and palpitations.   GI: Negative for abdominal discomfort, blood in stools or black stools.  No recent change in bowel habits.   GU:  No history of incontinence.   MUSCLOSKELETAL: No history of joint pain or swelling.  No myalgias.   SKIN: Negative for lesions, rash, and itching.   ENDOCRINE: Negative for cold or heat intolerance, polydipsia or goiter.   PSYCH:  + depression +anxiety symptoms.   NEURO: As Above.   Vital Signs:  BP 118/70 mmHg  Pulse 101  Ht  (1.651 m)  Wt   SpO2 92%  Neurological Exam: MENTAL STATUS including orientation to time, place, person, recent and remote memory, attention span and concentration, language, and fund of knowledge is fair. Speech is not dysarthric .  Anxious appearing, crying at times.  CRANIAL NERVES: Pupils equal round and reactive to light.  Normal conjugate, extra-ocular eye movements in all directions of gaze, except slightly limited upward gaze.   Face is symmetric.   MOTOR:  Motor strength is 5/5 in all extremities.  No pronator drift.  Tone is normal.    MSRs:  Reflexes are 3+/4 throughout, except 2+ at the achilles bilaterally.  SENSORY:  Intact to vibration throughout.  COORDINATION/GAIT:  Normal finger-to- nose-finger.  Intact rapid alternating movements bilaterally. She reaches to walker to pull herself up to stand and legs will intermittently buckle, but she is able to stand on each leg independently without signs of weakness.  There is non-physiological aspects to gait.   IMPRESSION/PLAN: Functional gait disorder  I had a lengthy discussion with patient that there is no weakness which would prohibit her ability to walk and she needs to give better effort to perform exercises and be less dependent on her wheelchair and walker.  Her caregiver and husband agree that she is not weak, but does not try hard enough. She endorses  high degree of anxiety and I recommended behavior therapy, but she declined. 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 Again, I discussed that I do not find parkinsonian features on exam, but she reports having significant improvement with sinemet so I will continue it for now.I also discussed that at some point, it would be reasonable to try to taper her medications since she may not necessarily need the dosages she is taking, but she feels very strongly that sinemet helps, so will keep medications as it is at carbidopa-levadopa IR 25/100 three times daily.  Return to clinic as needed   The duration of this appointment visit was 30 minutes of face-to-face time with the patient.  Greater than 50% of this time was spent in counseling, explanation of diagnosis, planning of further management, and coordination of care.   Thank you for allowing me to participate in patient's care.  If I can answer any additional questions, I  would be pleased to do so.    Sincerely,    Donika K. Allena Katz, DO

## 2015-01-21 NOTE — Patient Instructions (Addendum)
It is very important that you maintain psychological integrity and believe that you can walk, because your muscles are strong.   If you decide to see a therapist for anxiety, call our office so we can send a referral. Return to clinic as needed

## 2015-01-24 ENCOUNTER — Ambulatory Visit: Payer: Medicare Other | Admitting: Physical Therapy

## 2015-01-24 ENCOUNTER — Encounter: Payer: Self-pay | Admitting: Physical Therapy

## 2015-01-24 DIAGNOSIS — R2689 Other abnormalities of gait and mobility: Secondary | ICD-10-CM

## 2015-01-24 DIAGNOSIS — R269 Unspecified abnormalities of gait and mobility: Secondary | ICD-10-CM

## 2015-01-24 DIAGNOSIS — F411 Generalized anxiety disorder: Secondary | ICD-10-CM | POA: Diagnosis not present

## 2015-01-24 DIAGNOSIS — M25522 Pain in left elbow: Secondary | ICD-10-CM

## 2015-01-24 DIAGNOSIS — M545 Low back pain: Secondary | ICD-10-CM | POA: Diagnosis not present

## 2015-01-24 DIAGNOSIS — M79622 Pain in left upper arm: Secondary | ICD-10-CM | POA: Diagnosis not present

## 2015-01-24 NOTE — Therapy (Signed)
[facility] [address] [place], [place], [address] Phone: [phone]   Fax:  [phone]  Physical Therapy Treatment  Patient Details  Name: [name] MRN: [id number] Date of Birth: 1951-07-19 Referring Provider:  Tresa Garter, MD  Encounter Date: 01/24/2015      PT End of Session - 01/24/15 1100    Visit Number 8  G7   Number of Visits 12   Date for PT Re-Evaluation 01/14/15   Authorization Type Medicare   PT Start Time 1108   PT Stop Time 1146   PT Time Calculation (min) 38 min   Equipment Utilized During Treatment Gait belt   Activity Tolerance Patient tolerated treatment well   Behavior During Therapy Anxious;WFL for tasks assessed/performed      Past Medical History  Diagnosis Date  . Depression   . GERD (gastroesophageal reflux disease)   . Seizure disorder   . Gait disorder   . Anoxic brain injury   . Chronic neck pain   . Chronic pain in shoulder   . COPD (chronic obstructive pulmonary disease)   . LBP (low back pain)   . Osteoarthritis   . Type II or unspecified type diabetes mellitus without mention of complication, not stated as uncontrolled 2010  . Anxiety   . Vitamin B 12 deficiency 2011  . Hyperlipidemia   . Seizures   . 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 Hernia of abdominal cavity   . Neuropathy   . Fracture of left humerus 06/24/2014    Past Surgical History  Procedure Laterality Date  . Pancreatectomy    . Hernia repair      incisional  . Colonoscopy    . Tonsillectomy    . Tubal ligation    . Diagnostic laparoscopy      PMH: Exploratory lap  . Orif humerus fracture Left 06/24/2014    Procedure: LEFT OPEN REDUCTION INTERNAL FIXATION (ORIF) PROXIMAL HUMERUS FRACTURE;  Surgeon: Eulas Post, MD;  Location: [facility] OR;  Service: Orthopedics;  Laterality: Left;    There were no vitals filed for this visit.  Visit Diagnosis:   Abnormality of gait  Balance problems  Pain in joint, upper arm, left  Generalized anxiety disorder      Subjective Assessment - 01/24/15 1118    Subjective Walks first thing in morning then Not walking with caregiver as she "freezes". Saw neurologist Friday and no new issues.   Currently in Pain? Yes   Pain Score 8    Pain Location Arm   Pain Orientation Left   Pain Descriptors / Indicators Dull;Aching   Pain Type Chronic pain;Surgical pain   Pain Onset More than a month ago   Pain Frequency Intermittent   Aggravating Factors  standing & walking with wt increases pain.   Pain Relieving Factors medications     Gait Training Sit to stand w/c & chair with armrests to RW with moderate A; stand to sit RW to w/c or chair with armrest with minA cues on positioning Patient ambulated 20' X 2 with rolling walker with moderate assist initially progressed to maximal assist when she fatigues or anxiety level increases. PT used flashlight as target to facilitate step length / movement / improved coordination and manual cues at pelvis.  Therapeutic Exercise:  Seated stepper 2 min with tactile / verbal cues. Propelling w/c with "walking" feet forward and backward 75' X 2 with tactile cues step length esp.  LLE. PT instructed in performing as HEP using "walking w/c" around house like going to bathroom. CNA & pt verbalize understanding.                               PT Short Term Goals - 12/14/14 1640    PT SHORT TERM GOAL #1   Title Patient with CNA assistance will demonstrate understanding of initial HEP to improve flexibility and balance. (Target Date: 12/31/14)   Time 4   Period Weeks   Status Revised   PT SHORT TERM GOAL #2   Title Patient ambulates 6030' with minA with rolling walker.  (Target Date: 12/31/14)   Time 4   Period Weeks   Status Revised   PT SHORT TERM GOAL #3   Title Stands with walker support and manages pants for toileting with min guard.  (Target  Date: 12/31/14)   Time 4   Period Weeks   Status Revised   PT SHORT TERM GOAL #4   Title Patient reports left shoulder pain with standing & gait </= 6/10  (Target Date: 12/31/14)   Time 4   Period Weeks   Status New           PT Long Term Goals - 12/15/14 1626    PT LONG TERM GOAL #1   Title Patient demonstrates with caregiver's cueing progressive HEP correctly. (Target Date: 01/28/15)   Time 8   Period Weeks   Status Revised   PT LONG TERM GOAL #2   Title Patient ambulates 200' with rolling walker with CNA supervision safely.  (Target Date: 01/28/15)   Time 8   Period Weeks   Status Revised   PT LONG TERM GOAL #3   Title Patient will stand with rolling walker support, reach 4" anterior, look over her shoulders and manage her pants for toileting with supervision.  (Target Date: 01/28/15)   Time 8   Period Weeks   Status Revised   PT LONG TERM GOAL #4   Title Patient and CNA will verbalize fall prevention strategies.  (Target Date: 01/28/15)   Time 8   Period Weeks   Status New   PT LONG TERM GOAL #5   Title Patient reports left shoulder pain </= 5/10 with standing & gait.  (Target Date: 01/28/15)   Time 8   Period Weeks   Status New               Plan - 01/24/15 1100    Clinical Impression Statement Patient having a target to step to decreased her "freezing" however she became anxious which required increased assist for gait. "Walking her w/c" she was able to move / advance LEs more fluenlty. She needs additional PT to progress her gait as she had difficulty with scheduling due to PTs' schedules being full. Patient should be recertified to work on gait & balance further.   Pt will benefit from skilled therapeutic intervention in order to improve on the following deficits Abnormal gait;Decreased balance;Decreased endurance;Decreased strength   Rehab Potential Fair   Clinical Impairments Affecting Rehab Potential anxiety; left arm/shoulder pain; generalized weakness and  parkinsonism gait pattern   PT Frequency 2x / week   PT Duration 6 weeks   PT Treatment/Interventions Gait training;Balance training;Therapeutic exercise;Therapeutic activities;ADLs/Self Care Home Management;Neuromuscular re-education   PT Next Visit Plan assess LTGs and recertify   Consulted and Agree with Plan of Care Family member/caregiver;Patient   Family Member Consulted  caregiver        Problem List Patient Active Problem List   Diagnosis Date Noted  . Well adult exam 12/08/2014  . Fracture of left humerus 06/24/2014  . Humerus fracture 06/24/2014  . Chest wall contusion 05/03/2014  . Acute bronchitis 11/10/2013  . Left elbow pain 11/03/2013  . Right facial pain 06/19/2013  . Ankle ulcer 04/02/2012  . Nausea & vomiting 10/05/2011  . Decubitus ulcer 06/26/2011  . Decubitus ulcer of coccyx 05/21/2011  . Cellulitis and abscess of other specified site 10/11/2010  . B12 deficiency 04/04/2010  . HYPERLIPIDEMIA 04/04/2010  . ANXIETY 11/14/2009  . Vitamin D deficiency 10/21/2009  . MELENA 10/21/2009  . DM2 (diabetes mellitus, type 2) 07/07/2009  . FEVER, RECURRENT 06/21/2009  . COPD 02/25/2009  . OSTEOARTHRITIS 02/25/2009  . LOW BACK PAIN 02/25/2009  . TOBACCO USER 01/13/2009  . Rash and other nonspecific skin eruption 01/13/2009  . FURUNCLE 06/08/2008  . DEPRESSION 02/26/2007  . PAIN, CHRONIC NEC 02/26/2007  . VENOUS INSUFFICIENCY, LEGS 02/26/2007  . GERD 02/26/2007  . HERNIA, INCISIONAL 02/26/2007  . SHOULDER PAIN, LEFT 02/26/2007  . SEIZURE DISORDER 02/26/2007  . INSOMNIA 02/26/2007  . Abnormality of gait 02/26/2007  . LEG EDEMA, CHRONIC 02/26/2007    Vladimir Faster PT, DPT 01/24/2015, 12:28 PM   [facility] [address] [place], [place], [address] Phone: [phone]   Fax:  [phone]

## 2015-01-25 ENCOUNTER — Ambulatory Visit: Payer: Medicare Other | Admitting: Physical Therapy

## 2015-01-25 DIAGNOSIS — R2689 Other abnormalities of gait and mobility: Secondary | ICD-10-CM

## 2015-01-25 DIAGNOSIS — R269 Unspecified abnormalities of gait and mobility: Secondary | ICD-10-CM | POA: Diagnosis not present

## 2015-01-25 DIAGNOSIS — M79622 Pain in left upper arm: Secondary | ICD-10-CM | POA: Diagnosis not present

## 2015-01-25 DIAGNOSIS — F411 Generalized anxiety disorder: Secondary | ICD-10-CM | POA: Diagnosis not present

## 2015-01-25 DIAGNOSIS — M545 Low back pain: Secondary | ICD-10-CM | POA: Diagnosis not present

## 2015-01-26 ENCOUNTER — Encounter: Payer: Self-pay | Admitting: Internal Medicine

## 2015-01-26 ENCOUNTER — Encounter: Payer: Self-pay | Admitting: Physical Therapy

## 2015-01-26 NOTE — Therapy (Signed)
[facility] [address] [place], [place], [address] Phone: [phone]   Fax:  [phone]  Physical Therapy Treatment  Patient Details  Name: [name] MRN: [id number] Date of Birth: Feb 08, 1951 Referring Provider:  Tresa Garter, MD  Encounter Date: 01/25/2015      PT End of Session - 01/26/15 0915    Visit Number 9  G9   Number of Visits 12   Date for PT Re-Evaluation 02/24/15   Authorization Type Medicare   Authorization Time Period Do G code   PT Start Time 1017   PT Stop Time 1103   PT Time Calculation (min) 46 min   Equipment Utilized During Treatment Gait belt      Past Medical History  Diagnosis Date  . Depression   . GERD (gastroesophageal reflux disease)   . Seizure disorder   . Gait disorder   . Anoxic brain injury   . Chronic neck pain   . Chronic pain in shoulder   . COPD (chronic obstructive pulmonary disease)   . LBP (low back pain)   . Osteoarthritis   . Type II or unspecified type diabetes mellitus without mention of complication, not stated as uncontrolled 2010  . Anxiety   . Vitamin B 12 deficiency 2011  . Hyperlipidemia   . Seizures   . 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 Hernia of abdominal cavity   . Neuropathy   . Fracture of left humerus 06/24/2014    Past Surgical History  Procedure Laterality Date  . Pancreatectomy    . Hernia repair      incisional  . Colonoscopy    . Tonsillectomy    . Tubal ligation    . Diagnostic laparoscopy      PMH: Exploratory lap  . Orif humerus fracture Left 06/24/2014    Procedure: LEFT OPEN REDUCTION INTERNAL FIXATION (ORIF) PROXIMAL HUMERUS FRACTURE;  Surgeon: Eulas Post, MD;  Location: [facility];  Service: Orthopedics;  Laterality: Left;    There were no vitals filed for this visit.  Visit Diagnosis:  Abnormality of gait  Balance problems      Subjective Assessment - 01/26/15  0909    Subjective Pt. states she is able to walk from bedroom to living room in early am but then is unable to amb. that same distance again later in day; caregiver states pt. is not getting up every hour   Pertinent History Patient has a Scientist, product/process development that stays with her up to 8 hours a day; pt is unable to stand Barbara Cower w/o supervision;    Limitations Standing;Walking   Patient Stated Goals I want to walk well again so I can get around my home   Currently in Pain? Yes   Pain Score 7    Pain Location Arm   Pain Orientation Left   Pain Descriptors / Indicators Aching;Dull   Pain Type Chronic pain   Pain Onset More than a month ago   Pain Frequency Intermittent   Multiple Pain Sites No                         [facility] Adult PT Treatment/Exercise - 01/26/15 0001    Transfers   Transfers Sit to Stand  sba/cga   Sit to Stand 4: Min guard;With upper extremity assist;With armrests;From chair/3-in-1  to RW for stabilization   Stand to Sit 4: Min guard   Stand  to Sit Details verbal cues for positioning   Stand Pivot Transfers 4: Min guard  occasional mod assist required   Ambulation/Gait   Ambulation/Gait Yes   Ambulation Distance (Feet) 15 Feet  2 reps-use of flashlight used for targets for foot placement   Assistive device Rolling walker   Gait Pattern Festinating;Poor foot clearance - left;Poor foot clearance - right;Wide base of support;Trunk flexed;Decreased step length - left;Decreased step length - right   Ambulation Surface Level;Indoor  straight path     NeuroRe-ed: sidestepping in parallel bars 10' x 4 reps with bil. UE support; along counter 10' x 2 reps with mod to min assist Due to anxiety/fear of falling           PT Education - 01/26/15 0914    Education provided Yes   Education Details instructed pt. to stand evdery hour and to attempt sidestepping along counter and alternate side stepping in place at sink or with use of RW   Person(s) Educated  Patient;Caregiver(s)   Methods Explanation;Demonstration   Comprehension Verbalized understanding;Returned demonstration          PT Short Term Goals - 01/25/15 1024    PT SHORT TERM GOAL #1   Title Patient with CNA assistance will demonstrate understanding of initial HEP to improve flexibility and balance. (Target Date: 12/31/14)   Status Achieved   PT SHORT TERM GOAL #3   Title Stands with walker support and manages pants for toileting with min guard.  (Target Date: 12/31/14)   Status Achieved   PT SHORT TERM GOAL #4   Title Patient reports left shoulder pain with standing & gait </= 6/10  (Target Date: 12/31/14)   Baseline reports 5-6/10 intensity   Status Achieved           PT Long Term Goals - 12/15/14 1626    PT LONG TERM GOAL #1   Title Patient demonstrates with caregiver's cueing progressive HEP correctly. (Target Date: 01/28/15)   Time 8   Period Weeks   Status Revised   PT LONG TERM GOAL #2   Title Patient ambulates 200' with rolling walker with CNA supervision safely.  (Target Date: 01/28/15)   Time 8   Period Weeks   Status Revised   PT LONG TERM GOAL #3   Title Patient will stand with rolling walker support, reach 4" anterior, look over her shoulders and manage her pants for toileting with supervision.  (Target Date: 01/28/15)   Time 8   Period Weeks   Status Revised   PT LONG TERM GOAL #4   Title Patient and CNA will verbalize fall prevention strategies.  (Target Date: 01/28/15)   Time 8   Period Weeks   Status New   PT LONG TERM GOAL #5   Title Patient reports left shoulder pain </= 5/10 with standing & gait.  (Target Date: 01/28/15)   Time 8   Period Weeks   Status New               Plan - 01/26/15 0917    Clinical Impression Statement Use of flashlight for targets for both L and R foot placement significantly improves gait - pt. able to amb. 15'  in this session in a straight path without a seated rest period - pt did require standing rest periods  due to excessive UE weight bearing on RW  Pt will benefit from skilled therapeutic intervention in order to improve on the following deficits Abnormal gait;Decreased balance;Decreased endurance;Decreased strength   Rehab Potential Fair   Clinical Impairments Affecting Rehab Potential anxiety; left arm/shoulder pain; generalized weakness and parkinsonism gait pattern   PT Frequency 2x / week   PT Duration 4 weeks   PT Treatment/Interventions Gait training;Balance training;Therapeutic exercise;Therapeutic activities;ADLs/Self Care Home Management;Neuromuscular re-education   PT Next Visit Plan complete recert- finish checking LTG's   PT Home Exercise Plan balance and standing   Consulted and Agree with Plan of Care Patient  caregiver        Problem List Patient Active Problem List   Diagnosis Date Noted  . Well adult exam 12/08/2014  . Fracture of left humerus 06/24/2014  . Humerus fracture 06/24/2014  . Chest wall contusion 05/03/2014  . Acute bronchitis 11/10/2013  . Left elbow pain 11/03/2013  . Right facial pain 06/19/2013  . Ankle ulcer 04/02/2012  . Nausea & vomiting 10/05/2011  . Decubitus ulcer 06/26/2011  . Decubitus ulcer of coccyx 05/21/2011  . Cellulitis and abscess of other specified site 10/11/2010  . B12 deficiency 04/04/2010  . HYPERLIPIDEMIA 04/04/2010  . ANXIETY 11/14/2009  . Vitamin D deficiency 10/21/2009  . MELENA 10/21/2009  . DM2 (diabetes mellitus, type 2) 07/07/2009  . FEVER, RECURRENT 06/21/2009  . COPD 02/25/2009  . OSTEOARTHRITIS 02/25/2009  . LOW BACK PAIN 02/25/2009  . TOBACCO USER 01/13/2009  . Rash and other nonspecific skin eruption 01/13/2009  . FURUNCLE 06/08/2008  . DEPRESSION 02/26/2007  . PAIN, CHRONIC NEC 02/26/2007  . VENOUS INSUFFICIENCY, LEGS 02/26/2007  . GERD 02/26/2007  . HERNIA, INCISIONAL 02/26/2007  . SHOULDER PAIN, LEFT 02/26/2007  . SEIZURE DISORDER 02/26/2007  . INSOMNIA 02/26/2007  .  Abnormality of gait 02/26/2007  . LEG EDEMA, CHRONIC 02/26/2007    Kary Kos, PT 01/26/2015, 9:22 AM  [facility] [address] [place], [place], [address] Phone: [phone]   Fax:  [phone]

## 2015-01-31 ENCOUNTER — Ambulatory Visit: Payer: Medicare Other | Attending: Neurology | Admitting: Physical Therapy

## 2015-01-31 DIAGNOSIS — R2689 Other abnormalities of gait and mobility: Secondary | ICD-10-CM

## 2015-01-31 DIAGNOSIS — M79622 Pain in left upper arm: Secondary | ICD-10-CM | POA: Diagnosis not present

## 2015-01-31 DIAGNOSIS — F411 Generalized anxiety disorder: Secondary | ICD-10-CM | POA: Diagnosis not present

## 2015-01-31 DIAGNOSIS — R269 Unspecified abnormalities of gait and mobility: Secondary | ICD-10-CM | POA: Insufficient documentation

## 2015-01-31 DIAGNOSIS — M545 Low back pain: Secondary | ICD-10-CM | POA: Diagnosis not present

## 2015-01-31 NOTE — Patient Instructions (Signed)

## 2015-02-01 ENCOUNTER — Encounter: Payer: Self-pay | Admitting: Physical Therapy

## 2015-02-01 NOTE — Therapy (Signed)
[facility] [address] [place], [place], [address] Phone: [phone]   Fax:  [phone]  Physical Therapy Treatment  Patient Details  Name: [name] MRN: [id number] Date of Birth: 1950/11/26 Referring Provider:  Cassandria Anger, MD  Encounter Date: 01/31/2015      PT End of Session - 02/01/15 0910    Visit Number 10  G10   Number of Visits 18   Date for PT Re-Evaluation 03/03/15   Authorization Type Medicare   Authorization Time Period 01-31-15 - 04-01-15   PT Start Time 1402   PT Stop Time 1447   PT Time Calculation (min) 45 min   Equipment Utilized During Treatment Gait belt      Past Medical History  Diagnosis Date  . Depression   . GERD (gastroesophageal reflux disease)   . Seizure disorder   . Gait disorder   . Anoxic brain injury   . Chronic neck pain   . Chronic pain in shoulder   . COPD (chronic obstructive pulmonary disease)   . LBP (low back pain)   . Osteoarthritis   . Type II or unspecified type diabetes mellitus without mention of complication, not stated as uncontrolled 2010  . Anxiety   . Vitamin B 12 deficiency 2011  . Hyperlipidemia   . Seizures   . 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 Hernia of abdominal cavity   . Neuropathy   . Fracture of left humerus 06/24/2014    Past Surgical History  Procedure Laterality Date  . Pancreatectomy    . Hernia repair      incisional  . Colonoscopy    . Tonsillectomy    . Tubal ligation    . Diagnostic laparoscopy      PMH: Exploratory lap  . Orif humerus fracture Left 06/24/2014    Procedure: LEFT OPEN REDUCTION INTERNAL FIXATION (ORIF) PROXIMAL HUMERUS FRACTURE;  Surgeon: Johnny Bridge, MD;  Location: [facility];  Service: Orthopedics;  Laterality: Left;    There were no vitals filed for this visit.  Visit Diagnosis:  Abnormality of gait - Plan: PT plan of care cert/re-cert  Balance problems -  Plan: PT plan of care cert/re-cert      Subjective Assessment - 02/01/15 0846    Subjective Caregiver reports pt. is doing exercises more at home but is still not consistently standing every hour   Patient Stated Goals I want to walk well again so I can get around my home   Currently in Pain? Yes   Pain Score 6    Pain Location Arm   Pain Orientation Left   Pain Descriptors / Indicators Aching;Dull   Pain Type Chronic pain   Pain Onset More than a month ago                         [facility] Adult PT Treatment/Exercise - 02/01/15 0001    Transfers   Transfers Sit to Stand  sba/cga   Sit to Stand 4: Min guard;With upper extremity assist;With armrests;From chair/3-in-1  to RW for stabilization   Stand to Sit 4: Min guard   Stand to Sit Details verbal cues for positioning   Stand Pivot Transfers 4: Min guard  occasional mod assist required   Ambulation/Gait   Ambulation/Gait Yes   Ambulation/Gait Assistance 4: Min guard  laser light used for targets   Ambulation Distance (Feet) 32 Feet  30'  2nd rep   Assistive device Rolling walker   Gait Pattern Festinating;Poor foot clearance - left;Poor foot clearance - right;Wide base of support;Trunk flexed;Decreased step length - left;Decreased step length - right   Ambulation Surface Level;Indoor     NeuroRe-ed; single limb stance/coordination activity of touching stepping stones in order of color called to increase cognitive and  Motor planning - standing at counter with RUE and with use of RW on left side; pt. Performed forward, back and side kicks x 10 reps Each leg; added these exs. To HEP  Discussed LTG's and progress with pt. And CNA, Nira Conn; reviewed HEP currently doing at home and emphasized need To consistently stand every hour at home         PT Education - 02/01/15 [phone]    Education provided Yes   Education Details added forward, bacdk and side kicks to HEP - CNA states they have pics from previous  admission   Person(s) Educated Patient;Caregiver(s)   Methods Explanation;Demonstration  handout declined   Comprehension Verbalized understanding;Returned demonstration          PT Short Term Goals - 01/25/15 1024    PT SHORT TERM GOAL #1   Title Patient with CNA assistance will demonstrate understanding of initial HEP to improve flexibility and balance. (Target Date: 12/31/14)   Status Achieved   PT SHORT TERM GOAL #3   Title Stands with walker support and manages pants for toileting with min guard.  (Target Date: 12/31/14)   Status Achieved   PT SHORT TERM GOAL #4   Title Patient reports left shoulder pain with standing & gait </= 6/10  (Target Date: 12/31/14)   Baseline reports 5-6/10 intensity   Status Achieved           PT Long Term Goals - 02/01/15 0917    PT LONG TERM GOAL #1   Title Patient demonstrates with caregiver's cueing progressive HEP correctly. (Target Date: 01/28/15)   Baseline 01-31-15 met   Status Achieved   PT LONG TERM GOAL #2   Title Patient ambulates 200' with rolling walker with CNA supervision safely.  (Target Date: 01/28/15)   Baseline 01-31-15   Status Not Met   PT LONG TERM GOAL #3   Title Patient will stand with rolling walker support, reach 4" anterior, look over her shoulders and manage her pants for toileting with supervision.  (Target Date: 01/28/15)  NEW TARGET DATE 03-03-15   Baseline inconsistent performance - 01-31-15 - LTG ongoing   Time 4   Period Weeks   Status On-going   PT LONG TERM GOAL #4   Title Patient and CNA will verbalize fall prevention strategies.  (Target Date: 01/28/15)   Baseline Information given 01-31-15   Time 4   Period Weeks   Status On-going   PT LONG TERM GOAL #5   Title Patient reports left shoulder pain </= 5/10 with standing & gait.  (Target Date: 01/28/15)   Baseline pt reports pain continues - intensity varies   Time 4   Period Weeks   Status Not Met   PT LONG TERM GOAL #6   Title Amb. 15' with RW with CGA with use  of light for targets prn  (03-03-15)   Time 4   Period Weeks   Status New   PT LONG TERM GOAL #7   Title Pt will report amb. at home with RW with CNA's assistance at least 20' for incr. household ambulation.  (03-03-15)   Time 4   Period  Weeks   Status New               Plan - 02/01/15 0914    Clinical Impression Statement Gait much improved today with use of laser for visual target for step initiation in gait - pt. able to independently step without use of laser light which she has had much difficulty doing these past couple of weeks   Pt will benefit from skilled therapeutic intervention in order to improve on the following deficits Abnormal gait;Decreased balance;Decreased endurance;Decreased strength   Rehab Potential Fair   Clinical Impairments Affecting Rehab Potential anxiety; left arm/shoulder pain; generalized weakness and parkinsonism gait pattern   PT Frequency 2x / week   PT Duration 4 weeks   PT Treatment/Interventions Gait training;Balance training;Therapeutic exercise;Therapeutic activities;ADLs/Self Care Home Management;Neuromuscular re-education   PT Next Visit Plan Cont balance and gait - recert completed   PT Home Exercise Plan balance exercises and standing   Consulted and Agree with Plan of Care Patient   Family Member Consulted CNA          G-Codes - Feb 06, 2015 7127    Functional Limitation Changing and maintaining body position   Changing and Maintaining Body Position Current Status [phone]) At least 60 percent but less than 80 percent impaired, limited or restricted   Changing and Maintaining Body Position Goal Status ([id number]) At least 40 percent but less than 60 percent impaired, limited or restricted      Problem List Patient Active Problem List   Diagnosis Date Noted  . Well adult exam 12/08/2014  . Fracture of left humerus 06/24/2014  . Humerus fracture 06/24/2014  . Chest wall contusion 05/03/2014  . Acute bronchitis 11/10/2013  . Left elbow  pain 11/03/2013  . Right facial pain 06/19/2013  . Ankle ulcer 04/02/2012  . Nausea & vomiting 10/05/2011  . Decubitus ulcer 06/26/2011  . Decubitus ulcer of coccyx 05/21/2011  . Cellulitis and abscess of other specified site 10/11/2010  . B12 deficiency 04/04/2010  . HYPERLIPIDEMIA 04/04/2010  . ANXIETY 11/14/2009  . Vitamin D deficiency 10/21/2009  . MELENA 10/21/2009  . DM2 (diabetes mellitus, type 2) 07/07/2009  . FEVER, RECURRENT 06/21/2009  . COPD 02/25/2009  . OSTEOARTHRITIS 02/25/2009  . LOW BACK PAIN 02/25/2009  . TOBACCO USER 01/13/2009  . Rash and other nonspecific skin eruption 01/13/2009  . FURUNCLE 06/08/2008  . DEPRESSION 02/26/2007  . PAIN, CHRONIC NEC 02/26/2007  . VENOUS INSUFFICIENCY, LEGS 02/26/2007  . GERD 02/26/2007  . HERNIA, INCISIONAL 02/26/2007  . SHOULDER PAIN, LEFT 02/26/2007  . SEIZURE DISORDER 02/26/2007  . INSOMNIA 02/26/2007  . Abnormality of gait 02/26/2007  . LEG EDEMA, CHRONIC 02/26/2007    Alda Lea, PT 02/01/2015, 10:04 AM  [facility] [address] [place], [place], [address] Phone: [phone]   Fax:  [phone]

## 2015-02-02 ENCOUNTER — Ambulatory Visit: Payer: Medicare Other | Admitting: Physical Therapy

## 2015-02-02 ENCOUNTER — Other Ambulatory Visit: Payer: Self-pay | Admitting: Internal Medicine

## 2015-02-02 ENCOUNTER — Encounter: Payer: Self-pay | Admitting: Physical Therapy

## 2015-02-02 DIAGNOSIS — F411 Generalized anxiety disorder: Secondary | ICD-10-CM

## 2015-02-02 DIAGNOSIS — M79622 Pain in left upper arm: Secondary | ICD-10-CM | POA: Diagnosis not present

## 2015-02-02 DIAGNOSIS — R2689 Other abnormalities of gait and mobility: Secondary | ICD-10-CM

## 2015-02-02 DIAGNOSIS — R269 Unspecified abnormalities of gait and mobility: Secondary | ICD-10-CM

## 2015-02-02 DIAGNOSIS — M545 Low back pain: Secondary | ICD-10-CM | POA: Diagnosis not present

## 2015-02-02 NOTE — Therapy (Signed)
[facility] [address] [place], [place], [address] Phone: [phone]   Fax:  [phone]  Physical Therapy Treatment  Patient Details  Name: [name] MRN: [id number] Date of Birth: 1951-01-18 Referring Provider:  Cassandria Anger, MD  Encounter Date: 02/02/2015      PT End of Session - 02/02/15 1320    Visit Number 11  G10   Number of Visits 18   Date for PT Re-Evaluation 03/03/15   Authorization Type Medicare   Authorization Time Period 01-31-15 - 04-01-15   PT Start Time 1320   PT Stop Time 1400   PT Time Calculation (min) 40 min   Equipment Utilized During Treatment Gait belt   Activity Tolerance Patient tolerated treatment well;No increased pain      Past Medical History  Diagnosis Date  . Depression   . GERD (gastroesophageal reflux disease)   . Seizure disorder   . Gait disorder   . Anoxic brain injury   . Chronic neck pain   . Chronic pain in shoulder   . COPD (chronic obstructive pulmonary disease)   . LBP (low back pain)   . Osteoarthritis   . Type II or unspecified type diabetes mellitus without mention of complication, not stated as uncontrolled 2010  . Anxiety   . Vitamin B 12 deficiency 2011  . Hyperlipidemia   . Seizures   . 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 Hernia of abdominal cavity   . Neuropathy   . Fracture of left humerus 06/24/2014    Past Surgical History  Procedure Laterality Date  . Pancreatectomy    . Hernia repair      incisional  . Colonoscopy    . Tonsillectomy    . Tubal ligation    . Diagnostic laparoscopy      PMH: Exploratory lap  . Orif humerus fracture Left 06/24/2014    Procedure: LEFT OPEN REDUCTION INTERNAL FIXATION (ORIF) PROXIMAL HUMERUS FRACTURE;  Surgeon: Johnny Bridge, MD;  Location: [facility];  Service: Orthopedics;  Laterality: Left;    There were no vitals filed for this visit.  Visit Diagnosis:   Abnormality of gait  Balance problems  Generalized anxiety disorder      Subjective Assessment - 02/02/15 1402    Subjective Pt reports no new issues or pain. States she was able to walk from her living room to the bedroom with RW. Pt is accompanied by nursing aid.    Currently in Pain? No/denies      Ambulation: Sit to/from stand w/c with armrests to RW with contact guard. Pt able to ambulate 50'x1 on indoor tile surface with RW and min A on straight path & ModA in turns esp. turning to position to sit, visual cues of laser pointer for foot placement and step length with stopping every 10-15' to correct posture. Ramps/Curbs: Ambulated 40'x1 on outdoor concrete, up and down ramp with RW and min A, visual cues of laser pointer for foot placement. Pt negotiated up and down one curb with use of RW and mod A of PT. Laser pointer was not used for this particular activity but manual assist for LE movement to descend step. Exercise: Pt performed trunk rotation x10 while seated on transfer disk to assist obliques. Seated LUE closed chain shoulder depression / triceps press with instruction to pt & CNA to perform as HEP. Both verbalized understanding.  PT Education - 02/02/15 1320    Education provided Yes   Education Details Pt instructed to only walk in the house with her husband or nurse aid present. Given new home exercises to complete 2 times a day.    Person(s) Educated Patient;Caregiver(s)   Methods Explanation;Demonstration   Comprehension Verbalized understanding;Returned demonstration          PT Short Term Goals - 01/25/15 1024    PT SHORT TERM GOAL #1   Title Patient with CNA assistance will demonstrate understanding of initial HEP to improve flexibility and balance. (Target Date: 12/31/14)   Status Achieved   PT SHORT TERM GOAL #3   Title Stands with walker support and manages pants for toileting with min guard.  (Target Date:  12/31/14)   Status Achieved   PT SHORT TERM GOAL #4   Title Patient reports left shoulder pain with standing & gait </= 6/10  (Target Date: 12/31/14)   Baseline reports 5-6/10 intensity   Status Achieved           PT Long Term Goals - 02/01/15 0917    PT LONG TERM GOAL #1   Title Patient demonstrates with caregiver's cueing progressive HEP correctly. (Target Date: 01/28/15)   Baseline 01-31-15 met   Status Achieved   PT LONG TERM GOAL #2   Title Patient ambulates 200' with rolling walker with CNA supervision safely.  (Target Date: 01/28/15)   Baseline 01-31-15   Status Not Met   PT LONG TERM GOAL #3   Title Patient will stand with rolling walker support, reach 4" anterior, look over her shoulders and manage her pants for toileting with supervision.  (Target Date: 01/28/15)  NEW TARGET DATE 03-03-15   Baseline inconsistent performance - 01-31-15 - LTG ongoing   Time 4   Period Weeks   Status On-going   PT LONG TERM GOAL #4   Title Patient and CNA will verbalize fall prevention strategies.  (Target Date: 01/28/15)   Baseline Information given 01-31-15   Time 4   Period Weeks   Status On-going   PT LONG TERM GOAL #5   Title Patient reports left shoulder pain </= 5/10 with standing & gait.  (Target Date: 01/28/15)   Baseline pt reports pain continues - intensity varies   Time 4   Period Weeks   Status Not Met   PT LONG TERM GOAL #6   Title Amb. 48' with RW with CGA with use of light for targets prn  (03-03-15)   Time 4   Period Weeks   Status New   PT LONG TERM GOAL #7   Title Pt will report amb. at home with RW with CNA's assistance at least 20' for incr. household ambulation.  (03-03-15)   Time 4   Period Weeks   Status New               Plan - 02/02/15 1320    Clinical Impression Statement More consisent step pattern with use of laser pointer for foot placement. Pt able to negotiate turns/transfers in both direction as well as going up and down ramps demonstrating great gains in  performance.    Pt will benefit from skilled therapeutic intervention in order to improve on the following deficits Abnormal gait;Decreased balance;Decreased endurance;Decreased strength   Rehab Potential Fair   Clinical Impairments Affecting Rehab Potential anxiety; left arm/shoulder pain; generalized weakness and parkinsonism gait pattern   PT Frequency 2x / week   PT Duration 4 weeks  PT Treatment/Interventions Gait training;Balance training;Therapeutic exercise;Therapeutic activities;ADLs/Self Care Home Management;Neuromuscular re-education   PT Next Visit Plan continue with balance and gait activities, ramps and curbs, improving efficiency of transfers.    PT Home Exercise Plan balance exercises and standing; while sitting, pt instructed to weight bear through left arm onto lower bar of RW   Consulted and Agree with Plan of Care Patient   Family Member Consulted CNA        Problem List Patient Active Problem List   Diagnosis Date Noted  . Well adult exam 12/08/2014  . Fracture of left humerus 06/24/2014  . Humerus fracture 06/24/2014  . Chest wall contusion 05/03/2014  . Acute bronchitis 11/10/2013  . Left elbow pain 11/03/2013  . Right facial pain 06/19/2013  . Ankle ulcer 04/02/2012  . Nausea & vomiting 10/05/2011  . Decubitus ulcer 06/26/2011  . Decubitus ulcer of coccyx 05/21/2011  . Cellulitis and abscess of other specified site 10/11/2010  . B12 deficiency 04/04/2010  . HYPERLIPIDEMIA 04/04/2010  . ANXIETY 11/14/2009  . Vitamin D deficiency 10/21/2009  . MELENA 10/21/2009  . DM2 (diabetes mellitus, type 2) 07/07/2009  . FEVER, RECURRENT 06/21/2009  . COPD 02/25/2009  . OSTEOARTHRITIS 02/25/2009  . LOW BACK PAIN 02/25/2009  . TOBACCO USER 01/13/2009  . Rash and other nonspecific skin eruption 01/13/2009  . FURUNCLE 06/08/2008  . DEPRESSION 02/26/2007  . PAIN, CHRONIC NEC 02/26/2007  . VENOUS INSUFFICIENCY, LEGS 02/26/2007  . GERD 02/26/2007  . HERNIA,  INCISIONAL 02/26/2007  . SHOULDER PAIN, LEFT 02/26/2007  . SEIZURE DISORDER 02/26/2007  . INSOMNIA 02/26/2007  . Abnormality of gait 02/26/2007  . LEG EDEMA, CHRONIC 02/26/2007   Jamey Reas, PT, DPT  [address] [place], [place] 02/02/2015, 2:10 PM  [facility] [address] [place], [place], [address] Phone: [phone]   Fax:  [phone]

## 2015-02-07 ENCOUNTER — Encounter: Payer: Self-pay | Admitting: Physical Therapy

## 2015-02-07 ENCOUNTER — Ambulatory Visit: Payer: Medicare Other | Admitting: Physical Therapy

## 2015-02-07 DIAGNOSIS — R2689 Other abnormalities of gait and mobility: Secondary | ICD-10-CM

## 2015-02-07 DIAGNOSIS — M79622 Pain in left upper arm: Secondary | ICD-10-CM | POA: Diagnosis not present

## 2015-02-07 DIAGNOSIS — F411 Generalized anxiety disorder: Secondary | ICD-10-CM | POA: Diagnosis not present

## 2015-02-07 DIAGNOSIS — R269 Unspecified abnormalities of gait and mobility: Secondary | ICD-10-CM

## 2015-02-07 DIAGNOSIS — M545 Low back pain: Secondary | ICD-10-CM | POA: Diagnosis not present

## 2015-02-07 NOTE — Therapy (Signed)
[facility] [address] [place], [place], [address] Phone: [phone]   Fax:  [phone]  Physical Therapy Treatment  Patient Details  Name: [name] MRN: [id number] Date of Birth: 05/22/1951 Referring Provider:  Cassandria Anger, MD  Encounter Date: 02/07/2015      PT End of Session - 02/07/15 2114    Visit Number 12  G2   Number of Visits 18   Date for PT Re-Evaluation 03/03/15   Authorization Type Medicare   Authorization Time Period 01-31-15 - 04-01-15   PT Start Time 1232   PT Stop Time 1317   PT Time Calculation (min) 45 min   Equipment Utilized During Treatment Gait belt      Past Medical History  Diagnosis Date  . Depression   . GERD (gastroesophageal reflux disease)   . Seizure disorder   . Gait disorder   . Anoxic brain injury   . Chronic neck pain   . Chronic pain in shoulder   . COPD (chronic obstructive pulmonary disease)   . LBP (low back pain)   . Osteoarthritis   . Type II or unspecified type diabetes mellitus without mention of complication, not stated as uncontrolled 2010  . Anxiety   . Vitamin B 12 deficiency 2011  . Hyperlipidemia   . Seizures   . 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 Hernia of abdominal cavity   . Neuropathy   . Fracture of left humerus 06/24/2014    Past Surgical History  Procedure Laterality Date  . Pancreatectomy    . Hernia repair      incisional  . Colonoscopy    . Tonsillectomy    . Tubal ligation    . Diagnostic laparoscopy      PMH: Exploratory lap  . Orif humerus fracture Left 06/24/2014    Procedure: LEFT OPEN REDUCTION INTERNAL FIXATION (ORIF) PROXIMAL HUMERUS FRACTURE;  Surgeon: Johnny Bridge, MD;  Location: [facility];  Service: Orthopedics;  Laterality: Left;    There were no vitals filed for this visit.  Visit Diagnosis:  Abnormality of gait  Balance problems      Subjective Assessment -  02/07/15 2102    Subjective Pt. states she has been trying to do exercises more at home   Patient is accompained by: Nira Conn, care attendant   Patient Stated Goals I want to walk well again so I can get around my home   Currently in Pain? No/denies                         [facility] Adult PT Treatment/Exercise - 02/07/15 0001    Transfers   Transfers Sit to Stand  sba/cga   Sit to Stand --  CGA - 5 reps from wheelchair   Stand to Sit 4: Min guard   Stand to Sit Details nues cues and assistance to turn completely around and back up to chair   Ambulation/Gait   Ambulation/Gait Assistance 4: Min guard   Ambulation Distance (Feet) 120 Feet   Assistive device Rolling walker   Gait Pattern Festinating;Poor foot clearance - left;Poor foot clearance - right;Wide base of support;Trunk flexed;Decreased step length - left;Decreased step length - right   Ambulation Surface Level;Indoor   Stairs Yes   Stairs Assistance 5: Supervision   Stair Management Technique Two rails;Step to pattern   Number of Stairs 4   Height of Stairs 6  Gait Comments pt. had 1 significant freezing episode when amb. around track today - occurred around final curve   High Level Balance   High Level Balance Activities Side stepping  10' x 4 inside bars   High Level Balance Comments pt. performed stepping over 1/2 bolster with alternating feet - 3 reps then a 180 degree turn to repeat stepping over and then another turn inside bars   Exercises   Exercises Knee/Hip   Knee/Hip Exercises: Aerobic   Stationary Bike Nustep level 3 x 5" with UE's and LE's                  PT Short Term Goals - 01/25/15 1024    PT SHORT TERM GOAL #1   Title Patient with CNA assistance will demonstrate understanding of initial HEP to improve flexibility and balance. (Target Date: 12/31/14)   Status Achieved   PT SHORT TERM GOAL #3   Title Stands with walker support and manages pants for toileting with min guard.   (Target Date: 12/31/14)   Status Achieved   PT SHORT TERM GOAL #4   Title Patient reports left shoulder pain with standing & gait </= 6/10  (Target Date: 12/31/14)   Baseline reports 5-6/10 intensity   Status Achieved           PT Long Term Goals - 02/01/15 0917    PT LONG TERM GOAL #1   Title Patient demonstrates with caregiver's cueing progressive HEP correctly. (Target Date: 01/28/15)   Baseline 01-31-15 met   Status Achieved   PT LONG TERM GOAL #2   Title Patient ambulates 200' with rolling walker with CNA supervision safely.  (Target Date: 01/28/15)   Baseline 01-31-15   Status Not Met   PT LONG TERM GOAL #3   Title Patient will stand with rolling walker support, reach 4" anterior, look over her shoulders and manage her pants for toileting with supervision.  (Target Date: 01/28/15)  NEW TARGET DATE 03-03-15   Baseline inconsistent performance - 01-31-15 - LTG ongoing   Time 4   Period Weeks   Status On-going   PT LONG TERM GOAL #4   Title Patient and CNA will verbalize fall prevention strategies.  (Target Date: 01/28/15)   Baseline Information given 01-31-15   Time 4   Period Weeks   Status On-going   PT LONG TERM GOAL #5   Title Patient reports left shoulder pain </= 5/10 with standing & gait.  (Target Date: 01/28/15)   Baseline pt reports pain continues - intensity varies   Time 4   Period Weeks   Status Not Met   PT LONG TERM GOAL #6   Title Amb. 63' with RW with CGA with use of light for targets prn  (03-03-15)   Time 4   Period Weeks   Status New   PT LONG TERM GOAL #7   Title Pt will report amb. at home with RW with CNA's assistance at least 20' for incr. household ambulation.  (03-03-15)   Time 4   Period Weeks   Status New               Plan - 02/07/15 2114    Clinical Impression Statement Pt. much improved with ambualtion today with use of flashlight for only initial 25' - pt. able to amb. 120' around entire track which she has not been able to do in several weeks    Pt will benefit from skilled therapeutic intervention in order to  improve on the following deficits Abnormal gait;Decreased balance;Decreased endurance;Decreased strength   Rehab Potential Fair   PT Frequency 2x / week   PT Duration 4 weeks   PT Treatment/Interventions Gait training;Balance training;Therapeutic exercise;Therapeutic activities;ADLs/Self Care Home Management;Neuromuscular re-education   PT Next Visit Plan cont. balance and gait - practice turns and transfers to chair with correct positioning   Consulted and Agree with Plan of Care Patient   Family Member Consulted CNA        Problem List Patient Active Problem List   Diagnosis Date Noted  . Well adult exam 12/08/2014  . Fracture of left humerus 06/24/2014  . Humerus fracture 06/24/2014  . Chest wall contusion 05/03/2014  . Acute bronchitis 11/10/2013  . Left elbow pain 11/03/2013  . Right facial pain 06/19/2013  . Ankle ulcer 04/02/2012  . Nausea & vomiting 10/05/2011  . Decubitus ulcer 06/26/2011  . Decubitus ulcer of coccyx 05/21/2011  . Cellulitis and abscess of other specified site 10/11/2010  . B12 deficiency 04/04/2010  . HYPERLIPIDEMIA 04/04/2010  . ANXIETY 11/14/2009  . Vitamin D deficiency 10/21/2009  . MELENA 10/21/2009  . DM2 (diabetes mellitus, type 2) 07/07/2009  . FEVER, RECURRENT 06/21/2009  . COPD 02/25/2009  . OSTEOARTHRITIS 02/25/2009  . LOW BACK PAIN 02/25/2009  . TOBACCO USER 01/13/2009  . Rash and other nonspecific skin eruption 01/13/2009  . FURUNCLE 06/08/2008  . DEPRESSION 02/26/2007  . PAIN, CHRONIC NEC 02/26/2007  . VENOUS INSUFFICIENCY, LEGS 02/26/2007  . GERD 02/26/2007  . HERNIA, INCISIONAL 02/26/2007  . SHOULDER PAIN, LEFT 02/26/2007  . SEIZURE DISORDER 02/26/2007  . INSOMNIA 02/26/2007  . Abnormality of gait 02/26/2007  . LEG EDEMA, CHRONIC 02/26/2007    Alda Lea, PT 02/07/2015, 9:18 PM  [facility] [address] [place], [place], [address] Phone: [phone]   Fax:  [phone]

## 2015-02-09 ENCOUNTER — Encounter: Payer: Self-pay | Admitting: Physical Therapy

## 2015-02-09 ENCOUNTER — Ambulatory Visit: Payer: Medicare Other | Admitting: Physical Therapy

## 2015-02-09 DIAGNOSIS — R2689 Other abnormalities of gait and mobility: Secondary | ICD-10-CM

## 2015-02-09 DIAGNOSIS — M79622 Pain in left upper arm: Secondary | ICD-10-CM | POA: Diagnosis not present

## 2015-02-09 DIAGNOSIS — R269 Unspecified abnormalities of gait and mobility: Secondary | ICD-10-CM

## 2015-02-09 DIAGNOSIS — F411 Generalized anxiety disorder: Secondary | ICD-10-CM | POA: Diagnosis not present

## 2015-02-09 DIAGNOSIS — M545 Low back pain: Secondary | ICD-10-CM | POA: Diagnosis not present

## 2015-02-09 NOTE — Therapy (Signed)
[facility] [address] [place], [place], [address] Phone: [phone]   Fax:  [phone]  Physical Therapy Treatment  Patient Details  Name: [name] MRN: [id number] Date of Birth: [birth year] Referring Provider:  Cassandria Anger, MD  Encounter Date: 02/09/2015      PT End of Session - 02/09/15 1400    Visit Number 13  G3   Number of Visits 18   Date for PT Re-Evaluation 03/03/15   Authorization Type Medicare   Authorization Time Period 01-31-15 - 04-01-15   PT Start Time 1400   PT Stop Time 1444   PT Time Calculation (min) 44 min   Equipment Utilized During Treatment Gait belt      Past Medical History  Diagnosis Date  . Depression   . GERD (gastroesophageal reflux disease)   . Seizure disorder   . Gait disorder   . Anoxic brain injury   . Chronic neck pain   . Chronic pain in shoulder   . COPD (chronic obstructive pulmonary disease)   . LBP (low back pain)   . Osteoarthritis   . Type II or unspecified type diabetes mellitus without mention of complication, not stated as uncontrolled 2010  . Anxiety   . Vitamin B 12 deficiency 2011  . Hyperlipidemia   . Seizures   . 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 Hernia of abdominal cavity   . Neuropathy   . Fracture of left humerus 06/24/2014    Past Surgical History  Procedure Laterality Date  . Pancreatectomy    . Hernia repair      incisional  . Colonoscopy    . Tonsillectomy    . Tubal ligation    . Diagnostic laparoscopy      PMH: Exploratory lap  . Orif humerus fracture Left 06/24/2014    Procedure: LEFT OPEN REDUCTION INTERNAL FIXATION (ORIF) PROXIMAL HUMERUS FRACTURE;  Surgeon: Johnny Bridge, MD;  Location: [facility];  Service: Orthopedics;  Laterality: Left;    There were no vitals filed for this visit.  Visit Diagnosis:  Abnormality of gait  Balance problems  Generalized anxiety disorder      Subjective Assessment - 02/09/15 1405    Subjective Walking with CNA 2-4 times per day. She gets "stuck" occasionaly but CNA is able to work her thru it using techniques taught by PT.    Patient is accompained by: --  CNA   Currently in Pain? No/denies      Gait Training: Sit to stand from chairs with armrests to RW with minA. Patient ambulated 45', 30', 15' & 94' with RW with minA with manual, visual, verbal & tactile cues on step length and wt shift. PT attempted colored theraband on front of walker as cue for step length and theraband around posterior pelvis to facilitate RW distance control. Neither of these techniques appeared to facilitate movements desired.  Therapeutic Exercise: NuStep Level 5 with BUE & BLE for 5 min with supervision /cues. PT recommended looking into [facility] for ongoing exercise post discharge and may benefit from starting 1x/wk while still receiving PT so PT can advise on program. Pt & CNA reports they will look in to it as requested.                             PT Short Term Goals - 01/25/15 1024    PT SHORT  TERM GOAL #1   Title Patient with CNA assistance will demonstrate understanding of initial HEP to improve flexibility and balance. (Target Date: 12/31/14)   Status Achieved   PT SHORT TERM GOAL #3   Title Stands with walker support and manages pants for toileting with min guard.  (Target Date: 12/31/14)   Status Achieved   PT SHORT TERM GOAL #4   Title Patient reports left shoulder pain with standing & gait </= 6/10  (Target Date: 12/31/14)   Baseline reports 5-6/10 intensity   Status Achieved           PT Long Term Goals - 02/01/15 0917    PT LONG TERM GOAL #1   Title Patient demonstrates with caregiver's cueing progressive HEP correctly. (Target Date: 01/28/15)   Baseline 01-31-15 met   Status Achieved   PT LONG TERM GOAL #2   Title Patient ambulates 200' with rolling walker with CNA supervision safely.  (Target Date:  01/28/15)   Baseline 01-31-15   Status Not Met   PT LONG TERM GOAL #3   Title Patient will stand with rolling walker support, reach 4" anterior, look over her shoulders and manage her pants for toileting with supervision.  (Target Date: 01/28/15)  NEW TARGET DATE 03-03-15   Baseline inconsistent performance - 01-31-15 - LTG ongoing   Time 4   Period Weeks   Status On-going   PT LONG TERM GOAL #4   Title Patient and CNA will verbalize fall prevention strategies.  (Target Date: 01/28/15)   Baseline Information given 01-31-15   Time 4   Period Weeks   Status On-going   PT LONG TERM GOAL #5   Title Patient reports left shoulder pain </= 5/10 with standing & gait.  (Target Date: 01/28/15)   Baseline pt reports pain continues - intensity varies   Time 4   Period Weeks   Status Not Met   PT LONG TERM GOAL #6   Title Amb. 24' with RW with CGA with use of light for targets prn  (03-03-15)   Time 4   Period Weeks   Status New   PT LONG TERM GOAL #7   Title Pt will report amb. at home with RW with CNA's assistance at least 20' for incr. household ambulation.  (03-03-15)   Time 4   Period Weeks   Status New               Plan - 02/09/15 1400    Clinical Impression Statement Patient did not respond to markers attached to RW to facilitate LE movement in gait. Patient was able to initiate LE movement ascending stairs with 2 rails with minimal freezing.   Pt will benefit from skilled therapeutic intervention in order to improve on the following deficits Abnormal gait;Decreased balance;Decreased endurance;Decreased strength   Rehab Potential Fair   PT Frequency 2x / week   PT Duration 4 weeks   PT Treatment/Interventions Gait training;Balance training;Therapeutic exercise;Therapeutic activities;ADLs/Self Care Home Management;Neuromuscular re-education   PT Next Visit Plan cont. balance and gait - practice turns and transfers to chair with correct positioning   Consulted and Agree with Plan of Care  Patient   Family Member Consulted CNA        Problem List Patient Active Problem List   Diagnosis Date Noted  . Well adult exam 12/08/2014  . Fracture of left humerus 06/24/2014  . Humerus fracture 06/24/2014  . Chest wall contusion 05/03/2014  . Acute bronchitis 11/10/2013  . Left elbow  pain 11/03/2013  . Right facial pain 06/19/2013  . Ankle ulcer 04/02/2012  . Nausea & vomiting 10/05/2011  . Decubitus ulcer 06/26/2011  . Decubitus ulcer of coccyx 05/21/2011  . Cellulitis and abscess of other specified site 10/11/2010  . B12 deficiency 04/04/2010  . HYPERLIPIDEMIA 04/04/2010  . ANXIETY 11/14/2009  . Vitamin D deficiency 10/21/2009  . MELENA 10/21/2009  . DM2 (diabetes mellitus, type 2) 07/07/2009  . FEVER, RECURRENT 06/21/2009  . COPD 02/25/2009  . OSTEOARTHRITIS 02/25/2009  . LOW BACK PAIN 02/25/2009  . TOBACCO USER 01/13/2009  . Rash and other nonspecific skin eruption 01/13/2009  . FURUNCLE 06/08/2008  . DEPRESSION 02/26/2007  . PAIN, CHRONIC NEC 02/26/2007  . VENOUS INSUFFICIENCY, LEGS 02/26/2007  . GERD 02/26/2007  . HERNIA, INCISIONAL 02/26/2007  . SHOULDER PAIN, LEFT 02/26/2007  . SEIZURE DISORDER 02/26/2007  . INSOMNIA 02/26/2007  . Abnormality of gait 02/26/2007  . LEG EDEMA, CHRONIC 02/26/2007    Jamey Reas PT, DPT 02/09/2015, 10:31 PM  [facility] [address] [place], [place], [address] Phone: [phone]   Fax:  [phone]

## 2015-02-14 ENCOUNTER — Ambulatory Visit: Payer: Medicare Other | Admitting: Physical Therapy

## 2015-02-16 ENCOUNTER — Ambulatory Visit: Payer: Medicare Other | Admitting: Physical Therapy

## 2015-02-16 ENCOUNTER — Encounter: Payer: Self-pay | Admitting: Physical Therapy

## 2015-02-16 DIAGNOSIS — R2689 Other abnormalities of gait and mobility: Secondary | ICD-10-CM

## 2015-02-16 DIAGNOSIS — M545 Low back pain: Secondary | ICD-10-CM | POA: Diagnosis not present

## 2015-02-16 DIAGNOSIS — R269 Unspecified abnormalities of gait and mobility: Secondary | ICD-10-CM

## 2015-02-16 DIAGNOSIS — M79622 Pain in left upper arm: Secondary | ICD-10-CM | POA: Diagnosis not present

## 2015-02-16 DIAGNOSIS — F411 Generalized anxiety disorder: Secondary | ICD-10-CM | POA: Diagnosis not present

## 2015-02-16 NOTE — Therapy (Signed)
[facility] [address] [place], [place], [address] Phone: [phone]   Fax:  [phone]  Physical Therapy Treatment  Patient Details  Name: [name] MRN: [id number] Date of Birth: Jul 21, 1951 Referring Provider:  Cassandria Anger, MD  Encounter Date: 02/16/2015      PT End of Session - 02/16/15 1400    Visit Number 14  G4   Number of Visits 18   Date for PT Re-Evaluation 03/03/15   Authorization Type Medicare   Authorization Time Period 01-31-15 - 04-01-15   PT Start Time 1400   PT Stop Time 1445   PT Time Calculation (min) 45 min   Equipment Utilized During Treatment Gait belt      Past Medical History  Diagnosis Date  . Depression   . GERD (gastroesophageal reflux disease)   . Seizure disorder   . Gait disorder   . Anoxic brain injury   . Chronic neck pain   . Chronic pain in shoulder   . COPD (chronic obstructive pulmonary disease)   . LBP (low back pain)   . Osteoarthritis   . Type II or unspecified type diabetes mellitus without mention of complication, not stated as uncontrolled 2010  . Anxiety   . Vitamin B 12 deficiency 2011  . Hyperlipidemia   . Seizures   . 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 Hernia of abdominal cavity   . Neuropathy   . Fracture of left humerus 06/24/2014    Past Surgical History  Procedure Laterality Date  . Pancreatectomy    . Hernia repair      incisional  . Colonoscopy    . Tonsillectomy    . Tubal ligation    . Diagnostic laparoscopy      PMH: Exploratory lap  . Orif humerus fracture Left 06/24/2014    Procedure: LEFT OPEN REDUCTION INTERNAL FIXATION (ORIF) PROXIMAL HUMERUS FRACTURE;  Surgeon: Johnny Bridge, MD;  Location: [facility];  Service: Orthopedics;  Laterality: Left;    There were no vitals filed for this visit.  Visit Diagnosis:  Abnormality of gait  Balance problems  Generalized anxiety disorder       Subjective Assessment - 02/16/15 1404    Subjective Her husband hurt his back so she has been limited in walking.   Currently in Pain? No/denies                         [facility] Adult PT Treatment/Exercise - 02/16/15 1400    Transfers   Transfers Sit to Stand;Stand to Sit   Sit to Stand 5: Supervision;With upper extremity assist;With armrests;From chair/3-in-1  to RW for stabilization   Sit to Stand Details (indicate cue type and reason) tactile & verbal cues on wt shift   Stand to Sit 5: Supervision;With upper extremity assist;With armrests;To chair/3-in-1  from RW   Stand to Sit Details verbal cues on controlling   Stand Pivot Transfers 4: Min guard  w/c to NuStep over armrests   Ambulation/Gait   Ambulation/Gait Yes   Ambulation/Gait Assistance 4: Min assist   Ambulation/Gait Assistance Details manual cues on wt shift, verbal & visual cues on step length   Ambulation Distance (Feet) 35 Feet  35', 20', 25'   Assistive device Rolling walker   Gait Pattern Festinating;Poor foot clearance - left;Poor foot clearance - right;Wide base of support;Trunk flexed;Decreased step length - left;Decreased step length - right  Ambulation Surface Indoor;Level   Gait Comments numerous freezing episodes with increased anxiety today   High Level Balance   High Level Balance Activities Side stepping  with RW with minA & constant verbal cues   High Level Balance Comments standing with back on door frame to facilitate upright trunk & use of RW for UE assist: single UE raises to frame for stretch, alternating stepping over cane on floor,      Therapeutic Exercise: Seated hamstring stretches 20 sec 2 reps each; alternate side stepping over obstacles, forward stepping over obstacles. NuStep Level 5 with BUE & BLE 6 min with cues. PT discussed options for exercise plan at [facility].           PT Education - 02/16/15 1400    Education provided Yes   Education Details  seated alternate stepping over obstacles, side stepping over obstacles   Person(s) Educated Patient;Caregiver(s)   Methods Explanation;Demonstration;Verbal cues   Comprehension Verbalized understanding;Returned demonstration;Verbal cues required;Need further instruction          PT Short Term Goals - 01/25/15 1024    PT SHORT TERM GOAL #1   Title Patient with CNA assistance will demonstrate understanding of initial HEP to improve flexibility and balance. (Target Date: 12/31/14)   Status Achieved   PT SHORT TERM GOAL #3   Title Stands with walker support and manages pants for toileting with min guard.  (Target Date: 12/31/14)   Status Achieved   PT SHORT TERM GOAL #4   Title Patient reports left shoulder pain with standing & gait </= 6/10  (Target Date: 12/31/14)   Baseline reports 5-6/10 intensity   Status Achieved           PT Long Term Goals - 02/01/15 0917    PT LONG TERM GOAL #1   Title Patient demonstrates with caregiver's cueing progressive HEP correctly. (Target Date: 01/28/15)   Baseline 01-31-15 met   Status Achieved   PT LONG TERM GOAL #2   Title Patient ambulates 200' with rolling walker with CNA supervision safely.  (Target Date: 01/28/15)   Baseline 01-31-15   Status Not Met   PT LONG TERM GOAL #3   Title Patient will stand with rolling walker support, reach 4" anterior, look over her shoulders and manage her pants for toileting with supervision.  (Target Date: 01/28/15)  NEW TARGET DATE 03-03-15   Baseline inconsistent performance - 01-31-15 - LTG ongoing   Time 4   Period Weeks   Status On-going   PT LONG TERM GOAL #4   Title Patient and CNA will verbalize fall prevention strategies.  (Target Date: 01/28/15)   Baseline Information given 01-31-15   Time 4   Period Weeks   Status On-going   PT LONG TERM GOAL #5   Title Patient reports left shoulder pain </= 5/10 with standing & gait.  (Target Date: 01/28/15)   Baseline pt reports pain continues - intensity varies   Time 4    Period Weeks   Status Not Met   PT LONG TERM GOAL #6   Title Amb. 46' with RW with CGA with use of light for targets prn  (03-03-15)   Time 4   Period Weeks   Status New   PT LONG TERM GOAL #7   Title Pt will report amb. at home with RW with CNA's assistance at least 20' for incr. household ambulation.  (03-03-15)   Time 4   Period Weeks   Status New  Plan - 02/16/15 1400    Clinical Impression Statement Patient had increased anxiety today which negatively impacted her gait. Patient responds well to reciprocal type exercises. Patient plans to look further into [facility].   Pt will benefit from skilled therapeutic intervention in order to improve on the following deficits Abnormal gait;Decreased balance;Decreased endurance;Decreased strength   Rehab Potential Fair   PT Frequency 2x / week   PT Duration 4 weeks   PT Treatment/Interventions Gait training;Balance training;Therapeutic exercise;Therapeutic activities;ADLs/Self Care Home Management;Neuromuscular re-education   PT Next Visit Plan cont. balance and gait - practice turns and transfers to chair with correct positioning   Consulted and Agree with Plan of Care Patient   Family Member Consulted CNA        Problem List Patient Active Problem List   Diagnosis Date Noted  . Well adult exam 12/08/2014  . Fracture of left humerus 06/24/2014  . Humerus fracture 06/24/2014  . Chest wall contusion 05/03/2014  . Acute bronchitis 11/10/2013  . Left elbow pain 11/03/2013  . Right facial pain 06/19/2013  . Ankle ulcer 04/02/2012  . Nausea & vomiting 10/05/2011  . Decubitus ulcer 06/26/2011  . Decubitus ulcer of coccyx 05/21/2011  . Cellulitis and abscess of other specified site 10/11/2010  . B12 deficiency 04/04/2010  . HYPERLIPIDEMIA 04/04/2010  . ANXIETY 11/14/2009  . Vitamin D deficiency 10/21/2009  . MELENA 10/21/2009  . DM2 (diabetes mellitus, type 2) 07/07/2009  . FEVER, RECURRENT 06/21/2009   . COPD 02/25/2009  . OSTEOARTHRITIS 02/25/2009  . LOW BACK PAIN 02/25/2009  . TOBACCO USER 01/13/2009  . Rash and other nonspecific skin eruption 01/13/2009  . FURUNCLE 06/08/2008  . DEPRESSION 02/26/2007  . PAIN, CHRONIC NEC 02/26/2007  . VENOUS INSUFFICIENCY, LEGS 02/26/2007  . GERD 02/26/2007  . HERNIA, INCISIONAL 02/26/2007  . SHOULDER PAIN, LEFT 02/26/2007  . SEIZURE DISORDER 02/26/2007  . INSOMNIA 02/26/2007  . Abnormality of gait 02/26/2007  . LEG EDEMA, CHRONIC 02/26/2007    Jamey Reas PT, DPT 02/16/2015, 9:31 PM  [facility] [address] [place], [place], [address] Phone: [phone]   Fax:  [phone]

## 2015-02-17 DIAGNOSIS — G894 Chronic pain syndrome: Secondary | ICD-10-CM | POA: Diagnosis not present

## 2015-02-17 DIAGNOSIS — S42293S Other displaced fracture of upper end of unspecified humerus, sequela: Secondary | ICD-10-CM | POA: Diagnosis not present

## 2015-02-17 DIAGNOSIS — Z79891 Long term (current) use of opiate analgesic: Secondary | ICD-10-CM | POA: Diagnosis not present

## 2015-02-17 DIAGNOSIS — M47816 Spondylosis without myelopathy or radiculopathy, lumbar region: Secondary | ICD-10-CM | POA: Diagnosis not present

## 2015-02-21 ENCOUNTER — Ambulatory Visit: Payer: Medicare Other | Admitting: Physical Therapy

## 2015-02-21 ENCOUNTER — Encounter: Payer: Self-pay | Admitting: Physical Therapy

## 2015-02-21 DIAGNOSIS — R269 Unspecified abnormalities of gait and mobility: Secondary | ICD-10-CM

## 2015-02-21 DIAGNOSIS — R2689 Other abnormalities of gait and mobility: Secondary | ICD-10-CM

## 2015-02-21 DIAGNOSIS — F411 Generalized anxiety disorder: Secondary | ICD-10-CM | POA: Diagnosis not present

## 2015-02-21 DIAGNOSIS — M545 Low back pain: Secondary | ICD-10-CM | POA: Diagnosis not present

## 2015-02-21 DIAGNOSIS — M79622 Pain in left upper arm: Secondary | ICD-10-CM | POA: Diagnosis not present

## 2015-02-21 NOTE — Therapy (Signed)
[facility] [address] [place], [place], [address] Phone: [phone]   Fax:  [phone]  Physical Therapy Treatment  Patient Details  Name: [name] MRN: [id number] Date of Birth: 02-14-1951 Referring Provider:  Cassandria Anger, MD  Encounter Date: 02/21/2015      PT End of Session - 02/21/15 2154    Visit Number 15  G5   Number of Visits 18   Date for PT Re-Evaluation 03/03/15   Authorization Type Medicare   Authorization Time Period 01-31-15 - 04-01-15   PT Start Time 1103   PT Stop Time 1147   PT Time Calculation (min) 44 min   Equipment Utilized During Treatment Gait belt      Past Medical History  Diagnosis Date  . Depression   . GERD (gastroesophageal reflux disease)   . Seizure disorder   . Gait disorder   . Anoxic brain injury   . Chronic neck pain   . Chronic pain in shoulder   . COPD (chronic obstructive pulmonary disease)   . LBP (low back pain)   . Osteoarthritis   . Type II or unspecified type diabetes mellitus without mention of complication, not stated as uncontrolled 2010  . Anxiety   . Vitamin B 12 deficiency 2011  . Hyperlipidemia   . Seizures   . 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 Hernia of abdominal cavity   . Neuropathy   . Fracture of left humerus 06/24/2014    Past Surgical History  Procedure Laterality Date  . Pancreatectomy    . Hernia repair      incisional  . Colonoscopy    . Tonsillectomy    . Tubal ligation    . Diagnostic laparoscopy      PMH: Exploratory lap  . Orif humerus fracture Left 06/24/2014    Procedure: LEFT OPEN REDUCTION INTERNAL FIXATION (ORIF) PROXIMAL HUMERUS FRACTURE;  Surgeon: Johnny Bridge, MD;  Location: [facility];  Service: Orthopedics;  Laterality: Left;    There were no vitals filed for this visit.  Visit Diagnosis:  Abnormality of gait  Balance problems      Subjective Assessment -  02/21/15 2148    Subjective Caregiver reports pt has not been walking at home very much  - and has trouble turning 90 degrees when she does walk in the home   Patient is accompained by: --  CNA - Heather   Patient Stated Goals I want to walk well again so I can get around my home   Currently in Pain? No/denies                         [facility] Adult PT Treatment/Exercise - 02/21/15 0001    Transfers   Transfers Sit to Stand;Stand to Sit   Sit to Stand 5: Supervision;With upper extremity assist;With armrests;From chair/3-in-1  to RW for stabilization   Stand to Sit 5: Supervision;With upper extremity assist;With armrests;To chair/3-in-1  from RW   Stand Pivot Transfers 4: Min guard  w/c to NuStep over armrests   Stand Pivot Transfer Details (indicate cue type and reason) laser pointer used for visual cues for R and L foot positioning   Ambulation/Gait   Ambulation/Gait Yes   Ambulation/Gait Assistance 4: Min assist   Ambulation/Gait Assistance Details laser used for viusal cues on floor to fascilitate step length and positioning   Ambulation Distance (Feet) 15 Feet  13'  x 1, 10' x1 and 17' x1   Assistive device Rolling walker   Gait Pattern Step-to pattern;Festinating;Decreased step length - right;Decreased weight shift to right   Ambulation Surface Level;Indoor   Stairs Yes   Stairs Assistance 5: Supervision   Stair Management Technique Two rails;Step to pattern;Forwards   Number of Stairs 4   Height of Stairs 6   Knee/Hip Exercises: Aerobic   Stationary Bike Nustep level 3 x 5" with UE's and LE's     TherAct; practiced turning with RW to Nustep seat and to wheelchair and to chair with armrests - with seated rest period Between each of these seats - with gait training distance of approx. 8'-10' between seats - mod assist needed today with turning             PT Short Term Goals - 01/25/15 1024    PT SHORT TERM GOAL #1   Title Patient with CNA assistance  will demonstrate understanding of initial HEP to improve flexibility and balance. (Target Date: 12/31/14)   Status Achieved   PT SHORT TERM GOAL #3   Title Stands with walker support and manages pants for toileting with min guard.  (Target Date: 12/31/14)   Status Achieved   PT SHORT TERM GOAL #4   Title Patient reports left shoulder pain with standing & gait </= 6/10  (Target Date: 12/31/14)   Baseline reports 5-6/10 intensity   Status Achieved           PT Long Term Goals - 02/01/15 0917    PT LONG TERM GOAL #1   Title Patient demonstrates with caregiver's cueing progressive HEP correctly. (Target Date: 01/28/15)   Baseline 01-31-15 met   Status Achieved   PT LONG TERM GOAL #2   Title Patient ambulates 200' with rolling walker with CNA supervision safely.  (Target Date: 01/28/15)   Baseline 01-31-15   Status Not Met   PT LONG TERM GOAL #3   Title Patient will stand with rolling walker support, reach 4" anterior, look over her shoulders and manage her pants for toileting with supervision.  (Target Date: 01/28/15)  NEW TARGET DATE 03-03-15   Baseline inconsistent performance - 01-31-15 - LTG ongoing   Time 4   Period Weeks   Status On-going   PT LONG TERM GOAL #4   Title Patient and CNA will verbalize fall prevention strategies.  (Target Date: 01/28/15)   Baseline Information given 01-31-15   Time 4   Period Weeks   Status On-going   PT LONG TERM GOAL #5   Title Patient reports left shoulder pain </= 5/10 with standing & gait.  (Target Date: 01/28/15)   Baseline pt reports pain continues - intensity varies   Time 4   Period Weeks   Status Not Met   PT LONG TERM GOAL #6   Title Amb. 74' with RW with CGA with use of light for targets prn  (03-03-15)   Time 4   Period Weeks   Status New   PT LONG TERM GOAL #7   Title Pt will report amb. at home with RW with CNA's assistance at least 20' for incr. household ambulation.  (03-03-15)   Time 4   Period Weeks   Status New                Plan - 02/21/15 2155    Clinical Impression Statement Pt. having more difficulty with ambulation today than she did 2 weeks ago; visual cues with laser significantly  help to initiate steps in gait; pt. has much difficulty with turns   Pt will benefit from skilled therapeutic intervention in order to improve on the following deficits Abnormal gait;Decreased balance;Decreased endurance;Decreased strength   Rehab Potential Fair   Clinical Impairments Affecting Rehab Potential anxiety; left arm/shoulder pain; generalized weakness and parkinsonism gait pattern   PT Frequency 2x / week   PT Duration 4 weeks   PT Treatment/Interventions Gait training;Balance training;Therapeutic exercise;Therapeutic activities;ADLs/Self Care Home Management;Neuromuscular re-education   PT Next Visit Plan cont. balance and gait - practice turns and transfers to chair with correct positioning   Consulted and Agree with Plan of Care Patient   Family Member Consulted CNA        Problem List Patient Active Problem List   Diagnosis Date Noted  . Well adult exam 12/08/2014  . Fracture of left humerus 06/24/2014  . Humerus fracture 06/24/2014  . Chest wall contusion 05/03/2014  . Acute bronchitis 11/10/2013  . Left elbow pain 11/03/2013  . Right facial pain 06/19/2013  . Ankle ulcer 04/02/2012  . Nausea & vomiting 10/05/2011  . Decubitus ulcer 06/26/2011  . Decubitus ulcer of coccyx 05/21/2011  . Cellulitis and abscess of other specified site 10/11/2010  . B12 deficiency 04/04/2010  . HYPERLIPIDEMIA 04/04/2010  . ANXIETY 11/14/2009  . Vitamin D deficiency 10/21/2009  . MELENA 10/21/2009  . DM2 (diabetes mellitus, type 2) 07/07/2009  . FEVER, RECURRENT 06/21/2009  . COPD 02/25/2009  . OSTEOARTHRITIS 02/25/2009  . LOW BACK PAIN 02/25/2009  . TOBACCO USER 01/13/2009  . Rash and other nonspecific skin eruption 01/13/2009  . FURUNCLE 06/08/2008  . DEPRESSION 02/26/2007  . PAIN, CHRONIC NEC 02/26/2007  .  VENOUS INSUFFICIENCY, LEGS 02/26/2007  . GERD 02/26/2007  . HERNIA, INCISIONAL 02/26/2007  . SHOULDER PAIN, LEFT 02/26/2007  . SEIZURE DISORDER 02/26/2007  . INSOMNIA 02/26/2007  . Abnormality of gait 02/26/2007  . LEG EDEMA, CHRONIC 02/26/2007    Alda Lea, PT 02/21/2015, 10:01 PM  [facility] [address] [place], [place], [address] Phone: [phone]   Fax:  [phone]

## 2015-02-22 ENCOUNTER — Other Ambulatory Visit: Payer: Self-pay | Admitting: Internal Medicine

## 2015-02-23 ENCOUNTER — Encounter: Payer: Self-pay | Admitting: Physical Therapy

## 2015-02-23 ENCOUNTER — Ambulatory Visit: Payer: Medicare Other | Admitting: Physical Therapy

## 2015-02-23 DIAGNOSIS — R269 Unspecified abnormalities of gait and mobility: Secondary | ICD-10-CM | POA: Diagnosis not present

## 2015-02-23 DIAGNOSIS — M79622 Pain in left upper arm: Secondary | ICD-10-CM | POA: Diagnosis not present

## 2015-02-23 DIAGNOSIS — F411 Generalized anxiety disorder: Secondary | ICD-10-CM

## 2015-02-23 DIAGNOSIS — R2689 Other abnormalities of gait and mobility: Secondary | ICD-10-CM

## 2015-02-23 DIAGNOSIS — M545 Low back pain: Secondary | ICD-10-CM | POA: Diagnosis not present

## 2015-02-23 NOTE — Therapy (Signed)
[facility] [address] [place], [place], [address] Phone: [phone]   Fax:  [phone]  Physical Therapy Treatment  Patient Details  Name: [name] MRN: [id number] Date of Birth: May 03, 1951 Referring Provider:  Cassandria Anger, MD  Encounter Date: 02/23/2015      PT End of Session - 02/23/15 1230    Visit Number 16  G5   Number of Visits 18   Date for PT Re-Evaluation 03/03/15   Authorization Type Medicare   Authorization Time Period 01-31-15 - 04-01-15   PT Start Time 1230   PT Stop Time 1310   PT Time Calculation (min) 40 min   Equipment Utilized During Treatment Gait belt   Activity Tolerance Patient tolerated treatment well   Behavior During Therapy [facility] for tasks assessed/performed;Anxious      Past Medical History  Diagnosis Date  . Depression   . GERD (gastroesophageal reflux disease)   . Seizure disorder   . Gait disorder   . Anoxic brain injury   . Chronic neck pain   . Chronic pain in shoulder   . COPD (chronic obstructive pulmonary disease)   . LBP (low back pain)   . Osteoarthritis   . Type II or unspecified type diabetes mellitus without mention of complication, not stated as uncontrolled 2010  . Anxiety   . Vitamin B 12 deficiency 2011  . Hyperlipidemia   . Seizures   . 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 Hernia of abdominal cavity   . Neuropathy   . Fracture of left humerus 06/24/2014    Past Surgical History  Procedure Laterality Date  . Pancreatectomy    . Hernia repair      incisional  . Colonoscopy    . Tonsillectomy    . Tubal ligation    . Diagnostic laparoscopy      PMH: Exploratory lap  . Orif humerus fracture Left 06/24/2014    Procedure: LEFT OPEN REDUCTION INTERNAL FIXATION (ORIF) PROXIMAL HUMERUS FRACTURE;  Surgeon: Johnny Bridge, MD;  Location: [facility];  Service: Orthopedics;  Laterality: Left;    There were no  vitals filed for this visit.  Visit Diagnosis:  Abnormality of gait  Balance problems  Generalized anxiety disorder      Subjective Assessment - 02/23/15 1230    Subjective Went to [facility] yesterday and did chair Yoga class. She reports she enjoyed it.   Currently in Pain? No/denies                         [facility] Adult PT Treatment/Exercise - 02/23/15 1230    Transfers   Transfers Sit to Stand;Stand to Sit   Sit to Stand 5: Supervision;With upper extremity assist;With armrests;From chair/3-in-1  to RW for stabilization   Stand to Sit 5: Supervision;With upper extremity assist;With armrests;To chair/3-in-1  from RW   Stand Pivot Transfers 4: Min guard  with RW to Rt & to Lt with verbal cues & visual (laser) LEs   Stand Pivot Transfer Details (indicate cue type and reason) laser pointer for foot placement, verbal cues to sequence/ technique   Ambulation/Gait   Ambulation/Gait Yes   Ambulation/Gait Assistance 4: Min assist   Ambulation/Gait Assistance Details laser pointer for visual cues, verbal cues on stepping up on "step" to break up freezing    Ambulation Distance (Feet) 50 Feet  50', 35' & 20'   Assistive device  Rolling walker   Gait Pattern Step-to pattern;Festinating;Decreased step length - right;Decreased weight shift to right   Ambulation Surface Indoor;Level   Stairs Yes   Stairs Assistance 5: Supervision   Stair Management Technique Two rails;Step to pattern;Forwards   Number of Stairs 4   Height of Stairs 6   Dynamic Standing Balance   Dynamic Standing - Balance Support Bilateral upper extremity supported  on RW   Dynamic Standing - Level of Assistance 5: Stand by assistance   Dynamic Standing - Balance Activities Alternating  foot traps  Alternating stepping forward to target of colored circles   Knee/Hip Exercises: Aerobic   Stationary Bike Nustep level 5 x 7" with UE's and LE's                PT Education - 02/23/15  1230    Education provided Yes   Education Details When "freezes" cue to pretend to step up on step like stairs   Person(s) Educated Patient;Caregiver(s)   Methods Explanation;Demonstration   Comprehension Verbalized understanding;Returned demonstration          PT Short Term Goals - 01/25/15 1024    PT SHORT TERM GOAL #1   Title Patient with CNA assistance will demonstrate understanding of initial HEP to improve flexibility and balance. (Target Date: 12/31/14)   Status Achieved   PT SHORT TERM GOAL #3   Title Stands with walker support and manages pants for toileting with min guard.  (Target Date: 12/31/14)   Status Achieved   PT SHORT TERM GOAL #4   Title Patient reports left shoulder pain with standing & gait </= 6/10  (Target Date: 12/31/14)   Baseline reports 5-6/10 intensity   Status Achieved           PT Long Term Goals - 02/01/15 0917    PT LONG TERM GOAL #1   Title Patient demonstrates with caregiver's cueing progressive HEP correctly. (Target Date: 01/28/15)   Baseline 01-31-15 met   Status Achieved   PT LONG TERM GOAL #2   Title Patient ambulates 200' with rolling walker with CNA supervision safely.  (Target Date: 01/28/15)   Baseline 01-31-15   Status Not Met   PT LONG TERM GOAL #3   Title Patient will stand with rolling walker support, reach 4" anterior, look over her shoulders and manage her pants for toileting with supervision.  (Target Date: 01/28/15)  NEW TARGET DATE 03-03-15   Baseline inconsistent performance - 01-31-15 - LTG ongoing   Time 4   Period Weeks   Status On-going   PT LONG TERM GOAL #4   Title Patient and CNA will verbalize fall prevention strategies.  (Target Date: 01/28/15)   Baseline Information given 01-31-15   Time 4   Period Weeks   Status On-going   PT LONG TERM GOAL #5   Title Patient reports left shoulder pain </= 5/10 with standing & gait.  (Target Date: 01/28/15)   Baseline pt reports pain continues - intensity varies   Time 4   Period Weeks    Status Not Met   PT LONG TERM GOAL #6   Title Amb. 68' with RW with CGA with use of light for targets prn  (03-03-15)   Time 4   Period Weeks   Status New   PT LONG TERM GOAL #7   Title Pt will report amb. at home with RW with CNA's assistance at least 20' for incr. household ambulation.  (03-03-15)   Time 4   Period  Weeks   Status New               Plan - 02/23/15 1230    Clinical Impression Statement Patient was able to work thru a "freeze" with cues to step up on pretend step. Patient ambulated further on first gait today but then became anxious on subsequent ambulations with shorter distances.   Pt will benefit from skilled therapeutic intervention in order to improve on the following deficits Abnormal gait;Decreased balance;Decreased endurance;Decreased strength   Rehab Potential Fair   Clinical Impairments Affecting Rehab Potential anxiety; left arm/shoulder pain; generalized weakness and parkinsonism gait pattern   PT Frequency 2x / week   PT Duration 4 weeks   PT Treatment/Interventions Gait training;Balance training;Therapeutic exercise;Therapeutic activities;ADLs/Self Care Home Management;Neuromuscular re-education   PT Next Visit Plan cont. balance and gait - practice turns and transfers to chair with correct positioning   Consulted and Agree with Plan of Care Patient   Family Member Consulted CNA        Problem List Patient Active Problem List   Diagnosis Date Noted  . Well adult exam 12/08/2014  . Fracture of left humerus 06/24/2014  . Humerus fracture 06/24/2014  . Chest wall contusion 05/03/2014  . Acute bronchitis 11/10/2013  . Left elbow pain 11/03/2013  . Right facial pain 06/19/2013  . Ankle ulcer 04/02/2012  . Nausea & vomiting 10/05/2011  . Decubitus ulcer 06/26/2011  . Decubitus ulcer of coccyx 05/21/2011  . Cellulitis and abscess of other specified site 10/11/2010  . B12 deficiency 04/04/2010  . HYPERLIPIDEMIA 04/04/2010  . ANXIETY  11/14/2009  . Vitamin D deficiency 10/21/2009  . MELENA 10/21/2009  . DM2 (diabetes mellitus, type 2) 07/07/2009  . FEVER, RECURRENT 06/21/2009  . COPD 02/25/2009  . OSTEOARTHRITIS 02/25/2009  . LOW BACK PAIN 02/25/2009  . TOBACCO USER 01/13/2009  . Rash and other nonspecific skin eruption 01/13/2009  . FURUNCLE 06/08/2008  . DEPRESSION 02/26/2007  . PAIN, CHRONIC NEC 02/26/2007  . VENOUS INSUFFICIENCY, LEGS 02/26/2007  . GERD 02/26/2007  . HERNIA, INCISIONAL 02/26/2007  . SHOULDER PAIN, LEFT 02/26/2007  . SEIZURE DISORDER 02/26/2007  . INSOMNIA 02/26/2007  . Abnormality of gait 02/26/2007  . LEG EDEMA, CHRONIC 02/26/2007    Jamey Reas PT, DPT 02/23/2015, 9:20 PM  [facility] [address] [place], [place], [address] Phone: [phone]   Fax:  [phone]

## 2015-03-01 ENCOUNTER — Ambulatory Visit: Payer: Medicare Other | Admitting: Physical Therapy

## 2015-03-01 DIAGNOSIS — R269 Unspecified abnormalities of gait and mobility: Secondary | ICD-10-CM | POA: Diagnosis not present

## 2015-03-01 DIAGNOSIS — M79622 Pain in left upper arm: Secondary | ICD-10-CM | POA: Diagnosis not present

## 2015-03-01 DIAGNOSIS — M545 Low back pain: Secondary | ICD-10-CM | POA: Diagnosis not present

## 2015-03-01 DIAGNOSIS — F411 Generalized anxiety disorder: Secondary | ICD-10-CM | POA: Diagnosis not present

## 2015-03-01 DIAGNOSIS — R2689 Other abnormalities of gait and mobility: Secondary | ICD-10-CM

## 2015-03-02 ENCOUNTER — Encounter: Payer: Self-pay | Admitting: Physical Therapy

## 2015-03-02 NOTE — Therapy (Signed)
[facility] [address] [name], [place], [address] Phone: [phone]   Fax:  [phone]  Physical Therapy Treatment  Patient Details  Name: [name] MRN: [id number] Date of Birth: 01/02/1951 Referring Provider:  Cassandria Anger, MD  Encounter Date: 03/01/2015      PT End of Session - 03/02/15 1619    Visit Number 62  G7   Number of Visits 18   Date for PT Re-Evaluation 03/03/15   Authorization Type Medicare   Authorization Time Period 01-31-15 - 04-01-15   PT Start Time 1402   PT Stop Time 1448   PT Time Calculation (min) 46 min      Past Medical History  Diagnosis Date  . Depression   . GERD (gastroesophageal reflux disease)   . Seizure disorder   . Gait disorder   . Anoxic brain injury   . Chronic neck pain   . Chronic pain in shoulder   . COPD (chronic obstructive pulmonary disease)   . LBP (low back pain)   . Osteoarthritis   . Type II or unspecified type diabetes mellitus without mention of complication, not stated as uncontrolled 2010  . Anxiety   . Vitamin B 12 deficiency 2011  . Hyperlipidemia   . Seizures   . 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 Hernia of abdominal cavity   . Neuropathy   . Fracture of left humerus 06/24/2014    Past Surgical History  Procedure Laterality Date  . Pancreatectomy    . Hernia repair      incisional  . Colonoscopy    . Tonsillectomy    . Tubal ligation    . Diagnostic laparoscopy      PMH: Exploratory lap  . Orif humerus fracture Left 06/24/2014    Procedure: LEFT OPEN REDUCTION INTERNAL FIXATION (ORIF) PROXIMAL HUMERUS FRACTURE;  Surgeon: Johnny Bridge, MD;  Location: [facility];  Service: Orthopedics;  Laterality: Left;    There were no vitals filed for this visit.  Visit Diagnosis:  Abnormality of gait  Balance problems      Subjective Assessment - 03/02/15 1613    Subjective Pt. states she walked  alot in the [place] this weekend - holding the rail and her husband's hand in her other hand; states she did really well with walking; pt also states she went to chair yoga this am before PT   Pertinent History Patient has a Physiological scientist that stays with her up to 8 hours a day; pt is unable to stand /walk w/o supervision;    Patient Stated Goals I want to walk well again so I can get around my home   Currently in Pain? No/denies                         [facility] Adult PT Treatment/Exercise - 03/02/15 0001    Transfers   Transfers Sit to Stand;Stand to Sit   Sit to Stand 5: Supervision;With upper extremity assist;With armrests;From chair/3-in-1  to RW for stabilization   Stand to Sit 5: Supervision;With upper extremity assist;With armrests;To chair/3-in-1  from RW   Stand Pivot Transfers 4: Min guard  with RW to Rt & to Lt with verbal cues & visual (laser) LEs   Stand Pivot Transfer Details (indicate cue type and reason) verbal cues for weight shift   Ambulation/Gait   Ambulation/Gait Yes   Ambulation/Gait Assistance 4: Min assist  Ambulation/Gait Assistance Details flashlight used fot visual cues   Ambulation Distance (Feet) 60 Feet  initial rep; then seated rest break followed by 20' then 40'   Assistive device Rolling walker   Gait Pattern Step-to pattern;Festinating;Decreased step length - right;Decreased weight shift to right   Ambulation Surface Level   Stairs Yes   Knee/Hip Exercises: Aerobic   Stationary Bike Nustep level 5 x 5" with UE and LE's     Self Care; discussed need to continue walking in places with rail, i.e. [place] and gym as able with RW and with assistance To continue to try to overcome fear of falling and anxiety with standing and ambulating             PT Short Term Goals - 01/25/15 1024    PT SHORT TERM GOAL #1   Title Patient with CNA assistance will demonstrate understanding of initial HEP to improve flexibility and balance.  (Target Date: 12/31/14)   Status Achieved   PT SHORT TERM GOAL #3   Title Stands with walker support and manages pants for toileting with min guard.  (Target Date: 12/31/14)   Status Achieved   PT SHORT TERM GOAL #4   Title Patient reports left shoulder pain with standing & gait </= 6/10  (Target Date: 12/31/14)   Baseline reports 5-6/10 intensity   Status Achieved           PT Long Term Goals - 02/01/15 0917    PT LONG TERM GOAL #1   Title Patient demonstrates with caregiver's cueing progressive HEP correctly. (Target Date: 01/28/15)   Baseline 01-31-15 met   Status Achieved   PT LONG TERM GOAL #2   Title Patient ambulates 200' with rolling walker with CNA supervision safely.  (Target Date: 01/28/15)   Baseline 01-31-15   Status Not Met   PT LONG TERM GOAL #3   Title Patient will stand with rolling walker support, reach 4" anterior, look over her shoulders and manage her pants for toileting with supervision.  (Target Date: 01/28/15)  NEW TARGET DATE 03-03-15   Baseline inconsistent performance - 01-31-15 - LTG ongoing   Time 4   Period Weeks   Status On-going   PT LONG TERM GOAL #4   Title Patient and CNA will verbalize fall prevention strategies.  (Target Date: 01/28/15)   Baseline Information given 01-31-15   Time 4   Period Weeks   Status On-going   PT LONG TERM GOAL #5   Title Patient reports left shoulder pain </= 5/10 with standing & gait.  (Target Date: 01/28/15)   Baseline pt reports pain continues - intensity varies   Time 4   Period Weeks   Status Not Met   PT LONG TERM GOAL #6   Title Amb. 39' with RW with CGA with use of light for targets prn  (03-03-15)   Time 4   Period Weeks   Status New   PT LONG TERM GOAL #7   Title Pt will report amb. at home with RW with CNA's assistance at least 20' for incr. household ambulation.  (03-03-15)   Time 4   Period Weeks   Status New               Plan - 03/02/15 1621    Clinical Impression Statement Performance continues to  fluctuate with pt demonstrating less freezing at times than at other times during gait; anxiety and fear of falling continue to limit mobility; pt. does well with  use of hand rail due to security and more stability   Pt will benefit from skilled therapeutic intervention in order to improve on the following deficits Abnormal gait;Decreased balance;Decreased endurance;Decreased strength   Rehab Potential Fair   Clinical Impairments Affecting Rehab Potential anxiety; left arm/shoulder pain; generalized weakness and parkinsonism gait pattern   PT Frequency 2x / week   PT Duration 4 weeks   PT Treatment/Interventions Gait training;Balance training;Therapeutic exercise;Therapeutic activities;ADLs/Self Care Home Management;Neuromuscular re-education   PT Next Visit Plan check LTG's - D/C?   PT Home Exercise Plan balance exercises and standing; while sitting, pt instructed to weight bear through left arm onto lower bar of RW   Consulted and Agree with Plan of Care Patient   Family Member Consulted CNA        Problem List Patient Active Problem List   Diagnosis Date Noted  . Well adult exam 12/08/2014  . Fracture of left humerus 06/24/2014  . Humerus fracture 06/24/2014  . Chest wall contusion 05/03/2014  . Acute bronchitis 11/10/2013  . Left elbow pain 11/03/2013  . Right facial pain 06/19/2013  . Ankle ulcer 04/02/2012  . Nausea & vomiting 10/05/2011  . Decubitus ulcer 06/26/2011  . Decubitus ulcer of coccyx 05/21/2011  . Cellulitis and abscess of other specified site 10/11/2010  . B12 deficiency 04/04/2010  . HYPERLIPIDEMIA 04/04/2010  . ANXIETY 11/14/2009  . Vitamin D deficiency 10/21/2009  . MELENA 10/21/2009  . DM2 (diabetes mellitus, type 2) 07/07/2009  . FEVER, RECURRENT 06/21/2009  . COPD 02/25/2009  . OSTEOARTHRITIS 02/25/2009  . LOW BACK PAIN 02/25/2009  . TOBACCO USER 01/13/2009  . Rash and other nonspecific skin eruption 01/13/2009  . FURUNCLE 06/08/2008  . DEPRESSION  02/26/2007  . PAIN, CHRONIC NEC 02/26/2007  . VENOUS INSUFFICIENCY, LEGS 02/26/2007  . GERD 02/26/2007  . HERNIA, INCISIONAL 02/26/2007  . SHOULDER PAIN, LEFT 02/26/2007  . SEIZURE DISORDER 02/26/2007  . INSOMNIA 02/26/2007  . Abnormality of gait 02/26/2007  . LEG EDEMA, CHRONIC 02/26/2007    Alda Lea, PT 03/02/2015, 4:25 PM  [facility] [address] [place], [place], [address] Phone: [phone]   Fax:  [phone]

## 2015-03-03 ENCOUNTER — Ambulatory Visit: Payer: Medicare Other | Attending: Neurology | Admitting: Physical Therapy

## 2015-03-03 ENCOUNTER — Encounter: Payer: Self-pay | Admitting: Physical Therapy

## 2015-03-03 DIAGNOSIS — R2689 Other abnormalities of gait and mobility: Secondary | ICD-10-CM

## 2015-03-03 DIAGNOSIS — R269 Unspecified abnormalities of gait and mobility: Secondary | ICD-10-CM | POA: Diagnosis not present

## 2015-03-03 DIAGNOSIS — R29818 Other symptoms and signs involving the nervous system: Secondary | ICD-10-CM | POA: Diagnosis not present

## 2015-03-03 DIAGNOSIS — M79622 Pain in left upper arm: Secondary | ICD-10-CM | POA: Insufficient documentation

## 2015-03-03 DIAGNOSIS — F411 Generalized anxiety disorder: Secondary | ICD-10-CM

## 2015-03-03 DIAGNOSIS — M25522 Pain in left elbow: Secondary | ICD-10-CM

## 2015-03-04 ENCOUNTER — Encounter: Payer: Self-pay | Admitting: Physical Therapy

## 2015-03-04 NOTE — Therapy (Addendum)
[facility] [address] [place], [place], [address] Phone: [phone]   Fax:  [phone]  Physical Therapy Treatment  Patient Details  Name: [name] MRN: [id number] Date of Birth: 08-10-1951 Referring Provider:  Cassandria Anger, MD  Encounter Date: 03/03/2015      PT End of Session - 03/03/15 1230    Visit Number 76  G7   Number of Visits 18   Date for PT Re-Evaluation 03/03/15   Authorization Type Medicare   Authorization Time Period 01-31-15 - 04-01-15   PT Start Time 1230   PT Stop Time 1315   PT Time Calculation (min) 45 min   Activity Tolerance Patient tolerated treatment well   Behavior During Therapy [facility] for tasks assessed/performed      Past Medical History  Diagnosis Date  . Depression   . GERD (gastroesophageal reflux disease)   . Seizure disorder   . Gait disorder   . Anoxic brain injury   . Chronic neck pain   . Chronic pain in shoulder   . COPD (chronic obstructive pulmonary disease)   . LBP (low back pain)   . Osteoarthritis   . Type II or unspecified type diabetes mellitus without mention of complication, not stated as uncontrolled 2010  . Anxiety   . Vitamin B 12 deficiency 2011  . Hyperlipidemia   . Seizures   . 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 Hernia of abdominal cavity   . Neuropathy   . Fracture of left humerus 06/24/2014    Past Surgical History  Procedure Laterality Date  . Pancreatectomy    . Hernia repair      incisional  . Colonoscopy    . Tonsillectomy    . Tubal ligation    . Diagnostic laparoscopy      PMH: Exploratory lap  . Orif humerus fracture Left 06/24/2014    Procedure: LEFT OPEN REDUCTION INTERNAL FIXATION (ORIF) PROXIMAL HUMERUS FRACTURE;  Surgeon: Johnny Bridge, MD;  Location: [facility];  Service: Orthopedics;  Laterality: Left;    There were no vitals filed for this visit.  Visit Diagnosis:  Abnormality  of gait  Balance problems  Generalized anxiety disorder  Pain in joint, upper arm, left      Subjective Assessment - 03/03/15 1230    Subjective Went to [facility] and learned set-up for recumbent stepper. Walked to/from bathroom & bedroom with CNA   Currently in Pain? No/denies     Therapeutic Exercise: See patient education. PT instructed in activities for each component of fitness program. CNA & pt verbalized understanding.  Therapeutic Activities: Patient sit to / from stand with SBA from w/c to RW using UEs on armrest. Patient able to reach 10", look over her shoulders & manage clothes with RW support with Supervision. CNA reports no issues with managing clothes in bathroom. PT instructed pt & CNA not to donne shirts or sweatshirts that pull over the head in standing, both verbalized understanding.  Gait Training: Patient ambulated 49' X 2 with RW with CGA with laser light for target to facilitate movement. Patient negotiated stairs with 1 rail & other UE on wall (simulate home set-up) with CGA. CNA & pt verbalized how to perform at home.                           PT Education - 03/03/15 1230    Education  provided Yes   Education Details 4 components (strength, flexibility, endurance, balance) for well-rounded fitness plan,    Person(s) Educated Patient;Caregiver(s)   Methods Explanation   Comprehension Verbalized understanding          PT Short Term Goals - 01/25/15 1024    PT SHORT TERM GOAL #1   Title Patient with CNA assistance will demonstrate understanding of initial HEP to improve flexibility and balance. (Target Date: 12/31/14)   Status Achieved   PT SHORT TERM GOAL #3   Title Stands with walker support and manages pants for toileting with min guard.  (Target Date: 12/31/14)   Status Achieved   PT SHORT TERM GOAL #4   Title Patient reports left shoulder pain with standing & gait </= 6/10  (Target Date: 12/31/14)   Baseline reports 5-6/10  intensity   Status Achieved           PT Long Term Goals - 03/03/15 1230    PT LONG TERM GOAL #1   Title Patient demonstrates with caregiver's cueing progressive HEP correctly. (Target Date: 01/28/15)   Baseline 01-31-15 met   Status Achieved   PT LONG TERM GOAL #2   Title Patient ambulates 200' with rolling walker with CNA supervision safely.  (Target Date: 01/28/15)   Baseline 01-31-15   Status Not Met   PT LONG TERM GOAL #3   Title Patient will stand with rolling walker support, reach 4" anterior, look over her shoulders and manage her pants for toileting with supervision.  (Target Date: 01/28/15)  NEW TARGET DATE 03-03-15   Baseline MET 03/03/2015   Time 4   Period Weeks   Status Achieved   PT LONG TERM GOAL #4   Title Patient and CNA will verbalize fall prevention strategies.  (Target Date: 01/28/15)   Baseline MET 03/03/2015   Time 4   Period Weeks   Status Achieved   PT LONG TERM GOAL #5   Title Patient reports left shoulder pain </= 5/10 with standing & gait.  (Target Date: 01/28/15)   Baseline MET 03/03/2015 with report of no pain over last week or more   Time 4   Period Weeks   Status Achieved   PT LONG TERM GOAL #6   Title Amb. 87' with RW with CGA with use of light for targets prn  (03-03-15)   Baseline MET 03/03/2015   Time 4   Period Weeks   Status Achieved   PT LONG TERM GOAL #7   Title Pt will report amb. at home with RW with CNA's assistance at least 20' for incr. household ambulation.  (03-03-15)   Baseline MET 03/03/2015   Time 4   Period Weeks   Status Achieved               Plan - 03/03/15 1230    Clinical Impression Statement Patient met all LTGs. Patient & CNA seem to understand ongoing fitness plan with plans to exercise at [facility].   Pt will benefit from skilled therapeutic intervention in order to improve on the following deficits Abnormal gait;Decreased balance;Decreased endurance;Decreased strength   Rehab Potential Fair    Clinical Impairments Affecting Rehab Potential anxiety; left arm/shoulder pain; generalized weakness and parkinsonism gait pattern   PT Frequency 2x / week   PT Duration 4 weeks   PT Treatment/Interventions Gait training;Balance training;Therapeutic exercise;Therapeutic activities;ADLs/Self Care Home Management;Neuromuscular re-education   PT Next Visit Plan Discharge   Consulted and Agree with Plan of Care Patient  Family Member Consulted CNA          G-Codes - Mar 22, 2015 1230    Functional Assessment Tool Used Patient ambulates 33' with RW with contact assist.   Functional Limitation Changing and maintaining body position   Changing and Maintaining Body Position Goal Status [phone]) At least 40 percent but less than 60 percent impaired, limited or restricted   Changing and Maintaining Body Position Discharge Status ([id number]) At least 40 percent but less than 60 percent impaired, limited or restricted      Problem List Patient Active Problem List   Diagnosis Date Noted  . Well adult exam 12/08/2014  . Fracture of left humerus 06/24/2014  . Humerus fracture 06/24/2014  . Chest wall contusion 05/03/2014  . Acute bronchitis 11/10/2013  . Left elbow pain 11/03/2013  . Right facial pain 06/19/2013  . Ankle ulcer 04/02/2012  . Nausea & vomiting 10/05/2011  . Decubitus ulcer 06/26/2011  . Decubitus ulcer of coccyx 05/21/2011  . Cellulitis and abscess of other specified site 10/11/2010  . B12 deficiency 04/04/2010  . HYPERLIPIDEMIA 04/04/2010  . ANXIETY 11/14/2009  . Vitamin D deficiency 10/21/2009  . MELENA 10/21/2009  . DM2 (diabetes mellitus, type 2) 07/07/2009  . FEVER, RECURRENT 06/21/2009  . COPD 02/25/2009  . OSTEOARTHRITIS 02/25/2009  . LOW BACK PAIN 02/25/2009  . TOBACCO USER 01/13/2009  . Rash and other nonspecific skin eruption 01/13/2009  . FURUNCLE 06/08/2008  . DEPRESSION 02/26/2007  . PAIN, CHRONIC NEC 02/26/2007  . VENOUS INSUFFICIENCY, LEGS 02/26/2007  . GERD  02/26/2007  . HERNIA, INCISIONAL 02/26/2007  . SHOULDER PAIN, LEFT 02/26/2007  . SEIZURE DISORDER 02/26/2007  . INSOMNIA 02/26/2007  . Abnormality of gait 02/26/2007  . LEG EDEMA, CHRONIC 02/26/2007    Jamey Reas PT, DPT 03/04/2015, 11:09 AM  [place] [address] [place], [place], [address] Phone: [phone]   Fax:  [phone]

## 2015-03-04 NOTE — Therapy (Signed)
[facility] [address] [place], [place], [address] Phone: [phone]   Fax:  [phone]  Patient Details  Name: [name] MRN: [id number] Date of Birth: 25-Dec-1950 Referring Provider:  No ref. provider found  Encounter Date: 03/04/2015  PHYSICAL THERAPY DISCHARGE SUMMARY  Visits from Start of Care: 18  Current functional level related to goals / functional outcomes:     PT Long Term Goals - 03/03/15 1230    PT LONG TERM GOAL #1   Title Patient demonstrates with caregiver's cueing progressive HEP correctly. (Target Date: 01/28/15)   Baseline 01-31-15 met   Status Achieved   PT LONG TERM GOAL #2   Title Patient ambulates 200' with rolling walker with CNA supervision safely.  (Target Date: 01/28/15)   Baseline 01-31-15   Status Not Met   PT LONG TERM GOAL #3   Title Patient will stand with rolling walker support, reach 4" anterior, look over her shoulders and manage her pants for toileting with supervision.  (Target Date: 01/28/15)  NEW TARGET DATE 03-03-15   Baseline MET 03/03/2015   Time 4   Period Weeks   Status Achieved   PT LONG TERM GOAL #4   Title Patient and CNA will verbalize fall prevention strategies.  (Target Date: 01/28/15)   Baseline MET 03/03/2015   Time 4   Period Weeks   Status Achieved   PT LONG TERM GOAL #5   Title Patient reports left shoulder pain </= 5/10 with standing & gait.  (Target Date: 01/28/15)   Baseline MET 03/03/2015 with report of no pain over last week or more   Time 4   Period Weeks   Status Achieved   PT LONG TERM GOAL #6   Title Amb. 26' with RW with CGA with use of light for targets prn  (03-03-15)   Baseline MET 03/03/2015   Time 4   Period Weeks   Status Achieved   PT LONG TERM GOAL #7   Title Pt will report amb. at home with RW with CNA's assistance at least 20' for incr. household ambulation.  (03-03-15)   Baseline MET 03/03/2015   Time 4   Period Weeks   Status Achieved        Remaining deficits: Patient continues to have neurological movement patterns similar to Parkinson's and needs assistance for gait. Patient appears to be functioning at prior level of function to her fall that fractured her humerus.   Education / Equipment: HEP/ fitness program. Patient has joined [employer] and plans to continue ongoing fitness activities.  Plan: Patient agrees to discharge.  Patient goals were met. Patient is being discharged due to meeting the stated rehab goals.  ?????       Alisia Vanengen PT, DPT 03/04/2015, 10:20 AM  [place] [address] [place], [place], [address] Phone: [phone]   Fax:  [phone]

## 2015-03-11 ENCOUNTER — Other Ambulatory Visit (INDEPENDENT_AMBULATORY_CARE_PROVIDER_SITE_OTHER): Payer: Medicare Other

## 2015-03-11 ENCOUNTER — Encounter: Payer: Self-pay | Admitting: Internal Medicine

## 2015-03-11 ENCOUNTER — Ambulatory Visit (INDEPENDENT_AMBULATORY_CARE_PROVIDER_SITE_OTHER): Payer: Medicare Other | Admitting: Internal Medicine

## 2015-03-11 VITALS — BP 130/74 | HR 99 | Wt 129.0 lb

## 2015-03-11 DIAGNOSIS — E785 Hyperlipidemia, unspecified: Secondary | ICD-10-CM

## 2015-03-11 DIAGNOSIS — E559 Vitamin D deficiency, unspecified: Secondary | ICD-10-CM

## 2015-03-11 DIAGNOSIS — E1121 Type 2 diabetes mellitus with diabetic nephropathy: Secondary | ICD-10-CM | POA: Diagnosis not present

## 2015-03-11 DIAGNOSIS — E538 Deficiency of other specified B group vitamins: Secondary | ICD-10-CM

## 2015-03-11 DIAGNOSIS — Z0000 Encounter for general adult medical examination without abnormal findings: Secondary | ICD-10-CM | POA: Diagnosis not present

## 2015-03-11 DIAGNOSIS — E118 Type 2 diabetes mellitus with unspecified complications: Secondary | ICD-10-CM

## 2015-03-11 DIAGNOSIS — Z79899 Other long term (current) drug therapy: Secondary | ICD-10-CM | POA: Diagnosis not present

## 2015-03-11 LAB — LIPID PANEL
Cholesterol: 203 mg/dL — ABNORMAL HIGH (ref 0–200)
HDL: 72.2 mg/dL (ref >39.00)
LDL Cholesterol: 113 mg/dL — ABNORMAL HIGH (ref 0–99)
NonHDL: 130.8
Total CHOL/HDL Ratio: 3
Triglycerides: 88 mg/dL (ref 0.0–149.0)
VLDL: 17.6 mg/dL (ref 0.0–40.0)

## 2015-03-11 LAB — BASIC METABOLIC PANEL
BUN: 8 mg/dL (ref 6–23)
CO2: 26 mEq/L (ref 19–32)
Calcium: 9.3 mg/dL (ref 8.4–10.5)
Chloride: 97 mEq/L (ref 96–112)
Creatinine, Ser: 0.44 mg/dL (ref 0.40–1.20)
GFR: 152.75 mL/min (ref >60.00)
Glucose, Bld: 153 mg/dL — ABNORMAL HIGH (ref 70–99)
Potassium: 4.1 mEq/L (ref 3.5–5.1)
Sodium: 132 mEq/L — ABNORMAL LOW (ref 135–145)

## 2015-03-11 LAB — CBC WITH DIFFERENTIAL/PLATELET
Basophils Absolute: 0 10*3/uL (ref 0.0–0.1)
Basophils Relative: 0.2 % (ref 0.0–3.0)
Eosinophils Absolute: 0.1 10*3/uL (ref 0.0–0.7)
Eosinophils Relative: 0.9 % (ref 0.0–5.0)
HCT: 45.1 % (ref 36.0–46.0)
Hemoglobin: 15.1 g/dL — ABNORMAL HIGH (ref 12.0–15.0)
Lymphocytes Relative: 19.5 % (ref 12.0–46.0)
Lymphs Abs: 1.7 10*3/uL (ref 0.7–4.0)
MCHC: 33.4 g/dL (ref 30.0–36.0)
MCV: 90.5 fl (ref 78.0–100.0)
Monocytes Absolute: 0.6 10*3/uL (ref 0.1–1.0)
Monocytes Relative: 6.3 % (ref 3.0–12.0)
Neutro Abs: 6.5 10*3/uL (ref 1.4–7.7)
Neutrophils Relative %: 73.1 % (ref 43.0–77.0)
Platelets: 233 10*3/uL (ref 150.0–400.0)
RBC: 4.98 Mil/uL (ref 3.87–5.11)
RDW: 15.4 % (ref 11.5–15.5)
WBC: 8.8 10*3/uL (ref 4.0–10.5)

## 2015-03-11 LAB — HEPATIC FUNCTION PANEL
ALT: 2 U/L (ref 0–35)
AST: 12 U/L (ref 0–37)
Albumin: 4.1 g/dL (ref 3.5–5.2)
Alkaline Phosphatase: 80 U/L (ref 39–117)
Bilirubin, Direct: 0.1 mg/dL (ref 0.0–0.3)
Total Bilirubin: 0.4 mg/dL (ref 0.2–1.2)
Total Protein: 6.5 g/dL (ref 6.0–8.3)

## 2015-03-11 LAB — TSH: TSH: 1.15 u[IU]/mL (ref 0.35–4.50)

## 2015-03-11 LAB — HEMOGLOBIN A1C: Hgb A1c MFr Bld: 7 % — ABNORMAL HIGH (ref 4.6–6.5)

## 2015-03-11 MED ORDER — ZOLPIDEM TARTRATE 10 MG PO TABS: 10.0000 mg | ORAL_TABLET | Freq: Every evening | ORAL | Status: DC | PRN

## 2015-03-11 MED ORDER — MIRTAZAPINE 15 MG PO TABS: 15.0000 mg | ORAL_TABLET | Freq: Every day | ORAL | Status: DC

## 2015-03-11 MED ORDER — CARBIDOPA-LEVODOPA 25-250 MG PO TABS: 1.0000 | ORAL_TABLET | Freq: Three times a day (TID) | ORAL | Status: DC

## 2015-03-11 MED ORDER — LORAZEPAM 2 MG PO TABS: ORAL_TABLET | ORAL | Status: DC

## 2015-03-11 NOTE — Progress Notes (Signed)
Pre visit review using our clinic review tool, if applicable. No additional management support is needed unless otherwise documented below in the visit note.   Subjective:    HPI  F/u R outer ankle sore x 12 mo - healing. She has been hitting R ankle on a wooden part of her recliner - better  Smoking  1-1/2 ppd  The patient presents for a follow-up of  chronic hypertension, chronic dyslipidemia, type 2 diabetes controlled with medicines  F/u wt loss  better  Wt Readings from Last 3 Encounters:  03/11/15 129 lb (58.514 kg)  12/08/14 130 lb (58.968 kg)  11/19/14 130 lb 3 oz (59.053 kg)   BP Readings from Last 3 Encounters:  03/11/15 130/74  01/21/15 118/70  12/08/14 140/82     Review of Systems  Constitutional: Positive for fatigue. Negative for activity change, appetite change and unexpected weight change.  HENT: Negative for congestion, mouth sores and sinus pressure.   Eyes: Negative for visual disturbance.  Respiratory: Negative for chest tightness.   Gastrointestinal: Negative for nausea.  Genitourinary: Negative for frequency, difficulty urinating and vaginal pain.  Musculoskeletal: Positive for gait problem. Negative for back pain.  Skin: Positive for wound (R ankle). Negative for pallor and rash.  Neurological: Negative for tremors, weakness and numbness.  Psychiatric/Behavioral: Positive for sleep disturbance. Negative for suicidal ideas and confusion. The patient is nervous/anxious.        Objective:   Physical Exam  Constitutional: She appears well-developed and well-nourished. No distress.  HENT:  Head: Normocephalic.  Right Ear: External ear normal.  Left Ear: External ear normal.  Nose: Nose normal.  Mouth/Throat: Oropharynx is clear and moist.  Eyes: Conjunctivae are normal. Pupils are equal, round, and reactive to light. Right eye exhibits no discharge. Left eye exhibits no discharge.  Neck: Normal range of motion. Neck supple. No JVD present. No  tracheal deviation present. No thyromegaly present.  Cardiovascular: Normal rate, regular rhythm and normal heart sounds.   Pulmonary/Chest: No stridor. No respiratory distress. She has no wheezes.  Abdominal: Soft. Bowel sounds are normal. She exhibits no distension and no mass. There is no tenderness. There is no rebound and no guarding.  Musculoskeletal: She exhibits no edema or tenderness.  Lymphadenopathy:    She has no cervical adenopathy.  Neurological: She displays abnormal reflex. No cranial nerve deficit. She exhibits abnormal muscle tone. Coordination abnormal.  w/c  Skin: No rash noted. No erythema.  R lat malleolus ulcer - healed  Psychiatric: She has a normal mood and affect. Her behavior is normal. Judgment and thought content normal.  R popl art pulse is good Ankle arteries pulse is diminished some R foot w/trace edema  Lab Results  Component Value Date   WBC 8.0 06/25/2014   HGB 12.2 06/25/2014   HCT 37.3 06/25/2014   PLT 193 06/25/2014   GLUCOSE 136* 06/25/2014   CHOL 228* 10/26/2013   TRIG 98.0 10/26/2013   HDL 109.70 10/26/2013   LDLDIRECT 100.8 10/26/2013   ALT <5 06/24/2014   AST 23 06/24/2014   NA 139 06/25/2014   K 4.2 06/25/2014   CL 101 06/25/2014   CREATININE 0.39* 06/25/2014   BUN 7 06/25/2014   CO2 25 06/25/2014   TSH 1.75 10/26/2013   INR 1.0 ratio 10/21/2009   HGBA1C 6.7* 01/27/2014    R lat ankle ulcer is healed        Assessment & Plan:

## 2015-03-11 NOTE — Assessment & Plan Note (Signed)
Doing well 

## 2015-03-11 NOTE — Assessment & Plan Note (Signed)
On B12 

## 2015-03-11 NOTE — Assessment & Plan Note (Signed)
On Vit D 

## 2015-03-14 LAB — VITAMIN D 25 HYDROXY (VIT D DEFICIENCY, FRACTURES): VITD: 24.22 ng/mL — ABNORMAL LOW (ref 30.00–100.00)

## 2015-03-14 LAB — VITAMIN B12: Vitamin B-12: >1500 pg/mL — ABNORMAL HIGH (ref 211–911)

## 2015-03-15 ENCOUNTER — Other Ambulatory Visit: Payer: Self-pay | Admitting: Internal Medicine

## 2015-03-15 MED ORDER — ERGOCALCIFEROL 1.25 MG (50000 UT) PO CAPS: 50000.0000 [IU] | ORAL_CAPSULE | ORAL | Status: DC

## 2015-03-15 MED ORDER — VITAMIN D 1000 UNITS PO TABS: 2000.0000 [IU] | ORAL_TABLET | Freq: Every day | ORAL | Status: DC

## 2015-03-17 DIAGNOSIS — Z79891 Long term (current) use of opiate analgesic: Secondary | ICD-10-CM | POA: Diagnosis not present

## 2015-03-17 DIAGNOSIS — M47816 Spondylosis without myelopathy or radiculopathy, lumbar region: Secondary | ICD-10-CM | POA: Diagnosis not present

## 2015-03-17 DIAGNOSIS — M47817 Spondylosis without myelopathy or radiculopathy, lumbosacral region: Secondary | ICD-10-CM | POA: Diagnosis not present

## 2015-03-17 DIAGNOSIS — G894 Chronic pain syndrome: Secondary | ICD-10-CM | POA: Diagnosis not present

## 2015-04-14 DIAGNOSIS — M47817 Spondylosis without myelopathy or radiculopathy, lumbosacral region: Secondary | ICD-10-CM | POA: Diagnosis not present

## 2015-04-14 DIAGNOSIS — Z79891 Long term (current) use of opiate analgesic: Secondary | ICD-10-CM | POA: Diagnosis not present

## 2015-04-14 DIAGNOSIS — M47816 Spondylosis without myelopathy or radiculopathy, lumbar region: Secondary | ICD-10-CM | POA: Diagnosis not present

## 2015-04-14 DIAGNOSIS — G894 Chronic pain syndrome: Secondary | ICD-10-CM | POA: Diagnosis not present

## 2015-04-27 ENCOUNTER — Other Ambulatory Visit: Payer: Self-pay | Admitting: Internal Medicine

## 2015-05-10 ENCOUNTER — Other Ambulatory Visit: Payer: Self-pay | Admitting: *Deleted

## 2015-05-10 MED ORDER — PROMETHAZINE HCL 12.5 MG PO TABS: 12.5000 mg | ORAL_TABLET | Freq: Four times a day (QID) | ORAL | Status: DC | PRN

## 2015-05-16 DIAGNOSIS — Z79891 Long term (current) use of opiate analgesic: Secondary | ICD-10-CM | POA: Diagnosis not present

## 2015-05-16 DIAGNOSIS — G894 Chronic pain syndrome: Secondary | ICD-10-CM | POA: Diagnosis not present

## 2015-05-16 DIAGNOSIS — M47816 Spondylosis without myelopathy or radiculopathy, lumbar region: Secondary | ICD-10-CM | POA: Diagnosis not present

## 2015-05-24 ENCOUNTER — Telehealth: Payer: Self-pay

## 2015-05-24 NOTE — Telephone Encounter (Signed)
Spoke to pt and answered some of the maintenance questions.  Pt wants to call back and set up an appt for a diabetic foot exam. We will be able to set up labs for other overdue items  Pt is also interested in the shingles vaccine.

## 2015-05-26 MED ORDER — ZOSTER VACCINE LIVE 19400 UNT/0.65ML ~~LOC~~ SOLR: 0.6500 mL | Freq: Once | SUBCUTANEOUS | Status: DC

## 2015-06-13 ENCOUNTER — Other Ambulatory Visit: Payer: Self-pay | Admitting: Internal Medicine

## 2015-06-13 DIAGNOSIS — Z79891 Long term (current) use of opiate analgesic: Secondary | ICD-10-CM | POA: Diagnosis not present

## 2015-06-13 DIAGNOSIS — M47816 Spondylosis without myelopathy or radiculopathy, lumbar region: Secondary | ICD-10-CM | POA: Diagnosis not present

## 2015-06-13 DIAGNOSIS — G894 Chronic pain syndrome: Secondary | ICD-10-CM | POA: Diagnosis not present

## 2015-06-16 ENCOUNTER — Other Ambulatory Visit: Payer: Self-pay | Admitting: Internal Medicine

## 2015-07-11 DIAGNOSIS — G894 Chronic pain syndrome: Secondary | ICD-10-CM | POA: Diagnosis not present

## 2015-07-11 DIAGNOSIS — Z79891 Long term (current) use of opiate analgesic: Secondary | ICD-10-CM | POA: Diagnosis not present

## 2015-07-11 DIAGNOSIS — M47816 Spondylosis without myelopathy or radiculopathy, lumbar region: Secondary | ICD-10-CM | POA: Diagnosis not present

## 2015-08-08 DIAGNOSIS — Z79891 Long term (current) use of opiate analgesic: Secondary | ICD-10-CM | POA: Diagnosis not present

## 2015-08-08 DIAGNOSIS — G894 Chronic pain syndrome: Secondary | ICD-10-CM | POA: Diagnosis not present

## 2015-08-08 DIAGNOSIS — M47816 Spondylosis without myelopathy or radiculopathy, lumbar region: Secondary | ICD-10-CM | POA: Diagnosis not present

## 2015-08-17 ENCOUNTER — Other Ambulatory Visit: Payer: Self-pay | Admitting: *Deleted

## 2015-08-17 MED ORDER — CARBIDOPA-LEVODOPA 25-250 MG PO TABS: 1.0000 | ORAL_TABLET | Freq: Three times a day (TID) | ORAL | Status: DC

## 2015-08-18 ENCOUNTER — Telehealth: Payer: Self-pay

## 2015-08-18 NOTE — Telephone Encounter (Signed)
PA was started via covermymeds for medication Carbidopa levodopa. Key: [id number]

## 2015-09-05 DIAGNOSIS — Z79891 Long term (current) use of opiate analgesic: Secondary | ICD-10-CM | POA: Diagnosis not present

## 2015-09-05 DIAGNOSIS — M47816 Spondylosis without myelopathy or radiculopathy, lumbar region: Secondary | ICD-10-CM | POA: Diagnosis not present

## 2015-09-05 DIAGNOSIS — G894 Chronic pain syndrome: Secondary | ICD-10-CM | POA: Diagnosis not present

## 2015-09-14 ENCOUNTER — Other Ambulatory Visit: Payer: Self-pay | Admitting: Internal Medicine

## 2015-09-15 NOTE — Telephone Encounter (Signed)
[facility] 

## 2015-09-15 NOTE — Telephone Encounter (Signed)
Please advise, thanks.

## 2015-09-20 ENCOUNTER — Telehealth: Payer: Self-pay

## 2015-09-20 NOTE — Telephone Encounter (Signed)
PA initiated via covermymeds. PA key for promethazine is [id number]

## 2015-09-29 ENCOUNTER — Telehealth: Payer: Self-pay | Admitting: Internal Medicine

## 2015-09-29 MED ORDER — PROMETHAZINE HCL 12.5 MG PO TABS
12.5000 mg | ORAL_TABLET | Freq: Four times a day (QID) | ORAL | Status: DC | PRN
Start: 2015-09-29 — End: 2016-10-03

## 2015-09-29 NOTE — Telephone Encounter (Signed)
Done. See meds. Pt informed  

## 2015-09-29 NOTE — Telephone Encounter (Signed)
Pt is requesting refill for promethazine (PHENERGAN) 12.5 MG tablet [[id number]][[id number]]  Pharmacy [facility] [address]

## 2015-10-06 NOTE — Telephone Encounter (Signed)
Previous PA cancelled.  RENEWED PA and submitted accurate clinical information for the request.

## 2015-10-28 ENCOUNTER — Telehealth: Payer: Self-pay | Admitting: *Deleted

## 2015-10-28 MED ORDER — ZOLPIDEM TARTRATE 10 MG PO TABS: 10.0000 mg | ORAL_TABLET | Freq: Every evening | ORAL | Status: DC | PRN

## 2015-10-28 NOTE — Telephone Encounter (Signed)
Pt left msg on triage requesting refills on her ambien...[name]

## 2015-10-28 NOTE — Telephone Encounter (Signed)
OK to fill this prescription with additional refills x5 Thank you!  

## 2015-10-28 NOTE — Telephone Encounter (Signed)
Notified pt rx has been call to pharmacy...[name]

## 2015-10-31 DIAGNOSIS — Z79891 Long term (current) use of opiate analgesic: Secondary | ICD-10-CM | POA: Diagnosis not present

## 2015-10-31 DIAGNOSIS — G894 Chronic pain syndrome: Secondary | ICD-10-CM | POA: Diagnosis not present

## 2015-10-31 DIAGNOSIS — M47816 Spondylosis without myelopathy or radiculopathy, lumbar region: Secondary | ICD-10-CM | POA: Diagnosis not present

## 2015-11-03 DIAGNOSIS — L603 Nail dystrophy: Secondary | ICD-10-CM | POA: Diagnosis not present

## 2015-11-03 DIAGNOSIS — I739 Peripheral vascular disease, unspecified: Secondary | ICD-10-CM | POA: Diagnosis not present

## 2015-11-28 DIAGNOSIS — G894 Chronic pain syndrome: Secondary | ICD-10-CM | POA: Diagnosis not present

## 2015-11-28 DIAGNOSIS — Z79891 Long term (current) use of opiate analgesic: Secondary | ICD-10-CM | POA: Diagnosis not present

## 2015-11-28 DIAGNOSIS — M47816 Spondylosis without myelopathy or radiculopathy, lumbar region: Secondary | ICD-10-CM | POA: Diagnosis not present

## 2015-12-14 ENCOUNTER — Ambulatory Visit (INDEPENDENT_AMBULATORY_CARE_PROVIDER_SITE_OTHER): Payer: Medicare Other | Admitting: Ophthalmology

## 2015-12-28 ENCOUNTER — Ambulatory Visit (INDEPENDENT_AMBULATORY_CARE_PROVIDER_SITE_OTHER): Payer: Medicare Other | Admitting: Internal Medicine

## 2015-12-28 ENCOUNTER — Encounter: Payer: Self-pay | Admitting: Internal Medicine

## 2015-12-28 ENCOUNTER — Other Ambulatory Visit (INDEPENDENT_AMBULATORY_CARE_PROVIDER_SITE_OTHER): Payer: Medicare Other

## 2015-12-28 VITALS — BP 140/70 | HR 98 | Wt 134.0 lb

## 2015-12-28 DIAGNOSIS — E1100 Type 2 diabetes mellitus with hyperosmolarity without nonketotic hyperglycemic-hyperosmolar coma (NKHHC): Secondary | ICD-10-CM

## 2015-12-28 DIAGNOSIS — E538 Deficiency of other specified B group vitamins: Secondary | ICD-10-CM

## 2015-12-28 DIAGNOSIS — E559 Vitamin D deficiency, unspecified: Secondary | ICD-10-CM

## 2015-12-28 DIAGNOSIS — R269 Unspecified abnormalities of gait and mobility: Secondary | ICD-10-CM

## 2015-12-28 DIAGNOSIS — G931 Anoxic brain damage, not elsewhere classified: Secondary | ICD-10-CM

## 2015-12-28 LAB — CBC WITH DIFFERENTIAL/PLATELET
Basophils Absolute: 0.1 10*3/uL (ref 0.0–0.1)
Basophils Relative: 0.6 % (ref 0.0–3.0)
Eosinophils Absolute: 0.1 10*3/uL (ref 0.0–0.7)
Eosinophils Relative: 0.5 % (ref 0.0–5.0)
HCT: 44 % (ref 36.0–46.0)
Hemoglobin: 14.8 g/dL (ref 12.0–15.0)
Lymphocytes Relative: 20.4 % (ref 12.0–46.0)
Lymphs Abs: 2.5 10*3/uL (ref 0.7–4.0)
MCHC: 33.7 g/dL (ref 30.0–36.0)
MCV: 89.5 fl (ref 78.0–100.0)
Monocytes Absolute: 0.6 10*3/uL (ref 0.1–1.0)
Monocytes Relative: 5.1 % (ref 3.0–12.0)
Neutro Abs: 9.1 10*3/uL — ABNORMAL HIGH (ref 1.4–7.7)
Neutrophils Relative %: 73.4 % (ref 43.0–77.0)
Platelets: 241 10*3/uL (ref 150.0–400.0)
RBC: 4.91 Mil/uL (ref 3.87–5.11)
RDW: 15.5 % (ref 11.5–15.5)
WBC: 12.4 10*3/uL — ABNORMAL HIGH (ref 4.0–10.5)

## 2015-12-28 LAB — BASIC METABOLIC PANEL
BUN: 15 mg/dL (ref 6–23)
CO2: 30 mEq/L (ref 19–32)
Calcium: 9.4 mg/dL (ref 8.4–10.5)
Chloride: 97 mEq/L (ref 96–112)
Creatinine, Ser: 0.62 mg/dL (ref 0.40–1.20)
GFR: 102.57 mL/min (ref >60.00)
Glucose, Bld: 280 mg/dL — ABNORMAL HIGH (ref 70–99)
Potassium: 4.6 mEq/L (ref 3.5–5.1)
Sodium: 134 mEq/L — ABNORMAL LOW (ref 135–145)

## 2015-12-28 LAB — TSH: TSH: 1.43 u[IU]/mL (ref 0.35–4.50)

## 2015-12-28 LAB — VITAMIN D 25 HYDROXY (VIT D DEFICIENCY, FRACTURES): VITD: 25.8 ng/mL — ABNORMAL LOW (ref 30.00–100.00)

## 2015-12-28 LAB — HEMOGLOBIN A1C: Hgb A1c MFr Bld: 8.8 % — ABNORMAL HIGH (ref 4.6–6.5)

## 2015-12-28 LAB — VITAMIN B12: Vitamin B-12: 421 pg/mL (ref 211–911)

## 2015-12-28 NOTE — Assessment & Plan Note (Signed)
Labs

## 2015-12-28 NOTE — Progress Notes (Signed)
Pre visit review using our clinic review tool, if applicable. No additional management support is needed unless otherwise documented below in the visit note. 

## 2015-12-28 NOTE — Assessment & Plan Note (Signed)
Chronic - post-anoxic brain injury Worse PT at home

## 2015-12-28 NOTE — Assessment & Plan Note (Signed)
On Metformin 

## 2015-12-28 NOTE — Progress Notes (Signed)
Subjective:  Patient ID: [name], female    DOB: 10/12/1950  Age: 65 y.o. MRN: [id number]  CC: No chief complaint on file.   HPI [name] presents for chronic pain, gait disorder, anxiety f/u. C/o not being able to walk x 3 mo.   Outpatient Prescriptions Prior to Visit  Medication Sig Dispense Refill  . aspirin 81 MG EC tablet Take 81 mg by mouth daily.      . carbidopa-levodopa (SINEMET IR) 25-250 MG tablet Take 1 tablet by mouth 3 (three) times daily. 270 tablet 3  . cholecalciferol (VITAMIN D) 1000 UNITS tablet Take 2 tablets (2,000 Units total) by mouth daily. 100 tablet 3  . ergocalciferol (VITAMIN D2) 50000 UNITS capsule Take 1 capsule (50,000 Units total) by mouth once a week. 6 capsule 0  . HYDROmorphone (DILAUDID) 2 MG tablet Take 2-3 tablets (4-6 mg total) by mouth every 6 (six) hours as needed for moderate pain or severe pain. 75 tablet 0  . LORazepam (ATIVAN) 2 MG tablet TAKE 1/2 TO 1 TABLET BY MOUTH 3 TIMES DAILY 90 tablet 5  . metFORMIN (GLUCOPHAGE-XR) 500 MG 24 hr tablet TAKE 1 TABLET THREE TIMES A DAY 270 tablet 1  . mirtazapine (REMERON) 15 MG tablet Take 1 tablet (15 mg total) by mouth at bedtime. Take at 6-8 pm 90 tablet 3  . promethazine (PHENERGAN) 12.5 MG tablet Take 1 tablet (12.5 mg total) by mouth every 6 (six) hours as needed. for nausea 60 tablet 1  . tolterodine (DETROL LA) 4 MG 24 hr capsule Take 4 mg by mouth at bedtime.    Marland Kitchen zolpidem (AMBIEN) 10 MG tablet Take 1 tablet (10 mg total) by mouth at bedtime as needed. for sleep 30 tablet 5  . zoster vaccine live, PF, (ZOSTAVAX) 13244 UNT/0.65ML injection Inject 19,400 Units into the skin once. 1 each 0  . l-methylfolate-B6-B12 (METANX) 3-35-2 MG TABS Take 1 tablet by mouth 2 (two) times daily. (Patient not taking: Reported on 12/28/2015) 180 tablet 3  . morphine (MS CONTIN) 15 MG 12 hr tablet Take 15 mg by mouth every 12 (twelve) hours. Reported on 12/28/2015    . sennosides-docusate sodium (SENOKOT-S)  8.6-50 MG tablet Take 2 tablets by mouth daily. (Patient not taking: Reported on 12/28/2015) 30 tablet 1   No facility-administered medications prior to visit.    ROS Review of Systems  Constitutional: Positive for fatigue. Negative for chills, activity change, appetite change and unexpected weight change.  HENT: Negative for congestion, mouth sores and sinus pressure.   Eyes: Negative for visual disturbance.  Respiratory: Negative for cough and chest tightness.   Gastrointestinal: Negative for nausea and abdominal pain.  Genitourinary: Negative for frequency, decreased urine volume, difficulty urinating and vaginal pain.  Musculoskeletal: Positive for back pain, arthralgias and gait problem. Negative for neck pain.  Skin: Negative for pallor and rash.  Neurological: Positive for weakness. Negative for dizziness, tremors, numbness and headaches.  Psychiatric/Behavioral: Positive for dysphoric mood and decreased concentration. Negative for confusion and sleep disturbance. The patient is nervous/anxious.     Objective:  BP 140/70 mmHg  Pulse 98  Wt 134 lb (60.782 kg)  SpO2 93%  BP Readings from Last 3 Encounters:  12/28/15 140/70  03/11/15 130/74  01/21/15 118/70    Wt Readings from Last 3 Encounters:  12/28/15 134 lb (60.782 kg)  03/11/15 129 lb (58.514 kg)  12/08/14 130 lb (58.968 kg)    Physical Exam  Constitutional: She appears well-developed.  No distress.  HENT:  Head: Normocephalic.  Right Ear: External ear normal.  Left Ear: External ear normal.  Nose: Nose normal.  Mouth/Throat: Oropharynx is clear and moist.  Eyes: Conjunctivae are normal. Pupils are equal, round, and reactive to light. Right eye exhibits no discharge. Left eye exhibits no discharge.  Neck: Normal range of motion. Neck supple. No JVD present. No tracheal deviation present. No thyromegaly present.  Cardiovascular: Normal rate, regular rhythm and normal heart sounds.   Pulmonary/Chest: No stridor.  No respiratory distress. She has no wheezes.  Abdominal: Soft. Bowel sounds are normal. She exhibits no distension and no mass. There is no tenderness. There is no rebound and no guarding.  Musculoskeletal: She exhibits tenderness. She exhibits no edema.  Lymphadenopathy:    She has no cervical adenopathy.  Neurological: She displays abnormal reflex. A cranial nerve deficit is present. She exhibits abnormal muscle tone. Coordination abnormal.  Skin: No rash noted. No erythema.  Psychiatric: She has a normal mood and affect. Her behavior is normal. Judgment and thought content normal.  weak LE's In a w/c  Lab Results  Component Value Date   WBC 12.4* 12/28/2015   HGB 14.8 12/28/2015   HCT 44.0 12/28/2015   PLT 241.0 12/28/2015   GLUCOSE 280* 12/28/2015   CHOL 203* 03/11/2015   TRIG 88.0 03/11/2015   HDL 72.20 03/11/2015   LDLDIRECT 100.8 10/26/2013   LDLCALC 113* 03/11/2015   ALT 2 03/11/2015   AST 12 03/11/2015   NA 134* 12/28/2015   K 4.6 12/28/2015   CL 97 12/28/2015   CREATININE 0.62 12/28/2015   BUN 15 12/28/2015   CO2 30 12/28/2015   TSH 1.43 12/28/2015   INR 1.0 ratio 10/21/2009   HGBA1C 8.8* 12/28/2015    Dg Chest 2 View  06/24/2014  CLINICAL DATA:  Preoperative for ORIF of a proximal humeral fracture EXAM: CHEST  2 VIEW COMPARISON:  PA and lateral chest x-ray dated January 12, 2011. FINDINGS: The lungs are well-expanded. There is no focal infiltrate. There is no pleural effusion or pneumothorax. The heart and pulmonary vascularity are within the limits of normal. There are surgical clips in the left upper quadrant of the abdomen. The ribs and thoracic spine are unremarkable. IMPRESSION: There is no acute cardiopulmonary abnormality. Mild stable hyperinflation is consistent with known COPD. Electronically Signed   By: David  [place]   On: 06/24/2014 09:43   Dg Humerus Left  06/24/2014  CLINICAL DATA:  Post ORIF LEFT humerus EXAM: LEFT HUMERUS - 2+ VIEW COMPARISON:   Portable exam 1532 hr compared to intraoperative exam of 06/24/2014 FINDINGS: Plate and multiple screws present at the proximal LEFT humerus post ORIF of a comminuted mildly displaced proximal humeral diaphyseal fracture. Distal humerus appears intact. Elbow and shoulder joint alignments appear grossly normal. IMPRESSION: Post ORIF of the proximal LEFT humerus. Electronically Signed   By: Ulyses SouthwardMark  Boles M.D.   On: 06/24/2014 15:49   Dg Humerus Left  06/24/2014  CLINICAL DATA:  Fixation left humerus fracture. EXAM: DG C-ARM 61-120 MIN; LEFT HUMERUS - 2+ VIEW COMPARISON:  Plain films left humerus 06/21/2014. FINDINGS: We are provided with 5 fluoroscopic intraoperative spot views of the left humerus. Images demonstrate placement of plate and screws for fixation of a proximal fracture. Position and alignment are markedly improved. Hardware is intact. No acute abnormality is identified. IMPRESSION: ORIF left humerus fracture without evidence of complication. Electronically Signed   By: Drusilla Kannerhomas  Dalessio M.D.   On: 06/24/2014 14:59  Dg C-arm 61-120 Min  06/24/2014  CLINICAL DATA:  Fixation left humerus fracture. EXAM: DG C-ARM 61-120 MIN; LEFT HUMERUS - 2+ VIEW COMPARISON:  Plain films left humerus 06/21/2014. FINDINGS: We are provided with 5 fluoroscopic intraoperative spot views of the left humerus. Images demonstrate placement of plate and screws for fixation of a proximal fracture. Position and alignment are markedly improved. Hardware is intact. No acute abnormality is identified. IMPRESSION: ORIF left humerus fracture without evidence of complication. Electronically Signed   By: Drusilla Kanner M.D.   On: 06/24/2014 14:59    Assessment & Plan:   Diagnoses and all orders for this visit:  Gait disorder -     CBC with Differential/Platelet; Future -     Basic metabolic panel; Future -     Hemoglobin A1c; Future -     Vitamin B12; Future -     VITAMIN D 25 Hydroxy (Vit-D Deficiency, Fractures); Future -      TSH; Future  Type 2 diabetes mellitus with hyperosmolarity without coma, without long-term current use of insulin (HCC) -     CBC with Differential/Platelet; Future -     Basic metabolic panel; Future -     Hemoglobin A1c; Future -     Vitamin B12; Future -     VITAMIN D 25 Hydroxy (Vit-D Deficiency, Fractures); Future -     TSH; Future  B12 deficiency -     CBC with Differential/Platelet; Future -     Basic metabolic panel; Future -     Hemoglobin A1c; Future -     Vitamin B12; Future -     VITAMIN D 25 Hydroxy (Vit-D Deficiency, Fractures); Future -     TSH; Future  Vitamin D deficiency -     CBC with Differential/Platelet; Future -     Basic metabolic panel; Future -     Hemoglobin A1c; Future -     Vitamin B12; Future -     VITAMIN D 25 Hydroxy (Vit-D Deficiency, Fractures); Future -     TSH; Future  Other orders -     Ambulatory referral to Home Health  I am having Ms. Twilley maintain her aspirin, l-methylfolate-B6-B12, morphine, tolterodine, sennosides-docusate sodium, HYDROmorphone, metFORMIN, mirtazapine, cholecalciferol, ergocalciferol, zoster vaccine live (PF), carbidopa-levodopa, LORazepam, promethazine, and zolpidem.  No orders of the defined types were placed in this encounter.     Follow-up: Return in about 3 months (around 03/29/2016) for a follow-up visit.  [name], MD

## 2015-12-28 NOTE — Assessment & Plan Note (Signed)
On B12 

## 2015-12-29 ENCOUNTER — Encounter: Payer: Self-pay | Admitting: Internal Medicine

## 2015-12-29 DIAGNOSIS — Z8669 Personal history of other diseases of the nervous system and sense organs: Secondary | ICD-10-CM | POA: Insufficient documentation

## 2015-12-29 DIAGNOSIS — M47816 Spondylosis without myelopathy or radiculopathy, lumbar region: Secondary | ICD-10-CM | POA: Diagnosis not present

## 2015-12-29 DIAGNOSIS — G931 Anoxic brain damage, not elsewhere classified: Secondary | ICD-10-CM | POA: Insufficient documentation

## 2015-12-29 DIAGNOSIS — G894 Chronic pain syndrome: Secondary | ICD-10-CM | POA: Diagnosis not present

## 2015-12-29 DIAGNOSIS — Z79891 Long term (current) use of opiate analgesic: Secondary | ICD-10-CM | POA: Diagnosis not present

## 2015-12-29 NOTE — Assessment & Plan Note (Signed)
Remote - w/c bound

## 2016-01-03 DIAGNOSIS — R296 Repeated falls: Secondary | ICD-10-CM | POA: Diagnosis not present

## 2016-01-03 DIAGNOSIS — R2689 Other abnormalities of gait and mobility: Secondary | ICD-10-CM | POA: Diagnosis not present

## 2016-01-04 ENCOUNTER — Other Ambulatory Visit: Payer: Self-pay | Admitting: Internal Medicine

## 2016-01-04 MED ORDER — EMPAGLIFLOZIN 10 MG PO TABS: 10.0000 mg | ORAL_TABLET | Freq: Every day | ORAL | Status: DC

## 2016-01-04 MED ORDER — ERGOCALCIFEROL 1.25 MG (50000 UT) PO CAPS: 50000.0000 [IU] | ORAL_CAPSULE | ORAL | Status: DC

## 2016-01-05 ENCOUNTER — Ambulatory Visit (INDEPENDENT_AMBULATORY_CARE_PROVIDER_SITE_OTHER): Payer: Medicare Other | Admitting: Ophthalmology

## 2016-01-05 DIAGNOSIS — H43813 Vitreous degeneration, bilateral: Secondary | ICD-10-CM

## 2016-01-05 DIAGNOSIS — H353132 Nonexudative age-related macular degeneration, bilateral, intermediate dry stage: Secondary | ICD-10-CM

## 2016-01-06 DIAGNOSIS — R296 Repeated falls: Secondary | ICD-10-CM | POA: Diagnosis not present

## 2016-01-06 DIAGNOSIS — R2689 Other abnormalities of gait and mobility: Secondary | ICD-10-CM | POA: Diagnosis not present

## 2016-01-10 DIAGNOSIS — R296 Repeated falls: Secondary | ICD-10-CM | POA: Diagnosis not present

## 2016-01-10 DIAGNOSIS — R2689 Other abnormalities of gait and mobility: Secondary | ICD-10-CM | POA: Diagnosis not present

## 2016-01-11 DIAGNOSIS — R2689 Other abnormalities of gait and mobility: Secondary | ICD-10-CM | POA: Diagnosis not present

## 2016-01-12 ENCOUNTER — Telehealth: Payer: Self-pay

## 2016-01-12 DIAGNOSIS — I739 Peripheral vascular disease, unspecified: Secondary | ICD-10-CM | POA: Diagnosis not present

## 2016-01-12 DIAGNOSIS — E1151 Type 2 diabetes mellitus with diabetic peripheral angiopathy without gangrene: Secondary | ICD-10-CM | POA: Diagnosis not present

## 2016-01-12 DIAGNOSIS — L603 Nail dystrophy: Secondary | ICD-10-CM | POA: Diagnosis not present

## 2016-01-12 NOTE — Telephone Encounter (Signed)
Home Health Cert/Plan of Care received (01/03/2016 - 03/02/2016) signed.  Faxed, copy sent to scan

## 2016-01-13 DIAGNOSIS — R2689 Other abnormalities of gait and mobility: Secondary | ICD-10-CM | POA: Diagnosis not present

## 2016-01-13 DIAGNOSIS — R296 Repeated falls: Secondary | ICD-10-CM | POA: Diagnosis not present

## 2016-01-16 ENCOUNTER — Other Ambulatory Visit: Payer: Self-pay | Admitting: Internal Medicine

## 2016-01-17 DIAGNOSIS — R2689 Other abnormalities of gait and mobility: Secondary | ICD-10-CM | POA: Diagnosis not present

## 2016-01-17 DIAGNOSIS — R296 Repeated falls: Secondary | ICD-10-CM | POA: Diagnosis not present

## 2016-01-19 DIAGNOSIS — R2689 Other abnormalities of gait and mobility: Secondary | ICD-10-CM | POA: Diagnosis not present

## 2016-01-19 DIAGNOSIS — R296 Repeated falls: Secondary | ICD-10-CM | POA: Diagnosis not present

## 2016-01-23 DIAGNOSIS — R296 Repeated falls: Secondary | ICD-10-CM | POA: Diagnosis not present

## 2016-01-23 DIAGNOSIS — R2689 Other abnormalities of gait and mobility: Secondary | ICD-10-CM | POA: Diagnosis not present

## 2016-01-24 ENCOUNTER — Other Ambulatory Visit: Payer: Self-pay | Admitting: Internal Medicine

## 2016-01-25 DIAGNOSIS — M47816 Spondylosis without myelopathy or radiculopathy, lumbar region: Secondary | ICD-10-CM | POA: Diagnosis not present

## 2016-01-25 DIAGNOSIS — R296 Repeated falls: Secondary | ICD-10-CM | POA: Diagnosis not present

## 2016-01-25 DIAGNOSIS — G894 Chronic pain syndrome: Secondary | ICD-10-CM | POA: Diagnosis not present

## 2016-01-25 DIAGNOSIS — R2689 Other abnormalities of gait and mobility: Secondary | ICD-10-CM | POA: Diagnosis not present

## 2016-01-25 DIAGNOSIS — Z79891 Long term (current) use of opiate analgesic: Secondary | ICD-10-CM | POA: Diagnosis not present

## 2016-01-31 ENCOUNTER — Telehealth: Payer: Self-pay | Admitting: Internal Medicine

## 2016-01-31 DIAGNOSIS — R2689 Other abnormalities of gait and mobility: Secondary | ICD-10-CM | POA: Diagnosis not present

## 2016-01-31 DIAGNOSIS — R296 Repeated falls: Secondary | ICD-10-CM | POA: Diagnosis not present

## 2016-01-31 NOTE — Telephone Encounter (Signed)
Verbal okay given for the below.  

## 2016-01-31 NOTE — Telephone Encounter (Signed)
Requesting order to extend PT for 3 weeks 1 times a week for three weeks starting week of 5/14

## 2016-02-08 DIAGNOSIS — R296 Repeated falls: Secondary | ICD-10-CM | POA: Diagnosis not present

## 2016-02-08 DIAGNOSIS — R2689 Other abnormalities of gait and mobility: Secondary | ICD-10-CM | POA: Diagnosis not present

## 2016-02-15 DIAGNOSIS — R2689 Other abnormalities of gait and mobility: Secondary | ICD-10-CM | POA: Diagnosis not present

## 2016-02-15 DIAGNOSIS — R296 Repeated falls: Secondary | ICD-10-CM | POA: Diagnosis not present

## 2016-02-22 DIAGNOSIS — R296 Repeated falls: Secondary | ICD-10-CM | POA: Diagnosis not present

## 2016-02-22 DIAGNOSIS — M47816 Spondylosis without myelopathy or radiculopathy, lumbar region: Secondary | ICD-10-CM | POA: Diagnosis not present

## 2016-02-22 DIAGNOSIS — Z79891 Long term (current) use of opiate analgesic: Secondary | ICD-10-CM | POA: Diagnosis not present

## 2016-02-22 DIAGNOSIS — G894 Chronic pain syndrome: Secondary | ICD-10-CM | POA: Diagnosis not present

## 2016-02-22 DIAGNOSIS — R2689 Other abnormalities of gait and mobility: Secondary | ICD-10-CM | POA: Diagnosis not present

## 2016-02-29 DIAGNOSIS — R296 Repeated falls: Secondary | ICD-10-CM | POA: Diagnosis not present

## 2016-02-29 DIAGNOSIS — R2689 Other abnormalities of gait and mobility: Secondary | ICD-10-CM | POA: Diagnosis not present

## 2016-03-02 ENCOUNTER — Other Ambulatory Visit: Payer: Self-pay | Admitting: Internal Medicine

## 2016-03-05 NOTE — Telephone Encounter (Signed)
rx phoned in

## 2016-03-06 NOTE — Telephone Encounter (Signed)
Phoned in lorazepam

## 2016-03-21 DIAGNOSIS — Z79891 Long term (current) use of opiate analgesic: Secondary | ICD-10-CM | POA: Diagnosis not present

## 2016-03-21 DIAGNOSIS — G894 Chronic pain syndrome: Secondary | ICD-10-CM | POA: Diagnosis not present

## 2016-03-21 DIAGNOSIS — M47816 Spondylosis without myelopathy or radiculopathy, lumbar region: Secondary | ICD-10-CM | POA: Diagnosis not present

## 2016-03-28 ENCOUNTER — Ambulatory Visit (INDEPENDENT_AMBULATORY_CARE_PROVIDER_SITE_OTHER): Payer: Medicare Other | Admitting: Internal Medicine

## 2016-03-28 ENCOUNTER — Encounter: Payer: Self-pay | Admitting: Internal Medicine

## 2016-03-28 VITALS — BP 130/78 | HR 88 | Wt 134.0 lb

## 2016-03-28 DIAGNOSIS — R269 Unspecified abnormalities of gait and mobility: Secondary | ICD-10-CM | POA: Diagnosis not present

## 2016-03-28 DIAGNOSIS — E559 Vitamin D deficiency, unspecified: Secondary | ICD-10-CM

## 2016-03-28 DIAGNOSIS — F17200 Nicotine dependence, unspecified, uncomplicated: Secondary | ICD-10-CM

## 2016-03-28 DIAGNOSIS — E1100 Type 2 diabetes mellitus with hyperosmolarity without nonketotic hyperglycemic-hyperosmolar coma (NKHHC): Secondary | ICD-10-CM | POA: Diagnosis not present

## 2016-03-28 DIAGNOSIS — M25512 Pain in left shoulder: Secondary | ICD-10-CM

## 2016-03-28 DIAGNOSIS — E538 Deficiency of other specified B group vitamins: Secondary | ICD-10-CM | POA: Diagnosis not present

## 2016-03-28 DIAGNOSIS — F411 Generalized anxiety disorder: Secondary | ICD-10-CM

## 2016-03-28 MED ORDER — VITAMIN D (ERGOCALCIFEROL) 1.25 MG (50000 UNIT) PO CAPS: ORAL_CAPSULE | ORAL | Status: DC

## 2016-03-28 NOTE — Assessment & Plan Note (Signed)
On Dilaud[facility] - Dr Vear ClockPhillips

## 2016-03-28 NOTE — Progress Notes (Signed)
Subjective:  Patient ID: [name], female    DOB: 04/01/1951  Age: 65 y.o. MRN: [id number]  CC: No chief complaint on file.   HPI [name] presents for wt loss, chronic pain, DM2, - worse, Vit D def f/u. [name] never started Vit D or Jardiance. CBGs are 100-120 per pt  Outpatient Prescriptions Prior to Visit  Medication Sig Dispense Refill  . aspirin 81 MG EC tablet Take 81 mg by mouth daily.      . carbidopa-levodopa (SINEMET IR) 25-250 MG tablet Take 1 tablet by mouth 3 (three) times daily. 270 tablet 3  . cholecalciferol (VITAMIN D) 1000 UNITS tablet Take 2 tablets (2,000 Units total) by mouth daily. 100 tablet 3  . empagliflozin (JARDIANCE) 10 MG TABS tablet Take 10 mg by mouth daily. 30 tablet 11  . HYDROmorphone (DILAUDID) 2 MG tablet Take 2-3 tablets (4-6 mg total) by mouth every 6 (six) hours as needed for moderate pain or severe pain. 75 tablet 0  . l-methylfolate-B6-B12 (METANX) 3-35-2 MG TABS Take 1 tablet by mouth 2 (two) times daily. 180 tablet 3  . LORazepam (ATIVAN) 2 MG tablet TAKE ONE-HALF TO ONE TABLET BY MOUTH 3 TIMES A DAY 90 tablet 3  . metFORMIN (GLUCOPHAGE-XR) 500 MG 24 hr tablet TAKE 1 TABLET THREE TIMES A DAY 270 tablet 1  . morphine (MS CONTIN) 15 MG 12 hr tablet Take 15 mg by mouth every 12 (twelve) hours. Reported on 12/28/2015    . promethazine (PHENERGAN) 12.5 MG tablet Take 1 tablet (12.5 mg total) by mouth every 6 (six) hours as needed. for nausea 60 tablet 1  . promethazine (PHENERGAN) 12.5 MG tablet TAKE 1 TABLET BY MOUTH EVERY 6 HOURS AS NEEDED FOR NAUSEA 60 tablet 3  . sennosides-docusate sodium (SENOKOT-S) 8.6-50 MG tablet Take 2 tablets by mouth daily. 30 tablet 1  . tolterodine (DETROL LA) 4 MG 24 hr capsule Take 4 mg by mouth at bedtime.    . tolterodine (DETROL LA) 4 MG 24 hr capsule TAKE ONE CAPSULE AT BEDTIME 90 capsule 3  . Vitamin D, Ergocalciferol, (DRISDOL) 50000 units CAPS capsule TAKE 1 CAPSULE (50,000 UNITS TOTAL) BY MOUTH ONCE A  WEEK. 6 capsule 0  . zolpidem (AMBIEN) 10 MG tablet Take 1 tablet (10 mg total) by mouth at bedtime as needed. for sleep 30 tablet 5  . zoster vaccine live, PF, (ZOSTAVAX) [id number] UNT/0.65ML injection Inject 19,400 Units into the skin once. 1 each 0  . mirtazapine (REMERON) 15 MG tablet Take 1 tablet (15 mg total) by mouth at bedtime. Take at 6-8 pm 90 tablet 3   No facility-administered medications prior to visit.    ROS Review of Systems  Constitutional: Positive for fatigue. Negative for chills, activity change, appetite change and unexpected weight change.  HENT: Negative for congestion, mouth sores and sinus pressure.   Eyes: Negative for visual disturbance.  Respiratory: Negative for cough and chest tightness.   Gastrointestinal: Negative for nausea and abdominal pain.  Genitourinary: Negative for frequency, difficulty urinating and vaginal pain.  Musculoskeletal: Positive for back pain, arthralgias, gait problem, neck pain and neck stiffness. Negative for myalgias.  Skin: Negative for pallor and rash.  Neurological: Negative for dizziness, tremors, weakness, numbness and headaches.  Psychiatric/Behavioral: Negative for confusion and sleep disturbance.    Objective:  BP 130/78 mmHg  Pulse 88  Wt 134 lb (60.782 kg)  SpO2 92%  BP Readings from Last 3 Encounters:  03/28/16 130/78  12/28/15  140/70  03/11/15 130/74    Wt Readings from Last 3 Encounters:  03/28/16 134 lb (60.782 kg)  12/28/15 134 lb (60.782 kg)  03/11/15 129 lb (58.514 kg)    Physical Exam  Constitutional: She appears well-developed. No distress.  HENT:  Head: Normocephalic.  Right Ear: External ear normal.  Left Ear: External ear normal.  Nose: Nose normal.  Mouth/Throat: Oropharynx is clear and moist.  Eyes: Conjunctivae are normal. Pupils are equal, round, and reactive to light. Right eye exhibits no discharge. Left eye exhibits no discharge.  Neck: Normal range of motion. Neck supple. No JVD  present. No tracheal deviation present. No thyromegaly present.  Cardiovascular: Normal rate, regular rhythm and normal heart sounds.   Pulmonary/Chest: No stridor. No respiratory distress. She has no wheezes.  Abdominal: Soft. Bowel sounds are normal. She exhibits no distension and no mass. There is no tenderness. There is no rebound and no guarding.  Musculoskeletal: She exhibits tenderness. She exhibits no edema.  Lymphadenopathy:    She has no cervical adenopathy.  Neurological: She displays normal reflexes. No cranial nerve deficit. She exhibits normal muscle tone. Coordination abnormal.  Skin: No rash noted. No erythema.  Psychiatric: Her behavior is normal. Judgment and thought content normal.  in a w/c  Lab Results  Component Value Date   WBC 12.4* 12/28/2015   HGB 14.8 12/28/2015   HCT 44.0 12/28/2015   PLT 241.0 12/28/2015   GLUCOSE 280* 12/28/2015   CHOL 203* 03/11/2015   TRIG 88.0 03/11/2015   HDL 72.20 03/11/2015   LDLDIRECT 100.8 10/26/2013   LDLCALC 113* 03/11/2015   ALT 2 03/11/2015   AST 12 03/11/2015   NA 134* 12/28/2015   K 4.6 12/28/2015   CL 97 12/28/2015   CREATININE 0.62 12/28/2015   BUN 15 12/28/2015   CO2 30 12/28/2015   TSH 1.43 12/28/2015   INR 1.0 ratio 10/21/2009   HGBA1C 8.8* 12/28/2015    Dg Chest 2 View  06/24/2014  CLINICAL DATA:  Preoperative for ORIF of a proximal humeral fracture EXAM: CHEST  2 VIEW COMPARISON:  PA and lateral chest x-ray dated January 12, 2011. FINDINGS: The lungs are well-expanded. There is no focal infiltrate. There is no pleural effusion or pneumothorax. The heart and pulmonary vascularity are within the limits of normal. There are surgical clips in the left upper quadrant of the abdomen. The ribs and thoracic spine are unremarkable. IMPRESSION: There is no acute cardiopulmonary abnormality. Mild stable hyperinflation is consistent with known COPD. Electronically Signed   By: David  [place]   On: 06/24/2014 09:43   Dg  Humerus Left  06/24/2014  CLINICAL DATA:  Post ORIF LEFT humerus EXAM: LEFT HUMERUS - 2+ VIEW COMPARISON:  Portable exam 1532 hr compared to intraoperative exam of 06/24/2014 FINDINGS: Plate and multiple screws present at the proximal LEFT humerus post ORIF of a comminuted mildly displaced proximal humeral diaphyseal fracture. Distal humerus appears intact. Elbow and shoulder joint alignments appear grossly normal. IMPRESSION: Post ORIF of the proximal LEFT humerus. Electronically Signed   By: Ulyses SouthwardMark  Boles M.D.   On: 06/24/2014 15:49   Dg Humerus Left  06/24/2014  CLINICAL DATA:  Fixation left humerus fracture. EXAM: DG C-ARM 61-120 MIN; LEFT HUMERUS - 2+ VIEW COMPARISON:  Plain films left humerus 06/21/2014. FINDINGS: We are provided with 5 fluoroscopic intraoperative spot views of the left humerus. Images demonstrate placement of plate and screws for fixation of a proximal fracture. Position and alignment are markedly improved. Hardware is  intact. No acute abnormality is identified. IMPRESSION: ORIF left humerus fracture without evidence of complication. Electronically Signed   By: Drusilla Kanner M.D.   On: 06/24/2014 14:59   Dg C-arm 61-120 Min  06/24/2014  CLINICAL DATA:  Fixation left humerus fracture. EXAM: DG C-ARM 61-120 MIN; LEFT HUMERUS - 2+ VIEW COMPARISON:  Plain films left humerus 06/21/2014. FINDINGS: We are provided with 5 fluoroscopic intraoperative spot views of the left humerus. Images demonstrate placement of plate and screws for fixation of a proximal fracture. Position and alignment are markedly improved. Hardware is intact. No acute abnormality is identified. IMPRESSION: ORIF left humerus fracture without evidence of complication. Electronically Signed   By: Drusilla Kanner M.D.   On: 06/24/2014 14:59    Assessment & Plan:   There are no diagnoses linked to this encounter. I am having Ms. Heffern maintain her aspirin, l-methylfolate-B6-B12, morphine, tolterodine, sennosides-docusate  sodium, HYDROmorphone, metFORMIN, mirtazapine, cholecalciferol, zoster vaccine live (PF), carbidopa-levodopa, promethazine, zolpidem, empagliflozin, promethazine, tolterodine, Vitamin D (Ergocalciferol), and LORazepam.  No orders of the defined types were placed in this encounter.     Follow-up: No Follow-up on file.  [name], MD

## 2016-03-28 NOTE — Assessment & Plan Note (Signed)
Chronic - post-anoxic brain injury In a w/c; can use a walker

## 2016-03-28 NOTE — Assessment & Plan Note (Signed)
Better: 1/2 ppd now Discussed

## 2016-03-28 NOTE — Assessment & Plan Note (Signed)
W/c and walker PT if worse

## 2016-03-28 NOTE — Assessment & Plan Note (Addendum)
On Metformin DM2 - worse, [name] never started [place]. CBGs are good now: 100-120 per pt

## 2016-03-28 NOTE — Assessment & Plan Note (Signed)
Worse Re start Vit D prescription 50000 iu weekly (Rx emailed to your pharmacy) followed by over-the-counter Vit D 2000 iu daily.

## 2016-03-28 NOTE — Assessment & Plan Note (Signed)
Chronic  Potential benefits of a long term benzodiazepines  use as well as potential risks  and complications were explained to the patient and were aknowledged. 

## 2016-03-28 NOTE — Assessment & Plan Note (Signed)
On B12 

## 2016-04-02 DIAGNOSIS — I739 Peripheral vascular disease, unspecified: Secondary | ICD-10-CM | POA: Diagnosis not present

## 2016-04-02 DIAGNOSIS — E1151 Type 2 diabetes mellitus with diabetic peripheral angiopathy without gangrene: Secondary | ICD-10-CM | POA: Diagnosis not present

## 2016-04-02 DIAGNOSIS — L603 Nail dystrophy: Secondary | ICD-10-CM | POA: Diagnosis not present

## 2016-04-19 DIAGNOSIS — Z79891 Long term (current) use of opiate analgesic: Secondary | ICD-10-CM | POA: Diagnosis not present

## 2016-04-19 DIAGNOSIS — G894 Chronic pain syndrome: Secondary | ICD-10-CM | POA: Diagnosis not present

## 2016-04-19 DIAGNOSIS — M47816 Spondylosis without myelopathy or radiculopathy, lumbar region: Secondary | ICD-10-CM | POA: Diagnosis not present

## 2016-04-29 ENCOUNTER — Other Ambulatory Visit: Payer: Self-pay | Admitting: Internal Medicine

## 2016-04-30 ENCOUNTER — Other Ambulatory Visit: Payer: Self-pay | Admitting: Internal Medicine

## 2016-05-02 NOTE — Telephone Encounter (Signed)
Called refill into [facility] spoke w/clayton gave md approval.../lmb

## 2016-05-07 ENCOUNTER — Other Ambulatory Visit: Payer: Self-pay | Admitting: Internal Medicine

## 2016-05-07 ENCOUNTER — Ambulatory Visit: Payer: Medicare Other

## 2016-05-07 VITALS — Ht 65.0 in | Wt 135.5 lb

## 2016-05-07 DIAGNOSIS — Z0000 Encounter for general adult medical examination without abnormal findings: Secondary | ICD-10-CM

## 2016-05-07 DIAGNOSIS — Z7289 Other problems related to lifestyle: Secondary | ICD-10-CM

## 2016-05-07 DIAGNOSIS — Z780 Asymptomatic menopausal state: Secondary | ICD-10-CM

## 2016-05-07 NOTE — Patient Instructions (Addendum)
[name] , Thank you for taking time to come for your Medicare Wellness Visit. I appreciate your ongoing commitment to your health goals. Please review the following plan we discussed and let me know if I can assist you in the future.   Will take hep c at next blood draw Will also have ur Albumin drawn at next lab  Mammogram not sure when; Breast center   Will have dexa here at [facility] as convenient  Educated to check with insurance regarding coverage of Shingles vaccination on Part D or Part B and may have lower co-pay if provided on the Part D side  Will bring a copy of Advanced Directive HCPOA when convenient   Vit D 50000 units are 12.00 for all 6; Please check with insurance for Rx;   Spouse picked up meds;  Refill remaining on ambien and remeron     These are the goals we discussed: Goals    . Exercise 150 minutes per week (moderate activity)          Uses walker at home;  Does step exercise with the walker;  Legs hurt when she exercises        This is a list of the screening recommended for you and due dates:  Health Maintenance  Topic Date Due  .  Hepatitis C: One time screening is recommended by Center for Disease Control  (CDC) for  adults born from [birth year] through 1966.   03-07-1951  . Urine Protein Check  08/30/1960  . HIV Screening  08/30/1965  . Pap Smear  08/31/1971  . Shingles Vaccine  08/30/2010  . Complete foot exam   10/21/2010  . DEXA scan (bone density measurement)  08/31/2015  . Eye exam for diabetics  12/22/2015  . Flu Shot  05/01/2016  . Hemoglobin A1C  06/29/2016  . Mammogram  04/22/2017  . Pneumonia vaccines (2 of 2 - PPSV23) 10/26/2018  . Colon Cancer Screening  10/01/2022  . Tetanus Vaccine  11/01/2024       Fall Prevention in the Home  Falls can cause injuries. They can happen to people of all ages. There are many things you can do to make your home safe and to help prevent falls.  WHAT CAN I DO ON THE OUTSIDE OF MY HOME?  Regularly  fix the edges of walkways and driveways and fix any cracks.  Remove anything that might make you trip as you walk through a door, such as a raised step or threshold.  Trim any bushes or trees on the path to your home.  Use bright outdoor lighting.  Clear any walking paths of anything that might make someone trip, such as rocks or tools.  Regularly check to see if handrails are loose or broken. Make sure that both sides of any steps have handrails.  Any raised decks and porches should have guardrails on the edges.  Have any leaves, snow, or ice cleared regularly.  Use sand or salt on walking paths during winter.  Clean up any spills in your garage right away. This includes oil or grease spills. WHAT CAN I DO IN THE BATHROOM?   Use night lights.  Install grab bars by the toilet and in the tub and shower. Do not use towel bars as grab bars.  Use non-skid mats or decals in the tub or shower.  If you need to sit down in the shower, use a plastic, non-slip stool.  Keep the floor dry. Clean up any water that  spills on the floor as soon as it happens.  Remove soap buildup in the tub or shower regularly.  Attach bath mats securely with double-sided non-slip rug tape.  Do not have throw rugs and other things on the floor that can make you trip. WHAT CAN I DO IN THE BEDROOM?  Use night lights.  Make sure that you have a light by your bed that is easy to reach.  Do not use any sheets or blankets that are too big for your bed. They should not hang down onto the floor.  Have a firm chair that has side arms. You can use this for support while you get dressed.  Do not have throw rugs and other things on the floor that can make you trip. WHAT CAN I DO IN THE KITCHEN?  Clean up any spills right away.  Avoid walking on wet floors.  Keep items that you use a lot in easy-to-reach places.  If you need to reach something above you, use a strong step stool that has a grab bar.  Keep  electrical cords out of the way.  Do not use floor polish or wax that makes floors slippery. If you must use wax, use non-skid floor wax.  Do not have throw rugs and other things on the floor that can make you trip. WHAT CAN I DO WITH MY STAIRS?  Do not leave any items on the stairs.  Make sure that there are handrails on both sides of the stairs and use them. Fix handrails that are broken or loose. Make sure that handrails are as long as the stairways.  Check any carpeting to make sure that it is firmly attached to the stairs. Fix any carpet that is loose or worn.  Avoid having throw rugs at the top or bottom of the stairs. If you do have throw rugs, attach them to the floor with carpet tape.  Make sure that you have a light switch at the top of the stairs and the bottom of the stairs. If you do not have them, ask someone to add them for you. WHAT ELSE CAN I DO TO HELP PREVENT FALLS?  Wear shoes that:  Do not have high heels.  Have rubber bottoms.  Are comfortable and fit you well.  Are closed at the toe. Do not wear sandals.  If you use a stepladder:  Make sure that it is fully opened. Do not climb a closed stepladder.  Make sure that both sides of the stepladder are locked into place.  Ask someone to hold it for you, if possible.  Clearly mark and make sure that you can see:  Any grab bars or handrails.  First and last steps.  Where the edge of each step is.  Use tools that help you move around (mobility aids) if they are needed. These include:  Canes.  Walkers.  Scooters.  Crutches.  Turn on the lights when you go into a dark area. Replace any light bulbs as soon as they burn out.  Set up your furniture so you have a clear path. Avoid moving your furniture around.  If any of your floors are uneven, fix them.  If there are any pets around you, be aware of where they are.  Review your medicines with your doctor. Some medicines can make you feel dizzy.  This can increase your chance of falling. Ask your doctor what other things that you can do to help prevent falls.   This information  is not intended to replace advice given to you by your health care provider. Make sure you discuss any questions you have with your health care provider.   Document Released: 07/14/2009 Document Revised: 02/01/2015 Document Reviewed: 10/22/2014 Elsevier Interactive Patient Education 2016 [place] Maintenance, Female Adopting a healthy lifestyle and getting preventive care can go a long way to promote health and wellness. Talk with your health care provider about what schedule of regular examinations is right for you. This is a good chance for you to check in with your provider about disease prevention and staying healthy. In between checkups, there are plenty of things you can do on your own. Experts have done a lot of research about which lifestyle changes and preventive measures are most likely to keep you healthy. Ask your health care provider for more information. WEIGHT AND DIET  Eat a healthy diet  Be sure to include plenty of vegetables, fruits, low-fat dairy products, and lean protein.  Do not eat a lot of foods high in solid fats, added sugars, or salt.  Get regular exercise. This is one of the most important things you can do for your health.  Most adults should exercise for at least 150 minutes each week. The exercise should increase your heart rate and make you sweat (moderate-intensity exercise).  Most adults should also do strengthening exercises at least twice a week. This is in addition to the moderate-intensity exercise.  Maintain a healthy weight  Body mass index (BMI) is a measurement that can be used to identify possible weight problems. It estimates body fat based on height and weight. Your health care provider can help determine your BMI and help you achieve or maintain a healthy weight.  For females 44 years of age and  older:   A BMI below 18.5 is considered underweight.  A BMI of 18.5 to 24.9 is normal.  A BMI of 25 to 29.9 is considered overweight.  A BMI of 30 and above is considered obese.  Watch levels of cholesterol and blood lipids  You should start having your blood tested for lipids and cholesterol at 66 years of age, then have this test every 5 years.  You may need to have your cholesterol levels checked more often if:  Your lipid or cholesterol levels are high.  You are older than 66 years of age.  You are at high risk for heart disease.  CANCER SCREENING   Lung Cancer  Lung cancer screening is recommended for adults 11-15 years old who are at high risk for lung cancer because of a history of smoking.  A yearly low-dose CT scan of the lungs is recommended for people who:  Currently smoke.  Have quit within the past 15 years.  Have at least a 30-pack-year history of smoking. A pack year is smoking an average of one pack of cigarettes a day for 1 year.  Yearly screening should continue until it has been 15 years since you quit.  Yearly screening should stop if you develop a health problem that would prevent you from having lung cancer treatment.  Breast Cancer  Practice breast self-awareness. This means understanding how your breasts normally appear and feel.  It also means doing regular breast self-exams. Let your health care provider know about any changes, no matter how small.  If you are in your 20s or 30s, you should have a clinical breast exam (CBE) by a health care provider every 1-3 years as part of a  regular health exam.  If you are 40 or older, have a CBE every year. Also consider having a breast X-ray (mammogram) every year.  If you have a family history of breast cancer, talk to your health care provider about genetic screening.  If you are at high risk for breast cancer, talk to your health care provider about having an MRI and a mammogram every  year.  Breast cancer gene (BRCA) assessment is recommended for women who have family members with BRCA-related cancers. BRCA-related cancers include:  Breast.  Ovarian.  Tubal.  Peritoneal cancers.  Results of the assessment will determine the need for genetic counseling and BRCA1 and BRCA2 testing. Cervical Cancer Your health care provider may recommend that you be screened regularly for cancer of the pelvic organs (ovaries, uterus, and vagina). This screening involves a pelvic examination, including checking for microscopic changes to the surface of your cervix (Pap test). You may be encouraged to have this screening done every 3 years, beginning at age 63.  For women ages 24-65, health care providers may recommend pelvic exams and Pap testing every 3 years, or they may recommend the Pap and pelvic exam, combined with testing for human papilloma virus (HPV), every 5 years. Some types of HPV increase your risk of cervical cancer. Testing for HPV may also be done on women of any age with unclear Pap test results.  Other health care providers may not recommend any screening for nonpregnant women who are considered low risk for pelvic cancer and who do not have symptoms. Ask your health care provider if a screening pelvic exam is right for you.  If you have had past treatment for cervical cancer or a condition that could lead to cancer, you need Pap tests and screening for cancer for at least 20 years after your treatment. If Pap tests have been discontinued, your risk factors (such as having a new sexual partner) need to be reassessed to determine if screening should resume. Some women have medical problems that increase the chance of getting cervical cancer. In these cases, your health care provider may recommend more frequent screening and Pap tests. Colorectal Cancer  This type of cancer can be detected and often prevented.  Routine colorectal cancer screening usually begins at 66 years of  age and continues through 66 years of age.  Your health care provider may recommend screening at an earlier age if you have risk factors for colon cancer.  Your health care provider may also recommend using home test kits to check for hidden blood in the stool.  A small camera at the end of a tube can be used to examine your colon directly (sigmoidoscopy or colonoscopy). This is done to check for the earliest forms of colorectal cancer.  Routine screening usually begins at age 27.  Direct examination of the colon should be repeated every 5-10 years through 66 years of age. However, you may need to be screened more often if early forms of precancerous polyps or small growths are found. Skin Cancer  Check your skin from head to toe regularly.  Tell your health care provider about any new moles or changes in moles, especially if there is a change in a mole's shape or color.  Also tell your health care provider if you have a mole that is larger than the size of a pencil eraser.  Always use sunscreen. Apply sunscreen liberally and repeatedly throughout the day.  Protect yourself by wearing long sleeves, pants, a wide-brimmed hat,  and sunglasses whenever you are outside. HEART DISEASE, DIABETES, AND HIGH BLOOD PRESSURE   High blood pressure causes heart disease and increases the risk of stroke. High blood pressure is more likely to develop in:  People who have blood pressure in the high end of the normal range (130-139/85-89 mm Hg).  People who are overweight or obese.  People who are African American.  If you are 66-53 years of age, have your blood pressure checked every 3-5 years. If you are 53 years of age or older, have your blood pressure checked every year. You should have your blood pressure measured twice--once when you are at a hospital or clinic, and once when you are not at a hospital or clinic. Record the average of the two measurements. To check your blood pressure when you are  not at a hospital or clinic, you can use:  An automated blood pressure machine at a pharmacy.  A home blood pressure monitor.  If you are between 22 years and 47 years old, ask your health care provider if you should take aspirin to prevent strokes.  Have regular diabetes screenings. This involves taking a blood sample to check your fasting blood sugar level.  If you are at a normal weight and have a low risk for diabetes, have this test once every three years after 66 years of age.  If you are overweight and have a high risk for diabetes, consider being tested at a younger age or more often. PREVENTING INFECTION  Hepatitis B  If you have a higher risk for hepatitis B, you should be screened for this virus. You are considered at high risk for hepatitis B if:  You were born in a country where hepatitis B is common. Ask your health care provider which countries are considered high risk.  Your parents were born in a high-risk country, and you have not been immunized against hepatitis B (hepatitis B vaccine).  You have HIV or AIDS.  You use needles to inject street drugs.  You live with someone who has hepatitis B.  You have had sex with someone who has hepatitis B.  You get hemodialysis treatment.  You take certain medicines for conditions, including cancer, organ transplantation, and autoimmune conditions. Hepatitis C  Blood testing is recommended for:  Everyone born from [birth year] through 1966.  Anyone with known risk factors for hepatitis C. Sexually transmitted infections (STIs)  You should be screened for sexually transmitted infections (STIs) including gonorrhea and chlamydia if:  You are sexually active and are younger than 66 years of age.  You are older than 66 years of age and your health care provider tells you that you are at risk for this type of infection.  Your sexual activity has changed since you were last screened and you are at an increased risk for  chlamydia or gonorrhea. Ask your health care provider if you are at risk.  If you do not have HIV, but are at risk, it may be recommended that you take a prescription medicine daily to prevent HIV infection. This is called pre-exposure prophylaxis (PrEP). You are considered at risk if:  You are sexually active and do not regularly use condoms or know the HIV status of your partner(s).  You take drugs by injection.  You are sexually active with a partner who has HIV. Talk with your health care provider about whether you are at high risk of being infected with HIV. If you choose to begin PrEP, you should  first be tested for HIV. You should then be tested every 3 months for as long as you are taking PrEP.  PREGNANCY   If you are premenopausal and you may become pregnant, ask your health care provider about preconception counseling.  If you may become pregnant, take 400 to 800 micrograms (mcg) of folic acid every day.  If you want to prevent pregnancy, talk to your health care provider about birth control (contraception). OSTEOPOROSIS AND MENOPAUSE   Osteoporosis is a disease in which the bones lose minerals and strength with aging. This can result in serious bone fractures. Your risk for osteoporosis can be identified using a bone density scan.  If you are 50 years of age or older, or if you are at risk for osteoporosis and fractures, ask your health care provider if you should be screened.  Ask your health care provider whether you should take a calcium or vitamin D supplement to lower your risk for osteoporosis.  Menopause may have certain physical symptoms and risks.  Hormone replacement therapy may reduce some of these symptoms and risks. Talk to your health care provider about whether hormone replacement therapy is right for you.  HOME CARE INSTRUCTIONS   Schedule regular health, dental, and eye exams.  Stay current with your immunizations.   Do not use any tobacco products  including cigarettes, chewing tobacco, or electronic cigarettes.  If you are pregnant, do not drink alcohol.  If you are breastfeeding, limit how much and how often you drink alcohol.  Limit alcohol intake to no more than 1 drink per day for nonpregnant women. One drink equals 12 ounces of beer, 5 ounces of wine, or 1 ounces of hard liquor.  Do not use street drugs.  Do not share needles.  Ask your health care provider for help if you need support or information about quitting drugs.  Tell your health care provider if you often feel depressed.  Tell your health care provider if you have ever been abused or do not feel safe at home.   This information is not intended to replace advice given to you by your health care provider. Make sure you discuss any questions you have with your health care provider.   Document Released: 04/02/2011 Document Revised: 10/08/2014 Document Reviewed: 08/19/2013 Elsevier Interactive Patient Education [employer].

## 2016-05-07 NOTE — Progress Notes (Signed)
Subjective:   [name] is a 65 y.o. female who presents for Medicare Annual (Subsequent) preventive examination.   HRA assessment completed during this visit with [name]  The Patient was informed that the wellness visit is to identify future health risk and educate and initiate measures that can reduce risk for increased disease through the lifespan.    NO ROS; Medicare Wellness Visit Last OV:  03/2016  Labs completed: 11/2015 (Vit D 25; B12; 421 down; A1c 8.8 from 7.0) Lipids; 2116; trig 88; DLD 113; HDl 72 Broke arm x 2 years ago In w/c; long time;   Was using walker but legs get very weak Doesn't get to walk a lot; states when she does; her legs hurt  DM2, - worse, 8.8 from 7/ States on 03/2016; States bs are good 120 now Rehab for gait disturbance  Repair of humerus/ states strength is arms is good;   Psychosocial: ( family + HD and HTN) Tobacco; 20 pack years; current one pack a day smoker  ETOH 1 glass of wine daily   Medications reviewed for issues; compliance; otc meds  BMI: 22  Diet;  Breakfast; granola bar for breakfast, Lunch; yogurt, bagel with cream cheese; grapes Some kind of fruit Dinner; spouse will Neurosurgeon and vegetables; spouse is a good cook;  Other activities:  Has a garden  Goes out to dinner;  Goes to the park  Visits her mother   Teeth or Denture issues? Regular dental care Will have check with [facility] in [place] as she was going to [facility] in [place] but was closed   Exercise;  bike at home Exercise; x 20 minutes x 2 days;  Walks from chair to toilet by herself Holds rails; Agreed to try and walk x 10 minutes 3 times a day because her goal is to walk; PT did give her exercise;   HOME SAFETY 9:30 - 5 m-Monday - Friday; sitter Saturday 3 hour; sitter  Spouse is with her the rest of the time Depend on other to cook Are independent to bathroom  Given information [employer];  Can put bars or assist  with handicapped updates if needed ;   Reviewed for short term and long term plan Has a son; Dtr died many years ago;  Son 28 born Christmas Day;  Grand-dtr is a big help  Personal safety issues reviewed for risk such as safe community; smoke Dispensing optician; firearms safety if applicable; protection when in the sun( not in the sun) ;  Has a deck and likes to be outside  Driving safety for seniors or any recent accidents; does not drive;  Fall hx; no   Fear of falling? All the time!  Has a Life alert  Home' walk in shower; chair in shower Dress herself; sometimes with assistance   UA or BOWEL incontinence; no issues   Functional losses from last year to this year? All about the same;  Trying to keep function Do them every day;  Educated on energy conservation;  Given education on "Fall Prevention in the Home" for more safety tips the patient can apply as appropriate.   Risk for Depression reviewed: Any emotional problems? Anxious, depressed, irritable, sad or blue? No  Denies feeling depressed or hopeless; voices pleasure in daily life How many social activities have you been engaged in within the last 2 weeks? none  Sleep / have to have medication;  Meds do help   Cognitive;  Keep memory notebook;  Has assistance w checkbook, medications; no failures of task Ad8 score reviewed for issues;  Issues making decisions; no  Less interest in hobbies / activities" no  Adult coloring books; read;   Repeats questions, stories; family complaining: NO  Trouble using ordinary gadgets; microwave; computer: no issues uising ipad   Forgets the month or year: no  Mismanaging finances:  She does her own;   Missing apt: no but does write them down  Daily problems with thinking of memory NO Ad8 score is 0   Advanced Directive addressed; has completed  HCPOA; spouse  Bring a copy in   Counseling Health Maintenance Gaps: Hep c educated and will wait until next lab draw Urine  Microalbumin; to be done at next lab draw HIV; discussed and referred to md Foot exam/sees podiatrist;  Colonoscopy; 04/2003; In [facility] states done 2014; due 2024 Will fup on due date EKG: 06/2014 Mammogram: 06/2011/ mammogram with implants Will check with the breast center for repeat exams  Dexa/ 2008 /DUE / will repeat since 64 / pre screen post menopausal  PAP: no plans to continue    Immunizations Due: (Vaccines reviewed and educated regarding any overdue)  Zostavax due Ophthalmology exam Dr/ Ashley Royalty; for eyes had one this year and DM was ruled out We have last years report / will check on this year  Hearing: good  Established and updated Risk reviewed and appropriate referral made or health recommendations:  Current Care Team reviewed and updated Dr. Ashley Royalty for eyes Dr. Allena Katz Neurologist Dr. Vear Clock is pain management  Barriers to Success Cardiac Risk Factors include: advanced age (>78men, >29 women);diabetes mellitus;dyslipidemia;family history of premature cardiovascular disease;sedentary lifestyle     Objective:     Vitals: Ht 5\' 5"  (1.651 m)   Wt 135 lb 8 oz (61.5 kg)   BMI 22.55 kg/m   Body mass index is 22.55 kg/m.   Tobacco History  Smoking Status  . Current Every Day Smoker  . Packs/day: 1.00  . Years: 20.00  . Types: Cigarettes  Smokeless Tobacco  . Never Used     Ready to quit: Not Answered Counseling given: Yes   Past Medical History:  Diagnosis Date  . Anoxic brain injury (HCC)   . Anxiety   . Chronic neck pain   . Chronic pain in shoulder   . COPD (chronic obstructive pulmonary disease) (HCC)   . Depression   . Fracture of left humerus 06/24/2014  . Gait disorder   . GERD (gastroesophageal reflux disease)   . Hernia of abdominal cavity   . 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 Hyperlipidemia   . LBP (low back pain)   . Neuropathy (HCC)   . Osteoarthritis   . Seizure disorder (HCC)   . Seizures  (HCC)   . Type II or unspecified type diabetes mellitus without mention of complication, not stated as uncontrolled 2010  . Vitamin B 12 deficiency 2011   Past Surgical History:  Procedure Laterality Date  . COLONOSCOPY    . DIAGNOSTIC LAPAROSCOPY     PMH: Exploratory lap  . HERNIA REPAIR     incisional  . ORIF HUMERUS FRACTURE Left 06/24/2014   Procedure: LEFT OPEN REDUCTION INTERNAL FIXATION (ORIF) PROXIMAL HUMERUS FRACTURE;  Surgeon: Eulas Post, MD;  Location: [facility] OR;  Service: Orthopedics;  Laterality: Left;  . PANCREATECTOMY    . TONSILLECTOMY    . TUBAL LIGATION     Family History  Problem Relation Age  of Onset  . Heart disease Father 85    MI  . Hypertension Other    History  Sexual Activity  . Sexual activity: Yes    Outpatient Encounter Prescriptions as of 05/07/2016  Medication Sig  . carbidopa-levodopa (SINEMET IR) 25-250 MG tablet Take 1 tablet by mouth 3 (three) times daily.  . cholecalciferol (VITAMIN D) 1000 UNITS tablet Take 2 tablets (2,000 Units total) by mouth daily.  . empagliflozin (JARDIANCE) 10 MG TABS tablet Take 10 mg by mouth daily.  Marland Kitchen HYDROmorphone (DILAUDID) 2 MG tablet Take 2-3 tablets (4-6 mg total) by mouth every 6 (six) hours as needed for moderate pain or severe pain.  Marland Kitchen l-methylfolate-B6-B12 (METANX) 3-35-2 MG TABS Take 1 tablet by mouth 2 (two) times daily.  Marland Kitchen LORazepam (ATIVAN) 2 MG tablet TAKE ONE-HALF TO ONE TABLET BY MOUTH 3 TIMES A DAY  . metFORMIN (GLUCOPHAGE-XR) 500 MG 24 hr tablet TAKE 1 TABLET THREE TIMES A DAY  . mirtazapine (REMERON) 15 MG tablet TAKE 1 TABLET BY MOUTH AT BEDTIME AT 6-8 PM  . promethazine (PHENERGAN) 12.5 MG tablet Take 1 tablet (12.5 mg total) by mouth every 6 (six) hours as needed. for nausea  . promethazine (PHENERGAN) 12.5 MG tablet TAKE 1 TABLET BY MOUTH EVERY 6 HOURS AS NEEDED FOR NAUSEA  . tolterodine (DETROL LA) 4 MG 24 hr capsule Take 4 mg by mouth at bedtime.  . tolterodine (DETROL LA) 4 MG 24 hr  capsule TAKE ONE CAPSULE AT BEDTIME  . Vitamin D, Ergocalciferol, (DRISDOL) 50000 units CAPS capsule TAKE 1 CAPSULE (50,000 UNITS TOTAL) BY MOUTH ONCE A WEEK.  Marland Kitchen zolpidem (AMBIEN) 10 MG tablet TAKE 1 TABLET AT BEDTIME AS NEEDED FOR SLEEP  . aspirin 81 MG EC tablet Take 81 mg by mouth daily.    Marland Kitchen morphine (MS CONTIN) 15 MG 12 hr tablet Take 15 mg by mouth every 12 (twelve) hours. Reported on 12/28/2015  . sennosides-docusate sodium (SENOKOT-S) 8.6-50 MG tablet Take 2 tablets by mouth daily. (Patient not taking: Reported on 05/07/2016)  . zoster vaccine live, PF, (ZOSTAVAX) 40981 UNT/0.65ML injection Inject 19,400 Units into the skin once. (Patient not taking: Reported on 05/07/2016)   No facility-administered encounter medications on file as of 05/07/2016.     Activities of Daily Living In your present state of health, do you have any difficulty performing the following activities: 05/07/2016  Hearing? N  Vision? N  Difficulty concentrating or making decisions? Y  Walking or climbing stairs? Y  Dressing or bathing? Y  Doing errands, shopping? Y  Preparing Food and eating ? Y  Using the Toilet? N  In the past six months, have you accidently leaked urine? N  Do you have problems with loss of bowel control? N  Managing your Medications? Y  Managing your Finances? Y  Housekeeping or managing your Housekeeping? Y  Some recent data might be hidden    Patient Care Team: Tresa Garter, MD as PCP - General Evie Lacks, MD as Consulting Physician (Neurology) Thyra Breed, MD as Consulting Physician (Anesthesiology)    Assessment:    Encouraged exercise per PT recommendations for 10 min 3 times a day;  Has had eye exam / Dr. Ashley Royalty this year  Has podiatrist that checks feet  Exercise Activities and Dietary recommendations Current Exercise Habits: Home exercise routine, Type of exercise: strength training/weights, Time (Minutes): 15, Intensity: Mild  Goals    . Exercise 150 minutes  per week (moderate activity)  Uses walker at home;  Does step exercise with the walker;  Legs hurt when she exercises       Fall Risk Fall Risk  12/28/2015  Falls in the past year? Yes  Number falls in past yr: 2 or more   Depression Screen PHQ 2/9 Scores 05/07/2016 12/28/2015  PHQ - 2 Score 0 0     Cognitive Testing MMSE - Mini Mental State Exam 05/07/2016  Not completed: (No Data)   Some recall issues; uses memory notes Uses Ipad and manages finances; spouse helps with cooking; other  Immunization History  Administered Date(s) Administered  . Influenza Whole 09/19/2007, 06/25/2008, 06/08/2009, 06/30/2010, 06/02/2011, 08/01/2012  . Influenza,inj,Quad PF,36+ Mos 07/13/2013  . Influenza-Unspecified 07/02/2015  . Pneumococcal Conjugate-13 12/08/2014  . Pneumococcal Polysaccharide-23 10/26/2013   Screening Tests Health Maintenance  Topic Date Due  . Hepatitis C Screening  [birth year]  . URINE MICROALBUMIN  08/30/1960  . HIV Screening  08/30/1965  . ZOSTAVAX  08/30/2010  . FOOT EXAM  10/21/2010  . DEXA SCAN  08/31/2015  . OPHTHALMOLOGY EXAM  12/22/2015  . INFLUENZA VACCINE  05/01/2016  . PAP SMEAR  05/06/2017 (Originally 08/31/1971)  . HEMOGLOBIN A1C  06/29/2016  . MAMMOGRAM  04/22/2017  . PNA vac Low Risk Adult (2 of 2 - PPSV23) 10/26/2018  . COLONOSCOPY  10/01/2022  . TETANUS/TDAP  11/01/2024      Plan:     Will take hep c at next blood draw Will also have ur Albumin drawn at next lab  Mammogram not sure when; Will check with Breast center on update  [facility]  Educated to check with insurance regarding coverage of Shingles vaccination on Part D or Part B and may have lower co-pay if provided on the Part D side  Will bring a copy of Advanced Directive HCPOA when convenient   Vit D 50000 units are 12.00 for all 6; Please check with insurance for Rx;   Spouse picked up meds 8/6 per the pharmacy  Refill remaining on  ambien and remeron Discussed Vit d 50,000 units (6 ) for total of 12.00 Per the pharmacist; need order but insurance does not pay because it is OTC; Pharmacy will not dispense without order    During the course of the visit the patient was educated and counseled about the following appropriate screening and preventive services:   Vaccines to include Pneumoccal, Influenza, Hepatitis B, Td, Zostavax, HCV / Zostavax due;   Electrocardiogram  06/2014  Cardiovascular Disease/ BP good; BMI good  Colorectal cancer screening/ due 10/2022  Bone density screening/ to repeat  Diabetes screening/ states bs are better; doesn't manage meds; spouse does  Glaucoma screening/ Dr. Dione BoozeGroat completed this year  Mammography/ due now; ? 03/2017   Nutrition counseling   Patient Instructions (the written plan) was given to the patient.   Montine CircleHauck,Ronnie Mallette, RN  05/07/2016

## 2016-05-08 ENCOUNTER — Other Ambulatory Visit: Payer: Self-pay

## 2016-05-08 DIAGNOSIS — E119 Type 2 diabetes mellitus without complications: Secondary | ICD-10-CM

## 2016-05-08 DIAGNOSIS — Z729 Problem related to lifestyle, unspecified: Secondary | ICD-10-CM

## 2016-05-17 DIAGNOSIS — Z79891 Long term (current) use of opiate analgesic: Secondary | ICD-10-CM | POA: Diagnosis not present

## 2016-05-17 DIAGNOSIS — G894 Chronic pain syndrome: Secondary | ICD-10-CM | POA: Diagnosis not present

## 2016-05-17 DIAGNOSIS — M47816 Spondylosis without myelopathy or radiculopathy, lumbar region: Secondary | ICD-10-CM | POA: Diagnosis not present

## 2016-06-14 DIAGNOSIS — M47816 Spondylosis without myelopathy or radiculopathy, lumbar region: Secondary | ICD-10-CM | POA: Diagnosis not present

## 2016-06-14 DIAGNOSIS — Z79891 Long term (current) use of opiate analgesic: Secondary | ICD-10-CM | POA: Diagnosis not present

## 2016-06-14 DIAGNOSIS — G894 Chronic pain syndrome: Secondary | ICD-10-CM | POA: Diagnosis not present

## 2016-06-14 DIAGNOSIS — M25512 Pain in left shoulder: Secondary | ICD-10-CM | POA: Diagnosis not present

## 2016-06-22 DIAGNOSIS — L603 Nail dystrophy: Secondary | ICD-10-CM | POA: Diagnosis not present

## 2016-06-22 DIAGNOSIS — E1151 Type 2 diabetes mellitus with diabetic peripheral angiopathy without gangrene: Secondary | ICD-10-CM | POA: Diagnosis not present

## 2016-06-22 DIAGNOSIS — I739 Peripheral vascular disease, unspecified: Secondary | ICD-10-CM | POA: Diagnosis not present

## 2016-06-30 ENCOUNTER — Other Ambulatory Visit: Payer: Self-pay | Admitting: Internal Medicine

## 2016-07-02 ENCOUNTER — Ambulatory Visit (INDEPENDENT_AMBULATORY_CARE_PROVIDER_SITE_OTHER): Payer: Medicare Other | Admitting: Internal Medicine

## 2016-07-02 ENCOUNTER — Telehealth: Payer: Self-pay | Admitting: Emergency Medicine

## 2016-07-02 ENCOUNTER — Encounter: Payer: Self-pay | Admitting: Internal Medicine

## 2016-07-02 ENCOUNTER — Ambulatory Visit (INDEPENDENT_AMBULATORY_CARE_PROVIDER_SITE_OTHER)
Admission: RE | Admit: 2016-07-02 | Discharge: 2016-07-02 | Disposition: A | Discharge status: Home / Self Care | Payer: Medicare Other | Source: Ambulatory Visit | Attending: Internal Medicine | Admitting: Internal Medicine

## 2016-07-02 DIAGNOSIS — E538 Deficiency of other specified B group vitamins: Secondary | ICD-10-CM | POA: Diagnosis not present

## 2016-07-02 DIAGNOSIS — E1100 Type 2 diabetes mellitus with hyperosmolarity without nonketotic hyperglycemic-hyperosmolar coma (NKHHC): Secondary | ICD-10-CM | POA: Diagnosis not present

## 2016-07-02 DIAGNOSIS — Z780 Asymptomatic menopausal state: Secondary | ICD-10-CM

## 2016-07-02 DIAGNOSIS — G931 Anoxic brain damage, not elsewhere classified: Secondary | ICD-10-CM | POA: Diagnosis not present

## 2016-07-02 DIAGNOSIS — Z23 Encounter for immunization: Secondary | ICD-10-CM

## 2016-07-02 DIAGNOSIS — E559 Vitamin D deficiency, unspecified: Secondary | ICD-10-CM

## 2016-07-02 MED ORDER — ZOLPIDEM TARTRATE 10 MG PO TABS: 10.0000 mg | ORAL_TABLET | Freq: Every evening | ORAL | 2 refills | Status: DC | PRN

## 2016-07-02 MED ORDER — LORAZEPAM 2 MG PO TABS: 1.0000 mg | ORAL_TABLET | Freq: Three times a day (TID) | ORAL | 3 refills | Status: DC

## 2016-07-02 NOTE — Assessment & Plan Note (Signed)
Chronic  On B12 

## 2016-07-02 NOTE — Assessment & Plan Note (Signed)
Needs a w/c Rx

## 2016-07-02 NOTE — Assessment & Plan Note (Signed)
On Vit D 

## 2016-07-02 NOTE — Progress Notes (Signed)
Pre visit review using our clinic review tool, if applicable. No additional management support is needed unless otherwise documented below in the visit note. 

## 2016-07-02 NOTE — Telephone Encounter (Signed)
I had pended refills for PCp to print/sign/give to pt during her OV today. He did not sign them while she was in office. I printed Rxs, signed rxs faxed to [facility]. Pt informed

## 2016-07-02 NOTE — Telephone Encounter (Signed)
Pt called and stated she just left the office and went to the pharmacy. Her prescriptions for LORazepam (ATIVAN) 2 MG tablet and zolpidem (AMBIEN) 10 MG tablet have not be called in yet. [facility]acy is [facility] [address] Please follow up and let her know when these have been called in thanks.

## 2016-07-02 NOTE — Assessment & Plan Note (Addendum)
Chronic On Metformin Not taking Jardiance ?reason. Labs The pt refused labs today

## 2016-07-02 NOTE — Progress Notes (Signed)
Subjective:  Patient ID: [name], female    DOB: 02/17/1951  Age: 65 y.o. MRN: [id number]  CC: No chief complaint on file.   HPI [name] presents for DM, anxiety, gait disorder  Outpatient Medications Prior to Visit  Medication Sig Dispense Refill  . aspirin 81 MG EC tablet Take 81 mg by mouth daily.      . carbidopa-levodopa (SINEMET IR) 25-250 MG tablet Take 1 tablet by mouth 3 (three) times daily. 270 tablet 3  . cholecalciferol (VITAMIN D) 1000 UNITS tablet Take 2 tablets (2,000 Units total) by mouth daily. 100 tablet 3  . empagliflozin (JARDIANCE) 10 MG TABS tablet Take 10 mg by mouth daily. 30 tablet 11  . HYDROmorphone (DILAUDID) 2 MG tablet Take 2-3 tablets (4-6 mg total) by mouth every 6 (six) hours as needed for moderate pain or severe pain. 75 tablet 0  . l-methylfolate-B6-B12 (METANX) 3-35-2 MG TABS Take 1 tablet by mouth 2 (two) times daily. 180 tablet 3  . LORazepam (ATIVAN) 2 MG tablet TAKE ONE-HALF TO ONE TABLET BY MOUTH 3 TIMES A DAY 90 tablet 3  . metFORMIN (GLUCOPHAGE-XR) 500 MG 24 hr tablet TAKE 1 TABLET THREE TIMES A DAY 270 tablet 1  . mirtazapine (REMERON) 15 MG tablet TAKE 1 TABLET BY MOUTH AT BEDTIME AT 6-8 PM 90 tablet 3  . morphine (MS CONTIN) 15 MG 12 hr tablet Take 15 mg by mouth every 12 (twelve) hours. Reported on 12/28/2015    . promethazine (PHENERGAN) 12.5 MG tablet Take 1 tablet (12.5 mg total) by mouth every 6 (six) hours as needed. for nausea 60 tablet 1  . promethazine (PHENERGAN) 12.5 MG tablet TAKE 1 TABLET BY MOUTH EVERY 6 HOURS AS NEEDED FOR NAUSEA 60 tablet 3  . sennosides-docusate sodium (SENOKOT-S) 8.6-50 MG tablet Take 2 tablets by mouth daily. 30 tablet 1  . tolterodine (DETROL LA) 4 MG 24 hr capsule Take 4 mg by mouth at bedtime.    . tolterodine (DETROL LA) 4 MG 24 hr capsule TAKE ONE CAPSULE AT BEDTIME 90 capsule 3  . Vitamin D, Ergocalciferol, (DRISDOL) 50000 units CAPS capsule TAKE 1 CAPSULE (50,000 UNITS TOTAL) BY MOUTH ONCE A  WEEK. 6 capsule 0  . zolpidem (AMBIEN) 10 MG tablet TAKE 1 TABLET AT BEDTIME AS NEEDED FOR SLEEP 30 tablet 2  . zoster vaccine live, PF, (ZOSTAVAX) [id number] UNT/0.65ML injection Inject 19,400 Units into the skin once. 1 each 0   No facility-administered medications prior to visit.     ROS Review of Systems  Constitutional: Negative for activity change, appetite change, chills, diaphoresis, fatigue, fever and unexpected weight change.  HENT: Negative for congestion, ear pain, facial swelling, hearing loss, mouth sores, nosebleeds, postnasal drip, rhinorrhea, sinus pressure, sneezing, sore throat, tinnitus and trouble swallowing.   Eyes: Negative for pain, discharge, redness, itching and visual disturbance.  Respiratory: Negative for cough, chest tightness, shortness of breath, wheezing and stridor.   Cardiovascular: Negative for chest pain, palpitations and leg swelling.  Gastrointestinal: Negative for abdominal distention, anal bleeding, blood in stool, constipation, diarrhea, nausea and rectal pain.  Genitourinary: Negative for difficulty urinating, dysuria, flank pain, frequency, genital sores, hematuria, pelvic pain, urgency, vaginal bleeding and vaginal discharge.  Musculoskeletal: Positive for arthralgias and gait problem. Negative for back pain, joint swelling, neck pain and neck stiffness.  Skin: Negative.  Negative for rash.  Neurological: Positive for weakness and numbness. Negative for dizziness, tremors, seizures, syncope, speech difficulty and headaches.  Hematological:  Negative for adenopathy. Does not bruise/bleed easily.  Psychiatric/Behavioral: Positive for decreased concentration. Negative for behavioral problems, dysphoric mood, sleep disturbance and suicidal ideas. The patient is nervous/anxious.     Objective:  BP 128/62   Pulse 75   Temp 99 F (37.2 C) (Oral)   Wt 136 lb (61.7 kg)   SpO2 95%   BMI 22.63 kg/m   BP Readings from Last 3 Encounters:  07/02/16 128/62    03/28/16 130/78  12/28/15 140/70    Wt Readings from Last 3 Encounters:  07/02/16 136 lb (61.7 kg)  05/07/16 135 lb 8 oz (61.5 kg)  03/28/16 134 lb (60.8 kg)    Physical Exam  Constitutional: She appears well-developed. No distress.  HENT:  Head: Normocephalic.  Right Ear: External ear normal.  Left Ear: External ear normal.  Nose: Nose normal.  Mouth/Throat: Oropharynx is clear and moist.  Eyes: Conjunctivae are normal. Pupils are equal, round, and reactive to light. Right eye exhibits no discharge. Left eye exhibits no discharge.  Neck: Normal range of motion. Neck supple. No JVD present. No tracheal deviation present. No thyromegaly present.  Cardiovascular: Normal rate, regular rhythm and normal heart sounds.   Pulmonary/Chest: No stridor. No respiratory distress. She has no wheezes.  Abdominal: Soft. Bowel sounds are normal. She exhibits no distension and no mass. There is no tenderness. There is no rebound and no guarding.  Musculoskeletal: She exhibits no edema or tenderness.  Lymphadenopathy:    She has no cervical adenopathy.  Neurological: She displays abnormal reflex. No cranial nerve deficit. She exhibits abnormal muscle tone. Coordination abnormal.  Skin: No rash noted. No erythema.  Psychiatric: Her behavior is normal. Judgment and thought content normal.  in a w/c  Lab Results  Component Value Date   WBC 12.4 (H) 12/28/2015   HGB 14.8 12/28/2015   HCT 44.0 12/28/2015   PLT 241.0 12/28/2015   GLUCOSE 280 (H) 12/28/2015   CHOL 203 (H) 03/11/2015   TRIG 88.0 03/11/2015   HDL 72.20 03/11/2015   LDLDIRECT 100.8 10/26/2013   LDLCALC 113 (H) 03/11/2015   ALT 2 03/11/2015   AST 12 03/11/2015   NA 134 (L) 12/28/2015   K 4.6 12/28/2015   CL 97 12/28/2015   CREATININE 0.62 12/28/2015   BUN 15 12/28/2015   CO2 30 12/28/2015   TSH 1.43 12/28/2015   INR 1.0 ratio 10/21/2009   HGBA1C 8.8 (H) 12/28/2015    No results found.  Assessment & Plan:   There are  no diagnoses linked to this encounter. I am having [name] maintain her aspirin, l-methylfolate-B6-B12, morphine, tolterodine, sennosides-docusate sodium, HYDROmorphone, metFORMIN, cholecalciferol, zoster vaccine live (PF), carbidopa-levodopa, promethazine, empagliflozin, promethazine, tolterodine, LORazepam, Vitamin D (Ergocalciferol), mirtazapine, and zolpidem.  No orders of the defined types were placed in this encounter.    Follow-up: No Follow-up on file.  [name], MD

## 2016-07-02 NOTE — Telephone Encounter (Signed)
Patient states she did not get an written script for these and that she was told they would be sent to pharmacy today.  Please follow up with patient in regard.

## 2016-07-03 ENCOUNTER — Inpatient Hospital Stay: Admission: RE | Admit: 2016-07-03 | Payer: Medicare Other | Source: Ambulatory Visit

## 2016-07-09 ENCOUNTER — Other Ambulatory Visit: Payer: Self-pay | Admitting: Internal Medicine

## 2016-07-10 DIAGNOSIS — M25512 Pain in left shoulder: Secondary | ICD-10-CM | POA: Diagnosis not present

## 2016-07-10 DIAGNOSIS — G894 Chronic pain syndrome: Secondary | ICD-10-CM | POA: Diagnosis not present

## 2016-07-10 DIAGNOSIS — M47816 Spondylosis without myelopathy or radiculopathy, lumbar region: Secondary | ICD-10-CM | POA: Diagnosis not present

## 2016-07-10 DIAGNOSIS — Z79891 Long term (current) use of opiate analgesic: Secondary | ICD-10-CM | POA: Diagnosis not present

## 2016-07-17 ENCOUNTER — Ambulatory Visit (HOSPITAL_COMMUNITY)
Admission: EM | Admit: 2016-07-17 | Discharge: 2016-07-17 | Disposition: A | Discharge status: Home / Self Care | Payer: Medicare Other | Attending: Family Medicine | Admitting: Family Medicine

## 2016-07-17 ENCOUNTER — Encounter (HOSPITAL_COMMUNITY): Payer: Self-pay | Admitting: *Deleted

## 2016-07-17 ENCOUNTER — Ambulatory Visit (INDEPENDENT_AMBULATORY_CARE_PROVIDER_SITE_OTHER): Payer: Medicare Other

## 2016-07-17 DIAGNOSIS — R05 Cough: Secondary | ICD-10-CM | POA: Diagnosis not present

## 2016-07-17 DIAGNOSIS — J069 Acute upper respiratory infection, unspecified: Secondary | ICD-10-CM

## 2016-07-17 MED ORDER — LEVOFLOXACIN 500 MG PO TABS: 500.0000 mg | ORAL_TABLET | Freq: Every day | ORAL | 0 refills | Status: DC

## 2016-07-17 NOTE — ED Triage Notes (Signed)
Pt  Reports   Symptoms  Of  Cough  /   sorethroat           Pain  Roof of  Mouth     X  Several   Days      Pt  Has  A  History  Of  cva   And    Is  A  Smoker      As   Well    Pt    Has   A  carefiver  At  Bedside  Pt  Is  Congested  With  Phlegm

## 2016-07-17 NOTE — Discharge Instructions (Signed)
Take all of medicine, drink lots of fluids, no more smoking, see your doctor if further problems  °

## 2016-07-17 NOTE — ED Provider Notes (Signed)
[facility]    CSN: [id number] Arrival date & time: 07/17/16  1023     History   Chief Complaint Chief Complaint  Patient presents with  . Cough    HPI [name] is a 65 y.o. female.   The history is provided by the patient and a caregiver.  Cough  Cough characteristics:  Productive Sputum characteristics:  Yellow Severity:  Moderate Onset quality:  Gradual Duration:  5 days Chronicity:  Recurrent Smoker: yes   Relieved by:  None tried Worsened by:  Nothing Ineffective treatments:  None tried Associated symptoms: fever, rhinorrhea, shortness of breath and sore throat     Past Medical History:  Diagnosis Date  . Anoxic brain injury (HCC)   . Anxiety   . Chronic neck pain   . Chronic pain in shoulder   . COPD (chronic obstructive pulmonary disease) (HCC)   . Depression   . Fracture of left humerus 06/24/2014  . Gait disorder   . GERD (gastroesophageal reflux disease)   . Hernia of abdominal cavity   . 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 Hyperlipidemia   . LBP (low back pain)   . Neuropathy (HCC)   . Osteoarthritis   . Seizure disorder (HCC)   . Seizures (HCC)   . Type II or unspecified type diabetes mellitus without mention of complication, not stated as uncontrolled 2010  . Vitamin B 12 deficiency 2011    Patient Active Problem List   Diagnosis Date Noted  . Anoxic brain injury (HCC) 12/29/2015  . Gait disorder 12/28/2015  . Well adult exam 12/08/2014  . Fracture of left humerus 06/24/2014  . Humerus fracture 06/24/2014  . Chest wall contusion 05/03/2014  . Acute bronchitis 11/10/2013  . Left elbow pain 11/03/2013  . Right facial pain 06/19/2013  . Ankle ulcer (HCC) 04/02/2012  . Nausea & vomiting 10/05/2011  . Decubitus ulcer 06/26/2011  . Decubitus ulcer of coccyx 05/21/2011  . Cellulitis and abscess of other specified site 10/11/2010  . B12 deficiency 04/04/2010  . HYPERLIPIDEMIA 04/04/2010   . Anxiety state 11/14/2009  . Vitamin D deficiency 10/21/2009  . MELENA 10/21/2009  . DM2 (diabetes mellitus, type 2) (HCC) 07/07/2009  . FEVER, RECURRENT 06/21/2009  . COPD 02/25/2009  . OSTEOARTHRITIS 02/25/2009  . LOW BACK PAIN 02/25/2009  . TOBACCO USER 01/13/2009  . Rash and other nonspecific skin eruption 01/13/2009  . FURUNCLE 06/08/2008  . DEPRESSION 02/26/2007  . PAIN, CHRONIC NEC 02/26/2007  . VENOUS INSUFFICIENCY, LEGS 02/26/2007  . GERD 02/26/2007  . HERNIA, INCISIONAL 02/26/2007  . SHOULDER PAIN, LEFT 02/26/2007  . SEIZURE DISORDER 02/26/2007  . INSOMNIA 02/26/2007  . Abnormality of gait 02/26/2007  . LEG EDEMA, CHRONIC 02/26/2007    Past Surgical History:  Procedure Laterality Date  . COLONOSCOPY    . DIAGNOSTIC LAPAROSCOPY     PMH: Exploratory lap  . HERNIA REPAIR     incisional  . ORIF HUMERUS FRACTURE Left 06/24/2014   Procedure: LEFT OPEN REDUCTION INTERNAL FIXATION (ORIF) PROXIMAL HUMERUS FRACTURE;  Surgeon: Eulas PostJoshua P Landau, MD;  Location: [facility] OR;  Service: Orthopedics;  Laterality: Left;  . PANCREATECTOMY    . TONSILLECTOMY    . TUBAL LIGATION      OB History    No data available       Home Medications    Prior to Admission medications   Medication Sig Start Date End Date Taking? Authorizing Provider  aspirin 81 MG  EC tablet Take 81 mg by mouth daily.      Historical Provider, MD  carbidopa-levodopa (SINEMET IR) 25-250 MG tablet Take 1 tablet by mouth 3 (three) times daily. 08/17/15   Tresa Garter, MD  cholecalciferol (VITAMIN D) 1000 UNITS tablet Take 2 tablets (2,000 Units total) by mouth daily. 03/15/15   Georgina Quint Plotnikov, MD  empagliflozin (JARDIANCE) 10 MG TABS tablet Take 10 mg by mouth daily. 01/04/16   Georgina Quint Plotnikov, MD  HYDROmorphone (DILAUDID) 2 MG tablet Take 2-3 tablets (4-6 mg total) by mouth every 6 (six) hours as needed for moderate pain or severe pain. 06/24/14   Teryl Lucy, MD  l-methylfolate-B6-B12 (METANX)  3-35-2 MG TABS Take 1 tablet by mouth 2 (two) times daily. 01/09/13   Georgina Quint Plotnikov, MD  LORazepam (ATIVAN) 2 MG tablet Take 0.5-1 tablets (1-2 mg total) by mouth 3 (three) times daily. 07/02/16   Georgina Quint Plotnikov, MD  metFORMIN (GLUCOPHAGE-XR) 500 MG 24 hr tablet TAKE 1 TABLET THREE TIMES A DAY 02/22/15   Tresa Garter, MD  metFORMIN (GLUCOPHAGE-XR) 500 MG 24 hr tablet TAKE 1 TABLET THREE TIMES A DAY 07/09/16   Tresa Garter, MD  mirtazapine (REMERON) 15 MG tablet TAKE 1 TABLET BY MOUTH AT BEDTIME AT 6-8 PM 05/01/16   Tresa Garter, MD  morphine (MS CONTIN) 15 MG 12 hr tablet Take 15 mg by mouth every 12 (twelve) hours. Reported on 12/28/2015    Historical Provider, MD  promethazine (PHENERGAN) 12.5 MG tablet Take 1 tablet (12.5 mg total) by mouth every 6 (six) hours as needed. for nausea 09/29/15   Tresa Garter, MD  promethazine (PHENERGAN) 12.5 MG tablet TAKE 1 TABLET BY MOUTH EVERY 6 HOURS AS NEEDED FOR NAUSEA 01/16/16   Tresa Garter, MD  sennosides-docusate sodium (SENOKOT-S) 8.6-50 MG tablet Take 2 tablets by mouth daily. 06/24/14   Teryl Lucy, MD  tolterodine (DETROL LA) 4 MG 24 hr capsule Take 4 mg by mouth at bedtime.    Historical Provider, MD  tolterodine (DETROL LA) 4 MG 24 hr capsule TAKE ONE CAPSULE AT BEDTIME 01/24/16   Georgina Quint Plotnikov, MD  Vitamin D, Ergocalciferol, (DRISDOL) 50000 units CAPS capsule TAKE 1 CAPSULE (50,000 UNITS TOTAL) BY MOUTH ONCE A WEEK. 03/28/16   Georgina Quint Plotnikov, MD  zolpidem (AMBIEN) 10 MG tablet Take 1 tablet (10 mg total) by mouth at bedtime as needed. for sleep 07/02/16   Georgina Quint Plotnikov, MD  zoster vaccine live, PF, (ZOSTAVAX) 16109 UNT/0.65ML injection Inject 19,400 Units into the skin once. 05/26/15   Tresa Garter, MD    Family History Family History  Problem Relation Age of Onset  . Heart disease Father 2    MI  . Hypertension Other     Social History Social History  Substance Use Topics  .  Smoking status: Current Every Day Smoker    Packs/day: 1.00    Years: 20.00    Types: Cigarettes  . Smokeless tobacco: Never Used  . Alcohol use 6.0 oz/week    10 Glasses of wine per week     Comment: 1 glass of wine daily     Allergies   Alendronate sodium; Asa [aspirin]; and Fluoxetine hcl   Review of Systems Review of Systems  Constitutional: Positive for activity change and fever.  HENT: Positive for congestion, postnasal drip, rhinorrhea and sore throat.   Respiratory: Positive for cough and shortness of breath.      Physical  Exam Triage Vital Signs ED Triage Vitals  Enc Vitals Group     BP      Pulse      Resp      Temp      Temp src      SpO2      Weight      Height      Head Circumference      Peak Flow      Pain Score      Pain Loc      Pain Edu?      Excl. in GC?    No data found.   Updated Vital Signs There were no vitals taken for this visit.  Visual Acuity Right Eye Distance:   Left Eye Distance:   Bilateral Distance:    Right Eye Near:   Left Eye Near:    Bilateral Near:     Physical Exam  HENT:  Mouth/Throat: Oropharyngeal exudate present.  Neck: Normal range of motion. Neck supple.  Cardiovascular: Normal rate, regular rhythm and normal heart sounds.   Pulmonary/Chest: She has rales.  Abdominal: Soft. Bowel sounds are normal.  Lymphadenopathy:    She has cervical adenopathy.  Neurological: She is alert.  Skin: Skin is warm and dry.  Nursing note and vitals reviewed.    UC Treatments / Results  Labs (all labs ordered are listed, but only abnormal results are displayed) Labs Reviewed - No data to display  EKG  EKG Interpretation None       Radiology No results found. X-rays reviewed and report per radiologist.  Procedures Procedures (including critical care time)  Medications Ordered in UC Medications - No data to display   Initial Impression / Assessment and Plan / UC Course  I have reviewed the triage  vital signs and the nursing notes.  Pertinent labs & imaging results that were available during my care of the patient were reviewed by me and considered in my medical decision making (see chart for details).  Clinical Course     Final Clinical Impressions(s) / UC Diagnoses   Final diagnoses:  None    New Prescriptions New Prescriptions   No medications on file     Linna Hoff, MD 07/17/16 1144

## 2016-07-18 ENCOUNTER — Emergency Department (HOSPITAL_COMMUNITY): Payer: Medicare Other

## 2016-07-18 ENCOUNTER — Inpatient Hospital Stay (HOSPITAL_COMMUNITY)
Admission: EM | Admit: 2016-07-18 | Discharge: 2016-07-22 | DRG: 871 | Disposition: A | Discharge status: Home Health | Payer: Medicare Other | Attending: Internal Medicine | Admitting: Internal Medicine

## 2016-07-18 ENCOUNTER — Telehealth: Payer: Self-pay | Admitting: Internal Medicine

## 2016-07-18 ENCOUNTER — Encounter (HOSPITAL_COMMUNITY): Payer: Self-pay | Admitting: Emergency Medicine

## 2016-07-18 DIAGNOSIS — Z79891 Long term (current) use of opiate analgesic: Secondary | ICD-10-CM | POA: Diagnosis not present

## 2016-07-18 DIAGNOSIS — R749 Abnormal serum enzyme level, unspecified: Secondary | ICD-10-CM | POA: Diagnosis not present

## 2016-07-18 DIAGNOSIS — J189 Pneumonia, unspecified organism: Secondary | ICD-10-CM

## 2016-07-18 DIAGNOSIS — Z7982 Long term (current) use of aspirin: Secondary | ICD-10-CM

## 2016-07-18 DIAGNOSIS — J440 Chronic obstructive pulmonary disease with acute lower respiratory infection: Secondary | ICD-10-CM | POA: Diagnosis present

## 2016-07-18 DIAGNOSIS — J181 Lobar pneumonia, unspecified organism: Secondary | ICD-10-CM | POA: Diagnosis present

## 2016-07-18 DIAGNOSIS — J040 Acute laryngitis: Secondary | ICD-10-CM | POA: Diagnosis present

## 2016-07-18 DIAGNOSIS — R0600 Dyspnea, unspecified: Secondary | ICD-10-CM

## 2016-07-18 DIAGNOSIS — I959 Hypotension, unspecified: Secondary | ICD-10-CM | POA: Diagnosis present

## 2016-07-18 DIAGNOSIS — J392 Other diseases of pharynx: Secondary | ICD-10-CM | POA: Diagnosis present

## 2016-07-18 DIAGNOSIS — I10 Essential (primary) hypertension: Secondary | ICD-10-CM | POA: Diagnosis present

## 2016-07-18 DIAGNOSIS — R739 Hyperglycemia, unspecified: Secondary | ICD-10-CM

## 2016-07-18 DIAGNOSIS — Z79899 Other long term (current) drug therapy: Secondary | ICD-10-CM

## 2016-07-18 DIAGNOSIS — E1165 Type 2 diabetes mellitus with hyperglycemia: Secondary | ICD-10-CM | POA: Diagnosis present

## 2016-07-18 DIAGNOSIS — Z993 Dependence on wheelchair: Secondary | ICD-10-CM

## 2016-07-18 DIAGNOSIS — Z8249 Family history of ischemic heart disease and other diseases of the circulatory system: Secondary | ICD-10-CM | POA: Diagnosis not present

## 2016-07-18 DIAGNOSIS — A419 Sepsis, unspecified organism: Secondary | ICD-10-CM | POA: Diagnosis not present

## 2016-07-18 DIAGNOSIS — R778 Other specified abnormalities of plasma proteins: Secondary | ICD-10-CM | POA: Diagnosis present

## 2016-07-18 DIAGNOSIS — R338 Other retention of urine: Secondary | ICD-10-CM | POA: Diagnosis not present

## 2016-07-18 DIAGNOSIS — F17210 Nicotine dependence, cigarettes, uncomplicated: Secondary | ICD-10-CM | POA: Diagnosis present

## 2016-07-18 DIAGNOSIS — J9601 Acute respiratory failure with hypoxia: Secondary | ICD-10-CM

## 2016-07-18 DIAGNOSIS — G40909 Epilepsy, unspecified, not intractable, without status epilepticus: Secondary | ICD-10-CM | POA: Diagnosis present

## 2016-07-18 DIAGNOSIS — R0602 Shortness of breath: Secondary | ICD-10-CM | POA: Diagnosis not present

## 2016-07-18 DIAGNOSIS — E785 Hyperlipidemia, unspecified: Secondary | ICD-10-CM | POA: Diagnosis present

## 2016-07-18 DIAGNOSIS — J158 Pneumonia due to other specified bacteria: Secondary | ICD-10-CM | POA: Diagnosis not present

## 2016-07-18 DIAGNOSIS — R509 Fever, unspecified: Secondary | ICD-10-CM

## 2016-07-18 DIAGNOSIS — F17200 Nicotine dependence, unspecified, uncomplicated: Secondary | ICD-10-CM | POA: Diagnosis present

## 2016-07-18 DIAGNOSIS — I69354 Hemiplegia and hemiparesis following cerebral infarction affecting left non-dominant side: Secondary | ICD-10-CM

## 2016-07-18 DIAGNOSIS — I69398 Other sequelae of cerebral infarction: Secondary | ICD-10-CM | POA: Diagnosis not present

## 2016-07-18 DIAGNOSIS — K210 Gastro-esophageal reflux disease with esophagitis: Secondary | ICD-10-CM | POA: Diagnosis present

## 2016-07-18 DIAGNOSIS — R1310 Dysphagia, unspecified: Secondary | ICD-10-CM | POA: Diagnosis present

## 2016-07-18 DIAGNOSIS — M545 Low back pain: Secondary | ICD-10-CM | POA: Diagnosis present

## 2016-07-18 DIAGNOSIS — J029 Acute pharyngitis, unspecified: Secondary | ICD-10-CM | POA: Diagnosis not present

## 2016-07-18 DIAGNOSIS — A403 Sepsis due to Streptococcus pneumoniae: Secondary | ICD-10-CM | POA: Diagnosis not present

## 2016-07-18 DIAGNOSIS — R7989 Other specified abnormal findings of blood chemistry: Secondary | ICD-10-CM

## 2016-07-18 DIAGNOSIS — R569 Unspecified convulsions: Secondary | ICD-10-CM

## 2016-07-18 DIAGNOSIS — B370 Candidal stomatitis: Secondary | ICD-10-CM | POA: Diagnosis present

## 2016-07-18 DIAGNOSIS — R269 Unspecified abnormalities of gait and mobility: Secondary | ICD-10-CM

## 2016-07-18 DIAGNOSIS — R748 Abnormal levels of other serum enzymes: Secondary | ICD-10-CM | POA: Diagnosis not present

## 2016-07-18 DIAGNOSIS — N281 Cyst of kidney, acquired: Secondary | ICD-10-CM | POA: Diagnosis not present

## 2016-07-18 DIAGNOSIS — Z7984 Long term (current) use of oral hypoglycemic drugs: Secondary | ICD-10-CM

## 2016-07-18 DIAGNOSIS — E119 Type 2 diabetes mellitus without complications: Secondary | ICD-10-CM

## 2016-07-18 LAB — CBC WITH DIFFERENTIAL/PLATELET
Basophils Absolute: 0 10*3/uL (ref 0.0–0.1)
Basophils Relative: 0 %
Eosinophils Absolute: 0 10*3/uL (ref 0.0–0.7)
Eosinophils Relative: 0 %
HCT: 44.6 % (ref 36.0–46.0)
Hemoglobin: 14.6 g/dL (ref 12.0–15.0)
Lymphocytes Relative: 4 %
Lymphs Abs: 1.1 10*3/uL (ref 0.7–4.0)
MCH: 29.8 pg (ref 26.0–34.0)
MCHC: 32.7 g/dL (ref 30.0–36.0)
MCV: 91 fL (ref 78.0–100.0)
Monocytes Absolute: 1.4 10*3/uL — ABNORMAL HIGH (ref 0.1–1.0)
Monocytes Relative: 5 %
Neutro Abs: 24.5 10*3/uL — ABNORMAL HIGH (ref 1.7–7.7)
Neutrophils Relative %: 91 %
Platelets: 230 10*3/uL (ref 150–400)
RBC: 4.9 MIL/uL (ref 3.87–5.11)
RDW: 14.8 % (ref 11.5–15.5)
WBC: 27 10*3/uL — ABNORMAL HIGH (ref 4.0–10.5)

## 2016-07-18 LAB — COMPREHENSIVE METABOLIC PANEL
ALT: 8 U/L — ABNORMAL LOW (ref 14–54)
AST: 18 U/L (ref 15–41)
Albumin: 3.9 g/dL (ref 3.5–5.0)
Alkaline Phosphatase: 113 U/L (ref 38–126)
Anion gap: 12 (ref 5–15)
BUN: 10 mg/dL (ref 6–20)
CO2: 26 mmol/L (ref 22–32)
Calcium: 8.5 mg/dL — ABNORMAL LOW (ref 8.9–10.3)
Chloride: 95 mmol/L — ABNORMAL LOW (ref 101–111)
Creatinine, Ser: 0.71 mg/dL (ref 0.44–1.00)
GFR calc Af Amer: >60 mL/min (ref >60)
GFR calc non Af Amer: >60 mL/min (ref >60)
Glucose, Bld: 236 mg/dL — ABNORMAL HIGH (ref 65–99)
Potassium: 4 mmol/L (ref 3.5–5.1)
Sodium: 133 mmol/L — ABNORMAL LOW (ref 135–145)
Total Bilirubin: 1.2 mg/dL (ref 0.3–1.2)
Total Protein: 7.2 g/dL (ref 6.5–8.1)

## 2016-07-18 LAB — GLUCOSE, CAPILLARY: Glucose-Capillary: 320 mg/dL — ABNORMAL HIGH (ref 65–99)

## 2016-07-18 LAB — BRAIN NATRIURETIC PEPTIDE: B Natriuretic Peptide: 333.6 pg/mL — ABNORMAL HIGH (ref 0.0–100.0)

## 2016-07-18 LAB — MRSA PCR SCREENING: MRSA by PCR: NEGATIVE

## 2016-07-18 LAB — LACTIC ACID, PLASMA
Lactic Acid, Venous: 1.3 mmol/L (ref 0.5–1.9)
Lactic Acid, Venous: 1 mmol/L (ref 0.5–1.9)

## 2016-07-18 LAB — TROPONIN I
Troponin I: 1.02 ng/mL (ref <0.03)
Troponin I: 0.92 ng/mL (ref <0.03)

## 2016-07-18 LAB — APTT: aPTT: 35 seconds (ref 24–36)

## 2016-07-18 LAB — I-STAT CG4 LACTIC ACID, ED: Lactic Acid, Venous: 1.93 mmol/L (ref 0.5–1.9)

## 2016-07-18 LAB — PROTIME-INR
INR: 1.1
Prothrombin Time: 14.2 seconds (ref 11.4–15.2)

## 2016-07-18 LAB — PROCALCITONIN: Procalcitonin: <0.1 ng/mL

## 2016-07-18 LAB — RAPID STREP SCREEN (MED CTR MEBANE ONLY): Streptococcus, Group A Screen (Direct): NEGATIVE

## 2016-07-18 MED ORDER — METHYLPREDNISOLONE SODIUM SUCC 40 MG IJ SOLR
40.0000 mg | Freq: Two times a day (BID) | INTRAMUSCULAR | Status: DC
  Administered 2016-07-19 – 2016-07-20 (×3): 40 mg via INTRAVENOUS
  Filled 2016-07-18 (×3): qty 1

## 2016-07-18 MED ORDER — SODIUM CHLORIDE 0.9 % IV BOLUS (SEPSIS)
1000.0000 mL | Freq: Once | INTRAVENOUS | Status: AC
  Administered 2016-07-18: 1000 mL via INTRAVENOUS

## 2016-07-18 MED ORDER — SODIUM CHLORIDE 0.9% FLUSH
3.0000 mL | Freq: Two times a day (BID) | INTRAVENOUS | Status: DC
Start: 2016-07-18 — End: 2016-07-22
  Administered 2016-07-18 – 2016-07-21 (×5): 3 mL via INTRAVENOUS

## 2016-07-18 MED ORDER — SODIUM CHLORIDE 0.9 % IV BOLUS (SEPSIS)
250.0000 mL | Freq: Once | INTRAVENOUS | Status: AC
  Administered 2016-07-18: 250 mL via INTRAVENOUS

## 2016-07-18 MED ORDER — ALBUTEROL SULFATE (2.5 MG/3ML) 0.083% IN NEBU
2.5000 mg | INHALATION_SOLUTION | RESPIRATORY_TRACT | Status: DC | PRN
Start: 2016-07-18 — End: 2016-07-22

## 2016-07-18 MED ORDER — DEXTROSE 5 % IV SOLN
500.0000 mg | INTRAVENOUS | Status: DC
  Administered 2016-07-19 – 2016-07-20 (×2): 500 mg via INTRAVENOUS
  Filled 2016-07-18 (×3): qty 500

## 2016-07-18 MED ORDER — IBUPROFEN 800 MG PO TABS
800.0000 mg | ORAL_TABLET | Freq: Once | ORAL | Status: AC
  Administered 2016-07-18: 800 mg via ORAL
  Filled 2016-07-18: qty 1

## 2016-07-18 MED ORDER — OXYCODONE HCL 5 MG PO TABS
5.0000 mg | ORAL_TABLET | Freq: Four times a day (QID) | ORAL | Status: DC | PRN
  Administered 2016-07-18 – 2016-07-22 (×2): 5 mg via ORAL
  Filled 2016-07-18 (×2): qty 1

## 2016-07-18 MED ORDER — SODIUM CHLORIDE 0.9 % IV SOLN
INTRAVENOUS | Status: DC
  Administered 2016-07-19 – 2016-07-20 (×2): via INTRAVENOUS

## 2016-07-18 MED ORDER — VITAMIN D3 25 MCG (1000 UNIT) PO TABS
2000.0000 [IU] | ORAL_TABLET | Freq: Every day | ORAL | Status: DC
  Administered 2016-07-18 – 2016-07-22 (×5): 2000 [IU] via ORAL
  Filled 2016-07-18 (×5): qty 2

## 2016-07-18 MED ORDER — DEXTROSE 5 % IV SOLN
500.0000 mg | Freq: Once | INTRAVENOUS | Status: AC
  Administered 2016-07-18: 500 mg via INTRAVENOUS
  Filled 2016-07-18: qty 500

## 2016-07-18 MED ORDER — INSULIN ASPART 100 UNIT/ML ~~LOC~~ SOLN
0.0000 [IU] | Freq: Three times a day (TID) | SUBCUTANEOUS | Status: DC
  Administered 2016-07-19: 3 [IU] via SUBCUTANEOUS
  Administered 2016-07-19: 8 [IU] via SUBCUTANEOUS
  Administered 2016-07-20 (×2): 3 [IU] via SUBCUTANEOUS
  Administered 2016-07-20: 5 [IU] via SUBCUTANEOUS
  Administered 2016-07-21: 2 [IU] via SUBCUTANEOUS

## 2016-07-18 MED ORDER — FLUCONAZOLE IN SODIUM CHLORIDE 100-0.9 MG/50ML-% IV SOLN
100.0000 mg | INTRAVENOUS | Status: DC
  Administered 2016-07-18 – 2016-07-20 (×3): 100 mg via INTRAVENOUS
  Filled 2016-07-18 (×4): qty 50

## 2016-07-18 MED ORDER — DEXTROSE 5 % IV SOLN
1.0000 g | INTRAVENOUS | Status: DC
  Administered 2016-07-19 – 2016-07-20 (×2): 1 g via INTRAVENOUS
  Filled 2016-07-18 (×3): qty 10

## 2016-07-18 MED ORDER — CARBIDOPA-LEVODOPA 25-250 MG PO TABS
1.0000 | ORAL_TABLET | Freq: Three times a day (TID) | ORAL | Status: DC
  Administered 2016-07-18 – 2016-07-22 (×11): 1 via ORAL
  Filled 2016-07-18 (×13): qty 1

## 2016-07-18 MED ORDER — HYDROMORPHONE HCL 2 MG PO TABS: 2.0000 mg | ORAL_TABLET | Freq: Three times a day (TID) | ORAL | Status: DC | PRN

## 2016-07-18 MED ORDER — LORAZEPAM 1 MG PO TABS
1.0000 mg | ORAL_TABLET | Freq: Three times a day (TID) | ORAL | Status: DC
  Administered 2016-07-19 (×2): 2 mg via ORAL
  Administered 2016-07-19: 1 mg via ORAL
  Administered 2016-07-19 – 2016-07-20 (×3): 2 mg via ORAL
  Administered 2016-07-20: 1 mg via ORAL
  Administered 2016-07-21 (×3): 2 mg via ORAL
  Administered 2016-07-22: 1 mg via ORAL
  Filled 2016-07-18: qty 2
  Filled 2016-07-18 (×2): qty 1
  Filled 2016-07-18 (×7): qty 2
  Filled 2016-07-18: qty 1

## 2016-07-18 MED ORDER — ACETAMINOPHEN 500 MG PO TABS
1000.0000 mg | ORAL_TABLET | Freq: Once | ORAL | Status: AC
  Administered 2016-07-18: 1000 mg via ORAL
  Filled 2016-07-18: qty 2

## 2016-07-18 MED ORDER — ZOLPIDEM TARTRATE 5 MG PO TABS: 5.0000 mg | ORAL_TABLET | Freq: Every evening | ORAL | Status: DC | PRN

## 2016-07-18 MED ORDER — ASPIRIN 81 MG PO CHEW
81.0000 mg | CHEWABLE_TABLET | Freq: Every day | ORAL | Status: DC
  Administered 2016-07-19 – 2016-07-22 (×4): 81 mg via ORAL
  Filled 2016-07-18 (×4): qty 1

## 2016-07-18 MED ORDER — INSULIN ASPART 100 UNIT/ML ~~LOC~~ SOLN
0.0000 [IU] | Freq: Every day | SUBCUTANEOUS | Status: DC
Start: 2016-07-18 — End: 2016-07-22
  Administered 2016-07-18: 4 [IU] via SUBCUTANEOUS
  Administered 2016-07-19: 2 [IU] via SUBCUTANEOUS

## 2016-07-18 MED ORDER — ENOXAPARIN SODIUM 40 MG/0.4ML ~~LOC~~ SOLN
40.0000 mg | SUBCUTANEOUS | Status: DC
Start: 2016-07-18 — End: 2016-07-22
  Administered 2016-07-18 – 2016-07-21 (×4): 40 mg via SUBCUTANEOUS
  Filled 2016-07-18 (×4): qty 0.4

## 2016-07-18 MED ORDER — IOPAMIDOL (ISOVUE-300) INJECTION 61%
75.0000 mL | Freq: Once | INTRAVENOUS | Status: AC | PRN
  Administered 2016-07-18: 75 mL via INTRAVENOUS

## 2016-07-18 MED ORDER — DEXTROSE 5 % IV SOLN
1.0000 g | Freq: Once | INTRAVENOUS | Status: AC
  Administered 2016-07-18: 1 g via INTRAVENOUS
  Filled 2016-07-18: qty 10

## 2016-07-18 MED ORDER — FESOTERODINE FUMARATE ER 8 MG PO TB24
8.0000 mg | ORAL_TABLET | Freq: Every day | ORAL | Status: DC
Start: 2016-07-18 — End: 2016-07-22
  Administered 2016-07-18 – 2016-07-22 (×5): 8 mg via ORAL
  Filled 2016-07-18 (×5): qty 1

## 2016-07-18 MED ORDER — ASPIRIN 300 MG RE SUPP: 300.0000 mg | Freq: Once | RECTAL | Status: DC

## 2016-07-18 MED ORDER — SODIUM CHLORIDE 0.9 % IV BOLUS (SEPSIS)
250.0000 mL | Freq: Once | INTRAVENOUS | Status: AC
  Administered 2016-07-19: 250 mL via INTRAVENOUS

## 2016-07-18 MED ORDER — METHYLPREDNISOLONE SODIUM SUCC 125 MG IJ SOLR
125.0000 mg | Freq: Once | INTRAMUSCULAR | Status: AC
  Administered 2016-07-18: 125 mg via INTRAVENOUS
  Filled 2016-07-18: qty 2

## 2016-07-18 MED ORDER — VITAMIN D (ERGOCALCIFEROL) 1.25 MG (50000 UNIT) PO CAPS: 50000.0000 [IU] | ORAL_CAPSULE | ORAL | Status: DC

## 2016-07-18 NOTE — ED Triage Notes (Signed)
Patient brought in due to increased oral swelling since yesterday and having trouble eating due to swelling.  Patient also hasnt urinated since during the night.  Patient was seen yesterday at Urgent care and diagnosed with URI and put on levofloxacin and had 2 doses. Per caregiver patient also had increased SOB.

## 2016-07-18 NOTE — ED Provider Notes (Signed)
[facility]-EMERGENCY DEPT Provider Note   CSN: [id number] Arrival date & time: 07/18/16  1242     History   Chief Complaint Chief Complaint  Patient presents with  . Oral Swelling  . Urinary Retention    HPI [name] is a 65 y.o. female.  Pt reports to the ED today with sob and increase in throat swelling.  The pt also has not urinated since last night.  The pt went to urgent care yesterday and was told that she had an URI.  She was placed on Levaquin.  The pt is getting more SOB and the swelling in her neck is getting worse.  The pt did take a dose of Levaquin yesterday, but said the throat swelling was there prior to taking it and she does not think it is an allergy.  Pt said that she's been unable to eat or to drink.  Pt did have a fever starting yesterday.       Past Medical History:  Diagnosis Date  . Anoxic brain injury (HCC)   . Anxiety   . Chronic neck pain   . Chronic pain in shoulder   . COPD (chronic obstructive pulmonary disease) (HCC)   . Depression   . Fracture of left humerus 06/24/2014  . Gait disorder   . GERD (gastroesophageal reflux disease)   . Hernia of abdominal cavity   . 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 Hyperlipidemia   . LBP (low back pain)   . Neuropathy (HCC)   . Osteoarthritis   . Seizure disorder (HCC)   . Seizures (HCC)   . Type II or unspecified type diabetes mellitus without mention of complication, not stated as uncontrolled 2010  . Vitamin B 12 deficiency 2011    Patient Active Problem List   Diagnosis Date Noted  . Sepsis (HCC) 07/18/2016  . Anoxic brain injury (HCC) 12/29/2015  . Gait disorder 12/28/2015  . Well adult exam 12/08/2014  . Fracture of left humerus 06/24/2014  . Humerus fracture 06/24/2014  . Chest wall contusion 05/03/2014  . Acute bronchitis 11/10/2013  . Left elbow pain 11/03/2013  . Right facial pain 06/19/2013  . Ankle ulcer (HCC) 04/02/2012  . Nausea & vomiting  10/05/2011  . Decubitus ulcer 06/26/2011  . Decubitus ulcer of coccyx 05/21/2011  . Cellulitis and abscess of other specified site 10/11/2010  . B12 deficiency 04/04/2010  . HYPERLIPIDEMIA 04/04/2010  . Anxiety state 11/14/2009  . Vitamin D deficiency 10/21/2009  . MELENA 10/21/2009  . DM2 (diabetes mellitus, type 2) (HCC) 07/07/2009  . FEVER, RECURRENT 06/21/2009  . COPD 02/25/2009  . OSTEOARTHRITIS 02/25/2009  . LOW BACK PAIN 02/25/2009  . TOBACCO USER 01/13/2009  . Rash and other nonspecific skin eruption 01/13/2009  . FURUNCLE 06/08/2008  . DEPRESSION 02/26/2007  . PAIN, CHRONIC NEC 02/26/2007  . VENOUS INSUFFICIENCY, LEGS 02/26/2007  . GERD 02/26/2007  . HERNIA, INCISIONAL 02/26/2007  . SHOULDER PAIN, LEFT 02/26/2007  . SEIZURE DISORDER 02/26/2007  . INSOMNIA 02/26/2007  . Abnormality of gait 02/26/2007  . LEG EDEMA, CHRONIC 02/26/2007    Past Surgical History:  Procedure Laterality Date  . COLONOSCOPY    . DIAGNOSTIC LAPAROSCOPY     PMH: Exploratory lap  . HERNIA REPAIR     incisional  . ORIF HUMERUS FRACTURE Left 06/24/2014   Procedure: LEFT OPEN REDUCTION INTERNAL FIXATION (ORIF) PROXIMAL HUMERUS FRACTURE;  Surgeon: Eulas Post, MD;  Location: [facility] OR;  Service: Orthopedics;  Laterality: Left;  . PANCREATECTOMY    . TONSILLECTOMY    . TUBAL LIGATION      OB History    No data available       Home Medications    Prior to Admission medications   Medication Sig Start Date End Date Taking? Authorizing Provider  aspirin 81 MG EC tablet Take 81 mg by mouth daily.     Yes Historical Provider, MD  carbidopa-levodopa (SINEMET IR) 25-250 MG tablet Take 1 tablet by mouth 3 (three) times daily. 08/17/15  Yes Tresa Garter, MD  cholecalciferol (VITAMIN D) 1000 UNITS tablet Take 2 tablets (2,000 Units total) by mouth daily. 03/15/15  Yes Georgina Quint Plotnikov, MD  HYDROmorphone (DILAUDID) 2 MG tablet Take 2-3 tablets (4-6 mg total) by mouth every 6 (six) hours as  needed for moderate pain or severe pain. Patient taking differently: Take 2 mg by mouth every 8 (eight) hours as needed for moderate pain or severe pain.  06/24/14  Yes Teryl Lucy, MD  levofloxacin (LEVAQUIN) 500 MG tablet Take 1 tablet (500 mg total) by mouth daily. 07/17/16  Yes Linna Hoff, MD  LORazepam (ATIVAN) 2 MG tablet Take 0.5-1 tablets (1-2 mg total) by mouth 3 (three) times daily. Patient taking differently: Take 2 mg by mouth 3 (three) times daily.  07/02/16  Yes Georgina Quint Plotnikov, MD  metFORMIN (GLUCOPHAGE-XR) 500 MG 24 hr tablet TAKE 1 TABLET THREE TIMES A DAY 02/22/15  Yes Georgina Quint Plotnikov, MD  promethazine (PHENERGAN) 12.5 MG tablet Take 1 tablet (12.5 mg total) by mouth every 6 (six) hours as needed. for nausea 09/29/15  Yes Georgina Quint Plotnikov, MD  tolterodine (DETROL LA) 4 MG 24 hr capsule TAKE ONE CAPSULE AT BEDTIME 01/24/16  Yes Georgina Quint Plotnikov, MD  Vitamin D, Ergocalciferol, (DRISDOL) 50000 units CAPS capsule TAKE 1 CAPSULE (50,000 UNITS TOTAL) BY MOUTH ONCE A WEEK. 03/28/16  Yes Georgina Quint Plotnikov, MD  zolpidem (AMBIEN) 10 MG tablet Take 1 tablet (10 mg total) by mouth at bedtime as needed. for sleep Patient taking differently: Take 10 mg by mouth at bedtime. for sleep 07/02/16  Yes Georgina Quint Plotnikov, MD  mirtazapine (REMERON) 15 MG tablet TAKE 1 TABLET BY MOUTH AT BEDTIME AT 6-8 PM Patient not taking: Reported on 07/18/2016 05/01/16   Georgina Quint Plotnikov, MD  zoster vaccine live, PF, (ZOSTAVAX) 16109 UNT/0.65ML injection Inject 19,400 Units into the skin once. 05/26/15   Tresa Garter, MD    Family History Family History  Problem Relation Age of Onset  . Heart disease Father 34    MI  . Hypertension Other     Social History Social History  Substance Use Topics  . Smoking status: Current Every Day Smoker    Packs/day: 1.00    Years: 20.00    Types: Cigarettes  . Smokeless tobacco: Never Used  . Alcohol use 6.0 oz/week    10 Glasses of wine per  week     Comment: 1 glass of wine daily     Allergies   Alendronate sodium; Asa [aspirin]; and Fluoxetine hcl   Review of Systems Review of Systems  Constitutional: Positive for fatigue.  HENT: Positive for sore throat and trouble swallowing.   Respiratory: Positive for cough and shortness of breath.      Physical Exam Updated Vital Signs BP 97/72   Pulse 99   Temp 101.7 F (38.7 C) (Oral)   Resp 13   Ht 5\' 4"  (1.626 m)  Wt 130 lb (59 kg)   SpO2 94%   BMI 22.31 kg/m   Physical Exam  Constitutional: She is oriented to person, place, and time. She appears well-developed. She appears distressed.  HENT:  Head: Normocephalic and atraumatic.  Right Ear: External ear normal.  Left Ear: External ear normal.  Nose: Nose normal.  Mouth/Throat: Oropharyngeal exudate present.  Pt's tongue is coated with a thick, yellow substance.  Eyes: Conjunctivae and EOM are normal. Pupils are equal, round, and reactive to light.  Cardiovascular: Regular rhythm, normal heart sounds and intact distal pulses.  Tachycardia present.   Pulmonary/Chest: She has wheezes. She has rhonchi. She has rales.  Abdominal: Soft. Bowel sounds are normal.  Musculoskeletal: Normal range of motion.  Lymphadenopathy:    She has cervical adenopathy.  Neurological: She is alert and oriented to person, place, and time.  Skin: Skin is warm.  Psychiatric: She has a normal mood and affect. Her behavior is normal. Judgment and thought content normal.  Nursing note and vitals reviewed.    ED Treatments / Results  Labs (all labs ordered are listed, but only abnormal results are displayed) Labs Reviewed  COMPREHENSIVE METABOLIC PANEL - Abnormal; Notable for the following:       Result Value   Sodium 133 (*)    Chloride 95 (*)    Glucose, Bld 236 (*)    Calcium 8.5 (*)    ALT 8 (*)    All other components within normal limits  CBC WITH DIFFERENTIAL/PLATELET - Abnormal; Notable for the following:    WBC 27.0  (*)    Neutro Abs 24.5 (*)    Monocytes Absolute 1.4 (*)    All other components within normal limits  TROPONIN I - Abnormal; Notable for the following:    Troponin I 1.02 (*)    All other components within normal limits  I-STAT CG4 LACTIC ACID, ED - Abnormal; Notable for the following:    Lactic Acid, Venous 1.93 (*)    All other components within normal limits  CULTURE, BLOOD (ROUTINE X 2)  CULTURE, BLOOD (ROUTINE X 2)  URINE CULTURE  URINALYSIS, ROUTINE W REFLEX MICROSCOPIC (NOT AT [facility])  TROPONIN I  I-STAT CG4 LACTIC ACID, ED    EKG  EKG Interpretation  Date/Time:  Wednesday July 18 2016 13:10:19 EDT Ventricular Rate:  115 PR Interval:    QRS Duration: 72 QT Interval:  325 QTC Calculation: 450 R Axis:   58 Text Interpretation:  Sinus tachycardia Low voltage, extremity leads Nonspecific repol abnormality, lateral leads since last tracing no significant change Confirmed by Effie Shy  MD, Mechele Collin ([id number]) on 07/18/2016 1:31:28 PM Also confirmed by Effie Shy  MD, ELLIOTT [phone]), editor Whitney Post, Cala Bradford [phone])  on 07/18/2016 1:52:48 PM       Radiology Dg Chest 2 View  Result Date: 07/17/2016 CLINICAL DATA:  Cough, fever and sore throat. EXAM: CHEST  2 VIEW COMPARISON:  06/24/2014 chest radiograph FINDINGS: The cardiomediastinal silhouette is unremarkable. Subsegmental right lower lung atelectasis noted. There is no evidence of focal airspace disease, pulmonary edema, suspicious pulmonary nodule/mass, pleural effusion, or pneumothorax. No acute bony abnormalities are identified. IMPRESSION: Subsegmental right lower lung atelectasis. No evidence of focal airspace disease/ pneumonia. Electronically Signed   By: Harmon Pier M.D.   On: 07/17/2016 11:33   Ct Soft Tissue Neck W Contrast  Result Date: 07/18/2016 CLINICAL DATA:  65 year old female with increased shortness of breath cough and oral swelling since yesterday. Diagnosed with URI. Initial encounter. EXAM: CT  NECK WITH CONTRAST  TECHNIQUE: Multidetector CT imaging of the neck was performed using the standard protocol following the bolus administration of intravenous contrast. CONTRAST:  75mL ISOVUE-300 IOPAMIDOL (ISOVUE-300) INJECTION 61% COMPARISON:  Brain MRI 03/30/2011. FINDINGS: Pharynx and larynx: Generalized pharyngeal mucosal space thickening involving the oropharynx, hypopharynx, and supraglottic larynx. There is mild mucosal hyper enhancement of the nasopharynx which otherwise appears normal. There is mild thickening of the epiglottis (series 7, image 54). Up to mild associated soft tissue stranding in both parapharyngeal spaces. Trace retropharyngeal effusion. There also appears to be mucosal hyper enhancement of the proximal cervical esophagus (sagittal image 45). The level of the glottis and subglottic trachea appear normal. Salivary glands: Negative sublingual space. Negative submandibular glands and parotid glands. Thyroid: Negative. Lymph nodes: Hyperenhancing bilateral cervical lymph nodes. Bilateral level 2 lymph nodes are mildly enlarged up to 11 mm short axis. Bilateral level 1 B nodes appear heterogeneous and enlarged up to 10 mm short axis. No cystic or necrotic nodes are identified. Tiny retropharyngeal nodes also are hyper enhancing. Vascular: Major vascular structures in the neck and at the skullbase are patent. Carotid atherosclerosis in the neck without stenosis. Limited intracranial: Negative. Visualized orbits: Negative. Mastoids and visualized paranasal sinuses: Visualized paranasal sinuses and mastoids are stable and well pneumatized. Skeleton: Dental periapical lucency at the posterior right mandible molar. No other acute dental findings. Average for age cervical spine degeneration. No acute osseous abnormality identified. Upper chest: Reported separately today.  Lung apices are clear. Other: None. IMPRESSION: 1. Generalized pharyngitis and laryngitis with pharyngeal mucosal space edema including mild  involvement of the epiglottis. Mild parapharyngeal and retropharyngeal inflammation. Mucosal hyper enhancement at the adenoids and also in the cervical esophagus - consider upper esophagitis. 2. Reactive appearing hyper enhancing cervical lymph nodes diffusely, maximal at the level 1 and level 2 stations. 3. No cystic or necrotic nodes.  No neck abscess. 4. Chest CT today reported separately. Electronically Signed   By: Odessa FlemingH  Hall M.D.   On: 07/18/2016 15:23   Ct Chest W Contrast  Result Date: 07/18/2016 CLINICAL DATA:  Upper respiratory infection diagnosed yesterday. Worsening swelling and difficulty eating. Shortness of breath and cough. EXAM: CT CHEST WITH CONTRAST TECHNIQUE: Multidetector CT imaging of the chest was performed during intravenous contrast administration. CONTRAST:  75mL ISOVUE-300 IOPAMIDOL (ISOVUE-300) INJECTION 61% COMPARISON:  Chest radiography 07/17/2016 FINDINGS: Cardiovascular: There is aortic atherosclerosis. No aneurysm or dissection. Heart size is normal. No pericardial fluid. The study was not done as a pulmonary arteriogram study, but no pulmonary emboli are identified. Mediastinum/Nodes: No pathologically enlarged hilar or mediastinal lymph nodes. No esophageal mass. There could be wall thickening of the upper thoracic esophagus as might be seen with esophagitis. Lungs/Pleura: Background pattern of mild emphysema. There is patchy infiltrate in the posterior left lower lobe consistent with mild bronchopneumonia. There is more extensive patchy density in volume loss in the right lower lobe and minimal involvement of the right middle lobe consistent with bronchopneumonia. No pleural effusion. Upper Abdomen: No acute finding. Musculoskeletal: Bilateral breast implants.  No spinal abnormality. IMPRESSION: Patchy pulmonary density consistent with bronchopneumonia, most pronounced in the right lower lobe but also affecting the left lower lobe and to a minimal extent the right middle lobe.  No pleural effusion.  No lymphadenopathy. Question upper thoracic esophageal wall thickening that could go along with esophagitis. This is not definite. Electronically Signed   By: Paulina FusiMark  Shogry M.D.   On: 07/18/2016 15:20    Procedures .Critical Care  Performed by: Jacalyn Lefevre Authorized by: Jacalyn Lefevre   Critical care provider statement:    Critical care time (minutes):  30   Critical care time was exclusive of:  Separately billable procedures and treating other patients   Critical care was necessary to treat or prevent imminent or life-threatening deterioration of the following conditions:  Respiratory failure   Critical care was time spent personally by me on the following activities:  Development of treatment plan with patient or surrogate, discussions with consultants, evaluation of patient's response to treatment, examination of patient, ordering and performing treatments and interventions, ordering and review of laboratory studies, ordering and review of radiographic studies, pulse oximetry, re-evaluation of patient's condition and review of old charts    (including critical care time)  Medications Ordered in ED Medications  sodium chloride 0.9 % bolus 1,000 mL (not administered)  azithromycin (ZITHROMAX) 500 mg in dextrose 5 % 250 mL IVPB (not administered)  methylPREDNISolone sodium succinate (SOLU-MEDROL) 125 mg/2 mL injection 125 mg (not administered)  aspirin suppository 300 mg (not administered)  sodium chloride 0.9 % bolus 1,000 mL (1,000 mLs Intravenous New Bag/Given 07/18/16 1511)  acetaminophen (TYLENOL) tablet 1,000 mg (1,000 mg Oral Given 07/18/16 1507)  iopamidol (ISOVUE-300) 61 % injection 75 mL (75 mLs Intravenous Contrast Given 07/18/16 1449)  cefTRIAXone (ROCEPHIN) 1 g in dextrose 5 % 50 mL IVPB (1 g Intravenous New Bag/Given 07/18/16 1533)  sodium chloride 0.9 % bolus 1,000 mL (1,000 mLs Intravenous New Bag/Given 07/18/16 1553)  ibuprofen (ADVIL,MOTRIN)  tablet 800 mg (800 mg Oral Given 07/18/16 1603)     Initial Impression / Assessment and Plan / ED Course  I have reviewed the triage vital signs and the nursing notes.  Pertinent labs & imaging results that were available during my care of the patient were reviewed by me and considered in my medical decision making (see chart for details).  Clinical Course   Pt d/w ENT on call.  He looked at the scan and was not worried about the epiglottis.  Airway looked good on scan.  Clinically, pt's airway is fine.  She looks much better after IVFs and oxygen.  Pt will be d/w hospitalists for admission.  Pt d/w Dr. Gonzella Lex ([facility]) who will admit her.  He asked that I speak with cardiology due to the elevated troponin.  Pt d/w Dr. Algie Coffer (cardiology) who will come by and see her.   Final Clinical Impressions(s) / ED Diagnoses   Final diagnoses:  Pharyngitis, unspecified etiology  Fever, unspecified fever cause  Community acquired pneumonia, unspecified laterality  Hyperglycemia  Acute respiratory failure with hypoxia (HCC)  Elevated troponin  Sepsis, due to unspecified organism [facility])    New Prescriptions New Prescriptions   No medications on file     Jacalyn Lefevre, MD 07/18/16 1636

## 2016-07-18 NOTE — Consult Note (Signed)
Referring Physician:  ELSEY HOLTS is an 65 y.o. female.                       Chief Complaint: Abnormal Troponin-I.  HPI: 65 year old female is admitted for Sepsis, acute pharyngitis with pharyngeal edema, bilateral pneumonia, possible esophageal candidiasis/esophagitis. She has elevated troponin-I without chest pain or shortness of breath or leg edema. Her PMH include COPD, GERD, Hyperlipidemia, tobacco use disorder, seizure disorder and type II DM, II. EKG showed sinus tachycardia, low voltage and non-specific ST depression in lateral leads.   Past Medical History:  Diagnosis Date  . Anoxic brain injury ([place])   . Anxiety   . Chronic neck pain   . Chronic pain in shoulder   . COPD (chronic obstructive pulmonary disease) ([place])   . Depression   . Fracture of left humerus 06/24/2014  . Gait disorder   . GERD (gastroesophageal reflux disease)   . Hernia of abdominal cavity   . 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 Hyperlipidemia   . LBP (low back pain)   . Neuropathy ([place])   . Osteoarthritis   . Seizure disorder ([place])   . Seizures ([place])   . Type II or unspecified type diabetes mellitus without mention of complication, not stated as uncontrolled 2010  . Vitamin B 12 deficiency 2011      Past Surgical History:  Procedure Laterality Date  . COLONOSCOPY    . DIAGNOSTIC LAPAROSCOPY     PMH: Exploratory lap  . HERNIA REPAIR     incisional  . ORIF HUMERUS FRACTURE Left 06/24/2014   Procedure: LEFT OPEN REDUCTION INTERNAL FIXATION (ORIF) PROXIMAL HUMERUS FRACTURE;  Surgeon: Johnny Bridge, MD;  Location: [place];  Service: Orthopedics;  Laterality: Left;  . PANCREATECTOMY    . TONSILLECTOMY    . TUBAL LIGATION      Family History  Problem Relation Age of Onset  . Heart disease Father 68    MI  . Hypertension Other    Social History:  reports that she has been smoking Cigarettes.  She has a 20.00 pack-year smoking history. She has never used  smokeless tobacco. She reports that she drinks about 6.0 oz of alcohol per week . She reports that she does not use drugs.  Allergies:  Allergies  Allergen Reactions  . Alendronate Sodium   . Asa [Aspirin] Other (See Comments)    "stomach burning", "I can take baby aspirin"  . Fluoxetine Hcl      (Not in a hospital admission)  Results for orders placed or performed during the hospital encounter of 07/18/16 (from the past 48 hour(s))  Comprehensive metabolic panel     Status: Abnormal   Collection Time: 07/18/16  1:19 PM  Result Value Ref Range   Sodium 133 (L) 135 - 145 mmol/L   Potassium 4.0 3.5 - 5.1 mmol/L   Chloride 95 (L) 101 - 111 mmol/L   CO2 26 22 - 32 mmol/L   Glucose, Bld 236 (H) 65 - 99 mg/dL   BUN 10 6 - 20 mg/dL   Creatinine, Ser 0.71 0.44 - 1.00 mg/dL   Calcium 8.5 (L) 8.9 - 10.3 mg/dL   Total Protein 7.2 6.5 - 8.1 g/dL   Albumin 3.9 3.5 - 5.0 g/dL   AST 18 15 - 41 U/L   ALT 8 (L) 14 - 54 U/L   Alkaline Phosphatase 113 38 - 126 U/L  Total Bilirubin 1.2 0.3 - 1.2 mg/dL   GFR calc non Af Amer >60 >60 mL/min   GFR calc Af Amer >60 >60 mL/min    Comment: (NOTE) The eGFR has been calculated using the CKD EPI equation. This calculation has not been validated in all clinical situations. eGFR's persistently <60 mL/min signify possible Chronic Kidney Disease.    Anion gap 12 5 - 15  CBC WITH DIFFERENTIAL     Status: Abnormal   Collection Time: 07/18/16  1:19 PM  Result Value Ref Range   WBC 27.0 (H) 4.0 - 10.5 K/uL   RBC 4.90 3.87 - 5.11 MIL/uL   Hemoglobin 14.6 12.0 - 15.0 g/dL   HCT 44.6 36.0 - 46.0 %   MCV 91.0 78.0 - 100.0 fL   MCH 29.8 26.0 - 34.0 pg   MCHC 32.7 30.0 - 36.0 g/dL   RDW 14.8 11.5 - 15.5 %   Platelets 230 150 - 400 K/uL   Neutrophils Relative % 91 %   Lymphocytes Relative 4 %   Monocytes Relative 5 %   Eosinophils Relative 0 %   Basophils Relative 0 %   Neutro Abs 24.5 (H) 1.7 - 7.7 K/uL   Lymphs Abs 1.1 0.7 - 4.0 K/uL   Monocytes  Absolute 1.4 (H) 0.1 - 1.0 K/uL   Eosinophils Absolute 0.0 0.0 - 0.7 K/uL   Basophils Absolute 0.0 0.0 - 0.1 K/uL   WBC Morphology WHITE COUNT CONFIRMED ON SMEAR   Troponin I     Status: Abnormal   Collection Time: 07/18/16  1:19 PM  Result Value Ref Range   Troponin I 1.02 (HH) <0.03 ng/mL    Comment: REPEATED TO VERIFY CRITICAL RESULT CALLED TO, READ BACK BY AND VERIFIED WITH: BROWN,L. AT 14:26 10.18.17 BY THOMPSON,N.   I-Stat CG4 Lactic Acid, ED  (not at  [facility])     Status: Abnormal   Collection Time: 07/18/16  1:44 PM  Result Value Ref Range   Lactic Acid, Venous 1.93 (HH) 0.5 - 1.9 mmol/L   Dg Chest 2 View  Result Date: 07/17/2016 CLINICAL DATA:  Cough, fever and sore throat. EXAM: CHEST  2 VIEW COMPARISON:  06/24/2014 chest radiograph FINDINGS: The cardiomediastinal silhouette is unremarkable. Subsegmental right lower lung atelectasis noted. There is no evidence of focal airspace disease, pulmonary edema, suspicious pulmonary nodule/mass, pleural effusion, or pneumothorax. No acute bony abnormalities are identified. IMPRESSION: Subsegmental right lower lung atelectasis. No evidence of focal airspace disease/ pneumonia. Electronically Signed   By: Margarette Canada M.D.   On: 07/17/2016 11:33   Ct Soft Tissue Neck W Contrast  Result Date: 07/18/2016 CLINICAL DATA:  65 year old female with increased shortness of breath cough and oral swelling since yesterday. Diagnosed with URI. Initial encounter. EXAM: CT NECK WITH CONTRAST TECHNIQUE: Multidetector CT imaging of the neck was performed using the standard protocol following the bolus administration of intravenous contrast. CONTRAST:  16m ISOVUE-300 IOPAMIDOL (ISOVUE-300) INJECTION 61% COMPARISON:  Brain MRI 03/30/2011. FINDINGS: Pharynx and larynx: Generalized pharyngeal mucosal space thickening involving the oropharynx, hypopharynx, and supraglottic larynx. There is mild mucosal hyper enhancement of the nasopharynx which otherwise appears  normal. There is mild thickening of the epiglottis (series 7, image 54). Up to mild associated soft tissue stranding in both parapharyngeal spaces. Trace retropharyngeal effusion. There also appears to be mucosal hyper enhancement of the proximal cervical esophagus (sagittal image 45). The level of the glottis and subglottic trachea appear normal. Salivary glands: Negative sublingual space. Negative submandibular glands and  parotid glands. Thyroid: Negative. Lymph nodes: Hyperenhancing bilateral cervical lymph nodes. Bilateral level 2 lymph nodes are mildly enlarged up to 11 mm short axis. Bilateral level 1 B nodes appear heterogeneous and enlarged up to 10 mm short axis. No cystic or necrotic nodes are identified. Tiny retropharyngeal nodes also are hyper enhancing. Vascular: Major vascular structures in the neck and at the skullbase are patent. Carotid atherosclerosis in the neck without stenosis. Limited intracranial: Negative. Visualized orbits: Negative. Mastoids and visualized paranasal sinuses: Visualized paranasal sinuses and mastoids are stable and well pneumatized. Skeleton: Dental periapical lucency at the posterior right mandible molar. No other acute dental findings. Average for age cervical spine degeneration. No acute osseous abnormality identified. Upper chest: Reported separately today.  Lung apices are clear. Other: None. IMPRESSION: 1. Generalized pharyngitis and laryngitis with pharyngeal mucosal space edema including mild involvement of the epiglottis. Mild parapharyngeal and retropharyngeal inflammation. Mucosal hyper enhancement at the adenoids and also in the cervical esophagus - consider upper esophagitis. 2. Reactive appearing hyper enhancing cervical lymph nodes diffusely, maximal at the level 1 and level 2 stations. 3. No cystic or necrotic nodes.  No neck abscess. 4. Chest CT today reported separately. Electronically Signed   By: Genevie Ann M.D.   On: 07/18/2016 15:23   Ct Chest W  Contrast  Result Date: 07/18/2016 CLINICAL DATA:  Upper respiratory infection diagnosed yesterday. Worsening swelling and difficulty eating. Shortness of breath and cough. EXAM: CT CHEST WITH CONTRAST TECHNIQUE: Multidetector CT imaging of the chest was performed during intravenous contrast administration. CONTRAST:  30m ISOVUE-300 IOPAMIDOL (ISOVUE-300) INJECTION 61% COMPARISON:  Chest radiography 07/17/2016 FINDINGS: Cardiovascular: There is aortic atherosclerosis. No aneurysm or dissection. Heart size is normal. No pericardial fluid. The study was not done as a pulmonary arteriogram study, but no pulmonary emboli are identified. Mediastinum/Nodes: No pathologically enlarged hilar or mediastinal lymph nodes. No esophageal mass. There could be wall thickening of the upper thoracic esophagus as might be seen with esophagitis. Lungs/Pleura: Background pattern of mild emphysema. There is patchy infiltrate in the posterior left lower lobe consistent with mild bronchopneumonia. There is more extensive patchy density in volume loss in the right lower lobe and minimal involvement of the right middle lobe consistent with bronchopneumonia. No pleural effusion. Upper Abdomen: No acute finding. Musculoskeletal: Bilateral breast implants.  No spinal abnormality. IMPRESSION: Patchy pulmonary density consistent with bronchopneumonia, most pronounced in the right lower lobe but also affecting the left lower lobe and to a minimal extent the right middle lobe. No pleural effusion.  No lymphadenopathy. Question upper thoracic esophageal wall thickening that could go along with esophagitis. This is not definite. Electronically Signed   By: MNelson ChimesM.D.   On: 07/18/2016 15:20    Review Of Systems Constitutional: No fever or chills. HEENT: No change in vision or hearing.Recent h/o of dysphagia. Respiratory: Positive cough and shortness of breath. Cardiovascular: No chest pain, palpitation or leg edema. GI: No nausea,  vomiting or diarrhea, hepatitis or GI bleed. GU: No kidney stone or hematuria. Skin: No rashes. Neurology: No headaches or stroke.Positive seizures. Endocrinology: No polyuria, polydipsia or polyphagia. Psych: No admission to psych unit for depression or anxiety or detox.  Blood pressure (!) 78/51, pulse 94, temperature 100.2 F (37.9 C), temperature source Oral, resp. rate 13, height '5\' 4"'  (1.626 m), weight 59 kg (130 lb), SpO2 100 %. Body mass index is 22.31 kg/m. General appearance: alert, cooperative, appears stated age and mild distress Head: Normocephalic, atraumatic Eyes: Pink conjunctivae,  corneas clear. PERRL, EOM's intact.  Throat: Erythema of pharynx. Neck: right submandibular and anterior cervical area swelling. no JVD. Resp: Rare crqackles to auscultation bilaterally Cardio: Regular rate and rhythm, S1, S2 normal, II/VI systolic murmur, no click, rub or gallop GI: soft, non-tender; bowel sounds normal. Extremities: extremities normal, no cyanosis or edema Skin: Warm and dry. No rashes or lesions Neurologic: Alert and oriented X 3, normal strength and tone. Normal coordination.  Assessment/Plan Acute Sepsis Possible bilateral bronchopneumonia pharyngitis/laryngitis with abscess Abnormal troponin-I, r/o MI, r/o CHF  Agree with IV fluids, IV antibiotic + Albuterol inhalation. Echocardiogram for LV function.  Birdie Riddle, MD  07/18/2016, 5:33 PM

## 2016-07-18 NOTE — ED Notes (Signed)
Pt cannot use restroom at this time, aware urine specimen is needed.  

## 2016-07-18 NOTE — ED Notes (Signed)
Felipa EthBrenna Annison Birchard, RN attempted IV x1 without success. Consult to IV team.

## 2016-07-18 NOTE — Telephone Encounter (Signed)
Patient Name: Janet DieselANCY Blattner  DOB: 01/30/1951    Initial Comment Caller states she took her to the Urgent Care- Upper Respiratory Infection. Her tongue is swelling up, hard to talk. She's on Levaquin and cough syrup. Her tongue was swelling before the medication.   Nurse Assessment  Nurse: Sherilyn CooterHenry, RN, Thurmond ButtsWade Date/Time Lamount Cohen(Eastern Time): 07/18/2016 12:02:55 PM  Confirm and document reason for call. If symptomatic, describe symptoms. You must click the next button to save text entered. ---Caller states she took her to the Urgent Care yesterday and she was diagnosed with Acute Upper Respiratory Infection. Her tongue is swelling up, hard to talk. She's on Levaquin and cough syrup. Her tongue was swelling for the past 2 days, before the medication and before she was seen by the urgent care. Denies difficulty breathing. She is still drinking fluids and urinating. No swelling outside of her mouth. She feels warm to touch, does not have a thermometer. [facility]. She doesn't feel as hot today as she did yesterday. She is a smoker. They didn't really check her tongue. She has some tenderness in her neck and swollen glands. She rates her pain with this as 10 on 0-10 scale. She has not taken anything for the pain.  Has the patient traveled out of the country within the last 30 days? ---No  Does the patient have any new or worsening symptoms? ---Yes  Will a triage be completed? ---Yes  Related visit to physician within the last 2 weeks? ---Yes  Does the PT have any chronic conditions? (i.e. diabetes, asthma, etc.) ---Yes  List chronic conditions. ---Diabetes  Is this a behavioral health or substance abuse call? ---No     Guidelines    Guideline Title Affirmed Question Affirmed Notes  Tongue Swelling Pain in tongue, mouth, or tooth    Final Disposition User   Go to ED Now Sherilyn CooterHenry, RN, [place]    [facility]   Disagree/Comply: Comply

## 2016-07-18 NOTE — H&P (Signed)
TRH H&P   Patient Demographics:    [name], is a 65 y.o. female  MRN: [id number]   DOB - 07-10-1951  Admit Date - 07/18/2016  Outpatient Primary MD for the patient is [name], MD  Referring MD: Dr.Haviland  Outpatient Specialists: None   Patient coming from: Home  Chief Complaint  Patient presents with  . Oral Swelling  . Urinary Retention      HPI:    [name]  is a 65 y.o. female,With a history of CVA with residual left-sided weakness, mostly wheelchair-bound, hypertension, chronic low back pain on Dilaudid, anxiety, who presented to the ED with 2 day history of shortness of breath with increase swelling in her throat with dysphagia. She has subjective fever. She went to urgent care yesterday where she had a chest x-ray and was told she had upper respiratory infection and was discharged on Levaquin. Her neck swelling started to get worse and was increasingly short of breath. She took a dose of Levaquin yesterday. Denied any sick contact or travel. Denied recent illness, new medications. Patient is able to eat and drink and swallow pills with much difficulty. She denies any nausea, vomiting, chills, body ache, headache, blurred vision, abdominal pain, dysuria or diarrhea. Has not been able to urinate since this morning.  Course in the ED patient was septic with fever of 102.66F, tachycardic, tachypneic and hypotensive with blood pressure 78/63 mmHg. Blood work showed significant leukocytosis with WBC of 20 7K sodium of 133, chloride 95, normal renal function, glucose of 236 and elevated troponin of 1.02. Lactic acid elevated to 1.93.  Sepsis pathway initiated and patient received 2 L normal saline bolus with improvement in systolic blood pressure to high 90s. CT soft tissue of the neck showed generalized pharyngitis and laryngitis with pharyngeal mucosal space edema  with mild involvement of the epiglottis. Also showed mild parapharyngeal and retropharyngeal inflammation. Also commented on possible upper esophagitis. This was discussed with ENT Dr. Lazarus Salines by ED physician who recommended antibiotics, steroid and conservative measures and did not appear to have acute epiglottitis or airway compromise. A CT of the chest done showed multifocal pneumonia and esophagitis.  Blood cultures sent. Patient given IV Rocephin and azithromycin. Hospitalist admission requested to stepdown unit.   Review of systems:    In addition to the HPI above,  Fever present, no chills No Headache, No changes with Vision or hearing, Difficulty swallowing food or Liquids, throat pain+ No Chest pain, nonproductive Cough and Shortness of Breath++, No Abdominal pain, No Nausea or vomiting, Bowel movements are regular, No Blood in stool  No dysuria,  Urinary retention+ No new skin rashes or bruises, No new joints pains-aches,  chronic left-sided weakness No recent weight gain or loss, No polyuria, polydypsia or polyphagia, No significant Mental Stressors.  A full 10 point Review of Systems was done, except as stated  above, all other Review of Systems were negative.   With Past History of the following :    Past Medical History:  Diagnosis Date  . Anoxic brain injury (HCC)   . Anxiety   . Chronic neck pain   . Chronic pain in shoulder   . COPD (chronic obstructive pulmonary disease) (HCC)   . Depression   . Fracture of left humerus 06/24/2014  . Gait disorder   . GERD (gastroesophageal reflux disease)   . Hernia of abdominal cavity   . 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 Hyperlipidemia   . LBP (low back pain)   . Neuropathy (HCC)   . Osteoarthritis   . Seizure disorder (HCC)   . Seizures (HCC)   . Type II or unspecified type diabetes mellitus without mention of complication, not stated as uncontrolled 2010  . Vitamin B 12  deficiency 2011      Past Surgical History:  Procedure Laterality Date  . COLONOSCOPY    . DIAGNOSTIC LAPAROSCOPY     PMH: Exploratory lap  . HERNIA REPAIR     incisional  . ORIF HUMERUS FRACTURE Left 06/24/2014   Procedure: LEFT OPEN REDUCTION INTERNAL FIXATION (ORIF) PROXIMAL HUMERUS FRACTURE;  Surgeon: Eulas Post, MD;  Location: [facility] OR;  Service: Orthopedics;  Laterality: Left;  . PANCREATECTOMY    . TONSILLECTOMY    . TUBAL LIGATION        Social History:     Social History  Substance Use Topics  . Smoking status: Current Every Day Smoker    Packs/day: 1.00    Years: 20.00    Types: Cigarettes  . Smokeless tobacco: Never Used  . Alcohol use 6.0 oz/week    10 Glasses of wine per week     Comment: 1 glass of wine daily     Lives - At home  Mobility - mostly wheelchair-bound     Family History :     Family History  Problem Relation Age of Onset  . Heart disease Father 44    MI  . Hypertension Other       Home Medications:   Prior to Admission medications   Medication Sig Start Date End Date Taking? Authorizing Provider  aspirin 81 MG EC tablet Take 81 mg by mouth daily.     Yes Historical Provider, MD  carbidopa-levodopa (SINEMET IR) 25-250 MG tablet Take 1 tablet by mouth 3 (three) times daily. 08/17/15  Yes Tresa Garter, MD  cholecalciferol (VITAMIN D) 1000 UNITS tablet Take 2 tablets (2,000 Units total) by mouth daily. 03/15/15  Yes Georgina Quint Plotnikov, MD  HYDROmorphone (DILAUDID) 2 MG tablet Take 2-3 tablets (4-6 mg total) by mouth every 6 (six) hours as needed for moderate pain or severe pain. Patient taking differently: Take 2 mg by mouth every 8 (eight) hours as needed for moderate pain or severe pain.  06/24/14  Yes Teryl Lucy, MD  levofloxacin (LEVAQUIN) 500 MG tablet Take 1 tablet (500 mg total) by mouth daily. 07/17/16  Yes Linna Hoff, MD  LORazepam (ATIVAN) 2 MG tablet Take 0.5-1 tablets (1-2 mg total) by mouth 3 (three) times  daily. Patient taking differently: Take 2 mg by mouth 3 (three) times daily.  07/02/16  Yes Georgina Quint Plotnikov, MD  metFORMIN (GLUCOPHAGE-XR) 500 MG 24 hr tablet TAKE 1 TABLET THREE TIMES A DAY 02/22/15  Yes Georgina Quint Plotnikov, MD  promethazine (PHENERGAN) 12.5 MG tablet Take 1 tablet (12.5  mg total) by mouth every 6 (six) hours as needed. for nausea 09/29/15  Yes Georgina Quint Plotnikov, MD  tolterodine (DETROL LA) 4 MG 24 hr capsule TAKE ONE CAPSULE AT BEDTIME 01/24/16  Yes Georgina Quint Plotnikov, MD  Vitamin D, Ergocalciferol, (DRISDOL) 50000 units CAPS capsule TAKE 1 CAPSULE (50,000 UNITS TOTAL) BY MOUTH ONCE A WEEK. 03/28/16  Yes Georgina Quint Plotnikov, MD  zolpidem (AMBIEN) 10 MG tablet Take 1 tablet (10 mg total) by mouth at bedtime as needed. for sleep Patient taking differently: Take 10 mg by mouth at bedtime. for sleep 07/02/16  Yes Georgina Quint Plotnikov, MD  mirtazapine (REMERON) 15 MG tablet TAKE 1 TABLET BY MOUTH AT BEDTIME AT 6-8 PM Patient not taking: Reported on 07/18/2016 05/01/16   Georgina Quint Plotnikov, MD  zoster vaccine live, PF, (ZOSTAVAX) 16109 UNT/0.65ML injection Inject 19,400 Units into the skin once. 05/26/15   Tresa Garter, MD     Allergies:     Allergies  Allergen Reactions  . Alendronate Sodium   . Asa [Aspirin] Other (See Comments)    "stomach burning", "I can take baby aspirin"  . Fluoxetine Hcl      Physical Exam:   Vitals  Blood pressure 101/62, pulse 96, temperature 100.2 F (37.9 C), temperature source Oral, resp. rate 12, height 5\' 4"  (1.626 m), weight 59 kg (130 lb), SpO2 98 %.   General : Elderly female not in distress HEENT: No pallor, swollen neck prominent over the right side, cervical lymphadenopathy, oral thrush, poorly visualized posterior pharyngeal wall with hyperemia. Chest: Fine bibasilar crackles CVS: Normal S1 and S2, no murmurs rub or gallop GI: Soft, nondistended, nontender, bowel sounds present Muscular skills: Warm, no edema CNS: Alert  and oriented, 4/5 power over her left extremity     Data Review:    CBC  Recent Labs Lab 07/18/16 1319  WBC 27.0*  HGB 14.6  HCT 44.6  PLT 230  MCV 91.0  MCH 29.8  MCHC 32.7  RDW 14.8  LYMPHSABS 1.1  MONOABS 1.4*  EOSABS 0.0  BASOSABS 0.0   ------------------------------------------------------------------------------------------------------------------  Chemistries   Recent Labs Lab 07/18/16 1319  NA 133*  K 4.0  CL 95*  CO2 26  GLUCOSE 236*  BUN 10  CREATININE 0.71  CALCIUM 8.5*  AST 18  ALT 8*  ALKPHOS 113  BILITOT 1.2   ------------------------------------------------------------------------------------------------------------------ estimated creatinine clearance is 60.5 mL/min (by C-G formula based on SCr of 0.71 mg/dL). ------------------------------------------------------------------------------------------------------------------ No results for input(s): TSH, T4TOTAL, T3FREE, THYROIDAB in the last 72 hours.  Invalid input(s): FREET3  Coagulation profile No results for input(s): INR, PROTIME in the last 168 hours. ------------------------------------------------------------------------------------------------------------------- No results for input(s): DDIMER in the last 72 hours. -------------------------------------------------------------------------------------------------------------------  Cardiac Enzymes  Recent Labs Lab 07/18/16 1319  TROPONINI 1.02*   ------------------------------------------------------------------------------------------------------------------ No results found for: BNP   ---------------------------------------------------------------------------------------------------------------  Urinalysis    Component Value Date/Time   COLORURINE LT. YELLOW 12/24/2012 1626   APPEARANCEUR SL CLOUDY 12/24/2012 1626   LABSPEC 1.010 12/24/2012 1626   PHURINE 6.0 12/24/2012 1626   GLUCOSEU NEGATIVE 12/24/2012 1626    HGBUR NEGATIVE 12/24/2012 1626   BILIRUBINUR NEGATIVE 12/24/2012 1626   KETONESUR NEGATIVE 12/24/2012 1626   PROTEINUR NEGATIVE 06/15/2007 2220   UROBILINOGEN 0.2 12/24/2012 1626   NITRITE NEGATIVE 12/24/2012 1626   LEUKOCYTESUR NEGATIVE 12/24/2012 1626    ----------------------------------------------------------------------------------------------------------------   Imaging Results:    Dg Chest 2 View  Result Date: 07/17/2016 CLINICAL DATA:  Cough, fever and sore throat. EXAM:  CHEST  2 VIEW COMPARISON:  06/24/2014 chest radiograph FINDINGS: The cardiomediastinal silhouette is unremarkable. Subsegmental right lower lung atelectasis noted. There is no evidence of focal airspace disease, pulmonary edema, suspicious pulmonary nodule/mass, pleural effusion, or pneumothorax. No acute bony abnormalities are identified. IMPRESSION: Subsegmental right lower lung atelectasis. No evidence of focal airspace disease/ pneumonia. Electronically Signed   By: Harmon PierJeffrey  Hu M.D.   On: 07/17/2016 11:33   Ct Soft Tissue Neck W Contrast  Result Date: 07/18/2016 CLINICAL DATA:  65 year old female with increased shortness of breath cough and oral swelling since yesterday. Diagnosed with URI. Initial encounter. EXAM: CT NECK WITH CONTRAST TECHNIQUE: Multidetector CT imaging of the neck was performed using the standard protocol following the bolus administration of intravenous contrast. CONTRAST:  75mL ISOVUE-300 IOPAMIDOL (ISOVUE-300) INJECTION 61% COMPARISON:  Brain MRI 03/30/2011. FINDINGS: Pharynx and larynx: Generalized pharyngeal mucosal space thickening involving the oropharynx, hypopharynx, and supraglottic larynx. There is mild mucosal hyper enhancement of the nasopharynx which otherwise appears normal. There is mild thickening of the epiglottis (series 7, image 54). Up to mild associated soft tissue stranding in both parapharyngeal spaces. Trace retropharyngeal effusion. There also appears to be mucosal hyper  enhancement of the proximal cervical esophagus (sagittal image 45). The level of the glottis and subglottic trachea appear normal. Salivary glands: Negative sublingual space. Negative submandibular glands and parotid glands. Thyroid: Negative. Lymph nodes: Hyperenhancing bilateral cervical lymph nodes. Bilateral level 2 lymph nodes are mildly enlarged up to 11 mm short axis. Bilateral level 1 B nodes appear heterogeneous and enlarged up to 10 mm short axis. No cystic or necrotic nodes are identified. Tiny retropharyngeal nodes also are hyper enhancing. Vascular: Major vascular structures in the neck and at the skullbase are patent. Carotid atherosclerosis in the neck without stenosis. Limited intracranial: Negative. Visualized orbits: Negative. Mastoids and visualized paranasal sinuses: Visualized paranasal sinuses and mastoids are stable and well pneumatized. Skeleton: Dental periapical lucency at the posterior right mandible molar. No other acute dental findings. Average for age cervical spine degeneration. No acute osseous abnormality identified. Upper chest: Reported separately today.  Lung apices are clear. Other: None. IMPRESSION: 1. Generalized pharyngitis and laryngitis with pharyngeal mucosal space edema including mild involvement of the epiglottis. Mild parapharyngeal and retropharyngeal inflammation. Mucosal hyper enhancement at the adenoids and also in the cervical esophagus - consider upper esophagitis. 2. Reactive appearing hyper enhancing cervical lymph nodes diffusely, maximal at the level 1 and level 2 stations. 3. No cystic or necrotic nodes.  No neck abscess. 4. Chest CT today reported separately. Electronically Signed   By: Odessa FlemingH  Hall M.D.   On: 07/18/2016 15:23   Ct Chest W Contrast  Result Date: 07/18/2016 CLINICAL DATA:  Upper respiratory infection diagnosed yesterday. Worsening swelling and difficulty eating. Shortness of breath and cough. EXAM: CT CHEST WITH CONTRAST TECHNIQUE:  Multidetector CT imaging of the chest was performed during intravenous contrast administration. CONTRAST:  75mL ISOVUE-300 IOPAMIDOL (ISOVUE-300) INJECTION 61% COMPARISON:  Chest radiography 07/17/2016 FINDINGS: Cardiovascular: There is aortic atherosclerosis. No aneurysm or dissection. Heart size is normal. No pericardial fluid. The study was not done as a pulmonary arteriogram study, but no pulmonary emboli are identified. Mediastinum/Nodes: No pathologically enlarged hilar or mediastinal lymph nodes. No esophageal mass. There could be wall thickening of the upper thoracic esophagus as might be seen with esophagitis. Lungs/Pleura: Background pattern of mild emphysema. There is patchy infiltrate in the posterior left lower lobe consistent with mild bronchopneumonia. There is more extensive patchy density in volume  loss in the right lower lobe and minimal involvement of the right middle lobe consistent with bronchopneumonia. No pleural effusion. Upper Abdomen: No acute finding. Musculoskeletal: Bilateral breast implants.  No spinal abnormality. IMPRESSION: Patchy pulmonary density consistent with bronchopneumonia, most pronounced in the right lower lobe but also affecting the left lower lobe and to a minimal extent the right middle lobe. No pleural effusion.  No lymphadenopathy. Question upper thoracic esophageal wall thickening that could go along with esophagitis. This is not definite. Electronically Signed   By: Paulina Fusi M.D.   On: 07/18/2016 15:20    My personal review of EKG: Sinus tachycardia at 115, no ST-T changes   Assessment & Plan:    Principal Problem:   Sepsis (HCC) Likely combination of lobar pneumonia with pharyngitis and laryngitis with pharyngeal edema. No signs of abscess. Admit to stepdown unit for sepsis and close monitoring of airways. Received 3 L normal saline bolus in the ED with some improvement in blood pressure. Sepsis pathway initiated on admission. Maintenance IV fluids.  Monitor lactic acid, check pro- calcitonin. Empiric IV Rocephin and azithromycin. Follow cultures. Supportive care with Tylenol. Resume home dose Dilaudid. Follow blood cultures.  Active Problems: Pharyngitis/laryngitis with pharyngeal edema Symptoms of dysphagia/odynophagia with nonproductive cough. Antibiotics as above. IV Solu-Medrol 40 mg twice a day. ED physician discussed CT findings with ENT Dr Lazarus Salines, who recommended this was unlikely epiglottitis or contributing to respiratory compromise. Recommended antibiotics, steroids and monitoring. Clear liquid diet.   multilobar pneumonia Empiric antibiotics as above. Follow cultures.  Oral thrush possible esophageal candidiasis/esophagitis CT commends on esophageal thickening with possible esophagitis. Add fluconazole. Add PPI.    Elevated troponin No chest pain symptoms or EKG changes. Suspect demand ischemia. Cardiology consulted by ED. Cycle serial enzymes and EKG. check 2-D echo. Will follow up with recommendations.     DM2 (diabetes mellitus, type 2) (HCC) Hold metformin. I will add low-dose Lantus since patient is on steroid. Monitor with sliding scale coverage.  CVA with residual left-sided weakness Mostly wheelchair-bound. Continue aspirin.    Elevated troponin No chest pain symptoms or EKG changes. Suspect demand ischemia. Cardiology ( Dr Algie Coffer) consulted by ED. Cycle serial enzymes and EKG. Will follow up with recommendations.  Tobacco use Counseled on cessation.  Monitor back pain Resume home dose Dilaudid.  Acute urinary retention  check bladder scan and perform straight cath.   DVT Prophylaxis Lovenox  AM Labs Ordered, also please review Full Orders  Family Communication: Discussed with patient and her caregiver at bedside.  Code Status no code  Likely DC to  home  Condition GUARDED    Consults called: Cardiology   Admission status: Inpatient  Time spent in minutes : 70   Eddie North  M.D on 07/18/2016 at 4:43 PM  Between 7am to 7pm - Pager - [phone]. After 7pm go to [email or web address] - password [facility]  [facility] - Office  [phone]

## 2016-07-18 NOTE — ED Notes (Signed)
Went to hang meds/fluids. Pt at CT.

## 2016-07-19 ENCOUNTER — Inpatient Hospital Stay (HOSPITAL_COMMUNITY): Payer: Medicare Other

## 2016-07-19 DIAGNOSIS — R778 Other specified abnormalities of plasma proteins: Secondary | ICD-10-CM

## 2016-07-19 DIAGNOSIS — R748 Abnormal levels of other serum enzymes: Secondary | ICD-10-CM

## 2016-07-19 DIAGNOSIS — J9601 Acute respiratory failure with hypoxia: Secondary | ICD-10-CM

## 2016-07-19 DIAGNOSIS — R7989 Other specified abnormal findings of blood chemistry: Secondary | ICD-10-CM

## 2016-07-19 DIAGNOSIS — J189 Pneumonia, unspecified organism: Secondary | ICD-10-CM

## 2016-07-19 DIAGNOSIS — R739 Hyperglycemia, unspecified: Secondary | ICD-10-CM

## 2016-07-19 DIAGNOSIS — R338 Other retention of urine: Secondary | ICD-10-CM

## 2016-07-19 LAB — CBC
HCT: 39 % (ref 36.0–46.0)
Hemoglobin: 12.9 g/dL (ref 12.0–15.0)
MCH: 29.7 pg (ref 26.0–34.0)
MCHC: 33.1 g/dL (ref 30.0–36.0)
MCV: 89.9 fL (ref 78.0–100.0)
Platelets: 201 10*3/uL (ref 150–400)
RBC: 4.34 MIL/uL (ref 3.87–5.11)
RDW: 14.6 % (ref 11.5–15.5)
WBC: 19 10*3/uL — ABNORMAL HIGH (ref 4.0–10.5)

## 2016-07-19 LAB — ECHOCARDIOGRAM COMPLETE
E decel time: 208 msec
E/e' ratio: 6.79
FS: 29 % (ref 28–44)
Height: 65 in
IVS/LV PW RATIO, ED: 0.75
LA ID, A-P, ES: 29 mm
LA diam end sys: 29 mm
LA diam index: 1.71 cm/m2
LA vol A4C: 24 ml
LA vol index: 18.9 mL/m2
LA vol: 32.2 mL
LV E/e' medial: 6.79
LV E/e'average: 6.79
LV PW d: 10.9 mm — AB (ref 0.6–1.1)
LV e' LATERAL: 9.79 cm/s
LVOT SV: 60 mL
LVOT VTI: 21 cm
LVOT area: 2.84 cm2
LVOT diameter: 19 mm
LVOT peak vel: 97.5 cm/s
MV Dec: 208
MV pk A vel: 87.3 m/s
MV pk E vel: 66.5 m/s
Reg peak vel: 257 cm/s
TDI e' lateral: 9.79
TDI e' medial: 6.42
TR max vel: 257 cm/s
Weight: 2243.4 oz

## 2016-07-19 LAB — BASIC METABOLIC PANEL
Anion gap: 7 (ref 5–15)
BUN: 8 mg/dL (ref 6–20)
CO2: 21 mmol/L — ABNORMAL LOW (ref 22–32)
Calcium: 7.3 mg/dL — ABNORMAL LOW (ref 8.9–10.3)
Chloride: 105 mmol/L (ref 101–111)
Creatinine, Ser: 0.51 mg/dL (ref 0.44–1.00)
GFR calc Af Amer: >60 mL/min (ref >60)
GFR calc non Af Amer: >60 mL/min (ref >60)
Glucose, Bld: 244 mg/dL — ABNORMAL HIGH (ref 65–99)
Potassium: 4.2 mmol/L (ref 3.5–5.1)
Sodium: 133 mmol/L — ABNORMAL LOW (ref 135–145)

## 2016-07-19 LAB — TROPONIN I
Troponin I: 0.51 ng/mL (ref <0.03)
Troponin I: 0.37 ng/mL (ref <0.03)

## 2016-07-19 LAB — GLUCOSE, CAPILLARY
Glucose-Capillary: 251 mg/dL — ABNORMAL HIGH (ref 65–99)
Glucose-Capillary: 205 mg/dL — ABNORMAL HIGH (ref 65–99)
Glucose-Capillary: 185 mg/dL — ABNORMAL HIGH (ref 65–99)
Glucose-Capillary: 204 mg/dL — ABNORMAL HIGH (ref 65–99)

## 2016-07-19 LAB — URINALYSIS, ROUTINE W REFLEX MICROSCOPIC
Bilirubin Urine: NEGATIVE
Glucose, UA: 250 mg/dL — AB
Hgb urine dipstick: NEGATIVE
Ketones, ur: NEGATIVE mg/dL
Leukocytes, UA: NEGATIVE
Nitrite: NEGATIVE
Protein, ur: NEGATIVE mg/dL
Specific Gravity, Urine: >1.046 — ABNORMAL HIGH (ref 1.005–1.030)
pH: 6 (ref 5.0–8.0)

## 2016-07-19 LAB — HIV ANTIBODY (ROUTINE TESTING W REFLEX): HIV Screen 4th Generation wRfx: NONREACTIVE

## 2016-07-19 MED ORDER — INSULIN GLARGINE 100 UNIT/ML ~~LOC~~ SOLN
15.0000 [IU] | Freq: Every day | SUBCUTANEOUS | Status: DC
  Administered 2016-07-19 – 2016-07-22 (×4): 15 [IU] via SUBCUTANEOUS
  Filled 2016-07-19 (×6): qty 0.15

## 2016-07-19 MED ORDER — PHENOL 1.4 % MT LIQD
1.0000 | OROMUCOSAL | Status: DC | PRN
  Filled 2016-07-19: qty 177

## 2016-07-19 NOTE — Progress Notes (Signed)
PROGRESS NOTE                                                                                                                                                                                                             Patient Demographics:    [name], is a 65 y.o. female, DOB - 1951-01-18, ZOX:[id number]  Admit date - 07/18/2016   Admitting Physician Eddie North, MD  Outpatient Primary MD for the patient is [name], MD  LOS - 1  Outpatient Specialists:None  Chief Complaint  Patient presents with  . Oral Swelling  . 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 She was prescribed Levaquin at the urgent care with diagnosis of lower extremity infection one day back. She returned to the ED with worsened symptoms where she was found to be septic with systolic blood pressure in the 70s, tachypnea, fever of 102F,   significant leukocytosis (27K), and elevated lactic acid. Also had markedly elevated troponin. CT of the neck showing pharyngitis and laryngitis with pharyngeal edema and esophagitis. CT of the chest showing bilateral lower lobe infiltrate. Admitted to stepdown unit for sepsis.   Subjective:   Still has dysphagia and odynophagia but dyspnea has improved. Tolerating clears with some difficulty.   Assessment  & Plan :    Principal Problem:   Sepsis (HCC) Secondary to combination of pharyngitis/laryngitis with pharyngeal edema and lobar pneumonia. No signs of abscess on admission CT. Sepsis improving with aggressive IV hydration, empiric antibiotics. WBC better. Afebrile this morning. Blood culture so far  negative. Continue empiric Rocephin and azithromycin. Supportive care with pain medications and Tylenol. If symptoms persistent or worsen in the next 24 hours she will need repeat CT of the neck and ENT consult.    Active Problems: Pharyngitis/laryngitis with pharyngeal edema Symptoms of dysphagia/odynophagia with nonproductive cough. Antibiotics as above. IV Solu-Medrol 40 mg twice a day. ED physician discussed CT findings with ENT Dr Lazarus Salines, who recommended this was unlikely epiglottitis or contributing to respiratory compromise. Recommended antibiotics, steroids and monitoring. Clear liquid diet. If symptoms  unimproved or worsen over the next 24 hours, needs repeat CT of the neck and ENT consult.   multilobar pneumonia Empiric antibiotics as above. Follow cultures.  Oral thrush possible esophageal candidiasis/esophagitis CT shows esophageal thickening with possible esophagitis. Added fluconazole and PPI.    Elevated troponin No chest pain symptoms or EKG changes. Suspect demand ischemia. Cardiology consulted by ED. Cycle serial enzymes and EKG. check 2-D echo. Will follow up with recommendations.     DM2 (diabetes mellitus, type 2) (HCC) Hold metformin. A1c of 8.8. Added Lantus while patient is on steroid. Continue sliding scale coverage.  CVA with residual left-sided weakness Mostly wheelchair-bound. Continue aspirin.    Elevated troponin No chest pain symptoms or EKG changes. Suspect demand ischemia. Cardiology ( Dr Algie CofferKadakia) consulted by ED. serial troponins improving (1>>0.9>>0.5). Check 2-D echo.  Tobacco use Counseled on cessation.  Chronic back pain Stable on home dose Dilaudid  Acute urinary retention Required Foley catheter. Check renal ultrasound.      Code Status : Full code  Family Communication  : Husband at bedside  Disposition Plan  : Transfer to telemetry, home possibly in the next 72 hours if symptoms improve.  Barriers For Discharge  : Active symptoms  Consults  :   ENT ( Dr Lazarus SalinesWolicki) consulted on the phone by ED physician Cardiology Dr. Algie CofferKadakia  Procedures  :  CT neck and chest 2-D echo  DVT Prophylaxis  :  Lovenox -  Lab Results  Component Value Date   PLT 201 07/19/2016    Antibiotics  :    Anti-infectives    Start     Dose/Rate Route Frequency Ordered Stop   07/19/16 1700  azithromycin (ZITHROMAX) 500 mg in dextrose 5 % 250 mL IVPB     500 mg 250 mL/hr over 60 Minutes Intravenous Every 24 hours 07/18/16 1814     07/19/16 1600  cefTRIAXone (ROCEPHIN) 1 g in dextrose 5 % 50 mL IVPB     1 g 100 mL/hr over 30 Minutes Intravenous Every 24 hours 07/18/16 1814     07/18/16 2000  fluconazole (DIFLUCAN) IVPB 100 mg     100 mg 50 mL/hr over 60 Minutes Intravenous Every 24 hours 07/18/16 1813     07/18/16 1500  cefTRIAXone (ROCEPHIN) 1 g in dextrose 5 % 50 mL IVPB     1 g 100 mL/hr over 30 Minutes Intravenous  Once 07/18/16 1449 07/18/16 1654   07/18/16 1500  azithromycin (ZITHROMAX) 500 mg in dextrose 5 % 250 mL IVPB     500 mg 250 mL/hr over 60 Minutes Intravenous  Once 07/18/16 1449 07/18/16 1751        Objective:   Vitals:   07/19/16 0702 07/19/16 0800 07/19/16 0900 07/19/16 1000  BP:  (!) 151/79 (!) 148/74 (!) 151/73  Pulse:  84 84 89  Resp:  14 15 16   Temp: 98.1 F (36.7 C)     TempSrc: Oral     SpO2:  91% 93% 90%  Weight:      Height:        Wt Readings from Last 3 Encounters:  07/18/16 63.6 kg (140 lb 3.4 oz)  07/02/16 61.7 kg (136 lb)  05/07/16 61.5 kg (135 lb 8 oz)     Intake/Output Summary (Last 24 hours) at 07/19/16 1048 Last data filed at 07/19/16 1000  Gross per 24 hour  Intake          3057.33 ml  Output  375 ml  Net          2682.33 ml     Physical Exam  Gen: Appears fatigued, not in distress HEENT: no pallor, right neck swollen with him tenderness, oral thrush better, erythema or posterior pharyngeal wall Chest: Diminished bibasilar breath sounds CVS:  N S1&S2, no murmurs, rubs or gallop GI: soft, NT, ND, BS+, Foley placed on admission. Musculoskeletal: warm, no edema CNS: Alert and oriented    Data Review:    CBC  Recent Labs Lab 07/18/16 1319 07/19/16 0652  WBC 27.0* 19.0*  HGB 14.6 12.9  HCT 44.6 39.0  PLT 230 201  MCV 91.0 89.9  MCH 29.8 29.7  MCHC 32.7 33.1  RDW 14.8 14.6  LYMPHSABS 1.1  --   MONOABS 1.4*  --   EOSABS 0.0  --   BASOSABS 0.0  --     Chemistries   Recent Labs Lab 07/18/16 1319 07/19/16 0652  NA 133* 133*  K 4.0 4.2  CL 95* 105  CO2 26 21*  GLUCOSE 236* 244*  BUN 10 8  CREATININE 0.71 0.51  CALCIUM 8.5* 7.3*  AST 18  --   ALT 8*  --   ALKPHOS 113  --   BILITOT 1.2  --    ------------------------------------------------------------------------------------------------------------------ No results for input(s): CHOL, HDL, LDLCALC, TRIG, CHOLHDL, LDLDIRECT in the last 72 hours.  Lab Results  Component Value Date   HGBA1C 8.8 (H) 12/28/2015   ------------------------------------------------------------------------------------------------------------------ No results for input(s): TSH, T4TOTAL, T3FREE, THYROIDAB in the last 72 hours.  Invalid input(s): FREET3 ------------------------------------------------------------------------------------------------------------------ No results for input(s): VITAMINB12, FOLATE, FERRITIN, TIBC, IRON, RETICCTPCT in the last 72 hours.  Coagulation profile  Recent Labs Lab 07/18/16 1725  INR 1.10    No results for input(s): DDIMER in the last 72 hours.  Cardiac Enzymes  Recent Labs Lab 07/18/16 1811 07/19/16 0009 07/19/16 0652  TROPONINI 0.92* 0.51* 0.37*   ------------------------------------------------------------------------------------------------------------------    Component Value Date/Time   BNP 333.6 (H) 07/18/2016 1327    Inpatient Medications  Scheduled Meds: . aspirin  81 mg Oral Daily  . aspirin  300 mg Rectal  Once  . azithromycin  500 mg Intravenous Q24H  . carbidopa-levodopa  1 tablet Oral TID  . cefTRIAXone (ROCEPHIN)  IV  1 g Intravenous Q24H  . cholecalciferol  2,000 Units Oral Daily  . enoxaparin (LOVENOX) injection  40 mg Subcutaneous Q24H  . fesoterodine  8 mg Oral Daily  . fluconazole (DIFLUCAN) IV  100 mg Intravenous Q24H  . insulin aspart  0-15 Units Subcutaneous TID WC  . insulin aspart  0-5 Units Subcutaneous QHS  . insulin glargine  15 Units Subcutaneous Daily  . LORazepam  1-2 mg Oral TID  . methylPREDNISolone (SOLU-MEDROL) injection  40 mg Intravenous Q12H  . sodium chloride flush  3 mL Intravenous Q12H  . [START ON 07/24/2016] Vitamin D (Ergocalciferol)  50,000 Units Oral Q7 days   Continuous Infusions: . sodium chloride 75 mL/hr at 07/19/16 0846   PRN Meds:.albuterol, HYDROmorphone, oxyCODONE, zolpidem  Micro Results Recent Results (from the past 240 hour(s))  Rapid strep screen (not at [facility])     Status: None   Collection Time: 07/18/16  5:11 PM  Result Value Ref Range Status   Streptococcus, Group A Screen (Direct) NEGATIVE NEGATIVE Final    Comment: (NOTE) A Rapid Antigen test may result negative if the antigen level in the sample is below the detection level of this test. The FDA has not cleared this  test as a stand-alone test therefore the rapid antigen negative result has reflexed to a Group A Strep culture.   MRSA PCR Screening     Status: None   Collection Time: 07/18/16  6:06 PM  Result Value Ref Range Status   MRSA by PCR NEGATIVE NEGATIVE Final    Comment:        The GeneXpert MRSA Assay (FDA approved for NASAL specimens only), is one component of a comprehensive MRSA colonization surveillance program. It is not intended to diagnose MRSA infection nor to guide or monitor treatment for MRSA infections.     Radiology Reports Dg Chest 2 View  Result Date: 07/17/2016 CLINICAL DATA:  Cough, fever and sore throat. EXAM: CHEST  2 VIEW COMPARISON:   06/24/2014 chest radiograph FINDINGS: The cardiomediastinal silhouette is unremarkable. Subsegmental right lower lung atelectasis noted. There is no evidence of focal airspace disease, pulmonary edema, suspicious pulmonary nodule/mass, pleural effusion, or pneumothorax. No acute bony abnormalities are identified. IMPRESSION: Subsegmental right lower lung atelectasis. No evidence of focal airspace disease/ pneumonia. Electronically Signed   By: Harmon Pier M.D.   On: 07/17/2016 11:33   Ct Soft Tissue Neck W Contrast  Result Date: 07/18/2016 CLINICAL DATA:  65 year old female with increased shortness of breath cough and oral swelling since yesterday. Diagnosed with URI. Initial encounter. EXAM: CT NECK WITH CONTRAST TECHNIQUE: Multidetector CT imaging of the neck was performed using the standard protocol following the bolus administration of intravenous contrast. CONTRAST:  75mL ISOVUE-300 IOPAMIDOL (ISOVUE-300) INJECTION 61% COMPARISON:  Brain MRI 03/30/2011. FINDINGS: Pharynx and larynx: Generalized pharyngeal mucosal space thickening involving the oropharynx, hypopharynx, and supraglottic larynx. There is mild mucosal hyper enhancement of the nasopharynx which otherwise appears normal. There is mild thickening of the epiglottis (series 7, image 54). Up to mild associated soft tissue stranding in both parapharyngeal spaces. Trace retropharyngeal effusion. There also appears to be mucosal hyper enhancement of the proximal cervical esophagus (sagittal image 45). The level of the glottis and subglottic trachea appear normal. Salivary glands: Negative sublingual space. Negative submandibular glands and parotid glands. Thyroid: Negative. Lymph nodes: Hyperenhancing bilateral cervical lymph nodes. Bilateral level 2 lymph nodes are mildly enlarged up to 11 mm short axis. Bilateral level 1 B nodes appear heterogeneous and enlarged up to 10 mm short axis. No cystic or necrotic nodes are identified. Tiny retropharyngeal  nodes also are hyper enhancing. Vascular: Major vascular structures in the neck and at the skullbase are patent. Carotid atherosclerosis in the neck without stenosis. Limited intracranial: Negative. Visualized orbits: Negative. Mastoids and visualized paranasal sinuses: Visualized paranasal sinuses and mastoids are stable and well pneumatized. Skeleton: Dental periapical lucency at the posterior right mandible molar. No other acute dental findings. Average for age cervical spine degeneration. No acute osseous abnormality identified. Upper chest: Reported separately today.  Lung apices are clear. Other: None. IMPRESSION: 1. Generalized pharyngitis and laryngitis with pharyngeal mucosal space edema including mild involvement of the epiglottis. Mild parapharyngeal and retropharyngeal inflammation. Mucosal hyper enhancement at the adenoids and also in the cervical esophagus - consider upper esophagitis. 2. Reactive appearing hyper enhancing cervical lymph nodes diffusely, maximal at the level 1 and level 2 stations. 3. No cystic or necrotic nodes.  No neck abscess. 4. Chest CT today reported separately. Electronically Signed   By: Odessa Fleming M.D.   On: 07/18/2016 15:23   Ct Chest W Contrast  Result Date: 07/18/2016 CLINICAL DATA:  Upper respiratory infection diagnosed yesterday. Worsening swelling and difficulty eating. Shortness of  breath and cough. EXAM: CT CHEST WITH CONTRAST TECHNIQUE: Multidetector CT imaging of the chest was performed during intravenous contrast administration. CONTRAST:  75mL ISOVUE-300 IOPAMIDOL (ISOVUE-300) INJECTION 61% COMPARISON:  Chest radiography 07/17/2016 FINDINGS: Cardiovascular: There is aortic atherosclerosis. No aneurysm or dissection. Heart size is normal. No pericardial fluid. The study was not done as a pulmonary arteriogram study, but no pulmonary emboli are identified. Mediastinum/Nodes: No pathologically enlarged hilar or mediastinal lymph nodes. No esophageal mass. There  could be wall thickening of the upper thoracic esophagus as might be seen with esophagitis. Lungs/Pleura: Background pattern of mild emphysema. There is patchy infiltrate in the posterior left lower lobe consistent with mild bronchopneumonia. There is more extensive patchy density in volume loss in the right lower lobe and minimal involvement of the right middle lobe consistent with bronchopneumonia. No pleural effusion. Upper Abdomen: No acute finding. Musculoskeletal: Bilateral breast implants.  No spinal abnormality. IMPRESSION: Patchy pulmonary density consistent with bronchopneumonia, most pronounced in the right lower lobe but also affecting the left lower lobe and to a minimal extent the right middle lobe. No pleural effusion.  No lymphadenopathy. Question upper thoracic esophageal wall thickening that could go along with esophagitis. This is not definite. Electronically Signed   By: Paulina Fusi M.D.   On: 07/18/2016 15:20   Dexascan  Result Date: 07/08/2016 Date of study: 07/02/16 Exam: DUAL X-RAY ABSORPTIOMETRY (DXA) FOR BONE MINERAL DENSITY (BMD) Instrument: [employer] Requesting Provider: PCP Indication: screening for osteoporosis Comparison: none (please note that it is not possible to compare data from different instruments) Clinical data: Pt is a 65 y.o. female without history of fracture. Results:  Lumbar spine (L1-L4) Femoral neck (FN) 33% distal radius T-score - 1.2 RFN: - 2.5 LFN: - 1.5 n/a Change in BMD from previous DXA test (%) Down 5.8% Down 5.5% n/a (*) statistically significant Assessment: Patient has OSTEOPOROSIS according to the [facility] classification for osteoporosis (see below). Fracture risk: high Comments: the technical quality of the study is good Evaluation for secondary causes should be considered if clinically indicated. Recommend optimizing calcium (1200 mg/day) and vitamin D (800 IU/day). Followup: Repeat BMD is appropriate after 2 years or after 1-2 years if starting  treatment. WHO criteria for diagnosis of osteoporosis in postmenopausal women and in men 26 y/o or older: - normal: T-score -1.0 to + 1.0 - osteopenia/low bone density: T-score between -2.5 and -1.0 - osteoporosis: T-score below -2.5 - severe osteoporosis: T-score below -2.5 with history of fragility fracture Note: although not part of the WHO classification, the presence of a fragility fracture, regardless of the T-score, should be considered diagnostic of osteoporosis, provided other causes for the fracture have been excluded. Treatment: The National Osteoporosis Foundation recommends that treatment be considered in postmenopausal women and men age 74 or older with: 1. Hip or vertebral (clinical or morphometric) fracture 2. T-score of - 2.5 or lower at the spine or hip 3. 10-year fracture probability by FRAX of at least 20% for a major osteoporotic fracture and 3% for a hip fracture Roxy Manns MD   Dg Chest Port 1 View  Result Date: 07/19/2016 CLINICAL DATA:  Shortness of breath. EXAM: PORTABLE CHEST 1 VIEW COMPARISON:  CT 07/18/2016. FINDINGS: Persistent mild bibasilar infiltrates, unchanged from prior CT. Heart size stable. Small left pleural effusion cannot be excluded. No pneumothorax. IMPRESSION: Persistent mild bibasilar infiltrates, unchanged from prior CT of 07/18/2016. Electronically Signed   By: Maisie Fus  Register   On: 07/19/2016 06:49    Time  Spent in minutes  35   Eddie North M.D on 07/19/2016 at 10:48 AM  Between 7am to 7pm - Pager - [phone]  After 7pm go to [email or web address] - password [email or web address]  [facility] -  Office  [phone]

## 2016-07-19 NOTE — Care Management Note (Signed)
Case Management Note  Patient Details  Name: Claretta Fraiseanci D Aschoff MRN: [id number] Date of Birth: 30-Nov-1950  Subjective/Objective:              sepsis      Action/Plan: From home  Expected Discharge Date:   (unknown)               Expected Discharge Plan:  Home/Self Care  In-House Referral:     Discharge planning Services     Post Acute Care Choice:    Choice offered to:     DME Arranged:    DME Agency:     HH Arranged:    HH Agency:     Status of Service:  In process, will continue to follow  If discussed at Long Length of Stay Meetings, dates discussed:    Additional Comments:Date:  July 19, 2016 Chart reviewed for concurrent status and case management needs. Will continue to follow the patient for status change: Discharge Planning: following for needs Expected discharge date: [id number] Marcelle SmilingRhonda Neyland Pettengill, BSN, [place]3, [place]   [phone]  Golda Acreavis, Zoua Caporaso Lynn, RN 07/19/2016, 10:48 AM

## 2016-07-19 NOTE — Consult Note (Signed)
Ref: Sonda PrimesAlex Plotnikov, MD   Subjective:  Feeling better. Normal LV systolic function on Echocardiogram. T max 99.3 degree F. Troponin-I trending downward from level of 1.02.   Objective:  Vital Signs in the last 24 hours: Temp:  [98.1 F (36.7 C)-99.3 F (37.4 C)] 98.5 F (36.9 C) (10/19 1139) Pulse Rate:  [79-99] 94 (10/19 1139) Cardiac Rhythm: Normal sinus rhythm (10/19 1137) Resp:  [10-28] 18 (10/19 1139) BP: (85-151)/(41-85) 135/75 (10/19 1139) SpO2:  [90 %-97 %] 94 % (10/19 1139) Weight:  [63.6 kg (140 lb 3.4 oz)] 63.6 kg (140 lb 3.4 oz) (10/18 1814)  Physical Exam: BP Readings from Last 1 Encounters:  07/19/16 135/75    Wt Readings from Last 1 Encounters:  07/18/16 63.6 kg (140 lb 3.4 oz)    Weight change:  Body mass index is 23.33 kg/m. HEENT: [place]/AT, Eyes- PERL, EOMI, Conjunctiva-Pink, Sclera-Non-icteric Neck: No JVD, No bruit, Trachea midline. Lungs:  Clearing, Bilateral. Cardiac:  Regular rhythm, normal S1 and S2, no S3. II/VI systolic murmur. Abdomen:  Soft, non-tender. Extremities:  No edema present. No cyanosis. No clubbing. CNS: AxOx3, Cranial nerves grossly intact, moves all 4 extremities. Right handed. Skin: Warm and dry.   Intake/Output from previous day: 10/18 0701 - 10/19 0700 In: 2744 [I.V.:1645; IV Piggyback:1099] Out: 375 [Urine:375]    Lab Results: BMET    Component Value Date/Time   NA 133 (L) 07/19/2016 0652   NA 133 (L) 07/18/2016 1319   NA 134 (L) 12/28/2015 1413   K 4.2 07/19/2016 0652   K 4.0 07/18/2016 1319   K 4.6 12/28/2015 1413   CL 105 07/19/2016 0652   CL 95 (L) 07/18/2016 1319   CL 97 12/28/2015 1413   CO2 21 (L) 07/19/2016 0652   CO2 26 07/18/2016 1319   CO2 30 12/28/2015 1413   GLUCOSE 244 (H) 07/19/2016 0652   GLUCOSE 236 (H) 07/18/2016 1319   GLUCOSE 280 (H) 12/28/2015 1413   BUN 8 07/19/2016 0652   BUN 10 07/18/2016 1319   BUN 15 12/28/2015 1413   CREATININE 0.51 07/19/2016 0652   CREATININE 0.71 07/18/2016 1319    CREATININE 0.62 12/28/2015 1413   CALCIUM 7.3 (L) 07/19/2016 0652   CALCIUM 8.5 (L) 07/18/2016 1319   CALCIUM 9.4 12/28/2015 1413   GFRNONAA >60 07/19/2016 0652   GFRNONAA >60 07/18/2016 1319   GFRNONAA >90 06/25/2014 0724   GFRAA >60 07/19/2016 0652   GFRAA >60 07/18/2016 1319   GFRAA >90 06/25/2014 0724   CBC    Component Value Date/Time   WBC 19.0 (H) 07/19/2016 0652   RBC 4.34 07/19/2016 0652   HGB 12.9 07/19/2016 0652   HCT 39.0 07/19/2016 0652   PLT 201 07/19/2016 0652   MCV 89.9 07/19/2016 0652   MCH 29.7 07/19/2016 0652   MCHC 33.1 07/19/2016 0652   RDW 14.6 07/19/2016 0652   LYMPHSABS 1.1 07/18/2016 1319   MONOABS 1.4 (H) 07/18/2016 1319   EOSABS 0.0 07/18/2016 1319   BASOSABS 0.0 07/18/2016 1319   HEPATIC Function Panel  Recent Labs  07/18/16 1319  PROT 7.2   HEMOGLOBIN A1C No components found for: HGA1C,  MPG CARDIAC ENZYMES Lab Results  Component Value Date   CKTOTAL 57 01/12/2011   TROPONINI 0.37 (HH) 07/19/2016   TROPONINI 0.51 (HH) 07/19/2016   TROPONINI 0.92 (HH) 07/18/2016   BNP No results for input(s): PROBNP in the last 8760 hours. TSH  Recent Labs  12/28/15 1413  TSH 1.43   CHOLESTEROL No results  for input(s): CHOL in the last 8760 hours.  Scheduled Meds: . aspirin  81 mg Oral Daily  . aspirin  300 mg Rectal Once  . azithromycin  500 mg Intravenous Q24H  . carbidopa-levodopa  1 tablet Oral TID  . cefTRIAXone (ROCEPHIN)  IV  1 g Intravenous Q24H  . cholecalciferol  2,000 Units Oral Daily  . enoxaparin (LOVENOX) injection  40 mg Subcutaneous Q24H  . fesoterodine  8 mg Oral Daily  . fluconazole (DIFLUCAN) IV  100 mg Intravenous Q24H  . insulin aspart  0-15 Units Subcutaneous TID WC  . insulin aspart  0-5 Units Subcutaneous QHS  . insulin glargine  15 Units Subcutaneous Daily  . LORazepam  1-2 mg Oral TID  . methylPREDNISolone (SOLU-MEDROL) injection  40 mg Intravenous Q12H  . sodium chloride flush  3 mL Intravenous Q12H  .  [START ON 07/24/2016] Vitamin D (Ergocalciferol)  50,000 Units Oral Q7 days   Continuous Infusions: . sodium chloride 75 mL/hr at 07/19/16 0846   PRN Meds:.albuterol, HYDROmorphone, oxyCODONE, zolpidem  Assessment/Plan: Acute sepsis Possible bilateral bronchopneumonia Pharyngitis/Laryngitis with abscess Abnormal Troponin-I DM, II  Continue medical treatment for now.  Cardiac work-up on OP basis. Will follow as needed.   LOS: 1 day    Orpah Cobb  MD  07/19/2016, 5:41 PM

## 2016-07-19 NOTE — Progress Notes (Signed)
Patient received as transfer from ICU.  Agree with previous RN's assessment of patient. Patient placed on telemetry and confirmed with CMT, oriented to unit. Will continue to monitor.

## 2016-07-19 NOTE — Progress Notes (Signed)
  Echocardiogram 2D Echocardiogram has been performed.  Nolon RodBrown, Tony 07/19/2016, 11:28 AM

## 2016-07-19 NOTE — Progress Notes (Signed)
Report received from K.Oberry,RN. No change in assessment. Florene Brill Johnson 

## 2016-07-20 DIAGNOSIS — J189 Pneumonia, unspecified organism: Secondary | ICD-10-CM

## 2016-07-20 DIAGNOSIS — J029 Acute pharyngitis, unspecified: Secondary | ICD-10-CM

## 2016-07-20 LAB — CBC
HCT: 37.6 % (ref 36.0–46.0)
Hemoglobin: 12.8 g/dL (ref 12.0–15.0)
MCH: 29.4 pg (ref 26.0–34.0)
MCHC: 34 g/dL (ref 30.0–36.0)
MCV: 86.4 fL (ref 78.0–100.0)
Platelets: 243 10*3/uL (ref 150–400)
RBC: 4.35 MIL/uL (ref 3.87–5.11)
RDW: 14.3 % (ref 11.5–15.5)
WBC: 21.5 10*3/uL — ABNORMAL HIGH (ref 4.0–10.5)

## 2016-07-20 LAB — URINE CULTURE: Culture: NO GROWTH

## 2016-07-20 LAB — CULTURE, GROUP A STREP (THRC)

## 2016-07-20 LAB — GLUCOSE, CAPILLARY
Glucose-Capillary: 208 mg/dL — ABNORMAL HIGH (ref 65–99)
Glucose-Capillary: 181 mg/dL — ABNORMAL HIGH (ref 65–99)
Glucose-Capillary: 188 mg/dL — ABNORMAL HIGH (ref 65–99)
Glucose-Capillary: 176 mg/dL — ABNORMAL HIGH (ref 65–99)

## 2016-07-20 MED ORDER — METOPROLOL TARTRATE 25 MG PO TABS
12.5000 mg | ORAL_TABLET | Freq: Two times a day (BID) | ORAL | Status: DC
  Administered 2016-07-20 – 2016-07-22 (×4): 12.5 mg via ORAL
  Filled 2016-07-20 (×4): qty 1

## 2016-07-20 MED ORDER — MAGIC MOUTHWASH W/LIDOCAINE
5.0000 mL | Freq: Four times a day (QID) | ORAL | Status: DC | PRN
  Administered 2016-07-20: 5 mL via ORAL
  Filled 2016-07-20 (×2): qty 5

## 2016-07-20 NOTE — Consult Note (Signed)
Ref: Sonda PrimesAlex Plotnikov, MD   Subjective:  No chest pain. Not happy with pain at IV site over right deltoid area.  Objective:  Vital Signs in the last 24 hours: Temp:  [97.9 F (36.6 C)-99.3 F (37.4 C)] 98.2 F (36.8 C) (10/20 1414) Pulse Rate:  [80-90] 80 (10/20 1414) Cardiac Rhythm: Normal sinus rhythm (10/20 0700) Resp:  [18-20] 18 (10/20 1414) BP: (136-145)/(68-80) 136/70 (10/20 1414) SpO2:  [91 %-96 %] 91 % (10/20 1414)  Physical Exam: BP Readings from Last 1 Encounters:  07/20/16 136/70    Wt Readings from Last 1 Encounters:  07/18/16 63.6 kg (140 lb 3.4 oz)    Weight change:  Body mass index is 23.33 kg/m. HEENT: [place]/AT, Eyes-PERL, EOMI, Conjunctiva-Pink, Sclera-Non-icteric Neck: No JVD, No bruit, Trachea midline. Decrease in size of right submandibular and anterior neck area abscess by 50 %. Lungs:  Clear, Bilateral. Cardiac:  Regular rhythm, normal S1 and S2, no S3. II/VI systolic murmur. Abdomen:  Soft, non-tender. Extremities:  No edema present. No cyanosis. No clubbing. CNS: AxOx3, Cranial nerves grossly intact, moves all 4 extremities. Right handed. Skin: Warm and dry.   Intake/Output from previous day: 10/19 0701 - 10/20 0700 In: 1831.3 [P.O.:250; I.V.:1231.3; IV Piggyback:350] Out: 2050 [Urine:2050]    Lab Results: BMET    Component Value Date/Time   NA 133 (L) 07/19/2016 0652   NA 133 (L) 07/18/2016 1319   NA 134 (L) 12/28/2015 1413   K 4.2 07/19/2016 0652   K 4.0 07/18/2016 1319   K 4.6 12/28/2015 1413   CL 105 07/19/2016 0652   CL 95 (L) 07/18/2016 1319   CL 97 12/28/2015 1413   CO2 21 (L) 07/19/2016 0652   CO2 26 07/18/2016 1319   CO2 30 12/28/2015 1413   GLUCOSE 244 (H) 07/19/2016 0652   GLUCOSE 236 (H) 07/18/2016 1319   GLUCOSE 280 (H) 12/28/2015 1413   BUN 8 07/19/2016 0652   BUN 10 07/18/2016 1319   BUN 15 12/28/2015 1413   CREATININE 0.51 07/19/2016 0652   CREATININE 0.71 07/18/2016 1319   CREATININE 0.62 12/28/2015 1413   CALCIUM  7.3 (L) 07/19/2016 0652   CALCIUM 8.5 (L) 07/18/2016 1319   CALCIUM 9.4 12/28/2015 1413   GFRNONAA >60 07/19/2016 0652   GFRNONAA >60 07/18/2016 1319   GFRNONAA >90 06/25/2014 0724   GFRAA >60 07/19/2016 0652   GFRAA >60 07/18/2016 1319   GFRAA >90 06/25/2014 0724   CBC    Component Value Date/Time   WBC 21.5 (H) 07/20/2016 0543   RBC 4.35 07/20/2016 0543   HGB 12.8 07/20/2016 0543   HCT 37.6 07/20/2016 0543   PLT 243 07/20/2016 0543   MCV 86.4 07/20/2016 0543   MCH 29.4 07/20/2016 0543   MCHC 34.0 07/20/2016 0543   RDW 14.3 07/20/2016 0543   LYMPHSABS 1.1 07/18/2016 1319   MONOABS 1.4 (H) 07/18/2016 1319   EOSABS 0.0 07/18/2016 1319   BASOSABS 0.0 07/18/2016 1319   HEPATIC Function Panel  Recent Labs  07/18/16 1319  PROT 7.2   HEMOGLOBIN A1C No components found for: HGA1C,  MPG CARDIAC ENZYMES Lab Results  Component Value Date   CKTOTAL 57 01/12/2011   TROPONINI 0.37 (HH) 07/19/2016   TROPONINI 0.51 (HH) 07/19/2016   TROPONINI 0.92 (HH) 07/18/2016   BNP No results for input(s): PROBNP in the last 8760 hours. TSH  Recent Labs  12/28/15 1413  TSH 1.43   CHOLESTEROL No results for input(s): CHOL in the last 8760 hours.  Scheduled Meds: . aspirin  81 mg Oral Daily  . azithromycin  500 mg Intravenous Q24H  . carbidopa-levodopa  1 tablet Oral TID  . cefTRIAXone (ROCEPHIN)  IV  1 g Intravenous Q24H  . cholecalciferol  2,000 Units Oral Daily  . enoxaparin (LOVENOX) injection  40 mg Subcutaneous Q24H  . fesoterodine  8 mg Oral Daily  . fluconazole (DIFLUCAN) IV  100 mg Intravenous Q24H  . insulin aspart  0-15 Units Subcutaneous TID WC  . insulin aspart  0-5 Units Subcutaneous QHS  . insulin glargine  15 Units Subcutaneous Daily  . LORazepam  1-2 mg Oral TID  . sodium chloride flush  3 mL Intravenous Q12H  . [START ON 07/24/2016] Vitamin D (Ergocalciferol)  50,000 Units Oral Q7 days   Continuous Infusions: . sodium chloride 50 mL/hr at 07/20/16 1300    PRN Meds:.albuterol, HYDROmorphone, magic mouthwash w/lidocaine, oxyCODONE, phenol, zolpidem  Assessment/Plan: Acute sepsis Possible bilateral bronchopneumonia Acute pharyngitis/laryngitis with abscess Abnormal troponin-I DM, II  Patient aware of need for cardiac work up post clearance of abscess.   LOS: 2 days    Orpah Cobb  MD  07/20/2016, 6:57 PM

## 2016-07-20 NOTE — Progress Notes (Signed)
Inpatient Diabetes Program Recommendations  AACE/ADA: New Consensus Statement on Inpatient Glycemic Control (2015)  Target Ranges:  Prepandial:   less than 140 mg/dL      Peak postprandial:   less than 180 mg/dL (1-2 hours)      Critically ill patients:  140 - 180 mg/dL   Results for [name], [name] (MRN [id number]) as of 07/20/2016 08:28  Ref. Range 07/19/2016 07:55 07/19/2016 11:47 07/19/2016 14:24 07/19/2016 22:51  Glucose-Capillary Latest Ref Range: 65 - 99 mg/dL 086251 (H) 578205 (H) 469185 (H) 204 (H)   Results for [name], [name] (MRN [id number]) as of 07/20/2016 08:28  Ref. Range 07/20/2016 07:10  Glucose-Capillary Latest Ref Range: 65 - 99 mg/dL 244208 (H)    Home DM Meds: Metformin 500 TID  Current Orders: Lantus 15 units daily       Novolog Moderate Correction Scale/ SSI (0-15 units) TID AC + HS     -Note patient currently receiving IV Solumedrol 40 mg BID.  -Lantus started yesterday AM for better glucose control.  -Fasting glucose still elevated this AM.     MD- Please consider increasing Lantus to 20 units daily while patient continues on IV steroids (~0.3 units/kg dosing)      --Will follow patient during hospitalization--  Ambrose FinlandJeannine Johnston Carletha Dawn RN, MSN, CDE Diabetes Coordinator Inpatient Glycemic Control Team Team Pager: [phone] (8a-5p)

## 2016-07-20 NOTE — Progress Notes (Signed)
PROGRESS NOTE                                                                                                                                                                                                             Patient Demographics:    [name], is a 65 y.o. female, DOB - [birth year], ZOX:[id number]  Admit date - 07/18/2016   Admitting Physician Eddie NorthNishant Dhungel, MD  Outpatient Primary MD for the patient is Sonda PrimesAlex Plotnikov, MD  LOS - 2  Outpatient Specialists:None  Chief Complaint  Patient presents with  . Oral Swelling  . 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 She was prescribed Levaquin at the urgent care with diagnosis of lower extremity infection one day back. She returned to the ED with worsened symptoms where she was found to be septic with systolic blood pressure in the 70s, tachypnea, fever of 102F,   significant leukocytosis (27K), and elevated lactic acid. Also had markedly elevated troponin. CT of the neck showing pharyngitis and laryngitis with pharyngeal edema and esophagitis. CT of the chest showing bilateral lower lobe infiltrate. Admitted to stepdown unit for sepsis.   Subjective:   Reports she is feeling better today , febrile over last 24 hours, Claritin clear liquid diet .   Assessment  & Plan :   Sepsis (HCC) - Secondary to combination of pharyngitis/laryngitis with pharyngeal edema and lobar pneumonia. - No signs of abscess on admission CT. - Sepsis improving with aggressive IV hydration, empiric antibiotics.  Afebrile this morning. He mentally leukocytosis, but I think currently is most likely  related to steroids. - Blood culture so far negative. - Continue empiric Rocephin and azithromycin. Supportive care with pain medications and Tylenol.  Pharyngitis/laryngitis with pharyngeal edema - Symptoms of dysphagia/odynophagia with nonproductive cough. - Initially on IV antibiotic and IV Solu-Medrol 40 mg twice a day. ED physician discussed CT findings with ENT Dr Lazarus SalinesWolicki, who recommended this was unlikely epiglottitis or contributing to respiratory compromise. Recommended antibiotics, steroids and monitoring, appears to be improving, will discontinue IV steroids, continue with IV  antibiotics - Clear liquid diet, advanced to full liquid diet today  multilobar pneumonia - Empiric antibiotics as above. Follow cultures.  Oral thrush possible esophageal candidiasis/esophagitis - CT shows esophageal thickening with possible esophagitis. Added fluconazole and PPI. We'll start on Magic mouthwash.  DM2 (diabetes mellitus, type 2) (HCC) - Hold metformin. A1c of 8.8. Added Lantus while patient is on steroid. Continue sliding scale coverage. Will hold increasing Lantus today as will stop steroids  CVA with residual left-sided weakness - Mostly wheelchair-bound. As well as embolus with walker, Continue aspirin.  Elevated troponin - No chest pain symptoms or EKG changes. Suspect demand ischemia.  serial troponins none ACS pattern  (1>>0.9>>0.5). 2-D echo EF 55-60%, with no regional wall motion abnormality, neurologic consult appreciated,cardiac  workup on outpatient basis.  Tobacco use Counseled on cessation.  Chronic back pain - Stable on home dose Dilaudid  Acute urinary retention - Required Foley catheter. Check renal ultrasound, voiding trial today after ambultation.      Code Status : Full code  Family Communication  : None at bedside  Disposition Plan  : home possibly in the next 48 hours if symptoms improve.  Barriers For Discharge : Active symptoms  Consults  :    ENT ( Dr Lazarus Salines) consulted on the phone by ED physician Cardiology Dr. Algie Coffer  Procedures  :  CT neck and chest 2-D echo  DVT Prophylaxis  :  Lovenox -  Lab Results  Component Value Date   PLT 243 07/20/2016    Antibiotics  :    Anti-infectives    Start     Dose/Rate Route Frequency Ordered Stop   07/19/16 1700  azithromycin (ZITHROMAX) 500 mg in dextrose 5 % 250 mL IVPB     500 mg 250 mL/hr over 60 Minutes Intravenous Every 24 hours 07/18/16 1814     07/19/16 1600  cefTRIAXone (ROCEPHIN) 1 g in dextrose 5 % 50 mL IVPB     1 g 100 mL/hr over 30 Minutes Intravenous Every 24 hours 07/18/16 1814     07/18/16 2000  fluconazole (DIFLUCAN) IVPB 100 mg     100 mg 50 mL/hr over 60 Minutes Intravenous Every 24 hours 07/18/16 1813 07/21/16 1959   07/18/16 1500  cefTRIAXone (ROCEPHIN) 1 g in dextrose 5 % 50 mL IVPB     1 g 100 mL/hr over 30 Minutes Intravenous  Once 07/18/16 1449 07/18/16 1654   07/18/16 1500  azithromycin (ZITHROMAX) 500 mg in dextrose 5 % 250 mL IVPB     500 mg 250 mL/hr over 60 Minutes Intravenous  Once 07/18/16 1449 07/18/16 1751        Objective:   Vitals:   07/19/16 2014 07/19/16 2200 07/20/16 0032 07/20/16 0525  BP: 137/68 (!) 145/80 (!) 142/80 (!) 143/72  Pulse: 88  90 82  Resp: 20  20 18   Temp: 99.3 F (37.4 C)  98.5 F (36.9 C) 97.9 F (36.6 C)  TempSrc: Oral  Oral Oral  SpO2: 96%  95% 93%  Weight:      Height:        Wt Readings from Last 3 Encounters:  07/18/16 63.6 kg (140 lb 3.4 oz)  07/02/16 61.7 kg (136 lb)  05/07/16 61.5 kg (135 lb 8 oz)     Intake/Output Summary (Last 24 hours) at 07/20/16 1222 Last data filed at 07/20/16 0900  Gross per 24 hour  Intake             1638 ml  Output             1650 ml  Net              -12 ml     Physical Exam  Gen: Appears fatigued, not in distress HEENT: no pallor, Significantly decreased swelling in , did not appreciate an oral thrush today , mild  erythema on posterior pharyngeal  wall Chest: Diminished bibasilar breath sounds CVS: N S1&S2, no murmurs, rubs or gallop GI: soft, NT, ND, BS+, Foley placed on admission. Musculoskeletal: warm, no edema CNS: Alert and oriented    Data Review:    CBC  Recent Labs Lab 07/18/16 1319 07/19/16 0652 07/20/16 0543  WBC 27.0* 19.0* 21.5*  HGB 14.6 12.9 12.8  HCT 44.6 39.0 37.6  PLT 230 201 243  MCV 91.0 89.9 86.4  MCH 29.8 29.7 29.4  MCHC 32.7 33.1 34.0  RDW 14.8 14.6 14.3  LYMPHSABS 1.1  --   --   MONOABS 1.4*  --   --   EOSABS 0.0  --   --   BASOSABS 0.0  --   --     Chemistries   Recent Labs Lab 07/18/16 1319 07/19/16 0652  NA 133* 133*  K 4.0 4.2  CL 95* 105  CO2 26 21*  GLUCOSE 236* 244*  BUN 10 8  CREATININE 0.71 0.51  CALCIUM 8.5* 7.3*  AST 18  --   ALT 8*  --   ALKPHOS 113  --   BILITOT 1.2  --    ------------------------------------------------------------------------------------------------------------------ No results for input(s): CHOL, HDL, LDLCALC, TRIG, CHOLHDL, LDLDIRECT in the last 72 hours.  Lab Results  Component Value Date   HGBA1C 8.8 (H) 12/28/2015   ------------------------------------------------------------------------------------------------------------------ No results for input(s): TSH, T4TOTAL, T3FREE, THYROIDAB in the last 72 hours.  Invalid input(s): FREET3 ------------------------------------------------------------------------------------------------------------------ No results for input(s): VITAMINB12, FOLATE, FERRITIN, TIBC, IRON, RETICCTPCT in the last 72 hours.  Coagulation profile  Recent Labs Lab 07/18/16 1725  INR 1.10    No results for input(s): DDIMER in the last 72 hours.  Cardiac Enzymes  Recent Labs Lab 07/18/16 1811 07/19/16 0009 07/19/16 0652  TROPONINI 0.92* 0.51* 0.37*   ------------------------------------------------------------------------------------------------------------------    Component Value Date/Time   BNP  333.6 (H) 07/18/2016 1327    Inpatient Medications  Scheduled Meds: . aspirin  81 mg Oral Daily  . azithromycin  500 mg Intravenous Q24H  . carbidopa-levodopa  1 tablet Oral TID  . cefTRIAXone (ROCEPHIN)  IV  1 g Intravenous Q24H  . cholecalciferol  2,000 Units Oral Daily  . enoxaparin (LOVENOX) injection  40 mg Subcutaneous Q24H  . fesoterodine  8 mg Oral Daily  . fluconazole (DIFLUCAN) IV  100 mg Intravenous Q24H  . insulin aspart  0-15 Units Subcutaneous TID WC  . insulin aspart  0-5 Units Subcutaneous QHS  . insulin glargine  15 Units Subcutaneous Daily  . LORazepam  1-2 mg Oral TID  . sodium chloride flush  3 mL Intravenous Q12H  . [START ON 07/24/2016] Vitamin D (Ergocalciferol)  50,000 Units Oral Q7 days   Continuous Infusions: . sodium chloride 30 mL/hr at 07/19/16 1744   PRN Meds:.albuterol, HYDROmorphone, magic mouthwash w/lidocaine, oxyCODONE, phenol, zolpidem  Micro Results Recent Results (from the past 240 hour(s))  Blood Culture (routine x 2)     Status: None (Preliminary result)   Collection Time: 07/18/16  1:19 PM  Result Value Ref Range Status   Specimen Description BLOOD LW  Final   Special  Requests BOTTLES DRAWN AEROBIC AND ANAEROBIC 5CC  Final   Culture   Final    NO GROWTH 2 DAYS Performed at [facility]    Report Status PENDING  Incomplete  Blood Culture (routine x 2)     Status: None (Preliminary result)   Collection Time: 07/18/16  2:19 PM  Result Value Ref Range Status   Specimen Description RIGHT ANTECUBITAL  Final   Special Requests BOTTLES DRAWN AEROBIC ONLY 5CC  Final   Culture   Final    NO GROWTH 2 DAYS Performed at [facility]    Report Status PENDING  Incomplete  Rapid strep screen (not at [facility])     Status: None   Collection Time: 07/18/16  5:11 PM  Result Value Ref Range Status   Streptococcus, Group A Screen (Direct) NEGATIVE NEGATIVE Final    Comment: (NOTE) A Rapid Antigen test may result negative if the antigen  level in the sample is below the detection level of this test. The FDA has not cleared this test as a stand-alone test therefore the rapid antigen negative result has reflexed to a Group A Strep culture.   Culture, group A strep     Status: None   Collection Time: 07/18/16  5:11 PM  Result Value Ref Range Status   Specimen Description THROAT  Final   Special Requests NONE  Final   Culture FEW STREPTOCOCCUS,BETA HEMOLYTIC NOT GROUP A  Final   Report Status 07/20/2016 FINAL  Final  MRSA PCR Screening     Status: None   Collection Time: 07/18/16  6:06 PM  Result Value Ref Range Status   MRSA by PCR NEGATIVE NEGATIVE Final    Comment:        The GeneXpert MRSA Assay (FDA approved for NASAL specimens only), is one component of a comprehensive MRSA colonization surveillance program. It is not intended to diagnose MRSA infection nor to guide or monitor treatment for MRSA infections.   Urine culture     Status: None   Collection Time: 07/19/16  3:16 AM  Result Value Ref Range Status   Specimen Description URINE, RANDOM  Final   Special Requests NONE  Final   Culture   Final    NO GROWTH 1 DAY Performed at [facility]    Report Status 07/20/2016 FINAL  Final    Radiology Reports Dg Chest 2 View  Result Date: 07/17/2016 CLINICAL DATA:  Cough, fever and sore throat. EXAM: CHEST  2 VIEW COMPARISON:  06/24/2014 chest radiograph FINDINGS: The cardiomediastinal silhouette is unremarkable. Subsegmental right lower lung atelectasis noted. There is no evidence of focal airspace disease, pulmonary edema, suspicious pulmonary nodule/mass, pleural effusion, or pneumothorax. No acute bony abnormalities are identified. IMPRESSION: Subsegmental right lower lung atelectasis. No evidence of focal airspace disease/ pneumonia. Electronically Signed   By: Harmon Pier M.D.   On: 07/17/2016 11:33   Ct Soft Tissue Neck W Contrast  Result Date: 07/18/2016 CLINICAL DATA:  65 year old female  with increased shortness of breath cough and oral swelling since yesterday. Diagnosed with URI. Initial encounter. EXAM: CT NECK WITH CONTRAST TECHNIQUE: Multidetector CT imaging of the neck was performed using the standard protocol following the bolus administration of intravenous contrast. CONTRAST:  75mL ISOVUE-300 IOPAMIDOL (ISOVUE-300) INJECTION 61% COMPARISON:  Brain MRI 03/30/2011. FINDINGS: Pharynx and larynx: Generalized pharyngeal mucosal space thickening involving the oropharynx, hypopharynx, and supraglottic larynx. There is mild mucosal hyper enhancement of the nasopharynx which otherwise appears normal. There is mild  thickening of the epiglottis (series 7, image 54). Up to mild associated soft tissue stranding in both parapharyngeal spaces. Trace retropharyngeal effusion. There also appears to be mucosal hyper enhancement of the proximal cervical esophagus (sagittal image 45). The level of the glottis and subglottic trachea appear normal. Salivary glands: Negative sublingual space. Negative submandibular glands and parotid glands. Thyroid: Negative. Lymph nodes: Hyperenhancing bilateral cervical lymph nodes. Bilateral level 2 lymph nodes are mildly enlarged up to 11 mm short axis. Bilateral level 1 B nodes appear heterogeneous and enlarged up to 10 mm short axis. No cystic or necrotic nodes are identified. Tiny retropharyngeal nodes also are hyper enhancing. Vascular: Major vascular structures in the neck and at the skullbase are patent. Carotid atherosclerosis in the neck without stenosis. Limited intracranial: Negative. Visualized orbits: Negative. Mastoids and visualized paranasal sinuses: Visualized paranasal sinuses and mastoids are stable and well pneumatized. Skeleton: Dental periapical lucency at the posterior right mandible molar. No other acute dental findings. Average for age cervical spine degeneration. No acute osseous abnormality identified. Upper chest: Reported separately today.  Lung  apices are clear. Other: None. IMPRESSION: 1. Generalized pharyngitis and laryngitis with pharyngeal mucosal space edema including mild involvement of the epiglottis. Mild parapharyngeal and retropharyngeal inflammation. Mucosal hyper enhancement at the adenoids and also in the cervical esophagus - consider upper esophagitis. 2. Reactive appearing hyper enhancing cervical lymph nodes diffusely, maximal at the level 1 and level 2 stations. 3. No cystic or necrotic nodes.  No neck abscess. 4. Chest CT today reported separately. Electronically Signed   By: Odessa Fleming M.D.   On: 07/18/2016 15:23   Ct Chest W Contrast  Result Date: 07/18/2016 CLINICAL DATA:  Upper respiratory infection diagnosed yesterday. Worsening swelling and difficulty eating. Shortness of breath and cough. EXAM: CT CHEST WITH CONTRAST TECHNIQUE: Multidetector CT imaging of the chest was performed during intravenous contrast administration. CONTRAST:  75mL ISOVUE-300 IOPAMIDOL (ISOVUE-300) INJECTION 61% COMPARISON:  Chest radiography 07/17/2016 FINDINGS: Cardiovascular: There is aortic atherosclerosis. No aneurysm or dissection. Heart size is normal. No pericardial fluid. The study was not done as a pulmonary arteriogram study, but no pulmonary emboli are identified. Mediastinum/Nodes: No pathologically enlarged hilar or mediastinal lymph nodes. No esophageal mass. There could be wall thickening of the upper thoracic esophagus as might be seen with esophagitis. Lungs/Pleura: Background pattern of mild emphysema. There is patchy infiltrate in the posterior left lower lobe consistent with mild bronchopneumonia. There is more extensive patchy density in volume loss in the right lower lobe and minimal involvement of the right middle lobe consistent with bronchopneumonia. No pleural effusion. Upper Abdomen: No acute finding. Musculoskeletal: Bilateral breast implants.  No spinal abnormality. IMPRESSION: Patchy pulmonary density consistent with  bronchopneumonia, most pronounced in the right lower lobe but also affecting the left lower lobe and to a minimal extent the right middle lobe. No pleural effusion.  No lymphadenopathy. Question upper thoracic esophageal wall thickening that could go along with esophagitis. This is not definite. Electronically Signed   By: Paulina Fusi M.D.   On: 07/18/2016 15:20   US Renal  Result Date: 07/19/2016 CLINICAL DATA:  Acute urinary retention EXAM: RENAL / URINARY TRACT ULTRASOUND COMPLETE COMPARISON:  None. FINDINGS: Right Kidney: Length: 11.2 cm. Echogenicity within normal limits. No mass or hydronephrosis visualized. Left Kidney: Length: 10.7 cm.  1.6 cm cyst is noted in the upper pole Bladder: Decompressed by Foley catheter. Note is made of a tiny amount of free fluid in the right abdomen adjacent to  the right kidney. IMPRESSION: Left renal cyst. Minimal trace ascites. Electronically Signed   By: Alcide Clever M.D.   On: 07/19/2016 15:28   Dexascan  Result Date: 07/08/2016 Date of study: 07/02/16 Exam: DUAL X-RAY ABSORPTIOMETRY (DXA) FOR BONE MINERAL DENSITY (BMD) Instrument: [employer] Requesting Provider: PCP Indication: screening for osteoporosis Comparison: none (please note that it is not possible to compare data from different instruments) Clinical data: Pt is a 65 y.o. female without history of fracture. Results:  Lumbar spine (L1-L4) Femoral neck (FN) 33% distal radius T-score - 1.2 RFN: - 2.5 LFN: - 1.5 n/a Change in BMD from previous DXA test (%) Down 5.8% Down 5.5% n/a (*) statistically significant Assessment: Patient has OSTEOPOROSIS according to the [facility] classification for osteoporosis (see below). Fracture risk: high Comments: the technical quality of the study is good Evaluation for secondary causes should be considered if clinically indicated. Recommend optimizing calcium (1200 mg/day) and vitamin D (800 IU/day). Followup: Repeat BMD is appropriate after 2 years or after 1-2 years if  starting treatment. WHO criteria for diagnosis of osteoporosis in postmenopausal women and in men 52 y/o or older: - normal: T-score -1.0 to + 1.0 - osteopenia/low bone density: T-score between -2.5 and -1.0 - osteoporosis: T-score below -2.5 - severe osteoporosis: T-score below -2.5 with history of fragility fracture Note: although not part of the WHO classification, the presence of a fragility fracture, regardless of the T-score, should be considered diagnostic of osteoporosis, provided other causes for the fracture have been excluded. Treatment: The National Osteoporosis Foundation recommends that treatment be considered in postmenopausal women and men age 78 or older with: 1. Hip or vertebral (clinical or morphometric) fracture 2. T-score of - 2.5 or lower at the spine or hip 3. 10-year fracture probability by FRAX of at least 20% for a major osteoporotic fracture and 3% for a hip fracture Roxy Manns MD   Dg Chest Port 1 View  Result Date: 07/19/2016 CLINICAL DATA:  Shortness of breath. EXAM: PORTABLE CHEST 1 VIEW COMPARISON:  CT 07/18/2016. FINDINGS: Persistent mild bibasilar infiltrates, unchanged from prior CT. Heart size stable. Small left pleural effusion cannot be excluded. No pneumothorax. IMPRESSION: Persistent mild bibasilar infiltrates, unchanged from prior CT of 07/18/2016. Electronically Signed   By: Maisie Fus  Register   On: 07/19/2016 06:49     Randol Kern, Fartun Paradiso M.D on 07/20/2016 at 12:22 PM  Between 7am to 7pm - Pager - [phone]  After 7pm go to [email or web address] - password [facility]  [facility] -  Office  [phone]

## 2016-07-20 NOTE — Progress Notes (Signed)
Pt c/o pain at  IV site.site is warm Will d/c

## 2016-07-20 NOTE — Plan of Care (Signed)
Problem: Safety: Goal: Ability to remain free from injury will improve Outcome: Completed/Met Date Met: 07/20/16 Discussed safety prevention/precaution plan. Pt is aware to call when needing to get oob

## 2016-07-21 LAB — CBC
HCT: 36.3 % (ref 36.0–46.0)
Hemoglobin: 12.7 g/dL (ref 12.0–15.0)
MCH: 29.7 pg (ref 26.0–34.0)
MCHC: 35 g/dL (ref 30.0–36.0)
MCV: 85 fL (ref 78.0–100.0)
Platelets: 255 10*3/uL (ref 150–400)
RBC: 4.27 MIL/uL (ref 3.87–5.11)
RDW: 14.5 % (ref 11.5–15.5)
WBC: 10.3 10*3/uL (ref 4.0–10.5)

## 2016-07-21 LAB — BASIC METABOLIC PANEL
Anion gap: 8 (ref 5–15)
BUN: 8 mg/dL (ref 6–20)
CO2: 23 mmol/L (ref 22–32)
Calcium: 7.8 mg/dL — ABNORMAL LOW (ref 8.9–10.3)
Chloride: 106 mmol/L (ref 101–111)
Creatinine, Ser: 0.45 mg/dL (ref 0.44–1.00)
GFR calc Af Amer: >60 mL/min (ref >60)
GFR calc non Af Amer: >60 mL/min (ref >60)
Glucose, Bld: 105 mg/dL — ABNORMAL HIGH (ref 65–99)
Potassium: 3.6 mmol/L (ref 3.5–5.1)
Sodium: 137 mmol/L (ref 135–145)

## 2016-07-21 LAB — GLUCOSE, CAPILLARY
Glucose-Capillary: 101 mg/dL — ABNORMAL HIGH (ref 65–99)
Glucose-Capillary: 113 mg/dL — ABNORMAL HIGH (ref 65–99)
Glucose-Capillary: 141 mg/dL — ABNORMAL HIGH (ref 65–99)
Glucose-Capillary: 158 mg/dL — ABNORMAL HIGH (ref 65–99)

## 2016-07-21 NOTE — Evaluation (Signed)
Physical Therapy Evaluation Patient Details Name: [name] MRN: [id number] DOB: 09-23-1951 Today's Date: 07/21/2016   History of Present Illness  [name]  is a 65 y.o. female,With a history of CVA with residual left-sided weakness, mostly wheelchair-bound, hypertension, chronic low back pain, anxiety, who presented to the ED 07/18/16  with 2 day history of shortness of breath with increase swelling in her throat with dysphagia.  To urgent care 10/17and was told she had upper respiratory infection . Her neck swelling started to get worse and was increasingly short of breath. Course in the ED patient was septic with fever of 102.34F, tachycardic, tachypneic and hypotensive . Blood work showed significant leukocytosis, glucose of 236 and elevated troponin of 1.02    CT soft tissue of the neck showed generalized pharyngitis and laryngitis   Clinical Impression  The patient mobilized  To standing and began to ambulate and c/o dizziness. BP 156/83. Pt admitted with above diagnosis. Pt currently with functional limitations due to the deficits listed below (see PT Problem List). Pt will benefit from skilled PT to increase their independence and safety with mobility to allow discharge to the venue listed below.      Follow Up Recommendations Home health PT;Supervision/Assistance - 24 hour    Equipment Recommendations  None recommended by PT    Recommendations for Other Services       Precautions / Restrictions Precautions Precautions: Fall Restrictions Weight Bearing Restrictions: No      Mobility  Bed Mobility Overal bed mobility: Needs Assistance Bed Mobility: Supine to Sit     Supine to sit: Supervision     General bed mobility comments: able to get self to sitting up.  Transfers Overall transfer level: Needs assistance   Transfers: Sit to/from Stand Sit to Stand: Mod assist         General transfer comment: extra time to rise, steady assist on first stand, able to  stand easier second time  Ambulation/Gait                Stairs            Wheelchair Mobility    Modified Rankin (Stroke Patients Only)       Balance Overall balance assessment: History of Falls;Needs assistance Sitting-balance support: Feet supported;No upper extremity supported Sitting balance-Leahy Scale: Fair     Standing balance support: During functional activity;Bilateral upper extremity supported Standing balance-Leahy Scale: Poor                               Pertinent Vitals/Pain Pain Assessment: No/denies pain    Home Living Family/patient expects to be discharged to:: Private residence Living Arrangements: Spouse/significant other Available Help at Discharge: Family Type of Home: House Home Access: Ramped entrance     Home Layout: One level Home Equipment: Environmental consultantWalker - 2 wheels;Bedside commode;Wheelchair - manual Additional Comments: patient reports home alone during day, uses WC mostly    Prior Function Level of Independence: Needs assistance   Gait / Transfers Assistance Needed: short distance walk with RW. uses WC           Hand Dominance        Extremity/Trunk Assessment   Upper Extremity Assessment: LUE deficits/detail       LUE Deficits / Details: has active movement throughout UE with decreased coordination   Lower Extremity Assessment: LLE deficits/detail   LLE Deficits / Details: decreased dorsiflexion, has movement  through  out  Cervical / Trunk Assessment: Kyphotic  Communication      Cognition Arousal/Alertness: Awake/alert Behavior During Therapy: WFL for tasks assessed/performed   Area of Impairment: Orientation Orientation Level: Time                  General Comments      Exercises     Assessment/Plan    PT Assessment Patient needs continued PT services  PT Problem List Decreased strength;Decreased activity tolerance;Decreased balance;Decreased mobility;Pain;Decreased knowledge  of use of DME;Decreased cognition          PT Treatment Interventions DME instruction;Gait training;Functional mobility training;Therapeutic activities;Patient/family education    PT Goals (Current goals can be found in the Care Plan section)  Acute Rehab PT Goals Patient Stated Goal: to go home tomorrow PT Goal Formulation: With patient Time For Goal Achievement: 08/04/16 Potential to Achieve Goals: Good    Frequency Min 3X/week   Barriers to discharge        Co-evaluation               End of Session   Activity Tolerance: Patient limited by fatigue Patient left: in chair;with call bell/phone within reach;with chair alarm set Nurse Communication: Mobility status         Time: 1610-9604 PT Time Calculation (min) (ACUTE ONLY): 24 min   Charges:   PT Evaluation $PT Eval Low Complexity: 1 Procedure PT Treatments $Therapeutic Activity: 8-22 mins   PT G Codes:        Sharen Heck PT [phone]  07/21/2016, 3:58 PM

## 2016-07-21 NOTE — Progress Notes (Signed)
PROGRESS NOTE                                                                                                                                                                                                             Patient Demographics:    [name], is a 65 y.o. female, DOB - 1951-03-15, ZOX:[id number]  Admit date - 07/18/2016   Admitting Physician Eddie North, MD  Outpatient Primary MD for the patient is [name], MD  LOS - 3  Outpatient Specialists:None  Chief Complaint  Patient presents with  . Oral Swelling  . 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 She was prescribed Levaquin at the urgent care with diagnosis of lower extremity infection one day back. She returned to the ED with worsened symptoms where she was found to be septic with systolic blood pressure in the 70s, tachypnea, fever of 102F,   significant leukocytosis (27K), and elevated lactic acid. Also had markedly elevated troponin. CT of the neck showing pharyngitis and laryngitis with pharyngeal edema and esophagitis. CT of the chest showing bilateral lower lobe infiltrate. Admitted to stepdown unit for sepsis.   Subjective:   Reports She is feeling much better today, denies any throat pain, tolerating full liquid diet, asking if we can advance her diet.   Assessment  & Plan :   Sepsis (HCC) - Secondary to combination of pharyngitis/laryngitis with pharyngeal edema and lobar pneumonia. - No signs of abscess on admission CT. - Sepsis improving with aggressive IV hydration, empiric antibiotics.  Afebrile this morning. He mentally leukocytosis, but  I think currently is most likely related to steroids. - Blood culture so far negative. - Continue empiric Rocephin and azithromycin. Supportive care with pain medications and Tylenol.  Pharyngitis/laryngitis with pharyngeal edema - Symptoms of dysphagia/odynophagia with nonproductive cough. - Initially on IV antibiotic and IV Solu-Medrol 40 mg twice a day. ED physician discussed CT findings with ENT Dr Lazarus Salines, who recommended this was unlikely epiglottitis or contributing to respiratory compromise. Recommended antibiotics, steroids and monitoring, appears to be improving,  IV  steroids stopped on 10/20 , continue with IV antibiotics - advance to Soft diet today.  multilobar pneumonia - Empiric antibiotics as above. Follow cultures.  Oral thrush possible esophageal candidiasis/esophagitis - CT shows esophageal thickening with possible esophagitis. Added fluconazole and PPI. Continue  Magic mouthwash.  DM2 (diabetes mellitus, type 2) (HCC) - Hold metformin. A1c of 8.8. Added Lantus while patient is on steroid. Continue sliding scale coverage. Will hold increasing Lantus today as will stop steroids  CVA with residual left-sided weakness - Mostly wheelchair-bound. As well as embolus with walker, Continue aspirin.  Elevated troponin - No chest pain symptoms or EKG changes. Suspect demand ischemia.  serial troponins none ACS pattern  (1>>0.9>>0.5). 2-D echo EF 55-60%, with no regional wall motion abnormality, neurologic consult appreciated,cardiac  workup on outpatient basis.  Tobacco use Counseled on cessation.  Chronic back pain - Stable on home dose Dilaudid  Acute urinary retention - Required Foley catheter. DC Foley catheter today      Code Status : Full code  Family Communication  : None at bedside  Disposition Plan  : home possibly in the next 24 hours if symptoms improve.  Barriers For Discharge : Active symptoms  Consults  :   ENT ( Dr Lazarus Salines) consulted on  the phone by ED physician Cardiology Dr. Algie Coffer  Procedures  :  CT neck and chest 2-D echo  DVT Prophylaxis  :  Lovenox -  Lab Results  Component Value Date   PLT 255 07/21/2016    Antibiotics  :    Anti-infectives    Start     Dose/Rate Route Frequency Ordered Stop   07/19/16 1700  azithromycin (ZITHROMAX) 500 mg in dextrose 5 % 250 mL IVPB     500 mg 250 mL/hr over 60 Minutes Intravenous Every 24 hours 07/18/16 1814     07/19/16 1600  cefTRIAXone (ROCEPHIN) 1 g in dextrose 5 % 50 mL IVPB     1 g 100 mL/hr over 30 Minutes Intravenous Every 24 hours 07/18/16 1814     07/18/16 2000  fluconazole (DIFLUCAN) IVPB 100 mg     100 mg 50 mL/hr over 60 Minutes Intravenous Every 24 hours 07/18/16 1813 07/23/16 1959   07/18/16 1500  cefTRIAXone (ROCEPHIN) 1 g in dextrose 5 % 50 mL IVPB     1 g 100 mL/hr over 30 Minutes Intravenous  Once 07/18/16 1449 07/18/16 1654   07/18/16 1500  azithromycin (ZITHROMAX) 500 mg in dextrose 5 % 250 mL IVPB     500 mg 250 mL/hr over 60 Minutes Intravenous  Once 07/18/16 1449 07/18/16 1751        Objective:   Vitals:   07/20/16 0525 07/20/16 1414 07/20/16 2257 07/21/16 0436  BP: (!) 143/72 136/70 137/73 130/90  Pulse: 82 80 64 64  Resp: 18 18 18 18   Temp: 97.9 F (36.6 C) 98.2 F (36.8 C) 98 F (36.7 C) 97.5 F (36.4 C)  TempSrc: Oral Oral Oral Oral  SpO2: 93% 91% 96% 96%  Weight:      Height:        Wt Readings from Last 3 Encounters:  07/18/16 63.6 kg (140 lb 3.4 oz)  07/02/16 61.7 kg (136 lb)  05/07/16 61.5 kg (135 lb 8 oz)     Intake/Output Summary (Last 24 hours) at 07/21/16 1211 Last data filed at 07/21/16 1013  Gross per 24 hour  Intake           1577.5 ml  Output  2825 ml  Net          -1247.5 ml     Physical Exam  Gen: Appears fatigued, not in distress HEENT: no pallor, No neck swelling, no lymphadenopathy, no tenderness, no oral thrush, no posterior pharynx wall erythema  Chest: Diminished bibasilar  breath sounds CVS: N S1&S2, no murmurs, rubs or gallop GI: soft, NT, ND, BS+, Foley placed on admission. Musculoskeletal: warm, no edema CNS: Alert and oriented    Data Review:    CBC  Recent Labs Lab 07/18/16 1319 07/19/16 0652 07/20/16 0543 07/21/16 0621  WBC 27.0* 19.0* 21.5* 10.3  HGB 14.6 12.9 12.8 12.7  HCT 44.6 39.0 37.6 36.3  PLT 230 201 243 255  MCV 91.0 89.9 86.4 85.0  MCH 29.8 29.7 29.4 29.7  MCHC 32.7 33.1 34.0 35.0  RDW 14.8 14.6 14.3 14.5  LYMPHSABS 1.1  --   --   --   MONOABS 1.4*  --   --   --   EOSABS 0.0  --   --   --   BASOSABS 0.0  --   --   --     Chemistries   Recent Labs Lab 07/18/16 1319 07/19/16 0652 07/21/16 0621  NA 133* 133* 137  K 4.0 4.2 3.6  CL 95* 105 106  CO2 26 21* 23  GLUCOSE 236* 244* 105*  BUN 10 8 8   CREATININE 0.71 0.51 0.45  CALCIUM 8.5* 7.3* 7.8*  AST 18  --   --   ALT 8*  --   --   ALKPHOS 113  --   --   BILITOT 1.2  --   --    ------------------------------------------------------------------------------------------------------------------ No results for input(s): CHOL, HDL, LDLCALC, TRIG, CHOLHDL, LDLDIRECT in the last 72 hours.  Lab Results  Component Value Date   HGBA1C 8.8 (H) 12/28/2015   ------------------------------------------------------------------------------------------------------------------ No results for input(s): TSH, T4TOTAL, T3FREE, THYROIDAB in the last 72 hours.  Invalid input(s): FREET3 ------------------------------------------------------------------------------------------------------------------ No results for input(s): VITAMINB12, FOLATE, FERRITIN, TIBC, IRON, RETICCTPCT in the last 72 hours.  Coagulation profile  Recent Labs Lab 07/18/16 1725  INR 1.10    No results for input(s): DDIMER in the last 72 hours.  Cardiac Enzymes  Recent Labs Lab 07/18/16 1811 07/19/16 0009 07/19/16 0652  TROPONINI 0.92* 0.51* 0.37*    ------------------------------------------------------------------------------------------------------------------    Component Value Date/Time   BNP 333.6 (H) 07/18/2016 1327    Inpatient Medications  Scheduled Meds: . aspirin  81 mg Oral Daily  . azithromycin  500 mg Intravenous Q24H  . carbidopa-levodopa  1 tablet Oral TID  . cefTRIAXone (ROCEPHIN)  IV  1 g Intravenous Q24H  . cholecalciferol  2,000 Units Oral Daily  . enoxaparin (LOVENOX) injection  40 mg Subcutaneous Q24H  . fesoterodine  8 mg Oral Daily  . fluconazole (DIFLUCAN) IV  100 mg Intravenous Q24H  . insulin aspart  0-15 Units Subcutaneous TID WC  . insulin aspart  0-5 Units Subcutaneous QHS  . insulin glargine  15 Units Subcutaneous Daily  . LORazepam  1-2 mg Oral TID  . metoprolol tartrate  12.5 mg Oral BID  . sodium chloride flush  3 mL Intravenous Q12H  . [START ON 07/24/2016] Vitamin D (Ergocalciferol)  50,000 Units Oral Q7 days   Continuous Infusions: . sodium chloride 50 mL/hr at 07/20/16 2348   PRN Meds:.albuterol, HYDROmorphone, magic mouthwash w/lidocaine, oxyCODONE, phenol, zolpidem  Micro Results Recent Results (from the past 240 hour(s))  Blood Culture (routine x 2)  Status: None (Preliminary result)   Collection Time: 07/18/16  1:19 PM  Result Value Ref Range Status   Specimen Description BLOOD LW  Final   Special Requests BOTTLES DRAWN AEROBIC AND ANAEROBIC 5CC  Final   Culture   Final    NO GROWTH 2 DAYS Performed at [facility]    Report Status PENDING  Incomplete  Blood Culture (routine x 2)     Status: None (Preliminary result)   Collection Time: 07/18/16  2:19 PM  Result Value Ref Range Status   Specimen Description RIGHT ANTECUBITAL  Final   Special Requests BOTTLES DRAWN AEROBIC ONLY 5CC  Final   Culture   Final    NO GROWTH 2 DAYS Performed at [facility]    Report Status PENDING  Incomplete  Rapid strep screen (not at [facility])     Status: None   Collection  Time: 07/18/16  5:11 PM  Result Value Ref Range Status   Streptococcus, Group A Screen (Direct) NEGATIVE NEGATIVE Final    Comment: (NOTE) A Rapid Antigen test may result negative if the antigen level in the sample is below the detection level of this test. The FDA has not cleared this test as a stand-alone test therefore the rapid antigen negative result has reflexed to a Group A Strep culture.   Culture, group A strep     Status: None   Collection Time: 07/18/16  5:11 PM  Result Value Ref Range Status   Specimen Description THROAT  Final   Special Requests NONE  Final   Culture FEW STREPTOCOCCUS,BETA HEMOLYTIC NOT GROUP A  Final   Report Status 07/20/2016 FINAL  Final  MRSA PCR Screening     Status: None   Collection Time: 07/18/16  6:06 PM  Result Value Ref Range Status   MRSA by PCR NEGATIVE NEGATIVE Final    Comment:        The GeneXpert MRSA Assay (FDA approved for NASAL specimens only), is one component of a comprehensive MRSA colonization surveillance program. It is not intended to diagnose MRSA infection nor to guide or monitor treatment for MRSA infections.   Urine culture     Status: None   Collection Time: 07/19/16  3:16 AM  Result Value Ref Range Status   Specimen Description URINE, RANDOM  Final   Special Requests NONE  Final   Culture   Final    NO GROWTH 1 DAY Performed at [facility]    Report Status 07/20/2016 FINAL  Final    Radiology Reports Dg Chest 2 View  Result Date: 07/17/2016 CLINICAL DATA:  Cough, fever and sore throat. EXAM: CHEST  2 VIEW COMPARISON:  06/24/2014 chest radiograph FINDINGS: The cardiomediastinal silhouette is unremarkable. Subsegmental right lower lung atelectasis noted. There is no evidence of focal airspace disease, pulmonary edema, suspicious pulmonary nodule/mass, pleural effusion, or pneumothorax. No acute bony abnormalities are identified. IMPRESSION: Subsegmental right lower lung atelectasis. No evidence of focal  airspace disease/ pneumonia. Electronically Signed   By: Harmon Pier M.D.   On: 07/17/2016 11:33   Ct Soft Tissue Neck W Contrast  Result Date: 07/18/2016 CLINICAL DATA:  65 year old female with increased shortness of breath cough and oral swelling since yesterday. Diagnosed with URI. Initial encounter. EXAM: CT NECK WITH CONTRAST TECHNIQUE: Multidetector CT imaging of the neck was performed using the standard protocol following the bolus administration of intravenous contrast. CONTRAST:  75mL ISOVUE-300 IOPAMIDOL (ISOVUE-300) INJECTION 61% COMPARISON:  Brain MRI 03/30/2011. FINDINGS: Pharynx and  larynx: Generalized pharyngeal mucosal space thickening involving the oropharynx, hypopharynx, and supraglottic larynx. There is mild mucosal hyper enhancement of the nasopharynx which otherwise appears normal. There is mild thickening of the epiglottis (series 7, image 54). Up to mild associated soft tissue stranding in both parapharyngeal spaces. Trace retropharyngeal effusion. There also appears to be mucosal hyper enhancement of the proximal cervical esophagus (sagittal image 45). The level of the glottis and subglottic trachea appear normal. Salivary glands: Negative sublingual space. Negative submandibular glands and parotid glands. Thyroid: Negative. Lymph nodes: Hyperenhancing bilateral cervical lymph nodes. Bilateral level 2 lymph nodes are mildly enlarged up to 11 mm short axis. Bilateral level 1 B nodes appear heterogeneous and enlarged up to 10 mm short axis. No cystic or necrotic nodes are identified. Tiny retropharyngeal nodes also are hyper enhancing. Vascular: Major vascular structures in the neck and at the skullbase are patent. Carotid atherosclerosis in the neck without stenosis. Limited intracranial: Negative. Visualized orbits: Negative. Mastoids and visualized paranasal sinuses: Visualized paranasal sinuses and mastoids are stable and well pneumatized. Skeleton: Dental periapical lucency at the  posterior right mandible molar. No other acute dental findings. Average for age cervical spine degeneration. No acute osseous abnormality identified. Upper chest: Reported separately today.  Lung apices are clear. Other: None. IMPRESSION: 1. Generalized pharyngitis and laryngitis with pharyngeal mucosal space edema including mild involvement of the epiglottis. Mild parapharyngeal and retropharyngeal inflammation. Mucosal hyper enhancement at the adenoids and also in the cervical esophagus - consider upper esophagitis. 2. Reactive appearing hyper enhancing cervical lymph nodes diffusely, maximal at the level 1 and level 2 stations. 3. No cystic or necrotic nodes.  No neck abscess. 4. Chest CT today reported separately. Electronically Signed   By: Odessa FlemingH  Hall M.D.   On: 07/18/2016 15:23   Ct Chest W Contrast  Result Date: 07/18/2016 CLINICAL DATA:  Upper respiratory infection diagnosed yesterday. Worsening swelling and difficulty eating. Shortness of breath and cough. EXAM: CT CHEST WITH CONTRAST TECHNIQUE: Multidetector CT imaging of the chest was performed during intravenous contrast administration. CONTRAST:  75mL ISOVUE-300 IOPAMIDOL (ISOVUE-300) INJECTION 61% COMPARISON:  Chest radiography 07/17/2016 FINDINGS: Cardiovascular: There is aortic atherosclerosis. No aneurysm or dissection. Heart size is normal. No pericardial fluid. The study was not done as a pulmonary arteriogram study, but no pulmonary emboli are identified. Mediastinum/Nodes: No pathologically enlarged hilar or mediastinal lymph nodes. No esophageal mass. There could be wall thickening of the upper thoracic esophagus as might be seen with esophagitis. Lungs/Pleura: Background pattern of mild emphysema. There is patchy infiltrate in the posterior left lower lobe consistent with mild bronchopneumonia. There is more extensive patchy density in volume loss in the right lower lobe and minimal involvement of the right middle lobe consistent with  bronchopneumonia. No pleural effusion. Upper Abdomen: No acute finding. Musculoskeletal: Bilateral breast implants.  No spinal abnormality. IMPRESSION: Patchy pulmonary density consistent with bronchopneumonia, most pronounced in the right lower lobe but also affecting the left lower lobe and to a minimal extent the right middle lobe. No pleural effusion.  No lymphadenopathy. Question upper thoracic esophageal wall thickening that could go along with esophagitis. This is not definite. Electronically Signed   By: Paulina FusiMark  Shogry M.D.   On: 07/18/2016 15:20   [place] Renal  Result Date: 07/19/2016 CLINICAL DATA:  Acute urinary retention EXAM: RENAL / URINARY TRACT ULTRASOUND COMPLETE COMPARISON:  None. FINDINGS: Right Kidney: Length: 11.2 cm. Echogenicity within normal limits. No mass or hydronephrosis visualized. Left Kidney: Length: 10.7 cm.  1.6  cm cyst is noted in the upper pole Bladder: Decompressed by Foley catheter. Note is made of a tiny amount of free fluid in the right abdomen adjacent to the right kidney. IMPRESSION: Left renal cyst. Minimal trace ascites. Electronically Signed   By: Alcide Clever M.D.   On: 07/19/2016 15:28   Dexascan  Result Date: 07/08/2016 Date of study: 07/02/16 Exam: DUAL X-RAY ABSORPTIOMETRY (DXA) FOR BONE MINERAL DENSITY (BMD) Instrument: [employer] Requesting Provider: PCP Indication: screening for osteoporosis Comparison: none (please note that it is not possible to compare data from different instruments) Clinical data: Pt is a 65 y.o. female without history of fracture. Results:  Lumbar spine (L1-L4) Femoral neck (FN) 33% distal radius T-score - 1.2 RFN: - 2.5 LFN: - 1.5 n/a Change in BMD from previous DXA test (%) Down 5.8% Down 5.5% n/a (*) statistically significant Assessment: Patient has OSTEOPOROSIS according to the [facility] classification for osteoporosis (see below). Fracture risk: high Comments: the technical quality of the study is good Evaluation for secondary  causes should be considered if clinically indicated. Recommend optimizing calcium (1200 mg/day) and vitamin D (800 IU/day). Followup: Repeat BMD is appropriate after 2 years or after 1-2 years if starting treatment. WHO criteria for diagnosis of osteoporosis in postmenopausal women and in men 51 y/o or older: - normal: T-score -1.0 to + 1.0 - osteopenia/low bone density: T-score between -2.5 and -1.0 - osteoporosis: T-score below -2.5 - severe osteoporosis: T-score below -2.5 with history of fragility fracture Note: although not part of the WHO classification, the presence of a fragility fracture, regardless of the T-score, should be considered diagnostic of osteoporosis, provided other causes for the fracture have been excluded. Treatment: The National Osteoporosis Foundation recommends that treatment be considered in postmenopausal women and men age 8 or older with: 1. Hip or vertebral (clinical or morphometric) fracture 2. T-score of - 2.5 or lower at the spine or hip 3. 10-year fracture probability by FRAX of at least 20% for a major osteoporotic fracture and 3% for a hip fracture Roxy Manns MD   Dg Chest Port 1 View  Result Date: 07/19/2016 CLINICAL DATA:  Shortness of breath. EXAM: PORTABLE CHEST 1 VIEW COMPARISON:  CT 07/18/2016. FINDINGS: Persistent mild bibasilar infiltrates, unchanged from prior CT. Heart size stable. Small left pleural effusion cannot be excluded. No pneumothorax. IMPRESSION: Persistent mild bibasilar infiltrates, unchanged from prior CT of 07/18/2016. Electronically Signed   By: Maisie Fus  Register   On: 07/19/2016 06:49     Randol Kern, Elaynah Virginia M.D on 07/21/2016 at 12:11 PM  Between 7am to 7pm - Pager - [phone]  After 7pm go to [email or web address] - password [facility]  [facility] -  Office  [phone]

## 2016-07-22 DIAGNOSIS — A419 Sepsis, unspecified organism: Principal | ICD-10-CM

## 2016-07-22 LAB — GLUCOSE, CAPILLARY: Glucose-Capillary: 101 mg/dL — ABNORMAL HIGH (ref 65–99)

## 2016-07-22 MED ORDER — RISAQUAD PO CAPS: 2.0000 | ORAL_CAPSULE | Freq: Every day | ORAL | 0 refills | Status: DC

## 2016-07-22 MED ORDER — RISAQUAD PO CAPS
2.0000 | ORAL_CAPSULE | Freq: Every day | ORAL | Status: DC
  Administered 2016-07-22: 2 via ORAL
  Filled 2016-07-22: qty 2

## 2016-07-22 MED ORDER — AMOXICILLIN-POT CLAVULANATE 875-125 MG PO TABS
1.0000 | ORAL_TABLET | Freq: Two times a day (BID) | ORAL | Status: DC
  Administered 2016-07-22: 1 via ORAL
  Filled 2016-07-22: qty 1

## 2016-07-22 MED ORDER — METOPROLOL TARTRATE 25 MG PO TABS: 12.5000 mg | ORAL_TABLET | Freq: Two times a day (BID) | ORAL | 0 refills | Status: DC

## 2016-07-22 MED ORDER — AMOXICILLIN-POT CLAVULANATE 875-125 MG PO TABS: 1.0000 | ORAL_TABLET | Freq: Two times a day (BID) | ORAL | 0 refills | Status: AC

## 2016-07-22 NOTE — Discharge Instructions (Signed)
Follow with Primary MD Alex Plotnikov, MD in 7 days  ° °Get CBC, CMP, 2 view Chest X ray checked  by Primary MD next visit.  ° ° °Activity: As tolerated with Full fall precautions use walker/cane & assistance as needed ° ° °Disposition Home  ° ° °Diet: Heart Healthy , carbohydrate  modified , with feeding assistance and aspiration precautions. ° °For Heart failure patients - Check your Weight same time everyday, if you gain over 2 pounds, or you develop in leg swelling, experience more shortness of breath or chest pain, call your Primary MD immediately. Follow Cardiac Low Salt Diet and 1.5 lit/day fluid restriction. ° ° °On your next visit with your primary care physician please Get Medicines reviewed and adjusted. ° ° °Please request your Prim.MD to go over all Hospital Tests and Procedure/Radiological results at the follow up, please get all Hospital records sent to your Prim MD by signing hospital release before you go home. ° ° °If you experience worsening of your admission symptoms, develop shortness of breath, life threatening emergency, suicidal or homicidal thoughts you must seek medical attention immediately by calling 911 or calling your MD immediately  if symptoms less severe. ° °You Must read complete instructions/literature along with all the possible adverse reactions/side effects for all the Medicines you take and that have been prescribed to you. Take any new Medicines after you have completely understood and accpet all the possible adverse reactions/side effects.  ° °Do not drive, operating heavy machinery, perform activities at heights, swimming or participation in water activities or provide baby sitting services if your were admitted for syncope or siezures until you have seen by Primary MD or a Neurologist and advised to do so again. ° °Do not drive when taking Pain medications.  ° ° °Do not take more than prescribed Pain, Sleep and Anxiety Medications ° °Special Instructions: If you have smoked  or chewed Tobacco  in the last 2 yrs please stop smoking, stop any regular Alcohol  and or any Recreational drug use. ° °Wear Seat belts while driving. ° ° °Please note ° °You were cared for by a hospitalist during your hospital stay. If you have any questions about your discharge medications or the care you received while you were in the hospital after you are discharged, you can call the unit and asked to speak with the hospitalist on call if the hospitalist that took care of you is not available. Once you are discharged, your primary care physician will handle any further medical issues. Please note that NO REFILLS for any discharge medications will be authorized once you are discharged, as it is imperative that you return to your primary care physician (or establish a relationship with a primary care physician if you do not have one) for your aftercare needs so that they can reassess your need for medications and monitor your lab values. ° °

## 2016-07-22 NOTE — Care Management Note (Signed)
Case Management Note  Patient Details  Name: Claretta Fraiseanci D Markgraf MRN: [id number] Date of Birth: 12-20-1950  Subjective/Objective:  Sepsis, Pharyngitis/laryngitis with pharyngeal edema, PNA                  Action/Plan: Discharge Planning: AVS reviewed:  NCM spoke to pt and husband, Tammy SoursGreg at bedside. [facility] Pt reports having RW and bedside commode at home.   PCP Tresa GarterPLOTNIKOV, ALEKSEI V   Expected Discharge Date:  07/22/2016              Expected Discharge Plan:  Home w Home Health Services  In-House Referral:  NA  Discharge planning Services  CM Consult  Post Acute Care Choice:  Home Health Choice offered to:  Spouse  DME Arranged:  N/A DME Agency:  NA  HH Arranged:  PT HH Agency:  [facility]  Status of Service:  Completed, signed off  If discussed at Long Length of Stay Meetings, dates discussed:    Additional Comments:  Elliot CousinShavis, [place] Zapien Ellen, RN 07/22/2016, 11:35 AM

## 2016-07-22 NOTE — Discharge Summary (Signed)
[name], is a 65 y.o. female  DOB 06/14/1951  MRN [id number].  Admission date:  07/18/2016  Admitting Physician  Eddie North, MD  Discharge Date:  07/22/2016   Primary MD  [name], MD  Recommendations for primary care physician for things to follow:  - Patient to follow with cardiology as an outpatient - Please check CBC, BMP, 2 view chest x-ray during next visit.   Admission Diagnosis  Hyperglycemia [R73.9] Elevated troponin [R74.8] Acute respiratory failure with hypoxia (HCC) [J96.01] Sepsis, due to unspecified organism (HCC) [A41.9] Fever, unspecified fever cause [R50.9] Pharyngitis, unspecified etiology [J02.9] Community acquired pneumonia, unspecified laterality [J18.9]   Discharge Diagnosis  Hyperglycemia [R73.9] Elevated troponin [R74.8] Acute respiratory failure with hypoxia (HCC) [J96.01] Sepsis, due to unspecified organism (HCC) [A41.9] Fever, unspecified fever cause [R50.9] Pharyngitis, unspecified etiology [J02.9] Community acquired pneumonia, unspecified laterality [J18.9]    Principal Problem:   Sepsis (HCC) Active Problems:   DM2 (diabetes mellitus, type 2) (HCC)   TOBACCO USER   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 Anoxic brain injury (HCC)   . Anxiety   . Chronic neck pain   . Chronic pain in shoulder   . COPD (chronic obstructive pulmonary disease) (HCC)   . Depression   . Fracture of left humerus 06/24/2014  . Gait disorder   . GERD (gastroesophageal reflux disease)   . Hernia of abdominal  cavity   . 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 Hyperlipidemia   . LBP (low back pain)   . Neuropathy (HCC)   . Osteoarthritis   . Seizure disorder (HCC)   . Seizures (HCC)   . Type II or unspecified type diabetes mellitus without mention of complication, not stated as uncontrolled 2010  . Vitamin B 12 deficiency 2011    Past Surgical History:  Procedure Laterality Date  . COLONOSCOPY    . DIAGNOSTIC LAPAROSCOPY     PMH: Exploratory lap  . HERNIA REPAIR     incisional  . ORIF HUMERUS FRACTURE Left 06/24/2014   Procedure: LEFT OPEN REDUCTION INTERNAL FIXATION (ORIF) PROXIMAL HUMERUS FRACTURE;  Surgeon: Eulas Post, MD;  Location: [facility] OR;  Service: Orthopedics;  Laterality: Left;  . PANCREATECTOMY    . TONSILLECTOMY    . TUBAL LIGATION         History of present illness and  Hospital Course:     Kindly see H&P for history of present illness and admission details, please review complete Labs, Consult reports and Test reports for all details in brief  HPI  from the history and physical done on  the day of admission 07/18/2016  [name]  is a 65 y.o. female,With a history of CVA with residual left-sided weakness, mostly wheelchair-bound, hypertension, chronic low back pain on Dilaudid, anxiety, who presented to the ED with 2 day history of shortness of breath with increase swelling in her throat with dysphagia. She has subjective fever. She went to urgent care yesterday where she had a chest x-ray and was told she had upper respiratory infection and was discharged on Levaquin. Her neck swelling started to get worse and was increasingly short of breath. She took a dose of Levaquin yesterday. Denied any sick contact or travel. Denied recent illness, new medications. Patient is able to eat and drink and swallow pills with much difficulty. She denies any nausea, vomiting, chills, body ache, headache, blurred vision, abdominal pain, dysuria or  diarrhea. Has not been able to urinate since this morning.  Course in the ED patient was septic with fever of 102.52F, tachycardic, tachypneic and hypotensive with blood pressure 78/63 mmHg. Blood work showed significant leukocytosis with WBC of 20 7K sodium of 133, chloride 95, normal renal function, glucose of 236 and elevated troponin of 1.02. Lactic acid elevated to 1.93.  Sepsis pathway initiated and patient received 2 L normal saline bolus with improvement in systolic blood pressure to high 90s. CT soft tissue of the neck showed generalized pharyngitis and laryngitis with pharyngeal mucosal space edema with mild involvement of the epiglottis. Also showed mild parapharyngeal and retropharyngeal inflammation. Also commented on possible upper esophagitis. This was discussed with ENT Dr. Lazarus Salines by ED physician who recommended antibiotics, steroid and conservative measures and did not appear to have acute epiglottitis or airway compromise. A CT of the chest done showed multifocal pneumonia and esophagitis.  Blood cultures sent. Patient given IV Rocephin and azithromycin. Hospitalist admission requested to stepdown unit.   Hospital Course   Sepsis [facility]) - Secondary to combination of pharyngitis/laryngitis with pharyngeal edema and lobar pneumonia. - No signs of abscess on admission CT. - Sepsis improving with aggressive IV hydration, empiric antibiotics. Resolved at time of discharge, afebrile over last 72 hours, leukocytosis resolved  - Blood culture so far negative. - Treated empirically on Rocephin and azithromycin , discharged on another 5 days of oral Augmentin .  Pharyngitis/laryngitis with pharyngeal edema - Symptoms of dysphagia/odynophagia with nonproductive cough. - Initially on IV antibiotic and IV Solu-Medrol 40 mg twice a day. ED physician discussed CT findings with ENT Dr Sharen Hones recommended this was unlikely epiglottitis or contributing to respiratory compromise.  Recommended antibiotics, steroids and monitoring, appears to be improving,  IV steroids stopped on 10/20 , advanced to soft diet which she has been tolerating very well.  multilobar pneumonia - Empiric antibiotics as above. Discharged on oral Augmentin  Oral thrush possible esophageal candidiasis/esophagitis - CT shows esophageal thickening with possible esophagitis. Treated with fluconazole, PPI and Magic mouthwash during hospital stay, thrush resolved, no further complaints of dysphagia .   DM2 (diabetes mellitus, type 2) (HCC) - A1c of 8.8. Resume metformin on discharge  CVA with residual left-sided weakness - Mostly wheelchair-bound. As well as embolus with walker, Continue aspirin.  Elevated troponin - No chest pain symptoms or EKG changes. Suspect demand ischemia.  serial troponins none ACS pattern  (1>>0.9>>0.5). 2-D echo EF 55-60%, with no regional wall motion abnormality, etiology consult appreciated,cardiac  workup on outpatient basis.  Tobacco use Counseled on cessation.  Chronic back pain - Stable on home dose Dilaudid  Acute urinary retention - Required Foley  catheter. Discontinued yesterday, with no evidence of recurrence of retention       Discharge Condition:  stable   Follow UP  Follow-up Information    Sonda PrimesAlex Plotnikov, MD. Schedule an appointment as soon as possible for a visit in 1 week(s).   Specialty:  Internal Medicine Contact information: [address] [place] [address] [phone]        [facility] InstituteKADAKIA,AJAY S, MD. Schedule an appointment as soon as possible for a visit in 2 week(s).   Specialty:  Cardiology Contact information: [address] [place] [address] [phone]             Discharge Instructions  and  Discharge Medications     Discharge Instructions    Discharge instructions    Complete by:  As directed    Follow with Primary MD Sonda PrimesAlex Plotnikov, MD in 7 days   Get CBC, CMP, 2 view Chest X ray  checked  by Primary MD next visit.    Activity: As tolerated with Full fall precautions use walker/cane & assistance as needed   Disposition Home    Diet: Heart Healthy , carbohydrate modified , with feeding assistance and aspiration precautions.  For Heart failure patients - Check your Weight same time everyday, if you gain over 2 pounds, or you develop in leg swelling, experience more shortness of breath or chest pain, call your Primary MD immediately. Follow Cardiac Low Salt Diet and 1.5 lit/day fluid restriction.   On your next visit with your primary care physician please Get Medicines reviewed and adjusted.   Please request your Prim.MD to go over all Hospital Tests and Procedure/Radiological results at the follow up, please get all Hospital records sent to your Prim MD by signing hospital release before you go home.   If you experience worsening of your admission symptoms, develop shortness of breath, life threatening emergency, suicidal or homicidal thoughts you must seek medical attention immediately by calling 911 or calling your MD immediately  if symptoms less severe.  You Must read complete instructions/literature along with all the possible adverse reactions/side effects for all the Medicines you take and that have been prescribed to you. Take any new Medicines after you have completely understood and accpet all the possible adverse reactions/side effects.   Do not drive, operating heavy machinery, perform activities at heights, swimming or participation in water activities or provide baby sitting services if your were admitted for syncope or siezures until you have seen by Primary MD or a Neurologist and advised to do so again.  Do not drive when taking Pain medications.    Do not take more than prescribed Pain, Sleep and Anxiety Medications  Special Instructions: If you have smoked or chewed Tobacco  in the last 2 yrs please stop smoking, stop any regular Alcohol  and  or any Recreational drug use.  Wear Seat belts while driving.   Please note  You were cared for by a hospitalist during your hospital stay. If you have any questions about your discharge medications or the care you received while you were in the hospital after you are discharged, you can call the unit and asked to speak with the hospitalist on call if the hospitalist that took care of you is not available. Once you are discharged, your primary care physician will handle any further medical issues. Please note that NO REFILLS for any discharge medications will be authorized once you are discharged, as it is imperative that you return to your  primary care physician (or establish a relationship with a primary care physician if you do not have one) for your aftercare needs so that they can reassess your need for medications and monitor your lab values.   Increase activity slowly    Complete by:  As directed        Medication List    STOP taking these medications   levofloxacin 500 MG tablet Commonly known as:  LEVAQUIN     TAKE these medications   acidophilus Caps capsule Take 2 capsules by mouth daily. Start taking on:  07/23/2016   amoxicillin-clavulanate 875-125 MG tablet Commonly known as:  AUGMENTIN Take 1 tablet by mouth every 12 (twelve) hours.   aspirin 81 MG EC tablet Take 81 mg by mouth daily.   carbidopa-levodopa 25-250 MG tablet Commonly known as:  SINEMET IR Take 1 tablet by mouth 3 (three) times daily.   cholecalciferol 1000 units tablet Commonly known as:  VITAMIN D Take 2 tablets (2,000 Units total) by mouth daily.   HYDROmorphone 2 MG tablet Commonly known as:  DILAUDID Take 2-3 tablets (4-6 mg total) by mouth every 6 (six) hours as needed for moderate pain or severe pain. What changed:  how much to take  when to take this   LORazepam 2 MG tablet Commonly known as:  ATIVAN Take 0.5-1 tablets (1-2 mg total) by mouth 3 (three) times daily. What changed:   how much to take   metFORMIN 500 MG 24 hr tablet Commonly known as:  GLUCOPHAGE-XR TAKE 1 TABLET THREE TIMES A DAY   metoprolol tartrate 25 MG tablet Commonly known as:  LOPRESSOR Take 0.5 tablets (12.5 mg total) by mouth 2 (two) times daily.   mirtazapine 15 MG tablet Commonly known as:  REMERON TAKE 1 TABLET BY MOUTH AT BEDTIME AT 6-8 PM   promethazine 12.5 MG tablet Commonly known as:  PHENERGAN Take 1 tablet (12.5 mg total) by mouth every 6 (six) hours as needed. for nausea   tolterodine 4 MG 24 hr capsule Commonly known as:  DETROL LA TAKE ONE CAPSULE AT BEDTIME   Vitamin D (Ergocalciferol) 50000 units Caps capsule Commonly known as:  DRISDOL TAKE 1 CAPSULE (50,000 UNITS TOTAL) BY MOUTH ONCE A WEEK.   zolpidem 10 MG tablet Commonly known as:  AMBIEN Take 1 tablet (10 mg total) by mouth at bedtime as needed. for sleep What changed:  when to take this  additional instructions   zoster vaccine live (PF) 19400 UNT/0.65ML injection Commonly known as:  ZOSTAVAX Inject 19,400 Units into the skin once.         Diet and Activity recommendation: See Discharge Instructions above   Consults obtained -   ENT ( Dr Lazarus Salines) consulted on the phone by ED physician Cardiology Dr. Algie Coffer   Major procedures and Radiology Reports - PLEASE review detailed and final reports for all details, in brief -  CT neck and chest 2-D echo  Dg Chest 2 View  Result Date: 07/17/2016 CLINICAL DATA:  Cough, fever and sore throat. EXAM: CHEST  2 VIEW COMPARISON:  06/24/2014 chest radiograph FINDINGS: The cardiomediastinal silhouette is unremarkable. Subsegmental right lower lung atelectasis noted. There is no evidence of focal airspace disease, pulmonary edema, suspicious pulmonary nodule/mass, pleural effusion, or pneumothorax. No acute bony abnormalities are identified. IMPRESSION: Subsegmental right lower lung atelectasis. No evidence of focal airspace disease/ pneumonia.  Electronically Signed   By: Harmon Pier M.D.   On: 07/17/2016 11:33   Ct Soft Tissue Neck  W Contrast  Result Date: 07/18/2016 CLINICAL DATA:  65 year old female with increased shortness of breath cough and oral swelling since yesterday. Diagnosed with URI. Initial encounter. EXAM: CT NECK WITH CONTRAST TECHNIQUE: Multidetector CT imaging of the neck was performed using the standard protocol following the bolus administration of intravenous contrast. CONTRAST:  75mL ISOVUE-300 IOPAMIDOL (ISOVUE-300) INJECTION 61% COMPARISON:  Brain MRI 03/30/2011. FINDINGS: Pharynx and larynx: Generalized pharyngeal mucosal space thickening involving the oropharynx, hypopharynx, and supraglottic larynx. There is mild mucosal hyper enhancement of the nasopharynx which otherwise appears normal. There is mild thickening of the epiglottis (series 7, image 54). Up to mild associated soft tissue stranding in both parapharyngeal spaces. Trace retropharyngeal effusion. There also appears to be mucosal hyper enhancement of the proximal cervical esophagus (sagittal image 45). The level of the glottis and subglottic trachea appear normal. Salivary glands: Negative sublingual space. Negative submandibular glands and parotid glands. Thyroid: Negative. Lymph nodes: Hyperenhancing bilateral cervical lymph nodes. Bilateral level 2 lymph nodes are mildly enlarged up to 11 mm short axis. Bilateral level 1 B nodes appear heterogeneous and enlarged up to 10 mm short axis. No cystic or necrotic nodes are identified. Tiny retropharyngeal nodes also are hyper enhancing. Vascular: Major vascular structures in the neck and at the skullbase are patent. Carotid atherosclerosis in the neck without stenosis. Limited intracranial: Negative. Visualized orbits: Negative. Mastoids and visualized paranasal sinuses: Visualized paranasal sinuses and mastoids are stable and well pneumatized. Skeleton: Dental periapical lucency at the posterior right mandible molar.  No other acute dental findings. Average for age cervical spine degeneration. No acute osseous abnormality identified. Upper chest: Reported separately today.  Lung apices are clear. Other: None. IMPRESSION: 1. Generalized pharyngitis and laryngitis with pharyngeal mucosal space edema including mild involvement of the epiglottis. Mild parapharyngeal and retropharyngeal inflammation. Mucosal hyper enhancement at the adenoids and also in the cervical esophagus - consider upper esophagitis. 2. Reactive appearing hyper enhancing cervical lymph nodes diffusely, maximal at the level 1 and level 2 stations. 3. No cystic or necrotic nodes.  No neck abscess. 4. Chest CT today reported separately. Electronically Signed   By: Odessa Fleming M.D.   On: 07/18/2016 15:23   Ct Chest W Contrast  Result Date: 07/18/2016 CLINICAL DATA:  Upper respiratory infection diagnosed yesterday. Worsening swelling and difficulty eating. Shortness of breath and cough. EXAM: CT CHEST WITH CONTRAST TECHNIQUE: Multidetector CT imaging of the chest was performed during intravenous contrast administration. CONTRAST:  75mL ISOVUE-300 IOPAMIDOL (ISOVUE-300) INJECTION 61% COMPARISON:  Chest radiography 07/17/2016 FINDINGS: Cardiovascular: There is aortic atherosclerosis. No aneurysm or dissection. Heart size is normal. No pericardial fluid. The study was not done as a pulmonary arteriogram study, but no pulmonary emboli are identified. Mediastinum/Nodes: No pathologically enlarged hilar or mediastinal lymph nodes. No esophageal mass. There could be wall thickening of the upper thoracic esophagus as might be seen with esophagitis. Lungs/Pleura: Background pattern of mild emphysema. There is patchy infiltrate in the posterior left lower lobe consistent with mild bronchopneumonia. There is more extensive patchy density in volume loss in the right lower lobe and minimal involvement of the right middle lobe consistent with bronchopneumonia. No pleural effusion.  Upper Abdomen: No acute finding. Musculoskeletal: Bilateral breast implants.  No spinal abnormality. IMPRESSION: Patchy pulmonary density consistent with bronchopneumonia, most pronounced in the right lower lobe but also affecting the left lower lobe and to a minimal extent the right middle lobe. No pleural effusion.  No lymphadenopathy. Question upper thoracic esophageal wall thickening that could  go along with esophagitis. This is not definite. Electronically Signed   By: Paulina Fusi M.D.   On: 07/18/2016 15:20   US Renal  Result Date: 07/19/2016 CLINICAL DATA:  Acute urinary retention EXAM: RENAL / URINARY TRACT ULTRASOUND COMPLETE COMPARISON:  None. FINDINGS: Right Kidney: Length: 11.2 cm. Echogenicity within normal limits. No mass or hydronephrosis visualized. Left Kidney: Length: 10.7 cm.  1.6 cm cyst is noted in the upper pole Bladder: Decompressed by Foley catheter. Note is made of a tiny amount of free fluid in the right abdomen adjacent to the right kidney. IMPRESSION: Left renal cyst. Minimal trace ascites. Electronically Signed   By: Alcide Clever M.D.   On: 07/19/2016 15:28   Dexascan  Result Date: 07/08/2016 Date of study: 07/02/16 Exam: DUAL X-RAY ABSORPTIOMETRY (DXA) FOR BONE MINERAL DENSITY (BMD) Instrument: [employer] Requesting Provider: PCP Indication: screening for osteoporosis Comparison: none (please note that it is not possible to compare data from different instruments) Clinical data: Pt is a 65 y.o. female without history of fracture. Results:  Lumbar spine (L1-L4) Femoral neck (FN) 33% distal radius T-score - 1.2 RFN: - 2.5 LFN: - 1.5 n/a Change in BMD from previous DXA test (%) Down 5.8% Down 5.5% n/a (*) statistically significant Assessment: Patient has OSTEOPOROSIS according to the [facility] classification for osteoporosis (see below). Fracture risk: high Comments: the technical quality of the study is good Evaluation for secondary causes should be considered if clinically  indicated. Recommend optimizing calcium (1200 mg/day) and vitamin D (800 IU/day). Followup: Repeat BMD is appropriate after 2 years or after 1-2 years if starting treatment. WHO criteria for diagnosis of osteoporosis in postmenopausal women and in men 55 y/o or older: - normal: T-score -1.0 to + 1.0 - osteopenia/low bone density: T-score between -2.5 and -1.0 - osteoporosis: T-score below -2.5 - severe osteoporosis: T-score below -2.5 with history of fragility fracture Note: although not part of the WHO classification, the presence of a fragility fracture, regardless of the T-score, should be considered diagnostic of osteoporosis, provided other causes for the fracture have been excluded. Treatment: The National Osteoporosis Foundation recommends that treatment be considered in postmenopausal women and men age 53 or older with: 1. Hip or vertebral (clinical or morphometric) fracture 2. T-score of - 2.5 or lower at the spine or hip 3. 10-year fracture probability by FRAX of at least 20% for a major osteoporotic fracture and 3% for a hip fracture Roxy Manns MD   Dg Chest Port 1 View  Result Date: 07/19/2016 CLINICAL DATA:  Shortness of breath. EXAM: PORTABLE CHEST 1 VIEW COMPARISON:  CT 07/18/2016. FINDINGS: Persistent mild bibasilar infiltrates, unchanged from prior CT. Heart size stable. Small left pleural effusion cannot be excluded. No pneumothorax. IMPRESSION: Persistent mild bibasilar infiltrates, unchanged from prior CT of 07/18/2016. Electronically Signed   By: Maisie Fus  Register   On: 07/19/2016 06:49    Micro Results     Recent Results (from the past 240 hour(s))  Blood Culture (routine x 2)     Status: None (Preliminary result)   Collection Time: 07/18/16  1:19 PM  Result Value Ref Range Status   Specimen Description BLOOD LW  Final   Special Requests BOTTLES DRAWN AEROBIC AND ANAEROBIC 5CC  Final   Culture   Final    NO GROWTH 3 DAYS Performed at [facility]    Report Status  PENDING  Incomplete  Blood Culture (routine x 2)     Status: None (Preliminary result)  Collection Time: 07/18/16  2:19 PM  Result Value Ref Range Status   Specimen Description RIGHT ANTECUBITAL  Final   Special Requests BOTTLES DRAWN AEROBIC ONLY 5CC  Final   Culture   Final    NO GROWTH 3 DAYS Performed at [facility]    Report Status PENDING  Incomplete  Rapid strep screen (not at [facility])     Status: None   Collection Time: 07/18/16  5:11 PM  Result Value Ref Range Status   Streptococcus, Group A Screen (Direct) NEGATIVE NEGATIVE Final    Comment: (NOTE) A Rapid Antigen test may result negative if the antigen level in the sample is below the detection level of this test. The FDA has not cleared this test as a stand-alone test therefore the rapid antigen negative result has reflexed to a Group A Strep culture.   Culture, group A strep     Status: None   Collection Time: 07/18/16  5:11 PM  Result Value Ref Range Status   Specimen Description THROAT  Final   Special Requests NONE  Final   Culture FEW STREPTOCOCCUS,BETA HEMOLYTIC NOT GROUP A  Final   Report Status 07/20/2016 FINAL  Final  MRSA PCR Screening     Status: None   Collection Time: 07/18/16  6:06 PM  Result Value Ref Range Status   MRSA by PCR NEGATIVE NEGATIVE Final    Comment:        The GeneXpert MRSA Assay (FDA approved for NASAL specimens only), is one component of a comprehensive MRSA colonization surveillance program. It is not intended to diagnose MRSA infection nor to guide or monitor treatment for MRSA infections.   Urine culture     Status: None   Collection Time: 07/19/16  3:16 AM  Result Value Ref Range Status   Specimen Description URINE, RANDOM  Final   Special Requests NONE  Final   Culture   Final    NO GROWTH 1 DAY Performed at [facility]    Report Status 07/20/2016 FINAL  Final       Today   Subjective:   [name] today has no headache,no chest or abdominal  pain, feels much better wants to go home today.   Objective:   Blood pressure (!) 151/71, pulse (!) 53, temperature 98.3 F (36.8 C), temperature source Oral, resp. rate 20, height 5\' 5"  (1.651 m), weight 63.6 kg (140 lb 3.4 oz), SpO2 95 %.   Intake/Output Summary (Last 24 hours) at 07/22/16 1131 Last data filed at 07/22/16 0849  Gross per 24 hour  Intake              240 ml  Output             1150 ml  Net             -910 ml    Exam Gen: Appears fatigued, not in distress HEENT: no pallor, No neck swelling, no lymphadenopathy, no tenderness, no oral thrush, no posterior pharynx wall erythema  Chest: Diminished bibasilar breath sounds CVS: N S1&S2, no murmurs, rubs or gallop GI: soft, NT, ND, BS+ Musculoskeletal: warm, no edema CNS: Alert and oriented  Data Review   CBC w Diff: Lab Results  Component Value Date   WBC 10.3 07/21/2016   HGB 12.7 07/21/2016   HCT 36.3 07/21/2016   PLT 255 07/21/2016   LYMPHOPCT 4 07/18/2016   MONOPCT 5 07/18/2016   EOSPCT 0 07/18/2016   BASOPCT 0 07/18/2016  CMP: Lab Results  Component Value Date   NA 137 07/21/2016   K 3.6 07/21/2016   CL 106 07/21/2016   CO2 23 07/21/2016   BUN 8 07/21/2016   CREATININE 0.45 07/21/2016   PROT 7.2 07/18/2016   ALBUMIN 3.9 07/18/2016   BILITOT 1.2 07/18/2016   ALKPHOS 113 07/18/2016   AST 18 07/18/2016   ALT 8 (L) 07/18/2016  .   Total Time in preparing paper work, data evaluation and todays exam - 35 minutes  Audrea Bolte M.D on 07/22/2016 at 11:31 AM  [facility]   Office  [phone]

## 2016-07-22 NOTE — Care Management Important Message (Signed)
Important Message  Patient Details  Name: [name] MRN: [id number] Date of Birth: 05/08/1951   Medicare Important Message Given:  Yes    Elliot CousinShavis, Hazely Sealey Ellen, RN 07/22/2016, 11:37 AM

## 2016-07-23 ENCOUNTER — Telehealth: Payer: Self-pay | Admitting: *Deleted

## 2016-07-23 LAB — CULTURE, BLOOD (ROUTINE X 2)
Culture: NO GROWTH
Culture: NO GROWTH

## 2016-07-23 NOTE — Telephone Encounter (Signed)
Transition Care Management Follow-up Telephone Call   Date discharged? 07/22/16   How have you been since you were released from the hospital? Pt states she is doing ok   Do you understand why you were in the hospital? YES   Do you understand the discharge instructions? YES   Where were you discharged to? Home   Items Reviewed:  Medications reviewed: YES  Allergies reviewed: YES  Dietary changes reviewed: NO  Referrals reviewed: YES, will need to get referral to see cardiologist   Functional Questionnaire:   Activities of Daily Living (ADLs):   She states she are independent in the following: bathing and hygiene, feeding, continence, grooming, toileting and dressing States she require assistance with the following: ambulation   Any transportation issues/concerns?: NO   Any patient concerns? NO   Confirmed importance and date/time of follow-up visits scheduled YES, appt 07/31/16  Provider Appointment booked with Alysia Pennacharlotte Nche, NP due to PCP not available  Confirmed with patient if condition begins to worsen call PCP or go to the ER.  Patient was given the office number and encouraged to call back with question or concerns.  : YES

## 2016-07-24 DIAGNOSIS — J189 Pneumonia, unspecified organism: Secondary | ICD-10-CM | POA: Diagnosis not present

## 2016-07-24 DIAGNOSIS — F419 Anxiety disorder, unspecified: Secondary | ICD-10-CM | POA: Diagnosis not present

## 2016-07-24 DIAGNOSIS — E1165 Type 2 diabetes mellitus with hyperglycemia: Secondary | ICD-10-CM | POA: Diagnosis not present

## 2016-07-24 DIAGNOSIS — M150 Primary generalized (osteo)arthritis: Secondary | ICD-10-CM | POA: Diagnosis not present

## 2016-07-24 DIAGNOSIS — G40909 Epilepsy, unspecified, not intractable, without status epilepticus: Secondary | ICD-10-CM | POA: Diagnosis not present

## 2016-07-24 DIAGNOSIS — J440 Chronic obstructive pulmonary disease with acute lower respiratory infection: Secondary | ICD-10-CM | POA: Diagnosis not present

## 2016-07-24 DIAGNOSIS — Z87891 Personal history of nicotine dependence: Secondary | ICD-10-CM | POA: Diagnosis not present

## 2016-07-24 DIAGNOSIS — J029 Acute pharyngitis, unspecified: Secondary | ICD-10-CM | POA: Diagnosis not present

## 2016-07-24 DIAGNOSIS — I69354 Hemiplegia and hemiparesis following cerebral infarction affecting left non-dominant side: Secondary | ICD-10-CM | POA: Diagnosis not present

## 2016-07-24 DIAGNOSIS — F329 Major depressive disorder, single episode, unspecified: Secondary | ICD-10-CM | POA: Diagnosis not present

## 2016-07-24 DIAGNOSIS — K219 Gastro-esophageal reflux disease without esophagitis: Secondary | ICD-10-CM | POA: Diagnosis not present

## 2016-07-24 DIAGNOSIS — I10 Essential (primary) hypertension: Secondary | ICD-10-CM | POA: Diagnosis not present

## 2016-07-24 DIAGNOSIS — Z7984 Long term (current) use of oral hypoglycemic drugs: Secondary | ICD-10-CM | POA: Diagnosis not present

## 2016-07-26 ENCOUNTER — Telehealth: Payer: Self-pay | Admitting: Internal Medicine

## 2016-07-26 DIAGNOSIS — I69354 Hemiplegia and hemiparesis following cerebral infarction affecting left non-dominant side: Secondary | ICD-10-CM | POA: Diagnosis not present

## 2016-07-26 DIAGNOSIS — G40909 Epilepsy, unspecified, not intractable, without status epilepticus: Secondary | ICD-10-CM | POA: Diagnosis not present

## 2016-07-26 DIAGNOSIS — E1165 Type 2 diabetes mellitus with hyperglycemia: Secondary | ICD-10-CM | POA: Diagnosis not present

## 2016-07-26 DIAGNOSIS — J189 Pneumonia, unspecified organism: Secondary | ICD-10-CM | POA: Diagnosis not present

## 2016-07-26 DIAGNOSIS — J440 Chronic obstructive pulmonary disease with acute lower respiratory infection: Secondary | ICD-10-CM | POA: Diagnosis not present

## 2016-07-26 DIAGNOSIS — J029 Acute pharyngitis, unspecified: Secondary | ICD-10-CM | POA: Diagnosis not present

## 2016-07-26 NOTE — Telephone Encounter (Signed)
[name] called from [facility] request order for PT because pt was in the hospital for pneumonia and sepsis. Please call her back, she left vm 3 days ago but never heard anything from our office.

## 2016-07-26 NOTE — Telephone Encounter (Signed)
Ok PT Thx 

## 2016-07-27 NOTE — Telephone Encounter (Signed)
Left detailed mess informing [name] of below.

## 2016-07-31 ENCOUNTER — Telehealth: Payer: Self-pay | Admitting: Emergency Medicine

## 2016-07-31 ENCOUNTER — Other Ambulatory Visit (INDEPENDENT_AMBULATORY_CARE_PROVIDER_SITE_OTHER): Payer: Medicare Other

## 2016-07-31 ENCOUNTER — Ambulatory Visit (INDEPENDENT_AMBULATORY_CARE_PROVIDER_SITE_OTHER): Payer: Medicare Other | Admitting: Nurse Practitioner

## 2016-07-31 ENCOUNTER — Encounter: Payer: Self-pay | Admitting: Nurse Practitioner

## 2016-07-31 VITALS — BP 118/74 | Temp 99.0°F | Ht 65.0 in | Wt 134.0 lb

## 2016-07-31 DIAGNOSIS — J189 Pneumonia, unspecified organism: Secondary | ICD-10-CM | POA: Diagnosis not present

## 2016-07-31 DIAGNOSIS — M810 Age-related osteoporosis without current pathological fracture: Secondary | ICD-10-CM

## 2016-07-31 DIAGNOSIS — E559 Vitamin D deficiency, unspecified: Secondary | ICD-10-CM

## 2016-07-31 DIAGNOSIS — D72829 Elevated white blood cell count, unspecified: Secondary | ICD-10-CM | POA: Insufficient documentation

## 2016-07-31 DIAGNOSIS — R748 Abnormal levels of other serum enzymes: Secondary | ICD-10-CM

## 2016-07-31 DIAGNOSIS — R7989 Other specified abnormal findings of blood chemistry: Secondary | ICD-10-CM

## 2016-07-31 DIAGNOSIS — E1100 Type 2 diabetes mellitus with hyperosmolarity without nonketotic hyperglycemic-hyperosmolar coma (NKHHC): Secondary | ICD-10-CM | POA: Diagnosis not present

## 2016-07-31 DIAGNOSIS — R778 Other specified abnormalities of plasma proteins: Secondary | ICD-10-CM

## 2016-07-31 LAB — CBC WITH DIFFERENTIAL/PLATELET
Basophils Absolute: 0.1 10*3/uL (ref 0.0–0.1)
Basophils Relative: 0.3 % (ref 0.0–3.0)
Eosinophils Absolute: 0.1 10*3/uL (ref 0.0–0.7)
Eosinophils Relative: 0.4 % (ref 0.0–5.0)
HCT: 40.3 % (ref 36.0–46.0)
Hemoglobin: 13.6 g/dL (ref 12.0–15.0)
Lymphocytes Relative: 10.6 % — ABNORMAL LOW (ref 12.0–46.0)
Lymphs Abs: 2.2 10*3/uL (ref 0.7–4.0)
MCHC: 33.6 g/dL (ref 30.0–36.0)
MCV: 87.2 fl (ref 78.0–100.0)
Monocytes Absolute: 0.8 10*3/uL (ref 0.1–1.0)
Monocytes Relative: 3.9 % (ref 3.0–12.0)
Neutro Abs: 17.6 10*3/uL — ABNORMAL HIGH (ref 1.4–7.7)
Neutrophils Relative %: 84.8 % — ABNORMAL HIGH (ref 43.0–77.0)
Platelets: 414 10*3/uL — ABNORMAL HIGH (ref 150.0–400.0)
RBC: 4.63 Mil/uL (ref 3.87–5.11)
RDW: 15.9 % — ABNORMAL HIGH (ref 11.5–15.5)
WBC: 20.8 10*3/uL (ref 4.0–10.5)

## 2016-07-31 LAB — BASIC METABOLIC PANEL
BUN: 9 mg/dL (ref 6–23)
CO2: 29 mEq/L (ref 19–32)
Calcium: 9.3 mg/dL (ref 8.4–10.5)
Chloride: 93 mEq/L — ABNORMAL LOW (ref 96–112)
Creatinine, Ser: 0.56 mg/dL (ref 0.40–1.20)
GFR: 115.15 mL/min (ref >60.00)
Glucose, Bld: 379 mg/dL — ABNORMAL HIGH (ref 70–99)
Potassium: 4.5 mEq/L (ref 3.5–5.1)
Sodium: 132 mEq/L — ABNORMAL LOW (ref 135–145)

## 2016-07-31 LAB — HEMOGLOBIN A1C: Hgb A1c MFr Bld: 9.8 % — ABNORMAL HIGH (ref 4.6–6.5)

## 2016-07-31 LAB — VITAMIN D 25 HYDROXY (VIT D DEFICIENCY, FRACTURES): VITD: 28.51 ng/mL — ABNORMAL LOW (ref 30.00–100.00)

## 2016-07-31 MED ORDER — REPAGLINIDE 1 MG PO TABS: 1.0000 mg | ORAL_TABLET | Freq: Three times a day (TID) | ORAL | 3 refills | Status: DC

## 2016-07-31 MED ORDER — CALCIUM CARBONATE 1500 (600 CA) MG PO TABS: 1500.0000 mg | ORAL_TABLET | Freq: Two times a day (BID) | ORAL | 4 refills | Status: DC

## 2016-07-31 MED ORDER — LEVOFLOXACIN 500 MG PO TABS: 500.0000 mg | ORAL_TABLET | Freq: Every day | ORAL | 0 refills | Status: AC

## 2016-07-31 NOTE — Telephone Encounter (Signed)
Okay, thank you for letting me know. I will wait to review CXR and other lab results before contacting patient.

## 2016-07-31 NOTE — Assessment & Plan Note (Signed)
Repeat CBC indicates elevated WBC at 20 with bands. Suspect possible aspiration pneumonia, resume Levaquin therapy. Repeat chest x-ray tomorrow and CBC in 1 week.

## 2016-07-31 NOTE — Progress Notes (Signed)
Subjective:  Patient ID: [name], female    DOB: Oct 22, 1950  Age: 65 y.o. MRN: [id number]  CC: Pneumonia (follow up for pneumonia)  Hospital follow-up: Admitted 07/18/2016  Discharged 07/22/2016. Ms. Adline PotterGunn [facility] with acute respiratory failure and sepsis secondary to multifocal pneumonia. She was also treated for acute pharyngitis and hypoglycemia. CT chest with contrast indicates patchy pulmonary density consistent with bronchopneumonia, more prominent in the right lower lobe but also affecting left lower lobe. CT soft tissue neck with contrast indicated generalized pharyngitis and laryngitis with mucosal edema including mild epiglotitis. She was treated with IV Rocephin and Azithromycin. Discharged with Augmentin. During hospitalization cardiac enzymes indicated elevated troponins without chest pain or EKG changes. Echocardiogram was done; EF of 55-60% with no regional wall motion abnormality. Patient denies tobacco use since discharge from hospital. Home physical therapy was initiated yesterday.  She denies any acute complaints today (no chest pain or shortness of breath or cough or abdominal pain or constipation or diarrhea or nausea or vomiting or urinary symptoms or fever). Reports that she has completed Augmentin as prescribed.  Pneumonia  There is no chest tightness, cough, difficulty breathing, frequent throat clearing, hemoptysis, hoarse voice, shortness of breath, sputum production or wheezing. This is a new problem. The current episode started 1 to 4 weeks ago. The problem has been gradually improving. Pertinent negatives include no appetite change, chest pain, dyspnea on exertion, fever, headaches, malaise/fatigue, myalgias, orthopnea, PND, rhinorrhea, sore throat, trouble swallowing or weight loss. Relieved by: completed oral abx course. Risk factors for lung disease include smoking/tobacco exposure. Her past medical history is significant for  bronchitis and COPD.   Dm: Does not check blood sugar at home. She is currently taking metformin as prescribed. Serum glucose remained elevated during office hospitalization. Ranging from 130s to 300s. She does not check blood sugar at home at this time.  Dexa Scan results: Informed patient about dexa scan results. I verbalizes interest in taking prolia for osteoporosis.current use of vit D supplement but no calcium.  Outpatient Medications Prior to Visit  Medication Sig Dispense Refill  . acidophilus (RISAQUAD) CAPS capsule Take 2 capsules by mouth daily. 20 capsule 0  . aspirin 81 MG EC tablet Take 81 mg by mouth daily.      . carbidopa-levodopa (SINEMET IR) 25-250 MG tablet Take 1 tablet by mouth 3 (three) times daily. 270 tablet 3  . cholecalciferol (VITAMIN D) 1000 UNITS tablet Take 2 tablets (2,000 Units total) by mouth daily. 100 tablet 3  . HYDROmorphone (DILAUDID) 2 MG tablet Take 2-3 tablets (4-6 mg total) by mouth every 6 (six) hours as needed for moderate pain or severe pain. (Patient taking differently: Take 2 mg by mouth every 8 (eight) hours as needed for moderate pain or severe pain. ) 75 tablet 0  . LORazepam (ATIVAN) 2 MG tablet Take 0.5-1 tablets (1-2 mg total) by mouth 3 (three) times daily. (Patient taking differently: Take 2 mg by mouth 3 (three) times daily. ) 90 tablet 3  . metFORMIN (GLUCOPHAGE-XR) 500 MG 24 hr tablet TAKE 1 TABLET THREE TIMES A DAY 270 tablet 1  . metoprolol tartrate (LOPRESSOR) 25 MG tablet Take 0.5 tablets (12.5 mg total) by mouth 2 (two) times daily. 30 tablet 0  . mirtazapine (REMERON) 15 MG tablet TAKE 1 TABLET BY MOUTH AT BEDTIME AT 6-8 PM 90 tablet 3  . promethazine (PHENERGAN) 12.5 MG tablet Take 1 tablet (12.5 mg total) by mouth every  6 (six) hours as needed. for nausea 60 tablet 1  . tolterodine (DETROL LA) 4 MG 24 hr capsule TAKE ONE CAPSULE AT BEDTIME 90 capsule 3  . Vitamin D, Ergocalciferol, (DRISDOL) 50000 units CAPS capsule TAKE 1  CAPSULE (50,000 UNITS TOTAL) BY MOUTH ONCE A WEEK. 6 capsule 0  . zolpidem (AMBIEN) 10 MG tablet Take 1 tablet (10 mg total) by mouth at bedtime as needed. for sleep (Patient taking differently: Take 10 mg by mouth at bedtime. for sleep) 30 tablet 2  . zoster vaccine live, PF, (ZOSTAVAX) 16109 UNT/0.65ML injection Inject 19,400 Units into the skin once. 1 each 0   No facility-administered medications prior to visit.     ROS See HPI  Objective:  BP 118/74 (BP Location: Left Arm, Patient Position: Sitting, Cuff Size: Normal)   Temp 99 F (37.2 C)   Ht 5\' 5"  (1.651 m)   Wt 134 lb (60.8 kg)   SpO2 97%   BMI 22.30 kg/m   BP Readings from Last 3 Encounters:  07/31/16 118/74  07/22/16 (!) 151/71  07/17/16 130/74    Wt Readings from Last 3 Encounters:  07/31/16 134 lb (60.8 kg)  07/18/16 140 lb 3.4 oz (63.6 kg)  07/02/16 136 lb (61.7 kg)    Physical Exam  Constitutional: She is oriented to person, place, and time.  Eyes: No scleral icterus.  Neck: Normal range of motion. Neck supple.  Cardiovascular: Normal rate and normal heart sounds.   Pulmonary/Chest: Effort normal. No respiratory distress. She has no wheezes. She has rales. She exhibits no tenderness.  diminished lung sounds with rales in lower lobes. Normal and clear lung sounds in upper lobes.  Abdominal: Soft. Bowel sounds are normal.  Musculoskeletal: She exhibits edema.  Lymphadenopathy:    She has cervical adenopathy.  Neurological: She is alert and oriented to person, place, and time.  Skin: Skin is warm and dry.  Vitals reviewed.   Lab Results  Component Value Date   WBC 20.8 Repeated and verified X2. (HH) 07/31/2016   HGB 13.6 07/31/2016   HCT 40.3 07/31/2016   PLT 414.0 (H) 07/31/2016   GLUCOSE 379 (H) 07/31/2016   CHOL 203 (H) 03/11/2015   TRIG 88.0 03/11/2015   HDL 72.20 03/11/2015   LDLDIRECT 100.8 10/26/2013   LDLCALC 113 (H) 03/11/2015   ALT 8 (L) 07/18/2016   AST 18 07/18/2016   NA 132 (L)  07/31/2016   K 4.5 07/31/2016   CL 93 (L) 07/31/2016   CREATININE 0.56 07/31/2016   BUN 9 07/31/2016   CO2 29 07/31/2016   TSH 1.43 12/28/2015   INR 1.10 07/18/2016   HGBA1C 9.8 (H) 07/31/2016    Ct Soft Tissue Neck W Contrast  Result Date: 07/18/2016 CLINICAL DATA:  65 year old female with increased shortness of breath cough and oral swelling since yesterday. Diagnosed with URI. Initial encounter. EXAM: CT NECK WITH CONTRAST TECHNIQUE: Multidetector CT imaging of the neck was performed using the standard protocol following the bolus administration of intravenous contrast. CONTRAST:  75mL ISOVUE-300 IOPAMIDOL (ISOVUE-300) INJECTION 61% COMPARISON:  Brain MRI 03/30/2011. FINDINGS: Pharynx and larynx: Generalized pharyngeal mucosal space thickening involving the oropharynx, hypopharynx, and supraglottic larynx. There is mild mucosal hyper enhancement of the nasopharynx which otherwise appears normal. There is mild thickening of the epiglottis (series 7, image 54). Up to mild associated soft tissue stranding in both parapharyngeal spaces. Trace retropharyngeal effusion. There also appears to be mucosal hyper enhancement of the proximal cervical esophagus (sagittal image 45).  The level of the glottis and subglottic trachea appear normal. Salivary glands: Negative sublingual space. Negative submandibular glands and parotid glands. Thyroid: Negative. Lymph nodes: Hyperenhancing bilateral cervical lymph nodes. Bilateral level 2 lymph nodes are mildly enlarged up to 11 mm short axis. Bilateral level 1 B nodes appear heterogeneous and enlarged up to 10 mm short axis. No cystic or necrotic nodes are identified. Tiny retropharyngeal nodes also are hyper enhancing. Vascular: Major vascular structures in the neck and at the skullbase are patent. Carotid atherosclerosis in the neck without stenosis. Limited intracranial: Negative. Visualized orbits: Negative. Mastoids and visualized paranasal sinuses: Visualized  paranasal sinuses and mastoids are stable and well pneumatized. Skeleton: Dental periapical lucency at the posterior right mandible molar. No other acute dental findings. Average for age cervical spine degeneration. No acute osseous abnormality identified. Upper chest: Reported separately today.  Lung apices are clear. Other: None. IMPRESSION: 1. Generalized pharyngitis and laryngitis with pharyngeal mucosal space edema including mild involvement of the epiglottis. Mild parapharyngeal and retropharyngeal inflammation. Mucosal hyper enhancement at the adenoids and also in the cervical esophagus - consider upper esophagitis. 2. Reactive appearing hyper enhancing cervical lymph nodes diffusely, maximal at the level 1 and level 2 stations. 3. No cystic or necrotic nodes.  No neck abscess. 4. Chest CT today reported separately. Electronically Signed   By: Odessa Fleming M.D.   On: 07/18/2016 15:23   Ct Chest W Contrast  Result Date: 07/18/2016 CLINICAL DATA:  Upper respiratory infection diagnosed yesterday. Worsening swelling and difficulty eating. Shortness of breath and cough. EXAM: CT CHEST WITH CONTRAST TECHNIQUE: Multidetector CT imaging of the chest was performed during intravenous contrast administration. CONTRAST:  75mL ISOVUE-300 IOPAMIDOL (ISOVUE-300) INJECTION 61% COMPARISON:  Chest radiography 07/17/2016 FINDINGS: Cardiovascular: There is aortic atherosclerosis. No aneurysm or dissection. Heart size is normal. No pericardial fluid. The study was not done as a pulmonary arteriogram study, but no pulmonary emboli are identified. Mediastinum/Nodes: No pathologically enlarged hilar or mediastinal lymph nodes. No esophageal mass. There could be wall thickening of the upper thoracic esophagus as might be seen with esophagitis. Lungs/Pleura: Background pattern of mild emphysema. There is patchy infiltrate in the posterior left lower lobe consistent with mild bronchopneumonia. There is more extensive patchy density in  volume loss in the right lower lobe and minimal involvement of the right middle lobe consistent with bronchopneumonia. No pleural effusion. Upper Abdomen: No acute finding. Musculoskeletal: Bilateral breast implants.  No spinal abnormality. IMPRESSION: Patchy pulmonary density consistent with bronchopneumonia, most pronounced in the right lower lobe but also affecting the left lower lobe and to a minimal extent the right middle lobe. No pleural effusion.  No lymphadenopathy. Question upper thoracic esophageal wall thickening that could go along with esophagitis. This is not definite. Electronically Signed   By: Paulina Fusi M.D.   On: 07/18/2016 15:20   US Renal  Result Date: 07/19/2016 CLINICAL DATA:  Acute urinary retention EXAM: RENAL / URINARY TRACT ULTRASOUND COMPLETE COMPARISON:  None. FINDINGS: Right Kidney: Length: 11.2 cm. Echogenicity within normal limits. No mass or hydronephrosis visualized. Left Kidney: Length: 10.7 cm.  1.6 cm cyst is noted in the upper pole Bladder: Decompressed by Foley catheter. Note is made of a tiny amount of free fluid in the right abdomen adjacent to the right kidney. IMPRESSION: Left renal cyst. Minimal trace ascites. Electronically Signed   By: Alcide Clever M.D.   On: 07/19/2016 15:28   Dg Chest Port 1 View  Result Date: 07/19/2016 CLINICAL DATA:  Shortness of breath. EXAM: PORTABLE CHEST 1 VIEW COMPARISON:  CT 07/18/2016. FINDINGS: Persistent mild bibasilar infiltrates, unchanged from prior CT. Heart size stable. Small left pleural effusion cannot be excluded. No pneumothorax. IMPRESSION: Persistent mild bibasilar infiltrates, unchanged from prior CT of 07/18/2016. Electronically Signed   By: Maisie Fushomas  Register   On: 07/19/2016 06:49    Assessment & Plan:   Lissa Merlinanci was seen today for pneumonia.  Diagnoses and all orders for this visit:  Community acquired pneumonia, unspecified laterality -     CBC w/Diff; Future -     Basic Metabolic Panel (BMET); Future -      DG Chest 2 View; Future -     levofloxacin (LEVAQUIN) 500 MG tablet; Take 1 tablet (500 mg total) by mouth daily.  Type 2 diabetes mellitus with hyperosmolarity without coma, without long-term current use of insulin (HCC) -     Hemoglobin A1c; Future -     Basic Metabolic Panel (BMET); Future -     repaglinide (PRANDIN) 1 MG tablet; Take 1 tablet (1 mg total) by mouth 3 (three) times daily before meals.  Osteoporosis without current pathological fracture, unspecified osteoporosis type -     Vitamin D (25 hydroxy); Future -     calcium carbonate (OSCAL) 1500 (600 Ca) MG TABS tablet; Take 1 tablet (1,500 mg total) by mouth 2 (two) times daily with a meal.  Vitamin D deficiency -     Vitamin D (25 hydroxy); Future  Elevated troponin level -     Ambulatory referral to Cardiology  Leukocytosis, unspecified type -     levofloxacin (LEVAQUIN) 500 MG tablet; Take 1 tablet (500 mg total) by mouth daily. -     CBC w/Diff; Future   I have discontinued Ms. Lumpkin's levofloxacin. I am also having her start on calcium carbonate, repaglinide, and levofloxacin. Additionally, I am having her maintain her aspirin, HYDROmorphone, metFORMIN, cholecalciferol, zoster vaccine live (PF), carbidopa-levodopa, promethazine, tolterodine, Vitamin D (Ergocalciferol), mirtazapine, LORazepam, zolpidem, acidophilus, and metoprolol tartrate.  Meds ordered this encounter  Medications  . DISCONTD: levofloxacin (LEVAQUIN) 500 MG tablet  . calcium carbonate (OSCAL) 1500 (600 Ca) MG TABS tablet    Sig: Take 1 tablet (1,500 mg total) by mouth 2 (two) times daily with a meal.    Dispense:  60 tablet    Refill:  4    Order Specific Question:   Supervising Provider    Answer:   Tresa GarterPLOTNIKOV, ALEKSEI V [[id number]]  . repaglinide (PRANDIN) 1 MG tablet    Sig: Take 1 tablet (1 mg total) by mouth 3 (three) times daily before meals.    Dispense:  90 tablet    Refill:  3    Order Specific Question:   Supervising Provider    Answer:    Tresa GarterPLOTNIKOV, ALEKSEI V [[id number]]  . levofloxacin (LEVAQUIN) 500 MG tablet    Sig: Take 1 tablet (500 mg total) by mouth daily.    Dispense:  7 tablet    Refill:  0    Order Specific Question:   Supervising Provider    Answer:   Tresa GarterPLOTNIKOV, ALEKSEI V [[id number]]    Follow-up: Return if symptoms worsen or fail to improve.  Alysia Pennaharlotte Jeovanny Cuadros, NP

## 2016-07-31 NOTE — Assessment & Plan Note (Addendum)
Diabetes uncontrolled with hemoglobin A1c of 9.8, and serum glucose of 379. Continue metformin 500 mg 3 times a day. Due to history of noncompliance, we'll try to manage diabetes with oral agents. Start Prandin times a day with meals.

## 2016-07-31 NOTE — Patient Instructions (Addendum)
CXR indicates improving pneumonia. Due to leukocytosis, prescribed levaquin. Repeat CBC in 1week. Go to hospital if develops fever, worsening Sob or chest pain.  You will be called with information about prolia insurance coverage. You will be called with appt to cardiologist.  Maintain upcoming appt with Dr. Posey ReaPlotnikov.

## 2016-07-31 NOTE — Progress Notes (Signed)
Pre visit review using our clinic review tool, if applicable. No additional management support is needed unless otherwise documented below in the visit note. 

## 2016-07-31 NOTE — Assessment & Plan Note (Signed)
Concerned about possible malignant process. Will repeat CBC in 1 week. Referred to hematologist if persistent leukocytosis.

## 2016-07-31 NOTE — Telephone Encounter (Signed)
Received call from the lab, Critical WBC at 20.8

## 2016-08-01 ENCOUNTER — Other Ambulatory Visit: Payer: Self-pay | Admitting: Internal Medicine

## 2016-08-01 ENCOUNTER — Ambulatory Visit (INDEPENDENT_AMBULATORY_CARE_PROVIDER_SITE_OTHER)
Admission: RE | Admit: 2016-08-01 | Discharge: 2016-08-01 | Disposition: A | Discharge status: Home / Self Care | Payer: Medicare Other | Source: Ambulatory Visit | Attending: Nurse Practitioner | Admitting: Nurse Practitioner

## 2016-08-01 DIAGNOSIS — J440 Chronic obstructive pulmonary disease with acute lower respiratory infection: Secondary | ICD-10-CM | POA: Diagnosis not present

## 2016-08-01 DIAGNOSIS — J189 Pneumonia, unspecified organism: Secondary | ICD-10-CM

## 2016-08-01 DIAGNOSIS — E1165 Type 2 diabetes mellitus with hyperglycemia: Secondary | ICD-10-CM | POA: Diagnosis not present

## 2016-08-01 DIAGNOSIS — J029 Acute pharyngitis, unspecified: Secondary | ICD-10-CM | POA: Diagnosis not present

## 2016-08-01 DIAGNOSIS — I69354 Hemiplegia and hemiparesis following cerebral infarction affecting left non-dominant side: Secondary | ICD-10-CM | POA: Diagnosis not present

## 2016-08-01 DIAGNOSIS — G40909 Epilepsy, unspecified, not intractable, without status epilepticus: Secondary | ICD-10-CM | POA: Diagnosis not present

## 2016-08-02 ENCOUNTER — Other Ambulatory Visit: Payer: Self-pay

## 2016-08-02 DIAGNOSIS — I69354 Hemiplegia and hemiparesis following cerebral infarction affecting left non-dominant side: Secondary | ICD-10-CM | POA: Diagnosis not present

## 2016-08-02 DIAGNOSIS — J440 Chronic obstructive pulmonary disease with acute lower respiratory infection: Secondary | ICD-10-CM | POA: Diagnosis not present

## 2016-08-02 DIAGNOSIS — J189 Pneumonia, unspecified organism: Secondary | ICD-10-CM | POA: Diagnosis not present

## 2016-08-02 DIAGNOSIS — R112 Nausea with vomiting, unspecified: Secondary | ICD-10-CM

## 2016-08-02 DIAGNOSIS — E1165 Type 2 diabetes mellitus with hyperglycemia: Secondary | ICD-10-CM | POA: Diagnosis not present

## 2016-08-02 DIAGNOSIS — J441 Chronic obstructive pulmonary disease with (acute) exacerbation: Secondary | ICD-10-CM

## 2016-08-02 DIAGNOSIS — J029 Acute pharyngitis, unspecified: Secondary | ICD-10-CM | POA: Diagnosis not present

## 2016-08-02 DIAGNOSIS — G40909 Epilepsy, unspecified, not intractable, without status epilepticus: Secondary | ICD-10-CM | POA: Diagnosis not present

## 2016-08-02 MED ORDER — ALBUTEROL SULFATE HFA 108 (90 BASE) MCG/ACT IN AERS: 1.0000 | INHALATION_SPRAY | Freq: Four times a day (QID) | RESPIRATORY_TRACT | 3 refills | Status: DC | PRN

## 2016-08-02 MED ORDER — PROMETHAZINE HCL 12.5 MG PO TABS: 12.5000 mg | ORAL_TABLET | Freq: Four times a day (QID) | ORAL | 0 refills | Status: DC | PRN

## 2016-08-06 DIAGNOSIS — J440 Chronic obstructive pulmonary disease with acute lower respiratory infection: Secondary | ICD-10-CM | POA: Diagnosis not present

## 2016-08-06 DIAGNOSIS — J029 Acute pharyngitis, unspecified: Secondary | ICD-10-CM | POA: Diagnosis not present

## 2016-08-06 DIAGNOSIS — E1165 Type 2 diabetes mellitus with hyperglycemia: Secondary | ICD-10-CM | POA: Diagnosis not present

## 2016-08-06 DIAGNOSIS — G40909 Epilepsy, unspecified, not intractable, without status epilepticus: Secondary | ICD-10-CM | POA: Diagnosis not present

## 2016-08-06 DIAGNOSIS — I69354 Hemiplegia and hemiparesis following cerebral infarction affecting left non-dominant side: Secondary | ICD-10-CM | POA: Diagnosis not present

## 2016-08-06 DIAGNOSIS — J189 Pneumonia, unspecified organism: Secondary | ICD-10-CM | POA: Diagnosis not present

## 2016-08-07 DIAGNOSIS — Z79891 Long term (current) use of opiate analgesic: Secondary | ICD-10-CM | POA: Diagnosis not present

## 2016-08-07 DIAGNOSIS — M25512 Pain in left shoulder: Secondary | ICD-10-CM | POA: Diagnosis not present

## 2016-08-07 DIAGNOSIS — R51 Headache: Secondary | ICD-10-CM | POA: Diagnosis not present

## 2016-08-07 DIAGNOSIS — M47816 Spondylosis without myelopathy or radiculopathy, lumbar region: Secondary | ICD-10-CM | POA: Diagnosis not present

## 2016-08-07 DIAGNOSIS — G894 Chronic pain syndrome: Secondary | ICD-10-CM | POA: Diagnosis not present

## 2016-08-08 DIAGNOSIS — G40909 Epilepsy, unspecified, not intractable, without status epilepticus: Secondary | ICD-10-CM | POA: Diagnosis not present

## 2016-08-08 DIAGNOSIS — J440 Chronic obstructive pulmonary disease with acute lower respiratory infection: Secondary | ICD-10-CM | POA: Diagnosis not present

## 2016-08-08 DIAGNOSIS — J029 Acute pharyngitis, unspecified: Secondary | ICD-10-CM | POA: Diagnosis not present

## 2016-08-08 DIAGNOSIS — E1165 Type 2 diabetes mellitus with hyperglycemia: Secondary | ICD-10-CM | POA: Diagnosis not present

## 2016-08-08 DIAGNOSIS — J189 Pneumonia, unspecified organism: Secondary | ICD-10-CM | POA: Diagnosis not present

## 2016-08-08 DIAGNOSIS — I69354 Hemiplegia and hemiparesis following cerebral infarction affecting left non-dominant side: Secondary | ICD-10-CM | POA: Diagnosis not present

## 2016-08-09 ENCOUNTER — Ambulatory Visit (INDEPENDENT_AMBULATORY_CARE_PROVIDER_SITE_OTHER): Payer: Medicare Other | Admitting: Cardiology

## 2016-08-09 ENCOUNTER — Encounter: Payer: Self-pay | Admitting: Cardiology

## 2016-08-09 VITALS — BP 132/70 | HR 74 | Ht 65.0 in | Wt 137.8 lb

## 2016-08-09 DIAGNOSIS — R748 Abnormal levels of other serum enzymes: Secondary | ICD-10-CM | POA: Diagnosis not present

## 2016-08-09 DIAGNOSIS — R0602 Shortness of breath: Secondary | ICD-10-CM | POA: Diagnosis not present

## 2016-08-09 DIAGNOSIS — R7989 Other specified abnormal findings of blood chemistry: Principal | ICD-10-CM

## 2016-08-09 DIAGNOSIS — R778 Other specified abnormalities of plasma proteins: Secondary | ICD-10-CM

## 2016-08-09 NOTE — Progress Notes (Signed)
08/09/2016 [name]   1951/01/03  [id number]  Primary Physician Sonda PrimesAlex Plotnikov, MD Primary Cardiologist: New   Referring MD: Dr. Randol KernElgergawy, Hospitalitis    Reason for Visit/CC: New Patient Evaluation for Elevated Troponin   HPI:  [name] is a 65 year old female, who presents as a new patient for evaluation for an elevated troponin. Her cardiac risk factors are + for poorly controlled T2DM, HLD, 15 year h/o tobacco abuse (recently quit in October), and family h/o CAD (father died from MI at age 66, paternal grandmother with MI at 9364). 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   She was recently admitted to [facility] by IM 07/18/16 for acute hypoxic respiratory failure in the setting of sepsis, CAP, fever and pharyngitis. She was treated by IM and placed on antibiotics. Troponin's were cycled and were abnormal at 0.51>>0.92>>1.02. EKG showed sinus tachycardia, low voltage and non-specific ST depression in lateral leads.  She denied CP. She was actually seen by Dr. Algie CofferKadakia, cardiologist, who recommended a 2D echo. This showed normal LVEF at 55-60%. Wall motion was normal. Grade 1DD was noted. It was felt that her troponin elevation was likely demand ischemia in the setting of sepsis and PNA. It was recommended that she f/u in outpatient clinic for further w/u. She was somehow [facility] and not Dr. Algie CofferKadakia.   She has done fairly well since discharge. She continues to deny CP. No further dyspnea. She has completed her course of antibiotics. EKG shows NSR. BP is well controlled at 132/70.    Current Meds  Medication Sig  . acidophilus (RISAQUAD) CAPS capsule Take 2 capsules by mouth daily.  Marland Kitchen. albuterol (PROVENTIL HFA;VENTOLIN HFA) 108 (90 Base) MCG/ACT inhaler Inhale 1-2 puffs into the lungs every 6 (six) hours as needed for wheezing or shortness of breath.  Marland Kitchen. aspirin 81 MG EC tablet Take 81 mg by mouth daily.    . calcium carbonate (OSCAL) 1500  (600 Ca) MG TABS tablet Take 1 tablet (1,500 mg total) by mouth 2 (two) times daily with a meal.  . carbidopa-levodopa (SINEMET IR) 25-250 MG tablet Take 1 tablet by mouth 3 (three) times daily.  . cholecalciferol (VITAMIN D) 1000 UNITS tablet Take 2 tablets (2,000 Units total) by mouth daily.  Marland Kitchen. HYDROmorphone (DILAUDID) 2 MG tablet Take 2-3 tablets (4-6 mg total) by mouth every 6 (six) hours as needed for moderate pain or severe pain. (Patient taking differently: Take 2 mg by mouth every 8 (eight) hours as needed for moderate pain or severe pain. )  . LORazepam (ATIVAN) 2 MG tablet Take 0.5-1 tablets (1-2 mg total) by mouth 3 (three) times daily. (Patient taking differently: Take 2 mg by mouth 3 (three) times daily. )  . metFORMIN (GLUCOPHAGE-XR) 500 MG 24 hr tablet TAKE 1 TABLET THREE TIMES A DAY  . metoprolol tartrate (LOPRESSOR) 25 MG tablet Take 0.5 tablets (12.5 mg total) by mouth 2 (two) times daily.  . mirtazapine (REMERON) 15 MG tablet TAKE 1 TABLET BY MOUTH AT BEDTIME AT 6-8 PM  . promethazine (PHENERGAN) 12.5 MG tablet Take 1 tablet (12.5 mg total) by mouth every 6 (six) hours as needed. for nausea  . promethazine (PHENERGAN) 12.5 MG tablet Take 1 tablet (12.5 mg total) by mouth every 6 (six) hours as needed. for nausea  . repaglinide (PRANDIN) 1 MG tablet Take 1 tablet (1 mg total) by mouth 3 (three) times daily before meals.  .Marland Kitchen  tolterodine (DETROL LA) 4 MG 24 hr capsule TAKE ONE CAPSULE AT BEDTIME  . Vitamin D, Ergocalciferol, (DRISDOL) 50000 units CAPS capsule TAKE 1 CAPSULE (50,000 UNITS TOTAL) BY MOUTH ONCE A WEEK.  Marland Kitchen. zolpidem (AMBIEN) 10 MG tablet Take 1 tablet (10 mg total) by mouth at bedtime as needed. for sleep (Patient taking differently: Take 10 mg by mouth at bedtime. for sleep)  . zoster vaccine live, PF, (ZOSTAVAX) [id number] UNT/0.65ML injection Inject 19,400 Units into the skin once.   Allergies  Allergen Reactions  . Alendronate Sodium   . Asa [Aspirin] Other (See Comments)     "stomach burning", "I can take baby aspirin"  . Fluoxetine Hcl    Past Medical History:  Diagnosis Date  . Anoxic brain injury (HCC)   . Anxiety   . Chronic neck pain   . Chronic pain in shoulder   . COPD (chronic obstructive pulmonary disease) (HCC)   . Depression   . Fracture of left humerus 06/24/2014  . Gait disorder   . GERD (gastroesophageal reflux disease)   . Hernia of abdominal cavity   . 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 Hyperlipidemia   . LBP (low back pain)   . Neuropathy (HCC)   . Osteoarthritis   . Seizure disorder (HCC)   . Seizures (HCC)   . Type II or unspecified type diabetes mellitus without mention of complication, not stated as uncontrolled 2010  . Vitamin B 12 deficiency 2011   Family History  Problem Relation Age of Onset  . Heart disease Father 4365    MI  . Hypertension Other    Past Surgical History:  Procedure Laterality Date  . COLONOSCOPY    . DIAGNOSTIC LAPAROSCOPY     PMH: Exploratory lap  . HERNIA REPAIR     incisional  . ORIF HUMERUS FRACTURE Left 06/24/2014   Procedure: LEFT OPEN REDUCTION INTERNAL FIXATION (ORIF) PROXIMAL HUMERUS FRACTURE;  Surgeon: Eulas PostJoshua P Landau, MD;  Location: [facility] OR;  Service: Orthopedics;  Laterality: Left;  . PANCREATECTOMY    . TONSILLECTOMY    . TUBAL LIGATION     Social History   Social History  . Marital status: Married    Spouse name: N/A  . Number of children: N/A  . Years of education: N/A   Occupational History  . disabled    Social History Main Topics  . Smoking status: Current Every Day Smoker    Packs/day: 1.00    Years: 20.00    Types: Cigarettes  . Smokeless tobacco: Never Used  . Alcohol use 6.0 oz/week    10 Glasses of wine per week     Comment: 1 glass of wine daily  . Drug use: No  . Sexual activity: Yes   Other Topics Concern  . Not on file   Social History Narrative   Lives with husband in a one story home.  Has a son and a  daughter but the daughter passed away at the age of 66.  On disability.  College degree from [employer].  She was a Engineer, siteclothing buyer.     Review of Systems: General: negative for chills, fever, night sweats or weight changes.  Cardiovascular: negative for chest pain, dyspnea on exertion, edema, orthopnea, palpitations, paroxysmal nocturnal dyspnea or shortness of breath Dermatological: negative for rash Respiratory: negative for cough or wheezing Urologic: negative for hematuria Abdominal: negative for nausea, vomiting, diarrhea, bright red blood per rectum, melena, or hematemesis Neurologic: negative for  visual changes, syncope, or dizziness All other systems reviewed and are otherwise negative except as noted above.   Physical Exam:  Blood pressure 132/70, pulse 74, height 5\' 5"  (1.651 m), weight 137 lb 12.8 oz (62.5 kg), SpO2 93 %.  General appearance: alert, cooperative and no distress Neck: no carotid bruit and no JVD Lungs: clear to auscultation bilaterally Heart: regular rate and rhythm, S1, S2 normal, no murmur, click, rub or gallop Extremities: extremities normal, atraumatic, no cyanosis or edema Pulses: 2+ and symmetric Skin: Skin color, texture, turgor normal. No rashes or lesions Neurologic: Grossly normal  EKG NSR. No ischemia.   ASSESSMENT AND PLAN:   1. Abnormal Troponin: cardiac enzymes were elevated during recent hospitalization. Echo showed normal LVEF with normal wall motion. She has had no CP. Her dyspnea was in the setting of PNA, which has resolved. EKG shows NSR w/o ischemia. Suspect troponin elevation was likely in the setting of demand ischemia from sepsis and PNA, however given her cardiac risk factors including poorly controlled DM (hgb A1c of 8.8), HLD, tobacco use and family history of CAD, we will arrange for a lexiscan NST to r/o underlying coronary ischemia. I have discussed case with Dr. Elberta Fortis, DOD. He agrees with NST. He has also seen the patient and discussed  plan. If stress test is abnormal, will bring patient back to discuss cath. If low risk, she can continue routine f/u with her PCP and f/u with [place] PRN.     Robbie Lis PA-C 08/09/2016 3:19 PM

## 2016-08-09 NOTE — Patient Instructions (Signed)
Medication Instructions:  Your physician recommends that you continue on your current medications as directed. Please refer to the Current Medication list given to you today.   Labwork: None Ordered   Testing/Procedures: Your physician has requested that you have a lexiscan myoview. For further information please visit [email or web address]. Please follow instruction sheet, as given.    Follow-Up: Your physician recommends that you schedule a follow-up appointment in: as needed     If you need a refill on your cardiac medications before your next appointment, please call your pharmacy.   Thank you for choosing [facility]! Eligha BridegroomMichelle Swinyer, RN [phone]

## 2016-08-13 DIAGNOSIS — G40909 Epilepsy, unspecified, not intractable, without status epilepticus: Secondary | ICD-10-CM | POA: Diagnosis not present

## 2016-08-13 DIAGNOSIS — J440 Chronic obstructive pulmonary disease with acute lower respiratory infection: Secondary | ICD-10-CM | POA: Diagnosis not present

## 2016-08-13 DIAGNOSIS — J029 Acute pharyngitis, unspecified: Secondary | ICD-10-CM | POA: Diagnosis not present

## 2016-08-13 DIAGNOSIS — E1165 Type 2 diabetes mellitus with hyperglycemia: Secondary | ICD-10-CM | POA: Diagnosis not present

## 2016-08-13 DIAGNOSIS — I69354 Hemiplegia and hemiparesis following cerebral infarction affecting left non-dominant side: Secondary | ICD-10-CM | POA: Diagnosis not present

## 2016-08-13 DIAGNOSIS — J189 Pneumonia, unspecified organism: Secondary | ICD-10-CM | POA: Diagnosis not present

## 2016-08-16 ENCOUNTER — Telehealth: Payer: Self-pay

## 2016-08-16 DIAGNOSIS — J440 Chronic obstructive pulmonary disease with acute lower respiratory infection: Secondary | ICD-10-CM | POA: Diagnosis not present

## 2016-08-16 DIAGNOSIS — E1165 Type 2 diabetes mellitus with hyperglycemia: Secondary | ICD-10-CM | POA: Diagnosis not present

## 2016-08-16 DIAGNOSIS — J029 Acute pharyngitis, unspecified: Secondary | ICD-10-CM | POA: Diagnosis not present

## 2016-08-16 DIAGNOSIS — I69354 Hemiplegia and hemiparesis following cerebral infarction affecting left non-dominant side: Secondary | ICD-10-CM | POA: Diagnosis not present

## 2016-08-16 DIAGNOSIS — G40909 Epilepsy, unspecified, not intractable, without status epilepticus: Secondary | ICD-10-CM | POA: Diagnosis not present

## 2016-08-16 DIAGNOSIS — J189 Pneumonia, unspecified organism: Secondary | ICD-10-CM | POA: Diagnosis not present

## 2016-08-16 NOTE — Telephone Encounter (Signed)
Advised patient that insurance has been verified for prolia Injections, estimated $250 copay for each injections---patient wants to think about it and call back to schedule on nurse visit if she decides to start injections

## 2016-08-20 DIAGNOSIS — J189 Pneumonia, unspecified organism: Secondary | ICD-10-CM | POA: Diagnosis not present

## 2016-08-20 DIAGNOSIS — G40909 Epilepsy, unspecified, not intractable, without status epilepticus: Secondary | ICD-10-CM | POA: Diagnosis not present

## 2016-08-20 DIAGNOSIS — J440 Chronic obstructive pulmonary disease with acute lower respiratory infection: Secondary | ICD-10-CM | POA: Diagnosis not present

## 2016-08-20 DIAGNOSIS — J029 Acute pharyngitis, unspecified: Secondary | ICD-10-CM | POA: Diagnosis not present

## 2016-08-20 DIAGNOSIS — I69354 Hemiplegia and hemiparesis following cerebral infarction affecting left non-dominant side: Secondary | ICD-10-CM | POA: Diagnosis not present

## 2016-08-20 DIAGNOSIS — E1165 Type 2 diabetes mellitus with hyperglycemia: Secondary | ICD-10-CM | POA: Diagnosis not present

## 2016-08-27 ENCOUNTER — Other Ambulatory Visit: Payer: Self-pay | Admitting: Internal Medicine

## 2016-08-27 DIAGNOSIS — E1165 Type 2 diabetes mellitus with hyperglycemia: Secondary | ICD-10-CM | POA: Diagnosis not present

## 2016-08-27 DIAGNOSIS — J440 Chronic obstructive pulmonary disease with acute lower respiratory infection: Secondary | ICD-10-CM | POA: Diagnosis not present

## 2016-08-27 DIAGNOSIS — J189 Pneumonia, unspecified organism: Secondary | ICD-10-CM | POA: Diagnosis not present

## 2016-08-27 DIAGNOSIS — J029 Acute pharyngitis, unspecified: Secondary | ICD-10-CM | POA: Diagnosis not present

## 2016-08-27 DIAGNOSIS — I69354 Hemiplegia and hemiparesis following cerebral infarction affecting left non-dominant side: Secondary | ICD-10-CM | POA: Diagnosis not present

## 2016-08-27 DIAGNOSIS — G40909 Epilepsy, unspecified, not intractable, without status epilepticus: Secondary | ICD-10-CM | POA: Diagnosis not present

## 2016-08-28 ENCOUNTER — Telehealth (HOSPITAL_COMMUNITY): Payer: Self-pay | Admitting: *Deleted

## 2016-08-28 NOTE — Telephone Encounter (Signed)
Patient given detailed instructions per Myocardial Perfusion Study Information Sheet for the test on 08/30/16 at 1000. Patient notified to arrive 15 minutes early and that it is imperative to arrive on time for appointment to keep from having the test rescheduled.  If you need to cancel or reschedule your appointment, please call the office within 24 hours of your appointment. Failure to do so may result in a cancellation of your appointment, and a $50 no show fee. Patient verbalized understanding.Edouard Gikas, Adelene IdlerCynthia W

## 2016-08-30 ENCOUNTER — Ambulatory Visit (HOSPITAL_COMMUNITY): Payer: Medicare Other | Attending: Internal Medicine

## 2016-08-30 DIAGNOSIS — R748 Abnormal levels of other serum enzymes: Secondary | ICD-10-CM | POA: Insufficient documentation

## 2016-08-30 DIAGNOSIS — R778 Other specified abnormalities of plasma proteins: Secondary | ICD-10-CM

## 2016-08-30 DIAGNOSIS — R0602 Shortness of breath: Secondary | ICD-10-CM

## 2016-08-30 DIAGNOSIS — R7989 Other specified abnormal findings of blood chemistry: Secondary | ICD-10-CM

## 2016-08-30 LAB — MYOCARDIAL PERFUSION IMAGING
LV dias vol: 53 mL (ref 46–106)
LV sys vol: 25 mL
Peak HR: 105 {beats}/min
RATE: 0.28
Rest HR: 82 {beats}/min
SDS: 5
SRS: 6
SSS: 11
TID: 0.82

## 2016-08-30 MED ORDER — TECHNETIUM TC 99M TETROFOSMIN IV KIT
33.0000 | PACK | Freq: Once | INTRAVENOUS | Status: AC | PRN
  Administered 2016-08-30: 33 via INTRAVENOUS
  Filled 2016-08-30: qty 33

## 2016-08-30 MED ORDER — REGADENOSON 0.4 MG/5ML IV SOLN
0.4000 mg | Freq: Once | INTRAVENOUS | Status: AC
  Administered 2016-08-30: 0.4 mg via INTRAVENOUS

## 2016-08-30 MED ORDER — TECHNETIUM TC 99M TETROFOSMIN IV KIT
10.2000 | PACK | Freq: Once | INTRAVENOUS | Status: AC | PRN
  Administered 2016-08-30: 10.2 via INTRAVENOUS
  Filled 2016-08-30: qty 11

## 2016-08-31 DIAGNOSIS — G40909 Epilepsy, unspecified, not intractable, without status epilepticus: Secondary | ICD-10-CM | POA: Diagnosis not present

## 2016-08-31 DIAGNOSIS — J029 Acute pharyngitis, unspecified: Secondary | ICD-10-CM | POA: Diagnosis not present

## 2016-08-31 DIAGNOSIS — J189 Pneumonia, unspecified organism: Secondary | ICD-10-CM | POA: Diagnosis not present

## 2016-08-31 DIAGNOSIS — I69354 Hemiplegia and hemiparesis following cerebral infarction affecting left non-dominant side: Secondary | ICD-10-CM | POA: Diagnosis not present

## 2016-08-31 DIAGNOSIS — E1165 Type 2 diabetes mellitus with hyperglycemia: Secondary | ICD-10-CM | POA: Diagnosis not present

## 2016-08-31 DIAGNOSIS — J440 Chronic obstructive pulmonary disease with acute lower respiratory infection: Secondary | ICD-10-CM | POA: Diagnosis not present

## 2016-09-03 DIAGNOSIS — G40909 Epilepsy, unspecified, not intractable, without status epilepticus: Secondary | ICD-10-CM | POA: Diagnosis not present

## 2016-09-03 DIAGNOSIS — J029 Acute pharyngitis, unspecified: Secondary | ICD-10-CM | POA: Diagnosis not present

## 2016-09-03 DIAGNOSIS — J189 Pneumonia, unspecified organism: Secondary | ICD-10-CM | POA: Diagnosis not present

## 2016-09-03 DIAGNOSIS — E1165 Type 2 diabetes mellitus with hyperglycemia: Secondary | ICD-10-CM | POA: Diagnosis not present

## 2016-09-03 DIAGNOSIS — J440 Chronic obstructive pulmonary disease with acute lower respiratory infection: Secondary | ICD-10-CM | POA: Diagnosis not present

## 2016-09-03 DIAGNOSIS — I69354 Hemiplegia and hemiparesis following cerebral infarction affecting left non-dominant side: Secondary | ICD-10-CM | POA: Diagnosis not present

## 2016-09-05 DIAGNOSIS — E1165 Type 2 diabetes mellitus with hyperglycemia: Secondary | ICD-10-CM | POA: Diagnosis not present

## 2016-09-05 DIAGNOSIS — J440 Chronic obstructive pulmonary disease with acute lower respiratory infection: Secondary | ICD-10-CM | POA: Diagnosis not present

## 2016-09-05 DIAGNOSIS — I69354 Hemiplegia and hemiparesis following cerebral infarction affecting left non-dominant side: Secondary | ICD-10-CM | POA: Diagnosis not present

## 2016-09-05 DIAGNOSIS — G40909 Epilepsy, unspecified, not intractable, without status epilepticus: Secondary | ICD-10-CM | POA: Diagnosis not present

## 2016-09-05 DIAGNOSIS — J189 Pneumonia, unspecified organism: Secondary | ICD-10-CM | POA: Diagnosis not present

## 2016-09-05 DIAGNOSIS — M47816 Spondylosis without myelopathy or radiculopathy, lumbar region: Secondary | ICD-10-CM | POA: Diagnosis not present

## 2016-09-05 DIAGNOSIS — Z79891 Long term (current) use of opiate analgesic: Secondary | ICD-10-CM | POA: Diagnosis not present

## 2016-09-05 DIAGNOSIS — J029 Acute pharyngitis, unspecified: Secondary | ICD-10-CM | POA: Diagnosis not present

## 2016-09-05 DIAGNOSIS — G894 Chronic pain syndrome: Secondary | ICD-10-CM | POA: Diagnosis not present

## 2016-09-10 ENCOUNTER — Other Ambulatory Visit: Payer: Self-pay | Admitting: Internal Medicine

## 2016-09-10 DIAGNOSIS — I69354 Hemiplegia and hemiparesis following cerebral infarction affecting left non-dominant side: Secondary | ICD-10-CM | POA: Diagnosis not present

## 2016-09-10 DIAGNOSIS — J029 Acute pharyngitis, unspecified: Secondary | ICD-10-CM | POA: Diagnosis not present

## 2016-09-10 DIAGNOSIS — J189 Pneumonia, unspecified organism: Secondary | ICD-10-CM | POA: Diagnosis not present

## 2016-09-10 DIAGNOSIS — G40909 Epilepsy, unspecified, not intractable, without status epilepticus: Secondary | ICD-10-CM | POA: Diagnosis not present

## 2016-09-10 DIAGNOSIS — J440 Chronic obstructive pulmonary disease with acute lower respiratory infection: Secondary | ICD-10-CM | POA: Diagnosis not present

## 2016-09-10 DIAGNOSIS — E1165 Type 2 diabetes mellitus with hyperglycemia: Secondary | ICD-10-CM | POA: Diagnosis not present

## 2016-09-13 DIAGNOSIS — I69354 Hemiplegia and hemiparesis following cerebral infarction affecting left non-dominant side: Secondary | ICD-10-CM | POA: Diagnosis not present

## 2016-09-13 DIAGNOSIS — E1165 Type 2 diabetes mellitus with hyperglycemia: Secondary | ICD-10-CM | POA: Diagnosis not present

## 2016-09-13 DIAGNOSIS — G40909 Epilepsy, unspecified, not intractable, without status epilepticus: Secondary | ICD-10-CM | POA: Diagnosis not present

## 2016-09-13 DIAGNOSIS — J189 Pneumonia, unspecified organism: Secondary | ICD-10-CM | POA: Diagnosis not present

## 2016-09-13 DIAGNOSIS — J440 Chronic obstructive pulmonary disease with acute lower respiratory infection: Secondary | ICD-10-CM | POA: Diagnosis not present

## 2016-09-13 DIAGNOSIS — J029 Acute pharyngitis, unspecified: Secondary | ICD-10-CM | POA: Diagnosis not present

## 2016-09-17 DIAGNOSIS — J440 Chronic obstructive pulmonary disease with acute lower respiratory infection: Secondary | ICD-10-CM | POA: Diagnosis not present

## 2016-09-17 DIAGNOSIS — J029 Acute pharyngitis, unspecified: Secondary | ICD-10-CM | POA: Diagnosis not present

## 2016-09-17 DIAGNOSIS — I69354 Hemiplegia and hemiparesis following cerebral infarction affecting left non-dominant side: Secondary | ICD-10-CM | POA: Diagnosis not present

## 2016-09-17 DIAGNOSIS — J189 Pneumonia, unspecified organism: Secondary | ICD-10-CM | POA: Diagnosis not present

## 2016-09-17 DIAGNOSIS — E1165 Type 2 diabetes mellitus with hyperglycemia: Secondary | ICD-10-CM | POA: Diagnosis not present

## 2016-09-17 DIAGNOSIS — G40909 Epilepsy, unspecified, not intractable, without status epilepticus: Secondary | ICD-10-CM | POA: Diagnosis not present

## 2016-09-19 ENCOUNTER — Telehealth: Payer: Self-pay | Admitting: Emergency Medicine

## 2016-09-19 DIAGNOSIS — J189 Pneumonia, unspecified organism: Secondary | ICD-10-CM | POA: Diagnosis not present

## 2016-09-19 DIAGNOSIS — J029 Acute pharyngitis, unspecified: Secondary | ICD-10-CM | POA: Diagnosis not present

## 2016-09-19 DIAGNOSIS — I69354 Hemiplegia and hemiparesis following cerebral infarction affecting left non-dominant side: Secondary | ICD-10-CM | POA: Diagnosis not present

## 2016-09-19 DIAGNOSIS — J440 Chronic obstructive pulmonary disease with acute lower respiratory infection: Secondary | ICD-10-CM | POA: Diagnosis not present

## 2016-09-19 DIAGNOSIS — E1165 Type 2 diabetes mellitus with hyperglycemia: Secondary | ICD-10-CM | POA: Diagnosis not present

## 2016-09-19 DIAGNOSIS — G40909 Epilepsy, unspecified, not intractable, without status epilepticus: Secondary | ICD-10-CM | POA: Diagnosis not present

## 2016-09-19 NOTE — Telephone Encounter (Signed)
[facility] called and wants to know if they can get verbal orders to continue home health Pt. Please advise thanks.

## 2016-09-20 ENCOUNTER — Telehealth: Payer: Self-pay | Admitting: Internal Medicine

## 2016-09-20 NOTE — Telephone Encounter (Signed)
Routing to dr plotnikov---i'm not showing any glucose meters or strips on patient's med list---are you ok with sending in rx and if yes, I need to know which name of glucose meter to send in

## 2016-09-20 NOTE — Telephone Encounter (Signed)
Spoke with pt to schedule annual wellness visit.  She asked if an order can be [facility]ed to  [facility] her sugar meter and scripts. Please give patient a call.

## 2016-09-20 NOTE — Telephone Encounter (Signed)
Ok one touch verio - can give Rickie a machine that we have Thx

## 2016-09-20 NOTE — Telephone Encounter (Signed)
Ok Thx 

## 2016-09-21 NOTE — Telephone Encounter (Signed)
Notified Kelly w/ MD response./lmb 

## 2016-09-22 DIAGNOSIS — F329 Major depressive disorder, single episode, unspecified: Secondary | ICD-10-CM | POA: Diagnosis not present

## 2016-09-22 DIAGNOSIS — I69354 Hemiplegia and hemiparesis following cerebral infarction affecting left non-dominant side: Secondary | ICD-10-CM | POA: Diagnosis not present

## 2016-09-22 DIAGNOSIS — M150 Primary generalized (osteo)arthritis: Secondary | ICD-10-CM | POA: Diagnosis not present

## 2016-09-22 DIAGNOSIS — F419 Anxiety disorder, unspecified: Secondary | ICD-10-CM | POA: Diagnosis not present

## 2016-09-22 DIAGNOSIS — K219 Gastro-esophageal reflux disease without esophagitis: Secondary | ICD-10-CM | POA: Diagnosis not present

## 2016-09-22 DIAGNOSIS — Z87891 Personal history of nicotine dependence: Secondary | ICD-10-CM | POA: Diagnosis not present

## 2016-09-22 DIAGNOSIS — Z7984 Long term (current) use of oral hypoglycemic drugs: Secondary | ICD-10-CM | POA: Diagnosis not present

## 2016-09-22 DIAGNOSIS — G40909 Epilepsy, unspecified, not intractable, without status epilepticus: Secondary | ICD-10-CM | POA: Diagnosis not present

## 2016-09-22 DIAGNOSIS — I10 Essential (primary) hypertension: Secondary | ICD-10-CM | POA: Diagnosis not present

## 2016-09-22 DIAGNOSIS — J440 Chronic obstructive pulmonary disease with acute lower respiratory infection: Secondary | ICD-10-CM | POA: Diagnosis not present

## 2016-09-22 DIAGNOSIS — J189 Pneumonia, unspecified organism: Secondary | ICD-10-CM | POA: Diagnosis not present

## 2016-09-22 DIAGNOSIS — J029 Acute pharyngitis, unspecified: Secondary | ICD-10-CM | POA: Diagnosis not present

## 2016-09-22 DIAGNOSIS — E1165 Type 2 diabetes mellitus with hyperglycemia: Secondary | ICD-10-CM | POA: Diagnosis not present

## 2016-10-02 DIAGNOSIS — E1165 Type 2 diabetes mellitus with hyperglycemia: Secondary | ICD-10-CM | POA: Diagnosis not present

## 2016-10-02 DIAGNOSIS — I69354 Hemiplegia and hemiparesis following cerebral infarction affecting left non-dominant side: Secondary | ICD-10-CM | POA: Diagnosis not present

## 2016-10-02 DIAGNOSIS — J029 Acute pharyngitis, unspecified: Secondary | ICD-10-CM | POA: Diagnosis not present

## 2016-10-02 DIAGNOSIS — J189 Pneumonia, unspecified organism: Secondary | ICD-10-CM | POA: Diagnosis not present

## 2016-10-02 DIAGNOSIS — G40909 Epilepsy, unspecified, not intractable, without status epilepticus: Secondary | ICD-10-CM | POA: Diagnosis not present

## 2016-10-02 DIAGNOSIS — J440 Chronic obstructive pulmonary disease with acute lower respiratory infection: Secondary | ICD-10-CM | POA: Diagnosis not present

## 2016-10-03 ENCOUNTER — Encounter: Payer: Self-pay | Admitting: Internal Medicine

## 2016-10-03 ENCOUNTER — Ambulatory Visit (INDEPENDENT_AMBULATORY_CARE_PROVIDER_SITE_OTHER): Payer: Medicare Other | Admitting: Internal Medicine

## 2016-10-03 VITALS — BP 120/70 | HR 79 | Wt 137.0 lb

## 2016-10-03 DIAGNOSIS — R269 Unspecified abnormalities of gait and mobility: Secondary | ICD-10-CM

## 2016-10-03 DIAGNOSIS — D72829 Elevated white blood cell count, unspecified: Secondary | ICD-10-CM

## 2016-10-03 DIAGNOSIS — E785 Hyperlipidemia, unspecified: Secondary | ICD-10-CM

## 2016-10-03 DIAGNOSIS — I872 Venous insufficiency (chronic) (peripheral): Secondary | ICD-10-CM | POA: Diagnosis not present

## 2016-10-03 DIAGNOSIS — J441 Chronic obstructive pulmonary disease with (acute) exacerbation: Secondary | ICD-10-CM | POA: Diagnosis not present

## 2016-10-03 DIAGNOSIS — E1100 Type 2 diabetes mellitus with hyperosmolarity without nonketotic hyperglycemic-hyperosmolar coma (NKHHC): Secondary | ICD-10-CM

## 2016-10-03 DIAGNOSIS — E538 Deficiency of other specified B group vitamins: Secondary | ICD-10-CM

## 2016-10-03 MED ORDER — ONETOUCH LANCETS MISC: 1.0000 | Freq: Two times a day (BID) | 11 refills | Status: DC

## 2016-10-03 MED ORDER — LORAZEPAM 2 MG PO TABS: 1.0000 mg | ORAL_TABLET | Freq: Three times a day (TID) | ORAL | 3 refills | Status: DC

## 2016-10-03 MED ORDER — GLUCOSE BLOOD VI STRP: ORAL_STRIP | 5 refills | Status: DC

## 2016-10-03 MED ORDER — B COMPLEX PO TABS: 1.0000 | ORAL_TABLET | Freq: Every day | ORAL | 3 refills | Status: DC

## 2016-10-03 MED ORDER — UMECLIDINIUM-VILANTEROL 62.5-25 MCG/INH IN AEPB: 1.0000 | INHALATION_SPRAY | Freq: Every day | RESPIRATORY_TRACT | 3 refills | Status: DC

## 2016-10-03 NOTE — Assessment & Plan Note (Addendum)
On Metformin, Prandin 

## 2016-10-03 NOTE — Progress Notes (Signed)
Pre visit review using our clinic review tool, if applicable. No additional management support is needed unless otherwise documented below in the visit note. 

## 2016-10-03 NOTE — Assessment & Plan Note (Signed)
Quit smoking in Oct 18th 2017 Anoro qd x 1-2 mo

## 2016-10-03 NOTE — Assessment & Plan Note (Signed)
CBC

## 2016-10-03 NOTE — Assessment & Plan Note (Signed)
Socks 

## 2016-10-03 NOTE — Assessment & Plan Note (Signed)
Re-start B12 

## 2016-10-03 NOTE — Progress Notes (Signed)
Subjective:  Patient ID: [name], female    DOB: [birth year]  Age: 66 y.o. MRN: [id number]  CC: No chief complaint on file.   HPI [name] presents for cough, nausea. Quit smoking in Oct 18th 2017. F/u anxiety   Outpatient Medications Prior to Visit  Medication Sig Dispense Refill  . acidophilus (RISAQUAD) CAPS capsule Take 2 capsules by mouth daily. 20 capsule 0  . albuterol (PROVENTIL HFA;VENTOLIN HFA) 108 (90 Base) MCG/ACT inhaler Inhale 1-2 puffs into the lungs every 6 (six) hours as needed for wheezing or shortness of breath. 1 Inhaler 3  . aspirin 81 MG EC tablet Take 81 mg by mouth daily.      . calcium carbonate (OSCAL) 1500 (600 Ca) MG TABS tablet Take 1 tablet (1,500 mg total) by mouth 2 (two) times daily with a meal. 60 tablet 4  . carbidopa-levodopa (SINEMET IR) 25-250 MG tablet TAKE 1 TABLET BY MOUTH 3 (THREE) TIMES DAILY. 270 tablet 3  . cholecalciferol (VITAMIN D) 1000 UNITS tablet Take 2 tablets (2,000 Units total) by mouth daily. 100 tablet 3  . HYDROmorphone (DILAUDID) 2 MG tablet Take 2-3 tablets (4-6 mg total) by mouth every 6 (six) hours as needed for moderate pain or severe pain. (Patient taking differently: Take 2 mg by mouth every 8 (eight) hours as needed for moderate pain or severe pain. ) 75 tablet 0  . metFORMIN (GLUCOPHAGE-XR) 500 MG 24 hr tablet TAKE 1 TABLET THREE TIMES A DAY 270 tablet 1  . metoprolol tartrate (LOPRESSOR) 25 MG tablet TAKE ONE-HALF TABLET (12.5 MG) BY MOUTH TWO TIMES DAILY 90 tablet 2  . mirtazapine (REMERON) 15 MG tablet TAKE 1 TABLET BY MOUTH AT BEDTIME AT 6-8 PM 90 tablet 3  . promethazine (PHENERGAN) 12.5 MG tablet Take 1 tablet (12.5 mg total) by mouth every 6 (six) hours as needed. for nausea 60 tablet 1  . promethazine (PHENERGAN) 12.5 MG tablet Take 1 tablet (12.5 mg total) by mouth every 6 (six) hours as needed. for nausea 60 tablet 0  . repaglinide (PRANDIN) 1 MG tablet Take 1 tablet (1 mg total) by mouth 3 (three) times  daily before meals. 90 tablet 3  . tolterodine (DETROL LA) 4 MG 24 hr capsule TAKE ONE CAPSULE AT BEDTIME 90 capsule 3  . Vitamin D, Ergocalciferol, (DRISDOL) 50000 units CAPS capsule TAKE 1 CAPSULE (50,000 UNITS TOTAL) BY MOUTH ONCE A WEEK. 6 capsule 0  . zolpidem (AMBIEN) 10 MG tablet Take 1 tablet (10 mg total) by mouth at bedtime as needed. for sleep (Patient taking differently: Take 10 mg by mouth at bedtime. for sleep) 30 tablet 2  . zoster vaccine live, PF, (ZOSTAVAX) 40981 UNT/0.65ML injection Inject 19,400 Units into the skin once. 1 each 0  . LORazepam (ATIVAN) 2 MG tablet Take 0.5-1 tablets (1-2 mg total) by mouth 3 (three) times daily. (Patient taking differently: Take 2 mg by mouth 3 (three) times daily. ) 90 tablet 3   No facility-administered medications prior to visit.     ROS Review of Systems  Constitutional: Negative for activity change, appetite change, chills, fatigue and unexpected weight change.  HENT: Negative for congestion, mouth sores and sinus pressure.   Eyes: Negative for visual disturbance.  Respiratory: Negative for cough and chest tightness.   Cardiovascular: Negative for chest pain.  Gastrointestinal: Positive for nausea. Negative for abdominal pain.  Genitourinary: Negative for difficulty urinating, frequency and vaginal pain.  Musculoskeletal: Positive for arthralgias, gait problem, neck  pain and neck stiffness. Negative for back pain.  Skin: Negative for pallor and rash.  Neurological: Negative for dizziness, tremors, weakness, numbness and headaches.  Psychiatric/Behavioral: Positive for decreased concentration. Negative for confusion, sleep disturbance and suicidal ideas. The patient is nervous/anxious.     Objective:  BP 120/70   Pulse 79   Wt 137 lb (62.1 kg)   SpO2 95%   BMI 22.80 kg/m   BP Readings from Last 3 Encounters:  10/03/16 120/70  08/09/16 132/70  07/31/16 118/74    Wt Readings from Last 3 Encounters:  10/03/16 137 lb (62.1  kg)  08/30/16 137 lb (62.1 kg)  08/09/16 137 lb 12.8 oz (62.5 kg)    Physical Exam  Constitutional: She appears well-developed. No distress.  HENT:  Head: Normocephalic.  Right Ear: External ear normal.  Left Ear: External ear normal.  Nose: Nose normal.  Mouth/Throat: Oropharynx is clear and moist.  Eyes: Conjunctivae are normal. Pupils are equal, round, and reactive to light. Right eye exhibits no discharge. Left eye exhibits no discharge.  Neck: Normal range of motion. Neck supple. No JVD present. No tracheal deviation present. No thyromegaly present.  Cardiovascular: Normal rate, regular rhythm and normal heart sounds.   Pulmonary/Chest: No stridor. No respiratory distress. She has no wheezes.  Abdominal: Soft. Bowel sounds are normal. She exhibits no distension and no mass. There is no tenderness. There is no rebound and no guarding.  Musculoskeletal: She exhibits tenderness. She exhibits no edema.  Lymphadenopathy:    She has no cervical adenopathy.  Neurological: She displays normal reflexes. No cranial nerve deficit. She exhibits normal muscle tone. Coordination abnormal.  Skin: No rash noted. No erythema.  Psychiatric: She has a normal mood and affect. Her behavior is normal. Judgment and thought content normal.  in a w/c  Lab Results  Component Value Date   WBC 20.8 Repeated and verified X2. (HH) 07/31/2016   HGB 13.6 07/31/2016   HCT 40.3 07/31/2016   PLT 414.0 (H) 07/31/2016   GLUCOSE 379 (H) 07/31/2016   CHOL 203 (H) 03/11/2015   TRIG 88.0 03/11/2015   HDL 72.20 03/11/2015   LDLDIRECT 100.8 10/26/2013   LDLCALC 113 (H) 03/11/2015   ALT 8 (L) 07/18/2016   AST 18 07/18/2016   NA 132 (L) 07/31/2016   K 4.5 07/31/2016   CL 93 (L) 07/31/2016   CREATININE 0.56 07/31/2016   BUN 9 07/31/2016   CO2 29 07/31/2016   TSH 1.43 12/28/2015   INR 1.10 07/18/2016   HGBA1C 9.8 (H) 07/31/2016    Dg Chest 2 View  Result Date: 08/01/2016 CLINICAL DATA:  Follow-up of  pneumonia.  No current complaints. EXAM: CHEST  2 VIEW COMPARISON:  Portable chest x-ray of July 19, 2016 and CT scan of the chest dated July 16, 2016. FINDINGS: The lungs are well expanded. There is linear density in the infrahilar region on the right which likely reflects atelectasis or scarring. There is no pleural effusion. The heart and pulmonary vascularity are normal. There is calcification in the wall of the aortic arch. The bony thorax exhibits no acute abnormality. IMPRESSION: Residual atelectasis or infiltrate the in the right infrahilar region likely in the lower lobe. Elsewhere the lungs are clear. There are underlying emphysematous changes. No CHF. Aortic atherosclerosis. Additional follow-up chest x-ray in 3-4 weeks is recommended to assure complete clearing. Electronically Signed   By: David  [place] M.D.   On: 08/01/2016 10:21    Assessment & Plan:   There  are no diagnoses linked to this encounter. I am having Ms. Schryver start on ONE TOUCH LANCETS and glucose blood. I am also having her maintain her aspirin, HYDROmorphone, metFORMIN, cholecalciferol, zoster vaccine live (PF), promethazine, tolterodine, Vitamin D (Ergocalciferol), mirtazapine, zolpidem, acidophilus, calcium carbonate, repaglinide, promethazine, albuterol, carbidopa-levodopa, metoprolol tartrate, and LORazepam.  Meds ordered this encounter  Medications  . LORazepam (ATIVAN) 2 MG tablet    Sig: Take 0.5-1 tablets (1-2 mg total) by mouth 3 (three) times daily.    Dispense:  90 tablet    Refill:  3  . ONE TOUCH LANCETS MISC    Sig: 1 each by Does not apply route 2 (two) times daily.    Dispense:  100 each    Refill:  11  . glucose blood (ONETOUCH VERIO) test strip    Sig: Use as instructed    Dispense:  100 each    Refill:  5     Follow-up: No Follow-up on file.  [name], MD

## 2016-10-03 NOTE — Assessment & Plan Note (Signed)
In a w/c 

## 2016-10-04 DIAGNOSIS — I69354 Hemiplegia and hemiparesis following cerebral infarction affecting left non-dominant side: Secondary | ICD-10-CM | POA: Diagnosis not present

## 2016-10-04 DIAGNOSIS — J029 Acute pharyngitis, unspecified: Secondary | ICD-10-CM | POA: Diagnosis not present

## 2016-10-04 DIAGNOSIS — J440 Chronic obstructive pulmonary disease with acute lower respiratory infection: Secondary | ICD-10-CM | POA: Diagnosis not present

## 2016-10-04 DIAGNOSIS — J189 Pneumonia, unspecified organism: Secondary | ICD-10-CM | POA: Diagnosis not present

## 2016-10-04 DIAGNOSIS — E1165 Type 2 diabetes mellitus with hyperglycemia: Secondary | ICD-10-CM | POA: Diagnosis not present

## 2016-10-04 DIAGNOSIS — G40909 Epilepsy, unspecified, not intractable, without status epilepticus: Secondary | ICD-10-CM | POA: Diagnosis not present

## 2016-10-05 DIAGNOSIS — M47812 Spondylosis without myelopathy or radiculopathy, cervical region: Secondary | ICD-10-CM | POA: Diagnosis not present

## 2016-10-05 DIAGNOSIS — M47816 Spondylosis without myelopathy or radiculopathy, lumbar region: Secondary | ICD-10-CM | POA: Diagnosis not present

## 2016-10-05 DIAGNOSIS — Z79891 Long term (current) use of opiate analgesic: Secondary | ICD-10-CM | POA: Diagnosis not present

## 2016-10-05 DIAGNOSIS — G894 Chronic pain syndrome: Secondary | ICD-10-CM | POA: Diagnosis not present

## 2016-10-08 DIAGNOSIS — G40909 Epilepsy, unspecified, not intractable, without status epilepticus: Secondary | ICD-10-CM | POA: Diagnosis not present

## 2016-10-08 DIAGNOSIS — E1165 Type 2 diabetes mellitus with hyperglycemia: Secondary | ICD-10-CM | POA: Diagnosis not present

## 2016-10-08 DIAGNOSIS — J440 Chronic obstructive pulmonary disease with acute lower respiratory infection: Secondary | ICD-10-CM | POA: Diagnosis not present

## 2016-10-08 DIAGNOSIS — J029 Acute pharyngitis, unspecified: Secondary | ICD-10-CM | POA: Diagnosis not present

## 2016-10-08 DIAGNOSIS — J189 Pneumonia, unspecified organism: Secondary | ICD-10-CM | POA: Diagnosis not present

## 2016-10-08 DIAGNOSIS — I69354 Hemiplegia and hemiparesis following cerebral infarction affecting left non-dominant side: Secondary | ICD-10-CM | POA: Diagnosis not present

## 2016-10-11 DIAGNOSIS — E1165 Type 2 diabetes mellitus with hyperglycemia: Secondary | ICD-10-CM | POA: Diagnosis not present

## 2016-10-11 DIAGNOSIS — G40909 Epilepsy, unspecified, not intractable, without status epilepticus: Secondary | ICD-10-CM | POA: Diagnosis not present

## 2016-10-11 DIAGNOSIS — I69354 Hemiplegia and hemiparesis following cerebral infarction affecting left non-dominant side: Secondary | ICD-10-CM | POA: Diagnosis not present

## 2016-10-11 DIAGNOSIS — J189 Pneumonia, unspecified organism: Secondary | ICD-10-CM | POA: Diagnosis not present

## 2016-10-11 DIAGNOSIS — Z0289 Encounter for other administrative examinations: Secondary | ICD-10-CM | POA: Diagnosis not present

## 2016-10-11 DIAGNOSIS — J440 Chronic obstructive pulmonary disease with acute lower respiratory infection: Secondary | ICD-10-CM | POA: Diagnosis not present

## 2016-10-11 DIAGNOSIS — J029 Acute pharyngitis, unspecified: Secondary | ICD-10-CM | POA: Diagnosis not present

## 2016-10-15 DIAGNOSIS — J189 Pneumonia, unspecified organism: Secondary | ICD-10-CM | POA: Diagnosis not present

## 2016-10-15 DIAGNOSIS — G40909 Epilepsy, unspecified, not intractable, without status epilepticus: Secondary | ICD-10-CM | POA: Diagnosis not present

## 2016-10-15 DIAGNOSIS — J029 Acute pharyngitis, unspecified: Secondary | ICD-10-CM | POA: Diagnosis not present

## 2016-10-15 DIAGNOSIS — E1165 Type 2 diabetes mellitus with hyperglycemia: Secondary | ICD-10-CM | POA: Diagnosis not present

## 2016-10-15 DIAGNOSIS — I69354 Hemiplegia and hemiparesis following cerebral infarction affecting left non-dominant side: Secondary | ICD-10-CM | POA: Diagnosis not present

## 2016-10-15 DIAGNOSIS — J440 Chronic obstructive pulmonary disease with acute lower respiratory infection: Secondary | ICD-10-CM | POA: Diagnosis not present

## 2016-10-22 DIAGNOSIS — G40909 Epilepsy, unspecified, not intractable, without status epilepticus: Secondary | ICD-10-CM | POA: Diagnosis not present

## 2016-10-22 DIAGNOSIS — I69354 Hemiplegia and hemiparesis following cerebral infarction affecting left non-dominant side: Secondary | ICD-10-CM | POA: Diagnosis not present

## 2016-10-22 DIAGNOSIS — E1165 Type 2 diabetes mellitus with hyperglycemia: Secondary | ICD-10-CM | POA: Diagnosis not present

## 2016-10-22 DIAGNOSIS — J029 Acute pharyngitis, unspecified: Secondary | ICD-10-CM | POA: Diagnosis not present

## 2016-10-22 DIAGNOSIS — J440 Chronic obstructive pulmonary disease with acute lower respiratory infection: Secondary | ICD-10-CM | POA: Diagnosis not present

## 2016-10-22 DIAGNOSIS — J189 Pneumonia, unspecified organism: Secondary | ICD-10-CM | POA: Diagnosis not present

## 2016-10-23 ENCOUNTER — Other Ambulatory Visit: Payer: Self-pay | Admitting: *Deleted

## 2016-10-23 MED ORDER — ONETOUCH DELICA LANCETS 33G MISC: 3 refills | Status: DC

## 2016-10-23 MED ORDER — GLUCOSE BLOOD VI STRP: ORAL_STRIP | 5 refills | Status: DC

## 2016-10-23 NOTE — Telephone Encounter (Signed)
Rec'd call from pt caregiver stating pt is needing testing supplies [facility]. Verified which monitor she uses inform will send [facility] verio supplies to [facility].../lmb

## 2016-10-24 DIAGNOSIS — I69354 Hemiplegia and hemiparesis following cerebral infarction affecting left non-dominant side: Secondary | ICD-10-CM | POA: Diagnosis not present

## 2016-10-24 DIAGNOSIS — J440 Chronic obstructive pulmonary disease with acute lower respiratory infection: Secondary | ICD-10-CM | POA: Diagnosis not present

## 2016-10-24 DIAGNOSIS — E1165 Type 2 diabetes mellitus with hyperglycemia: Secondary | ICD-10-CM | POA: Diagnosis not present

## 2016-10-24 DIAGNOSIS — G40909 Epilepsy, unspecified, not intractable, without status epilepticus: Secondary | ICD-10-CM | POA: Diagnosis not present

## 2016-10-24 DIAGNOSIS — J029 Acute pharyngitis, unspecified: Secondary | ICD-10-CM | POA: Diagnosis not present

## 2016-10-24 DIAGNOSIS — J189 Pneumonia, unspecified organism: Secondary | ICD-10-CM | POA: Diagnosis not present

## 2016-10-26 DIAGNOSIS — J029 Acute pharyngitis, unspecified: Secondary | ICD-10-CM | POA: Diagnosis not present

## 2016-10-26 DIAGNOSIS — J189 Pneumonia, unspecified organism: Secondary | ICD-10-CM | POA: Diagnosis not present

## 2016-10-26 DIAGNOSIS — E1165 Type 2 diabetes mellitus with hyperglycemia: Secondary | ICD-10-CM | POA: Diagnosis not present

## 2016-10-26 DIAGNOSIS — J440 Chronic obstructive pulmonary disease with acute lower respiratory infection: Secondary | ICD-10-CM | POA: Diagnosis not present

## 2016-10-29 DIAGNOSIS — E1165 Type 2 diabetes mellitus with hyperglycemia: Secondary | ICD-10-CM | POA: Diagnosis not present

## 2016-10-29 DIAGNOSIS — J189 Pneumonia, unspecified organism: Secondary | ICD-10-CM | POA: Diagnosis not present

## 2016-10-29 DIAGNOSIS — J440 Chronic obstructive pulmonary disease with acute lower respiratory infection: Secondary | ICD-10-CM | POA: Diagnosis not present

## 2016-10-29 DIAGNOSIS — I69354 Hemiplegia and hemiparesis following cerebral infarction affecting left non-dominant side: Secondary | ICD-10-CM | POA: Diagnosis not present

## 2016-10-29 DIAGNOSIS — J029 Acute pharyngitis, unspecified: Secondary | ICD-10-CM | POA: Diagnosis not present

## 2016-10-29 DIAGNOSIS — G40909 Epilepsy, unspecified, not intractable, without status epilepticus: Secondary | ICD-10-CM | POA: Diagnosis not present

## 2016-11-05 ENCOUNTER — Telehealth: Payer: Self-pay | Admitting: Internal Medicine

## 2016-11-05 DIAGNOSIS — G40909 Epilepsy, unspecified, not intractable, without status epilepticus: Secondary | ICD-10-CM | POA: Diagnosis not present

## 2016-11-05 DIAGNOSIS — J440 Chronic obstructive pulmonary disease with acute lower respiratory infection: Secondary | ICD-10-CM | POA: Diagnosis not present

## 2016-11-05 DIAGNOSIS — I69354 Hemiplegia and hemiparesis following cerebral infarction affecting left non-dominant side: Secondary | ICD-10-CM | POA: Diagnosis not present

## 2016-11-05 DIAGNOSIS — J189 Pneumonia, unspecified organism: Secondary | ICD-10-CM | POA: Diagnosis not present

## 2016-11-05 DIAGNOSIS — J029 Acute pharyngitis, unspecified: Secondary | ICD-10-CM | POA: Diagnosis not present

## 2016-11-05 DIAGNOSIS — E1165 Type 2 diabetes mellitus with hyperglycemia: Secondary | ICD-10-CM | POA: Diagnosis not present

## 2016-11-05 NOTE — Telephone Encounter (Signed)
Routing to dr plotnikov----please advise if you are ok with home health doing urine sample or do you need patient to come in----I will call them back, thanks

## 2016-11-05 NOTE — Telephone Encounter (Signed)
Called Tresa EndoKelly, left detail massage of Dr. Macario GoldsPlot verbal Sherilyn Dacostaokey for home health to correct urine.

## 2016-11-05 NOTE — Telephone Encounter (Signed)
States patient possibly has UTI.  Complaining of burning when urinating.  States patients urine has a smell, her back hurts and she has a fever.  Would like to know if can have a nurse come out to do a urine sample or if patient needs to be seen?

## 2016-11-06 ENCOUNTER — Ambulatory Visit (INDEPENDENT_AMBULATORY_CARE_PROVIDER_SITE_OTHER): Payer: Medicare Other | Admitting: Adult Health

## 2016-11-06 ENCOUNTER — Ambulatory Visit: Payer: Medicare Other

## 2016-11-06 ENCOUNTER — Encounter: Payer: Self-pay | Admitting: Adult Health

## 2016-11-06 VITALS — BP 98/62 | Temp 98.8°F | Ht 65.0 in | Wt 138.2 lb

## 2016-11-06 DIAGNOSIS — N3001 Acute cystitis with hematuria: Secondary | ICD-10-CM | POA: Diagnosis not present

## 2016-11-06 LAB — POCT URINALYSIS DIPSTICK
Bilirubin, UA: NEGATIVE
Glucose, UA: NEGATIVE
Ketones, UA: NEGATIVE
Nitrite, UA: NEGATIVE
Spec Grav, UA: 1.02
Urobilinogen, UA: NEGATIVE
pH, UA: 6

## 2016-11-06 MED ORDER — PHENAZOPYRIDINE HCL 200 MG PO TABS: 200.0000 mg | ORAL_TABLET | Freq: Three times a day (TID) | ORAL | 0 refills | Status: DC | PRN

## 2016-11-06 MED ORDER — CIPROFLOXACIN HCL 500 MG PO TABS: 500.0000 mg | ORAL_TABLET | Freq: Two times a day (BID) | ORAL | 0 refills | Status: DC

## 2016-11-06 NOTE — Telephone Encounter (Signed)
Called and left message advising that patient can call back and let us know she would like to use this meter and we can have one waiting on her at front office---we have samples here in office if she would like to try this meter per dr plotnikov's note

## 2016-11-06 NOTE — Patient Instructions (Signed)

## 2016-11-06 NOTE — Progress Notes (Signed)
EAV:WUJWPCP:Janet Plotnikov, MD No chief complaint on file.   Current Issues:  Presents with 2-3  days of dysuria and urinary urgency Associated symptoms include:  Dysuria, bilateral flank pain ,urgency, and fever.   There is no history of of similar symptoms. Sexually active:  No   No concern for STI.  Prior to Admission medications   Medication Sig Start Date End Date Taking? Authorizing Provider  acidophilus (RISAQUAD) CAPS capsule Take 2 capsules by mouth daily. 07/23/16  Yes Starleen Armsawood S Elgergawy, MD  albuterol (PROVENTIL HFA;VENTOLIN HFA) 108 (90 Base) MCG/ACT inhaler Inhale 1-2 puffs into the lungs every 6 (six) hours as needed for wheezing or shortness of breath. 08/02/16  Yes Bonna Gainsharlotte Lum Nche, NP  aspirin 81 MG EC tablet Take 81 mg by mouth daily.     Yes Historical Provider, MD  b complex vitamins tablet Take 1 tablet by mouth daily. 10/03/16  Yes [name] V, MD  calcium carbonate (OSCAL) 1500 (600 Ca) MG TABS tablet Take 1 tablet (1,500 mg total) by mouth 2 (two) times daily with a meal. 07/31/16  Yes Bonna Gainsharlotte Lum Nche, NP  carbidopa-levodopa (SINEMET IR) 25-250 MG tablet TAKE 1 TABLET BY MOUTH 3 (THREE) TIMES DAILY. 08/27/16  Yes [name] V, MD  cholecalciferol (VITAMIN D) 1000 UNITS tablet Take 2 tablets (2,000 Units total) by mouth daily. 03/15/15  Yes [name] V, MD  glucose blood (ONETOUCH VERIO) test strip Use to check blood sugars twice a day. Dx E11.9 10/23/16  Yes [name] V, MD  HYDROmorphone (DILAUDID) 2 MG tablet Take 2-3 tablets (4-6 mg total) by mouth every 6 (six) hours as needed for moderate pain or severe pain. Patient taking differently: Take 2 mg by mouth every 8 (eight) hours as needed for moderate pain or severe pain.  06/24/14  Yes Teryl LucyJoshua Landau, MD  LORazepam (ATIVAN) 2 MG tablet Take 0.5-1 tablets (1-2 mg total) by mouth 3 (three) times daily. 10/03/16  Yes [name] V, MD  metFORMIN (GLUCOPHAGE-XR) 500 MG 24 hr tablet TAKE 1  TABLET THREE TIMES A DAY 02/22/15  Yes [name] V, MD  metoprolol tartrate (LOPRESSOR) 25 MG tablet TAKE ONE-HALF TABLET (12.5 MG) BY MOUTH TWO TIMES DAILY 09/11/16  Yes [name] V, MD  mirtazapine (REMERON) 15 MG tablet TAKE 1 TABLET BY MOUTH AT BEDTIME AT 6-8 PM 05/01/16  Yes [name] V, MD  ONETOUCH DELICA LANCETS 33G MISC Use to help check blood sugars twice a day. Dx E11.9 10/23/16  Yes [name] V, MD  promethazine (PHENERGAN) 12.5 MG tablet Take 1 tablet (12.5 mg total) by mouth every 6 (six) hours as needed. for nausea 08/02/16  Yes Bonna Gainsharlotte Lum Nche, NP  repaglinide (PRANDIN) 1 MG tablet Take 1 tablet (1 mg total) by mouth 3 (three) times daily before meals. 07/31/16  Yes Bonna Gainsharlotte Lum Nche, NP  tolterodine (DETROL LA) 4 MG 24 hr capsule TAKE ONE CAPSULE AT BEDTIME 01/24/16  Yes [name] V, MD  umeclidinium-vilanterol (ANORO ELLIPTA) 62.5-25 MCG/INH AEPB Inhale 1 puff into the lungs daily. 10/03/16  Yes [name] V, MD  Vitamin D, Ergocalciferol, (DRISDOL) 50000 units CAPS capsule TAKE 1 CAPSULE (50,000 UNITS TOTAL) BY MOUTH ONCE A WEEK. 03/28/16  Yes [name] V, MD  zolpidem (AMBIEN) 10 MG tablet Take 1 tablet (10 mg total) by mouth at bedtime as needed. for sleep Patient taking differently: Take 10 mg by mouth at bedtime. for sleep 07/02/16  Yes Janet GarterAleksei Jackson V, MD  zoster vaccine live, PF, (ZOSTAVAX) 16109 UNT/0.65ML injection Inject 19,400 Units into the skin once. 05/26/15  Yes Janet Garter, MD    Review of Systems:Negative unless mentioned above   PE:  BP 98/62   Temp 98.8 F (37.1 C) (Oral)   Ht 5\' 5"  (1.651 m)   Wt 138 lb 3.2 oz (62.7 kg)   BMI 23.00 kg/m  Constitutional: Alert and oriented. Does not appear in any acute distress Heart: Normal rate and normal rhythm  Lungs: Clear to auscultation  Back" No CVA tenderness Abdomen: No pain with palpation    Results for orders placed or performed in visit on  08/30/16  Myocardial Perfusion Imaging  Result Value Ref Range   Rest HR 82 bpm   Rest BP 139/63 mmHg   Peak HR 105 bpm   Peak BP 138/82 mmHg   SSS 11    SRS 6    SDS 5    LHR 0.28    TID 0.82    LV sys vol 25 mL   LV dias vol 53 46 - 106 mL    Assessment and Plan:   1. Acute cystitis with hematuria  - POCT urinalysis dipstick - 3+ Leuks. + blood and protein  - Urine culture - ciprofloxacin (CIPRO) 500 MG tablet; Take 1 tablet (500 mg total) by mouth 2 (two) times daily.  Dispense: 10 tablet; Refill: 0 - phenazopyridine (PYRIDIUM) 200 MG tablet; Take 1 tablet (200 mg total) by mouth 3 (three) times daily as needed for pain.  Dispense: 10 tablet; Refill: 0 - stay hydrated - Follow up with PCP if no resolution   Shirline Frees, NP

## 2016-11-08 ENCOUNTER — Inpatient Hospital Stay (HOSPITAL_COMMUNITY): Payer: Medicare Other

## 2016-11-08 ENCOUNTER — Encounter (HOSPITAL_COMMUNITY): Payer: Self-pay | Admitting: Emergency Medicine

## 2016-11-08 ENCOUNTER — Telehealth: Payer: Self-pay | Admitting: Internal Medicine

## 2016-11-08 ENCOUNTER — Inpatient Hospital Stay (HOSPITAL_COMMUNITY)
Admission: EM | Admit: 2016-11-08 | Discharge: 2016-11-10 | DRG: 872 | Disposition: A | Discharge status: Home Health | Payer: Medicare Other | Attending: Internal Medicine | Admitting: Internal Medicine

## 2016-11-08 ENCOUNTER — Emergency Department (HOSPITAL_COMMUNITY): Payer: Medicare Other

## 2016-11-08 DIAGNOSIS — E876 Hypokalemia: Secondary | ICD-10-CM | POA: Diagnosis present

## 2016-11-08 DIAGNOSIS — J449 Chronic obstructive pulmonary disease, unspecified: Secondary | ICD-10-CM | POA: Diagnosis present

## 2016-11-08 DIAGNOSIS — F419 Anxiety disorder, unspecified: Secondary | ICD-10-CM | POA: Diagnosis present

## 2016-11-08 DIAGNOSIS — I959 Hypotension, unspecified: Secondary | ICD-10-CM | POA: Diagnosis present

## 2016-11-08 DIAGNOSIS — N390 Urinary tract infection, site not specified: Secondary | ICD-10-CM | POA: Diagnosis not present

## 2016-11-08 DIAGNOSIS — I10 Essential (primary) hypertension: Secondary | ICD-10-CM | POA: Diagnosis not present

## 2016-11-08 DIAGNOSIS — E860 Dehydration: Secondary | ICD-10-CM | POA: Diagnosis present

## 2016-11-08 DIAGNOSIS — M810 Age-related osteoporosis without current pathological fracture: Secondary | ICD-10-CM | POA: Diagnosis present

## 2016-11-08 DIAGNOSIS — A419 Sepsis, unspecified organism: Secondary | ICD-10-CM | POA: Diagnosis not present

## 2016-11-08 DIAGNOSIS — A4151 Sepsis due to Escherichia coli [E. coli]: Secondary | ICD-10-CM | POA: Diagnosis not present

## 2016-11-08 DIAGNOSIS — R338 Other retention of urine: Secondary | ICD-10-CM | POA: Diagnosis present

## 2016-11-08 DIAGNOSIS — E1142 Type 2 diabetes mellitus with diabetic polyneuropathy: Secondary | ICD-10-CM | POA: Diagnosis present

## 2016-11-08 DIAGNOSIS — N179 Acute kidney failure, unspecified: Secondary | ICD-10-CM | POA: Diagnosis not present

## 2016-11-08 DIAGNOSIS — Z7982 Long term (current) use of aspirin: Secondary | ICD-10-CM | POA: Diagnosis not present

## 2016-11-08 DIAGNOSIS — B9620 Unspecified Escherichia coli [E. coli] as the cause of diseases classified elsewhere: Secondary | ICD-10-CM

## 2016-11-08 DIAGNOSIS — Z993 Dependence on wheelchair: Secondary | ICD-10-CM | POA: Diagnosis not present

## 2016-11-08 DIAGNOSIS — G40909 Epilepsy, unspecified, not intractable, without status epilepticus: Secondary | ICD-10-CM | POA: Diagnosis present

## 2016-11-08 DIAGNOSIS — G8929 Other chronic pain: Secondary | ICD-10-CM | POA: Diagnosis present

## 2016-11-08 DIAGNOSIS — Z87891 Personal history of nicotine dependence: Secondary | ICD-10-CM

## 2016-11-08 DIAGNOSIS — J9811 Atelectasis: Secondary | ICD-10-CM | POA: Diagnosis present

## 2016-11-08 DIAGNOSIS — Z79899 Other long term (current) drug therapy: Secondary | ICD-10-CM | POA: Diagnosis not present

## 2016-11-08 DIAGNOSIS — R000 Tachycardia, unspecified: Secondary | ICD-10-CM | POA: Diagnosis present

## 2016-11-08 DIAGNOSIS — M545 Low back pain: Secondary | ICD-10-CM | POA: Diagnosis present

## 2016-11-08 DIAGNOSIS — R6520 Severe sepsis without septic shock: Secondary | ICD-10-CM | POA: Diagnosis present

## 2016-11-08 DIAGNOSIS — R0902 Hypoxemia: Secondary | ICD-10-CM | POA: Diagnosis present

## 2016-11-08 DIAGNOSIS — I69354 Hemiplegia and hemiparesis following cerebral infarction affecting left non-dominant side: Secondary | ICD-10-CM

## 2016-11-08 DIAGNOSIS — E118 Type 2 diabetes mellitus with unspecified complications: Secondary | ICD-10-CM | POA: Diagnosis not present

## 2016-11-08 DIAGNOSIS — E119 Type 2 diabetes mellitus without complications: Secondary | ICD-10-CM

## 2016-11-08 DIAGNOSIS — J9601 Acute respiratory failure with hypoxia: Secondary | ICD-10-CM

## 2016-11-08 DIAGNOSIS — Z7984 Long term (current) use of oral hypoglycemic drugs: Secondary | ICD-10-CM

## 2016-11-08 DIAGNOSIS — E872 Acidosis: Secondary | ICD-10-CM | POA: Diagnosis present

## 2016-11-08 DIAGNOSIS — R112 Nausea with vomiting, unspecified: Secondary | ICD-10-CM | POA: Diagnosis not present

## 2016-11-08 DIAGNOSIS — E1100 Type 2 diabetes mellitus with hyperosmolarity without nonketotic hyperglycemic-hyperosmolar coma (NKHHC): Secondary | ICD-10-CM | POA: Diagnosis not present

## 2016-11-08 DIAGNOSIS — Z8673 Personal history of transient ischemic attack (TIA), and cerebral infarction without residual deficits: Secondary | ICD-10-CM | POA: Diagnosis not present

## 2016-11-08 DIAGNOSIS — R509 Fever, unspecified: Secondary | ICD-10-CM | POA: Diagnosis not present

## 2016-11-08 LAB — CBC WITH DIFFERENTIAL/PLATELET
Basophils Absolute: 0 10*3/uL (ref 0.0–0.1)
Basophils Relative: 0 %
Eosinophils Absolute: 0 10*3/uL (ref 0.0–0.7)
Eosinophils Relative: 0 %
HCT: 33.2 % — ABNORMAL LOW (ref 36.0–46.0)
Hemoglobin: 11 g/dL — ABNORMAL LOW (ref 12.0–15.0)
Lymphocytes Relative: 5 %
Lymphs Abs: 0.6 10*3/uL — ABNORMAL LOW (ref 0.7–4.0)
MCH: 29.3 pg (ref 26.0–34.0)
MCHC: 33.1 g/dL (ref 30.0–36.0)
MCV: 88.5 fL (ref 78.0–100.0)
Monocytes Absolute: 1.1 10*3/uL — ABNORMAL HIGH (ref 0.1–1.0)
Monocytes Relative: 11 %
Neutro Abs: 9.1 10*3/uL — ABNORMAL HIGH (ref 1.7–7.7)
Neutrophils Relative %: 84 %
Platelets: 168 10*3/uL (ref 150–400)
RBC: 3.75 MIL/uL — ABNORMAL LOW (ref 3.87–5.11)
RDW: 15.3 % (ref 11.5–15.5)
WBC: 10.8 10*3/uL — ABNORMAL HIGH (ref 4.0–10.5)

## 2016-11-08 LAB — COMPREHENSIVE METABOLIC PANEL
ALT: 7 U/L — ABNORMAL LOW (ref 14–54)
AST: 34 U/L (ref 15–41)
Albumin: 3.1 g/dL — ABNORMAL LOW (ref 3.5–5.0)
Alkaline Phosphatase: 92 U/L (ref 38–126)
Anion gap: 12 (ref 5–15)
BUN: 40 mg/dL — ABNORMAL HIGH (ref 6–20)
CO2: 27 mmol/L (ref 22–32)
Calcium: 7.6 mg/dL — ABNORMAL LOW (ref 8.9–10.3)
Chloride: 89 mmol/L — ABNORMAL LOW (ref 101–111)
Creatinine, Ser: 1.35 mg/dL — ABNORMAL HIGH (ref 0.44–1.00)
GFR calc Af Amer: 46 mL/min — ABNORMAL LOW (ref >60)
GFR calc non Af Amer: 40 mL/min — ABNORMAL LOW (ref >60)
Glucose, Bld: 302 mg/dL — ABNORMAL HIGH (ref 65–99)
Potassium: 3.9 mmol/L (ref 3.5–5.1)
Sodium: 128 mmol/L — ABNORMAL LOW (ref 135–145)
Total Bilirubin: 0.8 mg/dL (ref 0.3–1.2)
Total Protein: 6.1 g/dL — ABNORMAL LOW (ref 6.5–8.1)

## 2016-11-08 LAB — CBC
HCT: 31.5 % — ABNORMAL LOW (ref 36.0–46.0)
Hemoglobin: 10.5 g/dL — ABNORMAL LOW (ref 12.0–15.0)
MCH: 29.7 pg (ref 26.0–34.0)
MCHC: 33.3 g/dL (ref 30.0–36.0)
MCV: 89 fL (ref 78.0–100.0)
Platelets: 155 10*3/uL (ref 150–400)
RBC: 3.54 MIL/uL — ABNORMAL LOW (ref 3.87–5.11)
RDW: 15.4 % (ref 11.5–15.5)
WBC: 9.5 10*3/uL (ref 4.0–10.5)

## 2016-11-08 LAB — INFLUENZA PANEL BY PCR (TYPE A & B)
Influenza A By PCR: NEGATIVE
Influenza B By PCR: NEGATIVE

## 2016-11-08 LAB — URINALYSIS, ROUTINE W REFLEX MICROSCOPIC
Bilirubin Urine: NEGATIVE
Glucose, UA: NEGATIVE mg/dL
Ketones, ur: NEGATIVE mg/dL
Nitrite: POSITIVE — AB
Protein, ur: 100 mg/dL — AB
Specific Gravity, Urine: 1.014 (ref 1.005–1.030)
pH: 5 (ref 5.0–8.0)

## 2016-11-08 LAB — GLUCOSE, CAPILLARY: Glucose-Capillary: 117 mg/dL — ABNORMAL HIGH (ref 65–99)

## 2016-11-08 LAB — APTT: aPTT: 26 seconds (ref 24–36)

## 2016-11-08 LAB — CREATININE, SERUM
Creatinine, Ser: 0.94 mg/dL (ref 0.44–1.00)
GFR calc Af Amer: >60 mL/min (ref >60)
GFR calc non Af Amer: >60 mL/min (ref >60)

## 2016-11-08 LAB — I-STAT CG4 LACTIC ACID, ED
Lactic Acid, Venous: 3.17 mmol/L (ref 0.5–1.9)
Lactic Acid, Venous: 2.63 mmol/L (ref 0.5–1.9)

## 2016-11-08 LAB — PROTIME-INR
INR: 1.12
Prothrombin Time: 14.4 seconds (ref 11.4–15.2)

## 2016-11-08 LAB — LACTIC ACID, PLASMA: Lactic Acid, Venous: 1.6 mmol/L (ref 0.5–1.9)

## 2016-11-08 MED ORDER — VANCOMYCIN HCL IN DEXTROSE 1-5 GM/200ML-% IV SOLN: 1000.0000 mg | INTRAVENOUS | Status: DC

## 2016-11-08 MED ORDER — LORAZEPAM 1 MG PO TABS
1.0000 mg | ORAL_TABLET | Freq: Two times a day (BID) | ORAL | Status: DC
  Administered 2016-11-09 – 2016-11-10 (×4): 1 mg via ORAL
  Filled 2016-11-08 (×4): qty 1

## 2016-11-08 MED ORDER — SODIUM CHLORIDE 0.9 % IV SOLN
INTRAVENOUS | Status: DC
  Administered 2016-11-08 – 2016-11-10 (×4): via INTRAVENOUS

## 2016-11-08 MED ORDER — PIPERACILLIN-TAZOBACTAM 3.375 G IVPB
3.3750 g | Freq: Three times a day (TID) | INTRAVENOUS | Status: DC
  Administered 2016-11-08 – 2016-11-10 (×5): 3.375 g via INTRAVENOUS
  Filled 2016-11-08 (×4): qty 50

## 2016-11-08 MED ORDER — INSULIN ASPART 100 UNIT/ML ~~LOC~~ SOLN
0.0000 [IU] | Freq: Three times a day (TID) | SUBCUTANEOUS | Status: DC
  Administered 2016-11-09: 3 [IU] via SUBCUTANEOUS
  Administered 2016-11-09: 2 [IU] via SUBCUTANEOUS
  Administered 2016-11-10 (×2): 3 [IU] via SUBCUTANEOUS

## 2016-11-08 MED ORDER — SODIUM CHLORIDE 0.9 % IV BOLUS (SEPSIS)
1000.0000 mL | Freq: Once | INTRAVENOUS | Status: AC
  Administered 2016-11-08: 1000 mL via INTRAVENOUS

## 2016-11-08 MED ORDER — LORAZEPAM 1 MG PO TABS
2.0000 mg | ORAL_TABLET | Freq: Every day | ORAL | Status: DC
  Administered 2016-11-09: 2 mg via ORAL
  Filled 2016-11-08 (×2): qty 2

## 2016-11-08 MED ORDER — ASPIRIN EC 81 MG PO TBEC
81.0000 mg | DELAYED_RELEASE_TABLET | Freq: Every day | ORAL | Status: DC
  Administered 2016-11-09 – 2016-11-10 (×2): 81 mg via ORAL
  Filled 2016-11-08 (×2): qty 1

## 2016-11-08 MED ORDER — LORAZEPAM 1 MG PO TABS: 1.0000 mg | ORAL_TABLET | Freq: Three times a day (TID) | ORAL | Status: DC

## 2016-11-08 MED ORDER — ZOLPIDEM TARTRATE 5 MG PO TABS
5.0000 mg | ORAL_TABLET | Freq: Every evening | ORAL | Status: DC | PRN
  Administered 2016-11-09: 5 mg via ORAL
  Filled 2016-11-08: qty 1

## 2016-11-08 MED ORDER — METOPROLOL TARTRATE 12.5 MG HALF TABLET: 12.5000 mg | ORAL_TABLET | Freq: Two times a day (BID) | ORAL | Status: DC

## 2016-11-08 MED ORDER — ALBUTEROL SULFATE (2.5 MG/3ML) 0.083% IN NEBU: 2.5000 mg | INHALATION_SOLUTION | Freq: Four times a day (QID) | RESPIRATORY_TRACT | Status: DC | PRN

## 2016-11-08 MED ORDER — ONDANSETRON HCL 4 MG/2ML IJ SOLN: 4.0000 mg | Freq: Four times a day (QID) | INTRAMUSCULAR | Status: DC | PRN

## 2016-11-08 MED ORDER — ACETAMINOPHEN 325 MG PO TABS: 650.0000 mg | ORAL_TABLET | Freq: Four times a day (QID) | ORAL | Status: DC | PRN

## 2016-11-08 MED ORDER — ONDANSETRON HCL 4 MG PO TABS: 4.0000 mg | ORAL_TABLET | Freq: Four times a day (QID) | ORAL | Status: DC | PRN

## 2016-11-08 MED ORDER — SODIUM CHLORIDE 0.9 % IV BOLUS (SEPSIS)
1000.0000 mL | Freq: Once | INTRAVENOUS | Status: AC
Start: 2016-11-08 — End: 2016-11-08
  Administered 2016-11-08: 1000 mL via INTRAVENOUS

## 2016-11-08 MED ORDER — PIPERACILLIN-TAZOBACTAM 3.375 G IVPB 30 MIN
3.3750 g | Freq: Once | INTRAVENOUS | Status: AC
  Administered 2016-11-08: 3.375 g via INTRAVENOUS
  Filled 2016-11-08: qty 50

## 2016-11-08 MED ORDER — CARBIDOPA-LEVODOPA 25-250 MG PO TABS
1.0000 | ORAL_TABLET | Freq: Three times a day (TID) | ORAL | Status: DC
  Administered 2016-11-09 – 2016-11-10 (×6): 1 via ORAL
  Filled 2016-11-08 (×8): qty 1

## 2016-11-08 MED ORDER — HYDROMORPHONE HCL 2 MG PO TABS
2.0000 mg | ORAL_TABLET | Freq: Three times a day (TID) | ORAL | Status: DC | PRN
  Administered 2016-11-10: 2 mg via ORAL
  Filled 2016-11-08: qty 1

## 2016-11-08 MED ORDER — SENNOSIDES-DOCUSATE SODIUM 8.6-50 MG PO TABS: 1.0000 | ORAL_TABLET | Freq: Every evening | ORAL | Status: DC | PRN

## 2016-11-08 MED ORDER — VANCOMYCIN HCL IN DEXTROSE 1-5 GM/200ML-% IV SOLN
1000.0000 mg | Freq: Once | INTRAVENOUS | Status: AC
  Administered 2016-11-08: 1000 mg via INTRAVENOUS
  Filled 2016-11-08: qty 200

## 2016-11-08 MED ORDER — UMECLIDINIUM-VILANTEROL 62.5-25 MCG/INH IN AEPB
1.0000 | INHALATION_SPRAY | Freq: Every day | RESPIRATORY_TRACT | Status: DC
  Filled 2016-11-08: qty 14

## 2016-11-08 MED ORDER — MIRTAZAPINE 15 MG PO TABS
15.0000 mg | ORAL_TABLET | Freq: Every day | ORAL | Status: DC
  Administered 2016-11-09 (×2): 15 mg via ORAL
  Filled 2016-11-08 (×2): qty 1

## 2016-11-08 MED ORDER — HEPARIN SODIUM (PORCINE) 5000 UNIT/ML IJ SOLN
5000.0000 [IU] | Freq: Three times a day (TID) | INTRAMUSCULAR | Status: DC
  Administered 2016-11-09 – 2016-11-10 (×5): 5000 [IU] via SUBCUTANEOUS
  Filled 2016-11-08 (×5): qty 1

## 2016-11-08 MED ORDER — IPRATROPIUM-ALBUTEROL 0.5-2.5 (3) MG/3ML IN SOLN: 3.0000 mL | Freq: Four times a day (QID) | RESPIRATORY_TRACT | Status: DC | PRN

## 2016-11-08 MED ORDER — ACETAMINOPHEN 650 MG RE SUPP: 650.0000 mg | Freq: Four times a day (QID) | RECTAL | Status: DC | PRN

## 2016-11-08 MED ORDER — ALBUTEROL SULFATE HFA 108 (90 BASE) MCG/ACT IN AERS: 1.0000 | INHALATION_SPRAY | Freq: Four times a day (QID) | RESPIRATORY_TRACT | Status: DC | PRN

## 2016-11-08 MED ORDER — CHLORHEXIDINE GLUCONATE 0.12 % MT SOLN
5.0000 mL | Freq: Two times a day (BID) | OROMUCOSAL | Status: DC
  Administered 2016-11-09 – 2016-11-10 (×2): 5 mL via OROMUCOSAL
  Filled 2016-11-08 (×3): qty 15

## 2016-11-08 NOTE — ED Triage Notes (Signed)
Pt recently dx with UTI. Began with fevers and nonproductive cough over a week ago. Pt states cough worsening, fever present at home, pt with worsening weakness, flu like symptoms, ems noted hypotension.

## 2016-11-08 NOTE — ED Notes (Signed)
Abnormal lab result MD Ranae PalmsYelverton have been made aware

## 2016-11-08 NOTE — H&P (Signed)
History and Physical    [name] HYQ:[id number] DOB: 10/08/1950  DOA: 11/08/2016 PCP: [name], MD  Patient coming from: Home   Chief Complaint: Generalized weakness and dysuria   HPI: [name] is a 66 y.o. female with medical history significant of CVA with residual left side weakness, mostly wheelchair bound, HTN and chronic back pain on Dilaudid. Presented to the ED after have generalize weakness for the past 2 days. Symptoms began on 11/06/16 when she was diagnosed with UTI treated with cipro by PCP. Despite taking antibiotics patient continue to decline. Patient report dysuria, abdominal pain and malaise. Per husband patients temperature were 102, and she was advised to come to the ED   ED Course: In the ED was found to be septic from urinary source. Lactic acid elevated and hypotensive 84/59  which responded to IVF. Urine culture from 2/6 show E coli sensitivities pending.    Review of Systems:   General: no changes in body weight, no fever chills or decrease in energy.  HEENT: no blurry vision, hearing changes or sore throat Respiratory: Positive for cough. No dyspnea, wheezing CV: no chest pain, no palpitations GI: no nausea, vomiting, abdominal pain, diarrhea, constipation GU:See HPI  Ext:. No deformities Neuro: Positive for left side weakness Skin: No rashes, lesions or wounds. MSK: No muscle spasm, no deformity, no limitation of range of movement in spin Heme: No easy bruising.  Travel history: No recent long distant travel.   Past Medical History:  Diagnosis Date  . Anoxic brain injury (HCC)   . Anxiety   . Chronic neck pain   . Chronic pain in shoulder   . COPD (chronic obstructive pulmonary disease) (HCC)   . Depression   . Fracture of left humerus 06/24/2014  . Gait disorder   . GERD (gastroesophageal reflux disease)   . Hernia of abdominal cavity   . 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 Hyperlipidemia   .  LBP (low back pain)   . Neuropathy (HCC)   . Osteoarthritis   . Seizure disorder (HCC)   . Seizures (HCC)   . Type II or unspecified type diabetes mellitus without mention of complication, not stated as uncontrolled 2010  . Vitamin B 12 deficiency 2011    Past Surgical History:  Procedure Laterality Date  . COLONOSCOPY    . DIAGNOSTIC LAPAROSCOPY     PMH: Exploratory lap  . HERNIA REPAIR     incisional  . ORIF HUMERUS FRACTURE Left 06/24/2014   Procedure: LEFT OPEN REDUCTION INTERNAL FIXATION (ORIF) PROXIMAL HUMERUS FRACTURE;  Surgeon: Eulas Post, MD;  Location: [facility] OR;  Service: Orthopedics;  Laterality: Left;  . PANCREATECTOMY    . TONSILLECTOMY    . TUBAL LIGATION       reports that she quit smoking about 3 months ago. Her smoking use included Cigarettes. She has a 20.00 pack-year smoking history. She has never used smokeless tobacco. She reports that she drinks about 6.0 oz of alcohol per week . She reports that she does not use drugs.  Allergies  Allergen Reactions  . Asa [Aspirin] Other (See Comments)    "stomach burning", "I can take baby aspirin"    Family History  Problem Relation Age of Onset  . Heart disease Father 7    MI  . Hypertension Other     Family history reviewed and not pertinent   Prior to Admission medications   Medication Sig Start Date End  Date Taking? Authorizing Provider  albuterol (PROVENTIL HFA;VENTOLIN HFA) 108 (90 Base) MCG/ACT inhaler Inhale 1-2 puffs into the lungs every 6 (six) hours as needed for wheezing or shortness of breath. 08/02/16  Yes Bonna Gains Nche, NP  aspirin 81 MG EC tablet Take 81 mg by mouth daily.     Yes Historical Provider, MD  b complex vitamins tablet Take 1 tablet by mouth daily. 10/03/16  Yes Aleksei Plotnikov V, MD  calcium carbonate (OSCAL) 1500 (600 Ca) MG TABS tablet Take 1 tablet (1,500 mg total) by mouth 2 (two) times daily with a meal. 07/31/16  Yes Bonna Gains Nche, NP  carbidopa-levodopa (SINEMET IR)  25-250 MG tablet TAKE 1 TABLET BY MOUTH 3 (THREE) TIMES DAILY. 08/27/16  Yes Aleksei Plotnikov V, MD  chlorhexidine (PERIDEX) 0.12 % solution RINSE WITH 1/2 OUNCE TWICE DAILY FOR 30 SECONDS AND SPIT OUT AFTER BREAKFAST AND BEFORE BED 10/31/16  Yes Historical Provider, MD  cholecalciferol (VITAMIN D) 1000 UNITS tablet Take 2 tablets (2,000 Units total) by mouth daily. 03/15/15  Yes Aleksei Plotnikov V, MD  ciprofloxacin (CIPRO) 500 MG tablet Take 1 tablet (500 mg total) by mouth 2 (two) times daily. 11/06/16  Yes Shirline Frees, NP  glucose blood (ONETOUCH VERIO) test strip Use to check blood sugars twice a day. Dx E11.9 10/23/16  Yes Aleksei Plotnikov V, MD  HYDROmorphone (DILAUDID) 2 MG tablet Take 2-3 tablets (4-6 mg total) by mouth every 6 (six) hours as needed for moderate pain or severe pain. Patient taking differently: Take 2 mg by mouth every 8 (eight) hours as needed for moderate pain or severe pain.  06/24/14  Yes Teryl Lucy, MD  LORazepam (ATIVAN) 2 MG tablet Take 0.5-1 tablets (1-2 mg total) by mouth 3 (three) times daily. 10/03/16  Yes Aleksei Plotnikov V, MD  metFORMIN (GLUCOPHAGE-XR) 500 MG 24 hr tablet TAKE 1 TABLET THREE TIMES A DAY 02/22/15  Yes Aleksei Plotnikov V, MD  metoprolol tartrate (LOPRESSOR) 25 MG tablet TAKE ONE-HALF TABLET (12.5 MG) BY MOUTH TWO TIMES DAILY 09/11/16  Yes Aleksei Plotnikov V, MD  mirtazapine (REMERON) 15 MG tablet TAKE 1 TABLET BY MOUTH AT BEDTIME AT 6-8 PM 05/01/16  Yes Aleksei Plotnikov V, MD  ONETOUCH DELICA LANCETS 33G MISC Use to help check blood sugars twice a day. Dx E11.9 10/23/16  Yes Aleksei Plotnikov V, MD  phenazopyridine (PYRIDIUM) 200 MG tablet Take 1 tablet (200 mg total) by mouth 3 (three) times daily as needed for pain. 11/06/16  Yes Shirline Frees, NP  promethazine (PHENERGAN) 12.5 MG tablet Take 1 tablet (12.5 mg total) by mouth every 6 (six) hours as needed. for nausea 08/02/16  Yes Bonna Gains Nche, NP  repaglinide (PRANDIN) 1 MG tablet Take 1  tablet (1 mg total) by mouth 3 (three) times daily before meals. 07/31/16  Yes Bonna Gains Nche, NP  tolterodine (DETROL LA) 4 MG 24 hr capsule TAKE ONE CAPSULE AT BEDTIME 01/24/16  Yes Aleksei Plotnikov V, MD  umeclidinium-vilanterol (ANORO ELLIPTA) 62.5-25 MCG/INH AEPB Inhale 1 puff into the lungs daily. 10/03/16  Yes Aleksei Plotnikov V, MD  Vitamin D, Ergocalciferol, (DRISDOL) 50000 units CAPS capsule TAKE 1 CAPSULE (50,000 UNITS TOTAL) BY MOUTH ONCE A WEEK. 03/28/16  Yes Aleksei Plotnikov V, MD  zolpidem (AMBIEN) 10 MG tablet Take 1 tablet (10 mg total) by mouth at bedtime as needed. for sleep Patient taking differently: Take 10 mg by mouth at bedtime. for sleep 07/02/16  Yes Aleksei Plotnikov V, MD  acidophilus (RISAQUAD)  CAPS capsule Take 2 capsules by mouth daily. Patient not taking: Reported on 11/08/2016 07/23/16   Leana Roeawood S Elgergawy, MD  zoster vaccine live, PF, (ZOSTAVAX) [id number] UNT/0.65ML injection Inject 19,400 Units into the skin once. Patient not taking: Reported on 11/08/2016 05/26/15   Tresa GarterAleksei Plotnikov V, MD    Physical Exam: Vitals:   11/08/16 1407 11/08/16 1532 11/08/16 1600 11/08/16 1657  BP: 92/66 96/60 113/55 127/67  Pulse: 93 90 97   Resp: 15 14 18    Temp:    99.6 F (37.6 C)  TempSrc:    Oral  SpO2: 91% 93%    Weight:    62.5 kg (137 lb 12.6 oz)  Height:    5\' 5"  (1.651 m)    Constitutional: NAD, calm, comfortable Eyes: PERRL ENMT: Mucous membranes are moist. Posterior pharynx clear  Neck: normal, supple, no masses, no thyromegaly Respiratory: Good air entry, rales at the b/l bases. No wheezing   Cardiovascular: Regular rate and rhythm, no murmurs / rubs / gallops. No extremity edema. 2+ pedal pulses..  Abdomen: Soft, suprapubic tenderness. Non distended, no CVA.  Musculoskeletal: No joint deformities  Skin: no rashes Neurologic: Left side weakness.  Psychiatric: Normal judgment and insight. Alert and oriented x 3.   Labs on Admission: I have personally reviewed  following labs and imaging studies  CBC:  Recent Labs Lab 11/08/16 1215  WBC 10.8*  NEUTROABS 9.1*  HGB 11.0*  HCT 33.2*  MCV 88.5  PLT 168   Basic Metabolic Panel:  Recent Labs Lab 11/08/16 1215  NA 128*  K 3.9  CL 89*  CO2 27  GLUCOSE 302*  BUN 40*  CREATININE 1.35*  CALCIUM 7.6*   GFR: Estimated Creatinine Clearance: 36.9 mL/min (by C-G formula based on SCr of 1.35 mg/dL (H)). Liver Function Tests:  Recent Labs Lab 11/08/16 1215  AST 34  ALT 7*  ALKPHOS 92  BILITOT 0.8  PROT 6.1*  ALBUMIN 3.1*   Urine analysis:    Component Value Date/Time   COLORURINE AMBER (A) 11/08/2016 1215   APPEARANCEUR CLOUDY (A) 11/08/2016 1215   LABSPEC 1.014 11/08/2016 1215   PHURINE 5.0 11/08/2016 1215   GLUCOSEU NEGATIVE 11/08/2016 1215   GLUCOSEU NEGATIVE 12/24/2012 1626   HGBUR SMALL (A) 11/08/2016 1215   BILIRUBINUR NEGATIVE 11/08/2016 1215   BILIRUBINUR Neg 11/06/2016 1159   KETONESUR NEGATIVE 11/08/2016 1215   PROTEINUR 100 (A) 11/08/2016 1215   UROBILINOGEN negative 11/06/2016 1159   UROBILINOGEN 0.2 12/24/2012 1626   NITRITE POSITIVE (A) 11/08/2016 1215   LEUKOCYTESUR SMALL (A) 11/08/2016 1215   ) Recent Results (from the past 240 hour(s))  Urine culture     Status: None (Preliminary result)   Collection Time: 11/06/16 12:08 PM  Result Value Ref Range Status   Colony Count Greater than 100,000 CFU/mL  Preliminary   Preliminary Report ESCHERICHIA COLI  Preliminary     Radiological Exams on Admission: Dg Chest 2 View  Result Date: 11/08/2016 CLINICAL DATA:  Fever for 3 days EXAM: CHEST  2 VIEW COMPARISON:  08/01/2016 FINDINGS: Minimal bibasilar atelectasis. Heart is normal size. No effusions. No acute bony abnormality appear IMPRESSION: Minimal bibasilar atelectasis. Electronically Signed   By: Charlett NoseKevin  Dover M.D.   On: 11/08/2016 12:37   EKG: Independently reviewed. - Sinus tachy @ 101 bpm, Low voltage, no ST segment changes   Assessment/Plan Severe  Sepsis 2/2 to UTI due to E coli infection - Sepsis criteria tachycardia, elevated wbc, fever and a source of infection. Lactic  acid elevated and low BP that responded to IVF Admit to SDU  IV Abx - Continue Zosyn - can d/c vanco  Continue IVF  Repeat lactic acid in 3 hrs  Follow up blood cultures  Follow up urine cultures sensitivities from 2/6 Monitor I&Os  Acute renal failure - likely from sepsis, dehydration and UTI  Will get renal US to r/o kidney involvement  Continue IVF  Trend Cr  Avoid nephrotoxins   Acute hypoxia - likely from sepsis - CXR with atelectasis  O2 as needed wean as tolerated  Flutter valve   HTN - initially with low BP due to sepsis - now stable  Hold metoprolol  Monitor BP   DM type 2 Hold metformin  SSI  Monitor CBG's   CVA with residual left side weakness  Mostly wheelchair bound - will order PT  Continue ASA   Chronic back pain  Resume home dose Dilaudid   DVT prophylaxis: Heparin  Code Status: FULL  Family Communication: Husband at bedside Disposition Plan: Anticipate discharge to previous home environment.  Consults called: None  Admission status: Inpatient SDU    Latrelle Dodrill MD [facility] Pager: Text Page via [email or web address]  [phone]  If 7PM-7AM, please contact night-coverage [email or web address] Password [email or web address]  11/08/2016, 5:22 PM

## 2016-11-08 NOTE — Telephone Encounter (Signed)
PLEASE NOTE: All timestamps contained within this report are represented as [place] Standard Time. CONFIDENTIALTY NOTICE: This fax transmission is intended only for the addressee. It contains information that is legally privileged, confidential or otherwise protected from use or disclosure. If you are not the intended recipient, you are strictly prohibited from reviewing, disclosing, copying using or disseminating any of this information or taking any action in reliance on or regarding this information. If you have received this fax in error, please notify us immediately by telephone so that we can arrange for its return to us. Phone: [phone], Toll-Free: [phone], Fax: [phone] Page: 1 of 1 Call Id: [id number] [facility] - Client TELEPHONE ADVICE RECORD [facility] Patient Name: Janet KoyanagiANCI Iles DOB: 1951/08/28 Initial Comment Caller states wife has UTI, on Cipro for 3 days, fever of 102.0, and feeling miserable. He doesn't think the medication is helping much. Gait and balance disorder from prior stroke, and taking two to transport her. Nurse Assessment Nurse: Leveda AnnaHensel, RN, Aeriel Date/Time (Eastern Time): 11/08/2016 10:58:32 AM Confirm and document reason for call. If symptomatic, describe symptoms. ---Caller states, pt has a UTI, this is the 3rd day she has been on Cipro. Pt still has a fever it is 101.9(O). PT has a gait issue. Normally she is able to assist with one person. It is taking two people to assist her to get up she is so weak. It took her an hour to eat lunch she is so weak. She isn't really voiding. Does the patient have any new or worsening symptoms? ---Yes Will a triage be completed? ---Yes Related visit to physician within the last 2 weeks? ---Yes Does the PT have any chronic conditions? (i.e. diabetes, asthma, etc.) ---Yes List chronic conditions. ---hx of strokes Is this a behavioral health or substance abuse call?  ---No Guidelines Guideline Title Affirmed Question Affirmed Notes Urinary Tract Infection on Antibiotic Follow-up Call - Female Shock suspected (e.g., cold/pale/clammy skin, too weak to stand, low BP, rapid pulse) Final Disposition User Call EMS 911 Now Hensel, RN, Aeriel Disagree/Comply: Comply

## 2016-11-08 NOTE — ED Notes (Signed)
RN at bedside starting second line will collect both set of culture

## 2016-11-08 NOTE — ED Notes (Signed)
Bed: [id number] Expected date:  Expected time:  Means of arrival:  Comments: 66 yo F UTI, nausea, fever?

## 2016-11-08 NOTE — ED Notes (Signed)
Patient transported to X-ray 

## 2016-11-08 NOTE — ED Notes (Signed)
[facility]

## 2016-11-08 NOTE — Progress Notes (Signed)
Pharmacy Antibiotic Note  [name] is a 66 y.o. [facility] with fever, worsening cough, weakness, and flu-like symptoms. Note patient was recently diagnosed with a UTI on 11/06/16 and was placed on cipro per outpatient provider. Urine culture currently growing > 100K E.coli (susceptibilities still pending). Noted to be hypotensive and febrile in ED as well as with elevated lactic acid. Pharmacy consulted for Vancomycin and Zosyn dosing for sepsis.    Plan: Vancomycin 1g IV q24h. Vancomycin trough level at steady state, as warranted. Zosyn 3.375g IV x 1 over 30 minutes in the ED, then Zosyn 3.375g IV q8h (infuse over 4 hours). Monitor renal function, cultures, clinical course, and for ability to de-escalate (particularly if urinary source only).     Temp (24hrs), Avg:101.6 F (38.7 C), Min:101.6 F (38.7 C), Max:101.6 F (38.7 C)   Recent Labs Lab 11/08/16 1215 11/08/16 1223  WBC 10.8*  --   CREATININE 1.35*  --   LATICACIDVEN  --  3.17*    Estimated Creatinine Clearance: 36.9 mL/min (by C-G formula based on SCr of 1.35 mg/dL (H)).    Allergies  Allergen Reactions  . Alendronate Sodium   . Asa [Aspirin] Other (See Comments)    "stomach burning", "I can take baby aspirin"  . Fluoxetine Hcl     Antimicrobials this admission: PTA Cipro for UTI 2/8 >> Vancomycin >> 2/8 >> Zosyn >>  Dose adjustments this admission: --  Microbiology results: 2/6 UCx: > 100K E.coli, sensitivities pending 2/8 BCx: sent 2/8 UCx: sent  2/8 Influenza PCR: negative   Thank you for allowing pharmacy to be a part of this patient's care.   Greer PickerelJigna Faviola Klare, PharmD, BCPS Pager: [phone] 11/08/2016 1:04 PM

## 2016-11-09 DIAGNOSIS — N390 Urinary tract infection, site not specified: Secondary | ICD-10-CM

## 2016-11-09 DIAGNOSIS — N179 Acute kidney failure, unspecified: Secondary | ICD-10-CM

## 2016-11-09 DIAGNOSIS — I10 Essential (primary) hypertension: Secondary | ICD-10-CM

## 2016-11-09 DIAGNOSIS — R6520 Severe sepsis without septic shock: Secondary | ICD-10-CM

## 2016-11-09 DIAGNOSIS — Z8673 Personal history of transient ischemic attack (TIA), and cerebral infarction without residual deficits: Secondary | ICD-10-CM

## 2016-11-09 DIAGNOSIS — A419 Sepsis, unspecified organism: Secondary | ICD-10-CM

## 2016-11-09 DIAGNOSIS — E876 Hypokalemia: Secondary | ICD-10-CM

## 2016-11-09 LAB — BASIC METABOLIC PANEL
Anion gap: 7 (ref 5–15)
BUN: 20 mg/dL (ref 6–20)
CO2: 26 mmol/L (ref 22–32)
Calcium: 7 mg/dL — ABNORMAL LOW (ref 8.9–10.3)
Chloride: 103 mmol/L (ref 101–111)
Creatinine, Ser: 0.86 mg/dL (ref 0.44–1.00)
GFR calc Af Amer: >60 mL/min (ref >60)
GFR calc non Af Amer: >60 mL/min (ref >60)
Glucose, Bld: 105 mg/dL — ABNORMAL HIGH (ref 65–99)
Potassium: 3.4 mmol/L — ABNORMAL LOW (ref 3.5–5.1)
Sodium: 136 mmol/L (ref 135–145)

## 2016-11-09 LAB — URINE CULTURE: Culture: NO GROWTH

## 2016-11-09 LAB — GLUCOSE, CAPILLARY
Glucose-Capillary: 105 mg/dL — ABNORMAL HIGH (ref 65–99)
Glucose-Capillary: 173 mg/dL — ABNORMAL HIGH (ref 65–99)
Glucose-Capillary: 148 mg/dL — ABNORMAL HIGH (ref 65–99)
Glucose-Capillary: 138 mg/dL — ABNORMAL HIGH (ref 65–99)

## 2016-11-09 MED ORDER — VITAMINS A & D EX OINT
TOPICAL_OINTMENT | CUTANEOUS | Status: AC
  Administered 2016-11-09: 1
  Filled 2016-11-09: qty 5

## 2016-11-09 NOTE — Progress Notes (Signed)
PROGRESS NOTE    [name]  ZOX:[id number] DOB: 06-29-1951 DOA: 11/08/2016 PCP: Sonda PrimesAlex Plotnikov, MD   Chief Complaint  Patient presents with  . Weakness  . Cough  . Fever  . Urinary Tract Infection    Brief Narrative:  HPI on 11/08/2016 by Dr. Latrelle DodrillEdwin Silva Rosana HoesZapata  [name] is a 66 y.o. female with medical history significant of CVA with residual left side weakness, mostly wheelchair bound, HTN and chronic back pain on Dilaudid. Presented to the ED after have generalize weakness for the past 2 days. Symptoms began on 11/06/16 when she was diagnosed with UTI treated with cipro by PCP. Despite taking antibiotics patient continue to decline. Patient report dysuria, abdominal pain and malaise. Per husband patients temperature were 102, and she was advised to come to the ED   Assessment & Plan   Severe sepsis secondary to E.coli urinary tract infection -upon admission, patient was tachycardic, febrile, with leukocytosis, lactic acidosis with AKI -Placed on zosyn, IVF -Urine culture prior to admission showed Ecoli (pansensitive). Patient failed outpatient treatment with cipro -Blood cultures pending -Sepsis morphology improving  Acute renal failure -Secondary to sepsis, dehydration -Creatinine upon admission 1.35, currently 0.86 -Continue to monitor BMP  Acute hypoxia -Upon admission, patient was noted to have a SPO2 of 88% -Likely secondary sepsis, continue supplemental oxygen -Chest x-ray showed minimal by basilar atelectasis  Essential hypertension -Metoprolol held -Upon admission, patient was mildly hypotensive which resolved with IV fluids -Continue to monitor closely   Diabetes mellitus, type II -Metformin held -Continue insulin sliding scale and CBG monitoring  History of CVA with residulal left-sided weakness -Continue aspirin  Chronic back pain -Continue home medications  Hypokalemia -Replacing to monitor BMP  DVT Prophylaxis  Heparin  Code Status:  Full  Family Communication: Husband at bedside  Disposition Plan: Admitted. Suspect discharge to home in 24-48 hours pending cultures   Consultants None  Procedures  None  Antibiotics   Anti-infectives    Start     Dose/Rate Route Frequency Ordered Stop   11/09/16 1200  vancomycin (VANCOCIN) IVPB 1000 mg/200 mL premix  Status:  Discontinued     1,000 mg 200 mL/hr over 60 Minutes Intravenous Every 24 hours 11/08/16 1431 11/08/16 1800   11/08/16 1800  piperacillin-tazobactam (ZOSYN) IVPB 3.375 g     3.375 g 12.5 mL/hr over 240 Minutes Intravenous Every 8 hours 11/08/16 1432     11/08/16 1245  piperacillin-tazobactam (ZOSYN) IVPB 3.375 g     3.375 g 100 mL/hr over 30 Minutes Intravenous  Once 11/08/16 1232 11/08/16 1330   11/08/16 1245  vancomycin (VANCOCIN) IVPB 1000 mg/200 mL premix     1,000 mg 200 mL/hr over 60 Minutes Intravenous  Once 11/08/16 1232 11/08/16 1409      Subjective:   [name] seen and examined today.  Patient states she is feeling better than yesterday. Denies chest pain, shortness of breath, dizziness, headache. Mild abdominal pain.     Objective:   Vitals:   11/09/16 0400 11/09/16 0800 11/09/16 0830 11/09/16 1130  BP: (!) 117/58 (!) 113/50  118/61  Pulse: 89 95 93 99  Resp: 20 14 (!) 21 17  Temp: 99.2 F (37.3 C) 99.8 F (37.7 C)    TempSrc:  Oral    SpO2: 100% 97% 98% 96%  Weight:      Height:        Intake/Output Summary (Last 24 hours) at 11/09/16 1230 Last data filed at 11/09/16 1000  Gross  per 24 hour  Intake             1730 ml  Output             1660 ml  Net               70 ml   Filed Weights   11/08/16 1657  Weight: 62.5 kg (137 lb 12.6 oz)    Exam  General: Well developed, well nourished, NAD, appears stated age  HEENT: NCAT, mucous membranes moist.   Cardiovascular: S1 S2 auscultated, RRR, no murmurs   Respiratory: Clear to auscultation bilaterally with equal chest rise  Abdomen: Soft, suprapubic tenderness,  nondistended, + bowel sounds  Extremities: warm dry without cyanosis clubbing or edema  Neuro: AAOx3, left sided weakness (old)  Psych: Normal affect and demeanor   Data Reviewed: I have personally reviewed following labs and imaging studies  CBC:  Recent Labs Lab 11/08/16 1215 11/08/16 1900  WBC 10.8* 9.5  NEUTROABS 9.1*  --   HGB 11.0* 10.5*  HCT 33.2* 31.5*  MCV 88.5 89.0  PLT 168 155   Basic Metabolic Panel:  Recent Labs Lab 11/08/16 1215 11/08/16 1900 11/09/16 0752  NA 128*  --  136  K 3.9  --  3.4*  CL 89*  --  103  CO2 27  --  26  GLUCOSE 302*  --  105*  BUN 40*  --  20  CREATININE 1.35* 0.94 0.86  CALCIUM 7.6*  --  7.0*   GFR: Estimated Creatinine Clearance: 57.9 mL/min (by C-G formula based on SCr of 0.86 mg/dL). Liver Function Tests:  Recent Labs Lab 11/08/16 1215  AST 34  ALT 7*  ALKPHOS 92  BILITOT 0.8  PROT 6.1*  ALBUMIN 3.1*   No results for input(s): LIPASE, AMYLASE in the last 168 hours. No results for input(s): AMMONIA in the last 168 hours. Coagulation Profile:  Recent Labs Lab 11/08/16 1900  INR 1.12   Cardiac Enzymes: No results for input(s): CKTOTAL, CKMB, CKMBINDEX, TROPONINI in the last 168 hours. BNP (last 3 results) No results for input(s): PROBNP in the last 8760 hours. HbA1C: No results for input(s): HGBA1C in the last 72 hours. CBG:  Recent Labs Lab 11/08/16 1948 11/09/16 0736  GLUCAP 117* 105*   Lipid Profile: No results for input(s): CHOL, HDL, LDLCALC, TRIG, CHOLHDL, LDLDIRECT in the last 72 hours. Thyroid Function Tests: No results for input(s): TSH, T4TOTAL, FREET4, T3FREE, THYROIDAB in the last 72 hours. Anemia Panel: No results for input(s): VITAMINB12, FOLATE, FERRITIN, TIBC, IRON, RETICCTPCT in the last 72 hours. Urine analysis:    Component Value Date/Time   COLORURINE AMBER (A) 11/08/2016 1215   APPEARANCEUR CLOUDY (A) 11/08/2016 1215   LABSPEC 1.014 11/08/2016 1215   PHURINE 5.0 11/08/2016  1215   GLUCOSEU NEGATIVE 11/08/2016 1215   GLUCOSEU NEGATIVE 12/24/2012 1626   HGBUR SMALL (A) 11/08/2016 1215   BILIRUBINUR NEGATIVE 11/08/2016 1215   BILIRUBINUR Neg 11/06/2016 1159   KETONESUR NEGATIVE 11/08/2016 1215   PROTEINUR 100 (A) 11/08/2016 1215   UROBILINOGEN negative 11/06/2016 1159   UROBILINOGEN 0.2 12/24/2012 1626   NITRITE POSITIVE (A) 11/08/2016 1215   LEUKOCYTESUR SMALL (A) 11/08/2016 1215   Sepsis Labs: @LABRCNTIP (procalcitonin:4,lacticidven:4)  ) Recent Results (from the past 240 hour(s))  Urine culture     Status: None   Collection Time: 11/06/16 12:08 PM  Result Value Ref Range Status   Culture ESCHERICHIA COLI  Final   Colony Count Greater than 100,000  CFU/mL  Final   Organism ID, Bacteria ESCHERICHIA COLI  Final      Susceptibility   Escherichia coli -  (no method available)    AMPICILLIN 4 Sensitive     AMOX/CLAVULANIC <=2 Sensitive     AMPICILLIN/SULBACTAM <=2 Sensitive     PIP/TAZO <=4 Sensitive     IMIPENEM <=0.25 Sensitive     CEFAZOLIN <=4 Not Reportable     CEFTRIAXONE <=1 Sensitive     CEFTAZIDIME <=1 Sensitive     CEFEPIME <=1 Sensitive     GENTAMICIN <=1 Sensitive     TOBRAMYCIN <=1 Sensitive     CIPROFLOXACIN <=0.25 Sensitive     LEVOFLOXACIN <=0.12 Sensitive     NITROFURANTOIN <=16 Sensitive     TRIMETH/SULFA* <=20 Sensitive      * NR=NOT REPORTABLE,SEE COMMENTORAL therapy:A cefazolin MIC of <32 predicts susceptibility to the oral agents cefaclor,cefdinir,cefpodoxime,cefprozil,cefuroxime,cephalexin,and loracarbef when used for therapy of uncomplicated UTIs due to E.coli,K.pneumomiae,and P.mirabilis. PARENTERAL therapy: A cefazolinMIC of >8 indicates resistance to parenteralcefazolin. An alternate test method must beperformed to confirm susceptibility to parenteralcefazolin.  Blood Culture (routine x 2)     Status: None (Preliminary result)   Collection Time: 11/08/16 12:53 PM  Result Value Ref Range Status   Specimen Description    Final    BLOOD RIGHT ANTECUBITAL Performed at [facility], [address], [place], [place] [address]    Special Requests BOTTLES DRAWN AEROBIC AND ANAEROBIC 5CC  Final   Culture PENDING  Incomplete   Report Status PENDING  Incomplete  Urine culture     Status: None   Collection Time: 11/08/16 12:53 PM  Result Value Ref Range Status   Specimen Description URINE, RANDOM  Final   Special Requests NONE  Final   Culture   Final    NO GROWTH Performed at [facility], [address], [place], [place] [address]    Report Status 11/09/2016 FINAL  Final      Radiology Studies: Dg Chest 2 View  Result Date: 11/08/2016 CLINICAL DATA:  Fever for 3 days EXAM: CHEST  2 VIEW COMPARISON:  08/01/2016 FINDINGS: Minimal bibasilar atelectasis. Heart is normal size. No effusions. No acute bony abnormality appear IMPRESSION: Minimal bibasilar atelectasis. Electronically Signed   By: Charlett Nose M.D.   On: 11/08/2016 12:37     Scheduled Meds: . aspirin EC  81 mg Oral Daily  . carbidopa-levodopa  1 tablet Oral TID  . chlorhexidine  5 mL Mouth/Throat BID  . heparin  5,000 Units Subcutaneous Q8H  . insulin aspart  0-15 Units Subcutaneous TID WC  . LORazepam  1 mg Oral BID  . LORazepam  2 mg Oral QHS  . mirtazapine  15 mg Oral QHS  . piperacillin-tazobactam (ZOSYN)  IV  3.375 g Intravenous Q8H   Continuous Infusions: . sodium chloride 100 mL/hr at 11/09/16 1145     LOS: 1 day   Time Spent in minutes   30 minutes  Domenique Quest D.O. on 11/09/2016 at 12:30 PM  Between 7am to 7pm - Pager - [phone]  After 7pm go to [email or web address] - password [email or web address]  And look for the night coverage person covering for me after hours  [facility] Office  [phone]

## 2016-11-09 NOTE — Progress Notes (Signed)
[facility]  Patient Status: Active  [facility] is providing the following services: PT  If patient discharges after hours, please call [phone].   Almedia BallsKimberly Gurley 11/09/2016, 9:49 AM

## 2016-11-09 NOTE — Care Management Note (Signed)
Case Management Note  Patient Details  Name: Claretta Fraiseanci D Delano MRN: [id number] Date of Birth: 1951-09-21  Subjective/Objective:       sepsis             Action/Plan: Date:  November 09, 2016 Chart reviewed for concurrent status and case management needs. Will continue to follow patient progress. Discharge Planning: following for needs Expected discharge date: [id number] Marcelle SmilingRhonda Davis, BSN, [place] LakeRN3, [place]   [phone]  Expected Discharge Date:   (unknown)               Expected Discharge Plan:  Home/Self Care  In-House Referral:     Discharge planning Services     Post Acute Care Choice:    Choice offered to:     DME Arranged:    DME Agency:     HH Arranged:    HH Agency:     Status of Service:  In process, will continue to follow  If discussed at Long Length of Stay Meetings, dates discussed:    Additional Comments:  Golda AcreDavis, Rhonda Lynn, RN 11/09/2016, 10:14 AM

## 2016-11-09 NOTE — Progress Notes (Signed)
PT demonstrated verbal and hands on understanding of Flutter device. 

## 2016-11-10 DIAGNOSIS — E1100 Type 2 diabetes mellitus with hyperosmolarity without nonketotic hyperglycemic-hyperosmolar coma (NKHHC): Secondary | ICD-10-CM

## 2016-11-10 DIAGNOSIS — N390 Urinary tract infection, site not specified: Secondary | ICD-10-CM

## 2016-11-10 DIAGNOSIS — R338 Other retention of urine: Secondary | ICD-10-CM

## 2016-11-10 LAB — BASIC METABOLIC PANEL
Anion gap: 8 (ref 5–15)
BUN: 11 mg/dL (ref 6–20)
CO2: 24 mmol/L (ref 22–32)
Calcium: 7.2 mg/dL — ABNORMAL LOW (ref 8.9–10.3)
Chloride: 106 mmol/L (ref 101–111)
Creatinine, Ser: 0.65 mg/dL (ref 0.44–1.00)
GFR calc Af Amer: >60 mL/min (ref >60)
GFR calc non Af Amer: >60 mL/min (ref >60)
Glucose, Bld: 160 mg/dL — ABNORMAL HIGH (ref 65–99)
Potassium: 3.4 mmol/L — ABNORMAL LOW (ref 3.5–5.1)
Sodium: 138 mmol/L (ref 135–145)

## 2016-11-10 LAB — CBC
HCT: 32.7 % — ABNORMAL LOW (ref 36.0–46.0)
Hemoglobin: 10.7 g/dL — ABNORMAL LOW (ref 12.0–15.0)
MCH: 29.2 pg (ref 26.0–34.0)
MCHC: 32.7 g/dL (ref 30.0–36.0)
MCV: 89.1 fL (ref 78.0–100.0)
Platelets: 147 10*3/uL — ABNORMAL LOW (ref 150–400)
RBC: 3.67 MIL/uL — ABNORMAL LOW (ref 3.87–5.11)
RDW: 15.5 % (ref 11.5–15.5)
WBC: 7.7 10*3/uL (ref 4.0–10.5)

## 2016-11-10 LAB — GLUCOSE, CAPILLARY
Glucose-Capillary: 158 mg/dL — ABNORMAL HIGH (ref 65–99)
Glucose-Capillary: 153 mg/dL — ABNORMAL HIGH (ref 65–99)

## 2016-11-10 MED ORDER — TAMSULOSIN HCL 0.4 MG PO CAPS
0.4000 mg | ORAL_CAPSULE | Freq: Every day | ORAL | Status: DC
  Administered 2016-11-10: 0.4 mg via ORAL
  Filled 2016-11-10: qty 1

## 2016-11-10 MED ORDER — POTASSIUM CHLORIDE CRYS ER 20 MEQ PO TBCR
40.0000 meq | EXTENDED_RELEASE_TABLET | Freq: Once | ORAL | Status: AC
  Administered 2016-11-10: 40 meq via ORAL
  Filled 2016-11-10: qty 2

## 2016-11-10 MED ORDER — TAMSULOSIN HCL 0.4 MG PO CAPS: 0.4000 mg | ORAL_CAPSULE | Freq: Every day | ORAL | 0 refills | Status: DC

## 2016-11-10 MED ORDER — AMOXICILLIN 500 MG PO CAPS: 500.0000 mg | ORAL_CAPSULE | Freq: Two times a day (BID) | ORAL | 0 refills | Status: DC

## 2016-11-10 MED ORDER — AMOXICILLIN 500 MG PO CAPS
500.0000 mg | ORAL_CAPSULE | Freq: Two times a day (BID) | ORAL | Status: DC
  Administered 2016-11-10: 500 mg via ORAL
  Filled 2016-11-10 (×2): qty 1

## 2016-11-10 NOTE — Discharge Summary (Addendum)
Physician Discharge Summary  Claretta Fraiseanci D Moisan ZOX:[id number] DOB: 1951/01/06 DOA: 11/08/2016  PCP: Sonda PrimesAlex Plotnikov, MD  Admit date: 11/08/2016 Discharge date: 11/10/2016  Time spent: 485 minutes  Recommendations for Outpatient Follow-up:  Patient will be discharged to home with continued home health physical therapy.  Patient will need to follow up with primary care provider within one week of discharge.  Follow up with urology for urinary retention.  Patient should continue medications as prescribed.  Patient should follow a heart healthy/carb modified diet.   Discharge Diagnoses:  Severe sepsis secondary to E.coli urinary tract infection Acute renal failure Acute hypoxia Essential hypertension Diabetes mellitus, type II History of CVA with residulal left-sided weakness Chronic back pain Hypokalemia  Discharge Condition: Stable  Diet recommendation: heart healthy/carb modified diet  Filed Weights   11/08/16 1657  Weight: 62.5 kg (137 lb 12.6 oz)    History of present illness:  on 11/08/2016 by Dr. Latrelle DodrillEdwin Silva Gerlene BurdockZapata  Tyeshia D Gunnis a 66 y.o.femalewith medical history significant of CVA with residual left side weakness, mostly wheelchair bound, HTN and chronic back pain on Dilaudid. Presented to the ED after have generalize weakness for the past 2 days. Symptoms began on 11/06/16 when she was diagnosed with UTI treated with cipro by PCP. Despite taking antibiotics patient continue to decline. Patient report dysuria, abdominal pain and malaise. Per husband patients temperature were 102, and she was advised to come to the ED   Hospital Course:  Severe sepsis secondary to E.coli urinary tract infection -upon admission, patient was tachycardic, febrile, with leukocytosis, lactic acidosis with AKI -Placed on zosyn, IVF -Urine culture prior to admission showed Ecoli (pansensitive). Patient failed outpatient treatment with cipro -Blood cultures show no growth to date -Urine culture on  admission showed no growth -Sepsis morphology improving -Will discharge with amoxicillin  Acute renal failure -Secondary to sepsis, dehydration -Creatinine upon admission 1.35, currently 0.65 -Continue to monitor BMP  Acute urinary retention -Likely secondary to the above -Patient had foley catheter place -Removed, attempted voiding trial.  Patient failed. Bladder scan showed 523 cc. -Started on flomax -Follow up with urology  Acute hypoxia -Upon admission, patient was noted to have a SPO2 of 88% -Likely secondary sepsis -Weaned off of oxygen -Currently O2 sats are 94% on room air -Chest x-ray showed minimal by basilar atelectasis  Essential hypertension -Metoprolol held -Upon admission, patient was mildly hypotensive which resolved with IV fluids -Continue to monitor closely   Diabetes mellitus, type II -Metformin held, resume upon discharge  History of CVA with residulal left-sided weakness -Continue aspirin  Chronic back pain -Continue home medications  Hypokalemia -Replacing to monitor BMP  Consultants None  Procedures  None  Discharge Exam: Vitals:   11/09/16 2049 11/10/16 0433  BP: 128/68 131/83  Pulse: 95 (!) 101  Resp: 18 20  Temp: 99.3 F (37.4 C) 99 F (37.2 C)   Patient states she is feeling much better and feels her abdominal pain has improved. Denies chest pain, shortness of breath, dizziness, headache.    Exam  General: Well developed, well nourished, NAD, appears stated age  HEENT: NCAT, mucous membranes moist.   Cardiovascular: S1 S2 auscultated, RRR, no murmurs   Respiratory: Clear to auscultation bilaterally with equal chest rise  Abdomen: Soft, mild suprapubic tenderness, nondistended, + bowel sounds  Extremities: warm dry without cyanosis clubbing or edema  Neuro: AAOx3, left sided weakness (old)  Psych: Appropriate and pleasant  Discharge Instructions Discharge Instructions    Discharge instructions  Complete  by:  As directed    Patient will be discharged to home with continued home health physical therapy.  Patient will need to follow up with primary care provider within one week of discharge.  Patient should continue medications as prescribed.  Patient should follow a heart healthy/carb modified diet.     Current Discharge Medication List    START taking these medications   Details  amoxicillin (AMOXIL) 500 MG capsule Take 1 capsule (500 mg total) by mouth every 12 (twelve) hours. Qty: 10 capsule, Refills: 0      CONTINUE these medications which have NOT CHANGED   Details  albuterol (PROVENTIL HFA;VENTOLIN HFA) 108 (90 Base) MCG/ACT inhaler Inhale 1-2 puffs into the lungs every 6 (six) hours as needed for wheezing or shortness of breath. Qty: 1 Inhaler, Refills: 3   Associated Diagnoses: COPD exacerbation (HCC)    aspirin 81 MG EC tablet Take 81 mg by mouth daily.      b complex vitamins tablet Take 1 tablet by mouth daily. Qty: 100 tablet, Refills: 3    calcium carbonate (OSCAL) 1500 (600 Ca) MG TABS tablet Take 1 tablet (1,500 mg total) by mouth 2 (two) times daily with a meal. Qty: 60 tablet, Refills: 4   Associated Diagnoses: Osteoporosis without current pathological fracture, unspecified osteoporosis type    carbidopa-levodopa (SINEMET IR) 25-250 MG tablet TAKE 1 TABLET BY MOUTH 3 (THREE) TIMES DAILY. Qty: 270 tablet, Refills: 3    chlorhexidine (PERIDEX) 0.12 % solution RINSE WITH 1/2 OUNCE TWICE DAILY FOR 30 SECONDS AND SPIT OUT AFTER BREAKFAST AND BEFORE BED Refills: 0    cholecalciferol (VITAMIN D) 1000 UNITS tablet Take 2 tablets (2,000 Units total) by mouth daily. Qty: 100 tablet, Refills: 3    glucose blood (ONETOUCH VERIO) test strip Use to check blood sugars twice a day. Dx E11.9 Qty: 100 each, Refills: 5    HYDROmorphone (DILAUDID) 2 MG tablet Take 2-3 tablets (4-6 mg total) by mouth every 6 (six) hours as needed for moderate pain or severe pain. Qty: 75 tablet,  Refills: 0    LORazepam (ATIVAN) 2 MG tablet Take 0.5-1 tablets (1-2 mg total) by mouth 3 (three) times daily. Qty: 90 tablet, Refills: 3    metFORMIN (GLUCOPHAGE-XR) 500 MG 24 hr tablet TAKE 1 TABLET THREE TIMES A DAY Qty: 270 tablet, Refills: 1    metoprolol tartrate (LOPRESSOR) 25 MG tablet TAKE ONE-HALF TABLET (12.5 MG) BY MOUTH TWO TIMES DAILY Qty: 90 tablet, Refills: 2    mirtazapine (REMERON) 15 MG tablet TAKE 1 TABLET BY MOUTH AT BEDTIME AT 6-8 PM Qty: 90 tablet, Refills: 3    ONETOUCH DELICA LANCETS 33G MISC Use to help check blood sugars twice a day. Dx E11.9 Qty: 100 each, Refills: 3    phenazopyridine (PYRIDIUM) 200 MG tablet Take 1 tablet (200 mg total) by mouth 3 (three) times daily as needed for pain. Qty: 10 tablet, Refills: 0   Associated Diagnoses: Acute cystitis with hematuria    promethazine (PHENERGAN) 12.5 MG tablet Take 1 tablet (12.5 mg total) by mouth every 6 (six) hours as needed. for nausea Qty: 60 tablet, Refills: 0   Associated Diagnoses: Nausea and vomiting, intractability of vomiting not specified, unspecified vomiting type    repaglinide (PRANDIN) 1 MG tablet Take 1 tablet (1 mg total) by mouth 3 (three) times daily before meals. Qty: 90 tablet, Refills: 3   Associated Diagnoses: Type 2 diabetes mellitus with hyperosmolarity without coma, without long-term current use  of insulin (HCC)    tolterodine (DETROL LA) 4 MG 24 hr capsule TAKE ONE CAPSULE AT BEDTIME Qty: 90 capsule, Refills: 3    umeclidinium-vilanterol (ANORO ELLIPTA) 62.5-25 MCG/INH AEPB Inhale 1 puff into the lungs daily. Qty: 1 each, Refills: 3    Vitamin D, Ergocalciferol, (DRISDOL) 50000 units CAPS capsule TAKE 1 CAPSULE (50,000 UNITS TOTAL) BY MOUTH ONCE A WEEK. Qty: 6 capsule, Refills: 0    zolpidem (AMBIEN) 10 MG tablet Take 1 tablet (10 mg total) by mouth at bedtime as needed. for sleep Qty: 30 tablet, Refills: 2      STOP taking these medications     ciprofloxacin (CIPRO)  500 MG tablet      zoster vaccine live, PF, (ZOSTAVAX) [address] UNT/0.65ML injection        Allergies  Allergen Reactions  . Asa [Aspirin] Other (See Comments)    "stomach burning", "I can take baby aspirin"   Follow-up Information    [name], MD. Schedule an appointment as soon as possible for a visit in 1 week(s).   Specialty:  Internal Medicine Why:  Hospital follow up Contact information: [address] [place] [address] [phone]        [facility]-Home Health Follow up.   Why:  Home Health Physical Therapy Contact information: [address] [place] [address] [phone]            The results of significant diagnostics from this hospitalization (including imaging, microbiology, ancillary and laboratory) are listed below for reference.    Significant Diagnostic Studies: Dg Chest 2 View  Result Date: 11/08/2016 CLINICAL DATA:  Fever for 3 days EXAM: CHEST  2 VIEW COMPARISON:  08/01/2016 FINDINGS: Minimal bibasilar atelectasis. Heart is normal size. No effusions. No acute bony abnormality appear IMPRESSION: Minimal bibasilar atelectasis. Electronically Signed   By: Charlett Nose M.D.   On: 11/08/2016 12:37    Microbiology: Recent Results (from the past 240 hour(s))  Urine culture     Status: None   Collection Time: 11/06/16 12:08 PM  Result Value Ref Range Status   Culture ESCHERICHIA COLI  Final   Colony Count Greater than 100,000 CFU/mL  Final   Organism ID, Bacteria ESCHERICHIA COLI  Final      Susceptibility   Escherichia coli -  (no method available)    AMPICILLIN 4 Sensitive     AMOX/CLAVULANIC <=2 Sensitive     AMPICILLIN/SULBACTAM <=2 Sensitive     PIP/TAZO <=4 Sensitive     IMIPENEM <=0.25 Sensitive     CEFAZOLIN <=4 Not Reportable     CEFTRIAXONE <=1 Sensitive     CEFTAZIDIME <=1 Sensitive     CEFEPIME <=1 Sensitive     GENTAMICIN <=1 Sensitive     TOBRAMYCIN <=1 Sensitive     CIPROFLOXACIN <=0.25 Sensitive      LEVOFLOXACIN <=0.12 Sensitive     NITROFURANTOIN <=16 Sensitive     TRIMETH/SULFA* <=20 Sensitive      * NR=NOT REPORTABLE,SEE COMMENTORAL therapy:A cefazolin MIC of <32 predicts susceptibility to the oral agents cefaclor,cefdinir,cefpodoxime,cefprozil,cefuroxime,cephalexin,and loracarbef when used for therapy of uncomplicated UTIs due to E.coli,K.pneumomiae,and P.mirabilis. PARENTERAL therapy: A cefazolinMIC of >8 indicates resistance to parenteralcefazolin. An alternate test method must beperformed to confirm susceptibility to parenteralcefazolin.  Blood Culture (routine x 2)     Status: None (Preliminary result)   Collection Time: 11/08/16 12:53 PM  Result Value Ref Range Status   Specimen Description BLOOD RIGHT ANTECUBITAL  Final   Special Requests  BOTTLES DRAWN AEROBIC AND ANAEROBIC 5CC  Final   Culture   Final    NO GROWTH 2 DAYS Performed at [facility], [address], [place], [place] [address]    Report Status PENDING  Incomplete  Blood Culture (routine x 2)     Status: None (Preliminary result)   Collection Time: 11/08/16 12:53 PM  Result Value Ref Range Status   Specimen Description BLOOD LEFT ARM  Final   Special Requests BOTTLES DRAWN AEROBIC AND ANAEROBIC 5CC  Final   Culture   Final    NO GROWTH 2 DAYS Performed at [facility], [address], [place], [place] [address]    Report Status PENDING  Incomplete  Urine culture     Status: None   Collection Time: 11/08/16 12:53 PM  Result Value Ref Range Status   Specimen Description URINE, RANDOM  Final   Special Requests NONE  Final   Culture   Final    NO GROWTH Performed at [facility], [address] [place], [place] [address]    Report Status 11/09/2016 FINAL  Final     Labs: Basic Metabolic Panel:  Recent Labs Lab 11/08/16 1215 11/08/16 1900 11/09/16 0752 11/10/16 0554  NA 128*  --  136 138  K 3.9  --  3.4* 3.4*  CL 89*  --  103 106  CO2 27  --  26 24  GLUCOSE 302*  --  105* 160*   BUN 40*  --  20 11  CREATININE 1.35* 0.94 0.86 0.65  CALCIUM 7.6*  --  7.0* 7.2*   Liver Function Tests:  Recent Labs Lab 11/08/16 1215  AST 34  ALT 7*  ALKPHOS 92  BILITOT 0.8  PROT 6.1*  ALBUMIN 3.1*   No results for input(s): LIPASE, AMYLASE in the last 168 hours. No results for input(s): AMMONIA in the last 168 hours. CBC:  Recent Labs Lab 11/08/16 1215 11/08/16 1900 11/10/16 0554  WBC 10.8* 9.5 7.7  NEUTROABS 9.1*  --   --   HGB 11.0* 10.5* 10.7*  HCT 33.2* 31.5* 32.7*  MCV 88.5 89.0 89.1  PLT 168 155 147*   Cardiac Enzymes: No results for input(s): CKTOTAL, CKMB, CKMBINDEX, TROPONINI in the last 168 hours. BNP: BNP (last 3 results)  Recent Labs  07/18/16 1327  BNP 333.6*    ProBNP (last 3 results) No results for input(s): PROBNP in the last 8760 hours.  CBG:  Recent Labs Lab 11/09/16 0736 11/09/16 1249 11/09/16 1708 11/09/16 2053 11/10/16 0742  GLUCAP 105* 173* 148* 138* 158*       Signed:  Carynn Felling  [facility] 11/10/2016, 11:58 AM

## 2016-11-10 NOTE — Progress Notes (Signed)
Pt leaving at this time with her spouse. Alert and oriented. Discharge instructions given/explained with Foley catheter care and instructions given/explained. Foley catheter intact and draining well. Followup appointments noted. Spouse stated he would call for appointment with urology on Monday Spouse aware to pickup prescriptions at said pharmacy.

## 2016-11-10 NOTE — Care Management Important Message (Signed)
Important Message  Patient Details  Name: [name] MRN: [id number] Date of Birth: 1951/05/04   Medicare Important Message Given:  Yes (signed copy/sent for scan)    Elliot CousinShavis, Shilo Pauwels Ellen, RN 11/10/2016, 11:53 AM

## 2016-11-10 NOTE — Care Management Note (Addendum)
Case Management Note  Patient Details  Name: [name] MRN: [id number] Date of Birth: 05/09/1951  Subjective/Objective:  Sepsis d/t UTI                   Action/Plan: Discharge Planning: AVS reviewed: NCM spoke to pt and husband at bedside. Pt states she has RW, wheelchair, and 3n1 bedside commode at home. Pt active with [facility] for HHPT. Contacted [facility] Liaison  to make aware of dc home today.     PCP Tresa GarterPLOTNIKOV, ALEKSEI V MD   Expected Discharge Date:  11/10/16               Expected Discharge Plan:  Home w Home Health Services  In-House Referral:  NA  Discharge planning Services  CM Consult  Post Acute Care Choice:  Home Health Choice offered to:  Patient  DME Arranged:  N/A DME Agency:  NA  HH Arranged:  PT HH Agency:  [facility]  Status of Service:  Completed, signed off  If discussed at Long Length of Stay Meetings, dates discussed:    Additional Comments:  Elliot CousinShavis, Yarden Hillis Ellen, RN 11/10/2016, 11:46 AM

## 2016-11-10 NOTE — Progress Notes (Signed)
Foley catheter d/c at 0900 this morning. Pt has not voided. 4 hour bladder scan yielded 286 and 7 hour bladder scan yielded 523. Foley catheter replaced with 14 Fr for urinary retention and 700cc clear amber urine returned. Pt left hospital with Foley catheter intact.

## 2016-11-10 NOTE — Discharge Instructions (Signed)
Urosepsis Urosepsis is an infection that has spread to the blood (sepsis) from the urinary tract. The urinary tract includes the kidneys, the tubes that connect the kidneys to the bladder (ureters), the bladder, and the tube that passes urine out of the body (urethra). These organs make, store, and pass urine from the body. Urosepsis is a severe illness that can be life-threatening if it is not treated immediately. What are the causes? Causes of this condition include:  A urinary tract infection (UTI) that spreads to your blood.  A urinary tract blockage (obstruction) from kidney stones.  Having a long, thin tube (catheter) that stays in place to drain urine (indwelling urinary catheter).  Prostate enlargement (prostatitis) or prostate infection (abscess). What increases the risk? This condition is more likely to develop in people who:  Are female.  Are 65 years of age or older.  Have a long-term (chronic) disease, such as diabetes.  Have a weak body defense system (immune system).  Have a condition that lessens or changes the urine flow, such as a kidney or bladder stone, prostate disease, or a tumor of the urinary tract.  Have had surgery of the urinary tract or use a drainage tube for urine (urinary catheter).  Have lost feeling below the waist or are in a wheelchair. What are the signs or symptoms? Early symptoms of this condition are similar to the symptoms of a severe UTI. These may include:  Pain in your side, back, or lower abdomen.  Chills and a fever.  Nausea and vomiting.  Frequent need to pass urine.  Burning pain when passing urine.  Bloody or cloudy urine.  Bad-smelling urine.  Fatigue. Once the infection has spread to the blood and a sepsis reaction starts, additional symptoms may include:  High fever.  Chills with shaking.  Fast breathing.  Fast heartbeat.  Cold and clammy skin.  Anxiety.  Confusion.  Severe pain in the  abdomen.  Trouble breathing.  Trouble passing urine or not being able to pass urine.  Fainting. How is this diagnosed? This condition is diagnosed based on your symptoms, medical history, and a physical exam. You may also have tests, including:  Urine tests to check kidney function and look for infection.  Blood tests to check body functions and look for infection.  Imaging studies to look for blocked flow of urine in the urinary tract. How is this treated? This condition is a medical emergency that is treated at the hospital. Treatment may include:  Receiving one or more of the following through an IV tube:  Fluids.  Antibiotic medicines.  Medicines to support blood pressure.  Steroids to reduce the inflammatory response.  Oxygen and breathing support.  Removing a urinary catheter that may be a source of infection, if this applies.  Use of a machine (dialysis) to filter the blood.  Surgery to drain infected areas or restore urine flow. This is rare. Follow these instructions at home:  Take over-the-counter and prescription medicines only as told by your health care provider.  Take your antibiotic medicine as told by your health care provider. Do not stop taking the antibiotic even if you start to feel better.  Drink enough fluid to keep your urine clear or pale yellow.  Return to your normal activities as told by your health care provider. Ask your health care provider what activities are safe for you.  Keep all follow-up visits as told by your health care provider. This is important. Contact a health care provider   if:  Your symptoms do not improve or they get worse.  You have new symptoms of a UTI. Get help right away if:  You have new or continued symptoms of sepsis after hospitalization, such as:  High fever.  Chills.  Difficulty breathing.  Confusion.  Nausea and vomiting. These symptoms may represent a serious problem that is an emergency. Do not  wait to see if the symptoms will go away. Get medical help right away. Call your local emergency services (911 in the [place] Do not drive yourself to the hospital.  This information is not intended to replace advice given to you by your health care provider. Make sure you discuss any questions you have with your health care provider. Document Released: 09/17/2005 Document Revised: 05/15/2016 Document Reviewed: 03/12/2016 Elsevier Interactive Patient Education  2017 [employer].  

## 2016-11-12 ENCOUNTER — Telehealth: Payer: Self-pay | Admitting: *Deleted

## 2016-11-12 DIAGNOSIS — G40909 Epilepsy, unspecified, not intractable, without status epilepticus: Secondary | ICD-10-CM | POA: Diagnosis not present

## 2016-11-12 DIAGNOSIS — J029 Acute pharyngitis, unspecified: Secondary | ICD-10-CM | POA: Diagnosis not present

## 2016-11-12 DIAGNOSIS — E1165 Type 2 diabetes mellitus with hyperglycemia: Secondary | ICD-10-CM | POA: Diagnosis not present

## 2016-11-12 DIAGNOSIS — I69354 Hemiplegia and hemiparesis following cerebral infarction affecting left non-dominant side: Secondary | ICD-10-CM | POA: Diagnosis not present

## 2016-11-12 DIAGNOSIS — J189 Pneumonia, unspecified organism: Secondary | ICD-10-CM | POA: Diagnosis not present

## 2016-11-12 DIAGNOSIS — J440 Chronic obstructive pulmonary disease with acute lower respiratory infection: Secondary | ICD-10-CM | POA: Diagnosis not present

## 2016-11-12 NOTE — Telephone Encounter (Signed)
Transition Care Management Follow-up Telephone Call   Date discharged? 11/10/16    How have you been since you were released from the hospital? Called pt she states she is doing alright   Do you understand why you were in the hospital? YES   Do you understand the discharge instructions? YES   Where were you discharged to? Home   Items Reviewed:  Medications reviewed: YES  Allergies reviewed: YES  Dietary changes reviewed: NO  Referrals reviewed: No referral needed   Functional Questionnaire:   Activities of Daily Living (ADLs):   She states she are independent in the following: bathing and hygiene, feeding, continence, grooming, toileting and dressing States she require assistance with the following: ambulation   Any transportation issues/concerns?: NO   Any patient concerns? NO   Confirmed importance and date/time of follow-up visits scheduled YES 11/15/16,  pt had already called to make hosp follow-up this. Wanting to know why Dr. Macario GoldsPlot could not see her. Inform md did not have any availability. Advise pt to keep appt   Provider Appointment booked with Marcos EkeGreg Calone, NP  Confirmed with patient if condition begins to worsen call PCP or go to the ER.  Patient was given the office number and encouraged to call back with question or concerns.  : YES

## 2016-11-13 DIAGNOSIS — R338 Other retention of urine: Secondary | ICD-10-CM | POA: Diagnosis not present

## 2016-11-13 LAB — CULTURE, BLOOD (ROUTINE X 2)
Culture: NO GROWTH
Culture: NO GROWTH

## 2016-11-13 NOTE — ED Provider Notes (Signed)
[facility]-EMER[facility]ENCY DEPT Provider Note   CSN: [id number] Arrival date & time: 11/08/16  1150     History   Chief Complaint Chief Complaint  Patient presents with  . Weakness  . Cough  . Fever  . Urinary Tract Infection    HPI [name] is a 66 y.o. female.  HPI Patient presents with generalized weakness, subjective fevers and chills. She's also had dysuria. Recently diagnosed with UTI and started on Cipro. Also endorses cough. Denies any shortness of breath or chest pain. Past Medical History:  Diagnosis Date  . Anoxic brain injury (HCC)   . Anxiety   . Chronic neck pain   . Chronic pain in shoulder   . COPD (chronic obstructive pulmonary disease) (HCC)   . Depression   . Fracture of left humerus 06/24/2014  . Gait disorder   . GERD (gastroesophageal reflux disease)   . Hernia of abdominal cavity   . 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 Hyperlipidemia   . LBP (low back pain)   . Neuropathy (HCC)   . Osteoarthritis   . Seizure disorder (HCC)   . Seizures (HCC)   . Type II or unspecified type diabetes mellitus without mention of complication, not stated as uncontrolled 2010  . Vitamin B 12 deficiency 2011    Patient Active Problem List   Diagnosis Date Noted  . Urinary tract infection without hematuria   . Severe sepsis (HCC) 11/08/2016  . Leukocytosis 07/31/2016  . Community acquired pneumonia   . Hyperglycemia   . Acute respiratory failure with hypoxia (HCC)   . Acute urinary retention   . Elevated troponin   . Sepsis (HCC) 07/18/2016  . Lobar pneumonia, unspecified organism (HCC) 07/18/2016  . Pharyngitis 07/18/2016  . Elevated troponin I level 07/18/2016  . Oral thrush 07/18/2016  . Odynophagia 07/18/2016  . Anoxic brain injury (HCC) 12/29/2015  . Gait disorder 12/28/2015  . Well adult exam 12/08/2014  . Fracture of left humerus 06/24/2014  . Humerus fracture 06/24/2014  . Chest wall contusion 05/03/2014  . Acute  bronchitis 11/10/2013  . Left elbow pain 11/03/2013  . Right facial pain 06/19/2013  . Ankle ulcer (HCC) 04/02/2012  . Nausea & vomiting 10/05/2011  . Decubitus ulcer 06/26/2011  . Decubitus ulcer of coccyx 05/21/2011  . Cellulitis and abscess of other specified site 10/11/2010  . B12 deficiency 04/04/2010  . HYPERLIPIDEMIA 04/04/2010  . Anxiety state 11/14/2009  . Vitamin D deficiency 10/21/2009  . MELENA 10/21/2009  . DM2 (diabetes mellitus, type 2) (HCC) 07/07/2009  . FEVER, RECURRENT 06/21/2009  . COPD exacerbation (HCC) 02/25/2009  . OSTEOARTHRITIS 02/25/2009  . LOW BACK PAIN 02/25/2009  . TOBACCO USER 01/13/2009  . Rash and other nonspecific skin eruption 01/13/2009  . FURUNCLE 06/08/2008  . DEPRESSION 02/26/2007  . PAIN, CHRONIC NEC 02/26/2007  . Venous (peripheral) insufficiency 02/26/2007  . GERD 02/26/2007  . HERNIA, INCISIONAL 02/26/2007  . SHOULDER PAIN, LEFT 02/26/2007  . 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 INSOMNIA 02/26/2007  . Abnormality of gait 02/26/2007  . LEG EDEMA, CHRONIC 02/26/2007    Past Surgical History:  Procedure Laterality Date  . COLONOSCOPY    . DIAGNOSTIC LAPAROSCOPY     PMH: Exploratory lap  . HERNIA REPAIR     incisional  . ORIF HUMERUS FRACTURE Left 06/24/2014   Procedure: LEFT OPEN REDUCTION INTERNAL FIXATION (ORIF) PROXIMAL HUMERUS FRACTURE;  Surgeon: Eulas Post, MD;  Location: [facility] OR;  Service: Orthopedics;  Laterality: Left;  . PANCREATECTOMY    . TONSILLECTOMY    . TUBAL LIGATION      OB History    No data available       Home Medications    Prior to Admission medications   Medication Sig Start Date End Date Taking? Authorizing Provider  albuterol (PROVENTIL HFA;VENTOLIN HFA) 108 (90 Base) MCG/ACT inhaler Inhale 1-2 puffs into the lungs every 6 (six) hours as needed for wheezing or shortness of breath. 08/02/16  Yes Bonna Gains Nche, NP  aspirin 81 MG EC tablet Take 81 mg by  mouth daily.     Yes Historical Provider, MD  b complex vitamins tablet Take 1 tablet by mouth daily. 10/03/16  Yes Aleksei Plotnikov V, MD  calcium carbonate (OSCAL) 1500 (600 Ca) MG TABS tablet Take 1 tablet (1,500 mg total) by mouth 2 (two) times daily with a meal. 07/31/16  Yes Bonna Gains Nche, NP  carbidopa-levodopa (SINEMET IR) 25-250 MG tablet TAKE 1 TABLET BY MOUTH 3 (THREE) TIMES DAILY. 08/27/16  Yes Aleksei Plotnikov V, MD  chlorhexidine (PERIDEX) 0.12 % solution RINSE WITH 1/2 OUNCE TWICE DAILY FOR 30 SECONDS AND SPIT OUT AFTER BREAKFAST AND BEFORE BED 10/31/16  Yes Historical Provider, MD  cholecalciferol (VITAMIN D) 1000 UNITS tablet Take 2 tablets (2,000 Units total) by mouth daily. 03/15/15  Yes Aleksei Plotnikov V, MD  glucose blood (ONETOUCH VERIO) test strip Use to check blood sugars twice a day. Dx E11.9 10/23/16  Yes Aleksei Plotnikov V, MD  HYDROmorphone (DILAUDID) 2 MG tablet Take 2-3 tablets (4-6 mg total) by mouth every 6 (six) hours as needed for moderate pain or severe pain. Patient taking differently: Take 2 mg by mouth every 8 (eight) hours as needed for moderate pain or severe pain.  06/24/14  Yes Teryl Lucy, MD  LORazepam (ATIVAN) 2 MG tablet Take 0.5-1 tablets (1-2 mg total) by mouth 3 (three) times daily. Patient taking differently: Take 1-2 mg by mouth 3 (three) times daily. Take 1 tablet in the morning and afternoon.  Take 2 tablets at bedtime 10/03/16  Yes Aleksei Plotnikov V, MD  metFORMIN (GLUCOPHAGE-XR) 500 MG 24 hr tablet TAKE 1 TABLET THREE TIMES A DAY 02/22/15  Yes Aleksei Plotnikov V, MD  metoprolol tartrate (LOPRESSOR) 25 MG tablet TAKE ONE-HALF TABLET (12.5 MG) BY MOUTH TWO TIMES DAILY 09/11/16  Yes Aleksei Plotnikov V, MD  mirtazapine (REMERON) 15 MG tablet TAKE 1 TABLET BY MOUTH AT BEDTIME AT 6-8 PM 05/01/16  Yes Aleksei Plotnikov V, MD  ONETOUCH DELICA LANCETS 33G MISC Use to help check blood sugars twice a day. Dx E11.9 10/23/16  Yes Aleksei Plotnikov V, MD    phenazopyridine (PYRIDIUM) 200 MG tablet Take 1 tablet (200 mg total) by mouth 3 (three) times daily as needed for pain. 11/06/16  Yes Shirline Frees, NP  promethazine (PHENERGAN) 12.5 MG tablet Take 1 tablet (12.5 mg total) by mouth every 6 (six) hours as needed. for nausea 08/02/16  Yes Bonna Gains Nche, NP  repaglinide (PRANDIN) 1 MG tablet Take 1 tablet (1 mg total) by mouth 3 (three) times daily before meals. 07/31/16  Yes Bonna Gains Nche, NP  tolterodine (DETROL LA) 4 MG 24 hr capsule TAKE ONE CAPSULE AT BEDTIME 01/24/16  Yes Aleksei Plotnikov V, MD  umeclidinium-vilanterol (ANORO ELLIPTA) 62.5-25 MCG/INH AEPB Inhale 1 puff into the lungs daily. 10/03/16  Yes Aleksei Plotnikov V, MD  Vitamin D, Ergocalciferol, (DRISDOL) 50000 units CAPS  capsule TAKE 1 CAPSULE (50,000 UNITS TOTAL) BY MOUTH ONCE A WEEK. 03/28/16  Yes Aleksei Plotnikov V, MD  zolpidem (AMBIEN) 10 MG tablet Take 1 tablet (10 mg total) by mouth at bedtime as needed. for sleep Patient taking differently: Take 10 mg by mouth at bedtime. for sleep 07/02/16  Yes Aleksei Plotnikov V, MD  amoxicillin (AMOXIL) 500 MG capsule Take 1 capsule (500 mg total) by mouth every 12 (twelve) hours. 11/10/16   Maryann Mikhail, DO  tamsulosin (FLOMAX) 0.4 MG CAPS capsule Take 1 capsule (0.4 mg total) by mouth daily. 11/11/16   Edsel PetrinMaryann Mikhail, DO    Family History Family History  Problem Relation Age of Onset  . Heart disease Father 3565    MI  . Hypertension Other     Social History Social History  Substance Use Topics  . Smoking status: Former Smoker    Packs/day: 1.00    Years: 20.00    Types: Cigarettes    Quit date: 07/18/2016  . Smokeless tobacco: Never Used  . Alcohol use 6.0 oz/week    10 Glasses of wine per week     Comment: 1 glass of wine daily     Allergies   Asa [aspirin]   Review of Systems Review of Systems  Constitutional: Positive for chills, fatigue and fever.  HENT: Negative for congestion, sinus pressure and sore  throat.   Respiratory: Positive for cough. Negative for shortness of breath.   Cardiovascular: Negative for chest pain.  Gastrointestinal: Positive for nausea.  Musculoskeletal: Positive for back pain and myalgias. Negative for neck pain and neck stiffness.  Skin: Negative for rash and wound.  Neurological: Positive for weakness. Negative for syncope, numbness and headaches.  All other systems reviewed and are negative.    Physical Exam Updated Vital Signs BP 134/78 (BP Location: Right Arm)   Pulse (!) 113   Temp 97.8 F (36.6 C) (Oral)   Resp 20   Ht 5\' 5"  (1.651 m)   Wt 137 lb 12.6 oz (62.5 kg)   SpO2 99%   BMI 22.93 kg/m   Physical Exam  Constitutional: She is oriented to person, place, and time. She appears well-developed and well-nourished.  Chronically ill-appearing  HENT:  Head: Normocephalic and atraumatic.  Dry mucous membranes  Eyes: EOM are normal. Pupils are equal, round, and reactive to light.  Neck: Normal range of motion. Neck supple.  No meningismus  Cardiovascular: Normal rate and regular rhythm.  Exam reveals no gallop and no friction rub.   No murmur heard. Pulmonary/Chest: Effort normal and breath sounds normal.  Abdominal: Soft. Bowel sounds are normal. There is tenderness. There is no rebound and no guarding.   Mild suprapubic tenderness to palpation. No rebound or guarding.  Musculoskeletal: Normal range of motion. She exhibits no edema or tenderness.  No CVA tenderness. No extremity swelling or asymmetry. Distal pulses intact.  Lymphadenopathy:    She has no cervical adenopathy.  Neurological: She is alert and oriented to person, place, and time.  Left upper and lower extremity weakness. 5/5 motor in right extremities. Sensation grossly intact.  Skin: Skin is warm and dry. Capillary refill takes less than 2 seconds. No rash noted. No erythema.  Psychiatric: She has a normal mood and affect. Her behavior is normal.  Nursing note and vitals  reviewed.    ED Treatments / Results  Labs (all labs ordered are listed, but only abnormal results are displayed) Labs Reviewed  COMPREHENSIVE METABOLIC PANEL - Abnormal; Notable for  the following:       Result Value   Sodium 128 (*)    Chloride 89 (*)    Glucose, Bld 302 (*)    BUN 40 (*)    Creatinine, Ser 1.35 (*)    Calcium 7.6 (*)    Total Protein 6.1 (*)    Albumin 3.1 (*)    ALT 7 (*)    GFR calc non Af Amer 40 (*)    GFR calc Af Amer 46 (*)    All other components within normal limits  CBC WITH DIFFERENTIAL/PLATELET - Abnormal; Notable for the following:    WBC 10.8 (*)    RBC 3.75 (*)    Hemoglobin 11.0 (*)    HCT 33.2 (*)    Neutro Abs 9.1 (*)    Lymphs Abs 0.6 (*)    Monocytes Absolute 1.1 (*)    All other components within normal limits  URINALYSIS, ROUTINE W REFLEX MICROSCOPIC - Abnormal; Notable for the following:    Color, Urine AMBER (*)    APPearance CLOUDY (*)    Hgb urine dipstick SMALL (*)    Protein, ur 100 (*)    Nitrite POSITIVE (*)    Leukocytes, UA SMALL (*)    Bacteria, UA MANY (*)    Squamous Epithelial / LPF 0-5 (*)    All other components within normal limits  CBC - Abnormal; Notable for the following:    RBC 3.54 (*)    Hemoglobin 10.5 (*)    HCT 31.5 (*)    All other components within normal limits  GLUCOSE, CAPILLARY - Abnormal; Notable for the following:    Glucose-Capillary 117 (*)    All other components within normal limits  BASIC METABOLIC PANEL - Abnormal; Notable for the following:    Potassium 3.4 (*)    Glucose, Bld 105 (*)    Calcium 7.0 (*)    All other components within normal limits  GLUCOSE, CAPILLARY - Abnormal; Notable for the following:    Glucose-Capillary 105 (*)    All other components within normal limits  GLUCOSE, CAPILLARY - Abnormal; Notable for the following:    Glucose-Capillary 173 (*)    All other components within normal limits  CBC - Abnormal; Notable for the following:    RBC 3.67 (*)     Hemoglobin 10.7 (*)    HCT 32.7 (*)    Platelets 147 (*)    All other components within normal limits  BASIC METABOLIC PANEL - Abnormal; Notable for the following:    Potassium 3.4 (*)    Glucose, Bld 160 (*)    Calcium 7.2 (*)    All other components within normal limits  GLUCOSE, CAPILLARY - Abnormal; Notable for the following:    Glucose-Capillary 148 (*)    All other components within normal limits  GLUCOSE, CAPILLARY - Abnormal; Notable for the following:    Glucose-Capillary 138 (*)    All other components within normal limits  GLUCOSE, CAPILLARY - Abnormal; Notable for the following:    Glucose-Capillary 158 (*)    All other components within normal limits  GLUCOSE, CAPILLARY - Abnormal; Notable for the following:    Glucose-Capillary 153 (*)    All other components within normal limits  I-STAT CG4 LACTIC ACID, ED - Abnormal; Notable for the following:    Lactic Acid, Venous 3.17 (*)    All other components within normal limits  I-STAT CG4 LACTIC ACID, ED - Abnormal; Notable for the following:  Lactic Acid, Venous 2.63 (*)    All other components within normal limits  CULTURE, BLOOD (ROUTINE X 2)  CULTURE, BLOOD (ROUTINE X 2)  URINE CULTURE  MRSA PCR SCREENING  INFLUENZA PANEL BY PCR (TYPE A & B)  LACTIC ACID, PLASMA  PROTIME-INR  APTT  CREATININE, SERUM    EKG  EKG Interpretation  Date/Time:  Thursday November 08 2016 12:01:26 EST Ventricular Rate:  101 PR Interval:    QRS Duration: 77 QT Interval:  340 QTC Calculation: 441 R Axis:   48 Text Interpretation:  Sinus tachycardia Low voltage, precordial leads Borderline repolarization abnormality No significant change since last tracing Confirmed by POLLINA  MD, CHRISTOPHER ([id number]) on 11/09/2016 10:19:46 PM       Radiology No results found.  Procedures Procedures (including critical care time)  Medications Ordered in ED Medications  sodium chloride 0.9 % bolus 1,000 mL (0 mLs Intravenous Stopped 11/08/16  1401)    And  sodium chloride 0.9 % bolus 1,000 mL (0 mLs Intravenous Stopped 11/08/16 1430)  piperacillin-tazobactam (ZOSYN) IVPB 3.375 g (0 g Intravenous Stopped 11/08/16 1330)  vancomycin (VANCOCIN) IVPB 1000 mg/200 mL premix (0 mg Intravenous Stopped 11/08/16 1409)  sodium chloride 0.9 % bolus 1,000 mL (1,000 mLs Intravenous New Bag/Given 11/08/16 1550)  vitamin A & D ointment (1 application  Given 11/09/16 0956)  potassium chloride SA (K-DUR,KLOR-CON) CR tablet 40 mEq (40 mEq Oral Given 11/10/16 0845)     Initial Impression / Assessment and Plan / ED Course  I have reviewed the triage vital signs and the nursing notes.  Pertinent labs & imaging results that were available during my care of the patient were reviewed by me and considered in my medical decision making (see chart for details).     Patient initially hypotensive. Sepsis protocol initiated. Likely source is urinary tract infection. Discussed with hospitalist and we'll admit.  Final Clinical Impressions(s) / ED Diagnoses   Final diagnoses:  Sepsis [facility])    New Prescriptions Discharge Medication List as of 11/10/2016  5:02 PM    START taking these medications   Details  amoxicillin (AMOXIL) 500 MG capsule Take 1 capsule (500 mg total) by mouth every 12 (twelve) hours., Starting Sat 11/10/2016, Normal    tamsulosin (FLOMAX) 0.4 MG CAPS capsule Take 1 capsule (0.4 mg total) by mouth daily., Starting Sun 11/11/2016, Normal         Loren Racer, MD 11/13/16 1550

## 2016-11-14 ENCOUNTER — Other Ambulatory Visit: Payer: Self-pay | Admitting: Internal Medicine

## 2016-11-15 ENCOUNTER — Encounter: Payer: Self-pay | Admitting: Family

## 2016-11-15 ENCOUNTER — Ambulatory Visit (INDEPENDENT_AMBULATORY_CARE_PROVIDER_SITE_OTHER): Payer: Medicare Other | Admitting: Family

## 2016-11-15 VITALS — BP 146/68 | HR 100 | Temp 98.8°F | Resp 16 | Ht 65.0 in

## 2016-11-15 DIAGNOSIS — A4151 Sepsis due to Escherichia coli [E. coli]: Secondary | ICD-10-CM

## 2016-11-15 DIAGNOSIS — I499 Cardiac arrhythmia, unspecified: Secondary | ICD-10-CM | POA: Insufficient documentation

## 2016-11-15 NOTE — Assessment & Plan Note (Signed)
Symptoms of UTI and sepsis have resolved and continues to complete Amoxicillin as prescribed. Kidney function normal upon discharge from the hospital. Will continue current Foley catheter pending voiding trial with Urology. No fevers, abdominal pain or urinary symptoms. Continue current dosage of amoxicillin until completed. Follow up per Urology. Continue to monitor.

## 2016-11-15 NOTE — Telephone Encounter (Signed)
Called refill into [facility] had to leave on pharmacy vm../lmb 

## 2016-11-15 NOTE — Assessment & Plan Note (Signed)
New onset irregular heartbeat noted on physical exam with in office ECG showing sinus rhythm with PVCs. This is a change from previous sinus tachycardia noted about 1 week ago. No evidence of atrial fibrillation or flutter. Continue to monitor and follow up with PCP.

## 2016-11-15 NOTE — Patient Instructions (Addendum)
Thank you for choosing [facility].  SUMMARY AND INSTRUCTIONS:  Please continue to take your medications as prescribed.   Please follow up with Dr. Posey ReaPlotnikov for the irregular heartbeat.  Follow up:  If your symptoms worsen or fail to improve, please contact our office for further instruction, or in case of emergency go directly to the emergency room at the closest medical facility.

## 2016-11-15 NOTE — Progress Notes (Signed)
Subjective:    Patient ID: [name], female    DOB: [birth year], 66 y.o.   MRN: [id number]  Chief Complaint  Patient presents with  . Hospitalization Follow-up    no concerns     HPI:  [name] is a 66 y.o. 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 and presents today for a follow up office visit.  Recently evaluated in the emergency department and admitted to the hospital with generalized weakness, subjective fevers and chills. She had recently been seen by primary care and diagnosed with urinary tract infection and started on ciprofloxacin. Physical exam with abdominal tenderness in the suprapubic region and no rebound or guarding. No CVA tenderness. She was noted to have left upper and lower extremity weakness with sensation grossly intact. EKG showed sinus tachycardia. Her urinalysis was positive for nitrites, leukocytes, and hematuria. Lactic acid was 2.63. Sepsis protocol was initiated with likely source of urinary tract infection. She was diagnosed with severe sepsis secondary to Escherichia coli urinary tract infection and placed on Zosyn. Her urine culture was noted to be pansensitive. Blood cultures were negative. She was discharged with amoxicillin. She was also noted to have acute renal failure secondary to sepsis with creatinine of 1.35 and 0.65 at discharge. Acute rate urinary retention was  noted to be a potential causative factor with Foley catheter removed attempted voiding trial failed with residual bladder volume of 523 mL. She was started with Flomax and recommended follow up with urology. All hospital records, labs, and imaging were reviewed in detail.  She recently followed up with urology and continues to have a Foley catheter in place with the plan to attempt Foley catheter removal and voiding trial in one week. Since leaving the hospital she reports doing very well with no episodes of fevers, confusion, or urinary symptoms. Urine has been clear with no malodor. She is return to baseline. Notes mild elevation in blood pressure. She continues to take the amoxicillin as prescribed and denies adverse side effects with the last pill in the next day.    Allergies  Allergen Reactions  . Asa [Aspirin] Other (See Comments)    "stomach burning", "I can take baby aspirin"      Outpatient Medications Prior to Visit  Medication Sig Dispense Refill  . albuterol (PROVENTIL HFA;VENTOLIN HFA) 108 (90 Base) MCG/ACT inhaler Inhale 1-2 puffs into the lungs every 6 (six) hours as needed for wheezing or shortness of breath. 1 Inhaler 3  . aspirin 81 MG EC tablet Take 81 mg by mouth daily.      Marland Kitchen b complex vitamins tablet Take 1 tablet by mouth daily. 100 tablet 3  . calcium carbonate (OSCAL) 1500 (600 Ca) MG TABS tablet Take 1 tablet (1,500 mg total) by mouth 2 (two) times daily with a meal. 60 tablet 4  . carbidopa-levodopa (SINEMET IR) 25-250 MG tablet TAKE 1 TABLET BY MOUTH 3 (THREE) TIMES DAILY. 270 tablet 3  . chlorhexidine (PERIDEX) 0.12 % solution RINSE WITH 1/2 OUNCE TWICE DAILY FOR  30 SECONDS AND SPIT OUT AFTER BREAKFAST AND BEFORE BED  0  . cholecalciferol (VITAMIN D) 1000 UNITS tablet Take 2 tablets (2,000 Units total) by mouth daily. 100 tablet 3  . glucose blood (ONETOUCH VERIO) test strip Use to check blood sugars twice a day. Dx E11.9 100 each 5  . HYDROmorphone (DILAUDID) 2  MG tablet Take 2-3 tablets (4-6 mg total) by mouth every 6 (six) hours as needed for moderate pain or severe pain. (Patient taking differently: Take 2 mg by mouth every 8 (eight) hours as needed for moderate pain or severe pain. ) 75 tablet 0  . LORazepam (ATIVAN) 2 MG tablet Take 0.5-1 tablets (1-2 mg total) by mouth 3 (three) times daily. (Patient taking differently: Take 1-2 mg by mouth 3 (three) times daily. Take 1 tablet in the morning and afternoon.  Take 2 tablets at bedtime) 90 tablet 3  . metFORMIN (GLUCOPHAGE-XR) 500 MG 24 hr tablet TAKE 1 TABLET THREE TIMES A DAY 270 tablet 1  . metoprolol tartrate (LOPRESSOR) 25 MG tablet TAKE ONE-HALF TABLET (12.5 MG) BY MOUTH TWO TIMES DAILY 90 tablet 2  . mirtazapine (REMERON) 15 MG tablet TAKE 1 TABLET BY MOUTH AT BEDTIME AT 6-8 PM 90 tablet 3  . ONETOUCH DELICA LANCETS 33G MISC Use to help check blood sugars twice a day. Dx E11.9 100 each 3  . promethazine (PHENERGAN) 12.5 MG tablet Take 1 tablet (12.5 mg total) by mouth every 6 (six) hours as needed. for nausea 60 tablet 0  . repaglinide (PRANDIN) 1 MG tablet Take 1 tablet (1 mg total) by mouth 3 (three) times daily before meals. 90 tablet 3  . tamsulosin (FLOMAX) 0.4 MG CAPS capsule Take 1 capsule (0.4 mg total) by mouth daily. 30 capsule 0  . tolterodine (DETROL LA) 4 MG 24 hr capsule TAKE ONE CAPSULE AT BEDTIME 90 capsule 3  . umeclidinium-vilanterol (ANORO ELLIPTA) 62.5-25 MCG/INH AEPB Inhale 1 puff into the lungs daily. 1 each 3  . Vitamin D, Ergocalciferol, (DRISDOL) 50000 units CAPS capsule TAKE 1 CAPSULE (50,000 UNITS TOTAL) BY MOUTH ONCE A WEEK. 6 capsule 0  . zolpidem (AMBIEN) 10 MG tablet TAKE 1 TABLET AT BEDTIME AS NEEDED FOR SLEEP 30 tablet 2  . phenazopyridine (PYRIDIUM) 200 MG tablet Take 1 tablet (200 mg total) by mouth 3 (three) times daily as needed for pain. 10 tablet 0  . amoxicillin (AMOXIL) 500 MG capsule Take 1 capsule (500 mg total) by mouth every 12 (twelve) hours. 10 capsule  0   No facility-administered medications prior to visit.       Past Surgical History:  Procedure Laterality Date  . COLONOSCOPY    . DIAGNOSTIC LAPAROSCOPY     PMH: Exploratory lap  . HERNIA REPAIR     incisional  . ORIF HUMERUS FRACTURE Left 06/24/2014   Procedure: LEFT OPEN REDUCTION INTERNAL FIXATION (ORIF) PROXIMAL HUMERUS FRACTURE;  Surgeon: Eulas PostJoshua P Landau, MD;  Location: [facility] OR;  Service: Orthopedics;  Laterality: Left;  . PANCREATECTOMY    . TONSILLECTOMY    . TUBAL LIGATION        Past Medical History:  Diagnosis Date  . Anoxic brain injury (HCC)   . Anxiety   . Chronic neck pain   . Chronic pain in shoulder   . COPD (chronic obstructive pulmonary disease) (HCC)   . Depression   . Fracture of left humerus 06/24/2014  . Gait disorder   . GERD (gastroesophageal reflux disease)   . Hernia of  abdominal cavity   . 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 Hyperlipidemia   . LBP (low back pain)   . Neuropathy (HCC)   . Osteoarthritis   . Seizure disorder (HCC)   . Seizures (HCC)   . Type II or unspecified type diabetes mellitus without mention of complication, not stated as uncontrolled 2010  . Vitamin B 12 deficiency 2011      Review of Systems  Constitutional: Negative for chills and fever.  Respiratory: Negative for chest tightness and shortness of breath.   Cardiovascular: Negative for chest pain, palpitations and leg swelling.  Gastrointestinal: Negative for abdominal pain, blood in stool, constipation, diarrhea, nausea and vomiting.  Genitourinary: Negative for dysuria, flank pain, frequency, hematuria and urgency.  Psychiatric/Behavioral: Negative for confusion.      Objective:    BP (!) 146/68 (BP Location: Left Arm, Patient Position: Sitting, Cuff Size: Normal)   Pulse 100   Temp 98.8 F (37.1 C) (Oral)   Resp 16   Ht 5\' 5"  (1.651 m)   SpO2 90%  Nursing note and vital signs reviewed.   Physical Exam    Constitutional: She is oriented to person, place, and time. She appears well-developed and well-nourished. No distress.  Cardiovascular: Normal rate, normal heart sounds and intact distal pulses.  An irregular rhythm present. Exam reveals no gallop and no friction rub.   No murmur heard. Pulmonary/Chest: No respiratory distress. She has no wheezes. She has no rales. She exhibits no tenderness.  Abdominal: She exhibits no distension and no mass. There is no tenderness. There is no rebound and no guarding.  Neurological: She is alert and oriented to person, place, and time.  Skin: Skin is warm and dry.  Psychiatric: She has a normal mood and affect. Her behavior is normal. Judgment and thought content normal.       Assessment & Plan:   Problem List Items Addressed This Visit      Other   Sepsis (HCC) - Primary    Symptoms of UTI and sepsis have resolved and continues to complete Amoxicillin as prescribed. Kidney function normal upon discharge from the hospital. Will continue current Foley catheter pending voiding trial with Urology. No fevers, abdominal pain or urinary symptoms. Continue current dosage of amoxicillin until completed. Follow up per Urology. Continue to monitor.       Irregular heart beat    New onset irregular heartbeat noted on physical exam with in office ECG showing sinus rhythm with PVCs. This is a change from previous sinus tachycardia noted about 1 week ago. No evidence of atrial fibrillation or flutter. Continue to monitor and follow up with PCP.      Relevant Orders   EKG 12-Lead (Completed)       I have discontinued Ms. Demarcus's phenazopyridine and amoxicillin. I am also having her maintain her aspirin, HYDROmorphone, metFORMIN, cholecalciferol, tolterodine, Vitamin D (Ergocalciferol), mirtazapine, calcium carbonate, repaglinide, promethazine, albuterol, carbidopa-levodopa, metoprolol tartrate, LORazepam, umeclidinium-vilanterol, b complex vitamins, glucose blood,  ONETOUCH DELICA LANCETS 33G, chlorhexidine, tamsulosin, and zolpidem.   Follow-up: Return if symptoms worsen or fail to improve.  Jeanine Luz, FNP

## 2016-11-16 DIAGNOSIS — M47816 Spondylosis without myelopathy or radiculopathy, lumbar region: Secondary | ICD-10-CM | POA: Diagnosis not present

## 2016-11-16 DIAGNOSIS — Z79891 Long term (current) use of opiate analgesic: Secondary | ICD-10-CM | POA: Diagnosis not present

## 2016-11-16 DIAGNOSIS — G894 Chronic pain syndrome: Secondary | ICD-10-CM | POA: Diagnosis not present

## 2016-11-16 DIAGNOSIS — M47812 Spondylosis without myelopathy or radiculopathy, cervical region: Secondary | ICD-10-CM | POA: Diagnosis not present

## 2016-11-18 ENCOUNTER — Other Ambulatory Visit: Payer: Self-pay | Admitting: Internal Medicine

## 2016-11-19 DIAGNOSIS — J029 Acute pharyngitis, unspecified: Secondary | ICD-10-CM | POA: Diagnosis not present

## 2016-11-19 DIAGNOSIS — I69354 Hemiplegia and hemiparesis following cerebral infarction affecting left non-dominant side: Secondary | ICD-10-CM | POA: Diagnosis not present

## 2016-11-19 DIAGNOSIS — J440 Chronic obstructive pulmonary disease with acute lower respiratory infection: Secondary | ICD-10-CM | POA: Diagnosis not present

## 2016-11-19 DIAGNOSIS — E1165 Type 2 diabetes mellitus with hyperglycemia: Secondary | ICD-10-CM | POA: Diagnosis not present

## 2016-11-19 DIAGNOSIS — J189 Pneumonia, unspecified organism: Secondary | ICD-10-CM | POA: Diagnosis not present

## 2016-11-19 DIAGNOSIS — G40909 Epilepsy, unspecified, not intractable, without status epilepticus: Secondary | ICD-10-CM | POA: Diagnosis not present

## 2016-11-20 NOTE — Telephone Encounter (Signed)
Called refill into [facility] had to leave on pharmacy vm../lmb 

## 2016-11-21 DIAGNOSIS — B9620 Unspecified Escherichia coli [E. coli] as the cause of diseases classified elsewhere: Secondary | ICD-10-CM | POA: Diagnosis not present

## 2016-11-21 DIAGNOSIS — J440 Chronic obstructive pulmonary disease with acute lower respiratory infection: Secondary | ICD-10-CM | POA: Diagnosis not present

## 2016-11-21 DIAGNOSIS — Z7984 Long term (current) use of oral hypoglycemic drugs: Secondary | ICD-10-CM | POA: Diagnosis not present

## 2016-11-21 DIAGNOSIS — E1165 Type 2 diabetes mellitus with hyperglycemia: Secondary | ICD-10-CM | POA: Diagnosis not present

## 2016-11-21 DIAGNOSIS — E1140 Type 2 diabetes mellitus with diabetic neuropathy, unspecified: Secondary | ICD-10-CM | POA: Diagnosis not present

## 2016-11-21 DIAGNOSIS — F329 Major depressive disorder, single episode, unspecified: Secondary | ICD-10-CM | POA: Diagnosis not present

## 2016-11-21 DIAGNOSIS — M150 Primary generalized (osteo)arthritis: Secondary | ICD-10-CM | POA: Diagnosis not present

## 2016-11-21 DIAGNOSIS — Z87891 Personal history of nicotine dependence: Secondary | ICD-10-CM | POA: Diagnosis not present

## 2016-11-21 DIAGNOSIS — R338 Other retention of urine: Secondary | ICD-10-CM | POA: Diagnosis not present

## 2016-11-21 DIAGNOSIS — I69354 Hemiplegia and hemiparesis following cerebral infarction affecting left non-dominant side: Secondary | ICD-10-CM | POA: Diagnosis not present

## 2016-11-21 DIAGNOSIS — N390 Urinary tract infection, site not specified: Secondary | ICD-10-CM | POA: Diagnosis not present

## 2016-11-21 DIAGNOSIS — F419 Anxiety disorder, unspecified: Secondary | ICD-10-CM | POA: Diagnosis not present

## 2016-11-21 DIAGNOSIS — I10 Essential (primary) hypertension: Secondary | ICD-10-CM | POA: Diagnosis not present

## 2016-11-21 DIAGNOSIS — K219 Gastro-esophageal reflux disease without esophagitis: Secondary | ICD-10-CM | POA: Diagnosis not present

## 2016-11-21 DIAGNOSIS — G40909 Epilepsy, unspecified, not intractable, without status epilepticus: Secondary | ICD-10-CM | POA: Diagnosis not present

## 2016-11-22 DIAGNOSIS — I69354 Hemiplegia and hemiparesis following cerebral infarction affecting left non-dominant side: Secondary | ICD-10-CM | POA: Diagnosis not present

## 2016-11-22 DIAGNOSIS — G40909 Epilepsy, unspecified, not intractable, without status epilepticus: Secondary | ICD-10-CM | POA: Diagnosis not present

## 2016-11-22 DIAGNOSIS — E1140 Type 2 diabetes mellitus with diabetic neuropathy, unspecified: Secondary | ICD-10-CM | POA: Diagnosis not present

## 2016-11-22 DIAGNOSIS — N390 Urinary tract infection, site not specified: Secondary | ICD-10-CM | POA: Diagnosis not present

## 2016-11-22 DIAGNOSIS — J440 Chronic obstructive pulmonary disease with acute lower respiratory infection: Secondary | ICD-10-CM | POA: Diagnosis not present

## 2016-11-22 DIAGNOSIS — E1165 Type 2 diabetes mellitus with hyperglycemia: Secondary | ICD-10-CM | POA: Diagnosis not present

## 2016-11-26 DIAGNOSIS — N390 Urinary tract infection, site not specified: Secondary | ICD-10-CM | POA: Diagnosis not present

## 2016-11-26 DIAGNOSIS — J440 Chronic obstructive pulmonary disease with acute lower respiratory infection: Secondary | ICD-10-CM | POA: Diagnosis not present

## 2016-11-26 DIAGNOSIS — E1140 Type 2 diabetes mellitus with diabetic neuropathy, unspecified: Secondary | ICD-10-CM | POA: Diagnosis not present

## 2016-11-26 DIAGNOSIS — G40909 Epilepsy, unspecified, not intractable, without status epilepticus: Secondary | ICD-10-CM | POA: Diagnosis not present

## 2016-11-26 DIAGNOSIS — E1165 Type 2 diabetes mellitus with hyperglycemia: Secondary | ICD-10-CM | POA: Diagnosis not present

## 2016-11-26 DIAGNOSIS — I69354 Hemiplegia and hemiparesis following cerebral infarction affecting left non-dominant side: Secondary | ICD-10-CM | POA: Diagnosis not present

## 2016-11-27 ENCOUNTER — Telehealth: Payer: Self-pay | Admitting: Internal Medicine

## 2016-11-27 DIAGNOSIS — G9340 Encephalopathy, unspecified: Secondary | ICD-10-CM

## 2016-11-27 NOTE — Telephone Encounter (Signed)
Routing to dr plotnikov, please advise, thanks 

## 2016-11-27 NOTE — Telephone Encounter (Signed)
Patient has recently been discharged form Home Health PT and needs to transition to outpatient PT.  Send to [facility]. Fax# [phone].

## 2016-11-28 DIAGNOSIS — R338 Other retention of urine: Secondary | ICD-10-CM | POA: Diagnosis not present

## 2016-11-28 NOTE — Telephone Encounter (Signed)
OK. Thx

## 2016-11-29 DIAGNOSIS — I69354 Hemiplegia and hemiparesis following cerebral infarction affecting left non-dominant side: Secondary | ICD-10-CM | POA: Diagnosis not present

## 2016-11-29 DIAGNOSIS — G40909 Epilepsy, unspecified, not intractable, without status epilepticus: Secondary | ICD-10-CM | POA: Diagnosis not present

## 2016-11-29 DIAGNOSIS — J440 Chronic obstructive pulmonary disease with acute lower respiratory infection: Secondary | ICD-10-CM | POA: Diagnosis not present

## 2016-11-29 DIAGNOSIS — E1165 Type 2 diabetes mellitus with hyperglycemia: Secondary | ICD-10-CM | POA: Diagnosis not present

## 2016-11-29 DIAGNOSIS — N390 Urinary tract infection, site not specified: Secondary | ICD-10-CM | POA: Diagnosis not present

## 2016-11-29 DIAGNOSIS — E1140 Type 2 diabetes mellitus with diabetic neuropathy, unspecified: Secondary | ICD-10-CM | POA: Diagnosis not present

## 2016-11-29 NOTE — Telephone Encounter (Signed)
Dx Encephalopathy Thx

## 2016-11-29 NOTE — Telephone Encounter (Signed)
What diagnosis code for the Neuro Rehab?

## 2016-12-03 DIAGNOSIS — E1140 Type 2 diabetes mellitus with diabetic neuropathy, unspecified: Secondary | ICD-10-CM | POA: Diagnosis not present

## 2016-12-03 DIAGNOSIS — G40909 Epilepsy, unspecified, not intractable, without status epilepticus: Secondary | ICD-10-CM | POA: Diagnosis not present

## 2016-12-03 DIAGNOSIS — N390 Urinary tract infection, site not specified: Secondary | ICD-10-CM | POA: Diagnosis not present

## 2016-12-03 DIAGNOSIS — E1165 Type 2 diabetes mellitus with hyperglycemia: Secondary | ICD-10-CM | POA: Diagnosis not present

## 2016-12-03 DIAGNOSIS — I69354 Hemiplegia and hemiparesis following cerebral infarction affecting left non-dominant side: Secondary | ICD-10-CM | POA: Diagnosis not present

## 2016-12-03 DIAGNOSIS — J440 Chronic obstructive pulmonary disease with acute lower respiratory infection: Secondary | ICD-10-CM | POA: Diagnosis not present

## 2016-12-06 DIAGNOSIS — E1140 Type 2 diabetes mellitus with diabetic neuropathy, unspecified: Secondary | ICD-10-CM | POA: Diagnosis not present

## 2016-12-06 DIAGNOSIS — J440 Chronic obstructive pulmonary disease with acute lower respiratory infection: Secondary | ICD-10-CM | POA: Diagnosis not present

## 2016-12-06 DIAGNOSIS — E1165 Type 2 diabetes mellitus with hyperglycemia: Secondary | ICD-10-CM | POA: Diagnosis not present

## 2016-12-06 DIAGNOSIS — N390 Urinary tract infection, site not specified: Secondary | ICD-10-CM | POA: Diagnosis not present

## 2016-12-06 DIAGNOSIS — I69354 Hemiplegia and hemiparesis following cerebral infarction affecting left non-dominant side: Secondary | ICD-10-CM | POA: Diagnosis not present

## 2016-12-06 DIAGNOSIS — G40909 Epilepsy, unspecified, not intractable, without status epilepticus: Secondary | ICD-10-CM | POA: Diagnosis not present

## 2016-12-07 ENCOUNTER — Ambulatory Visit: Payer: Medicare Other | Admitting: Internal Medicine

## 2016-12-07 ENCOUNTER — Other Ambulatory Visit: Payer: Self-pay | Admitting: Internal Medicine

## 2016-12-13 ENCOUNTER — Telehealth: Payer: Self-pay | Admitting: Internal Medicine

## 2016-12-13 DIAGNOSIS — G9340 Encephalopathy, unspecified: Secondary | ICD-10-CM

## 2016-12-13 NOTE — Telephone Encounter (Signed)
[facility] patient [facility] needs and rx starting that the patient needs PT they stated the order they got did not say which kind on it. The family called advance home care nurse and she called to inform us what they needed.   [phone] FAX for [facility]. See orders here.

## 2016-12-14 DIAGNOSIS — E1140 Type 2 diabetes mellitus with diabetic neuropathy, unspecified: Secondary | ICD-10-CM | POA: Diagnosis not present

## 2016-12-14 NOTE — Telephone Encounter (Signed)
OK. Thx

## 2016-12-14 NOTE — Telephone Encounter (Signed)
Neuro Rehab orders placed for outpatient PT

## 2016-12-20 DIAGNOSIS — F329 Major depressive disorder, single episode, unspecified: Secondary | ICD-10-CM | POA: Diagnosis not present

## 2016-12-20 DIAGNOSIS — I69354 Hemiplegia and hemiparesis following cerebral infarction affecting left non-dominant side: Secondary | ICD-10-CM | POA: Diagnosis not present

## 2016-12-20 DIAGNOSIS — K219 Gastro-esophageal reflux disease without esophagitis: Secondary | ICD-10-CM | POA: Diagnosis not present

## 2016-12-20 DIAGNOSIS — I10 Essential (primary) hypertension: Secondary | ICD-10-CM | POA: Diagnosis not present

## 2016-12-20 DIAGNOSIS — E1140 Type 2 diabetes mellitus with diabetic neuropathy, unspecified: Secondary | ICD-10-CM | POA: Diagnosis not present

## 2016-12-20 DIAGNOSIS — M150 Primary generalized (osteo)arthritis: Secondary | ICD-10-CM | POA: Diagnosis not present

## 2016-12-20 DIAGNOSIS — B9620 Unspecified Escherichia coli [E. coli] as the cause of diseases classified elsewhere: Secondary | ICD-10-CM | POA: Diagnosis not present

## 2016-12-20 DIAGNOSIS — G40909 Epilepsy, unspecified, not intractable, without status epilepticus: Secondary | ICD-10-CM | POA: Diagnosis not present

## 2016-12-20 DIAGNOSIS — N390 Urinary tract infection, site not specified: Secondary | ICD-10-CM | POA: Diagnosis not present

## 2016-12-20 DIAGNOSIS — J440 Chronic obstructive pulmonary disease with acute lower respiratory infection: Secondary | ICD-10-CM | POA: Diagnosis not present

## 2016-12-20 DIAGNOSIS — E1165 Type 2 diabetes mellitus with hyperglycemia: Secondary | ICD-10-CM | POA: Diagnosis not present

## 2016-12-21 DIAGNOSIS — M47816 Spondylosis without myelopathy or radiculopathy, lumbar region: Secondary | ICD-10-CM | POA: Diagnosis not present

## 2016-12-21 DIAGNOSIS — G894 Chronic pain syndrome: Secondary | ICD-10-CM | POA: Diagnosis not present

## 2016-12-21 DIAGNOSIS — Z79891 Long term (current) use of opiate analgesic: Secondary | ICD-10-CM | POA: Diagnosis not present

## 2016-12-24 ENCOUNTER — Encounter: Payer: Self-pay | Admitting: Rehabilitation

## 2016-12-24 ENCOUNTER — Ambulatory Visit: Payer: Medicare Other | Attending: Internal Medicine | Admitting: Rehabilitation

## 2016-12-24 DIAGNOSIS — R2689 Other abnormalities of gait and mobility: Secondary | ICD-10-CM | POA: Insufficient documentation

## 2016-12-24 DIAGNOSIS — M6281 Muscle weakness (generalized): Secondary | ICD-10-CM | POA: Diagnosis not present

## 2016-12-24 DIAGNOSIS — R2681 Unsteadiness on feet: Secondary | ICD-10-CM

## 2016-12-24 NOTE — Therapy (Signed)
[facility] [address] [place], [place], [address] Phone: [phone]   Fax:  [phone]  Physical Therapy Evaluation  Patient Details  Name: [name] MRN: [id number] Date of Birth: Sep 09, 1951 Referring Provider: Jacinta ShoePlotnikov, Aleksei  Encounter Date: 12/24/2016      PT End of Session - 12/24/16 1924    Visit Number 1   Number of Visits 17   Date for PT Re-Evaluation 02/22/17   Authorization Type MCR primary, MCD secondary   PT Start Time 1404   PT Stop Time 1446   PT Time Calculation (min) 42 min   Activity Tolerance Patient tolerated treatment well   Behavior During Therapy [facility] for tasks assessed/performed      Past Medical History:  Diagnosis Date  . Anoxic brain injury (HCC)   . Anxiety   . Chronic neck pain   . Chronic pain in shoulder   . COPD (chronic obstructive pulmonary disease) (HCC)   . Depression   . Fracture of left humerus 06/24/2014  . Gait disorder   . GERD (gastroesophageal reflux disease)   . Hernia of abdominal cavity   . 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 Hyperlipidemia   . LBP (low back pain)   . Neuropathy (HCC)   . Osteoarthritis   . Seizure disorder (HCC)   . Seizures (HCC)   . Type II or unspecified type diabetes mellitus without mention of complication, not stated as uncontrolled 2010  . Vitamin B 12 deficiency 2011    Past Surgical History:  Procedure Laterality Date  . COLONOSCOPY    . DIAGNOSTIC LAPAROSCOPY     PMH: Exploratory lap  . HERNIA REPAIR     incisional  . ORIF HUMERUS FRACTURE Left 06/24/2014   Procedure: LEFT OPEN REDUCTION INTERNAL FIXATION (ORIF) PROXIMAL HUMERUS FRACTURE;  Surgeon: Eulas PostJoshua P Landau, MD;  Location: [facility] OR;  Service: Orthopedics;  Laterality: Left;  . PANCREATECTOMY    . TONSILLECTOMY    . TUBAL LIGATION      There were no vitals filed for this visit.       Subjective Assessment - 12/24/16  1408    Subjective "I'm just not walking as good as I have been.  I can't keep my balance as good as I should be able to."    Patient is accompained by: Family member  [name], friend   Pertinent History history of anoxic brain injury (approx 20 years ago), COPD   Limitations Walking;Standing   Patient Stated Goals "I want to walk."    Currently in Pain? No/denies            [facility] - 12/24/16 1409      Assessment   Medical Diagnosis Encephalopathy   Referring Provider Plotnikov, Aleksei   Onset Date/Surgical Date --  progressive weakness since 2/8 hospitalization   Prior Therapy HHPT     Precautions   Precautions Fall     Restrictions   Weight Bearing Restrictions No     Balance Screen   Has the patient fallen in the past 6 months Yes   How many times? 1   Has the patient had a decrease in activity level because of a fear of falling?  Yes   Is the patient reluctant to leave their home because of a fear of falling?  Yes     Home Environment   Living Environment Private residence   Living Arrangements Spouse/significant other;Non-relatives/Friends  Available Help at Discharge Family;Available 24 hours/day   Type of Home House   Home Access Ramped entrance   Entrance Stairs-Number of Steps 4  in front, doesn't use   Home Layout One level   Home Psychologist, sport and exercise - 2 wheels;Bedside commode;Grab bars - tub/shower;Shower seat - built in  has stationary bike, walk in shower      Prior Function   Level of Independence Needs assistance with ADLs  assist for bathing, dressing   Leisure likes to drink wine, go out to eat, go to the movies      Cognition   Overall Cognitive Status History of cognitive impairments - at baseline     Observation/Other Assessments   Focus on Therapeutic Outcomes (FOTO)  Neuro QOL LE 36.1     Sensation   Light Touch Appears Intact   Hot/Cold Appears Intact   Proprioception Appears Intact     Coordination    Gross Motor Movements are Fluid and Coordinated Yes   Fine Motor Movements are Fluid and Coordinated No   Heel Shin Test decreased coordination due to weakness     Posture/Postural Control   Posture/Postural Control Postural limitations   Postural Limitations Rounded Shoulders;Forward head;Flexed trunk     ROM / Strength   AROM / PROM / Strength Strength     Strength   Overall Strength Deficits   Overall Strength Comments L hip flex 3+/5, L knee flex 3/5, L ankle PF 3+/5, all others grossly 4/5      Flexibility   Soft Tissue Assessment /Muscle Length yes   Hamstrings increased tightness bilaterally     Transfers   Transfers Sit to Stand;Stand to Sit   Sit to Stand 5: Supervision   Sit to Stand Details Verbal cues for sequencing;Verbal cues for technique;Verbal cues for precautions/safety   Five time sit to stand comments  20.84 secs with BUE support and RW in front    Stand to Sit 5: Supervision   Stand to Sit Details (indicate cue type and reason) Verbal cues for sequencing;Verbal cues for technique;Verbal cues for precautions/safety     Ambulation/Gait   Ambulation/Gait Yes   Ambulation/Gait Assistance 4: Min guard;5: Supervision   Ambulation/Gait Assistance Details Cues for keeping RW closer, upright posture and increased R step length.  Pt did show improvement with R step length, however note she continues to need cues throughout for posture and maintaining closeness to RW   Ambulation Distance (Feet) 40 Feet  x 2, 12' x 2   Assistive device Rolling walker   Gait Pattern Step-through pattern;Decreased stride length;Decreased step length - right;Decreased stance time - left;Right flexed knee in stance;Left flexed knee in stance;Trunk flexed   Ambulation Surface Level;Indoor   Gait velocity 1.31 ft/sec with RW     Standardized Balance Assessment   Standardized Balance Assessment Timed Up and Go Test     Timed Up and Go Test   TUG Normal TUG   Normal TUG (seconds) 46.43                            PT Education - 12/24/16 1923    Education provided Yes   Education Details Education on evaluation findings, POC, goals   Person(s) Educated Patient;Caregiver(s)   Methods Explanation   Comprehension Verbalized understanding          PT Short Term Goals - 12/24/16 1932      PT SHORT TERM GOAL #1  Title Patient with caregiver assistance will demonstrate understanding of initial HEP to improve flexibility and balance. (Target Date: 01/21/17)   Time 4   Period Weeks   Status New     PT SHORT TERM GOAL #2   Title Will assess BERG and improve score by 3 points in order to indicate decreased fall risk.     Time 4   Period Weeks   Status New     PT SHORT TERM GOAL #3   Title Pt will improve 5TSS to <16 seconds with single UE support in order to indicate imrpoved functional strength.     Time 4   Period Weeks   Status New     PT SHORT TERM GOAL #4   Title Pt will improve TUG to <40 secs in order to indicate decreased fall risk.     Time 4   Period Weeks   Status New     PT SHORT TERM GOAL #5   Title Pt will ambulate x 200' w/ LRAD at S level over indoor surfaces in order to indicate safe home negotiation.    Time 4   Period Weeks   Status New           PT Long Term Goals - 12/24/16 1936      PT LONG TERM GOAL #1   Title Patient demonstrates with caregiver's cueing progressive HEP correctly. (Target Date: 02/18/17)   Time 8   Period Weeks   Status New     PT LONG TERM GOAL #2   Title Pt will improve BERG balance score by 6 points from baseline in order to indicate decreased fall risk.     Time 8   Period Weeks   Status New     PT LONG TERM GOAL #3   Title Pt will perform TUG <34 secs in order to indicate decreased fall risk.     Time 8   Period Weeks   Status New     PT LONG TERM GOAL #4   Title Pt will perform 8/10 sit<>stand without UE support in order to indicate improved functional strength.     Time 8    Period Weeks   Status New     PT LONG TERM GOAL #5   Title Pt will ambulate up to 350' over unlevel paved surfaces (including curb/ramp) at S level in order to indicate safe short community negotation.     Time 8   Period Weeks   Status New               Plan - 12/24/16 1925    Clinical Impression Statement Pt presents s/p encephalopathy due to recent bout of sepsis and hospitalization on 11/08/16.  Note history of anoxic brain injury, COPD, and L humeral fracture which could all impact progress in therapy.  Upon PT evaluation, note decreased endurance, poor balance and decreased overall strength.  Gait speed is 1.31 ft/sec with RW indicative of recurrent fall risk, 5TSS of 20.84 secs with BUE support, indicative of decreased functional strength, and TUG of 46.43 indicative of high fall risk.  Pt is of evolving presentation and moderate complexity.  She will benefit from skilled OP neuro PT in order to address deficits.     Rehab Potential Good   Clinical Impairments Affecting Rehab Potential co-morbidities, decreased progress in previous therapies   PT Frequency 2x / week   PT Duration 8 weeks   PT Treatment/Interventions ADLs/Self Care Home Management;DME Instruction;Gait training;Stair training;Functional  mobility training;Therapeutic activities;Therapeutic exercise;Balance training;Neuromuscular re-education;Cognitive remediation;Patient/family education;Orthotic Fit/Training;Energy conservation;Vestibular   PT Next Visit Plan BERG, give initial HEP for functional strength (sit<>stand, bridging, hamstring stretch, SLR, etc), gait with RW for improved technique   Consulted and Agree with Plan of Care Patient;Family member/caregiver   Family Member Consulted Caregiver [name]      Patient will benefit from skilled therapeutic intervention in order to improve the following deficits and impairments:  Abnormal gait, Cardiopulmonary status limiting activity, Decreased activity tolerance,  Decreased balance, Decreased cognition, Decreased coordination, Decreased endurance, Decreased knowledge of use of DME, Decreased mobility, Decreased safety awareness, Decreased strength, Impaired perceived functional ability, Impaired flexibility, Impaired UE functional use, Postural dysfunction  Visit Diagnosis: Unsteadiness on feet - Plan: PT plan of care cert/re-cert  Muscle weakness (generalized) - Plan: PT plan of care cert/re-cert  Other abnormalities of gait and mobility - Plan: PT plan of care cert/re-cert      G-Codes - 01/15/2017 1939    Functional Assessment Tool Used (Outpatient Only) gait speed 1.31 ft/sec w/ RW, TUG 46.43 secs with RW   Functional Limitation Mobility: Walking and moving around   Mobility: Walking and Moving Around Current Status [phone]) At least 60 percent but less than 80 percent impaired, limited or restricted   Mobility: Walking and Moving Around Goal Status [phone]) At least 20 percent but less than 40 percent impaired, limited or restricted       Problem List Patient Active Problem List   Diagnosis Date Noted  . Irregular heart beat 11/15/2016  . Urinary tract infection without hematuria   . Severe sepsis (HCC) 11/08/2016  . Leukocytosis 07/31/2016  . Community acquired pneumonia   . Hyperglycemia   . Acute respiratory failure with hypoxia (HCC)   . Acute urinary retention   . Elevated troponin   . Sepsis (HCC) 07/18/2016  . Lobar pneumonia, unspecified organism (HCC) 07/18/2016  . Pharyngitis 07/18/2016  . Elevated troponin I level 07/18/2016  . Oral thrush 07/18/2016  . Odynophagia 07/18/2016  . Anoxic brain injury (HCC) 12/29/2015  . Gait disorder 12/28/2015  . Well adult exam 12/08/2014  . Fracture of left humerus 06/24/2014  . Humerus fracture 06/24/2014  . Chest wall contusion 05/03/2014  . Acute bronchitis 11/10/2013  . Left elbow pain 11/03/2013  . Right facial pain 06/19/2013  . Ankle ulcer (HCC) 04/02/2012  . Nausea &  vomiting 10/05/2011  . Decubitus ulcer 06/26/2011  . Decubitus ulcer of coccyx 05/21/2011  . Cellulitis and abscess of other specified site 10/11/2010  . B12 deficiency 04/04/2010  . HYPERLIPIDEMIA 04/04/2010  . Anxiety state 11/14/2009  . Vitamin D deficiency 10/21/2009  . MELENA 10/21/2009  . DM2 (diabetes mellitus, type 2) (HCC) 07/07/2009  . FEVER, RECURRENT 06/21/2009  . COPD exacerbation (HCC) 02/25/2009  . OSTEOARTHRITIS 02/25/2009  . LOW BACK PAIN 02/25/2009  . TOBACCO USER 01/13/2009  . Rash and other nonspecific skin eruption 01/13/2009  . FURUNCLE 06/08/2008  . DEPRESSION 02/26/2007  . PAIN, CHRONIC NEC 02/26/2007  . Venous (peripheral) insufficiency 02/26/2007  . GERD 02/26/2007  . HERNIA, INCISIONAL 02/26/2007  . SHOULDER PAIN, LEFT 02/26/2007  . 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 INSOMNIA 02/26/2007  . Abnormality of gait 02/26/2007  . LEG EDEMA, CHRONIC 02/26/2007    Harriet Butte, PT, MPT [facility] [address] [place], [place], [address] Phone: [phone]   Fax:  [phone] 01-15-2017, 7:43 PM  Name: [name] MRN: [id number]  Date of Birth: December 07, 1950

## 2016-12-31 ENCOUNTER — Encounter: Payer: Self-pay | Admitting: Rehabilitation

## 2016-12-31 ENCOUNTER — Ambulatory Visit: Payer: Medicare Other | Attending: Internal Medicine | Admitting: Rehabilitation

## 2016-12-31 DIAGNOSIS — R2689 Other abnormalities of gait and mobility: Secondary | ICD-10-CM | POA: Insufficient documentation

## 2016-12-31 DIAGNOSIS — M6281 Muscle weakness (generalized): Secondary | ICD-10-CM | POA: Diagnosis not present

## 2016-12-31 DIAGNOSIS — R2681 Unsteadiness on feet: Secondary | ICD-10-CM | POA: Diagnosis not present

## 2016-12-31 NOTE — Patient Instructions (Addendum)
  Do these in bed.     Bracing With Bridging (Hook-Lying)    With neutral spine, tighten pelvic floor and abdominals and hold. Lift bottom. Repeat __10_ times. Do __1_ times a day.  Have her count out loud so that we know she is breathing.     Copyright  VHI. All rights reserved.   Hip Flexion / Knee Extension: Straight-Leg Raise (Eccentric)    Lie on back. Lift leg with knee straight. Slowly lower leg for 3-5 seconds. _10__ reps per set, _1_ sets per day, __5-7_ days per week. Lower like elevator, stopping at each floor.   Copyright  VHI. All rights reserved.    EXTENSION: Standing (Active)    Stand, both feet flat. Draw right leg behind body as far as possible.  Complete _1__ sets of _10__ repetitions. Perform _1-2__ sessions per day.  http://gtsc.exer.us/77   Copyright  VHI. All rights reserved.   ABDUCTION: Standing (Active)    Stand, feet flat. Lift right leg out to side.  Complete _1_ sets of _10__ repetitions. Perform _1-2__ sessions per day.  http://gtsc.exer.us/111   Copyright  VHI. All rights reserved.

## 2016-12-31 NOTE — Therapy (Signed)
[facility] [address] [place], [place], [address] Phone: [phone]   Fax:  [phone]  Physical Therapy Treatment  Patient Details  Name: [name] MRN: [id number] Date of Birth: March 11, 1951 Referring Provider: Jacinta Shoe  Encounter Date: 12/31/2016      PT End of Session - 12/31/16 1436    Visit Number 2   Number of Visits 17   Date for PT Re-Evaluation 02/22/17   Authorization Type MCR primary, MCD secondary   PT Start Time 1317   PT Stop Time 1401   PT Time Calculation (min) 44 min   Activity Tolerance Patient tolerated treatment well   Behavior During Therapy [facility] for tasks assessed/performed      Past Medical History:  Diagnosis Date  . Anoxic brain injury (HCC)   . Anxiety   . Chronic neck pain   . Chronic pain in shoulder   . COPD (chronic obstructive pulmonary disease) (HCC)   . Depression   . Fracture of left humerus 06/24/2014  . Gait disorder   . GERD (gastroesophageal reflux disease)   . Hernia of abdominal cavity   . 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 Hyperlipidemia   . LBP (low back pain)   . Neuropathy (HCC)   . Osteoarthritis   . Seizure disorder (HCC)   . Seizures (HCC)   . Type II or unspecified type diabetes mellitus without mention of complication, not stated as uncontrolled 2010  . Vitamin B 12 deficiency 2011    Past Surgical History:  Procedure Laterality Date  . COLONOSCOPY    . DIAGNOSTIC LAPAROSCOPY     PMH: Exploratory lap  . HERNIA REPAIR     incisional  . ORIF HUMERUS FRACTURE Left 06/24/2014   Procedure: LEFT OPEN REDUCTION INTERNAL FIXATION (ORIF) PROXIMAL HUMERUS FRACTURE;  Surgeon: Eulas Post, MD;  Location: [facility] OR;  Service: Orthopedics;  Laterality: Left;  . PANCREATECTOMY    . TONSILLECTOMY    . TUBAL LIGATION      There were no vitals filed for this visit.      Subjective Assessment - 12/31/16  1320    Subjective Reports no changes, no falls.    Patient is accompained by: Family member   Pertinent History history of anoxic brain injury (approx 20 years ago), COPD   Limitations Walking;Standing   Patient Stated Goals "I want to walk."    Currently in Pain? No/denies                         [facility] Adult PT Treatment/Exercise - 12/31/16 1320      Standardized Balance Assessment   Standardized Balance Assessment Berg Balance Test     Berg Balance Test   Sit to Stand Able to stand  independently using hands   Standing Unsupported Able to stand 2 minutes with supervision   Sitting with Back Unsupported but Feet Supported on Floor or Stool Able to sit safely and securely 2 minutes   Stand to Sit Controls descent by using hands   Transfers Needs one person to assist   Standing Unsupported with Eyes Closed Able to stand 10 seconds with supervision   Standing Ubsupported with Feet Together Needs help to attain position and unable to hold for 15 seconds   From Standing, Reach Forward with Outstretched Arm Reaches forward but needs supervision   From Standing Position, Pick up Object from Floor Able to  pick up shoe, needs supervision   From Standing Position, Turn to Look Behind Over each Shoulder Needs assist to keep from losing balance and falling   Turn 360 Degrees Needs assistance while turning   Standing Unsupported, Alternately Place Feet on Step/Stool Needs assistance to keep from falling or unable to try   Standing Unsupported, One Foot in Front Needs help to step but can hold 15 seconds   Standing on One Leg Unable to try or needs assist to prevent fall   Total Score 22      TE:  See pt instruction for details on exercises and reps performed for HEP.            PT Education - 12/31/16 1435    Education provided Yes   Education Details HEP, see pt instruction   Person(s) Educated Patient;Caregiver(s)   Methods Explanation;Demonstration;Handout    Comprehension Verbalized understanding;Returned demonstration          PT Short Term Goals - 12/24/16 1932      PT SHORT TERM GOAL #1   Title Patient with caregiver assistance will demonstrate understanding of initial HEP to improve flexibility and balance. (Target Date: 01/21/17)   Time 4   Period Weeks   Status New     PT SHORT TERM GOAL #2   Title Will assess BERG and improve score by 3 points in order to indicate decreased fall risk.     Time 4   Period Weeks   Status New     PT SHORT TERM GOAL #3   Title Pt will improve 5TSS to <16 seconds with single UE support in order to indicate imrpoved functional strength.     Time 4   Period Weeks   Status New     PT SHORT TERM GOAL #4   Title Pt will improve TUG to <40 secs in order to indicate decreased fall risk.     Time 4   Period Weeks   Status New     PT SHORT TERM GOAL #5   Title Pt will ambulate x 200' w/ LRAD at S level over indoor surfaces in order to indicate safe home negotiation.    Time 4   Period Weeks   Status New           PT Long Term Goals - 12/24/16 1936      PT LONG TERM GOAL #1   Title Patient demonstrates with caregiver's cueing progressive HEP correctly. (Target Date: 02/18/17)   Time 8   Period Weeks   Status New     PT LONG TERM GOAL #2   Title Pt will improve BERG balance score by 6 points from baseline in order to indicate decreased fall risk.     Time 8   Period Weeks   Status New     PT LONG TERM GOAL #3   Title Pt will perform TUG <34 secs in order to indicate decreased fall risk.     Time 8   Period Weeks   Status New     PT LONG TERM GOAL #4   Title Pt will perform 8/10 sit<>stand without UE support in order to indicate improved functional strength.     Time 8   Period Weeks   Status New     PT LONG TERM GOAL #5   Title Pt will ambulate up to 350' over unlevel paved surfaces (including curb/ramp) at S level in order to indicate safe short community negotation.  Time  8   Period Weeks   Status New               Plan - 12/31/16 1436    Clinical Impression Statement Skilled session focused on providing pt with initial HEP for BLE strengthening and balance, see pt instruction for details.  Also performed BERG balance test with score of 22/56 indicative of 100% fall risk.  Educated pt and caregiver on results.     Rehab Potential Good   Clinical Impairments Affecting Rehab Potential co-morbidities, decreased progress in previous therapies   PT Frequency 2x / week   PT Duration 8 weeks   PT Treatment/Interventions ADLs/Self Care Home Management;DME Instruction;Gait training;Stair training;Functional mobility training;Therapeutic activities;Therapeutic exercise;Balance training;Neuromuscular re-education;Cognitive remediation;Patient/family education;Orthotic Fit/Training;Energy conservation;Vestibular   PT Next Visit Plan check compliance with HEP, give hamstring stretch, gait with RW for improved technique, postural exercises, anything where she has to WB through LLE   Consulted and Agree with Plan of Care Patient;Family member/caregiver   Family Member Consulted Caregiver [name]      Patient will benefit from skilled therapeutic intervention in order to improve the following deficits and impairments:  Abnormal gait, Cardiopulmonary status limiting activity, Decreased activity tolerance, Decreased balance, Decreased cognition, Decreased coordination, Decreased endurance, Decreased knowledge of use of DME, Decreased mobility, Decreased safety awareness, Decreased strength, Impaired perceived functional ability, Impaired flexibility, Impaired UE functional use, Postural dysfunction  Visit Diagnosis: Unsteadiness on feet  Muscle weakness (generalized)  Other abnormalities of gait and mobility     Problem List Patient Active Problem List   Diagnosis Date Noted  . Irregular heart beat 11/15/2016  . Urinary tract infection without hematuria   .  Severe sepsis (HCC) 11/08/2016  . Leukocytosis 07/31/2016  . Community acquired pneumonia   . Hyperglycemia   . Acute respiratory failure with hypoxia (HCC)   . Acute urinary retention   . Elevated troponin   . Sepsis (HCC) 07/18/2016  . Lobar pneumonia, unspecified organism (HCC) 07/18/2016  . Pharyngitis 07/18/2016  . Elevated troponin I level 07/18/2016  . Oral thrush 07/18/2016  . Odynophagia 07/18/2016  . Anoxic brain injury (HCC) 12/29/2015  . Gait disorder 12/28/2015  . Well adult exam 12/08/2014  . Fracture of left humerus 06/24/2014  . Humerus fracture 06/24/2014  . Chest wall contusion 05/03/2014  . Acute bronchitis 11/10/2013  . Left elbow pain 11/03/2013  . Right facial pain 06/19/2013  . Ankle ulcer (HCC) 04/02/2012  . Nausea & vomiting 10/05/2011  . Decubitus ulcer 06/26/2011  . Decubitus ulcer of coccyx 05/21/2011  . Cellulitis and abscess of other specified site 10/11/2010  . B12 deficiency 04/04/2010  . HYPERLIPIDEMIA 04/04/2010  . Anxiety state 11/14/2009  . Vitamin D deficiency 10/21/2009  . MELENA 10/21/2009  . DM2 (diabetes mellitus, type 2) (HCC) 07/07/2009  . FEVER, RECURRENT 06/21/2009  . COPD exacerbation (HCC) 02/25/2009  . OSTEOARTHRITIS 02/25/2009  . LOW BACK PAIN 02/25/2009  . TOBACCO USER 01/13/2009  . Rash and other nonspecific skin eruption 01/13/2009  . FURUNCLE 06/08/2008  . DEPRESSION 02/26/2007  . PAIN, CHRONIC NEC 02/26/2007  . Venous (peripheral) insufficiency 02/26/2007  . GERD 02/26/2007  . HERNIA, INCISIONAL 02/26/2007  . SHOULDER PAIN, LEFT 02/26/2007  . 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 INSOMNIA 02/26/2007  . Abnormality of gait 02/26/2007  . LEG EDEMA, CHRONIC 02/26/2007    Harriet Butte, PT, MPT [facility] [address] [place], [place], [address] Phone:  [phone]   Fax:  [phone] 12/31/16, 2:39 PM  Name: [name] MRN:  [id number] Date of Birth: 11-Jan-1951

## 2017-01-03 ENCOUNTER — Encounter: Payer: Self-pay | Admitting: Rehabilitation

## 2017-01-03 ENCOUNTER — Ambulatory Visit: Payer: Medicare Other | Admitting: Rehabilitation

## 2017-01-03 DIAGNOSIS — M6281 Muscle weakness (generalized): Secondary | ICD-10-CM

## 2017-01-03 DIAGNOSIS — R2681 Unsteadiness on feet: Secondary | ICD-10-CM

## 2017-01-03 DIAGNOSIS — R2689 Other abnormalities of gait and mobility: Secondary | ICD-10-CM | POA: Diagnosis not present

## 2017-01-04 NOTE — Therapy (Signed)
[facility] [address] [place], [place], [address] Phone: [phone]   Fax:  [phone]  Physical Therapy Treatment  Patient Details  Name: [name] MRN: [id number] Date of Birth: 29-Jul-1951 Referring Provider: Jacinta Shoe  Encounter Date: 01/03/2017      PT End of Session - 01/03/17 1415    Visit Number 3   Number of Visits 17   Date for PT Re-Evaluation 02/22/17   Authorization Type MCR primary, MCD secondary   PT Start Time 1410  PT late from previous appt   PT Stop Time 1445   PT Time Calculation (min) 35 min   Activity Tolerance Patient tolerated treatment well   Behavior During Therapy [facility] for tasks assessed/performed      Past Medical History:  Diagnosis Date  . Anoxic brain injury (HCC)   . Anxiety   . Chronic neck pain   . Chronic pain in shoulder   . COPD (chronic obstructive pulmonary disease) (HCC)   . Depression   . Fracture of left humerus 06/24/2014  . Gait disorder   . GERD (gastroesophageal reflux disease)   . Hernia of abdominal cavity   . 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 Hyperlipidemia   . LBP (low back pain)   . Neuropathy (HCC)   . Osteoarthritis   . Seizure disorder (HCC)   . Seizures (HCC)   . Type II or unspecified type diabetes mellitus without mention of complication, not stated as uncontrolled 2010  . Vitamin B 12 deficiency 2011    Past Surgical History:  Procedure Laterality Date  . COLONOSCOPY    . DIAGNOSTIC LAPAROSCOPY     PMH: Exploratory lap  . HERNIA REPAIR     incisional  . ORIF HUMERUS FRACTURE Left 06/24/2014   Procedure: LEFT OPEN REDUCTION INTERNAL FIXATION (ORIF) PROXIMAL HUMERUS FRACTURE;  Surgeon: Eulas Post, MD;  Location: [facility] OR;  Service: Orthopedics;  Laterality: Left;  . PANCREATECTOMY    . TONSILLECTOMY    . TUBAL LIGATION      There were no vitals filed for this visit.       Subjective Assessment - 01/03/17 1414    Subjective No changes, no falls since last visit.    Pertinent History history of anoxic brain injury (approx 20 years ago), COPD   Limitations Walking;Standing   Patient Stated Goals "I want to walk."    Currently in Pain? No/denies                         [facility] Adult PT Treatment/Exercise - 01/03/17 1415      Transfers   Transfers Sit to Stand;Stand to Sit   Sit to Stand 5: Supervision   Sit to Stand Details Verbal cues for sequencing;Verbal cues for technique;Verbal cues for precautions/safety   Sit to Stand Details (indicate cue type and reason) Performed sit<>stand x 5 reps during session in order to emphasis increased LE activation, equal WB and upright posture.  Pt tends to keep weight shifted onto RLE, forward flexed posture and needs cues to scoot out as not to use back of mat for support.     Stand to Sit 5: Supervision   Stand to Sit Details (indicate cue type and reason) Verbal cues for sequencing;Verbal cues for technique;Verbal cues for precautions/safety     Ambulation/Gait   Ambulation/Gait Yes   Ambulation/Gait Assistance 4: Min guard   Ambulation/Gait Assistance  Details Worked on gait prior to and following NMR tasks to continue to address improved gait quality.  Pt performed 115' x 1, and another 78' x 1 (unable to complete full lap due to fatigue) with RW at min/guard level.  Pt requires facilitation at hips and trunk for improved upright posture and increased hip protraction.  Continued cues for smooth gait pattern as she tends to "hop" off of LLE with quick R step.  Following NMR tasks and hip stretching, note mild improvement in gait and posture with improved fluidity.   Pt tolerated well.    Ambulation Distance (Feet) 115 Feet  80   Assistive device Rolling walker   Gait Pattern Step-through pattern;Decreased stride length;Decreased step length - right;Decreased stance time - left;Right flexed knee in  stance;Left flexed knee in stance;Trunk flexed   Ambulation Surface Level;Indoor     Neuro Re-ed    Neuro Re-ed Details  Worked on NMR tasks in standing to promote upright posture and increased weight shift and WB through LLE.  Had pt step to/from target with RLE with B UE support>single UE support with tactile and verbal cues for L lateral weight shift with hips rather than head/shoulders.  Pt did demonstrate improvement within task.  Then progressed to having pt step RLE to 2" step with single UE support only.  Heavy facilitation for L weight shift, but she did demonstrate improvement within session.      Exercises   Exercises Other Exercises   Other Exercises  B hip flex stretch on EOM with LE off EOM x 3 mins each side.                  PT Education - 01/03/17 1414    Education provided Yes   Education Details hip flex stretch, did not provide handout during this session.    Person(s) Educated Patient   Methods Explanation   Comprehension Verbalized understanding          PT Short Term Goals - 12/24/16 1932      PT SHORT TERM GOAL #1   Title Patient with caregiver assistance will demonstrate understanding of initial HEP to improve flexibility and balance. (Target Date: 01/21/17)   Time 4   Period Weeks   Status New     PT SHORT TERM GOAL #2   Title Will assess BERG and improve score by 3 points in order to indicate decreased fall risk.     Time 4   Period Weeks   Status New     PT SHORT TERM GOAL #3   Title Pt will improve 5TSS to <16 seconds with single UE support in order to indicate imrpoved functional strength.     Time 4   Period Weeks   Status New     PT SHORT TERM GOAL #4   Title Pt will improve TUG to <40 secs in order to indicate decreased fall risk.     Time 4   Period Weeks   Status New     PT SHORT TERM GOAL #5   Title Pt will ambulate x 200' w/ LRAD at S level over indoor surfaces in order to indicate safe home negotiation.    Time 4    Period Weeks   Status New           PT Long Term Goals - 12/24/16 1936      PT LONG TERM GOAL #1   Title Patient demonstrates with caregiver's cueing progressive HEP correctly. (Target  Date: 02/18/17)   Time 8   Period Weeks   Status New     PT LONG TERM GOAL #2   Title Pt will improve BERG balance score by 6 points from baseline in order to indicate decreased fall risk.     Time 8   Period Weeks   Status New     PT LONG TERM GOAL #3   Title Pt will perform TUG <34 secs in order to indicate decreased fall risk.     Time 8   Period Weeks   Status New     PT LONG TERM GOAL #4   Title Pt will perform 8/10 sit<>stand without UE support in order to indicate improved functional strength.     Time 8   Period Weeks   Status New     PT LONG TERM GOAL #5   Title Pt will ambulate up to 350' over unlevel paved surfaces (including curb/ramp) at S level in order to indicate safe short community negotation.     Time 8   Period Weeks   Status New               Plan - 01/04/17 3662    Clinical Impression Statement Skilled session focused on gait and NMR to carryover to improved gait pattern and efficiency.  Pt tolerated well, but requires several seated rest breaks during activity.  Note some carryover from session to session, but requires mod cues for same gait deficits.     Rehab Potential Good   Clinical Impairments Affecting Rehab Potential co-morbidities, decreased progress in previous therapies   PT Frequency 2x / week   PT Duration 8 weeks   PT Treatment/Interventions ADLs/Self Care Home Management;DME Instruction;Gait training;Stair training;Functional mobility training;Therapeutic activities;Therapeutic exercise;Balance training;Neuromuscular re-education;Cognitive remediation;Patient/family education;Orthotic Fit/Training;Energy conservation;Vestibular   PT Next Visit Plan check compliance with HEP, give hamstring stretch and hip flex stretch, gait with RW for improved  technique, postural exercises, anything where she has to WB through LLE   Consulted and Agree with Plan of Care Patient;Family member/caregiver   Family Member Consulted Caregiver [name]      Patient will benefit from skilled therapeutic intervention in order to improve the following deficits and impairments:  Abnormal gait, Cardiopulmonary status limiting activity, Decreased activity tolerance, Decreased balance, Decreased cognition, Decreased coordination, Decreased endurance, Decreased knowledge of use of DME, Decreased mobility, Decreased safety awareness, Decreased strength, Impaired perceived functional ability, Impaired flexibility, Impaired UE functional use, Postural dysfunction  Visit Diagnosis: Unsteadiness on feet  Muscle weakness (generalized)  Other abnormalities of gait and mobility     Problem List Patient Active Problem List   Diagnosis Date Noted  . Irregular heart beat 11/15/2016  . Urinary tract infection without hematuria   . Severe sepsis (HCC) 11/08/2016  . Leukocytosis 07/31/2016  . Community acquired pneumonia   . Hyperglycemia   . Acute respiratory failure with hypoxia (HCC)   . Acute urinary retention   . Elevated troponin   . Sepsis (HCC) 07/18/2016  . Lobar pneumonia, unspecified organism (HCC) 07/18/2016  . Pharyngitis 07/18/2016  . Elevated troponin I level 07/18/2016  . Oral thrush 07/18/2016  . Odynophagia 07/18/2016  . Anoxic brain injury (HCC) 12/29/2015  . Gait disorder 12/28/2015  . Well adult exam 12/08/2014  . Fracture of left humerus 06/24/2014  . Humerus fracture 06/24/2014  . Chest wall contusion 05/03/2014  . Acute bronchitis 11/10/2013  . Left elbow pain 11/03/2013  . Right facial pain 06/19/2013  . Ankle ulcer (  HCC) 04/02/2012  . Nausea & vomiting 10/05/2011  . Decubitus ulcer 06/26/2011  . Decubitus ulcer of coccyx 05/21/2011  . Cellulitis and abscess of other specified site 10/11/2010  . B12 deficiency 04/04/2010  .  HYPERLIPIDEMIA 04/04/2010  . Anxiety state 11/14/2009  . Vitamin D deficiency 10/21/2009  . MELENA 10/21/2009  . DM2 (diabetes mellitus, type 2) (HCC) 07/07/2009  . FEVER, RECURRENT 06/21/2009  . COPD exacerbation (HCC) 02/25/2009  . OSTEOARTHRITIS 02/25/2009  . LOW BACK PAIN 02/25/2009  . TOBACCO USER 01/13/2009  . Rash and other nonspecific skin eruption 01/13/2009  . FURUNCLE 06/08/2008  . DEPRESSION 02/26/2007  . PAIN, CHRONIC NEC 02/26/2007  . Venous (peripheral) insufficiency 02/26/2007  . GERD 02/26/2007  . HERNIA, INCISIONAL 02/26/2007  . SHOULDER PAIN, LEFT 02/26/2007  . 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 INSOMNIA 02/26/2007  . Abnormality of gait 02/26/2007  . LEG EDEMA, CHRONIC 02/26/2007    Harriet Butte, PT, MPT [facility] [address] [place], [place], [address] Phone: [phone]   Fax:  [phone] 01/04/17, 6:25 AM   Name: JAKYLA REZA MRN: [id number] Date of Birth: Sep 04, 1951

## 2017-01-06 ENCOUNTER — Other Ambulatory Visit: Payer: Self-pay | Admitting: Internal Medicine

## 2017-01-07 ENCOUNTER — Ambulatory Visit: Payer: Medicare Other | Admitting: Rehabilitation

## 2017-01-07 ENCOUNTER — Encounter: Payer: Self-pay | Admitting: Rehabilitation

## 2017-01-07 DIAGNOSIS — R2681 Unsteadiness on feet: Secondary | ICD-10-CM | POA: Diagnosis not present

## 2017-01-07 DIAGNOSIS — R2689 Other abnormalities of gait and mobility: Secondary | ICD-10-CM | POA: Diagnosis not present

## 2017-01-07 DIAGNOSIS — M6281 Muscle weakness (generalized): Secondary | ICD-10-CM

## 2017-01-07 NOTE — Therapy (Signed)
[facility] [address] [place], [place], [address] Phone: [phone]   Fax:  [phone]  Physical Therapy Treatment  Patient Details  Name: [name] MRN: [id number] Date of Birth: [birth year] Referring Provider: Jacinta Shoe  Encounter Date: 01/07/2017      PT End of Session - 01/07/17 1832    Visit Number 4   Number of Visits 17   Date for PT Re-Evaluation 02/22/17   Authorization Type MCR primary, MCD secondary   PT Start Time 1402   PT Stop Time 1445   PT Time Calculation (min) 43 min   Activity Tolerance Patient tolerated treatment well   Behavior During Therapy [facility] for tasks assessed/performed      Past Medical History:  Diagnosis Date  . Anoxic brain injury (HCC)   . Anxiety   . Chronic neck pain   . Chronic pain in shoulder   . COPD (chronic obstructive pulmonary disease) (HCC)   . Depression   . Fracture of left humerus 06/24/2014  . Gait disorder   . GERD (gastroesophageal reflux disease)   . Hernia of abdominal cavity   . 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 Hyperlipidemia   . LBP (low back pain)   . Neuropathy (HCC)   . Osteoarthritis   . Seizure disorder (HCC)   . Seizures (HCC)   . Type II or unspecified type diabetes mellitus without mention of complication, not stated as uncontrolled 2010  . Vitamin B 12 deficiency 2011    Past Surgical History:  Procedure Laterality Date  . COLONOSCOPY    . DIAGNOSTIC LAPAROSCOPY     PMH: Exploratory lap  . HERNIA REPAIR     incisional  . ORIF HUMERUS FRACTURE Left 06/24/2014   Procedure: LEFT OPEN REDUCTION INTERNAL FIXATION (ORIF) PROXIMAL HUMERUS FRACTURE;  Surgeon: Eulas Post, MD;  Location: [facility] OR;  Service: Orthopedics;  Laterality: Left;  . PANCREATECTOMY    . TONSILLECTOMY    . TUBAL LIGATION      There were no vitals filed for this visit.      Subjective Assessment - 01/07/17  1410    Subjective No changes, no falls since last visit.    Patient is accompained by: Family member   Pertinent History history of anoxic brain injury (approx 20 years ago), COPD   Limitations Walking;Standing   Patient Stated Goals "I want to walk."    Currently in Pain? No/denies                         [facility] Adult PT Treatment/Exercise - 01/07/17 0001      Ambulation/Gait   Ambulation/Gait Yes   Ambulation/Gait Assistance 4: Min guard   Ambulation/Gait Assistance Details Pt ambulatory into clinic today with RW (husband providing assist).  PT took over from lobby with cues for posture, closeness to RW and increased R step length.  Pt continues to be able to correct within session, but see little carryover from session to session.     Ambulation Distance (Feet) 85 Feet   Assistive device Rolling walker   Gait Pattern Step-through pattern;Decreased stride length;Decreased step length - right;Decreased stance time - left;Right flexed knee in stance;Left flexed knee in stance;Trunk flexed   Ambulation Surface Level;Indoor     Neuro Re-ed    Neuro Re-ed Details  NMR for improved postural control, upright posture, increased hip extension and improved L lateral weight shift  for increaesd R step length with gait without AD.  Performed 25' without device at min to mod A level with constant cues for posture, increaesd hip forward protraction while at the same time shifting to L when advancing RLE.  Had pt place her hands on PTs shoulders for improved posture.  Note improvement within NMR training, but does require constant max cuing.  Also performed standing exercise with pt advancing RLE to and from 2" step.  Pt did demonstrate improved ability to maintain upright posture, however still needs increaesd assist to maintain and improved L lateral weight shift.       Exercises   Other Exercises  B hip flex stretch x 2 mins supine on EOM, seated hamstring stretch with foot on small  step x 1 min each side.                 PT Education - 01/07/17 1410    Education provided Yes   Education Details Continued importance of increasing gait at home, decreased use of w/c.    Person(s) Educated Patient   Methods Explanation   Comprehension Verbalized understanding          PT Short Term Goals - 12/24/16 1932      PT SHORT TERM GOAL #1   Title Patient with caregiver assistance will demonstrate understanding of initial HEP to improve flexibility and balance. (Target Date: 01/21/17)   Time 4   Period Weeks   Status New     PT SHORT TERM GOAL #2   Title Will assess BERG and improve score by 3 points in order to indicate decreased fall risk.     Time 4   Period Weeks   Status New     PT SHORT TERM GOAL #3   Title Pt will improve 5TSS to <16 seconds with single UE support in order to indicate imrpoved functional strength.     Time 4   Period Weeks   Status New     PT SHORT TERM GOAL #4   Title Pt will improve TUG to <40 secs in order to indicate decreased fall risk.     Time 4   Period Weeks   Status New     PT SHORT TERM GOAL #5   Title Pt will ambulate x 200' w/ LRAD at S level over indoor surfaces in order to indicate safe home negotiation.    Time 4   Period Weeks   Status New           PT Long Term Goals - 12/24/16 1936      PT LONG TERM GOAL #1   Title Patient demonstrates with caregiver's cueing progressive HEP correctly. (Target Date: 02/18/17)   Time 8   Period Weeks   Status New     PT LONG TERM GOAL #2   Title Pt will improve BERG balance score by 6 points from baseline in order to indicate decreased fall risk.     Time 8   Period Weeks   Status New     PT LONG TERM GOAL #3   Title Pt will perform TUG <34 secs in order to indicate decreased fall risk.     Time 8   Period Weeks   Status New     PT LONG TERM GOAL #4   Title Pt will perform 8/10 sit<>stand without UE support in order to indicate improved functional  strength.     Time 8   Period Weeks  Status New     PT LONG TERM GOAL #5   Title Pt will ambulate up to 350' over unlevel paved surfaces (including curb/ramp) at S level in order to indicate safe short community negotation.     Time 8   Period Weeks   Status New               Plan - 01/07/17 1832    Clinical Impression Statement Skilled session continues to focus on NMR with gait and standing exercises to increase upright posture, improved forward hip protraction B, esp on L, and increased L lateral weight shift.     Rehab Potential Good   Clinical Impairments Affecting Rehab Potential co-morbidities, decreased progress in previous therapies   PT Frequency 2x / week   PT Duration 8 weeks   PT Treatment/Interventions ADLs/Self Care Home Management;DME Instruction;Gait training;Stair training;Functional mobility training;Therapeutic activities;Therapeutic exercise;Balance training;Neuromuscular re-education;Cognitive remediation;Patient/family education;Orthotic Fit/Training;Energy conservation;Vestibular   PT Next Visit Plan check compliance with HEP, give hamstring stretch and hip flex stretch, gait with RW for improved technique, postural exercises, anything where she has to WB through LLE   Consulted and Agree with Plan of Care Patient;Family member/caregiver   Family Member Consulted Caregiver [name]      Patient will benefit from skilled therapeutic intervention in order to improve the following deficits and impairments:  Abnormal gait, Cardiopulmonary status limiting activity, Decreased activity tolerance, Decreased balance, Decreased cognition, Decreased coordination, Decreased endurance, Decreased knowledge of use of DME, Decreased mobility, Decreased safety awareness, Decreased strength, Impaired perceived functional ability, Impaired flexibility, Impaired UE functional use, Postural dysfunction  Visit Diagnosis: Unsteadiness on feet  Muscle weakness  (generalized)  Other abnormalities of gait and mobility     Problem List Patient Active Problem List   Diagnosis Date Noted  . Irregular heart beat 11/15/2016  . Urinary tract infection without hematuria   . Severe sepsis (HCC) 11/08/2016  . Leukocytosis 07/31/2016  . Community acquired pneumonia   . Hyperglycemia   . Acute respiratory failure with hypoxia (HCC)   . Acute urinary retention   . Elevated troponin   . Sepsis (HCC) 07/18/2016  . Lobar pneumonia, unspecified organism (HCC) 07/18/2016  . Pharyngitis 07/18/2016  . Elevated troponin I level 07/18/2016  . Oral thrush 07/18/2016  . Odynophagia 07/18/2016  . Anoxic brain injury (HCC) 12/29/2015  . Gait disorder 12/28/2015  . Well adult exam 12/08/2014  . Fracture of left humerus 06/24/2014  . Humerus fracture 06/24/2014  . Chest wall contusion 05/03/2014  . Acute bronchitis 11/10/2013  . Left elbow pain 11/03/2013  . Right facial pain 06/19/2013  . Ankle ulcer (HCC) 04/02/2012  . Nausea & vomiting 10/05/2011  . Decubitus ulcer 06/26/2011  . Decubitus ulcer of coccyx 05/21/2011  . Cellulitis and abscess of other specified site 10/11/2010  . B12 deficiency 04/04/2010  . HYPERLIPIDEMIA 04/04/2010  . Anxiety state 11/14/2009  . Vitamin D deficiency 10/21/2009  . MELENA 10/21/2009  . DM2 (diabetes mellitus, type 2) (HCC) 07/07/2009  . FEVER, RECURRENT 06/21/2009  . COPD exacerbation (HCC) 02/25/2009  . OSTEOARTHRITIS 02/25/2009  . LOW BACK PAIN 02/25/2009  . TOBACCO USER 01/13/2009  . Rash and other nonspecific skin eruption 01/13/2009  . FURUNCLE 06/08/2008  . DEPRESSION 02/26/2007  . PAIN, CHRONIC NEC 02/26/2007  . Venous (peripheral) insufficiency 02/26/2007  . GERD 02/26/2007  . HERNIA, INCISIONAL 02/26/2007  . SHOULDER PAIN, LEFT 02/26/2007  . 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 INSOMNIA 02/26/2007  .  Abnormality of gait 02/26/2007  . LEG EDEMA, CHRONIC  02/26/2007    Harriet Butte, PT, MPT [facility] [address] [place], [place], [address] Phone: [phone]   Fax:  [phone] 01/07/17, 6:36 PM  Name: SUSI GOSLIN MRN: [id number] Date of Birth: 27-May-1951

## 2017-01-10 ENCOUNTER — Ambulatory Visit (INDEPENDENT_AMBULATORY_CARE_PROVIDER_SITE_OTHER): Payer: Medicare Other | Admitting: Ophthalmology

## 2017-01-10 ENCOUNTER — Ambulatory Visit: Payer: Medicare Other | Admitting: Physical Therapy

## 2017-01-10 DIAGNOSIS — R2689 Other abnormalities of gait and mobility: Secondary | ICD-10-CM | POA: Diagnosis not present

## 2017-01-10 DIAGNOSIS — M6281 Muscle weakness (generalized): Secondary | ICD-10-CM | POA: Diagnosis not present

## 2017-01-10 DIAGNOSIS — H353132 Nonexudative age-related macular degeneration, bilateral, intermediate dry stage: Secondary | ICD-10-CM | POA: Diagnosis not present

## 2017-01-10 DIAGNOSIS — R2681 Unsteadiness on feet: Secondary | ICD-10-CM | POA: Diagnosis not present

## 2017-01-10 DIAGNOSIS — H43813 Vitreous degeneration, bilateral: Secondary | ICD-10-CM | POA: Diagnosis not present

## 2017-01-10 DIAGNOSIS — H2513 Age-related nuclear cataract, bilateral: Secondary | ICD-10-CM

## 2017-01-10 LAB — HM DIABETES EYE EXAM

## 2017-01-11 NOTE — Therapy (Signed)
[facility] [address] [place], [place], [address] Phone: [phone]   Fax:  [phone]  Physical Therapy Treatment  Patient Details  Name: [name] MRN: [id number] Date of Birth: 03-11-1951 Referring Provider: Jacinta Shoe  Encounter Date: 01/10/2017      PT End of Session - 01/11/17 1128    Visit Number 5   Number of Visits 17   Date for PT Re-Evaluation 02/22/17   Authorization Type MCR primary, MCD secondary   PT Start Time 1317   PT Stop Time 1356   PT Time Calculation (min) 39 min   Equipment Utilized During Treatment Gait belt      Past Medical History:  Diagnosis Date  . Anoxic brain injury (HCC)   . Anxiety   . Chronic neck pain   . Chronic pain in shoulder   . COPD (chronic obstructive pulmonary disease) (HCC)   . Depression   . Fracture of left humerus 06/24/2014  . Gait disorder   . GERD (gastroesophageal reflux disease)   . Hernia of abdominal cavity   . 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 Hyperlipidemia   . LBP (low back pain)   . Neuropathy (HCC)   . Osteoarthritis   . Seizure disorder (HCC)   . Seizures (HCC)   . Type II or unspecified type diabetes mellitus without mention of complication, not stated as uncontrolled 2010  . Vitamin B 12 deficiency 2011    Past Surgical History:  Procedure Laterality Date  . COLONOSCOPY    . DIAGNOSTIC LAPAROSCOPY     PMH: Exploratory lap  . HERNIA REPAIR     incisional  . ORIF HUMERUS FRACTURE Left 06/24/2014   Procedure: LEFT OPEN REDUCTION INTERNAL FIXATION (ORIF) PROXIMAL HUMERUS FRACTURE;  Surgeon: Eulas Post, MD;  Location: [facility] OR;  Service: Orthopedics;  Laterality: Left;  . PANCREATECTOMY    . TONSILLECTOMY    . TUBAL LIGATION      There were no vitals filed for this visit.      Subjective Assessment - 01/11/17 1121    Subjective Pt reports she walks at home with RW with  caregiver's assistance   Patient is accompained by: --  caregiver - Aurther Loft   Pertinent History history of anoxic brain injury (approx 20 years ago), COPD   Patient Stated Goals "I want to walk."    Currently in Pain? No/denies                         [facility] Adult PT Treatment/Exercise - 01/11/17 0001      Transfers   Transfers Sit to Stand;Stand to Sit   Sit to Stand 5: Supervision   Sit to Stand Details Verbal cues for sequencing;Verbal cues for technique;Verbal cues for precautions/safety   Sit to Stand Details (indicate cue type and reason) no UE support used   Stand to Sit 5: Supervision   Stand to Sit Details (indicate cue type and reason) Verbal cues for sequencing;Verbal cues for technique;Verbal cues for precautions/safety     Ambulation/Gait   Ambulation/Gait Yes   Ambulation/Gait Assistance 4: Min guard   Ambulation/Gait Assistance Details Cues to "take big steps" for increased step length:  also cues to stay close inside RW and to stand erect as pt has increased trunk flexion    Ambulation Distance (Feet) 115 Feet  2 laps during PT session   Assistive device Rolling walker  Gait Pattern Step-to pattern;Decreased weight shift to left;Decreased stance time - left;Trunk flexed   Ambulation Surface Level;Indoor             Balance Exercises - 01/11/17 1127      Balance Exercises: Standing   Standing Eyes Opened Wide (BOA);Solid surface;1 rep;10 secs   Stepping Strategy Anterior;Posterior;5 reps   Sidestepping 2 reps;Other (comment)  bil. UE support on // bar   Other Standing Exercises pt performed tap ups to 4" step with bil., UE support             PT Short Term Goals - 12/24/16 1932      PT SHORT TERM GOAL #1   Title Patient with caregiver assistance will demonstrate understanding of initial HEP to improve flexibility and balance. (Target Date: 01/21/17)   Time 4   Period Weeks   Status New     PT SHORT TERM GOAL #2   Title Will  assess BERG and improve score by 3 points in order to indicate decreased fall risk.     Time 4   Period Weeks   Status New     PT SHORT TERM GOAL #3   Title Pt will improve 5TSS to <16 seconds with single UE support in order to indicate imrpoved functional strength.     Time 4   Period Weeks   Status New     PT SHORT TERM GOAL #4   Title Pt will improve TUG to <40 secs in order to indicate decreased fall risk.     Time 4   Period Weeks   Status New     PT SHORT TERM GOAL #5   Title Pt will ambulate x 200' w/ LRAD at S level over indoor surfaces in order to indicate safe home negotiation.    Time 4   Period Weeks   Status New           PT Long Term Goals - 12/24/16 1936      PT LONG TERM GOAL #1   Title Patient demonstrates with caregiver's cueing progressive HEP correctly. (Target Date: 02/18/17)   Time 8   Period Weeks   Status New     PT LONG TERM GOAL #2   Title Pt will improve BERG balance score by 6 points from baseline in order to indicate decreased fall risk.     Time 8   Period Weeks   Status New     PT LONG TERM GOAL #3   Title Pt will perform TUG <34 secs in order to indicate decreased fall risk.     Time 8   Period Weeks   Status New     PT LONG TERM GOAL #4   Title Pt will perform 8/10 sit<>stand without UE support in order to indicate improved functional strength.     Time 8   Period Weeks   Status New     PT LONG TERM GOAL #5   Title Pt will ambulate up to 350' over unlevel paved surfaces (including curb/ramp) at S level in order to indicate safe short community negotation.     Time 8   Period Weeks   Status New               Plan - 01/11/17 1129    Clinical Impression Statement Pt has much difficulty with negotiating transitional surfaces and turns in gait; pt also is very fearful of falling and has freezing episodes which increase in these situations.  Pt able to weight shift and initiate steps with counting "1,2, 3 step".                                                                         Rehab Potential Good   Clinical Impairments Affecting Rehab Potential co-morbidities, decreased progress in previous therapies   PT Frequency 2x / week   PT Duration 8 weeks   PT Treatment/Interventions ADLs/Self Care Home Management;DME Instruction;Gait training;Stair training;Functional mobility training;Therapeutic activities;Therapeutic exercise;Balance training;Neuromuscular re-education;Cognitive remediation;Patient/family education;Orthotic Fit/Training;Energy conservation;Vestibular   PT Next Visit Plan cont gait and balance   Consulted and Agree with Plan of Care Patient;Family member/caregiver   Family Member Consulted Caregiver [name]      Patient will benefit from skilled therapeutic intervention in order to improve the following deficits and impairments:  Abnormal gait, Cardiopulmonary status limiting activity, Decreased activity tolerance, Decreased balance, Decreased cognition, Decreased coordination, Decreased endurance, Decreased knowledge of use of DME, Decreased mobility, Decreased safety awareness, Decreased strength, Impaired perceived functional ability, Impaired flexibility, Impaired UE functional use, Postural dysfunction  Visit Diagnosis: Other abnormalities of gait and mobility  Unsteadiness on feet     Problem List Patient Active Problem List   Diagnosis Date Noted  . Irregular heart beat 11/15/2016  . Urinary tract infection without hematuria   . Severe sepsis (HCC) 11/08/2016  . Leukocytosis 07/31/2016  . Community acquired pneumonia   . Hyperglycemia   . Acute respiratory failure with hypoxia (HCC)   . Acute urinary retention   . Elevated troponin   . Sepsis (HCC) 07/18/2016  . Lobar pneumonia, unspecified organism (HCC) 07/18/2016  . Pharyngitis 07/18/2016  . Elevated troponin I level 07/18/2016  . Oral thrush 07/18/2016  . Odynophagia 07/18/2016  . Anoxic brain injury (HCC) 12/29/2015   . Gait disorder 12/28/2015  . Well adult exam 12/08/2014  . Fracture of left humerus 06/24/2014  . Humerus fracture 06/24/2014  . Chest wall contusion 05/03/2014  . Acute bronchitis 11/10/2013  . Left elbow pain 11/03/2013  . Right facial pain 06/19/2013  . Ankle ulcer (HCC) 04/02/2012  . Nausea & vomiting 10/05/2011  . Decubitus ulcer 06/26/2011  . Decubitus ulcer of coccyx 05/21/2011  . Cellulitis and abscess of other specified site 10/11/2010  . B12 deficiency 04/04/2010  . HYPERLIPIDEMIA 04/04/2010  . Anxiety state 11/14/2009  . Vitamin D deficiency 10/21/2009  . MELENA 10/21/2009  . DM2 (diabetes mellitus, type 2) (HCC) 07/07/2009  . FEVER, RECURRENT 06/21/2009  . COPD exacerbation (HCC) 02/25/2009  . OSTEOARTHRITIS 02/25/2009  . LOW BACK PAIN 02/25/2009  . TOBACCO USER 01/13/2009  . Rash and other nonspecific skin eruption 01/13/2009  . FURUNCLE 06/08/2008  . DEPRESSION 02/26/2007  . PAIN, CHRONIC NEC 02/26/2007  . Venous (peripheral) insufficiency 02/26/2007  . GERD 02/26/2007  . HERNIA, INCISIONAL 02/26/2007  . SHOULDER PAIN, LEFT 02/26/2007  . 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 INSOMNIA 02/26/2007  . Abnormality of gait 02/26/2007  . LEG EDEMA, CHRONIC 02/26/2007    Kary Kos, PT 01/11/2017, 11:33 AM  [facility] [address] [place], [place], [address] Phone: [phone]   Fax:  [phone]  Name: [name] MRN: [id number]  Date of Birth: Aug 04, 1951

## 2017-01-14 ENCOUNTER — Ambulatory Visit: Payer: Medicare Other | Admitting: Physical Therapy

## 2017-01-16 ENCOUNTER — Encounter: Payer: Self-pay | Admitting: Internal Medicine

## 2017-01-16 ENCOUNTER — Ambulatory Visit: Payer: Medicare Other | Admitting: Physical Therapy

## 2017-01-16 DIAGNOSIS — R2689 Other abnormalities of gait and mobility: Secondary | ICD-10-CM

## 2017-01-16 DIAGNOSIS — M6281 Muscle weakness (generalized): Secondary | ICD-10-CM | POA: Diagnosis not present

## 2017-01-16 DIAGNOSIS — R2681 Unsteadiness on feet: Secondary | ICD-10-CM | POA: Diagnosis not present

## 2017-01-16 NOTE — Therapy (Signed)
[facility] [address] [place], [place], [address] Phone: [phone]   Fax:  [phone]  Physical Therapy Treatment  Patient Details  Name: [name] MRN: [id number] Date of Birth: March 12, 1951 Referring Provider: Jacinta Shoe  Encounter Date: 01/16/2017      PT End of Session - 01/16/17 1558    Visit Number 6   Number of Visits 17   Date for PT Re-Evaluation 02/22/17   Authorization Type MCR primary, MCD secondary   PT Start Time 1450   PT Stop Time 1532   PT Time Calculation (min) 42 min      Past Medical History:  Diagnosis Date  . Anoxic brain injury (HCC)   . Anxiety   . Chronic neck pain   . Chronic pain in shoulder   . COPD (chronic obstructive pulmonary disease) (HCC)   . Depression   . Fracture of left humerus 06/24/2014  . Gait disorder   . GERD (gastroesophageal reflux disease)   . Hernia of abdominal cavity   . 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 Hyperlipidemia   . LBP (low back pain)   . Neuropathy   . Osteoarthritis   . Seizure disorder (HCC)   . Seizures (HCC)   . Type II or unspecified type diabetes mellitus without mention of complication, not stated as uncontrolled 2010  . Vitamin B 12 deficiency 2011    Past Surgical History:  Procedure Laterality Date  . COLONOSCOPY    . DIAGNOSTIC LAPAROSCOPY     PMH: Exploratory lap  . HERNIA REPAIR     incisional  . ORIF HUMERUS FRACTURE Left 06/24/2014   Procedure: LEFT OPEN REDUCTION INTERNAL FIXATION (ORIF) PROXIMAL HUMERUS FRACTURE;  Surgeon: Eulas Post, MD;  Location: [facility] OR;  Service: Orthopedics;  Laterality: Left;  . PANCREATECTOMY    . TONSILLECTOMY    . TUBAL LIGATION      There were no vitals filed for this visit.      Subjective Assessment - 01/16/17 1551    Subjective Pt reports she is walking in her home with the RW - states she does not use the wheelchair any more in  the house - states "I stopped that"   Pertinent History history of anoxic brain injury (approx 20 years ago), COPD   Limitations Walking;Standing   Patient Stated Goals "I want to walk."    Currently in Pain? No/denies                         [facility] Adult PT Treatment/Exercise - 01/16/17 0001      Transfers   Transfers Sit to Stand;Stand to Sit   Sit to Stand 5: Supervision   Sit to Stand Details Verbal cues for sequencing;Verbal cues for technique;Verbal cues for precautions/safety   Sit to Stand Details (indicate cue type and reason) no UE support    Stand to Sit 5: Supervision   Number of Reps Other reps (comment)  5 reps     Ambulation/Gait   Ambulation/Gait Yes   Ambulation/Gait Assistance 4: Min guard   Ambulation/Gait Assistance Details cues to increase step length and to stay close to RW   Ambulation Distance (Feet) 160 Feet  115 with RW 2nd rep   Assistive device Rolling walker   Gait Pattern Step-to pattern;Decreased weight shift to left;Decreased stance time - left;Trunk flexed   Ambulation Surface Level;Indoor     Knee/Hip Exercises:  Aerobic   Nustep level 3 x 10" with UE's and LE's      NeuroRe-ed:  SLS activities - touching balance bubbles with each foot with +2 mod hand held assist  Pt performed sidestepping 10' x 2 reps inside // bars with bil. UE support with cues to stand erect and to increase step length Stepping over orange hurdle inside // bars with bil. UE support - 10 reps each leg with cues to lift high for complete clearance Rockerboard inside // bars 10 reps with mod assist (pt fearful of falling) - with +2 UE support           PT Short Term Goals - 12/24/16 1932      PT SHORT TERM GOAL #1   Title Patient with caregiver assistance will demonstrate understanding of initial HEP to improve flexibility and balance. (Target Date: 01/21/17)   Time 4   Period Weeks   Status New     PT SHORT TERM GOAL #2   Title Will assess BERG  and improve score by 3 points in order to indicate decreased fall risk.     Time 4   Period Weeks   Status New     PT SHORT TERM GOAL #3   Title Pt will improve 5TSS to <16 seconds with single UE support in order to indicate imrpoved functional strength.     Time 4   Period Weeks   Status New     PT SHORT TERM GOAL #4   Title Pt will improve TUG to <40 secs in order to indicate decreased fall risk.     Time 4   Period Weeks   Status New     PT SHORT TERM GOAL #5   Title Pt will ambulate x 200' w/ LRAD at S level over indoor surfaces in order to indicate safe home negotiation.    Time 4   Period Weeks   Status New           PT Long Term Goals - 12/24/16 1936      PT LONG TERM GOAL #1   Title Patient demonstrates with caregiver's cueing progressive HEP correctly. (Target Date: 02/18/17)   Time 8   Period Weeks   Status New     PT LONG TERM GOAL #2   Title Pt will improve BERG balance score by 6 points from baseline in order to indicate decreased fall risk.     Time 8   Period Weeks   Status New     PT LONG TERM GOAL #3   Title Pt will perform TUG <34 secs in order to indicate decreased fall risk.     Time 8   Period Weeks   Status New     PT LONG TERM GOAL #4   Title Pt will perform 8/10 sit<>stand without UE support in order to indicate improved functional strength.     Time 8   Period Weeks   Status New     PT LONG TERM GOAL #5   Title Pt will ambulate up to 350' over unlevel paved surfaces (including curb/ramp) at S level in order to indicate safe short community negotation.     Time 8   Period Weeks   Status New               Plan - 01/16/17 1558    Clinical Impression Statement Pt had less freezing episodes today compared to performance in previous session last week.  Pt also  demonstrating improved balance with turning with use of RW.  Pt continues to need cues to stand erect.   Rehab Potential Good   Clinical Impairments Affecting Rehab  Potential co-morbidities, decreased progress in previous therapies   PT Frequency 2x / week   PT Duration 8 weeks   PT Treatment/Interventions ADLs/Self Care Home Management;DME Instruction;Gait training;Stair training;Functional mobility training;Therapeutic activities;Therapeutic exercise;Balance training;Neuromuscular re-education;Cognitive remediation;Patient/family education;Orthotic Fit/Training;Energy conservation;Vestibular   PT Next Visit Plan begin checking STG's - due date 01-21-17    Consulted and Agree with Plan of Care Patient;Family member/caregiver   Family Member Consulted Caregiver [name]      Patient will benefit from skilled therapeutic intervention in order to improve the following deficits and impairments:  Abnormal gait, Cardiopulmonary status limiting activity, Decreased activity tolerance, Decreased balance, Decreased cognition, Decreased coordination, Decreased endurance, Decreased knowledge of use of DME, Decreased mobility, Decreased safety awareness, Decreased strength, Impaired perceived functional ability, Impaired flexibility, Impaired UE functional use, Postural dysfunction  Visit Diagnosis: Other abnormalities of gait and mobility     Problem List Patient Active Problem List   Diagnosis Date Noted  . Irregular heart beat 11/15/2016  . Urinary tract infection without hematuria   . Severe sepsis (HCC) 11/08/2016  . Leukocytosis 07/31/2016  . Community acquired pneumonia   . Hyperglycemia   . Acute respiratory failure with hypoxia (HCC)   . Acute urinary retention   . Elevated troponin   . Sepsis (HCC) 07/18/2016  . Lobar pneumonia, unspecified organism (HCC) 07/18/2016  . Pharyngitis 07/18/2016  . Elevated troponin I level 07/18/2016  . Oral thrush 07/18/2016  . Odynophagia 07/18/2016  . Anoxic brain injury (HCC) 12/29/2015  . Gait disorder 12/28/2015  . Well adult exam 12/08/2014  . Fracture of left humerus 06/24/2014  . Humerus fracture  06/24/2014  . Chest wall contusion 05/03/2014  . Acute bronchitis 11/10/2013  . Left elbow pain 11/03/2013  . Right facial pain 06/19/2013  . Ankle ulcer (HCC) 04/02/2012  . Nausea & vomiting 10/05/2011  . Decubitus ulcer 06/26/2011  . Decubitus ulcer of coccyx 05/21/2011  . Cellulitis and abscess of other specified site 10/11/2010  . B12 deficiency 04/04/2010  . HYPERLIPIDEMIA 04/04/2010  . Anxiety state 11/14/2009  . Vitamin D deficiency 10/21/2009  . MELENA 10/21/2009  . DM2 (diabetes mellitus, type 2) (HCC) 07/07/2009  . FEVER, RECURRENT 06/21/2009  . COPD exacerbation (HCC) 02/25/2009  . OSTEOARTHRITIS 02/25/2009  . LOW BACK PAIN 02/25/2009  . TOBACCO USER 01/13/2009  . Rash and other nonspecific skin eruption 01/13/2009  . FURUNCLE 06/08/2008  . DEPRESSION 02/26/2007  . PAIN, CHRONIC NEC 02/26/2007  . Venous (peripheral) insufficiency 02/26/2007  . GERD 02/26/2007  . HERNIA, INCISIONAL 02/26/2007  . SHOULDER PAIN, LEFT 02/26/2007  . 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 INSOMNIA 02/26/2007  . Abnormality of gait 02/26/2007  . LEG EDEMA, CHRONIC 02/26/2007    Kary Kos, PT 01/16/2017, 4:03 PM  [facility] [address] [place], [place], [address] Phone: [phone]   Fax:  [phone]  Name: [name] MRN: [id number] Date of Birth: 03/14/1951

## 2017-01-17 ENCOUNTER — Ambulatory Visit: Payer: Medicare Other | Admitting: Rehabilitation

## 2017-01-17 ENCOUNTER — Encounter: Payer: Self-pay | Admitting: Rehabilitation

## 2017-01-17 DIAGNOSIS — M6281 Muscle weakness (generalized): Secondary | ICD-10-CM | POA: Diagnosis not present

## 2017-01-17 DIAGNOSIS — R2681 Unsteadiness on feet: Secondary | ICD-10-CM | POA: Diagnosis not present

## 2017-01-17 DIAGNOSIS — R2689 Other abnormalities of gait and mobility: Secondary | ICD-10-CM | POA: Diagnosis not present

## 2017-01-17 NOTE — Therapy (Signed)
[facility] [address] [place], [place], [address] Phone: [phone]   Fax:  [phone]  Physical Therapy Treatment  Patient Details  Name: [name] MRN: [id number] Date of Birth: 06/04/1951 Referring Provider: Lew Dawes  Encounter Date: 01/17/2017      PT End of Session - 01/17/17 1655    Visit Number 7   Number of Visits 17   Date for PT Re-Evaluation 02/22/17   Authorization Type MCR primary, MCD secondary   PT Start Time 1446   PT Stop Time 1530   PT Time Calculation (min) 44 min   Activity Tolerance Patient tolerated treatment well   Behavior During Therapy [facility] for tasks assessed/performed      Past Medical History:  Diagnosis Date  . Anoxic brain injury ([place])   . Anxiety   . Chronic neck pain   . Chronic pain in shoulder   . COPD (chronic obstructive pulmonary disease) ([place])   . Depression   . Fracture of left humerus 06/24/2014  . Gait disorder   . GERD (gastroesophageal reflux disease)   . Hernia of abdominal cavity   . 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 Hyperlipidemia   . LBP (low back pain)   . Neuropathy   . Osteoarthritis   . Seizure disorder ([place])   . Seizures ([place])   . Type II or unspecified type diabetes mellitus without mention of complication, not stated as uncontrolled 2010  . Vitamin B 12 deficiency 2011    Past Surgical History:  Procedure Laterality Date  . COLONOSCOPY    . DIAGNOSTIC LAPAROSCOPY     PMH: Exploratory lap  . HERNIA REPAIR     incisional  . ORIF HUMERUS FRACTURE Left 06/24/2014   Procedure: LEFT OPEN REDUCTION INTERNAL FIXATION (ORIF) PROXIMAL HUMERUS FRACTURE;  Surgeon: Johnny Bridge, MD;  Location: [facility];  Service: Orthopedics;  Laterality: Left;  . PANCREATECTOMY    . TONSILLECTOMY    . TUBAL LIGATION      There were no vitals filed for this visit.      Subjective Assessment - 01/17/17 1451     Subjective Pt reports continued walking at home, still not using w/c.    Pertinent History history of anoxic brain injury (approx 20 years ago), COPD   Limitations Walking;Standing   Patient Stated Goals "I want to walk."    Currently in Pain? No/denies                         [facility] Adult PT Treatment/Exercise - 01/17/17 0001      Transfers   Transfers Sit to Stand;Stand to Sit   Sit to Stand 6: Modified independent (Device/Increase time)   Sit to Stand Details (indicate cue type and reason) single UE support for 5TSS   Five time sit to stand comments  13.69 secs with single UE support, pt unable to perform without UE support and without using backs of legs to elevate.    Stand to Sit 6: Modified independent (Device/Increase time)     Ambulation/Gait   Ambulation/Gait Yes   Ambulation/Gait Assistance 5: Supervision   Ambulation/Gait Assistance Details Performed gait x 125' with RW.  note MARKED improvement in pts ability to maintain large step lengths although she does tend to maintain slightly forward flexed posture.  Also note gait speed is much improved due to increase step length.     Ambulation Distance (  Feet) 125 Feet   Assistive device Rolling walker   Gait Pattern Step-to pattern;Decreased weight shift to left;Decreased stance time - left;Trunk flexed   Ambulation Surface Level;Indoor     Standardized Balance Assessment   Standardized Balance Assessment Timed Up and Go Test     Timed Up and Go Test   TUG Normal TUG   Normal TUG (seconds) 42.9  with RW     Neuro Re-ed    Neuro Re-ed Details  Performed tapping to cones with use of RW with emphasis on imroved L lateral weight shift.  Pt able to perform with constant cues x 10 reps, had pt ambulate through agility ladder to work on improved stride length.  Performed x 2 reps with mod A for RW placement and constant cues for posture.                  PT Education - 01/17/17 1654    Education provided  Yes   Education Details walking into clinic with RW   Person(s) Educated Patient;Caregiver(s)   Methods Explanation   Comprehension Verbalized understanding          PT Short Term Goals - 01/17/17 1522      PT SHORT TERM GOAL #1   Title Patient with caregiver assistance will demonstrate understanding of initial HEP to improve flexibility and balance. (Target Date: 01/21/17)   Time 4   Period Weeks   Status New     PT SHORT TERM GOAL #2   Title Will assess BERG and improve score by 3 points in order to indicate decreased fall risk.     Time 4   Period Weeks   Status New     PT SHORT TERM GOAL #3   Title Pt will improve 5TSS to <16 seconds with single UE support in order to indicate imrpoved functional strength.     Baseline 13.69 secs with single UE support.    Time 4   Period Weeks   Status Achieved     PT SHORT TERM GOAL #4   Title Pt will improve TUG to <40 secs in order to indicate decreased fall risk.     Baseline 42.90 secs with RW   Time 4   Period Weeks   Status Partially Met     PT SHORT TERM GOAL #5   Title Pt will ambulate x 200' w/ LRAD at S level over indoor surfaces in order to indicate safe home negotiation.    Time 4   Period Weeks   Status New           PT Long Term Goals - 12/24/16 1936      PT LONG TERM GOAL #1   Title Patient demonstrates with caregiver's cueing progressive HEP correctly. (Target Date: 02/18/17)   Time 8   Period Weeks   Status New     PT LONG TERM GOAL #2   Title Pt will improve BERG balance score by 6 points from baseline in order to indicate decreased fall risk.     Time 8   Period Weeks   Status New     PT LONG TERM GOAL #3   Title Pt will perform TUG <34 secs in order to indicate decreased fall risk.     Time 8   Period Weeks   Status New     PT LONG TERM GOAL #4   Title Pt will perform 8/10 sit<>stand without UE support in order to indicate improved functional strength.  Time 8   Period Weeks   Status  New     PT LONG TERM GOAL #5   Title Pt will ambulate up to 350' over unlevel paved surfaces (including curb/ramp) at S level in order to indicate safe short community negotation.     Time 8   Period Weeks   Status New               Plan - 01/17/17 1655    Clinical Impression Statement Pt made marked improvement in ability to ambulate with increased stride length, despite forward flexed posture.  Continue to address this with NMR tasks.  Also addressed 5TSS and TUG goal.  She has met 5TSS goal and made progress on TUG, but did not meet this goal.  Will plan to check remaining goals on next session.    Rehab Potential Good   Clinical Impairments Affecting Rehab Potential co-morbidities, decreased progress in previous therapies   PT Frequency 2x / week   PT Duration 8 weeks   PT Treatment/Interventions ADLs/Self Care Home Management;DME Instruction;Gait training;Stair training;Functional mobility training;Therapeutic activities;Therapeutic exercise;Balance training;Neuromuscular re-education;Cognitive remediation;Patient/family education;Orthotic Fit/Training;Energy conservation;Vestibular   PT Next Visit Plan check remaining STGs.    Consulted and Agree with Plan of Care Patient;Family member/caregiver   Family Member Consulted Caregiver [name]      Patient will benefit from skilled therapeutic intervention in order to improve the following deficits and impairments:  Abnormal gait, Cardiopulmonary status limiting activity, Decreased activity tolerance, Decreased balance, Decreased cognition, Decreased coordination, Decreased endurance, Decreased knowledge of use of DME, Decreased mobility, Decreased safety awareness, Decreased strength, Impaired perceived functional ability, Impaired flexibility, Impaired UE functional use, Postural dysfunction  Visit Diagnosis: Other abnormalities of gait and mobility  Unsteadiness on feet  Muscle weakness (generalized)     Problem  List Patient Active Problem List   Diagnosis Date Noted  . Irregular heart beat 11/15/2016  . Urinary tract infection without hematuria   . Severe sepsis ([place]) 11/08/2016  . Leukocytosis 07/31/2016  . Community acquired pneumonia   . Hyperglycemia   . Acute respiratory failure with hypoxia ([place])   . Acute urinary retention   . Elevated troponin   . Sepsis ([place]) 07/18/2016  . Lobar pneumonia, unspecified organism ([place]) 07/18/2016  . Pharyngitis 07/18/2016  . Elevated troponin I level 07/18/2016  . Oral thrush 07/18/2016  . Odynophagia 07/18/2016  . Anoxic brain injury ([place]) 12/29/2015  . Gait disorder 12/28/2015  . Well adult exam 12/08/2014  . Fracture of left humerus 06/24/2014  . Humerus fracture 06/24/2014  . Chest wall contusion 05/03/2014  . Acute bronchitis 11/10/2013  . Left elbow pain 11/03/2013  . Right facial pain 06/19/2013  . Ankle ulcer ([place]) 04/02/2012  . Nausea & vomiting 10/05/2011  . Decubitus ulcer 06/26/2011  . Decubitus ulcer of coccyx 05/21/2011  . Cellulitis and abscess of other specified site 10/11/2010  . B12 deficiency 04/04/2010  . HYPERLIPIDEMIA 04/04/2010  . Anxiety state 11/14/2009  . Vitamin D deficiency 10/21/2009  . MELENA 10/21/2009  . DM2 (diabetes mellitus, type 2) ([place]) 07/07/2009  . FEVER, RECURRENT 06/21/2009  . COPD exacerbation ([place]) 02/25/2009  . OSTEOARTHRITIS 02/25/2009  . LOW BACK PAIN 02/25/2009  . TOBACCO USER 01/13/2009  . Rash and other nonspecific skin eruption 01/13/2009  . FURUNCLE 06/08/2008  . DEPRESSION 02/26/2007  . PAIN, CHRONIC NEC 02/26/2007  . Venous (peripheral) insufficiency 02/26/2007  . GERD 02/26/2007  . HERNIA, INCISIONAL 02/26/2007  . SHOULDER PAIN, LEFT 02/26/2007  . Seizure  as late effect of cerebrovascular accident (CVA) ([place]) 02/26/2007  . INSOMNIA 02/26/2007  . Abnormality of gait 02/26/2007  . LEG EDEMA, CHRONIC 02/26/2007   Cameron Sprang, PT, MPT [facility] [address] [place], [place], [address] Phone: [phone]   Fax:  [phone] 01/17/17, 5:00 PM  Name: [name] MRN: [id number] Date of Birth: January 05, 1951

## 2017-01-18 DIAGNOSIS — G894 Chronic pain syndrome: Secondary | ICD-10-CM | POA: Diagnosis not present

## 2017-01-18 DIAGNOSIS — Z79891 Long term (current) use of opiate analgesic: Secondary | ICD-10-CM | POA: Diagnosis not present

## 2017-01-18 DIAGNOSIS — M47816 Spondylosis without myelopathy or radiculopathy, lumbar region: Secondary | ICD-10-CM | POA: Diagnosis not present

## 2017-01-21 ENCOUNTER — Ambulatory Visit: Payer: Medicare Other | Admitting: Physical Therapy

## 2017-01-21 DIAGNOSIS — R2689 Other abnormalities of gait and mobility: Secondary | ICD-10-CM | POA: Diagnosis not present

## 2017-01-21 DIAGNOSIS — R2681 Unsteadiness on feet: Secondary | ICD-10-CM

## 2017-01-21 DIAGNOSIS — M6281 Muscle weakness (generalized): Secondary | ICD-10-CM | POA: Diagnosis not present

## 2017-01-22 NOTE — Therapy (Signed)
[facility] [address] [place], [place], [address] Phone: [phone]   Fax:  [phone]  Physical Therapy Treatment  Patient Details  Name: [name] MRN: [id number] Date of Birth: 12/17/1950 Referring Provider: Plotnikov, Aleksei  Encounter Date: 01/21/2017      PT End of Session - 01/22/17 0958    Visit Number 8  G8   Number of Visits 17   Date for PT Re-Evaluation 02/22/17   Authorization Type MCR primary, MCD secondary   PT Start Time 1450   PT Stop Time 1532   PT Time Calculation (min) 42 min   Equipment Utilized During Treatment Gait belt      Past Medical History:  Diagnosis Date  . Anoxic brain injury (HCC)   . Anxiety   . Chronic neck pain   . Chronic pain in shoulder   . COPD (chronic obstructive pulmonary disease) (HCC)   . Depression   . Fracture of left humerus 06/24/2014  . Gait disorder   . GERD (gastroesophageal reflux disease)   . Hernia of abdominal cavity   . 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 Hyperlipidemia   . LBP (low back pain)   . Neuropathy   . Osteoarthritis   . Seizure disorder (HCC)   . Seizures (HCC)   . Type II or unspecified type diabetes mellitus without mention of complication, not stated as uncontrolled 2010  . Vitamin B 12 deficiency 2011    Past Surgical History:  Procedure Laterality Date  . COLONOSCOPY    . DIAGNOSTIC LAPAROSCOPY     PMH: Exploratory lap  . HERNIA REPAIR     incisional  . ORIF HUMERUS FRACTURE Left 06/24/2014   Procedure: LEFT OPEN REDUCTION INTERNAL FIXATION (ORIF) PROXIMAL HUMERUS FRACTURE;  Surgeon: Joshua P Landau, MD;  Location: [facility];  Service: Orthopedics;  Laterality: Left;  . PANCREATECTOMY    . TONSILLECTOMY    . TUBAL LIGATION      There were no vitals filed for this visit.      Subjective Assessment - 01/22/17 0936    Subjective Pt reports no changes and no falls:  continues to  walk at home with RW with assistance   Patient is accompained by: --  caregiver Terry   Pertinent History history of anoxic brain injury (approx 20 years ago), COPD   Limitations Walking;Standing   Patient Stated Goals "I want to walk."    Currently in Pain? No/denies                         [facility] Adult PT Treatment/Exercise - 01/22/17 0001      Transfers   Transfers Sit to Stand;Stand to Sit   Sit to Stand 5: Supervision   Sit to Stand Details (indicate cue type and reason) RUE support used for sit to stand from hi/lo mat table   Number of Reps Other reps (comment)  5 reps     Ambulation/Gait   Ambulation/Gait Yes   Ambulation/Gait Assistance 5: Supervision   Ambulation/Gait Assistance Details cues to increase step length and to increase weight shift onto LLE   Ambulation Distance (Feet) 115 Feet   Assistive device Rolling walker   Gait Pattern Step-to pattern;Decreased weight shift to left;Decreased stance time - left;Trunk flexed   Ambulation Surface Level;Indoor     Neuro Re-ed    Neuro Re-ed Details  Pt performed SLS activity of tapping balance   bubbles to increase weight shift onto LLE     Knee/Hip Exercises: Aerobic   Elliptical 2" level 1.5 with 1 rest period after 1 1/2"   Nustep Level 4 x 5" with UE's and LE's                  PT Short Term Goals - 01/22/17 1000      PT SHORT TERM GOAL #1   Title Patient with caregiver assistance will demonstrate understanding of initial HEP to improve flexibility and balance. (Target Date: 01/21/17)   Baseline met 01-21-17 -- pt reports she is doing HEP at home   Status Achieved     PT [place] #2   Title Will assess BERG and improve score by 3 points in order to indicate decreased fall risk.     Status New     PT SHORT TERM GOAL #3   Title Pt will improve 5TSS to <16 seconds with single UE support in order to indicate imrpoved functional strength.     Baseline 13.69 secs with single UE  support.    Status Achieved     PT SHORT TERM GOAL #4   Title Pt will improve TUG to <40 secs in order to indicate decreased fall risk.     Status Partially Met     PT SHORT TERM GOAL #5   Title Pt will ambulate x 200' w/ LRAD at S level over indoor surfaces in order to indicate safe home negotiation.    Baseline Pt amb. 115' with RW - reports fatigue after amb. this distance and requests to sit down  - 01-21-17   Time 4   Period Weeks   Status On-going           PT Long Term Goals - 12/24/16 1936      PT LONG TERM GOAL #1   Title Patient demonstrates with caregiver's cueing progressive HEP correctly. (Target Date: 02/18/17)   Time 8   Period Weeks   Status New     PT LONG TERM GOAL #2   Title Pt will improve BERG balance score by 6 points from baseline in order to indicate decreased fall risk.     Time 8   Period Weeks   Status New     PT LONG TERM GOAL #3   Title Pt will perform TUG <34 secs in order to indicate decreased fall risk.     Time 8   Period Weeks   Status New     PT LONG TERM GOAL #4   Title Pt will perform 8/10 sit<>stand without UE support in order to indicate improved functional strength.     Time 8   Period Weeks   Status New     PT LONG TERM GOAL #5   Title Pt will ambulate up to 350' over unlevel paved surfaces (including curb/ramp) at S level in order to indicate safe short community negotation.     Time 8   Period Weeks   Status New               Plan - 01/22/17 1003    Clinical Impression Statement Pt has met STG #1; #2 not tested: #5 not met due to fatigue reported after amb. 115' (1 lap around track).  Pt is demonstrating increased step length with amb. but continues to have postural abnormalities of increased trunk flexion and decr. weight shift onto LLE.   Rehab Potential Good   Clinical Impairments Affecting  Rehab Potential co-morbidities, decreased progress in previous therapies   PT Frequency 2x / week   PT Duration 8 weeks    PT Treatment/Interventions ADLs/Self Care Home Management;DME Instruction;Gait training;Stair training;Functional mobility training;Therapeutic activities;Therapeutic exercise;Balance training;Neuromuscular re-education;Cognitive remediation;Patient/family education;Orthotic Fit/Training;Energy conservation;Vestibular   PT Next Visit Plan check STG #2 - other STG's assessed   Consulted and Agree with Plan of Care Patient;Family member/caregiver   Family Member Consulted Caregiver [name]      Patient will benefit from skilled therapeutic intervention in order to improve the following deficits and impairments:  Abnormal gait, Cardiopulmonary status limiting activity, Decreased activity tolerance, Decreased balance, Decreased cognition, Decreased coordination, Decreased endurance, Decreased knowledge of use of DME, Decreased mobility, Decreased safety awareness, Decreased strength, Impaired perceived functional ability, Impaired flexibility, Impaired UE functional use, Postural dysfunction  Visit Diagnosis: Other abnormalities of gait and mobility  Unsteadiness on feet     Problem List Patient Active Problem List   Diagnosis Date Noted  . Irregular heart beat 11/15/2016  . Urinary tract infection without hematuria   . Severe sepsis ([place]) 11/08/2016  . Leukocytosis 07/31/2016  . Community acquired pneumonia   . Hyperglycemia   . Acute respiratory failure with hypoxia ([place])   . Acute urinary retention   . Elevated troponin   . Sepsis ([place]) 07/18/2016  . Lobar pneumonia, unspecified organism ([place]) 07/18/2016  . Pharyngitis 07/18/2016  . Elevated troponin I level 07/18/2016  . Oral thrush 07/18/2016  . Odynophagia 07/18/2016  . Anoxic brain injury ([place]) 12/29/2015  . Gait disorder 12/28/2015  . Well adult exam 12/08/2014  . Fracture of left humerus 06/24/2014  . Humerus fracture 06/24/2014  . Chest wall contusion 05/03/2014  . Acute bronchitis 11/10/2013  . Left elbow pain  11/03/2013  . Right facial pain 06/19/2013  . Ankle ulcer ([place]) 04/02/2012  . Nausea & vomiting 10/05/2011  . Decubitus ulcer 06/26/2011  . Decubitus ulcer of coccyx 05/21/2011  . Cellulitis and abscess of other specified site 10/11/2010  . B12 deficiency 04/04/2010  . HYPERLIPIDEMIA 04/04/2010  . Anxiety state 11/14/2009  . Vitamin D deficiency 10/21/2009  . MELENA 10/21/2009  . DM2 (diabetes mellitus, type 2) ([place]) 07/07/2009  . FEVER, RECURRENT 06/21/2009  . COPD exacerbation ([place]) 02/25/2009  . OSTEOARTHRITIS 02/25/2009  . LOW BACK PAIN 02/25/2009  . TOBACCO USER 01/13/2009  . Rash and other nonspecific skin eruption 01/13/2009  . FURUNCLE 06/08/2008  . DEPRESSION 02/26/2007  . PAIN, CHRONIC NEC 02/26/2007  . Venous (peripheral) insufficiency 02/26/2007  . GERD 02/26/2007  . HERNIA, INCISIONAL 02/26/2007  . SHOULDER PAIN, LEFT 02/26/2007  . 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 INSOMNIA 02/26/2007  . Abnormality of gait 02/26/2007  . LEG EDEMA, CHRONIC 02/26/2007    Alda Lea, PT 01/22/2017, 10:10 AM  [facility] [address] [place], [place], [address] Phone: [phone]   Fax:  [phone]  Name: [name] MRN: [id number] Date of Birth: 1950-12-10

## 2017-01-24 ENCOUNTER — Ambulatory Visit: Payer: Medicare Other | Admitting: Rehabilitation

## 2017-01-24 ENCOUNTER — Encounter: Payer: Self-pay | Admitting: Rehabilitation

## 2017-01-24 DIAGNOSIS — R2681 Unsteadiness on feet: Secondary | ICD-10-CM

## 2017-01-24 DIAGNOSIS — M6281 Muscle weakness (generalized): Secondary | ICD-10-CM

## 2017-01-24 DIAGNOSIS — R2689 Other abnormalities of gait and mobility: Secondary | ICD-10-CM | POA: Diagnosis not present

## 2017-01-24 NOTE — Therapy (Signed)
[facility] [address] [place], [place], [address] Phone: [phone]   Fax:  [phone]  Physical Therapy Treatment  Patient Details  Name: [name] MRN: [id number] Date of Birth: 1951/02/08 Referring Provider: Lew Dawes  Encounter Date: 01/24/2017      PT End of Session - 01/24/17 1628    Visit Number 9  G8   Number of Visits 17   Date for PT Re-Evaluation 02/22/17   Authorization Type MCR primary, MCD secondary   PT Start Time 1447   PT Stop Time 1530   PT Time Calculation (min) 43 min   Activity Tolerance Patient tolerated treatment well   Behavior During Therapy [facility] for tasks assessed/performed      Past Medical History:  Diagnosis Date  . Anoxic brain injury ([place])   . Anxiety   . Chronic neck pain   . Chronic pain in shoulder   . COPD (chronic obstructive pulmonary disease) ([place])   . Depression   . Fracture of left humerus 06/24/2014  . Gait disorder   . GERD (gastroesophageal reflux disease)   . Hernia of abdominal cavity   . 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 Hyperlipidemia   . LBP (low back pain)   . Neuropathy   . Osteoarthritis   . Seizure disorder ([place])   . Seizures ([place])   . Type II or unspecified type diabetes mellitus without mention of complication, not stated as uncontrolled 2010  . Vitamin B 12 deficiency 2011    Past Surgical History:  Procedure Laterality Date  . COLONOSCOPY    . DIAGNOSTIC LAPAROSCOPY     PMH: Exploratory lap  . HERNIA REPAIR     incisional  . ORIF HUMERUS FRACTURE Left 06/24/2014   Procedure: LEFT OPEN REDUCTION INTERNAL FIXATION (ORIF) PROXIMAL HUMERUS FRACTURE;  Surgeon: Johnny Bridge, MD;  Location: [facility];  Service: Orthopedics;  Laterality: Left;  . PANCREATECTOMY    . TONSILLECTOMY    . TUBAL LIGATION      There were no vitals filed for this visit.      Subjective Assessment - 01/24/17  1453    Subjective Pt reports no big changes, no falls.  Non-ambulatory into clinic.    Pertinent History history of anoxic brain injury (approx 20 years ago), COPD   Limitations Walking;Standing   Patient Stated Goals "I want to walk."    Currently in Pain? Yes   Pain Score 6   this is normal chronic pain for pt   Pain Location Back   Pain Orientation Lower   Pain Descriptors / Indicators Aching   Pain Type Chronic pain   Pain Onset More than a month ago   Pain Frequency Constant   Aggravating Factors  standing   Pain Relieving Factors medication                         [facility] Adult PT Treatment/Exercise - 01/24/17 1455      Ambulation/Gait   Ambulation/Gait Yes   Ambulation/Gait Assistance 5: Supervision   Ambulation/Gait Assistance Details Continue to work on gait following NMR task in // bars for improved L lateral weight shift and increased R step length.  Had pt utilize mirror in order to better understand L weight shift and postural control during stepping.  Does well during task, however demonstrates lack of ability to carryover to gait with RW.    Ambulation Distance (  Feet) 115 Feet  with seated rest break   Assistive device Rolling walker   Gait Pattern Step-to pattern;Decreased weight shift to left;Decreased stance time - left;Trunk flexed   Ambulation Surface Level;Indoor     Standardized Balance Assessment   Standardized Balance Assessment Berg Balance Test     Berg Balance Test   Sit to Stand Able to stand  independently using hands   Standing Unsupported Able to stand safely 2 minutes   Sitting with Back Unsupported but Feet Supported on Floor or Stool Able to sit safely and securely 2 minutes   Stand to Sit Sits safely with minimal use of hands   Transfers Able to transfer with verbal cueing and /or supervision   Standing Unsupported with Eyes Closed Able to stand 10 seconds with supervision   Standing Ubsupported with Feet Together Needs help  to attain position and unable to hold for 15 seconds   From Standing, Reach Forward with Outstretched Arm Can reach forward >5 cm safely (2")   From Standing Position, Pick up Object from Floor Able to pick up shoe, needs supervision   From Standing Position, Turn to Look Behind Over each Shoulder Looks behind one side only/other side shows less weight shift   Turn 360 Degrees Needs assistance while turning   Standing Unsupported, Alternately Place Feet on Step/Stool Needs assistance to keep from falling or unable to try   Standing Unsupported, One Foot in Front Needs help to step but can hold 15 seconds   Standing on One Leg Tries to lift leg/unable to hold 3 seconds but remains standing independently   Total Score 30     Neuro Re-ed    Neuro Re-ed Details  In // bars had pt work on stepping forward and back over small black balance beam with BUE support.  Utilized Geologist, engineering with good return demonstration on improved posture and L lateral weight shift, however does not carryover to gait.  Performed x 10 reps                 PT Education - 01/24/17 1628    Education provided Yes   Education Details BERG results   Person(s) Educated Patient;Caregiver(s)   Methods Explanation   Comprehension Verbalized understanding          PT Short Term Goals - 01/24/17 1512      PT SHORT TERM GOAL #1   Title Patient with caregiver assistance will demonstrate understanding of initial HEP to improve flexibility and balance. (Target Date: 01/21/17)   Baseline met 01-21-17 -- pt reports she is doing HEP at home   Status Achieved     PT [place] #2   Title Will assess BERG and improve score by 3 points in order to indicate decreased fall risk.     Baseline 22/56 baseline and 30/56 on 01/24/17   Status Achieved     PT SHORT TERM GOAL #3   Title Pt will improve 5TSS to <16 seconds with single UE support in order to indicate imrpoved functional strength.     Baseline 13.69 secs with single  UE support.    Status Achieved     PT SHORT TERM GOAL #4   Title Pt will improve TUG to <40 secs in order to indicate decreased fall risk.     Status Partially Met     PT SHORT TERM GOAL #5   Title Pt will ambulate x 200' w/ LRAD at S level over indoor surfaces in order  to indicate safe home negotiation.    Baseline Pt amb. 115' with RW - reports fatigue after amb. this distance and requests to sit down  - 01-21-17   Time 4   Period Weeks   Status On-going           PT Long Term Goals - 12/24/16 1936      PT LONG TERM GOAL #1   Title Patient demonstrates with caregiver's cueing progressive HEP correctly. (Target Date: 02/18/17)   Time 8   Period Weeks   Status New     PT LONG TERM GOAL #2   Title Pt will improve BERG balance score by 6 points from baseline in order to indicate decreased fall risk.     Time 8   Period Weeks   Status New     PT LONG TERM GOAL #3   Title Pt will perform TUG <34 secs in order to indicate decreased fall risk.     Time 8   Period Weeks   Status New     PT LONG TERM GOAL #4   Title Pt will perform 8/10 sit<>stand without UE support in order to indicate improved functional strength.     Time 8   Period Weeks   Status New     PT LONG TERM GOAL #5   Title Pt will ambulate up to 350' over unlevel paved surfaces (including curb/ramp) at S level in order to indicate safe short community negotation.     Time 8   Period Weeks   Status New               Plan - 01/24/17 1629    Clinical Impression Statement Skilled session addressed BERG balance test for STG.  Note she met goal with 8 point improvement, however it doesn't seem as though these improvements have carried over to gait improvements and still recommend S for gait with RW.     Rehab Potential Good   Clinical Impairments Affecting Rehab Potential co-morbidities, decreased progress in previous therapies   PT Frequency 2x / week   PT Duration 8 weeks   PT Treatment/Interventions  ADLs/Self Care Home Management;DME Instruction;Gait training;Stair training;Functional mobility training;Therapeutic activities;Therapeutic exercise;Balance training;Neuromuscular re-education;Cognitive remediation;Patient/family education;Orthotic Fit/Training;Energy conservation;Vestibular   PT Next Visit Plan GCODE/PN!!! Continue to work on tasks for L weight shift, increased R step length, gait with RW for improved quality and endurance.    Consulted and Agree with Plan of Care Patient;Family member/caregiver   Family Member Consulted Caregiver [name]      Patient will benefit from skilled therapeutic intervention in order to improve the following deficits and impairments:  Abnormal gait, Cardiopulmonary status limiting activity, Decreased activity tolerance, Decreased balance, Decreased cognition, Decreased coordination, Decreased endurance, Decreased knowledge of use of DME, Decreased mobility, Decreased safety awareness, Decreased strength, Impaired perceived functional ability, Impaired flexibility, Impaired UE functional use, Postural dysfunction  Visit Diagnosis: Other abnormalities of gait and mobility  Unsteadiness on feet  Muscle weakness (generalized)     Problem List Patient Active Problem List   Diagnosis Date Noted  . Irregular heart beat 11/15/2016  . Urinary tract infection without hematuria   . Severe sepsis ([place]) 11/08/2016  . Leukocytosis 07/31/2016  . Community acquired pneumonia   . Hyperglycemia   . Acute respiratory failure with hypoxia ([place])   . Acute urinary retention   . Elevated troponin   . Sepsis ([place]) 07/18/2016  . Lobar pneumonia, unspecified organism ([place]) 07/18/2016  . Pharyngitis 07/18/2016  .  Elevated troponin I level 07/18/2016  . Oral thrush 07/18/2016  . Odynophagia 07/18/2016  . Anoxic brain injury ([place]) 12/29/2015  . Gait disorder 12/28/2015  . Well adult exam 12/08/2014  . Fracture of left humerus 06/24/2014  . Humerus fracture  06/24/2014  . Chest wall contusion 05/03/2014  . Acute bronchitis 11/10/2013  . Left elbow pain 11/03/2013  . Right facial pain 06/19/2013  . Ankle ulcer ([place]) 04/02/2012  . Nausea & vomiting 10/05/2011  . Decubitus ulcer 06/26/2011  . Decubitus ulcer of coccyx 05/21/2011  . Cellulitis and abscess of other specified site 10/11/2010  . B12 deficiency 04/04/2010  . HYPERLIPIDEMIA 04/04/2010  . Anxiety state 11/14/2009  . Vitamin D deficiency 10/21/2009  . MELENA 10/21/2009  . DM2 (diabetes mellitus, type 2) ([place]) 07/07/2009  . FEVER, RECURRENT 06/21/2009  . COPD exacerbation ([place]) 02/25/2009  . OSTEOARTHRITIS 02/25/2009  . LOW BACK PAIN 02/25/2009  . TOBACCO USER 01/13/2009  . Rash and other nonspecific skin eruption 01/13/2009  . FURUNCLE 06/08/2008  . DEPRESSION 02/26/2007  . PAIN, CHRONIC NEC 02/26/2007  . Venous (peripheral) insufficiency 02/26/2007  . GERD 02/26/2007  . HERNIA, INCISIONAL 02/26/2007  . SHOULDER PAIN, LEFT 02/26/2007  . 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 INSOMNIA 02/26/2007  . Abnormality of gait 02/26/2007  . LEG EDEMA, CHRONIC 02/26/2007    Cameron Sprang, PT, MPT [facility] [address] [place], [place], [address] Phone: [phone]   Fax:  [phone] 01/24/17, 4:37 PM  Name: [name] MRN: [id number] Date of Birth: [birth year]

## 2017-01-28 ENCOUNTER — Ambulatory Visit: Payer: Medicare Other | Admitting: Physical Therapy

## 2017-01-28 DIAGNOSIS — R2681 Unsteadiness on feet: Secondary | ICD-10-CM | POA: Diagnosis not present

## 2017-01-28 DIAGNOSIS — R2689 Other abnormalities of gait and mobility: Secondary | ICD-10-CM | POA: Diagnosis not present

## 2017-01-28 DIAGNOSIS — M6281 Muscle weakness (generalized): Secondary | ICD-10-CM | POA: Diagnosis not present

## 2017-01-28 NOTE — Therapy (Signed)
[facility] [address] [place], [place], [address] Phone: [phone]   Fax:  [phone]  Physical Therapy Treatment  Patient Details  Name: [name] MRN: [id number] Date of Birth: Dec 16, 1950 Referring Provider: Lew Dawes  Encounter Date: 01/28/2017      PT End of Session - 01/28/17 2132    Visit Number 10   Number of Visits 17   Date for PT Re-Evaluation 02/22/17   Authorization Type MCR primary, MCD secondary   PT Start Time 1536   PT Stop Time 1620   PT Time Calculation (min) 44 min      Past Medical History:  Diagnosis Date  . Anoxic brain injury ([place])   . Anxiety   . Chronic neck pain   . Chronic pain in shoulder   . COPD (chronic obstructive pulmonary disease) ([place])   . Depression   . Fracture of left humerus 06/24/2014  . Gait disorder   . GERD (gastroesophageal reflux disease)   . Hernia of abdominal cavity   . 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 Hyperlipidemia   . LBP (low back pain)   . Neuropathy   . Osteoarthritis   . Seizure disorder ([place])   . Seizures ([place])   . Type II or unspecified type diabetes mellitus without mention of complication, not stated as uncontrolled 2010  . Vitamin B 12 deficiency 2011    Past Surgical History:  Procedure Laterality Date  . COLONOSCOPY    . DIAGNOSTIC LAPAROSCOPY     PMH: Exploratory lap  . HERNIA REPAIR     incisional  . ORIF HUMERUS FRACTURE Left 06/24/2014   Procedure: LEFT OPEN REDUCTION INTERNAL FIXATION (ORIF) PROXIMAL HUMERUS FRACTURE;  Surgeon: Johnny Bridge, MD;  Location: [facility];  Service: Orthopedics;  Laterality: Left;  . PANCREATECTOMY    . TONSILLECTOMY    . TUBAL LIGATION      There were no vitals filed for this visit.      Subjective Assessment - 01/28/17 2129    Subjective Pt reports no changes since session last week   Pertinent History history of anoxic brain injury  (approx 20 years ago), COPD   Limitations Walking;Standing   Patient Stated Goals "I want to walk."    Currently in Pain? No/denies                         [facility] Adult PT Treatment/Exercise - 01/28/17 0001      Ambulation/Gait   Ambulation/Gait Yes   Ambulation/Gait Assistance 5: Supervision   Ambulation/Gait Assistance Details cues to stay close inside RW and to increase step length   Ambulation Distance (Feet) 112 Feet  3 reps with seated rest period between laps   Assistive device Rolling walker   Gait Pattern Step-to pattern;Decreased weight shift to left;Decreased stance time - left;Trunk flexed   Ambulation Surface Level;Indoor     Knee/Hip Exercises: Aerobic   Nustep Level 4 x 3" with UE's and LE's     NeuroRe-ed; sidestepping along counter 4 reps with bil. UE support with CGA:  Marching in place 10 reps each leg Reaching up into cabinet - placing cones on 2nd shelf and then placing from shelf onto counter with alternating Rt/Lt hand  For dynamic reaching outside BOS Stepping over and back of quad cane for anterior weight shift translation with hand held assist and RUE support on counter  PT Short Term Goals - 01/28/17 2137      PT SHORT TERM GOAL #1   Title Patient with caregiver assistance will demonstrate understanding of initial HEP to improve flexibility and balance. (Target Date: 01/21/17)   Baseline met 01-21-17 -- pt reports she is doing HEP at home   Status Achieved     PT [place] #2   Title Will assess BERG and improve score by 3 points in order to indicate decreased fall risk.     Baseline 22/56 baseline and 30/56 on 01/24/17   Status Achieved     PT SHORT TERM GOAL #3   Title Pt will improve 5TSS to <16 seconds with single UE support in order to indicate imrpoved functional strength.     Baseline 13.69 secs with single UE support.    Status Achieved     PT SHORT TERM GOAL #4   Title Pt will improve TUG to <40  secs in order to indicate decreased fall risk.     Baseline 42.90 secs with RW   Status Partially Met     PT SHORT TERM GOAL #5   Title Pt will ambulate x 200' w/ LRAD at S level over indoor surfaces in order to indicate safe home negotiation.    Baseline Pt amb. 115' with RW - reports fatigue after amb. this distance and requests to sit down  - 01-21-17   Status On-going           PT Long Term Goals - 12/24/16 1936      PT LONG TERM GOAL #1   Title Patient demonstrates with caregiver's cueing progressive HEP correctly. (Target Date: 02/18/17)   Time 8   Period Weeks   Status New     PT LONG TERM GOAL #2   Title Pt will improve BERG balance score by 6 points from baseline in order to indicate decreased fall risk.     Time 8   Period Weeks   Status New     PT LONG TERM GOAL #3   Title Pt will perform TUG <34 secs in order to indicate decreased fall risk.     Time 8   Period Weeks   Status New     PT LONG TERM GOAL #4   Title Pt will perform 8/10 sit<>stand without UE support in order to indicate improved functional strength.     Time 8   Period Weeks   Status New     PT LONG TERM GOAL #5   Title Pt will ambulate up to 350' over unlevel paved surfaces (including curb/ramp) at S level in order to indicate safe short community negotation.     Time 8   Period Weeks   Status New               Plan - 01/28/17 2134    Clinical Impression Statement Pt demonstrating increased step length with use of RW; 2nd rep of gait training was faster than reps 1 and 3, with minimal distractions in path; pt has difficulty with slowing to negotiate obstacles or other people and has tendency to become anxious in these situations   Rehab Potential Good   Clinical Impairments Affecting Rehab Potential co-morbidities, decreased progress in previous therapies   PT Frequency 2x / week   PT Duration 8 weeks   PT Treatment/Interventions ADLs/Self Care Home Management;DME Instruction;Gait  training;Stair training;Functional mobility training;Therapeutic activities;Therapeutic exercise;Balance training;Neuromuscular re-education;Cognitive remediation;Patient/family education;Orthotic Fit/Training;Energy conservation;Vestibular   PT Next Visit  Plan cont balance and gait   Consulted and Agree with Plan of Care Patient;Family member/caregiver      Patient will benefit from skilled therapeutic intervention in order to improve the following deficits and impairments:  Abnormal gait, Cardiopulmonary status limiting activity, Decreased activity tolerance, Decreased balance, Decreased cognition, Decreased coordination, Decreased endurance, Decreased knowledge of use of DME, Decreased mobility, Decreased safety awareness, Decreased strength, Impaired perceived functional ability, Impaired flexibility, Impaired UE functional use, Postural dysfunction  Visit Diagnosis: Other abnormalities of gait and mobility  Unsteadiness on feet       G-Codes - Feb 27, 2017 Dec 28, 2137    Functional Assessment Tool Used (Outpatient Only) Berg score 30/56:  TUG score 42.9 secs with RW   Functional Limitation Mobility: Walking and moving around   Mobility: Walking and Moving Around Current Status [phone]) At least 60 percent but less than 80 percent impaired, limited or restricted   Mobility: Walking and Moving Around Goal Status [phone]) At least 20 percent but less than 40 percent impaired, limited or restricted      Problem List Patient Active Problem List   Diagnosis Date Noted  . Irregular heart beat 11/15/2016  . Urinary tract infection without hematuria   . Severe sepsis ([place]) 11/08/2016  . Leukocytosis 07/31/2016  . Community acquired pneumonia   . Hyperglycemia   . Acute respiratory failure with hypoxia ([place])   . Acute urinary retention   . Elevated troponin   . Sepsis ([place]) 07/18/2016  . Lobar pneumonia, unspecified organism ([place]) 07/18/2016  . Pharyngitis 07/18/2016  . Elevated troponin I level  07/18/2016  . Oral thrush 07/18/2016  . Odynophagia 07/18/2016  . Anoxic brain injury ([place]) 12/29/2015  . Gait disorder 12/28/2015  . Well adult exam 12/08/2014  . Fracture of left humerus 06/24/2014  . Humerus fracture 06/24/2014  . Chest wall contusion 05/03/2014  . Acute bronchitis 11/10/2013  . Left elbow pain 11/03/2013  . Right facial pain 06/19/2013  . Ankle ulcer ([place]) 04/02/2012  . Nausea & vomiting 10/05/2011  . Decubitus ulcer 06/26/2011  . Decubitus ulcer of coccyx 05/21/2011  . Cellulitis and abscess of other specified site 10/11/2010  . B12 deficiency 04/04/2010  . HYPERLIPIDEMIA 04/04/2010  . Anxiety state 11/14/2009  . Vitamin D deficiency 10/21/2009  . MELENA 10/21/2009  . DM2 (diabetes mellitus, type 2) ([place]) 07/07/2009  . FEVER, RECURRENT 06/21/2009  . COPD exacerbation ([place]) 02/25/2009  . OSTEOARTHRITIS 02/25/2009  . LOW BACK PAIN 02/25/2009  . TOBACCO USER 01/13/2009  . Rash and other nonspecific skin eruption 01/13/2009  . FURUNCLE 06/08/2008  . DEPRESSION 02/26/2007  . PAIN, CHRONIC NEC 02/26/2007  . Venous (peripheral) insufficiency 02/26/2007  . GERD 02/26/2007  . HERNIA, INCISIONAL 02/26/2007  . SHOULDER PAIN, LEFT 02/26/2007  . 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 INSOMNIA 02/26/2007  . Abnormality of gait 02/26/2007  . LEG EDEMA, CHRONIC 02/26/2007    Physical Therapy Progress Note  Dates of Reporting Period:  12-24-16 to 02/27/2017  Objective Reports of Subjective Statement: Pt continues to use wheelchair for community amb. But is able to amb. approx. 120' with RW prior to fatigue - needs SBA for safety  Objective Measurements: Berg score 30/56:  TUG score 42  Goal Update: See above for progress towards STG's  Plan:  See above for treatment plan  Reason Skilled Services are Required: Continued balance and gait deficits; continued decr. Independence with gait with deviations noted; pt has more  difficulty with weight shift  onto LLE than onto RLE Pt continues to have difficulty with turning and with negotiating transitional surfaces  Sedona Wenk, [place], PT 01/28/2017, 9:43 PM  [facility] [address] [place], [place], [address] Phone: [phone]   Fax:  [phone]  Name: [name] MRN: [id number] Date of Birth: Jan 27, 1951

## 2017-01-29 ENCOUNTER — Ambulatory Visit: Payer: Medicare Other | Attending: Internal Medicine | Admitting: Physical Therapy

## 2017-01-29 DIAGNOSIS — R269 Unspecified abnormalities of gait and mobility: Secondary | ICD-10-CM | POA: Insufficient documentation

## 2017-01-29 DIAGNOSIS — R2681 Unsteadiness on feet: Secondary | ICD-10-CM | POA: Diagnosis not present

## 2017-01-29 DIAGNOSIS — R2689 Other abnormalities of gait and mobility: Secondary | ICD-10-CM | POA: Diagnosis not present

## 2017-01-29 DIAGNOSIS — M6281 Muscle weakness (generalized): Secondary | ICD-10-CM | POA: Insufficient documentation

## 2017-01-30 NOTE — Therapy (Signed)
[facility] [address] [place], [place], [address] Phone: [phone]   Fax:  [phone]  Physical Therapy Treatment  Patient Details  Name: [name] MRN: [id number] Date of Birth: 1951/04/02 Referring Provider: Lew Dawes  Encounter Date: 01/29/2017      PT End of Session - 01/30/17 1045    Visit Number 11  G1   Number of Visits 17   Date for PT Re-Evaluation 02/22/17   Authorization Type MCR primary, MCD secondary   PT Start Time 1530   PT Stop Time 1614   PT Time Calculation (min) 44 min   Equipment Utilized During Treatment Gait belt      Past Medical History:  Diagnosis Date  . Anoxic brain injury ([place])   . Anxiety   . Chronic neck pain   . Chronic pain in shoulder   . COPD (chronic obstructive pulmonary disease) ([place])   . Depression   . Fracture of left humerus 06/24/2014  . Gait disorder   . GERD (gastroesophageal reflux disease)   . Hernia of abdominal cavity   . 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 Hyperlipidemia   . LBP (low back pain)   . Neuropathy   . Osteoarthritis   . Seizure disorder ([place])   . Seizures ([place])   . Type II or unspecified type diabetes mellitus without mention of complication, not stated as uncontrolled 2010  . Vitamin B 12 deficiency 2011    Past Surgical History:  Procedure Laterality Date  . COLONOSCOPY    . DIAGNOSTIC LAPAROSCOPY     PMH: Exploratory lap  . HERNIA REPAIR     incisional  . ORIF HUMERUS FRACTURE Left 06/24/2014   Procedure: LEFT OPEN REDUCTION INTERNAL FIXATION (ORIF) PROXIMAL HUMERUS FRACTURE;  Surgeon: Johnny Bridge, MD;  Location: [facility];  Service: Orthopedics;  Laterality: Left;  . PANCREATECTOMY    . TONSILLECTOMY    . TUBAL LIGATION      There were no vitals filed for this visit.      Subjective Assessment - 01/30/17 1038    Subjective Pt reports she was tired after therapy yesterday    Pertinent History history of anoxic brain injury (approx 20 years ago), COPD   Limitations Walking;Standing   Patient Stated Goals "I want to walk."    Currently in Pain? Yes   Pain Score 8    Pain Location Back   Pain Orientation Lower   Pain Descriptors / Indicators Aching   Pain Type Chronic pain   Pain Onset More than a month ago   Pain Frequency Constant                         [facility] Adult PT Treatment/Exercise - 01/30/17 0001      Transfers   Transfers Squat Pivot Transfers   Squat Pivot Transfers --  1   Number of Reps --  1   Transfer Cueing cues for hand placement     Ambulation/Gait   Ambulation/Gait Yes   Ambulation/Gait Assistance 5: Supervision   Ambulation/Gait Assistance Details cues to stay close inside RW and to increase step length   Ambulation Distance (Feet) 115 Feet  35' from mat to steps, 40' from steps to [employer] device Rolling walker   Gait Pattern Step-to pattern;Decreased weight shift to left;Decreased stance time - left;Trunk flexed   Ambulation Surface Level;Indoor   Stairs Yes  Stair Management Technique Two rails;Step to pattern;Forwards   Number of Stairs 4   Height of Stairs 6     Knee/Hip Exercises: Aerobic   Nustep Level 4 x 5" with UE's and LE's      NeuroRe-ed:  Pt performed marching inside // bars with UE support, attempting to decrease to 1 UE support with cues to stand erect Sidestepping 10'x 2 reps with bil. UE support, progressing to 1 UE support 10'x 2 reps inside // bars;  Pt has more difficulty sidestepping toward Right side than toward left side  Stepping over and back of black balance beam with bil. UE support 10 reps each leg Standing without UE suppport with EO with head turns 5 reps with cues to stand upright  Tap ups onto 4" step with Rt foot for incr. Weight shift onto LLE - bil. UE support needed  Touching 2 balance bubbles for targets with each foot with bil. UE support - attempting  with only 1 UE support for improved SLS           PT Short Term Goals - 01/28/17 2137      PT SHORT TERM GOAL #1   Title Patient with caregiver assistance will demonstrate understanding of initial HEP to improve flexibility and balance. (Target Date: 01/21/17)   Baseline met 01-21-17 -- pt reports she is doing HEP at home   Status Achieved     PT [place] #2   Title Will assess BERG and improve score by 3 points in order to indicate decreased fall risk.     Baseline 22/56 baseline and 30/56 on 01/24/17   Status Achieved     PT SHORT TERM GOAL #3   Title Pt will improve 5TSS to <16 seconds with single UE support in order to indicate imrpoved functional strength.     Baseline 13.69 secs with single UE support.    Status Achieved     PT SHORT TERM GOAL #4   Title Pt will improve TUG to <40 secs in order to indicate decreased fall risk.     Baseline 42.90 secs with RW   Status Partially Met     PT SHORT TERM GOAL #5   Title Pt will ambulate x 200' w/ LRAD at S level over indoor surfaces in order to indicate safe home negotiation.    Baseline Pt amb. 115' with RW - reports fatigue after amb. this distance and requests to sit down  - 01-21-17   Status On-going           PT Long Term Goals - 12/24/16 1936      PT LONG TERM GOAL #1   Title Patient demonstrates with caregiver's cueing progressive HEP correctly. (Target Date: 02/18/17)   Time 8   Period Weeks   Status New     PT LONG TERM GOAL #2   Title Pt will improve BERG balance score by 6 points from baseline in order to indicate decreased fall risk.     Time 8   Period Weeks   Status New     PT LONG TERM GOAL #3   Title Pt will perform TUG <34 secs in order to indicate decreased fall risk.     Time 8   Period Weeks   Status New     PT LONG TERM GOAL #4   Title Pt will perform 8/10 sit<>stand without UE support in order to indicate improved functional strength.     Time 8  Period Weeks   Status New      PT LONG TERM GOAL #5   Title Pt will ambulate up to 350' over unlevel paved surfaces (including curb/ramp) at S level in order to indicate safe short community negotation.     Time 8   Period Weeks   Status New               Plan - 01/30/17 1046    Clinical Impression Statement Pt continues to have difficulty with LLE SLS and with weight shifting onto LLE.  Pt is demonstrating improved gait pattern with increased bil. step length noted with increased speed.  Pt continues to have difficulty with negotiating transitional surfaces.                                                                                                                                                              Rehab Potential Good   Clinical Impairments Affecting Rehab Potential co-morbidities, decreased progress in previous therapies   PT Frequency 2x / week   PT Duration 8 weeks   PT Treatment/Interventions ADLs/Self Care Home Management;DME Instruction;Gait training;Stair training;Functional mobility training;Therapeutic activities;Therapeutic exercise;Balance training;Neuromuscular re-education;Cognitive remediation;Patient/family education;Orthotic Fit/Training;Energy conservation;Vestibular   PT Next Visit Plan cont balance and gait - cont toward LTG's   Consulted and Agree with Plan of Care Patient;Family member/caregiver   Family Member Consulted Caregiver [name]      Patient will benefit from skilled therapeutic intervention in order to improve the following deficits and impairments:  Abnormal gait, Cardiopulmonary status limiting activity, Decreased activity tolerance, Decreased balance, Decreased cognition, Decreased coordination, Decreased endurance, Decreased knowledge of use of DME, Decreased mobility, Decreased safety awareness, Decreased strength, Impaired perceived functional ability, Impaired flexibility, Impaired UE functional use, Postural dysfunction  Visit Diagnosis: Other abnormalities of  gait and mobility  Unsteadiness on feet     Problem List Patient Active Problem List   Diagnosis Date Noted  . Irregular heart beat 11/15/2016  . Urinary tract infection without hematuria   . Severe sepsis ([place]) 11/08/2016  . Leukocytosis 07/31/2016  . Community acquired pneumonia   . Hyperglycemia   . Acute respiratory failure with hypoxia ([place])   . Acute urinary retention   . Elevated troponin   . Sepsis ([place]) 07/18/2016  . Lobar pneumonia, unspecified organism ([place]) 07/18/2016  . Pharyngitis 07/18/2016  . Elevated troponin I level 07/18/2016  . Oral thrush 07/18/2016  . Odynophagia 07/18/2016  . Anoxic brain injury ([place]) 12/29/2015  . Gait disorder 12/28/2015  . Well adult exam 12/08/2014  . Fracture of left humerus 06/24/2014  . Humerus fracture 06/24/2014  . Chest wall contusion 05/03/2014  . Acute bronchitis 11/10/2013  . Left elbow pain 11/03/2013  . Right facial pain 06/19/2013  . Ankle ulcer ([place]) 04/02/2012  .  Nausea & vomiting 10/05/2011  . Decubitus ulcer 06/26/2011  . Decubitus ulcer of coccyx 05/21/2011  . Cellulitis and abscess of other specified site 10/11/2010  . B12 deficiency 04/04/2010  . HYPERLIPIDEMIA 04/04/2010  . Anxiety state 11/14/2009  . Vitamin D deficiency 10/21/2009  . MELENA 10/21/2009  . DM2 (diabetes mellitus, type 2) ([place]) 07/07/2009  . FEVER, RECURRENT 06/21/2009  . COPD exacerbation ([place]) 02/25/2009  . OSTEOARTHRITIS 02/25/2009  . LOW BACK PAIN 02/25/2009  . TOBACCO USER 01/13/2009  . Rash and other nonspecific skin eruption 01/13/2009  . FURUNCLE 06/08/2008  . DEPRESSION 02/26/2007  . PAIN, CHRONIC NEC 02/26/2007  . Venous (peripheral) insufficiency 02/26/2007  . GERD 02/26/2007  . HERNIA, INCISIONAL 02/26/2007  . SHOULDER PAIN, LEFT 02/26/2007  . 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 INSOMNIA 02/26/2007  . Abnormality of gait 02/26/2007  . LEG EDEMA, CHRONIC 02/26/2007     Alda Lea, PT 01/30/2017, 10:51 AM  [facility] [address] [place], [place], [address] Phone: [phone]   Fax:  [phone]  Name: [name] MRN: [id number] Date of Birth: [birth year]

## 2017-02-04 ENCOUNTER — Encounter: Payer: Self-pay | Admitting: Rehabilitation

## 2017-02-04 ENCOUNTER — Ambulatory Visit: Payer: Medicare Other | Admitting: Rehabilitation

## 2017-02-04 DIAGNOSIS — R2681 Unsteadiness on feet: Secondary | ICD-10-CM

## 2017-02-04 DIAGNOSIS — M6281 Muscle weakness (generalized): Secondary | ICD-10-CM | POA: Diagnosis not present

## 2017-02-04 DIAGNOSIS — R269 Unspecified abnormalities of gait and mobility: Secondary | ICD-10-CM | POA: Diagnosis not present

## 2017-02-04 DIAGNOSIS — R2689 Other abnormalities of gait and mobility: Secondary | ICD-10-CM

## 2017-02-04 NOTE — Therapy (Signed)
[facility] [address] [place], [place], [address] Phone: [phone]   Fax:  [phone]  Physical Therapy Treatment  Patient Details  Name: [name] MRN: [id number] Date of Birth: 05-22-1951 Referring Provider: Lew Dawes  Encounter Date: 02/04/2017      PT End of Session - 02/04/17 1429    Visit Number 12  G1   Number of Visits 17   Date for PT Re-Evaluation 02/22/17   Authorization Type MCR primary, MCD secondary   PT Start Time 1425   PT Stop Time 1513   PT Time Calculation (min) 48 min   Equipment Utilized During Treatment Gait belt      Past Medical History:  Diagnosis Date  . Anoxic brain injury ([place])   . Anxiety   . Chronic neck pain   . Chronic pain in shoulder   . COPD (chronic obstructive pulmonary disease) ([place])   . Depression   . Fracture of left humerus 06/24/2014  . Gait disorder   . GERD (gastroesophageal reflux disease)   . Hernia of abdominal cavity   . 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 Hyperlipidemia   . LBP (low back pain)   . Neuropathy   . Osteoarthritis   . Seizure disorder ([place])   . Seizures ([place])   . Type II or unspecified type diabetes mellitus without mention of complication, not stated as uncontrolled 2010  . Vitamin B 12 deficiency 2011    Past Surgical History:  Procedure Laterality Date  . COLONOSCOPY    . DIAGNOSTIC LAPAROSCOPY     PMH: Exploratory lap  . HERNIA REPAIR     incisional  . ORIF HUMERUS FRACTURE Left 06/24/2014   Procedure: LEFT OPEN REDUCTION INTERNAL FIXATION (ORIF) PROXIMAL HUMERUS FRACTURE;  Surgeon: Johnny Bridge, MD;  Location: [facility];  Service: Orthopedics;  Laterality: Left;  . PANCREATECTOMY    . TONSILLECTOMY    . TUBAL LIGATION      There were no vitals filed for this visit.      Subjective Assessment - 02/04/17 1427    Subjective Pt reports no changes, no falls.    Patient is  accompained by: --  new caregiver, Tonya   Pertinent History history of anoxic brain injury (approx 20 years ago), COPD   Limitations Walking;Standing   Patient Stated Goals "I want to walk."    Currently in Pain? Yes   Pain Score 7    Pain Location Back   Pain Orientation Lower   Pain Descriptors / Indicators Aching   Pain Type Chronic pain   Pain Onset More than a month ago   Pain Frequency Constant   Aggravating Factors  standing    Pain Relieving Factors medication                          [facility] Adult PT Treatment/Exercise - 02/04/17 1449      Ambulation/Gait   Ambulation/Gait Yes   Ambulation/Gait Assistance 5: Supervision   Ambulation/Gait Assistance Details Continue to work on gait with RW for improved distance and gait quality.  Continues to require cues for keeping RW closer to her and taking larger steps with RLE.     Ambulation Distance (Feet) 75 Feet  x 2 reps   Assistive device Rolling walker   Gait Pattern Step-to pattern;Decreased weight shift to left;Decreased stance time - left;Trunk flexed   Ambulation Surface Level;Indoor  Stairs Yes   Stair Management Technique Two rails;Step to pattern;Forwards   Number of Stairs 4   Height of Stairs 6     Neuro Re-ed    Neuro Re-ed Details  In // bars had pt work on static balance while standing on foam airdex pad.  Maintained balance with BUE support>single UE support>no UE support.  Utlized mirror to attain midline posture as she still tends to keep weight on RLE.  Tactile and verbal cues for increased L lateral weight shift and upright posture.  Pt able to maintain balance x 15 secs in each position.  At counter top had pt perform side stepping x 2 reps down and back to increase lateral weight shifting to the L.  Cues and faciliation for weight shift L, however did note improvement within task.  Then performed standing hip abduction x 10 reps each and standing knee flex again x 10 reps, to facilitate  improved L lateral weight shift during tasks.       Knee/Hip Exercises: Aerobic   Nustep Level 3 x 5" with BUEs/LEs                PT Education - 02/04/17 1519    Education provided Yes   Education Details importance of compliance with HEP   Person(s) Educated Patient   Methods Explanation   Comprehension Verbalized understanding          PT Short Term Goals - 01/28/17 2137      PT SHORT TERM GOAL #1   Title Patient with caregiver assistance will demonstrate understanding of initial HEP to improve flexibility and balance. (Target Date: 01/21/17)   Baseline met 01-21-17 -- pt reports she is doing HEP at home   Status Achieved     PT [place] #2   Title Will assess BERG and improve score by 3 points in order to indicate decreased fall risk.     Baseline 22/56 baseline and 30/56 on 01/24/17   Status Achieved     PT SHORT TERM GOAL #3   Title Pt will improve 5TSS to <16 seconds with single UE support in order to indicate imrpoved functional strength.     Baseline 13.69 secs with single UE support.    Status Achieved     PT SHORT TERM GOAL #4   Title Pt will improve TUG to <40 secs in order to indicate decreased fall risk.     Baseline 42.90 secs with RW   Status Partially Met     PT SHORT TERM GOAL #5   Title Pt will ambulate x 200' w/ LRAD at S level over indoor surfaces in order to indicate safe home negotiation.    Baseline Pt amb. 115' with RW - reports fatigue after amb. this distance and requests to sit down  - 01-21-17   Status On-going           PT Long Term Goals - 12/24/16 1936      PT LONG TERM GOAL #1   Title Patient demonstrates with caregiver's cueing progressive HEP correctly. (Target Date: 02/18/17)   Time 8   Period Weeks   Status New     PT LONG TERM GOAL #2   Title Pt will improve BERG balance score by 6 points from baseline in order to indicate decreased fall risk.     Time 8   Period Weeks   Status New     PT LONG TERM GOAL #3    Title Pt  will perform TUG <34 secs in order to indicate decreased fall risk.     Time 8   Period Weeks   Status New     PT LONG TERM GOAL #4   Title Pt will perform 8/10 sit<>stand without UE support in order to indicate improved functional strength.     Time 8   Period Weeks   Status New     PT LONG TERM GOAL #5   Title Pt will ambulate up to 350' over unlevel paved surfaces (including curb/ramp) at S level in order to indicate safe short community negotation.     Time 8   Period Weeks   Status New               Plan - 02/04/17 1519    Clinical Impression Statement Skilled session continues to focus on tasks that encourage midline posture and increased weight shift and WB on LLE to carryover to improved gait pattern.  Pt continues to have difficulty maintaining increased R step length and upright posture as well as making transitional movements.     Rehab Potential Good   Clinical Impairments Affecting Rehab Potential co-morbidities, decreased progress in previous therapies   PT Frequency 2x / week   PT Duration 8 weeks   PT Treatment/Interventions ADLs/Self Care Home Management;DME Instruction;Gait training;Stair training;Functional mobility training;Therapeutic activities;Therapeutic exercise;Balance training;Neuromuscular re-education;Cognitive remediation;Patient/family education;Orthotic Fit/Training;Energy conservation;Vestibular   PT Next Visit Plan cont balance and gait - cont toward LTG's   Consulted and Agree with Plan of Care Patient;Family member/caregiver   Family Member Consulted Caregiver Tonya      Patient will benefit from skilled therapeutic intervention in order to improve the following deficits and impairments:  Abnormal gait, Cardiopulmonary status limiting activity, Decreased activity tolerance, Decreased balance, Decreased cognition, Decreased coordination, Decreased endurance, Decreased knowledge of use of DME, Decreased mobility, Decreased safety  awareness, Decreased strength, Impaired perceived functional ability, Impaired flexibility, Impaired UE functional use, Postural dysfunction  Visit Diagnosis: Other abnormalities of gait and mobility  Unsteadiness on feet  Muscle weakness (generalized)     Problem List Patient Active Problem List   Diagnosis Date Noted  . Irregular heart beat 11/15/2016  . Urinary tract infection without hematuria   . Severe sepsis ([place]) 11/08/2016  . Leukocytosis 07/31/2016  . Community acquired pneumonia   . Hyperglycemia   . Acute respiratory failure with hypoxia ([place])   . Acute urinary retention   . Elevated troponin   . Sepsis ([place]) 07/18/2016  . Lobar pneumonia, unspecified organism ([place]) 07/18/2016  . Pharyngitis 07/18/2016  . Elevated troponin I level 07/18/2016  . Oral thrush 07/18/2016  . Odynophagia 07/18/2016  . Anoxic brain injury ([place]) 12/29/2015  . Gait disorder 12/28/2015  . Well adult exam 12/08/2014  . Fracture of left humerus 06/24/2014  . Humerus fracture 06/24/2014  . Chest wall contusion 05/03/2014  . Acute bronchitis 11/10/2013  . Left elbow pain 11/03/2013  . Right facial pain 06/19/2013  . Ankle ulcer ([place]) 04/02/2012  . Nausea & vomiting 10/05/2011  . Decubitus ulcer 06/26/2011  . Decubitus ulcer of coccyx 05/21/2011  . Cellulitis and abscess of other specified site 10/11/2010  . B12 deficiency 04/04/2010  . HYPERLIPIDEMIA 04/04/2010  . Anxiety state 11/14/2009  . Vitamin D deficiency 10/21/2009  . MELENA 10/21/2009  . DM2 (diabetes mellitus, type 2) ([place]) 07/07/2009  . FEVER, RECURRENT 06/21/2009  . COPD exacerbation ([place]) 02/25/2009  . OSTEOARTHRITIS 02/25/2009  . LOW BACK PAIN 02/25/2009  . TOBACCO USER  01/13/2009  . Rash and other nonspecific skin eruption 01/13/2009  . FURUNCLE 06/08/2008  . DEPRESSION 02/26/2007  . PAIN, CHRONIC NEC 02/26/2007  . Venous (peripheral) insufficiency 02/26/2007  . GERD 02/26/2007  . HERNIA, INCISIONAL 02/26/2007   . SHOULDER PAIN, LEFT 02/26/2007  . 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 INSOMNIA 02/26/2007  . Abnormality of gait 02/26/2007  . LEG EDEMA, CHRONIC 02/26/2007    Cameron Sprang, PT, MPT [facility] [address] [place], [place], [address] Phone: [phone]   Fax:  [phone] 02/04/17, 3:22 PM  Name: [name] MRN: [id number] Date of Birth: 1951-09-30

## 2017-02-07 ENCOUNTER — Encounter: Payer: Self-pay | Admitting: Rehabilitation

## 2017-02-07 ENCOUNTER — Ambulatory Visit: Payer: Medicare Other | Admitting: Rehabilitation

## 2017-02-07 DIAGNOSIS — R2681 Unsteadiness on feet: Secondary | ICD-10-CM | POA: Diagnosis not present

## 2017-02-07 DIAGNOSIS — M6281 Muscle weakness (generalized): Secondary | ICD-10-CM

## 2017-02-07 DIAGNOSIS — R2689 Other abnormalities of gait and mobility: Secondary | ICD-10-CM | POA: Diagnosis not present

## 2017-02-07 DIAGNOSIS — R269 Unspecified abnormalities of gait and mobility: Secondary | ICD-10-CM | POA: Diagnosis not present

## 2017-02-07 NOTE — Therapy (Signed)
[facility] [address] [place], [place], [address] Phone: [phone]   Fax:  [phone]  Physical Therapy Treatment  Patient Details  Name: [name] MRN: [id number] Date of Birth: 09/15/1951 Referring Provider: Plotnikov, Aleksei  Encounter Date: 02/07/2017      PT End of Session - 02/07/17 1456    Visit Number 13  G1   Number of Visits 17   Date for PT Re-Evaluation 02/22/17   Authorization Type MCR primary, MCD secondary   PT Start Time 1447   PT Stop Time 1530   PT Time Calculation (min) 43 min   Equipment Utilized During Treatment --   Activity Tolerance Patient tolerated treatment well   Behavior During Therapy WFL for tasks assessed/performed      Past Medical History:  Diagnosis Date  . Anoxic brain injury ([facility])   . Anxiety   . Chronic neck pain   . Chronic pain in shoulder   . COPD (chronic obstructive pulmonary disease) ([facility])   . Depression   . Fracture of left humerus 06/24/2014  . Gait disorder   . GERD (gastroesophageal reflux disease)   . Hernia of abdominal cavity   . 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 Hyperlipidemia   . LBP (low back pain)   . Neuropathy   . Osteoarthritis   . Seizure disorder ([facility])   . Seizures ([facility])   . Type II or unspecified type diabetes mellitus without mention of complication, not stated as uncontrolled 2010  . Vitamin B 12 deficiency 2011    Past Surgical History:  Procedure Laterality Date  . COLONOSCOPY    . DIAGNOSTIC LAPAROSCOPY     PMH: Exploratory lap  . HERNIA REPAIR     incisional  . ORIF HUMERUS FRACTURE Left 06/24/2014   Procedure: LEFT OPEN REDUCTION INTERNAL FIXATION (ORIF) PROXIMAL HUMERUS FRACTURE;  Surgeon: Joshua P Landau, MD;  [facility];  Service: Orthopedics;  Laterality: Left;  . PANCREATECTOMY    . TONSILLECTOMY    . TUBAL LIGATION      There were no vitals filed for this  visit.      Subjective Assessment - 02/07/17 1454    Subjective Reports changes in caretakers recently.  Husband went into more details regarding changes and that they would have a new CNA starting Monday.    Pertinent History history of anoxic brain injury (approx 20 years ago), COPD   Limitations Walking;Standing   Patient Stated Goals "I want to walk."    Currently in Pain? Yes   Pain Score 8    Pain Location Back   Pain Orientation Lower   Pain Descriptors / Indicators Aching   Pain Type Chronic pain   Pain Onset More than a month ago   Pain Frequency Constant   Aggravating Factors  standing    Pain Relieving Factors medication                          [facility] Adult PT Treatment/Exercise - 02/07/17 1506      Transfers   Transfers Sit to Stand;Stand to Sit   Sit to Stand 6: Modified independent (Device/Increase time)   Stand to Sit 6: Modified independent (Device/Increase time)     Ambulation/Gait   Ambulation/Gait Yes   Ambulation/Gait Assistance 5: Supervision   Ambulation/Gait Assistance Details Worked on gait with with RW while negotiating obstacles.  Pt able to perform at   S level with some assist initially to guide RW but did improve greatly within session and even noted improved turning when sitting.   Max cues for smaller movements with RW and then stepping into RW as she tends to place RW too far away from herself.     Ambulation Distance (Feet) 30 Feet  x2 reps then 20' x 2 to/from // bars and to [employer] device Rolling walker   Gait Pattern Step-to pattern;Decreased weight shift to left;Decreased stance time - left;Trunk flexed   Ambulation Surface Level;Indoor     Neuro Re-ed    Neuro Re-ed Details  in // bars had pt work on finding midline posture while standing on rocker board in horizontal bias as she tends to lose balance to the R.  With use of mirror to assist in maintaining midline.  Note she had increased difficulty maintaining  midline initially and shifting to the L.  Progressed from BUE support>single UE support and no UE support.  Pt able to maintain midline/balance up to 10-15 secs x 3 reps.       Knee/Hip Exercises: Aerobic   Nustep level 4 x 6" w/ BUE/LEs  cues to maintain SPM at 60's                PT Education - 02/07/17 1455    Education provided Yes   Education Details safety when turning with RW   Person(s) Educated Patient;Spouse   Methods Explanation   Comprehension Verbalized understanding          PT Short Term Goals - 01/28/17 2137      PT SHORT TERM GOAL #1   Title Patient with caregiver assistance will demonstrate understanding of initial HEP to improve flexibility and balance. (Target Date: 01/21/17)   Baseline met 01-21-17 -- pt reports she is doing HEP at home   Status Achieved     PT [place] #2   Title Will assess BERG and improve score by 3 points in order to indicate decreased fall risk.     Baseline 22/56 baseline and 30/56 on 01/24/17   Status Achieved     PT SHORT TERM GOAL #3   Title Pt will improve 5TSS to <16 seconds with single UE support in order to indicate imrpoved functional strength.     Baseline 13.69 secs with single UE support.    Status Achieved     PT SHORT TERM GOAL #4   Title Pt will improve TUG to <40 secs in order to indicate decreased fall risk.     Baseline 42.90 secs with RW   Status Partially Met     PT SHORT TERM GOAL #5   Title Pt will ambulate x 200' w/ LRAD at S level over indoor surfaces in order to indicate safe home negotiation.    Baseline Pt amb. 115' with RW - reports fatigue after amb. this distance and requests to sit down  - 01-21-17   Status On-going           PT Long Term Goals - 12/24/16 1936      PT LONG TERM GOAL #1   Title Patient demonstrates with caregiver's cueing progressive HEP correctly. (Target Date: 02/18/17)   Time 8   Period Weeks   Status New     PT LONG TERM GOAL #2   Title Pt will improve  BERG balance score by 6 points from baseline in order to indicate decreased fall risk.  Time 8   Period Weeks   Status New     PT LONG TERM GOAL #3   Title Pt will perform TUG <34 secs in order to indicate decreased fall risk.     Time 8   Period Weeks   Status New     PT LONG TERM GOAL #4   Title Pt will perform 8/10 sit<>stand without UE support in order to indicate improved functional strength.     Time 8   Period Weeks   Status New     PT LONG TERM GOAL #5   Title Pt will ambulate up to 350' over unlevel paved surfaces (including curb/ramp) at S level in order to indicate safe short community negotation.     Time 8   Period Weeks   Status New               Plan - 02/07/17 1945    Clinical Impression Statement Skilled session continues to focus on balance tasks to encourage midline posture, gait with RW negotiating obstacles for safe RW use, and seated therex to increase overall strength and endurance.  Tolerated well and note changes within sessions, but only mild changes from session to session.    Rehab Potential Good   Clinical Impairments Affecting Rehab Potential co-morbidities, decreased progress in previous therapies   PT Frequency 2x / week   PT Duration 8 weeks   PT Treatment/Interventions ADLs/Self Care Home Management;DME Instruction;Gait training;Stair training;Functional mobility training;Therapeutic activities;Therapeutic exercise;Balance training;Neuromuscular re-education;Cognitive remediation;Patient/family education;Orthotic Fit/Training;Energy conservation;Vestibular   PT Next Visit Plan cont balance and gait - cont toward LTG's   Consulted and Agree with Plan of Care Patient;Family member/caregiver   Family Member Consulted Caregiver Tonya      Patient will benefit from skilled therapeutic intervention in order to improve the following deficits and impairments:  Abnormal gait, Cardiopulmonary status limiting activity, Decreased activity  tolerance, Decreased balance, Decreased cognition, Decreased coordination, Decreased endurance, Decreased knowledge of use of DME, Decreased mobility, Decreased safety awareness, Decreased strength, Impaired perceived functional ability, Impaired flexibility, Impaired UE functional use, Postural dysfunction  Visit Diagnosis: Other abnormalities of gait and mobility  Unsteadiness on feet  Muscle weakness (generalized)     Problem List Patient Active Problem List   Diagnosis Date Noted  . Irregular heart beat 11/15/2016  . Urinary tract infection without hematuria   . Severe sepsis ([facility]) 11/08/2016  . Leukocytosis 07/31/2016  . Community acquired pneumonia   . Hyperglycemia   . Acute respiratory failure with hypoxia ([facility])   . Acute urinary retention   . Elevated troponin   . Sepsis ([facility]) 07/18/2016  . Lobar pneumonia, unspecified organism ([facility]) 07/18/2016  . Pharyngitis 07/18/2016  . Elevated troponin I level 07/18/2016  . Oral thrush 07/18/2016  . Odynophagia 07/18/2016  . Anoxic brain injury ([facility]) 12/29/2015  . Gait disorder 12/28/2015  . Well adult exam 12/08/2014  . Fracture of left humerus 06/24/2014  . Humerus fracture 06/24/2014  . Chest wall contusion 05/03/2014  . Acute bronchitis 11/10/2013  . Left elbow pain 11/03/2013  . Right facial pain 06/19/2013  . Ankle ulcer ([facility]) 04/02/2012  . Nausea & vomiting 10/05/2011  . Decubitus ulcer 06/26/2011  . Decubitus ulcer of coccyx 05/21/2011  . Cellulitis and abscess of other specified site 10/11/2010  . B12 deficiency 04/04/2010  . HYPERLIPIDEMIA 04/04/2010  . Anxiety state 11/14/2009  . Vitamin D deficiency 10/21/2009  . MELENA 10/21/2009  . DM2 (diabetes mellitus, type 2) ([facility]) 07/07/2009  .   FEVER, RECURRENT 06/21/2009  . COPD exacerbation ([facility]) 02/25/2009  . OSTEOARTHRITIS 02/25/2009  . LOW BACK PAIN 02/25/2009  . TOBACCO USER 01/13/2009  . Rash and other nonspecific skin eruption 01/13/2009  . FURUNCLE  06/08/2008  . DEPRESSION 02/26/2007  . PAIN, CHRONIC NEC 02/26/2007  . Venous (peripheral) insufficiency 02/26/2007  . GERD 02/26/2007  . HERNIA, INCISIONAL 02/26/2007  . SHOULDER PAIN, LEFT 02/26/2007  . 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 INSOMNIA 02/26/2007  . Abnormality of gait 02/26/2007  . LEG EDEMA, CHRONIC 02/26/2007     , PT, MPT [facility] [address] [place], [place], [address] Phone: [phone]   Fax:  [phone] 02/07/17, 7:48 PM  Name: [name] MRN: [id number] Date of Birth: 02/13/1951   

## 2017-02-11 ENCOUNTER — Ambulatory Visit: Payer: Medicare Other | Admitting: Physical Therapy

## 2017-02-11 DIAGNOSIS — R2681 Unsteadiness on feet: Secondary | ICD-10-CM | POA: Diagnosis not present

## 2017-02-11 DIAGNOSIS — R2689 Other abnormalities of gait and mobility: Secondary | ICD-10-CM | POA: Diagnosis not present

## 2017-02-11 DIAGNOSIS — M6281 Muscle weakness (generalized): Secondary | ICD-10-CM | POA: Diagnosis not present

## 2017-02-11 DIAGNOSIS — R269 Unspecified abnormalities of gait and mobility: Secondary | ICD-10-CM | POA: Diagnosis not present

## 2017-02-12 NOTE — Therapy (Signed)
[facility] [address] [place], [place], [address] Phone: [phone]   Fax:  [phone]  Physical Therapy Treatment  Patient Details  Name: [name] MRN: [id number] Date of Birth: 1950/10/23 Referring Provider: Lew Dawes  Encounter Date: 02/11/2017      PT End of Session - 02/12/17 2203    Visit Number 14  G2   Date for PT Re-Evaluation 02/22/17   Authorization Type MCR primary, MCD secondary   PT Start Time 1448   PT Stop Time 1535   PT Time Calculation (min) 47 min      Past Medical History:  Diagnosis Date  . Anoxic brain injury ([place])   . Anxiety   . Chronic neck pain   . Chronic pain in shoulder   . COPD (chronic obstructive pulmonary disease) ([place])   . Depression   . Fracture of left humerus 06/24/2014  . Gait disorder   . GERD (gastroesophageal reflux disease)   . Hernia of abdominal cavity   . 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 Hyperlipidemia   . LBP (low back pain)   . Neuropathy   . Osteoarthritis   . Seizure disorder ([place])   . Seizures ([place])   . Type II or unspecified type diabetes mellitus without mention of complication, not stated as uncontrolled 2010  . Vitamin B 12 deficiency 2011    Past Surgical History:  Procedure Laterality Date  . COLONOSCOPY    . DIAGNOSTIC LAPAROSCOPY     PMH: Exploratory lap  . HERNIA REPAIR     incisional  . ORIF HUMERUS FRACTURE Left 06/24/2014   Procedure: LEFT OPEN REDUCTION INTERNAL FIXATION (ORIF) PROXIMAL HUMERUS FRACTURE;  Surgeon: Johnny Bridge, MD;  Location: [facility];  Service: Orthopedics;  Laterality: Left;  . PANCREATECTOMY    . TONSILLECTOMY    . TUBAL LIGATION      There were no vitals filed for this visit.      Subjective Assessment - 02/12/17 2148    Subjective Pt has new CNA today; reports no changes since last PT session - continues to walk some at home   Patient is  accompained by: --  CNA   Pertinent History history of anoxic brain injury (approx 20 years ago), COPD   Patient Stated Goals "I want to walk."                          [facility] Adult PT Treatment/Exercise - 02/12/17 0001      Ambulation/Gait   Ambulation/Gait Yes   Ambulation/Gait Assistance 5: Supervision   Ambulation Distance (Feet) 100 Feet   Assistive device Rolling walker   Gait Pattern Step-to pattern;Decreased weight shift to left;Decreased stance time - left;Trunk flexed   Ambulation Surface Level;Indoor     Knee/Hip Exercises: Aerobic   Nustep level 4 x 5" with bil. UE's      NeuroRe-ed:  Pt performed fig. 8's around cones with RW to improve balance with turning with CGA  Pt performed sidestepping inside // bars 10' x 2 reps Alternate tap ups to 4" step with bil. UE support on RW -5 reps each leg  Stepping over and back of black balance beam 5 reps each LE   Touching balance bubbles for targets with CGA with RW to improve SLS on each leg         PT Short Term Goals - 01/28/17 2137  PT SHORT TERM GOAL #1   Title Patient with caregiver assistance will demonstrate understanding of initial HEP to improve flexibility and balance. (Target Date: 01/21/17)   Baseline met 01-21-17 -- pt reports she is doing HEP at home   Status Achieved     PT [place] #2   Title Will assess BERG and improve score by 3 points in order to indicate decreased fall risk.     Baseline 22/56 baseline and 30/56 on 01/24/17   Status Achieved     PT SHORT TERM GOAL #3   Title Pt will improve 5TSS to <16 seconds with single UE support in order to indicate imrpoved functional strength.     Baseline 13.69 secs with single UE support.    Status Achieved     PT SHORT TERM GOAL #4   Title Pt will improve TUG to <40 secs in order to indicate decreased fall risk.     Baseline 42.90 secs with RW   Status Partially Met     PT SHORT TERM GOAL #5   Title Pt will ambulate x  200' w/ LRAD at S level over indoor surfaces in order to indicate safe home negotiation.    Baseline Pt amb. 115' with RW - reports fatigue after amb. this distance and requests to sit down  - 01-21-17   Status On-going           PT Long Term Goals - 12/24/16 1936      PT LONG TERM GOAL #1   Title Patient demonstrates with caregiver's cueing progressive HEP correctly. (Target Date: 02/18/17)   Time 8   Period Weeks   Status New     PT LONG TERM GOAL #2   Title Pt will improve BERG balance score by 6 points from baseline in order to indicate decreased fall risk.     Time 8   Period Weeks   Status New     PT LONG TERM GOAL #3   Title Pt will perform TUG <34 secs in order to indicate decreased fall risk.     Time 8   Period Weeks   Status New     PT LONG TERM GOAL #4   Title Pt will perform 8/10 sit<>stand without UE support in order to indicate improved functional strength.     Time 8   Period Weeks   Status New     PT LONG TERM GOAL #5   Title Pt will ambulate up to 350' over unlevel paved surfaces (including curb/ramp) at S level in order to indicate safe short community negotation.     Time 8   Period Weeks   Status New               Plan - 02/12/17 2204    Clinical Impression Statement Pt continues to have difficulty negotiating transitional surfaces and turns, especially in tight spaces. Pt also continues to have difficulty weight shifting onto LLE in standing.   Rehab Potential Good   Clinical Impairments Affecting Rehab Potential co-morbidities, decreased progress in previous therapies   PT Frequency 2x / week   PT Duration 8 weeks   PT Treatment/Interventions ADLs/Self Care Home Management;DME Instruction;Gait training;Stair training;Functional mobility training;Therapeutic activities;Therapeutic exercise;Balance training;Neuromuscular re-education;Cognitive remediation;Patient/family education;Orthotic Fit/Training;Energy conservation;Vestibular   PT Next  Visit Plan cont balance and gait - cont toward LTG's   Consulted and Agree with Plan of Care Patient      Patient will benefit from skilled therapeutic intervention in  order to improve the following deficits and impairments:  Abnormal gait, Cardiopulmonary status limiting activity, Decreased activity tolerance, Decreased balance, Decreased cognition, Decreased coordination, Decreased endurance, Decreased knowledge of use of DME, Decreased mobility, Decreased safety awareness, Decreased strength, Impaired perceived functional ability, Impaired flexibility, Impaired UE functional use, Postural dysfunction  Visit Diagnosis: Other abnormalities of gait and mobility  Unsteadiness on feet     Problem List Patient Active Problem List   Diagnosis Date Noted  . Irregular heart beat 11/15/2016  . Urinary tract infection without hematuria   . Severe sepsis ([place]) 11/08/2016  . Leukocytosis 07/31/2016  . Community acquired pneumonia   . Hyperglycemia   . Acute respiratory failure with hypoxia ([place])   . Acute urinary retention   . Elevated troponin   . Sepsis ([place]) 07/18/2016  . Lobar pneumonia, unspecified organism ([place]) 07/18/2016  . Pharyngitis 07/18/2016  . Elevated troponin I level 07/18/2016  . Oral thrush 07/18/2016  . Odynophagia 07/18/2016  . Anoxic brain injury ([place]) 12/29/2015  . Gait disorder 12/28/2015  . Well adult exam 12/08/2014  . Fracture of left humerus 06/24/2014  . Humerus fracture 06/24/2014  . Chest wall contusion 05/03/2014  . Acute bronchitis 11/10/2013  . Left elbow pain 11/03/2013  . Right facial pain 06/19/2013  . Ankle ulcer ([place]) 04/02/2012  . Nausea & vomiting 10/05/2011  . Decubitus ulcer 06/26/2011  . Decubitus ulcer of coccyx 05/21/2011  . Cellulitis and abscess of other specified site 10/11/2010  . B12 deficiency 04/04/2010  . HYPERLIPIDEMIA 04/04/2010  . Anxiety state 11/14/2009  . Vitamin D deficiency 10/21/2009  . MELENA 10/21/2009  . DM2  (diabetes mellitus, type 2) ([place]) 07/07/2009  . FEVER, RECURRENT 06/21/2009  . COPD exacerbation ([place]) 02/25/2009  . OSTEOARTHRITIS 02/25/2009  . LOW BACK PAIN 02/25/2009  . TOBACCO USER 01/13/2009  . Rash and other nonspecific skin eruption 01/13/2009  . FURUNCLE 06/08/2008  . DEPRESSION 02/26/2007  . PAIN, CHRONIC NEC 02/26/2007  . Venous (peripheral) insufficiency 02/26/2007  . GERD 02/26/2007  . HERNIA, INCISIONAL 02/26/2007  . SHOULDER PAIN, LEFT 02/26/2007  . 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 INSOMNIA 02/26/2007  . Abnormality of gait 02/26/2007  . LEG EDEMA, CHRONIC 02/26/2007    Alda Lea, PT 02/12/2017, 10:10 PM  [facility] [address] [place], [place], [address] Phone: [phone]   Fax:  [phone]  Name: ROHINI JAROSZEWSKI MRN: [id number] Date of Birth: [birth year]

## 2017-02-14 ENCOUNTER — Ambulatory Visit: Payer: Medicare Other | Admitting: Rehabilitation

## 2017-02-14 ENCOUNTER — Encounter: Payer: Self-pay | Admitting: Rehabilitation

## 2017-02-14 DIAGNOSIS — R2689 Other abnormalities of gait and mobility: Secondary | ICD-10-CM

## 2017-02-14 DIAGNOSIS — M47816 Spondylosis without myelopathy or radiculopathy, lumbar region: Secondary | ICD-10-CM | POA: Diagnosis not present

## 2017-02-14 DIAGNOSIS — Z79891 Long term (current) use of opiate analgesic: Secondary | ICD-10-CM | POA: Diagnosis not present

## 2017-02-14 DIAGNOSIS — R2681 Unsteadiness on feet: Secondary | ICD-10-CM | POA: Diagnosis not present

## 2017-02-14 DIAGNOSIS — G894 Chronic pain syndrome: Secondary | ICD-10-CM | POA: Diagnosis not present

## 2017-02-14 DIAGNOSIS — M6281 Muscle weakness (generalized): Secondary | ICD-10-CM

## 2017-02-14 DIAGNOSIS — R269 Unspecified abnormalities of gait and mobility: Secondary | ICD-10-CM | POA: Diagnosis not present

## 2017-02-14 NOTE — Patient Instructions (Signed)
[name], remember to do this at night:  1: Once at edge of bed, get walker in front of you and push with your hands ON THE BED to stand.    2: Once standing, move the walker a little, then move your feet a little.  Keep doing this until BOTH legs are at the potty chair and walker is ALL THE WAY around you.   3:  Once on the potty chair, stay sitting and lean to your side several times to pull underwear down.   4: When ready to stand, push with hands ON THE POTTY CHAIR.    5:  Once standing, move the walker a little, then you move a little until BOTH LEGS and WALKER are touching the bed.    6:  Reach back for the bed and sit.

## 2017-02-14 NOTE — Therapy (Signed)
[facility] [address] [place], [place], [address] Phone: [phone]   Fax:  [phone]  Physical Therapy Treatment  Patient Details  Name: [name] MRN: [id number] Date of Birth: 1950-10-29 Referring Provider: Lew Dawes  Encounter Date: 02/14/2017      PT End of Session - 02/14/17 1908    Visit Number 15  G2   Number of Visits 17   Date for PT Re-Evaluation 02/22/17   Authorization Type MCR primary, MCD secondary   PT Start Time 1447   PT Stop Time 1530   PT Time Calculation (min) 43 min   Activity Tolerance Patient tolerated treatment well   Behavior During Therapy [facility] for tasks assessed/performed      Past Medical History:  Diagnosis Date  . Anoxic brain injury ([place])   . Anxiety   . Chronic neck pain   . Chronic pain in shoulder   . COPD (chronic obstructive pulmonary disease) ([place])   . Depression   . Fracture of left humerus 06/24/2014  . Gait disorder   . GERD (gastroesophageal reflux disease)   . Hernia of abdominal cavity   . 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 Hyperlipidemia   . LBP (low back pain)   . Neuropathy   . Osteoarthritis   . Seizure disorder ([place])   . Seizures ([place])   . Type II or unspecified type diabetes mellitus without mention of complication, not stated as uncontrolled 2010  . Vitamin B 12 deficiency 2011    Past Surgical History:  Procedure Laterality Date  . COLONOSCOPY    . DIAGNOSTIC LAPAROSCOPY     PMH: Exploratory lap  . HERNIA REPAIR     incisional  . ORIF HUMERUS FRACTURE Left 06/24/2014   Procedure: LEFT OPEN REDUCTION INTERNAL FIXATION (ORIF) PROXIMAL HUMERUS FRACTURE;  Surgeon: Johnny Bridge, MD;  Location: [facility];  Service: Orthopedics;  Laterality: Left;  . PANCREATECTOMY    . TONSILLECTOMY    . TUBAL LIGATION      There were no vitals filed for this visit.      Subjective Assessment - 02/14/17  1450    Subjective Pt reports a fall last night as well as Tuesday night when transferring from bed to bedside commode.    Pertinent History history of anoxic brain injury (approx 20 years ago), COPD   Limitations Walking;Standing   Patient Stated Goals "I want to walk."    Currently in Pain? Yes   Pain Score 8    Pain Location Generalized   Pain Descriptors / Indicators Aching   Pain Type Acute pain   Pain Onset Yesterday   Pain Frequency Constant   Aggravating Factors  hurts from fall   Pain Relieving Factors rest                         [facility] Adult PT Treatment/Exercise - 02/14/17 0001      Transfers   Transfers Sit to Stand;Stand to Sit;Stand Pivot Transfers   Sit to Stand 5: Supervision   Sit to Stand Details Verbal cues for precautions/safety   Sit to Stand Details (indicate cue type and reason) Cues for hand placement   Stand to Sit 5: Supervision   Stand to Sit Details (indicate cue type and reason) Verbal cues for precautions/safety   Stand to Sit Details Cues for hand placement.    Stand Pivot Transfers 5: Supervision  Stand Pivot Transfer Details (indicate cue type and reason) Blocked practice of stand pivot transfers from simulated bed height to bed side commode.  Performed x 4 reps with cues for hand placement, sequencing and safe management of RW.  Feel that carryover will be poor at home as she is in bedroom by herself with husband down the hall.  Made pt a step by step instruction sheet to hang on walker (provided to CNA to do this once home).  Unsure of how helpful this will be, but note improvement in transfers during sesssion.       Ambulation/Gait   Ambulation/Gait Yes   Ambulation/Gait Assistance 5: Supervision   Ambulation/Gait Assistance Details Continued cues for step length, however did note that she kept RW closer to her today but steps very much shorter.  Pt with difficulty maintaining both larger steps and proper RW positioning.      Ambulation Distance (Feet) 45 Feet  60' x 1   Assistive device Rolling walker   Gait Pattern Step-to pattern;Decreased weight shift to left;Decreased stance time - left;Trunk flexed   Ambulation Surface Level;Indoor     Knee/Hip Exercises: Aerobic   Nustep level 3 x 3 mins (due to pain and time) with BUE/LEs                 PT Education - 02/14/17 1907    Education provided Yes   Education Details See pt instruction on instructions for transfers at night.    Person(s) Educated Patient;Caregiver(s)   Methods Explanation;Demonstration;Handout   Comprehension Verbalized understanding;Returned demonstration;Verbal cues required          PT Short Term Goals - 01/28/17 2137      PT SHORT TERM GOAL #1   Title Patient with caregiver assistance will demonstrate understanding of initial HEP to improve flexibility and balance. (Target Date: 01/21/17)   Baseline met 01-21-17 -- pt reports she is doing HEP at home   Status Achieved     PT [place] #2   Title Will assess BERG and improve score by 3 points in order to indicate decreased fall risk.     Baseline 22/56 baseline and 30/56 on 01/24/17   Status Achieved     PT SHORT TERM GOAL #3   Title Pt will improve 5TSS to <16 seconds with single UE support in order to indicate imrpoved functional strength.     Baseline 13.69 secs with single UE support.    Status Achieved     PT SHORT TERM GOAL #4   Title Pt will improve TUG to <40 secs in order to indicate decreased fall risk.     Baseline 42.90 secs with RW   Status Partially Met     PT SHORT TERM GOAL #5   Title Pt will ambulate x 200' w/ LRAD at S level over indoor surfaces in order to indicate safe home negotiation.    Baseline Pt amb. 115' with RW - reports fatigue after amb. this distance and requests to sit down  - 01-21-17   Status On-going           PT Long Term Goals - 12/24/16 1936      PT LONG TERM GOAL #1   Title Patient demonstrates with caregiver's  cueing progressive HEP correctly. (Target Date: 02/18/17)   Time 8   Period Weeks   Status New     PT LONG TERM GOAL #2   Title Pt will improve BERG balance score by 6 points  from baseline in order to indicate decreased fall risk.     Time 8   Period Weeks   Status New     PT LONG TERM GOAL #3   Title Pt will perform TUG <34 secs in order to indicate decreased fall risk.     Time 8   Period Weeks   Status New     PT LONG TERM GOAL #4   Title Pt will perform 8/10 sit<>stand without UE support in order to indicate improved functional strength.     Time 8   Period Weeks   Status New     PT LONG TERM GOAL #5   Title Pt will ambulate up to 350' over unlevel paved surfaces (including curb/ramp) at S level in order to indicate safe short community negotation.     Time 8   Period Weeks   Status New               Plan - 02/14/17 1908    Clinical Impression Statement Skilled session focused on blocked practice of stand pivot transfers with RW from simulated bed to bedside commode.  Able to perform at S level, but needs marked cuing for sequencing.   Provided handout to hang on walker for carryover.  Also continue to work on gait for quality and endurance.     Rehab Potential Good   Clinical Impairments Affecting Rehab Potential co-morbidities, decreased progress in previous therapies   PT Frequency 2x / week   PT Duration 8 weeks   PT Treatment/Interventions ADLs/Self Care Home Management;DME Instruction;Gait training;Stair training;Functional mobility training;Therapeutic activities;Therapeutic exercise;Balance training;Neuromuscular re-education;Cognitive remediation;Patient/family education;Orthotic Fit/Training;Energy conservation;Vestibular   PT Next Visit Plan begin LTGs   Consulted and Agree with Plan of Care Patient      Patient will benefit from skilled therapeutic intervention in order to improve the following deficits and impairments:  Abnormal gait, Cardiopulmonary  status limiting activity, Decreased activity tolerance, Decreased balance, Decreased cognition, Decreased coordination, Decreased endurance, Decreased knowledge of use of DME, Decreased mobility, Decreased safety awareness, Decreased strength, Impaired perceived functional ability, Impaired flexibility, Impaired UE functional use, Postural dysfunction  Visit Diagnosis: Other abnormalities of gait and mobility  Unsteadiness on feet  Muscle weakness (generalized)     Problem List Patient Active Problem List   Diagnosis Date Noted  . Irregular heart beat 11/15/2016  . Urinary tract infection without hematuria   . Severe sepsis ([place]) 11/08/2016  . Leukocytosis 07/31/2016  . Community acquired pneumonia   . Hyperglycemia   . Acute respiratory failure with hypoxia ([place])   . Acute urinary retention   . Elevated troponin   . Sepsis ([place]) 07/18/2016  . Lobar pneumonia, unspecified organism ([place]) 07/18/2016  . Pharyngitis 07/18/2016  . Elevated troponin I level 07/18/2016  . Oral thrush 07/18/2016  . Odynophagia 07/18/2016  . Anoxic brain injury ([place]) 12/29/2015  . Gait disorder 12/28/2015  . Well adult exam 12/08/2014  . Fracture of left humerus 06/24/2014  . Humerus fracture 06/24/2014  . Chest wall contusion 05/03/2014  . Acute bronchitis 11/10/2013  . Left elbow pain 11/03/2013  . Right facial pain 06/19/2013  . Ankle ulcer ([place]) 04/02/2012  . Nausea & vomiting 10/05/2011  . Decubitus ulcer 06/26/2011  . Decubitus ulcer of coccyx 05/21/2011  . Cellulitis and abscess of other specified site 10/11/2010  . B12 deficiency 04/04/2010  . HYPERLIPIDEMIA 04/04/2010  . Anxiety state 11/14/2009  . Vitamin D deficiency 10/21/2009  . MELENA 10/21/2009  . DM2 (diabetes mellitus,  type 2) ([place]) 07/07/2009  . FEVER, RECURRENT 06/21/2009  . COPD exacerbation ([place]) 02/25/2009  . OSTEOARTHRITIS 02/25/2009  . LOW BACK PAIN 02/25/2009  . TOBACCO USER 01/13/2009  . Rash and other  nonspecific skin eruption 01/13/2009  . FURUNCLE 06/08/2008  . DEPRESSION 02/26/2007  . PAIN, CHRONIC NEC 02/26/2007  . Venous (peripheral) insufficiency 02/26/2007  . GERD 02/26/2007  . HERNIA, INCISIONAL 02/26/2007  . SHOULDER PAIN, LEFT 02/26/2007  . 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 INSOMNIA 02/26/2007  . Abnormality of gait 02/26/2007  . LEG EDEMA, CHRONIC 02/26/2007    Cameron Sprang, PT, MPT [facility] [address] [place], [place], [address] Phone: [phone]   Fax:  [phone] 02/14/17, 7:12 PM  Name: LYLA JASEK MRN: [id number] Date of Birth: 1951/10/01

## 2017-02-17 ENCOUNTER — Other Ambulatory Visit: Payer: Self-pay | Admitting: Internal Medicine

## 2017-02-18 ENCOUNTER — Ambulatory Visit: Payer: Medicare Other | Admitting: Rehabilitation

## 2017-02-18 ENCOUNTER — Encounter: Payer: Self-pay | Admitting: Rehabilitation

## 2017-02-18 DIAGNOSIS — R269 Unspecified abnormalities of gait and mobility: Secondary | ICD-10-CM

## 2017-02-18 DIAGNOSIS — R2689 Other abnormalities of gait and mobility: Secondary | ICD-10-CM

## 2017-02-18 DIAGNOSIS — M6281 Muscle weakness (generalized): Secondary | ICD-10-CM

## 2017-02-18 DIAGNOSIS — R2681 Unsteadiness on feet: Secondary | ICD-10-CM | POA: Diagnosis not present

## 2017-02-18 NOTE — Patient Instructions (Addendum)
Do these in bed.     Bracing With Bridging (Hook-Lying)    With neutral spine, tighten pelvic floor and abdominals and hold. Lift bottom. Repeat __10_ times. Do __1_ times a day.  Have her count out loud so that we know she is breathing.     Copyright  VHI. All rights reserved.   Hip Flexion / Knee Extension: Straight-Leg Raise (Eccentric)    Lie on back. Lift leg with knee straight. Slowly lower leg for 3-5 seconds. _10__ reps per set, _1_ sets per day, __5-7_ days per week. Lower like elevator, stopping at each floor.   Copyright  VHI. All rights reserved.    EXTENSION: Standing (Active)    Stand, both feet flat. Draw right leg behind body as far as possible.  Complete _1__ sets of _10__ repetitions. Perform _1-2__ sessions per day.  http://gtsc.exer.us/77   Copyright  VHI. All rights reserved.   ABDUCTION: Standing (Active)    Stand, feet flat. Lift right leg out to side.  Complete _1_ sets of _10__ repetitions. Perform _1-2__ sessions per day.  http://gtsc.exer.us/111   Copyright  VHI. All rights reserved.   Hip Flexor Stretch    Lying on back near edge of bed, bend one leg, foot flat. Hang other leg over edge, relaxed, thigh resting entirely on bed for __2__ minutes. Repeat __2__ times. Do _1-2___ sessions per day.   http://gt2.exer.us/347   Copyright  VHI. All rights reserved.

## 2017-02-18 NOTE — Therapy (Signed)
[facility] [address] [place], [place], [address] Phone: [phone]   Fax:  [phone]  Physical Therapy Treatment  Patient Details  Name: [name] MRN: [id number] Date of Birth: Feb 01, 1951 Referring Provider: Lew Jackson  Encounter Date: 02/18/2017      PT End of Session - 02/18/17 1804    Visit Number 16  G2   Number of Visits 17   Date for PT Re-Evaluation 02/22/17   Authorization Type MCR primary, MCD secondary   PT Start Time 1447   PT Stop Time 1531   PT Time Calculation (min) 44 min   Activity Tolerance Patient tolerated treatment well   Behavior During Therapy [facility] for tasks assessed/performed      Past Medical History:  Diagnosis Date  . Anoxic brain injury ([place])   . Anxiety   . Chronic neck pain   . Chronic pain in shoulder   . COPD (chronic obstructive pulmonary disease) ([place])   . Depression   . Fracture of left humerus 06/24/2014  . Gait disorder   . GERD (gastroesophageal reflux disease)   . Hernia of abdominal cavity   . 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 Hyperlipidemia   . LBP (low back pain)   . Neuropathy   . Osteoarthritis   . Seizure disorder ([place])   . Seizures ([place])   . Type II or unspecified type diabetes mellitus without mention of complication, not stated as uncontrolled 2010  . Vitamin B 12 deficiency 2011    Past Surgical History:  Procedure Laterality Date  . COLONOSCOPY    . DIAGNOSTIC LAPAROSCOPY     PMH: Exploratory lap  . HERNIA REPAIR     incisional  . ORIF HUMERUS FRACTURE Left 06/24/2014   Procedure: LEFT OPEN REDUCTION INTERNAL FIXATION (ORIF) PROXIMAL HUMERUS FRACTURE;  Surgeon: Johnny Bridge, MD;  Location: [place];  Service: Orthopedics;  Laterality: Left;  . PANCREATECTOMY    . TONSILLECTOMY    . TUBAL LIGATION      There were no vitals filed for this visit.      Subjective Assessment - 02/18/17  1451    Subjective Pt reports no changes, no falls since last visit.    Patient is accompained by: Family member  Advertising copywriter, CNA   Pertinent History history of anoxic brain injury (approx 20 years ago), COPD   Limitations Walking;Standing   Patient Stated Goals "I want to walk."    Currently in Pain? Yes   Pain Score 9    Pain Location Back   Pain Orientation Lower   Pain Descriptors / Indicators Aching   Pain Type Chronic pain   Pain Onset More than a month ago   Pain Frequency Constant   Aggravating Factors  walking   Pain Relieving Factors rest                          [facility] Adult PT Treatment/Exercise - 02/18/17 0001      Transfers   Transfers Sit to Stand;Stand to Sit;Stand Pivot Transfers   Sit to Stand 6: Modified independent (Device/Increase time)   Five time sit to stand comments  15.56 secs with single UE support   Stand to Sit 6: Modified independent (Device/Increase time)     Standardized Balance Assessment   Standardized Balance Assessment Timed Up and Go Test     Timed Up and Go Test   TUG  Normal TUG   Normal TUG (seconds) 39.48     Exercises   Other Exercises  Went over current HEP for STGs.  Pt requires heavy cues to recall and continue to feel that she needs S/cues from caregiver/spouse for carryover.  See pt instruction for details on exercises and reps performed.                  PT Education - 02/18/17 1453    Education provided Yes   Education Details importance of compliance with HEP    Person(s) Educated Patient   Methods Explanation   Comprehension Verbalized understanding          PT Short Term Goals - 01/28/17 2137      PT SHORT TERM GOAL #1   Title Patient with caregiver assistance will demonstrate understanding of initial HEP to improve flexibility and balance. (Target Date: 01/21/17)   Baseline met 01-21-17 -- pt reports she is doing HEP at home   Status Achieved     PT [place] #2   Title Will assess  BERG and improve score by 3 points in order to indicate decreased fall risk.     Baseline 22/56 baseline and 30/56 on 01/24/17   Status Achieved     PT SHORT TERM GOAL #3   Title Pt will improve 5TSS to <16 seconds with single UE support in order to indicate imrpoved functional strength.     Baseline 13.69 secs with single UE support.    Status Achieved     PT SHORT TERM GOAL #4   Title Pt will improve TUG to <40 secs in order to indicate decreased fall risk.     Baseline 42.90 secs with RW   Status Partially Met     PT SHORT TERM GOAL #5   Title Pt will ambulate x 200' w/ LRAD at S level over indoor surfaces in order to indicate safe home negotiation.    Baseline Pt amb. 115' with RW - reports fatigue after amb. this distance and requests to sit down  - 01-21-17   Status On-going           PT Long Term Goals - 02/18/17 1517      PT LONG TERM GOAL #1   Title Patient demonstrates with caregiver's cueing progressive HEP correctly. (Target Date: 02/18/17)   Baseline met 02/18/17   Time 8   Period Weeks   Status Achieved     PT LONG TERM GOAL #2   Title Pt will improve BERG balance score by 6 points from baseline in order to indicate decreased fall risk.     Time 8   Period Weeks   Status New     PT LONG TERM GOAL #3   Title Pt will perform TUG <34 secs in order to indicate decreased fall risk.     Baseline 39.68 secs with RW   Time 8   Period Weeks   Status Partially Met     PT LONG TERM GOAL #4   Title Pt will perform 8/10 sit<>stand without UE support in order to indicate improved functional strength.     Baseline met 02/18/17 with max cues but could perform with single UE on lap   Time 8   Period Weeks   Status Achieved     PT LONG TERM GOAL #5   Title Pt will ambulate up to 350' over unlevel paved surfaces (including curb/ramp) at S level in order to indicate safe short  community negotation.     Time 8   Period Weeks   Status New               Plan -  02/18/17 1805    Clinical Impression Statement Skilled session beginning to look at [name].  She has met goal for 5TSS and HEP, partially meeting TUG goal.  Pt requires heavy cues but has shown improved gait pattern within sessions.  Will plan to check remaining LTGs and D/C on next visit.  Pt verbalized understanding.    Rehab Potential Good   Clinical Impairments Affecting Rehab Potential co-morbidities, decreased progress in previous therapies   PT Frequency 2x / week   PT Duration 8 weeks   PT Treatment/Interventions ADLs/Self Care Home Management;DME Instruction;Gait training;Stair training;Functional mobility training;Therapeutic activities;Therapeutic exercise;Balance training;Neuromuscular re-education;Cognitive remediation;Patient/family education;Orthotic Fit/Training;Energy conservation;Vestibular   PT Next Visit Plan LTGs and D/C   Consulted and Agree with Plan of Care Patient      Patient will benefit from skilled therapeutic intervention in order to improve the following deficits and impairments:  Abnormal gait, Cardiopulmonary status limiting activity, Decreased activity tolerance, Decreased balance, Decreased cognition, Decreased coordination, Decreased endurance, Decreased knowledge of use of DME, Decreased mobility, Decreased safety awareness, Decreased strength, Impaired perceived functional ability, Impaired flexibility, Impaired UE functional use, Postural dysfunction  Visit Diagnosis: Other abnormalities of gait and mobility  Unsteadiness on feet  Muscle weakness (generalized)  Abnormality of gait     Problem List Patient Active Problem List   Diagnosis Date Noted  . Irregular heart beat 11/15/2016  . Urinary tract infection without hematuria   . Severe sepsis ([place]) 11/08/2016  . Leukocytosis 07/31/2016  . Community acquired pneumonia   . Hyperglycemia   . Acute respiratory failure with hypoxia ([place])   . Acute urinary retention   . Elevated troponin   .  Sepsis ([place]) 07/18/2016  . Lobar pneumonia, unspecified organism ([place]) 07/18/2016  . Pharyngitis 07/18/2016  . Elevated troponin I level 07/18/2016  . Oral thrush 07/18/2016  . Odynophagia 07/18/2016  . Anoxic brain injury ([place]) 12/29/2015  . Gait disorder 12/28/2015  . Well adult exam 12/08/2014  . Fracture of left humerus 06/24/2014  . Humerus fracture 06/24/2014  . Chest wall contusion 05/03/2014  . Acute bronchitis 11/10/2013  . Left elbow pain 11/03/2013  . Right facial pain 06/19/2013  . Ankle ulcer ([place]) 04/02/2012  . Nausea & vomiting 10/05/2011  . Decubitus ulcer 06/26/2011  . Decubitus ulcer of coccyx 05/21/2011  . Cellulitis and abscess of other specified site 10/11/2010  . B12 deficiency 04/04/2010  . HYPERLIPIDEMIA 04/04/2010  . Anxiety state 11/14/2009  . Vitamin D deficiency 10/21/2009  . MELENA 10/21/2009  . DM2 (diabetes mellitus, type 2) ([place]) 07/07/2009  . FEVER, RECURRENT 06/21/2009  . COPD exacerbation ([place]) 02/25/2009  . OSTEOARTHRITIS 02/25/2009  . LOW BACK PAIN 02/25/2009  . TOBACCO USER 01/13/2009  . Rash and other nonspecific skin eruption 01/13/2009  . FURUNCLE 06/08/2008  . DEPRESSION 02/26/2007  . PAIN, CHRONIC NEC 02/26/2007  . Venous (peripheral) insufficiency 02/26/2007  . GERD 02/26/2007  . HERNIA, INCISIONAL 02/26/2007  . SHOULDER PAIN, LEFT 02/26/2007  . 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 INSOMNIA 02/26/2007  . Abnormality of gait 02/26/2007  . LEG EDEMA, CHRONIC 02/26/2007    Cameron Sprang, PT, MPT [facility] [address] [place], [place], [address] Phone: [phone]   Fax:  [phone] 02/18/17, 6:08 PM  Name:  [name] MRN: [id number] Date of Birth: [birth year]

## 2017-02-20 NOTE — Telephone Encounter (Signed)
Needs OV.  

## 2017-02-20 NOTE — Telephone Encounter (Signed)
Called refill into [facility] had to leave on pharmacy vm../lmb 

## 2017-02-21 ENCOUNTER — Ambulatory Visit: Payer: Medicare Other | Admitting: Rehabilitation

## 2017-02-21 ENCOUNTER — Encounter: Payer: Self-pay | Admitting: Rehabilitation

## 2017-02-21 DIAGNOSIS — R269 Unspecified abnormalities of gait and mobility: Secondary | ICD-10-CM | POA: Diagnosis not present

## 2017-02-21 DIAGNOSIS — R2689 Other abnormalities of gait and mobility: Secondary | ICD-10-CM | POA: Diagnosis not present

## 2017-02-21 DIAGNOSIS — M6281 Muscle weakness (generalized): Secondary | ICD-10-CM | POA: Diagnosis not present

## 2017-02-21 DIAGNOSIS — R2681 Unsteadiness on feet: Secondary | ICD-10-CM

## 2017-02-21 NOTE — Therapy (Signed)
[facility] [address] [place], [place], [address] Phone: [phone]   Fax:  [phone]  Physical Therapy Treatment and D/C Summary   Patient Details  Name: [name] MRN: [id number] Date of Birth: [birth year] Referring Provider: Lew Dawes  Encounter Date: 02/21/2017      PT End of Session - 02/21/17 2005    Visit Number 17  G2   Number of Visits 17   Date for PT Re-Evaluation 02/22/17   Authorization Type MCR primary, MCD secondary   PT Start Time 1447   PT Stop Time 1532   PT Time Calculation (min) 45 min   Activity Tolerance Patient tolerated treatment well   Behavior During Therapy [facility] for tasks assessed/performed      Past Medical History:  Diagnosis Date  . Anoxic brain injury ([place])   . Anxiety   . Chronic neck pain   . Chronic pain in shoulder   . COPD (chronic obstructive pulmonary disease) ([place])   . Depression   . Fracture of left humerus 06/24/2014  . Gait disorder   . GERD (gastroesophageal reflux disease)   . Hernia of abdominal cavity   . 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 Hyperlipidemia   . LBP (low back pain)   . Neuropathy   . Osteoarthritis   . Seizure disorder ([place])   . Seizures ([place])   . Type II or unspecified type diabetes mellitus without mention of complication, not stated as uncontrolled 2010  . Vitamin B 12 deficiency 2011    Past Surgical History:  Procedure Laterality Date  . COLONOSCOPY    . DIAGNOSTIC LAPAROSCOPY     PMH: Exploratory lap  . HERNIA REPAIR     incisional  . ORIF HUMERUS FRACTURE Left 06/24/2014   Procedure: LEFT OPEN REDUCTION INTERNAL FIXATION (ORIF) PROXIMAL HUMERUS FRACTURE;  Surgeon: Johnny Bridge, MD;  Location: [place];  Service: Orthopedics;  Laterality: Left;  . PANCREATECTOMY    . TONSILLECTOMY    . TUBAL LIGATION      There were no vitals filed for this visit.      Subjective  Assessment - 02/21/17 1454    Subjective "My neck is really bothering me today."    Patient is accompained by: Family member   Pertinent History history of anoxic brain injury (approx 20 years ago), COPD   Limitations Walking;Standing   Patient Stated Goals "I want to walk."    Currently in Pain? Yes   Pain Score 9    Pain Location Back   Pain Orientation Lower;Upper   Pain Descriptors / Indicators Aching   Pain Type Chronic pain   Pain Onset More than a month ago   Pain Frequency Constant   Aggravating Factors  walking   Pain Relieving Factors rest                         [facility] Adult PT Treatment/Exercise - 02/21/17 1500      Ambulation/Gait   Ambulation/Gait Yes   Ambulation/Gait Assistance 5: Supervision   Ambulation/Gait Assistance Details Assessed outdoor gait for LTG.  Pt able to ambulate up to 200' (both indoor/outdoor) with RW before needing seated rest break outdoors.  Pt able to ambulate at close S level, however continues to require HEAVY MAX cues for safety with RW esp when making turns.     Ambulation Distance (Feet) 200 Feet  x 2 reps  Assistive device Rolling walker   Gait Pattern Step-to pattern;Decreased weight shift to left;Decreased stance time - left;Trunk flexed   Ambulation Surface Level;Unlevel;Indoor;Outdoor;Paved     Standardized Balance Assessment   Standardized Balance Assessment Berg Balance Test     Berg Balance Test   Sit to Stand Able to stand  independently using hands   Standing Unsupported Able to stand safely 2 minutes   Sitting with Back Unsupported but Feet Supported on Floor or Stool Able to sit safely and securely 2 minutes   Stand to Sit Sits safely with minimal use of hands   Transfers Able to transfer safely, definite need of hands   Standing Unsupported with Eyes Closed Able to stand 10 seconds with supervision   Standing Ubsupported with Feet Together Needs help to attain position but able to stand for 30 seconds  with feet together   From Standing, Reach Forward with Outstretched Arm Can reach forward >5 cm safely (2")   From Standing Position, Pick up Object from Floor Able to pick up shoe, needs supervision   From Standing Position, Turn to Look Behind Over each Shoulder Needs supervision when turning   Turn 360 Degrees Needs assistance while turning   Standing Unsupported, Alternately Place Feet on Step/Stool Needs assistance to keep from falling or unable to try   Standing Unsupported, One Foot in Front Needs help to step but can hold 15 seconds   Standing on One Leg Tries to lift leg/unable to hold 3 seconds but remains standing independently   Total Score 30                PT Education - 02/21/17 2005    Education provided Yes   Education Details re-printed HEP for improved compliance per pt request.    Person(s) Educated Patient;Caregiver(s)   Methods Explanation;Handout   Comprehension Verbalized understanding          PT Short Term Goals - 01/28/17 2137      PT SHORT TERM GOAL #1   Title Patient with caregiver assistance will demonstrate understanding of initial HEP to improve flexibility and balance. (Target Date: 01/21/17)   Baseline met 01-21-17 -- pt reports she is doing HEP at home   Status Achieved     PT [address] #2   Title Will assess BERG and improve score by 3 points in order to indicate decreased fall risk.     Baseline 22/56 baseline and 30/56 on 01/24/17   Status Achieved     PT SHORT TERM GOAL #3   Title Pt will improve 5TSS to <16 seconds with single UE support in order to indicate imrpoved functional strength.     Baseline 13.69 secs with single UE support.    Status Achieved     PT SHORT TERM GOAL #4   Title Pt will improve TUG to <40 secs in order to indicate decreased fall risk.     Baseline 42.90 secs with RW   Status Partially Met     PT SHORT TERM GOAL #5   Title Pt will ambulate x 200' w/ LRAD at S level over indoor surfaces in order to  indicate safe home negotiation.    Baseline Pt amb. 115' with RW - reports fatigue after amb. this distance and requests to sit down  - 01-21-17   Status On-going           PT Long Term Goals - 02/21/17 1455      PT LONG TERM GOAL #  1   Title Patient demonstrates with caregiver's cueing progressive HEP correctly. (Target Date: 02/18/17)   Baseline met 02/18/17   Time 8   Period Weeks   Status Achieved     PT LONG TERM GOAL #2   Title Pt will improve BERG balance score by 6 points from baseline in order to indicate decreased fall risk.     Baseline 30/56 on 2017/03/20, met at STG session, however has not made progress since half way mark.     Time 8   Period Weeks   Status Achieved     PT LONG TERM GOAL #3   Title Pt will perform TUG <34 secs in order to indicate decreased fall risk.     Baseline 39.68 secs with RW   Time 8   Period Weeks   Status Partially Met     PT LONG TERM GOAL #4   Title Pt will perform 8/10 sit<>stand without UE support in order to indicate improved functional strength.     Baseline met 02/18/17 with max cues but could perform with single UE on lap   Time 8   Period Weeks   Status Achieved     PT LONG TERM GOAL #5   Title Pt will ambulate up to 350' over unlevel paved surfaces (including curb/ramp) at S level in order to indicate safe short community negotation.     Baseline pt only able to ambulate up to 200' with max verbal cues   Time 8   Period Weeks   Status Partially Met               Plan - Mar 20, 2017 2006    Clinical Impression Statement Session focused on asssessment of LTG and D/C from therapy.  Note that she had met BERG goal at half way point, however since then has remained at a 30/56 continuing to indicate a significant fall risk.  Pt with a plateau in progress and therefore will D/C at this time. Pt verbalize understanding.     Rehab Potential Good   Clinical Impairments Affecting Rehab Potential co-morbidities, decreased progress  in previous therapies   PT Frequency 2x / week   PT Duration 8 weeks   PT Treatment/Interventions ADLs/Self Care Home Management;DME Instruction;Gait training;Stair training;Functional mobility training;Therapeutic activities;Therapeutic exercise;Balance training;Neuromuscular re-education;Cognitive remediation;Patient/family education;Orthotic Fit/Training;Energy conservation;Vestibular   Consulted and Agree with Plan of Care Patient      Patient will benefit from skilled therapeutic intervention in order to improve the following deficits and impairments:  Abnormal gait, Cardiopulmonary status limiting activity, Decreased activity tolerance, Decreased balance, Decreased cognition, Decreased coordination, Decreased endurance, Decreased knowledge of use of DME, Decreased mobility, Decreased safety awareness, Decreased strength, Impaired perceived functional ability, Impaired flexibility, Impaired UE functional use, Postural dysfunction  Visit Diagnosis: Other abnormalities of gait and mobility  Unsteadiness on feet  Muscle weakness (generalized)       G-Codes - Mar 20, 2017 25-Dec-2009    Functional Assessment Tool Used (Outpatient Only) BERG: 30/56   Functional Limitation Mobility: Walking and moving around   Mobility: Walking and Moving Around Current Status [phone]) At least 40 percent but less than 60 percent impaired, limited or restricted   Mobility: Walking and Moving Around Goal Status [phone]) At least 20 percent but less than 40 percent impaired, limited or restricted   Mobility: Walking and Moving Around Discharge Status [phone]) At least 40 percent but less than 60 percent impaired, limited or restricted       PHYSICAL THERAPY DISCHARGE SUMMARY  Visits from Start of Care: 17  Current functional level related to goals / functional outcomes: See LTGs above   Remaining deficits: Continues to have decreased balance and significant gait abnormalities, however due to cognitive deficits,  has made plateau with therapy.     Education / Equipment: HEP  Plan: Patient agrees to discharge.  Patient goals were partially met. Patient is being discharged due to meeting the stated rehab goals.  ?????       Problem List Patient Active Problem List   Diagnosis Date Noted  . Irregular heart beat 11/15/2016  . Urinary tract infection without hematuria   . Severe sepsis ([place]) 11/08/2016  . Leukocytosis 07/31/2016  . Community acquired pneumonia   . Hyperglycemia   . Acute respiratory failure with hypoxia ([place])   . Acute urinary retention   . Elevated troponin   . Sepsis ([place]) 07/18/2016  . Lobar pneumonia, unspecified organism ([place]) 07/18/2016  . Pharyngitis 07/18/2016  . Elevated troponin I level 07/18/2016  . Oral thrush 07/18/2016  . Odynophagia 07/18/2016  . Anoxic brain injury ([place]) 12/29/2015  . Gait disorder 12/28/2015  . Well adult exam 12/08/2014  . Fracture of left humerus 06/24/2014  . Humerus fracture 06/24/2014  . Chest wall contusion 05/03/2014  . Acute bronchitis 11/10/2013  . Left elbow pain 11/03/2013  . Right facial pain 06/19/2013  . Ankle ulcer ([place]) 04/02/2012  . Nausea & vomiting 10/05/2011  . Decubitus ulcer 06/26/2011  . Decubitus ulcer of coccyx 05/21/2011  . Cellulitis and abscess of other specified site 10/11/2010  . B12 deficiency 04/04/2010  . HYPERLIPIDEMIA 04/04/2010  . Anxiety state 11/14/2009  . Vitamin D deficiency 10/21/2009  . MELENA 10/21/2009  . DM2 (diabetes mellitus, type 2) ([place]) 07/07/2009  . FEVER, RECURRENT 06/21/2009  . COPD exacerbation ([place]) 02/25/2009  . OSTEOARTHRITIS 02/25/2009  . LOW BACK PAIN 02/25/2009  . TOBACCO USER 01/13/2009  . Rash and other nonspecific skin eruption 01/13/2009  . FURUNCLE 06/08/2008  . DEPRESSION 02/26/2007  . PAIN, CHRONIC NEC 02/26/2007  . Venous (peripheral) insufficiency 02/26/2007  . GERD 02/26/2007  . HERNIA, INCISIONAL 02/26/2007  . SHOULDER PAIN, LEFT 02/26/2007  .  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 INSOMNIA 02/26/2007  . Abnormality of gait 02/26/2007  . LEG EDEMA, CHRONIC 02/26/2007    Cameron Sprang, PT, MPT [facility] [address] [place], [place], [address] Phone: [phone]   Fax:  [phone] 02/21/17, 8:16 PM  Name: [name] MRN: [id number] Date of Birth: Sep 07, 1951

## 2017-02-21 NOTE — Patient Instructions (Signed)
Do these in bed.    Bracing With Bridging (Hook-Lying)    With neutral spine, tighten pelvic floor and abdominals and hold. Lift bottom. Repeat __10_ times. Do __1_ times a day. Have her count out loud so that we know she is breathing.    Copyright  VHI. All rights reserved.  Hip Flexion / Knee Extension: Straight-Leg Raise (Eccentric)    Lie on back. Lift leg with knee straight. Slowly lower leg for 3-5 seconds. _10__ reps per set, _1_ sets per day, __5-7_ days per week. Lower like elevator, stopping at each floor.   Copyright  VHI. All rights reserved.   EXTENSION: Standing (Active)    Stand, both feet flat. Draw right leg behind body as far as possible.  Complete _1__ sets of _10__ repetitions. Perform _1-2__ sessions per day.  http://gtsc.exer.us/77  Copyright  VHI. All rights reserved.  ABDUCTION: Standing (Active)    Stand, feet flat. Lift right leg out to side.  Complete _1_ sets of _10__ repetitions. Perform _1-2__ sessions per day.  http://gtsc.exer.us/111  Copyright  VHI. All rights reserved.  Hip Flexor Stretch    Lying on back near edge of bed, bend one leg, foot flat. Hang other leg over edge, relaxed, thigh resting entirely on bed for __2__ minutes. Repeat __2__ times. Do _1-2___ sessions per day.   http://gt2.exer.us/347   Copyright  VHI. All rights reserved.

## 2017-03-08 DIAGNOSIS — L603 Nail dystrophy: Secondary | ICD-10-CM | POA: Diagnosis not present

## 2017-03-08 DIAGNOSIS — I739 Peripheral vascular disease, unspecified: Secondary | ICD-10-CM | POA: Diagnosis not present

## 2017-03-08 DIAGNOSIS — E1151 Type 2 diabetes mellitus with diabetic peripheral angiopathy without gangrene: Secondary | ICD-10-CM | POA: Diagnosis not present

## 2017-03-14 DIAGNOSIS — M47816 Spondylosis without myelopathy or radiculopathy, lumbar region: Secondary | ICD-10-CM | POA: Diagnosis not present

## 2017-03-14 DIAGNOSIS — Z79891 Long term (current) use of opiate analgesic: Secondary | ICD-10-CM | POA: Diagnosis not present

## 2017-03-14 DIAGNOSIS — G894 Chronic pain syndrome: Secondary | ICD-10-CM | POA: Diagnosis not present

## 2017-03-22 ENCOUNTER — Telehealth: Payer: Self-pay

## 2017-03-22 DIAGNOSIS — M810 Age-related osteoporosis without current pathological fracture: Secondary | ICD-10-CM

## 2017-03-22 MED ORDER — DENOSUMAB 60 MG/ML ~~LOC~~ SOLN: 60.0000 mg | Freq: Once | SUBCUTANEOUS | 0 refills | Status: AC

## 2017-03-22 NOTE — Telephone Encounter (Signed)
Pharmacy called and wanted to let you know that there is NO CHARGE for the prolia.

## 2017-03-22 NOTE — Telephone Encounter (Signed)
Patient is ok to get prolia injection at her earliest convenience, she is enrolled in prolia co-pay program---patient comes in to get injection with $0 copay---when patient receives EOB in mail, she brings [name] that copy of EOB and [name] will fax it to [employer],---[employer] loads patient's card with appropriate amt of money to pay for injection and patient will pay [facility] bill with that card---can talk with [name] if any further questions

## 2017-03-22 NOTE — Telephone Encounter (Signed)
[facility] to hopefully lower cost of [facility]lia to patient---[facility] under [facility]-eligibilty medicare/medicaid and call our office back with price of prolia for patient if picked up at the pharm---I have placed note to pharmacist asking them to call [name] with cost

## 2017-03-22 NOTE — Telephone Encounter (Signed)
Patient advised that specialty pharmacy will contact her for pick up at [facility]---hopefully, prolia will be available for patient to pick up and bring to office visit with dr Posey Reaplotnikov on 6/28

## 2017-03-28 ENCOUNTER — Ambulatory Visit: Payer: Medicare Other | Admitting: Internal Medicine

## 2017-04-01 IMAGING — DX DG CHEST 2V
2 series · 2 of 2 positions shown · non-contrast
Comparison: Portable chest x-ray July 19, 2016 and CT scan of
the chest dated July 16, 2016.

CLINICAL DATA: Follow-up of pneumonia.  No current complaints.

EXAM:
CHEST  2 VIEW

[chest pa]
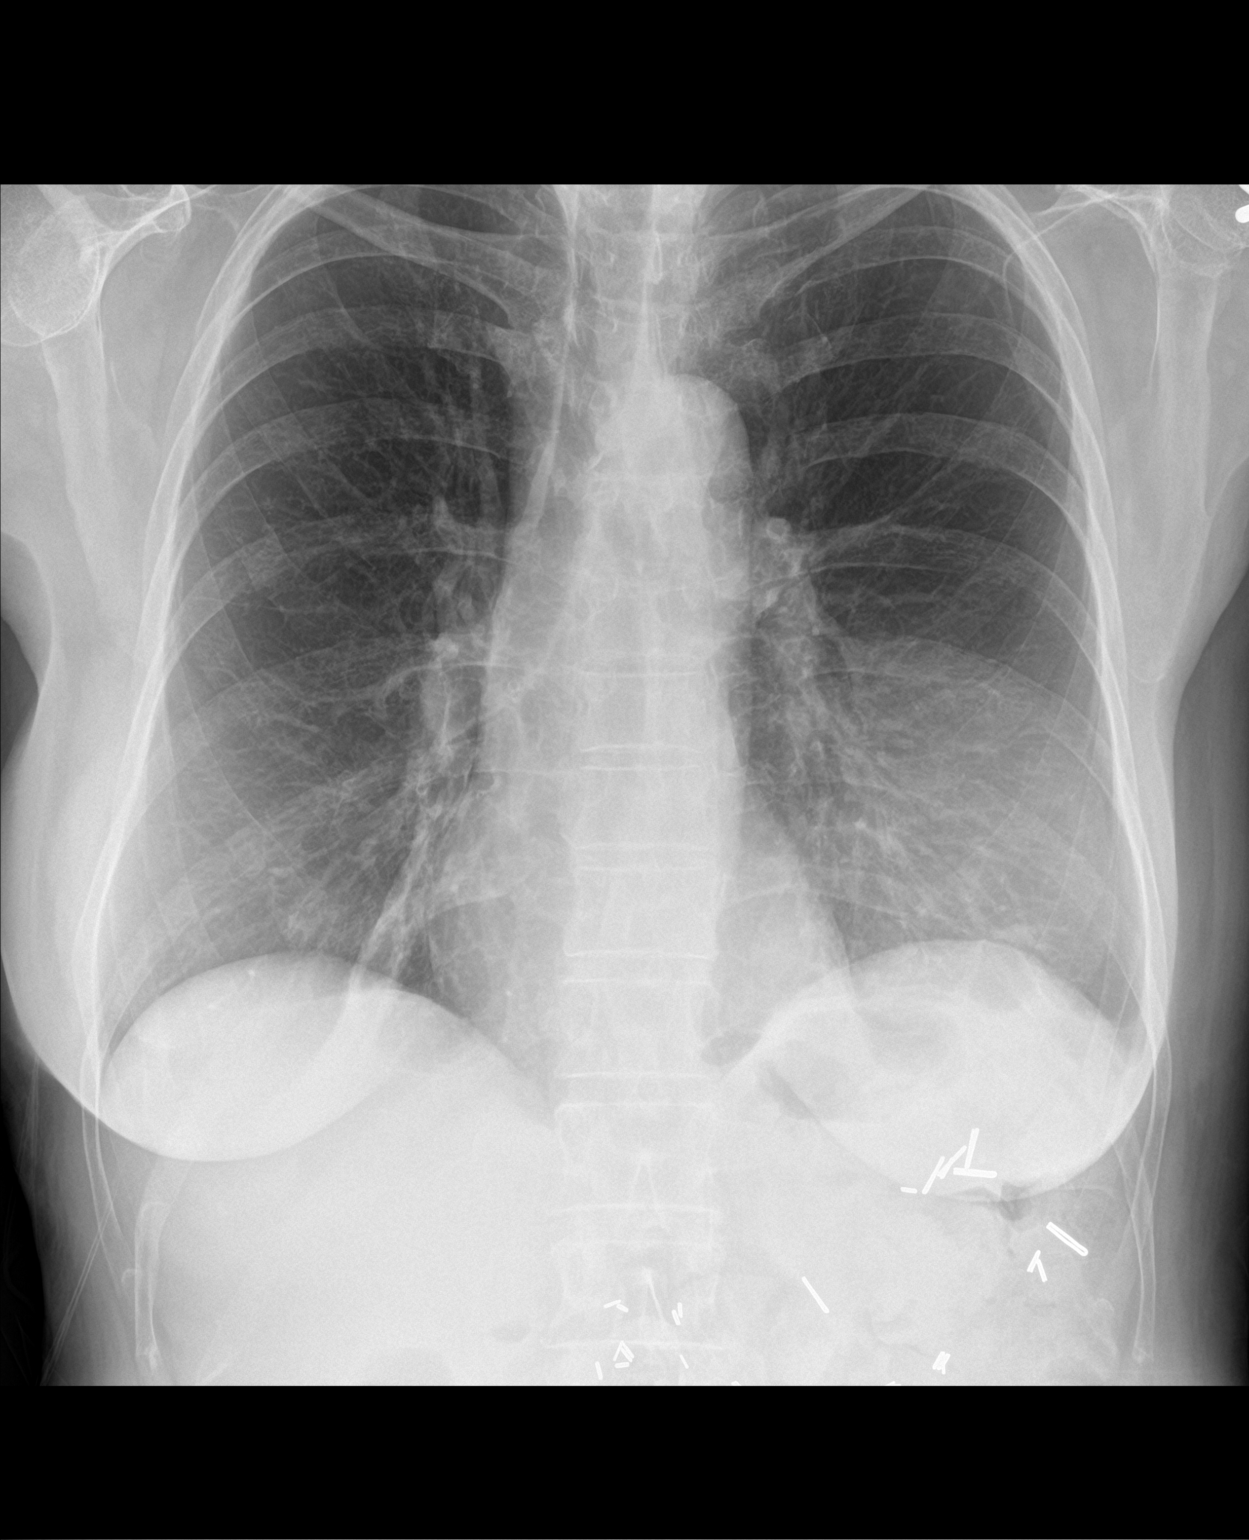

[chest lat]
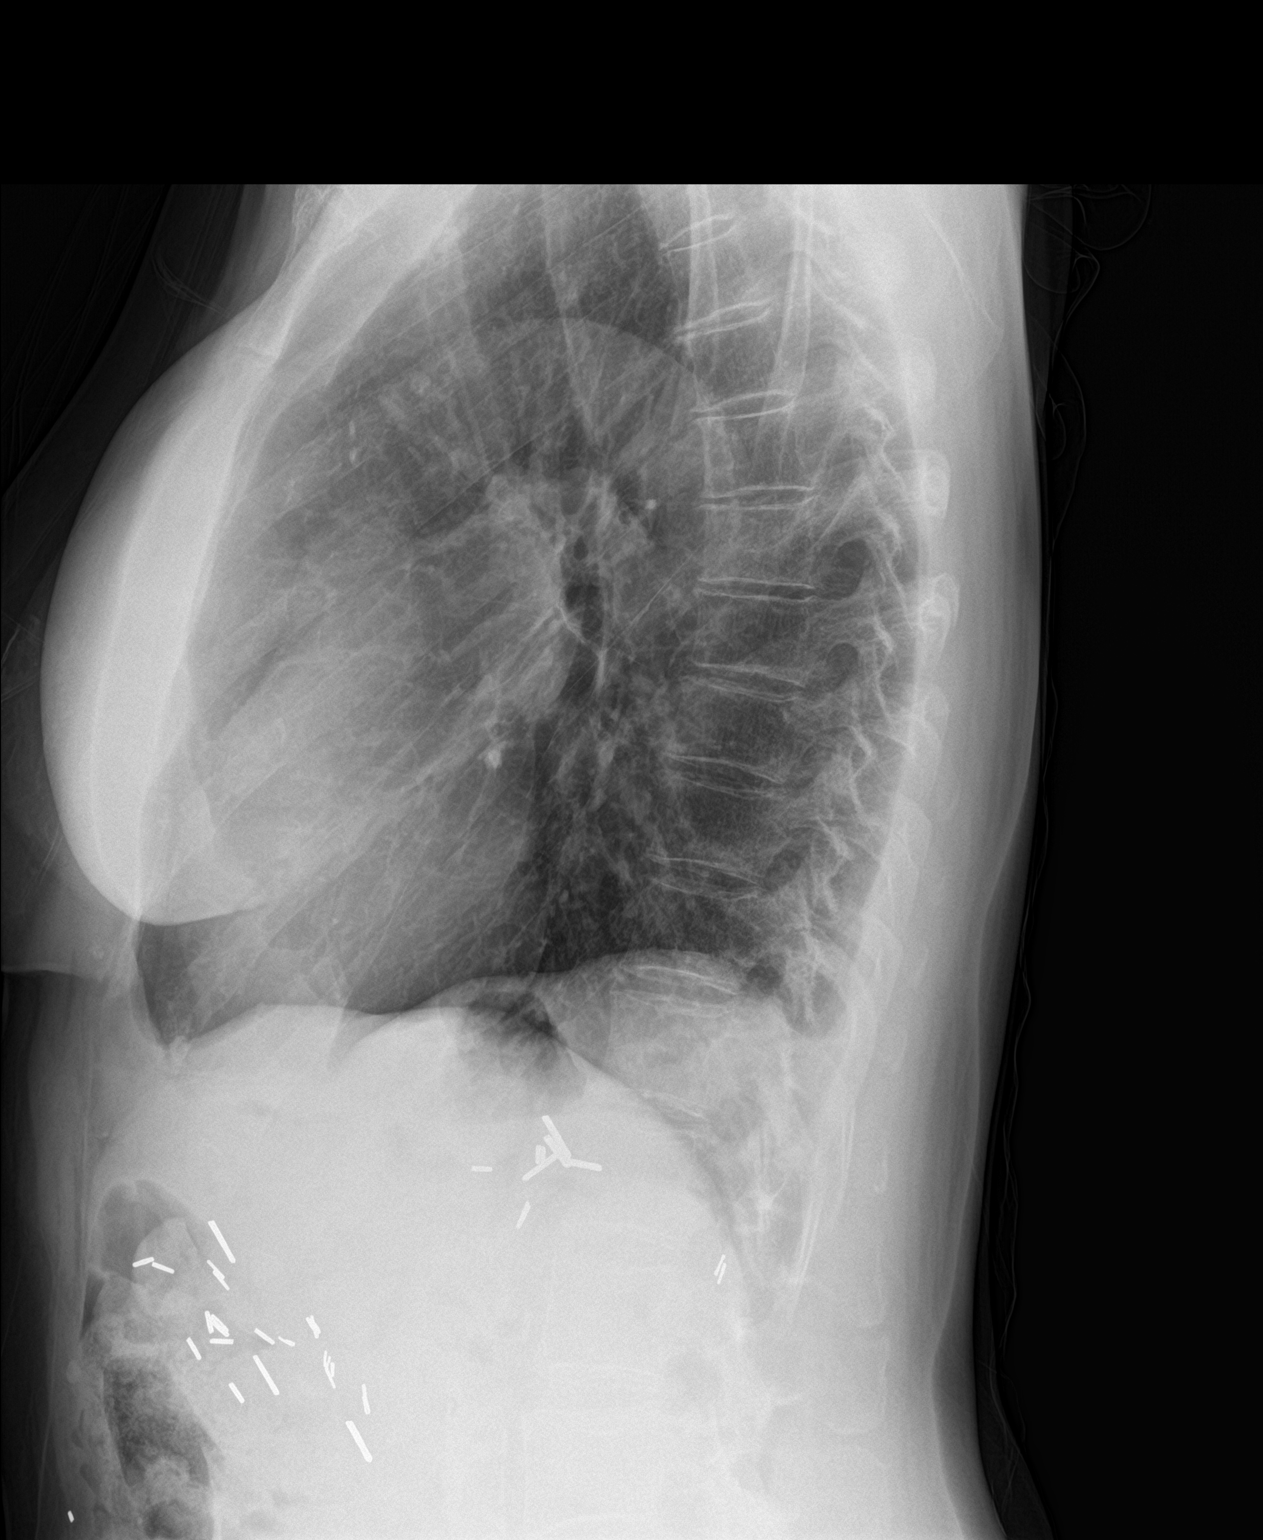

[2 of 2 positions shown; findings below may reference images not displayed]

FINDINGS: The lungs are well expanded. There is linear density in the
infrahilar region on the right which likely reflects atelectasis or
scarring. There is no pleural effusion. The heart and pulmonary
vascularity are normal. There is calcification in the wall of the
aortic arch. The bony thorax exhibits no acute abnormality.
IMPRESSION: Residual atelectasis or infiltrate the in the right infrahilar
region likely in the lower lobe. Elsewhere the lungs are clear.
There are underlying emphysematous changes. No CHF.

Aortic atherosclerosis.

Additional follow-up chest x-ray in 3-4 weeks is recommended to
assure complete clearing.

## 2017-04-09 ENCOUNTER — Telehealth: Payer: Self-pay

## 2017-04-09 NOTE — Telephone Encounter (Signed)
Prolia injection from pharmacy has arrived and has been placed in [name]'s fridge along with the papers that came with it .

## 2017-04-10 NOTE — Telephone Encounter (Signed)
Patient has been advised that prolia injection has arrived at our office from [facility]---she is coming for office visit with dr Yetta Barrejones on 7/17 and will get injection then, I have added notes to that office visit---paper for patient to sign after getting injection and prolia injection have been placed in cindy's refrigerator---patient needs to sign form and then give form back to tamara---$0 copay for injection

## 2017-04-16 ENCOUNTER — Ambulatory Visit (INDEPENDENT_AMBULATORY_CARE_PROVIDER_SITE_OTHER): Payer: Medicare Other | Admitting: Internal Medicine

## 2017-04-16 ENCOUNTER — Encounter: Payer: Self-pay | Admitting: Internal Medicine

## 2017-04-16 ENCOUNTER — Other Ambulatory Visit (INDEPENDENT_AMBULATORY_CARE_PROVIDER_SITE_OTHER): Payer: Medicare Other

## 2017-04-16 DIAGNOSIS — R269 Unspecified abnormalities of gait and mobility: Secondary | ICD-10-CM

## 2017-04-16 DIAGNOSIS — M818 Other osteoporosis without current pathological fracture: Secondary | ICD-10-CM

## 2017-04-16 DIAGNOSIS — E785 Hyperlipidemia, unspecified: Secondary | ICD-10-CM | POA: Diagnosis not present

## 2017-04-16 DIAGNOSIS — G4700 Insomnia, unspecified: Secondary | ICD-10-CM | POA: Diagnosis not present

## 2017-04-16 DIAGNOSIS — D72829 Elevated white blood cell count, unspecified: Secondary | ICD-10-CM

## 2017-04-16 DIAGNOSIS — E1100 Type 2 diabetes mellitus with hyperosmolarity without nonketotic hyperglycemic-hyperosmolar coma (NKHHC): Secondary | ICD-10-CM | POA: Diagnosis not present

## 2017-04-16 DIAGNOSIS — E538 Deficiency of other specified B group vitamins: Secondary | ICD-10-CM

## 2017-04-16 DIAGNOSIS — J441 Chronic obstructive pulmonary disease with (acute) exacerbation: Secondary | ICD-10-CM

## 2017-04-16 DIAGNOSIS — M810 Age-related osteoporosis without current pathological fracture: Secondary | ICD-10-CM | POA: Insufficient documentation

## 2017-04-16 LAB — HEPATIC FUNCTION PANEL
ALT: 6 U/L (ref 0–35)
AST: 9 U/L (ref 0–37)
Albumin: 4.1 g/dL (ref 3.5–5.2)
Alkaline Phosphatase: 78 U/L (ref 39–117)
Bilirubin, Direct: 0.1 mg/dL (ref 0.0–0.3)
Total Bilirubin: 0.4 mg/dL (ref 0.2–1.2)
Total Protein: 6.7 g/dL (ref 6.0–8.3)

## 2017-04-16 LAB — CBC WITH DIFFERENTIAL/PLATELET
Basophils Absolute: 0.1 10*3/uL (ref 0.0–0.1)
Basophils Relative: 1 % (ref 0.0–3.0)
Eosinophils Absolute: 0.1 10*3/uL (ref 0.0–0.7)
Eosinophils Relative: 2 % (ref 0.0–5.0)
HCT: 37.6 % (ref 36.0–46.0)
Hemoglobin: 12.5 g/dL (ref 12.0–15.0)
Lymphocytes Relative: 32.8 % (ref 12.0–46.0)
Lymphs Abs: 2.1 10*3/uL (ref 0.7–4.0)
MCHC: 33.4 g/dL (ref 30.0–36.0)
MCV: 88 fl (ref 78.0–100.0)
Monocytes Absolute: 0.5 10*3/uL (ref 0.1–1.0)
Monocytes Relative: 8.2 % (ref 3.0–12.0)
Neutro Abs: 3.5 10*3/uL (ref 1.4–7.7)
Neutrophils Relative %: 56 % (ref 43.0–77.0)
Platelets: 269 10*3/uL (ref 150.0–400.0)
RBC: 4.27 Mil/uL (ref 3.87–5.11)
RDW: 16.1 % — ABNORMAL HIGH (ref 11.5–15.5)
WBC: 6.3 10*3/uL (ref 4.0–10.5)

## 2017-04-16 LAB — HEMOGLOBIN A1C: Hgb A1c MFr Bld: 9.3 % — ABNORMAL HIGH (ref 4.6–6.5)

## 2017-04-16 LAB — BASIC METABOLIC PANEL
BUN: 23 mg/dL (ref 6–23)
CO2: 28 mEq/L (ref 19–32)
Calcium: 9.2 mg/dL (ref 8.4–10.5)
Chloride: 99 mEq/L (ref 96–112)
Creatinine, Ser: 0.76 mg/dL (ref 0.40–1.20)
GFR: 80.77 mL/min (ref >60.00)
Glucose, Bld: 233 mg/dL — ABNORMAL HIGH (ref 70–99)
Potassium: 4.4 mEq/L (ref 3.5–5.1)
Sodium: 137 mEq/L (ref 135–145)

## 2017-04-16 LAB — LIPID PANEL
Cholesterol: 213 mg/dL — ABNORMAL HIGH (ref 0–200)
HDL: 93.4 mg/dL (ref >39.00)
LDL Cholesterol: 83 mg/dL (ref 0–99)
NonHDL: 119.22
Total CHOL/HDL Ratio: 2
Triglycerides: 183 mg/dL — ABNORMAL HIGH (ref 0.0–149.0)
VLDL: 36.6 mg/dL (ref 0.0–40.0)

## 2017-04-16 MED ORDER — ZOLPIDEM TARTRATE 10 MG PO TABS: 10.0000 mg | ORAL_TABLET | Freq: Every evening | ORAL | 2 refills | Status: DC | PRN

## 2017-04-16 MED ORDER — METOPROLOL TARTRATE 25 MG PO TABS: ORAL_TABLET | ORAL | 3 refills | Status: DC

## 2017-04-16 MED ORDER — DENOSUMAB 60 MG/ML ~~LOC~~ SOLN
60.0000 mg | Freq: Once | SUBCUTANEOUS | Status: AC
  Administered 2017-04-16: 60 mg via SUBCUTANEOUS

## 2017-04-16 MED ORDER — CARBIDOPA-LEVODOPA 25-250 MG PO TABS: 1.0000 | ORAL_TABLET | Freq: Three times a day (TID) | ORAL | 3 refills | Status: DC

## 2017-04-16 MED ORDER — ALBUTEROL SULFATE HFA 108 (90 BASE) MCG/ACT IN AERS: 1.0000 | INHALATION_SPRAY | Freq: Four times a day (QID) | RESPIRATORY_TRACT | 3 refills | Status: DC | PRN

## 2017-04-16 MED ORDER — METFORMIN HCL ER 500 MG PO TB24: 500.0000 mg | ORAL_TABLET | Freq: Three times a day (TID) | ORAL | 1 refills | Status: DC

## 2017-04-16 MED ORDER — LORAZEPAM 2 MG PO TABS: ORAL_TABLET | ORAL | 2 refills | Status: DC

## 2017-04-16 NOTE — Assessment & Plan Note (Signed)
On Prandin, Metformin 

## 2017-04-16 NOTE — Progress Notes (Signed)
Subjective:  Patient ID: [name], female    DOB: 1951/06/14  Age: 66 y.o. MRN: [id number]  CC: No chief complaint on file.   HPI Malikah D Laura presents for chronic pain, anxiety, insomnia f/u. She stopped smoking in the fall of 2017  Outpatient Medications Prior to Visit  Medication Sig Dispense Refill  . albuterol (PROVENTIL HFA;VENTOLIN HFA) 108 (90 Base) MCG/ACT inhaler Inhale 1-2 puffs into the lungs every 6 (six) hours as needed for wheezing or shortness of breath. 1 Inhaler 3  . aspirin 81 MG EC tablet Take 81 mg by mouth daily.      Marland Kitchen b complex vitamins tablet Take 1 tablet by mouth daily. 100 tablet 3  . calcium carbonate (OSCAL) 1500 (600 Ca) MG TABS tablet Take 1 tablet (1,500 mg total) by mouth 2 (two) times daily with a meal. 60 tablet 4  . carbidopa-levodopa (SINEMET IR) 25-250 MG tablet TAKE 1 TABLET BY MOUTH 3 (THREE) TIMES DAILY. 270 tablet 3  . chlorhexidine (PERIDEX) 0.12 % solution RINSE WITH 1/2 OUNCE TWICE DAILY FOR 30 SECONDS AND SPIT OUT AFTER BREAKFAST AND BEFORE BED  0  . cholecalciferol (VITAMIN D) 1000 UNITS tablet Take 2 tablets (2,000 Units total) by mouth daily. 100 tablet 3  . glucose blood (ONETOUCH VERIO) test strip Use to check blood sugars twice a day. Dx E11.9 100 each 5  . HYDROmorphone (DILAUDID) 2 MG tablet Take 2-3 tablets (4-6 mg total) by mouth every 6 (six) hours as needed for moderate pain or severe pain. (Patient taking differently: Take 2 mg by mouth every 8 (eight) hours as needed for moderate pain or severe pain. ) 75 tablet 0  . LORazepam (ATIVAN) 2 MG tablet TAKE ONE-HALF TO ONE TABLET BY MOUTH THREE TIMES DAILY 90 tablet 1  . metFORMIN (GLUCOPHAGE-XR) 500 MG 24 hr tablet TAKE 1 TABLET THREE TIMES A DAY 270 tablet 1  . metoprolol tartrate (LOPRESSOR) 25 MG tablet TAKE ONE-HALF TABLET (12.5 MG) BY MOUTH TWO TIMES DAILY 90 tablet 2  . mirtazapine (REMERON) 15 MG tablet TAKE 1 TABLET BY MOUTH AT BEDTIME AT 6-8 PM 90 tablet 3  . ONETOUCH  DELICA LANCETS 33G MISC Use to help check blood sugars twice a day. Dx E11.9 100 each 3  . promethazine (PHENERGAN) 12.5 MG tablet Take 1 tablet (12.5 mg total) by mouth every 6 (six) hours as needed. for nausea 60 tablet 0  . repaglinide (PRANDIN) 1 MG tablet Take 1 tablet (1 mg total) by mouth 3 (three) times daily before meals. 90 tablet 3  . tamsulosin (FLOMAX) 0.4 MG CAPS capsule TAKE 1 CAPSULE (0.4 MG TOTAL) BY MOUTH DAILY. 30 capsule 5  . tolterodine (DETROL LA) 4 MG 24 hr capsule TAKE ONE CAPSULE AT BEDTIME 90 capsule 3  . umeclidinium-vilanterol (ANORO ELLIPTA) 62.5-25 MCG/INH AEPB Inhale 1 puff into the lungs daily. 1 each 3  . Vitamin D, Ergocalciferol, (DRISDOL) 50000 units CAPS capsule TAKE 1 CAPSULE (50,000 UNITS TOTAL) BY MOUTH ONCE A WEEK. 6 capsule 0  . zolpidem (AMBIEN) 10 MG tablet TAKE 1 TABLET BY MOUTH AT BEDTIME AS NEEDED FOR SLEEP 30 tablet 1   No facility-administered medications prior to visit.     ROS Review of Systems  Constitutional: Positive for unexpected weight change. Negative for activity change, appetite change, chills and fatigue.  HENT: Negative for congestion, mouth sores and sinus pressure.   Eyes: Negative for visual disturbance.  Respiratory: Negative for cough and chest tightness.  Gastrointestinal: Negative for abdominal pain and nausea.  Genitourinary: Negative for difficulty urinating, frequency and vaginal pain.  Musculoskeletal: Positive for arthralgias, back pain and gait problem.  Skin: Negative for pallor and rash.  Neurological: Negative for dizziness, tremors, weakness, numbness and headaches.  Psychiatric/Behavioral: Positive for decreased concentration and sleep disturbance. Negative for confusion and suicidal ideas. The patient is nervous/anxious.     Objective:  BP 116/76 (BP Location: Left Arm, Patient Position: Sitting, Cuff Size: Normal)   Pulse 82   Temp 98.5 F (36.9 C) (Oral)   Ht 5\' 5"  (1.651 m)   Wt 143 lb (64.9 kg)    SpO2 97%   BMI 23.80 kg/m   BP Readings from Last 3 Encounters:  04/16/17 116/76  11/15/16 (!) 146/68  11/10/16 134/78    Wt Readings from Last 3 Encounters:  04/16/17 143 lb (64.9 kg)  11/08/16 137 lb 12.6 oz (62.5 kg)  11/06/16 138 lb 3.2 oz (62.7 kg)    Physical Exam  Constitutional: She appears well-developed. No distress.  HENT:  Head: Normocephalic.  Right Ear: External ear normal.  Left Ear: External ear normal.  Nose: Nose normal.  Mouth/Throat: Oropharynx is clear and moist.  Eyes: Pupils are equal, round, and reactive to light. Conjunctivae are normal. Right eye exhibits no discharge. Left eye exhibits no discharge.  Neck: Normal range of motion. Neck supple. No JVD present. No tracheal deviation present. No thyromegaly present.  Cardiovascular: Normal rate, regular rhythm and normal heart sounds.   Pulmonary/Chest: No stridor. No respiratory distress. She has no wheezes.  Abdominal: Soft. Bowel sounds are normal. She exhibits no distension and no mass. There is no tenderness. There is no rebound and no guarding.  Musculoskeletal: She exhibits tenderness. She exhibits no edema.  Lymphadenopathy:    She has no cervical adenopathy.  Neurological: She displays normal reflexes. No cranial nerve deficit. She exhibits normal muscle tone. Coordination abnormal.  Skin: No rash noted. No erythema.  Psychiatric: She has a normal mood and affect. Her behavior is normal. Judgment and thought content normal.  in a w/c  Lab Results  Component Value Date   WBC 7.7 11/10/2016   HGB 10.7 (L) 11/10/2016   HCT 32.7 (L) 11/10/2016   PLT 147 (L) 11/10/2016   GLUCOSE 160 (H) 11/10/2016   CHOL 203 (H) 03/11/2015   TRIG 88.0 03/11/2015   HDL 72.20 03/11/2015   LDLDIRECT 100.8 10/26/2013   LDLCALC 113 (H) 03/11/2015   ALT 7 (L) 11/08/2016   AST 34 11/08/2016   NA 138 11/10/2016   K 3.4 (L) 11/10/2016   CL 106 11/10/2016   CREATININE 0.65 11/10/2016   BUN 11 11/10/2016   CO2  24 11/10/2016   TSH 1.43 12/28/2015   INR 1.12 11/08/2016   HGBA1C 9.8 (H) 07/31/2016    Dg Chest 2 View  Result Date: 11/08/2016 CLINICAL DATA:  Fever for 3 days EXAM: CHEST  2 VIEW COMPARISON:  08/01/2016 FINDINGS: Minimal bibasilar atelectasis. Heart is normal size. No effusions. No acute bony abnormality appear IMPRESSION: Minimal bibasilar atelectasis. Electronically Signed   By: Charlett NoseKevin  Dover M.D.   On: 11/08/2016 12:37    Assessment & Plan:   There are no diagnoses linked to this encounter. I am having Ms. Adline PotterGunn maintain her aspirin, HYDROmorphone, cholecalciferol, tolterodine, Vitamin D (Ergocalciferol), mirtazapine, calcium carbonate, repaglinide, promethazine, albuterol, carbidopa-levodopa, metoprolol tartrate, umeclidinium-vilanterol, b complex vitamins, glucose blood, ONETOUCH DELICA LANCETS 33G, chlorhexidine, tamsulosin, metFORMIN, zolpidem, LORazepam, and PROLIA.  Meds ordered this  encounter  Medications  . PROLIA 60 MG/ML SOLN injection     Follow-up: No Follow-up on file.  [name], MD

## 2017-04-16 NOTE — Assessment & Plan Note (Signed)
Zolpidem prn 

## 2017-04-16 NOTE — Addendum Note (Signed)
Addended by: Scarlett PrestoFRIEDENBACH, [place] on: 04/16/2017 02:30 PM   Modules accepted: Orders

## 2017-04-16 NOTE — Assessment & Plan Note (Signed)
In a w/c 

## 2017-04-16 NOTE — Assessment & Plan Note (Signed)
On B12 

## 2017-04-16 NOTE — Assessment & Plan Note (Signed)
Quit smoking in Oct 18th 2017

## 2017-04-16 NOTE — Assessment & Plan Note (Signed)
Prolia today 

## 2017-04-17 ENCOUNTER — Other Ambulatory Visit: Payer: Self-pay | Admitting: Internal Medicine

## 2017-04-17 MED ORDER — REPAGLINIDE 2 MG PO TABS: 2.0000 mg | ORAL_TABLET | Freq: Three times a day (TID) | ORAL | 11 refills | Status: DC

## 2017-04-18 DIAGNOSIS — G894 Chronic pain syndrome: Secondary | ICD-10-CM | POA: Diagnosis not present

## 2017-04-18 DIAGNOSIS — Z79891 Long term (current) use of opiate analgesic: Secondary | ICD-10-CM | POA: Diagnosis not present

## 2017-04-18 DIAGNOSIS — M47816 Spondylosis without myelopathy or radiculopathy, lumbar region: Secondary | ICD-10-CM | POA: Diagnosis not present

## 2017-04-21 ENCOUNTER — Other Ambulatory Visit: Payer: Self-pay | Admitting: Internal Medicine

## 2017-04-23 ENCOUNTER — Other Ambulatory Visit: Payer: Self-pay

## 2017-05-02 ENCOUNTER — Other Ambulatory Visit: Payer: Self-pay

## 2017-05-02 MED ORDER — GLUCOSE BLOOD VI STRP: ORAL_STRIP | 5 refills | Status: DC

## 2017-05-16 DIAGNOSIS — G894 Chronic pain syndrome: Secondary | ICD-10-CM | POA: Diagnosis not present

## 2017-05-16 DIAGNOSIS — Z79891 Long term (current) use of opiate analgesic: Secondary | ICD-10-CM | POA: Diagnosis not present

## 2017-05-16 DIAGNOSIS — M47816 Spondylosis without myelopathy or radiculopathy, lumbar region: Secondary | ICD-10-CM | POA: Diagnosis not present

## 2017-05-16 DIAGNOSIS — M79605 Pain in left leg: Secondary | ICD-10-CM | POA: Diagnosis not present

## 2017-05-20 ENCOUNTER — Telehealth: Payer: Self-pay | Admitting: Internal Medicine

## 2017-05-20 MED ORDER — LORAZEPAM 2 MG PO TABS: ORAL_TABLET | ORAL | 2 refills | Status: DC

## 2017-05-20 NOTE — Telephone Encounter (Signed)
OK to fill this/these prescription(s) with additional refills x2 if time Thank you!

## 2017-05-20 NOTE — Telephone Encounter (Signed)
Pt called requesting a refill on her LORazepam (ATIVAN) 2 MG tablet. [facility] [address]

## 2017-05-20 NOTE — Telephone Encounter (Signed)
Called refill into [facility] had to leave on pharmacy vm../lmb 

## 2017-05-20 NOTE — Telephone Encounter (Signed)
Check [place] registry last [facility].../lmb

## 2017-05-27 ENCOUNTER — Other Ambulatory Visit: Payer: Self-pay | Admitting: Internal Medicine

## 2017-06-04 ENCOUNTER — Other Ambulatory Visit: Payer: Self-pay | Admitting: Internal Medicine

## 2017-06-20 DIAGNOSIS — M25562 Pain in left knee: Secondary | ICD-10-CM | POA: Diagnosis not present

## 2017-06-20 DIAGNOSIS — M47816 Spondylosis without myelopathy or radiculopathy, lumbar region: Secondary | ICD-10-CM | POA: Diagnosis not present

## 2017-06-20 DIAGNOSIS — G894 Chronic pain syndrome: Secondary | ICD-10-CM | POA: Diagnosis not present

## 2017-06-20 DIAGNOSIS — Z79891 Long term (current) use of opiate analgesic: Secondary | ICD-10-CM | POA: Diagnosis not present

## 2017-07-16 ENCOUNTER — Other Ambulatory Visit: Payer: Self-pay | Admitting: Internal Medicine

## 2017-07-17 NOTE — Telephone Encounter (Signed)
Routing to dr jones, please advise in the absence of dr plotnikov, thanks 

## 2017-07-18 DIAGNOSIS — M25562 Pain in left knee: Secondary | ICD-10-CM | POA: Diagnosis not present

## 2017-07-18 DIAGNOSIS — G894 Chronic pain syndrome: Secondary | ICD-10-CM | POA: Diagnosis not present

## 2017-07-18 DIAGNOSIS — M47816 Spondylosis without myelopathy or radiculopathy, lumbar region: Secondary | ICD-10-CM | POA: Diagnosis not present

## 2017-07-18 DIAGNOSIS — Z79891 Long term (current) use of opiate analgesic: Secondary | ICD-10-CM | POA: Diagnosis not present

## 2017-07-18 NOTE — Telephone Encounter (Signed)
Faxed ativan to [phone]

## 2017-07-25 ENCOUNTER — Other Ambulatory Visit: Payer: Self-pay | Admitting: Internal Medicine

## 2017-07-26 NOTE — Telephone Encounter (Signed)
Routing to dr plotnikov, please advise, thanks 

## 2017-07-31 NOTE — Telephone Encounter (Signed)
[facility].../lmb  

## 2017-08-12 ENCOUNTER — Other Ambulatory Visit: Payer: Self-pay | Admitting: Internal Medicine

## 2017-08-15 DIAGNOSIS — Z79891 Long term (current) use of opiate analgesic: Secondary | ICD-10-CM | POA: Diagnosis not present

## 2017-08-15 DIAGNOSIS — M25562 Pain in left knee: Secondary | ICD-10-CM | POA: Diagnosis not present

## 2017-08-15 DIAGNOSIS — G894 Chronic pain syndrome: Secondary | ICD-10-CM | POA: Diagnosis not present

## 2017-08-15 DIAGNOSIS — M47816 Spondylosis without myelopathy or radiculopathy, lumbar region: Secondary | ICD-10-CM | POA: Diagnosis not present

## 2017-08-20 ENCOUNTER — Telehealth: Payer: Self-pay | Admitting: Internal Medicine

## 2017-08-20 ENCOUNTER — Encounter: Payer: Self-pay | Admitting: Internal Medicine

## 2017-08-20 ENCOUNTER — Ambulatory Visit (INDEPENDENT_AMBULATORY_CARE_PROVIDER_SITE_OTHER): Payer: Medicare Other | Admitting: Internal Medicine

## 2017-08-20 DIAGNOSIS — J441 Chronic obstructive pulmonary disease with (acute) exacerbation: Secondary | ICD-10-CM | POA: Diagnosis not present

## 2017-08-20 MED ORDER — BENZONATATE 100 MG PO CAPS: 100.0000 mg | ORAL_CAPSULE | Freq: Two times a day (BID) | ORAL | 0 refills | Status: DC | PRN

## 2017-08-20 MED ORDER — PREDNISONE 20 MG PO TABS: 40.0000 mg | ORAL_TABLET | Freq: Every day | ORAL | 0 refills | Status: DC

## 2017-08-20 MED ORDER — ALBUTEROL SULFATE HFA 108 (90 BASE) MCG/ACT IN AERS
1.0000 | INHALATION_SPRAY | Freq: Four times a day (QID) | RESPIRATORY_TRACT | 0 refills | Status: DC | PRN
Start: 2017-08-20 — End: 2019-08-06

## 2017-08-20 NOTE — Patient Instructions (Addendum)
We have sent in the cough medicine tessalon perles to use up to 3 times per day.  We have sent in prednisone to take starting Friday if not better,take 2 pills per day.   Start taking zyrtec (cetirizine) daily to help with the drainage.  Call back on Monday if not improved.

## 2017-08-20 NOTE — Telephone Encounter (Signed)
Patient was requesting to be seen. The appointments I had with another PCP they refused. Dr.Plotnikov does not have any appointments this week.   Called to informed patient of this. They took the appointment with another PCP.

## 2017-08-20 NOTE — Assessment & Plan Note (Signed)
Given recent CAP and hypoxia will be more aggressive. Given tessalon perles for cough. Rx for prednisone to start in 2 days if no improvement. Call back Monday if no improvement for antibiotic course. Refilled albuterol as she did not have this.

## 2017-08-20 NOTE — Progress Notes (Signed)
   Subjective:    Patient ID: [name], female    DOB: 1951-07-27, 66 y.o.   MRN: [id number]  HPI The patient is a 66 YO female coming in for cough and sore throat. Has COPD and takes anoro and albuterol prn although she is out of albuterol. She denies SOB currently. Is coughing up clear to yellow sputum. Started 2 days ago. Denies fevers or chills. Some nose drainage and sore throat. No ear pain or drainage. Taking some chloraseptic and delsym without relief. Overall stable since onset.  Review of Systems  Constitutional: Positive for chills. Negative for activity change, appetite change, fatigue, fever and unexpected weight change.  HENT: Positive for congestion, postnasal drip, rhinorrhea, sinus pressure and sore throat. Negative for ear discharge, ear pain, sinus pain, sneezing, tinnitus, trouble swallowing and voice change.   Eyes: Negative.   Respiratory: Positive for cough. Negative for chest tightness, shortness of breath and wheezing.   Cardiovascular: Negative.   Gastrointestinal: Negative.   Neurological: Negative.       Objective:   Physical Exam  Constitutional: She is oriented to person, place, and time. She appears well-developed and well-nourished.  HENT:  Head: Normocephalic and atraumatic.  Oropharynx with redness and clear drainage, nose with swollen turbinates, TMs normal bilaterally  Eyes: EOM are normal.  Neck: Normal range of motion. No thyromegaly present.  Cardiovascular: Normal rate and regular rhythm.  Pulmonary/Chest: Effort normal and breath sounds normal. No respiratory distress. She has no wheezes. She has no rales.  Abdominal: Soft.  Lymphadenopathy:    She has no cervical adenopathy.  Neurological: She is alert and oriented to person, place, and time.  Skin: Skin is warm and dry.   Vitals:   08/20/17 1048  BP: 124/68  Pulse: 86  Temp: 98.3 F (36.8 C)  TempSrc: Oral  SpO2: 95%  Weight: 153 lb (69.4 kg)  Height: 5\' 5"  (1.651 m)        Assessment & Plan:

## 2017-08-21 ENCOUNTER — Other Ambulatory Visit: Payer: Self-pay | Admitting: Internal Medicine

## 2017-09-19 ENCOUNTER — Other Ambulatory Visit: Payer: Self-pay | Admitting: Internal Medicine

## 2017-09-19 DIAGNOSIS — M25562 Pain in left knee: Secondary | ICD-10-CM | POA: Diagnosis not present

## 2017-09-19 DIAGNOSIS — G894 Chronic pain syndrome: Secondary | ICD-10-CM | POA: Diagnosis not present

## 2017-09-19 DIAGNOSIS — M47816 Spondylosis without myelopathy or radiculopathy, lumbar region: Secondary | ICD-10-CM | POA: Diagnosis not present

## 2017-09-19 DIAGNOSIS — Z79891 Long term (current) use of opiate analgesic: Secondary | ICD-10-CM | POA: Diagnosis not present

## 2017-09-23 ENCOUNTER — Ambulatory Visit (INDEPENDENT_AMBULATORY_CARE_PROVIDER_SITE_OTHER): Payer: Medicare Other | Admitting: Internal Medicine

## 2017-09-23 ENCOUNTER — Encounter: Payer: Self-pay | Admitting: Internal Medicine

## 2017-09-23 DIAGNOSIS — F4321 Adjustment disorder with depressed mood: Secondary | ICD-10-CM | POA: Diagnosis not present

## 2017-09-23 DIAGNOSIS — J441 Chronic obstructive pulmonary disease with (acute) exacerbation: Secondary | ICD-10-CM

## 2017-09-23 DIAGNOSIS — E538 Deficiency of other specified B group vitamins: Secondary | ICD-10-CM | POA: Diagnosis not present

## 2017-09-23 DIAGNOSIS — E1100 Type 2 diabetes mellitus with hyperosmolarity without nonketotic hyperglycemic-hyperosmolar coma (NKHHC): Secondary | ICD-10-CM | POA: Diagnosis not present

## 2017-09-23 MED ORDER — PROMETHAZINE-CODEINE 6.25-10 MG/5ML PO SYRP: 5.0000 mL | ORAL_SOLUTION | ORAL | 0 refills | Status: DC | PRN

## 2017-09-23 MED ORDER — AZITHROMYCIN 250 MG PO TABS: ORAL_TABLET | ORAL | 1 refills | Status: DC

## 2017-09-23 NOTE — Patient Instructions (Signed)
You can use over-the-counter  "cold" medicines  such as "Tylenol cold" , "Advil cold",  "Mucinex" or" Mucinex D"  for cough and congestion.   Avoid decongestants if you have high blood pressure and use "Afrin" nasal spray for nasal congestion as directed. Use " Delsym" or" Robitussin" cough syrup varietis for cough.  You can use plain "Tylenol" or "Advil" for fever, chills and achyness. Use Halls or Ricola cough drops.   Please, make an appointment if you are not better or if you're worse.  

## 2017-09-23 NOTE — Assessment & Plan Note (Signed)
mom died at 598 Discussed

## 2017-09-23 NOTE — Assessment & Plan Note (Signed)
URI 12/18 Anoro Depo-medrol im

## 2017-09-23 NOTE — Progress Notes (Signed)
Subjective:  Patient ID: [name], female    DOB: Dec 08, 1950  Age: 66 y.o. MRN: [id number]  CC: No chief complaint on file.   HPI [name] presents for insomnia, anxiety, depression Mother died 2 weeks ago C/o URI sx's  Outpatient Medications Prior to Visit  Medication Sig Dispense Refill  . albuterol (PROVENTIL HFA;VENTOLIN HFA) 108 (90 Base) MCG/ACT inhaler Inhale 1-2 puffs into the lungs every 6 (six) hours as needed for wheezing or shortness of breath. 1 Inhaler 0  . aspirin 81 MG EC tablet Take 81 mg by mouth daily.      Marland Kitchen. b complex vitamins tablet Take 1 tablet by mouth daily. 100 tablet 3  . benzonatate (TESSALON) 100 MG capsule Take 1 capsule (100 mg total) by mouth 2 (two) times daily as needed for cough. 60 capsule 0  . calcium carbonate (OSCAL) 1500 (600 Ca) MG TABS tablet Take 1 tablet (1,500 mg total) by mouth 2 (two) times daily with a meal. 60 tablet 4  . carbidopa-levodopa (SINEMET IR) 25-250 MG tablet Take 1 tablet by mouth 3 (three) times daily. 270 tablet 3  . chlorhexidine (PERIDEX) 0.12 % solution RINSE WITH 1/2 OUNCE TWICE DAILY FOR 30 SECONDS AND SPIT OUT AFTER BREAKFAST AND BEFORE BED  0  . cholecalciferol (VITAMIN D) 1000 UNITS tablet Take 2 tablets (2,000 Units total) by mouth daily. 100 tablet 3  . glucose blood (ONETOUCH VERIO) test strip Use to check blood sugars twice a day. Dx E11.9 100 each 5  . HYDROmorphone (DILAUDID) 2 MG tablet Take 2-3 tablets (4-6 mg total) by mouth every 6 (six) hours as needed for moderate pain or severe pain. (Patient taking differently: Take 2 mg by mouth every 8 (eight) hours as needed for moderate pain or severe pain. ) 75 tablet 0  . LORazepam (ATIVAN) 2 MG tablet TAKE ONE-HALF TO ONE TABLET THREE TIMES A DAY 90 tablet 2  . metFORMIN (GLUCOPHAGE-XR) 500 MG 24 hr tablet Take 1 tablet (500 mg total) by mouth 3 (three) times daily. 270 tablet 1  . metoprolol tartrate (LOPRESSOR) 25 MG tablet Take 0.5 tablets (12.5 mg  total) 2 (two) times daily by mouth. 90 tablet 3  . mirtazapine (REMERON) 15 MG tablet TAKE 1 TABLET BY MOUTH AT BEDTIME AT 6-8 PM 90 tablet 3  . ONETOUCH DELICA LANCETS 33G MISC Use to help check blood sugars twice a day. Dx E11.9 100 each 3  . PROLIA 60 MG/ML SOLN injection     . promethazine (PHENERGAN) 12.5 MG tablet Take 1 tablet (12.5 mg total) by mouth every 6 (six) hours as needed. for nausea 60 tablet 0  . repaglinide (PRANDIN) 2 MG tablet Take 1 tablet (2 mg total) by mouth 3 (three) times daily before meals. 90 tablet 11  . tamsulosin (FLOMAX) 0.4 MG CAPS capsule Take 1 capsule (0.4 mg total) daily by mouth. 90 capsule 1  . tolterodine (DETROL LA) 4 MG 24 hr capsule TAKE ONE CAPSULE AT BEDTIME 90 capsule 3  . umeclidinium-vilanterol (ANORO ELLIPTA) 62.5-25 MCG/INH AEPB Inhale 1 puff into the lungs daily. 1 each 3  . Vitamin D, Ergocalciferol, (DRISDOL) 50000 units CAPS capsule TAKE 1 CAPSULE (50,000 UNITS TOTAL) BY MOUTH ONCE A WEEK. 6 capsule 0  . zolpidem (AMBIEN) 10 MG tablet TAKE 1 TABLET BY MOUTH EVERY DAY AT BEDTIME AS NEEDED FOR SLEEP 30 tablet 2  . metoprolol tartrate (LOPRESSOR) 25 MG tablet TAKE ONE-HALF TABLET (12.5 MG) BY MOUTH  TWO TIMES DAILY 90 tablet 1  . predniSONE (DELTASONE) 20 MG tablet Take 2 tablets (40 mg total) by mouth daily with breakfast. (Patient not taking: Reported on 09/23/2017) 10 tablet 0   No facility-administered medications prior to visit.     ROS Review of Systems  Constitutional: Negative for activity change, appetite change, chills, fatigue and unexpected weight change.  HENT: Negative for congestion, mouth sores and sinus pressure.   Eyes: Negative for visual disturbance.  Respiratory: Negative for cough and chest tightness.   Gastrointestinal: Negative for abdominal pain and nausea.  Genitourinary: Negative for difficulty urinating, frequency and vaginal pain.  Musculoskeletal: Negative for back pain and gait problem.  Skin: Negative for  pallor and rash.  Neurological: Negative for dizziness, tremors, weakness, numbness and headaches.  Psychiatric/Behavioral: Negative for confusion and sleep disturbance. The patient is nervous/anxious.     Objective:  BP 130/72 (BP Location: Right Arm, Patient Position: Sitting, Cuff Size: Large)   Pulse 73   Temp 98.2 F (36.8 C) (Oral)   Ht 5\' 5"  (1.651 m)   Wt 152 lb (68.9 kg)   SpO2 96%   BMI 25.29 kg/m   BP Readings from Last 3 Encounters:  09/23/17 130/72  08/20/17 124/68  04/16/17 116/76    Wt Readings from Last 3 Encounters:  09/23/17 152 lb (68.9 kg)  08/20/17 153 lb (69.4 kg)  04/16/17 143 lb (64.9 kg)    Physical Exam  Constitutional: She appears well-developed. No distress.  HENT:  Head: Normocephalic.  Right Ear: External ear normal.  Left Ear: External ear normal.  Nose: Nose normal.  Mouth/Throat: Oropharynx is clear and moist.  Eyes: Conjunctivae are normal. Pupils are equal, round, and reactive to light. Right eye exhibits no discharge. Left eye exhibits no discharge.  Neck: Normal range of motion. Neck supple. No JVD present. No tracheal deviation present. No thyromegaly present.  Cardiovascular: Normal rate, regular rhythm and normal heart sounds.  Pulmonary/Chest: No stridor. No respiratory distress. She has no wheezes.  Abdominal: Soft. Bowel sounds are normal. She exhibits no distension and no mass. There is no tenderness. There is no rebound and no guarding.  Musculoskeletal: She exhibits tenderness. She exhibits no edema.  Lymphadenopathy:    She has no cervical adenopathy.  Neurological: She displays normal reflexes. No cranial nerve deficit. She exhibits normal muscle tone. Coordination abnormal.  Skin: No rash noted. No erythema.  Psychiatric: She has a normal mood and affect. Her behavior is normal. Judgment and thought content normal.  sad In a w/c  Lab Results  Component Value Date   WBC 6.3 04/16/2017   HGB 12.5 04/16/2017   HCT  37.6 04/16/2017   PLT 269.0 04/16/2017   GLUCOSE 233 (H) 04/16/2017   CHOL 213 (H) 04/16/2017   TRIG 183.0 (H) 04/16/2017   HDL 93.40 04/16/2017   LDLDIRECT 100.8 10/26/2013   LDLCALC 83 04/16/2017   ALT 6 04/16/2017   AST 9 04/16/2017   NA 137 04/16/2017   K 4.4 04/16/2017   CL 99 04/16/2017   CREATININE 0.76 04/16/2017   BUN 23 04/16/2017   CO2 28 04/16/2017   TSH 1.43 12/28/2015   INR 1.12 11/08/2016   HGBA1C 9.3 (H) 04/16/2017    Dg Chest 2 View  Result Date: 11/08/2016 CLINICAL DATA:  Fever for 3 days EXAM: CHEST  2 VIEW COMPARISON:  08/01/2016 FINDINGS: Minimal bibasilar atelectasis. Heart is normal size. No effusions. No acute bony abnormality appear IMPRESSION: Minimal bibasilar atelectasis. Electronically Signed  By: Charlett NoseKevin  Dover M.D.   On: 11/08/2016 12:37    Assessment & Plan:   There are no diagnoses linked to this encounter. I have discontinued [name]'s predniSONE. I am also having her maintain her aspirin, HYDROmorphone, cholecalciferol, tolterodine, Vitamin D (Ergocalciferol), calcium carbonate, promethazine, umeclidinium-vilanterol, b complex vitamins, ONETOUCH DELICA LANCETS 33G, chlorhexidine, PROLIA, metFORMIN, carbidopa-levodopa, repaglinide, glucose blood, mirtazapine, zolpidem, tamsulosin, metoprolol tartrate, benzonatate, albuterol, and LORazepam.  No orders of the defined types were placed in this encounter.    Follow-up: No Follow-up on file.  Sonda PrimesAlex , MD

## 2017-09-23 NOTE — Assessment & Plan Note (Signed)
Prandin 

## 2017-09-23 NOTE — Assessment & Plan Note (Signed)
Re-start B12 

## 2017-09-27 ENCOUNTER — Other Ambulatory Visit: Payer: Self-pay | Admitting: Internal Medicine

## 2017-10-01 DIAGNOSIS — N390 Urinary tract infection, site not specified: Secondary | ICD-10-CM

## 2017-10-01 HISTORY — DX: Urinary tract infection, site not specified: N39.0

## 2017-10-03 ENCOUNTER — Emergency Department (HOSPITAL_COMMUNITY): Payer: Medicare Other

## 2017-10-03 ENCOUNTER — Other Ambulatory Visit: Payer: Self-pay

## 2017-10-03 ENCOUNTER — Encounter (HOSPITAL_COMMUNITY): Payer: Self-pay

## 2017-10-03 ENCOUNTER — Inpatient Hospital Stay (HOSPITAL_COMMUNITY)
Admission: EM | Admit: 2017-10-03 | Discharge: 2017-10-06 | DRG: 872 | Disposition: A | Discharge status: Home / Self Care | Payer: Medicare Other | Attending: Family Medicine | Admitting: Family Medicine

## 2017-10-03 DIAGNOSIS — Z87820 Personal history of traumatic brain injury: Secondary | ICD-10-CM

## 2017-10-03 DIAGNOSIS — Z7984 Long term (current) use of oral hypoglycemic drugs: Secondary | ICD-10-CM

## 2017-10-03 DIAGNOSIS — G9340 Encephalopathy, unspecified: Secondary | ICD-10-CM | POA: Diagnosis present

## 2017-10-03 DIAGNOSIS — I700 Atherosclerosis of aorta: Secondary | ICD-10-CM | POA: Diagnosis present

## 2017-10-03 DIAGNOSIS — R531 Weakness: Secondary | ICD-10-CM

## 2017-10-03 DIAGNOSIS — J449 Chronic obstructive pulmonary disease, unspecified: Secondary | ICD-10-CM | POA: Diagnosis present

## 2017-10-03 DIAGNOSIS — G8929 Other chronic pain: Secondary | ICD-10-CM | POA: Diagnosis present

## 2017-10-03 DIAGNOSIS — M542 Cervicalgia: Secondary | ICD-10-CM | POA: Diagnosis present

## 2017-10-03 DIAGNOSIS — G931 Anoxic brain damage, not elsewhere classified: Secondary | ICD-10-CM | POA: Diagnosis present

## 2017-10-03 DIAGNOSIS — Z8669 Personal history of other diseases of the nervous system and sense organs: Secondary | ICD-10-CM | POA: Diagnosis present

## 2017-10-03 DIAGNOSIS — I959 Hypotension, unspecified: Secondary | ICD-10-CM | POA: Diagnosis present

## 2017-10-03 DIAGNOSIS — B9620 Unspecified Escherichia coli [E. coli] as the cause of diseases classified elsewhere: Secondary | ICD-10-CM | POA: Diagnosis not present

## 2017-10-03 DIAGNOSIS — G40909 Epilepsy, unspecified, not intractable, without status epilepticus: Secondary | ICD-10-CM | POA: Diagnosis present

## 2017-10-03 DIAGNOSIS — E119 Type 2 diabetes mellitus without complications: Secondary | ICD-10-CM

## 2017-10-03 DIAGNOSIS — E785 Hyperlipidemia, unspecified: Secondary | ICD-10-CM | POA: Diagnosis present

## 2017-10-03 DIAGNOSIS — Z886 Allergy status to analgesic agent status: Secondary | ICD-10-CM

## 2017-10-03 DIAGNOSIS — F319 Bipolar disorder, unspecified: Secondary | ICD-10-CM | POA: Diagnosis present

## 2017-10-03 DIAGNOSIS — N390 Urinary tract infection, site not specified: Secondary | ICD-10-CM | POA: Diagnosis not present

## 2017-10-03 DIAGNOSIS — Z87891 Personal history of nicotine dependence: Secondary | ICD-10-CM

## 2017-10-03 DIAGNOSIS — A419 Sepsis, unspecified organism: Secondary | ICD-10-CM | POA: Diagnosis not present

## 2017-10-03 DIAGNOSIS — R4701 Aphasia: Secondary | ICD-10-CM | POA: Diagnosis not present

## 2017-10-03 DIAGNOSIS — M25511 Pain in right shoulder: Secondary | ICD-10-CM | POA: Diagnosis present

## 2017-10-03 DIAGNOSIS — R05 Cough: Secondary | ICD-10-CM | POA: Diagnosis not present

## 2017-10-03 DIAGNOSIS — G259 Extrapyramidal and movement disorder, unspecified: Secondary | ICD-10-CM | POA: Diagnosis not present

## 2017-10-03 DIAGNOSIS — R404 Transient alteration of awareness: Secondary | ICD-10-CM

## 2017-10-03 DIAGNOSIS — Z993 Dependence on wheelchair: Secondary | ICD-10-CM

## 2017-10-03 DIAGNOSIS — R42 Dizziness and giddiness: Secondary | ICD-10-CM | POA: Diagnosis not present

## 2017-10-03 DIAGNOSIS — Z79899 Other long term (current) drug therapy: Secondary | ICD-10-CM

## 2017-10-03 DIAGNOSIS — Z7982 Long term (current) use of aspirin: Secondary | ICD-10-CM

## 2017-10-03 DIAGNOSIS — Z888 Allergy status to other drugs, medicaments and biological substances status: Secondary | ICD-10-CM

## 2017-10-03 DIAGNOSIS — M1990 Unspecified osteoarthritis, unspecified site: Secondary | ICD-10-CM | POA: Diagnosis present

## 2017-10-03 HISTORY — DX: Urinary tract infection, site not specified: N39.0

## 2017-10-03 HISTORY — DX: Cutaneous abscess of other sites: L02.818

## 2017-10-03 HISTORY — DX: Cellulitis of other sites: L03.818

## 2017-10-03 HISTORY — DX: Atherosclerosis of aorta: I70.0

## 2017-10-03 LAB — URINALYSIS, ROUTINE W REFLEX MICROSCOPIC
Bilirubin Urine: NEGATIVE
Glucose, UA: NEGATIVE mg/dL
Hgb urine dipstick: NEGATIVE
Ketones, ur: 5 mg/dL — AB
Nitrite: POSITIVE — AB
Protein, ur: NEGATIVE mg/dL
Specific Gravity, Urine: 1.017 (ref 1.005–1.030)
pH: 6 (ref 5.0–8.0)

## 2017-10-03 LAB — DIFFERENTIAL
Basophils Absolute: 0.1 10*3/uL (ref 0.0–0.1)
Basophils Relative: 1 %
Eosinophils Absolute: 0.2 10*3/uL (ref 0.0–0.7)
Eosinophils Relative: 3 %
Lymphocytes Relative: 23 %
Lymphs Abs: 1.8 10*3/uL (ref 0.7–4.0)
Monocytes Absolute: 0.5 10*3/uL (ref 0.1–1.0)
Monocytes Relative: 6 %
Neutro Abs: 5.3 10*3/uL (ref 1.7–7.7)
Neutrophils Relative %: 67 %

## 2017-10-03 LAB — I-STAT TROPONIN, ED: Troponin i, poc: 0 ng/mL (ref 0.00–0.08)

## 2017-10-03 LAB — COMPREHENSIVE METABOLIC PANEL
ALT: 14 U/L (ref 14–54)
AST: 19 U/L (ref 15–41)
Albumin: 4 g/dL (ref 3.5–5.0)
Alkaline Phosphatase: 71 U/L (ref 38–126)
Anion gap: 10 (ref 5–15)
BUN: 13 mg/dL (ref 6–20)
CO2: 27 mmol/L (ref 22–32)
Calcium: 9.4 mg/dL (ref 8.9–10.3)
Chloride: 97 mmol/L — ABNORMAL LOW (ref 101–111)
Creatinine, Ser: 0.7 mg/dL (ref 0.44–1.00)
GFR calc Af Amer: >60 mL/min (ref >60)
GFR calc non Af Amer: >60 mL/min (ref >60)
Glucose, Bld: 129 mg/dL — ABNORMAL HIGH (ref 65–99)
Potassium: 4.4 mmol/L (ref 3.5–5.1)
Sodium: 134 mmol/L — ABNORMAL LOW (ref 135–145)
Total Bilirubin: 0.5 mg/dL (ref 0.3–1.2)
Total Protein: 6.8 g/dL (ref 6.5–8.1)

## 2017-10-03 LAB — CBC
HCT: 38.7 % (ref 36.0–46.0)
Hemoglobin: 12.2 g/dL (ref 12.0–15.0)
MCH: 28 pg (ref 26.0–34.0)
MCHC: 31.5 g/dL (ref 30.0–36.0)
MCV: 89 fL (ref 78.0–100.0)
Platelets: 292 10*3/uL (ref 150–400)
RBC: 4.35 MIL/uL (ref 3.87–5.11)
RDW: 15.4 % (ref 11.5–15.5)
WBC: 7.9 10*3/uL (ref 4.0–10.5)

## 2017-10-03 MED ORDER — DEXTROSE 5 % IV SOLN
1.0000 g | Freq: Once | INTRAVENOUS | Status: AC
  Administered 2017-10-03: 1 g via INTRAVENOUS
  Filled 2017-10-03: qty 10

## 2017-10-03 MED ORDER — HYDROMORPHONE HCL 1 MG/ML IJ SOLN
1.0000 mg | Freq: Once | INTRAMUSCULAR | Status: AC
  Administered 2017-10-03: 1 mg via INTRAVENOUS
  Filled 2017-10-03: qty 1

## 2017-10-03 NOTE — ED Provider Notes (Signed)
[facility] Provider Note   CSN: [id number] Arrival date & time: 10/03/17  1659     History   Chief Complaint Chief Complaint  Patient presents with  . Aphasia    HPI Jamisha D Adline PotterGunn is a 67 y.o. female.  The history is provided by the patient and the spouse. No language interpreter was used.    Claretta Fraiseanci D Heyne is a 67 y.o. female who presents to the Emergency Department complaining of AMS.  History is provided by the patient, EMS and husband.  She was sitting in recliner earlier today when at 3:45 PM her CNA noticed that she had sudden onset aphasia with stiffness of bilateral arms.  She states that she felt very dizzy at that time and had difficulty speaking.  The episode lasted about 10 minutes.  Currently she feels at her baseline.  She has experienced a cough since dealing with a virus/cold that started about a month ago.  No fevers, vomiting, abdominal pain, dysuria.  She did develop right upper chest pain after presenting to the emergency department.  Her pain is worse with movement.  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  Her CNA noted that she had left sided facial droop at home but that has resolved on ED arrival.  Past Medical History:  Diagnosis Date  . Anoxic brain injury (HCC)   . Anxiety   . Chronic neck pain   . Chronic pain in shoulder   . COPD (chronic obstructive pulmonary disease) (HCC)   . Depression   . Fracture of left humerus 06/24/2014  . Gait disorder   . GERD (gastroesophageal reflux disease)   . Hernia of abdominal cavity   . 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 Hyperlipidemia   . LBP (low back pain)   . Neuropathy   . Osteoarthritis   . Seizure disorder (HCC)   . Seizures (HCC)   . Type II or unspecified type diabetes mellitus without mention of complication, not stated as  uncontrolled 2010  . Vitamin B 12 deficiency 2011    Patient Active Problem List   Diagnosis Date Noted  . Grief 09/23/2017  . Osteoporosis 04/16/2017  . Irregular heart beat 11/15/2016  . Urinary tract infection without hematuria   . Severe sepsis (HCC) 11/08/2016  . Leukocytosis 07/31/2016  . Community acquired pneumonia   . Hyperglycemia   . Acute respiratory failure with hypoxia (HCC)   . Acute urinary retention   . Elevated troponin   . Sepsis (HCC) 07/18/2016  . Lobar pneumonia, unspecified organism (HCC) 07/18/2016  . Pharyngitis 07/18/2016  . Elevated troponin I level 07/18/2016  . Oral thrush 07/18/2016  . Odynophagia 07/18/2016  . Anoxic brain injury (HCC) 12/29/2015  . Gait disorder 12/28/2015  . Well adult exam 12/08/2014  . Fracture of left humerus 06/24/2014  . Humerus fracture 06/24/2014  . Chest wall contusion 05/03/2014  . Acute bronchitis 11/10/2013  . Left elbow pain 11/03/2013  . Right facial pain 06/19/2013  . Ankle ulcer (HCC) 04/02/2012  . Nausea & vomiting 10/05/2011  . Decubitus ulcer 06/26/2011  . Decubitus ulcer of coccyx 05/21/2011  . Cellulitis and abscess of other specified site 10/11/2010  . B12 deficiency 04/04/2010  . HYPERLIPIDEMIA 04/04/2010  . Anxiety state 11/14/2009  . Vitamin D deficiency 10/21/2009  . MELENA 10/21/2009  . DM2 (diabetes  mellitus, type 2) (HCC) 07/07/2009  . FEVER, RECURRENT 06/21/2009  . COPD exacerbation (HCC) 02/25/2009  . OSTEOARTHRITIS 02/25/2009  . LOW BACK PAIN 02/25/2009  . TOBACCO USER 01/13/2009  . Rash and other nonspecific skin eruption 01/13/2009  . FURUNCLE 06/08/2008  . DEPRESSION 02/26/2007  . PAIN, CHRONIC NEC 02/26/2007  . Venous (peripheral) insufficiency 02/26/2007  . GERD 02/26/2007  . HERNIA, INCISIONAL 02/26/2007  . SHOULDER PAIN, LEFT 02/26/2007  . 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 INSOMNIA 02/26/2007  . Abnormality of gait 02/26/2007  .  LEG EDEMA, CHRONIC 02/26/2007    Past Surgical History:  Procedure Laterality Date  . COLONOSCOPY    . DIAGNOSTIC LAPAROSCOPY     PMH: Exploratory lap  . HERNIA REPAIR     incisional  . ORIF HUMERUS FRACTURE Left 06/24/2014   Procedure: LEFT OPEN REDUCTION INTERNAL FIXATION (ORIF) PROXIMAL HUMERUS FRACTURE;  Surgeon: Eulas Post, MD;  Location: [facility] OR;  Service: Orthopedics;  Laterality: Left;  . PANCREATECTOMY    . TONSILLECTOMY    . TUBAL LIGATION      OB History    No data available       Home Medications    Prior to Admission medications   Medication Sig Start Date End Date Taking? Authorizing Provider  albuterol (PROVENTIL HFA;VENTOLIN HFA) 108 (90 Base) MCG/ACT inhaler Inhale 1-2 puffs into the lungs every 6 (six) hours as needed for wheezing or shortness of breath. 08/20/17   Myrlene Broker, MD  aspirin 81 MG EC tablet Take 81 mg by mouth daily.      [provider]  azithromycin (ZITHROMAX Z-PAK) 250 MG tablet As irected 09/23/17   Plotnikov, Georgina Quint, MD  b complex vitamins tablet Take 1 tablet by mouth daily. 10/03/16   Plotnikov, Georgina Quint, MD  benzonatate (TESSALON) 100 MG capsule Take 1 capsule (100 mg total) by mouth 2 (two) times daily as needed for cough. 08/20/17   Myrlene Broker, MD  calcium carbonate (OSCAL) 1500 (600 Ca) MG TABS tablet Take 1 tablet (1,500 mg total) by mouth 2 (two) times daily with a meal. 07/31/16   Nche, Bonna Gains, NP  carbidopa-levodopa (SINEMET IR) 25-250 MG tablet Take 1 tablet by mouth 3 (three) times daily. 04/16/17   Plotnikov, Georgina Quint, MD  chlorhexidine (PERIDEX) 0.12 % solution RINSE WITH 1/2 OUNCE TWICE DAILY FOR 30 SECONDS AND SPIT OUT AFTER BREAKFAST AND BEFORE BED 10/31/16   [provider]  cholecalciferol (VITAMIN D) 1000 UNITS tablet Take 2 tablets (2,000 Units total) by mouth daily. 03/15/15   Plotnikov, Georgina Quint, MD  glucose blood (ONETOUCH VERIO) test strip Use to check blood sugars  twice a day. Dx E11.9 05/02/17   Plotnikov, Georgina Quint, MD  HYDROmorphone (DILAUDID) 2 MG tablet Take 2-3 tablets (4-6 mg total) by mouth every 6 (six) hours as needed for moderate pain or severe pain. Patient taking differently: Take 2 mg by mouth every 8 (eight) hours as needed for moderate pain or severe pain.  06/24/14   Teryl Lucy, MD  LORazepam (ATIVAN) 2 MG tablet TAKE ONE-HALF TO ONE TABLET THREE TIMES A DAY 09/19/17   Plotnikov, Georgina Quint, MD  metFORMIN (GLUCOPHAGE-XR) 500 MG 24 hr tablet Take 1 tablet (500 mg total) by mouth 3 (three) times daily. 04/16/17   Plotnikov, Georgina Quint, MD  metoprolol tartrate (LOPRESSOR) 25 MG tablet Take 0.5 tablets (12.5 mg total) 2 (two) times daily by mouth. 08/14/17  Plotnikov, Georgina Quint, MD  mirtazapine (REMERON) 15 MG tablet TAKE 1 TABLET BY MOUTH AT BEDTIME AT 6-8 PM 05/29/17   Plotnikov, Georgina Quint, MD  [facility] DELICA LANCETS 33G MISC Use to help check blood sugars twice a day. Dx E11.9 10/23/16   Plotnikov, Georgina Quint, MD  PROLIA 60 MG/ML SOLN injection  04/05/17   [provider]  promethazine (PHENERGAN) 12.5 MG tablet Take 1 tablet (12.5 mg total) by mouth every 6 (six) hours as needed. for nausea 08/02/16   Nche, Bonna Gains, NP  promethazine-codeine (PHENERGAN WITH CODEINE) 6.25-10 MG/5ML syrup TAKE 5 MLS BY MOUTH EVERY 4 (FOUR) HOURS AS NEEDED. 10/02/17   Plotnikov, Georgina Quint, MD  repaglinide (PRANDIN) 2 MG tablet Take 1 tablet (2 mg total) by mouth 3 (three) times daily before meals. 04/17/17   Plotnikov, Georgina Quint, MD  tamsulosin (FLOMAX) 0.4 MG CAPS capsule Take 1 capsule (0.4 mg total) daily by mouth. 08/14/17   Plotnikov, Georgina Quint, MD  tolterodine (DETROL LA) 4 MG 24 hr capsule TAKE ONE CAPSULE AT BEDTIME 01/24/16   Plotnikov, Georgina Quint, MD  umeclidinium-vilanterol (ANORO ELLIPTA) 62.5-25 MCG/INH AEPB Inhale 1 puff into the lungs daily. 10/03/16   Plotnikov, Georgina Quint, MD  Vitamin D, Ergocalciferol, (DRISDOL) 50000 units CAPS capsule TAKE 1  CAPSULE (50,000 UNITS TOTAL) BY MOUTH ONCE A WEEK. 03/28/16   Plotnikov, Georgina Quint, MD  zolpidem (AMBIEN) 10 MG tablet TAKE 1 TABLET BY MOUTH EVERY DAY AT BEDTIME AS NEEDED FOR SLEEP 07/30/17   Plotnikov, Georgina Quint, MD    Family History Family History  Problem Relation Age of Onset  . Stroke Mother   . Heart disease Father 27       MI  . Hypertension Other     Social History Social History   Tobacco Use  . Smoking status: Former Smoker    Packs/day: 1.00    Years: 20.00    Pack years: 20.00    Types: Cigarettes    Last attempt to quit: 07/18/2016    Years since quitting: 1.2  . Smokeless tobacco: Never Used  Substance Use Topics  . Alcohol use: Yes    Alcohol/week: 6.0 oz    Types: 10 Glasses of wine per week    Comment: 1 glass of wine daily  . Drug use: No     Allergies   Asa [aspirin] and Prednisone   Review of Systems Review of Systems  All other systems reviewed and are negative.    Physical Exam Updated Vital Signs BP 129/75   Pulse 73   Temp 98.8 F (37.1 C) (Oral)   Resp 17   Ht 5\' 5"  (1.651 m)   Wt 60.8 kg (134 lb)   SpO2 98%   BMI 22.30 kg/m   Physical Exam  Constitutional: She is oriented to person, place, and time. She appears well-developed and well-nourished.  HENT:  Head: Normocephalic and atraumatic.  Eyes: EOM are normal. Pupils are equal, round, and reactive to light.  Cardiovascular: Normal rate and regular rhythm.  No murmur heard. Pulmonary/Chest: Effort normal and breath sounds normal. No respiratory distress.  Abdominal: Soft. There is no tenderness. There is no rebound and no guarding.  Musculoskeletal: She exhibits no edema or tenderness.  2+ radial pulses bilaterally.  There is no discrete tenderness to palpation throughout the right shoulder but there is pain with abduction of the right shoulder.  Neurological: She is alert and oriented to person, place, and time.  No asymmetry of  facial muscles.  5 out of 5 strength in  all 4 extremities.  Unable to test pronator drift due to right shoulder pain.  Sensation light touch intact in all 4 extremities.  Skin: Skin is warm and dry.  Psychiatric: She has a normal mood and affect. Her behavior is normal.  Nursing note and vitals reviewed.    ED Treatments / Results  Labs (all labs ordered are listed, but only abnormal results are displayed) Labs Reviewed - No data to display  EKG  EKG Interpretation  Date/Time:  Thursday October 03 2017 17:04:15 EST Ventricular Rate:  73 PR Interval:    QRS Duration: 88 QT Interval:  396 QTC Calculation: 437 R Axis:   53 Text Interpretation:  Sinus rhythm Consider left atrial enlargement Low voltage, precordial leads Confirmed by Tilden Fossa [phone]) on 10/03/2017 6:02:02 PM       Radiology No results found.  Procedures Procedures (including critical care time)  Medications Ordered in ED Medications - No data to display   Initial Impression / Assessment and Plan / ED Course  I have reviewed the triage vital signs and the nursing notes.  Pertinent labs & imaging results that were available during my care of the patient were reviewed by me and considered in my medical decision making (see chart for details).     Patient here for evaluation following episode of transient difficulty with speaking as well as stiffening in bilateral upper extremities.  She is back at her neurologic baseline on evaluation in the emergency department.  Presentation is not consistent with TIA or CVA.  UA is consistent with UTI  -question if developing UTI contributed to symptoms earlier today.  On initial presentation she had no evidence of sepsis or serious bacterial infection with stable vital signs as well as normal lactic.  Initial pain to discharge home.   She is on pain medications for chronic back and shoulder pain.  She was given an IV dose of her home medication in the department due to her chronic pain and missed medication  doses during her ED stay.  About 1-1/2 hours after she received the pain medication she reported feeling significant malaise and feeling of unwellness.  Her blood pressure was noted to be in the 60s over 50s and oxygen sats in the upper 80s with a heart rate in the 60s-70s.  She Mente did well throughout this episode but did appear uncomfortable.  She was treated with IV fluid bolus as well as supplemental oxygen and her hypotension did resolve.  It is unclear if the hypotensive spell was secondary to the pain medications received earlier or if she is developing worsening infection.  Given her history of septic shock in the past and her persistent feeling of unwellness hospitalist consulted for observation.  Final Clinical Impressions(s) / ED Diagnoses   Final diagnoses:  None    ED Discharge Orders    None       Tilden Fossa, MD 10/04/17 [phone]

## 2017-10-03 NOTE — ED Triage Notes (Signed)
Pt arrived via [facility] from home where pt had witnessed episode @ 345 today where she had sudden onset of aphasia with contraction of arms per friend. Pt reports she was watching TV when everything became blurry and went dark. She reports her friend was asking her questions and the pt was unable to answer. EMS reports left sided facial droop and weakness that is chronic.

## 2017-10-03 NOTE — ED Notes (Signed)
ED Provider at bedside. 

## 2017-10-04 ENCOUNTER — Encounter (HOSPITAL_COMMUNITY): Payer: Self-pay | Admitting: Internal Medicine

## 2017-10-04 ENCOUNTER — Observation Stay (HOSPITAL_COMMUNITY): Payer: Medicare Other

## 2017-10-04 ENCOUNTER — Other Ambulatory Visit: Payer: Self-pay

## 2017-10-04 DIAGNOSIS — G9340 Encephalopathy, unspecified: Secondary | ICD-10-CM | POA: Diagnosis present

## 2017-10-04 DIAGNOSIS — G40909 Epilepsy, unspecified, not intractable, without status epilepticus: Secondary | ICD-10-CM | POA: Diagnosis present

## 2017-10-04 DIAGNOSIS — I700 Atherosclerosis of aorta: Secondary | ICD-10-CM | POA: Diagnosis present

## 2017-10-04 DIAGNOSIS — E119 Type 2 diabetes mellitus without complications: Secondary | ICD-10-CM | POA: Diagnosis present

## 2017-10-04 DIAGNOSIS — E785 Hyperlipidemia, unspecified: Secondary | ICD-10-CM | POA: Diagnosis present

## 2017-10-04 DIAGNOSIS — M1990 Unspecified osteoarthritis, unspecified site: Secondary | ICD-10-CM | POA: Diagnosis present

## 2017-10-04 DIAGNOSIS — G931 Anoxic brain damage, not elsewhere classified: Secondary | ICD-10-CM

## 2017-10-04 DIAGNOSIS — G8929 Other chronic pain: Secondary | ICD-10-CM | POA: Diagnosis present

## 2017-10-04 DIAGNOSIS — M25511 Pain in right shoulder: Secondary | ICD-10-CM | POA: Diagnosis present

## 2017-10-04 DIAGNOSIS — Z87891 Personal history of nicotine dependence: Secondary | ICD-10-CM | POA: Diagnosis not present

## 2017-10-04 DIAGNOSIS — J449 Chronic obstructive pulmonary disease, unspecified: Secondary | ICD-10-CM | POA: Diagnosis present

## 2017-10-04 DIAGNOSIS — I959 Hypotension, unspecified: Secondary | ICD-10-CM | POA: Diagnosis present

## 2017-10-04 DIAGNOSIS — Z886 Allergy status to analgesic agent status: Secondary | ICD-10-CM | POA: Diagnosis not present

## 2017-10-04 DIAGNOSIS — Z993 Dependence on wheelchair: Secondary | ICD-10-CM | POA: Diagnosis not present

## 2017-10-04 DIAGNOSIS — Z79899 Other long term (current) drug therapy: Secondary | ICD-10-CM | POA: Diagnosis not present

## 2017-10-04 DIAGNOSIS — Z7982 Long term (current) use of aspirin: Secondary | ICD-10-CM | POA: Diagnosis not present

## 2017-10-04 DIAGNOSIS — G259 Extrapyramidal and movement disorder, unspecified: Secondary | ICD-10-CM | POA: Diagnosis present

## 2017-10-04 DIAGNOSIS — N390 Urinary tract infection, site not specified: Secondary | ICD-10-CM

## 2017-10-04 DIAGNOSIS — Z888 Allergy status to other drugs, medicaments and biological substances status: Secondary | ICD-10-CM | POA: Diagnosis not present

## 2017-10-04 DIAGNOSIS — M542 Cervicalgia: Secondary | ICD-10-CM | POA: Diagnosis present

## 2017-10-04 DIAGNOSIS — R4182 Altered mental status, unspecified: Secondary | ICD-10-CM | POA: Diagnosis not present

## 2017-10-04 DIAGNOSIS — E1100 Type 2 diabetes mellitus with hyperosmolarity without nonketotic hyperglycemic-hyperosmolar coma (NKHHC): Secondary | ICD-10-CM

## 2017-10-04 DIAGNOSIS — Z7984 Long term (current) use of oral hypoglycemic drugs: Secondary | ICD-10-CM | POA: Diagnosis not present

## 2017-10-04 DIAGNOSIS — Z87820 Personal history of traumatic brain injury: Secondary | ICD-10-CM | POA: Diagnosis not present

## 2017-10-04 DIAGNOSIS — B9620 Unspecified Escherichia coli [E. coli] as the cause of diseases classified elsewhere: Secondary | ICD-10-CM | POA: Diagnosis present

## 2017-10-04 DIAGNOSIS — F319 Bipolar disorder, unspecified: Secondary | ICD-10-CM | POA: Diagnosis present

## 2017-10-04 DIAGNOSIS — A419 Sepsis, unspecified organism: Secondary | ICD-10-CM | POA: Diagnosis present

## 2017-10-04 DIAGNOSIS — R531 Weakness: Secondary | ICD-10-CM

## 2017-10-04 DIAGNOSIS — R404 Transient alteration of awareness: Secondary | ICD-10-CM | POA: Diagnosis not present

## 2017-10-04 HISTORY — DX: Atherosclerosis of aorta: I70.0

## 2017-10-04 LAB — LACTIC ACID, PLASMA: Lactic Acid, Venous: 2 mmol/L (ref 0.5–1.9)

## 2017-10-04 LAB — CBC
HCT: 38.2 % (ref 36.0–46.0)
Hemoglobin: 11.9 g/dL — ABNORMAL LOW (ref 12.0–15.0)
MCH: 27.9 pg (ref 26.0–34.0)
MCHC: 31.2 g/dL (ref 30.0–36.0)
MCV: 89.5 fL (ref 78.0–100.0)
Platelets: 291 10*3/uL (ref 150–400)
RBC: 4.27 MIL/uL (ref 3.87–5.11)
RDW: 15.7 % — ABNORMAL HIGH (ref 11.5–15.5)
WBC: 8.8 10*3/uL (ref 4.0–10.5)

## 2017-10-04 LAB — CORTISOL: Cortisol, Plasma: 16.8 ug/dL

## 2017-10-04 LAB — CBG MONITORING, ED
Glucose-Capillary: 157 mg/dL — ABNORMAL HIGH (ref 65–99)
Glucose-Capillary: 170 mg/dL — ABNORMAL HIGH (ref 65–99)

## 2017-10-04 LAB — HEMOGLOBIN A1C
Hgb A1c MFr Bld: 7.5 % — ABNORMAL HIGH (ref 4.8–5.6)
Mean Plasma Glucose: 168.55 mg/dL

## 2017-10-04 LAB — CREATININE, SERUM
Creatinine, Ser: 0.72 mg/dL (ref 0.44–1.00)
GFR calc Af Amer: >60 mL/min (ref >60)
GFR calc non Af Amer: >60 mL/min (ref >60)

## 2017-10-04 LAB — I-STAT CG4 LACTIC ACID, ED
Lactic Acid, Venous: 1.28 mmol/L (ref 0.5–1.9)
Lactic Acid, Venous: 1.57 mmol/L (ref 0.5–1.9)

## 2017-10-04 LAB — TROPONIN I
Troponin I: <0.03 ng/mL (ref <0.03)
Troponin I: <0.03 ng/mL (ref <0.03)

## 2017-10-04 LAB — GLUCOSE, CAPILLARY
Glucose-Capillary: 148 mg/dL — ABNORMAL HIGH (ref 65–99)
Glucose-Capillary: 153 mg/dL — ABNORMAL HIGH (ref 65–99)

## 2017-10-04 LAB — PROCALCITONIN: Procalcitonin: <0.1 ng/mL

## 2017-10-04 MED ORDER — DEXTROSE 5 % IV SOLN
1.0000 g | Freq: Every day | INTRAVENOUS | Status: DC
  Administered 2017-10-04: 1 g via INTRAVENOUS
  Filled 2017-10-04: qty 10

## 2017-10-04 MED ORDER — UMECLIDINIUM-VILANTEROL 62.5-25 MCG/INH IN AEPB
1.0000 | INHALATION_SPRAY | Freq: Every day | RESPIRATORY_TRACT | Status: DC
  Administered 2017-10-05 – 2017-10-06 (×2): 1 via RESPIRATORY_TRACT
  Filled 2017-10-04: qty 14

## 2017-10-04 MED ORDER — PROMETHAZINE HCL 25 MG PO TABS: 12.5000 mg | ORAL_TABLET | Freq: Four times a day (QID) | ORAL | Status: DC | PRN

## 2017-10-04 MED ORDER — SODIUM CHLORIDE 0.9 % IV BOLUS (SEPSIS)
1000.0000 mL | Freq: Once | INTRAVENOUS | Status: AC
  Administered 2017-10-04: 1000 mL via INTRAVENOUS

## 2017-10-04 MED ORDER — VITAMIN D 1000 UNITS PO TABS
2000.0000 [IU] | ORAL_TABLET | Freq: Every day | ORAL | Status: DC
  Administered 2017-10-04 – 2017-10-06 (×3): 2000 [IU] via ORAL
  Filled 2017-10-04 (×3): qty 2

## 2017-10-04 MED ORDER — DM-GUAIFENESIN ER 30-600 MG PO TB12
1.0000 | ORAL_TABLET | Freq: Two times a day (BID) | ORAL | Status: DC
  Administered 2017-10-04 – 2017-10-06 (×5): 1 via ORAL
  Filled 2017-10-04 (×5): qty 1

## 2017-10-04 MED ORDER — INSULIN ASPART 100 UNIT/ML ~~LOC~~ SOLN
0.0000 [IU] | Freq: Three times a day (TID) | SUBCUTANEOUS | Status: DC
  Administered 2017-10-04: 1 [IU] via SUBCUTANEOUS
  Administered 2017-10-05: 2 [IU] via SUBCUTANEOUS
  Administered 2017-10-05: 3 [IU] via SUBCUTANEOUS
  Administered 2017-10-05 – 2017-10-06 (×3): 1 [IU] via SUBCUTANEOUS

## 2017-10-04 MED ORDER — ZOLPIDEM TARTRATE 5 MG PO TABS
5.0000 mg | ORAL_TABLET | Freq: Every evening | ORAL | Status: DC | PRN
  Administered 2017-10-05: 5 mg via ORAL
  Filled 2017-10-04: qty 1

## 2017-10-04 MED ORDER — REPAGLINIDE 2 MG PO TABS
2.0000 mg | ORAL_TABLET | Freq: Three times a day (TID) | ORAL | Status: DC
  Administered 2017-10-04 – 2017-10-06 (×7): 2 mg via ORAL
  Filled 2017-10-04 (×10): qty 1

## 2017-10-04 MED ORDER — MIRTAZAPINE 15 MG PO TABS
15.0000 mg | ORAL_TABLET | Freq: Every day | ORAL | Status: DC
  Administered 2017-10-04 – 2017-10-05 (×2): 15 mg via ORAL
  Filled 2017-10-04 (×3): qty 1

## 2017-10-04 MED ORDER — CARBIDOPA-LEVODOPA 25-250 MG PO TABS
1.0000 | ORAL_TABLET | Freq: Three times a day (TID) | ORAL | Status: DC
  Administered 2017-10-04 – 2017-10-06 (×8): 1 via ORAL
  Filled 2017-10-04 (×11): qty 1

## 2017-10-04 MED ORDER — ACETAMINOPHEN 160 MG/5ML PO SOLN: 650.0000 mg | ORAL | Status: DC | PRN

## 2017-10-04 MED ORDER — ALBUTEROL SULFATE (2.5 MG/3ML) 0.083% IN NEBU: 2.5000 mg | INHALATION_SOLUTION | Freq: Four times a day (QID) | RESPIRATORY_TRACT | Status: DC | PRN

## 2017-10-04 MED ORDER — CEFTRIAXONE SODIUM 1 G IJ SOLR
1.0000 g | INTRAMUSCULAR | Status: DC
Start: 2017-10-05 — End: 2017-10-06
  Administered 2017-10-05 – 2017-10-06 (×2): 1 g via INTRAVENOUS
  Filled 2017-10-04 (×2): qty 10

## 2017-10-04 MED ORDER — CALCIUM CARBONATE 1250 (500 CA) MG PO TABS
1250.0000 mg | ORAL_TABLET | Freq: Two times a day (BID) | ORAL | Status: DC
  Administered 2017-10-04 – 2017-10-06 (×4): 1250 mg via ORAL
  Filled 2017-10-04 (×6): qty 1

## 2017-10-04 MED ORDER — GADOBENATE DIMEGLUMINE 529 MG/ML IV SOLN
15.0000 mL | Freq: Once | INTRAVENOUS | Status: AC | PRN
  Administered 2017-10-04: 12 mL via INTRAVENOUS

## 2017-10-04 MED ORDER — ASPIRIN EC 81 MG PO TBEC
81.0000 mg | DELAYED_RELEASE_TABLET | Freq: Every day | ORAL | Status: DC
  Administered 2017-10-04 – 2017-10-06 (×3): 81 mg via ORAL
  Filled 2017-10-04 (×3): qty 1

## 2017-10-04 MED ORDER — LORAZEPAM 1 MG PO TABS
2.0000 mg | ORAL_TABLET | Freq: Three times a day (TID) | ORAL | Status: DC
  Administered 2017-10-04 – 2017-10-06 (×7): 2 mg via ORAL
  Filled 2017-10-04 (×8): qty 2

## 2017-10-04 MED ORDER — ACETAMINOPHEN 325 MG PO TABS
650.0000 mg | ORAL_TABLET | ORAL | Status: DC | PRN
  Administered 2017-10-04: 650 mg via ORAL
  Filled 2017-10-04: qty 2

## 2017-10-04 MED ORDER — SODIUM CHLORIDE 0.9 % IV SOLN
Freq: Once | INTRAVENOUS | Status: AC
  Administered 2017-10-04: 02:00:00 via INTRAVENOUS

## 2017-10-04 MED ORDER — HYDROMORPHONE HCL 2 MG PO TABS
2.0000 mg | ORAL_TABLET | Freq: Three times a day (TID) | ORAL | Status: DC | PRN
  Administered 2017-10-04 – 2017-10-06 (×4): 2 mg via ORAL
  Filled 2017-10-04 (×4): qty 1

## 2017-10-04 MED ORDER — B COMPLEX-C PO TABS
1.0000 | ORAL_TABLET | Freq: Every day | ORAL | Status: DC
  Administered 2017-10-04 – 2017-10-06 (×3): 1 via ORAL
  Filled 2017-10-04 (×4): qty 1

## 2017-10-04 MED ORDER — ENOXAPARIN SODIUM 40 MG/0.4ML ~~LOC~~ SOLN
40.0000 mg | Freq: Every day | SUBCUTANEOUS | Status: DC
  Administered 2017-10-04 – 2017-10-06 (×3): 40 mg via SUBCUTANEOUS
  Filled 2017-10-04 (×4): qty 0.4

## 2017-10-04 MED ORDER — SODIUM CHLORIDE 0.9 % IV SOLN
INTRAVENOUS | Status: AC
  Administered 2017-10-04: 07:00:00 via INTRAVENOUS

## 2017-10-04 MED ORDER — METOPROLOL TARTRATE 12.5 MG HALF TABLET
12.5000 mg | ORAL_TABLET | Freq: Two times a day (BID) | ORAL | Status: DC
  Administered 2017-10-04 – 2017-10-06 (×5): 12.5 mg via ORAL
  Filled 2017-10-04 (×5): qty 1

## 2017-10-04 MED ORDER — ACETAMINOPHEN 650 MG RE SUPP: 650.0000 mg | RECTAL | Status: DC | PRN

## 2017-10-04 NOTE — ED Notes (Signed)
Phlebotomy at bedside.

## 2017-10-04 NOTE — H&P (Signed)
History and Physical    [name] ZOX:[id number] DOB: 19-May-1951 DOA: 10/03/2017  PCP: Tresa Garter, MD  Patient coming from: Home.  Chief Complaint: Confusion and difficulty speaking.  HPI: [name] is a 67 y.o. female with history of diabetes mellitus type 2, hypertension, chronic back pain and previous history of anoxic brain injury in the setting of sepsis was brought to the ER after patient's home health aide found that patient suddenly became confused unable to talk and had choking episode of the both upper extremities.  This was around 3:50 PM on October 03, 2017.  Lasted for a minute and resolved.  During the episode patient was appearing confused and following the episode patient did not lose consciousness.  Has not had any incontinence of urine or tongue bite.  Patient was brought to the ER.  ED Course: In the ER patient appeared nonfocal CT head was unremarkable.  Patient remained hypotensive and was given fluid bolus and UA showing features concerning for UTI.  Patient was started on ceftriaxone.  Patient is admitted for further observation for management of UTI with patient's blood pressure being low and improving with fluids.  On my exam patient is alert awake but states that she is not feeling well.  Review of Systems: As per HPI, rest all negative.   Past Medical History:  Diagnosis Date  . Anoxic brain injury (HCC)   . Anxiety   . Chronic neck pain   . Chronic pain in shoulder   . COPD (chronic obstructive pulmonary disease) (HCC)   . Depression   . Fracture of left humerus 06/24/2014  . Gait disorder   . GERD (gastroesophageal reflux disease)   . Hernia of abdominal cavity   . 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 Hyperlipidemia   . LBP (low back pain)   . Neuropathy   . Osteoarthritis   . Seizure disorder (HCC)   . Seizures (HCC)   . Type II or unspecified type diabetes mellitus without mention of complication,  not stated as uncontrolled 2010  . Vitamin B 12 deficiency 2011    Past Surgical History:  Procedure Laterality Date  . COLONOSCOPY    . DIAGNOSTIC LAPAROSCOPY     PMH: Exploratory lap  . HERNIA REPAIR     incisional  . ORIF HUMERUS FRACTURE Left 06/24/2014   Procedure: LEFT OPEN REDUCTION INTERNAL FIXATION (ORIF) PROXIMAL HUMERUS FRACTURE;  Surgeon: Eulas Post, MD;  Location: [facility] OR;  Service: Orthopedics;  Laterality: Left;  . PANCREATECTOMY    . TONSILLECTOMY    . TUBAL LIGATION       reports that she quit smoking about 14 months ago. Her smoking use included cigarettes. She has a 20.00 pack-year smoking history. she has never used smokeless tobacco. She reports that she drinks about 6.0 oz of alcohol per week. She reports that she does not use drugs.  Allergies  Allergen Reactions  . Asa [Aspirin] Other (See Comments)    "stomach burning", "I can take baby aspirin"  . Prednisone Other (See Comments)    MD told it gavepancreatitis  and almost died    Family History  Problem Relation Age of Onset  . Stroke Mother   . Heart disease Father 25       MI  . Hypertension Other     Prior to Admission medications   Medication Sig Start Date End Date Taking? Authorizing Provider  albuterol (PROVENTIL HFA;VENTOLIN HFA)  108 (90 Base) MCG/ACT inhaler Inhale 1-2 puffs into the lungs every 6 (six) hours as needed for wheezing or shortness of breath. 08/20/17  Yes Myrlene Brokerrawford, Elizabeth A, MD  aspirin 81 MG EC tablet Take 81 mg by mouth daily.     Yes [provider]  b complex vitamins tablet Take 1 tablet by mouth daily. 10/03/16  Yes Plotnikov, Georgina QuintAleksei V, MD  benzonatate (TESSALON) 100 MG capsule Take 1 capsule (100 mg total) by mouth 2 (two) times daily as needed for cough. 08/20/17  Yes Myrlene Brokerrawford, Elizabeth A, MD  calcium carbonate (OSCAL) 1500 (600 Ca) MG TABS tablet Take 1 tablet (1,500 mg total) by mouth 2 (two) times daily with a meal. 07/31/16  Yes Nche, Bonna Gainsharlotte Lum, NP   carbidopa-levodopa (SINEMET IR) 25-250 MG tablet Take 1 tablet by mouth 3 (three) times daily. 04/16/17  Yes Plotnikov, Georgina QuintAleksei V, MD  cholecalciferol (VITAMIN D) 1000 UNITS tablet Take 2 tablets (2,000 Units total) by mouth daily. 03/15/15  Yes Plotnikov, Georgina QuintAleksei V, MD  glucose blood (ONETOUCH VERIO) test strip Use to check blood sugars twice a day. Dx E11.9 05/02/17  Yes Plotnikov, Georgina QuintAleksei V, MD  HYDROmorphone (DILAUDID) 2 MG tablet Take 2-3 tablets (4-6 mg total) by mouth every 6 (six) hours as needed for moderate pain or severe pain. Patient taking differently: Take 2 mg by mouth every 8 (eight) hours as needed for moderate pain or severe pain.  06/24/14  Yes Teryl LucyLandau, Joshua, MD  LORazepam (ATIVAN) 2 MG tablet TAKE ONE-HALF TO ONE TABLET THREE TIMES A DAY Patient taking differently: TAKE ONE TABLET THREE TIMES A DAY 09/19/17  Yes Plotnikov, Georgina QuintAleksei V, MD  metFORMIN (GLUCOPHAGE-XR) 500 MG 24 hr tablet Take 1 tablet (500 mg total) by mouth 3 (three) times daily. 04/16/17  Yes Plotnikov, Georgina QuintAleksei V, MD  metoprolol tartrate (LOPRESSOR) 25 MG tablet Take 0.5 tablets (12.5 mg total) 2 (two) times daily by mouth. 08/14/17  Yes Plotnikov, Georgina QuintAleksei V, MD  mirtazapine (REMERON) 15 MG tablet TAKE 1 TABLET BY MOUTH AT BEDTIME AT 6-8 PM 05/29/17  Yes Plotnikov, Georgina QuintAleksei V, MD  [facility] DEL[facility] LANCETS 33G MISC Use to help check blood sugars twice a day. Dx E11.9 10/23/16  Yes Plotnikov, Georgina QuintAleksei V, MD  promethazine (PHENERGAN) 12.5 MG tablet Take 1 tablet (12.5 mg total) by mouth every 6 (six) hours as needed. for nausea 08/02/16  Yes Nche, Bonna Gainsharlotte Lum, NP  promethazine-codeine (PHENERGAN WITH CODEINE) 6.25-10 MG/5ML syrup TAKE 5 MLS BY MOUTH EVERY 4 (FOUR) HOURS AS NEEDED. Patient taking differently: Take 5 mLs by mouth every 4 (four) hours as needed for cough.  10/02/17  Yes Plotnikov, Georgina QuintAleksei V, MD  repaglinide (PRANDIN) 2 MG tablet Take 1 tablet (2 mg total) by mouth 3 (three) times daily before meals. 04/17/17  Yes  Plotnikov, Georgina QuintAleksei V, MD  umeclidinium-vilanterol (ANORO ELLIPTA) 62.5-25 MCG/INH AEPB Inhale 1 puff into the lungs daily. 10/03/16  Yes Plotnikov, Georgina QuintAleksei V, MD  Vitamin D, Ergocalciferol, (DRISDOL) 50000 units CAPS capsule TAKE 1 CAPSULE (50,000 UNITS TOTAL) BY MOUTH ONCE A WEEK. 03/28/16  Yes Plotnikov, Georgina QuintAleksei V, MD  zolpidem (AMBIEN) 10 MG tablet TAKE 1 TABLET BY MOUTH EVERY DAY AT BEDTIME AS NEEDED FOR SLEEP 07/30/17  Yes Plotnikov, Georgina QuintAleksei V, MD    Physical Exam: Vitals:   10/04/17 0345 10/04/17 0400 10/04/17 0415 10/04/17 0430  BP: (!) 132/109 136/77 130/65 128/77  Pulse: 89 85 84 77  Resp: 11 12 18 16   Temp:  TempSrc:      SpO2: 97% 97% 96% 98%  Weight:      Height:          Constitutional: Moderately built and nourished. Vitals:   10/04/17 0345 10/04/17 0400 10/04/17 0415 10/04/17 0430  BP: (!) 132/109 136/77 130/65 128/77  Pulse: 89 85 84 77  Resp: 11 12 18 16   Temp:      TempSrc:      SpO2: 97% 97% 96% 98%  Weight:      Height:       Eyes: Anicteric no pallor. ENMT: No discharge from the ears eyes nose or mouth. Neck: No mass palpated no neck rigidity. Respiratory: No rhonchi or crepitations. Cardiovascular: S1-S2 heard no murmurs appreciated. Abdomen: Soft nontender bowel sounds present. Musculoskeletal: No edema.  No joint effusion. Skin: No rash.  Skin appears warm. Neurologic: Alert awake oriented to time place and person.  Moves all extremities with mild generalized weakness.  No facial asymmetry.  Tongue is midline.  Pupils are reacting to light. Psychiatric: Appears normal.   Labs on Admission: I have personally reviewed following labs and imaging studies  CBC: Recent Labs  Lab 10/03/17 1856  WBC 7.9  NEUTROABS 5.3  HGB 12.2  HCT 38.7  MCV 89.0  PLT 292   Basic Metabolic Panel: Recent Labs  Lab 10/03/17 1856  NA 134*  K 4.4  CL 97*  CO2 27  GLUCOSE 129*  BUN 13  CREATININE 0.70  CALCIUM 9.4   GFR: Estimated Creatinine  Clearance: 61.4 mL/min (by C-G formula based on SCr of 0.7 mg/dL). Liver Function Tests: Recent Labs  Lab 10/03/17 1856  AST 19  ALT 14  ALKPHOS 71  BILITOT 0.5  PROT 6.8  ALBUMIN 4.0   No results for input(s): LIPASE, AMYLASE in the last 168 hours. No results for input(s): AMMONIA in the last 168 hours. Coagulation Profile: No results for input(s): INR, PROTIME in the last 168 hours. Cardiac Enzymes: No results for input(s): CKTOTAL, CKMB, CKMBINDEX, TROPONINI in the last 168 hours. BNP (last 3 results) No results for input(s): PROBNP in the last 8760 hours. HbA1C: No results for input(s): HGBA1C in the last 72 hours. CBG: Recent Labs  Lab 10/04/17 0037  GLUCAP 170*   Lipid Profile: No results for input(s): CHOL, HDL, LDLCALC, TRIG, CHOLHDL, LDLDIRECT in the last 72 hours. Thyroid Function Tests: No results for input(s): TSH, T4TOTAL, FREET4, T3FREE, THYROIDAB in the last 72 hours. Anemia Panel: No results for input(s): VITAMINB12, FOLATE, FERRITIN, TIBC, IRON, RETICCTPCT in the last 72 hours. Urine analysis:    Component Value Date/Time   COLORURINE AMBER (A) 10/03/2017 1856   APPEARANCEUR CLOUDY (A) 10/03/2017 1856   LABSPEC 1.017 10/03/2017 1856   PHURINE 6.0 10/03/2017 1856   GLUCOSEU NEGATIVE 10/03/2017 1856   GLUCOSEU NEGATIVE 12/24/2012 1626   HGBUR NEGATIVE 10/03/2017 1856   BILIRUBINUR NEGATIVE 10/03/2017 1856   BILIRUBINUR Neg 11/06/2016 1159   KETONESUR 5 (A) 10/03/2017 1856   PROTEINUR NEGATIVE 10/03/2017 1856   UROBILINOGEN negative 11/06/2016 1159   UROBILINOGEN 0.2 12/24/2012 1626   NITRITE POSITIVE (A) 10/03/2017 1856   LEUKOCYTESUR MODERATE (A) 10/03/2017 1856   Sepsis Labs: @LABRCNTIP (procalcitonin:4,lacticidven:4) )No results found for this or any previous visit (from the past 240 hour(s)).   Radiological Exams on Admission: Dg Chest 2 View  Result Date: 10/03/2017 CLINICAL DATA:  Cough and confusion earlier today. Right upper chest pain.  Sudden onset of aphasia. Blurry vision. Left-sided facial droop and weakness  is chronic. EXAM: CHEST  2 VIEW COMPARISON:  11/08/2016 FINDINGS: Shallow inspiration. Heart size and pulmonary vascularity are normal. Calcification of the aorta. No airspace disease or consolidation in the lungs. Postoperative changes in the right humerus and left upper quadrant. Degenerative changes in the spine. IMPRESSION: No evidence of active pulmonary disease. Shallow inspiration. Aortic atherosclerosis. Electronically Signed   By: Burman Nieves M.D.   On: 10/03/2017 19:37   Ct Head Wo Contrast  Result Date: 10/03/2017 CLINICAL DATA:  Episode of aphasia with contraction of arms around 4 p.m. today. History of seizures. EXAM: CT HEAD WITHOUT CONTRAST TECHNIQUE: Contiguous axial images were obtained from the base of the skull through the vertex without intravenous contrast. COMPARISON:  Brain MRI 03/30/2011 FINDINGS: Brain: No evidence of acute infarction, hemorrhage, hydrocephalus, extra-axial collection or mass lesion/mass effect. White matter volume loss with generalized thinning of the corpus callosum that is advanced. There is associated low-density in the bilateral cerebral white matter. Patient has history of anoxic brain injury. Vascular: Atherosclerotic calcification.  No hyperdense vessel. Skull: No acute or aggressive finding. Sinuses/Orbits: Chronic patchy appearing opacification of paranasal sinuses. The hypoplastic right sphenoid sinus is completely opacified. IMPRESSION: 1. No acute finding. 2. 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 Electronically Signed   By: Marnee Spring M.D.   On: 10/03/2017 19:32    EKG: Independently reviewed.  Normal sinus rhythm.  Assessment/Plan Principal Problem:   Acute encephalopathy Active Problems:   Weakness   Acute lower UTI    1. Acute encephalopathy - with a period of difficulty speaking with jerking spell spell of the upper extremities concerning  for seizure.  Patient has history of seizure as per the patient has been when patient was had a diagnosis of septic shock in 90s and was placed on Ativan.  Patient is still taking it.  Will order MRI brain with and without contrast and check EEG.  We discussed with neurologist in a.m. for further recommendations. 2. Hypotension with UTI concerning for developing sepsis.  Patient is placed on IV fluids follow cultures continue with ceftriaxone until cultures are available.  Patient was recently placed on steroids for bronchitis so we will check cortisol levels.  Check troponin. 3. History of hypertension -may have to hold metoprolol if patient continues to remain hypotensive. 4. Diabetes mellitus type 2 -we will hold metformin while inpatient and continue Prandin pre-meals with sliding scale coverage. 5. 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 6. Chronic back pain on hydromorphone. 7. Was recently placed on inhalers for bronchitis which patient states helped her and will continue.  Check cortisol level since patient was on steroids recently.   DVT prophylaxis: Lovenox. Code Status: Full code. Family Communication: Discussed with patient. Disposition Plan: To be determined. Consults called: None. Admission status: Observation.   Eduard Clos MD [facility] Pager [phone].  If 7PM-7AM, please contact night-coverage [email or web address] Password [email or web address]  10/04/2017, 4:53 AM

## 2017-10-04 NOTE — Progress Notes (Addendum)
Progress Note    [name]  ZOX:[id number] DOB: [birth year]  DOA: 10/03/2017 PCP: Tresa Garter, MD    Brief Narrative:   Chief complaint: F/U acute encephalopathy/UTI  Medical records reviewed and are as summarized below:  [name] is an 67 y.o. female with a PMH of diabetes mellitus type 2, hypertension, chronic back pain and previous history of anoxic brain injury in the setting of sepsis who was admitted 10/03/17 after patient's home health aide found that patient suddenly became confused, unable to talk and had choking episode. Upon presentation to the ED, the patient was noted to be hypotensive and was given an IVF bolus. CT of the head was unremarkable. UA concerning for UTI.  Assessment/Plan:   Principal Problem:   Acute encephalopathy and weakness likely from acute lower UTI Differential included stroke, UTI, and postictal status. CT/MRI of brain negative for stroke. UA + nitrites, mod leukocytes and many bacteria. F/U urine cultures. On empiric Rocephin. EEG ordered to rule out seizure. VSS.  Active Problems:   Right shoulder pain/aortic atherosclerosis Differential includes angina versus lung process. No history of injury, and noted to have chronic shoulder pain. Troponin negative. Patient does have a congested cough and may have referred pain from bronchitis or pleurisy. Chest x-ray negative for pneumonia but was not a good quality film. We'll place on Mucinex.    COPD Continue Anoro and PRN nebs.    DM2 (diabetes mellitus, type 2) (HCC) Currently being managed by insulin sensitive SSI and usual home dose of Prandin. Glucophage on hold. Glucose 170. Last hemoglobin A1c done 04/16/17 and was 9.3% indicating poor control. Repeat.    Anoxic brain injury [facility]) Wheelchair bound at baseline. Has a h/o seizures. EEG ordered.  Body mass index is 22.3 kg/m.   Family Communication/Anticipated D/C date and plan/Code Status   DVT prophylaxis: Lovenox  ordered. Code Status: Full Code.  Family Communication: No family at the bedside. Disposition Plan: Home when mental status improves, likely another 24-48 hours.   Medical Consultants:    None.   Anti-Infectives:    Rocephin 10/03/17--->  Subjective:   Complains of right shoulder pain. Also reports having had difficulty going through with MRI this morning. Noted to be coughing on exam.  Objective:    Vitals:   10/04/17 0830 10/04/17 0930 10/04/17 1000 10/04/17 1015  BP: (!) 145/72 134/70 139/70 139/70  Pulse: 85 (!) 121 81 86  Resp: 13 17 15    Temp:      TempSrc:      SpO2: 100% 92% 100%   Weight:      Height:        Intake/Output Summary (Last 24 hours) at 10/04/2017 1104 Last data filed at 10/04/2017 0738 Gross per 24 hour  Intake 1100 ml  Output -  Net 1100 ml   Filed Weights   10/03/17 1706  Weight: 60.8 kg (134 lb)    Exam: General: No acute distress. Cardiovascular: Heart sounds show a regular rate, and rhythm. No gallops or rubs. No murmurs. No JVD. Lungs: Clear to auscultation bilaterally with good air movement. No rales, rhonchi or wheezes. Abdomen: Soft, nontender, nondistended with normal active bowel sounds. No masses. No hepatosplenomegaly. Neurological: Alert and oriented 2, slow to respond. Moves all extremities 4 with equal strength but generalized weakness. Cranial nerves II through XII grossly intact. Skin: Warm and dry. No rashes or lesions. Extremities: No clubbing or cyanosis. No edema. Pedal pulses 2+. Psychiatric: Mood and  affect are flat. Insight and judgment are mildly impaired.   Data Reviewed:   I have personally reviewed following labs and imaging studies:  Labs: Labs show the following:   Basic Metabolic Panel: Recent Labs  Lab 10/03/17 1856 10/04/17 0451  NA 134*  --   K 4.4  --   CL 97*  --   CO2 27  --   GLUCOSE 129*  --   BUN 13  --   CREATININE 0.70 0.72  CALCIUM 9.4  --    GFR Estimated Creatinine  Clearance: 61.4 mL/min (by C-G formula based on SCr of 0.72 mg/dL). Liver Function Tests: Recent Labs  Lab 10/03/17 1856  AST 19  ALT 14  ALKPHOS 71  BILITOT 0.5  PROT 6.8  ALBUMIN 4.0    CBC: Recent Labs  Lab 10/03/17 1856 10/04/17 0451  WBC 7.9 8.8  NEUTROABS 5.3  --   HGB 12.2 11.9*  HCT 38.7 38.2  MCV 89.0 89.5  PLT 292 291   Cardiac Enzymes: Recent Labs  Lab 10/04/17 0636  TROPONINI <0.03   CBG: Recent Labs  Lab 10/04/17 0037  GLUCAP 170*   Sepsis Labs: Recent Labs  Lab 10/03/17 1856 10/04/17 0044 10/04/17 0355 10/04/17 0451 10/04/17 0636  PROCALCITON  --   --   --   --  <0.10  WBC 7.9  --   --  8.8  --   LATICACIDVEN  --  1.57 1.28  --  2.0*    Microbiology No results found for this or any previous visit (from the past 240 hour(s)).  Procedures and diagnostic studies:  Dg Chest 2 View  Result Date: 10/03/2017 CLINICAL DATA:  Cough and confusion earlier today. Right upper chest pain. Sudden onset of aphasia. Blurry vision. Left-sided facial droop and weakness is chronic. EXAM: CHEST  2 VIEW COMPARISON:  11/08/2016 FINDINGS: Shallow inspiration. Heart size and pulmonary vascularity are normal. Calcification of the aorta. No airspace disease or consolidation in the lungs. Postoperative changes in the right humerus and left upper quadrant. Degenerative changes in the spine. IMPRESSION: No evidence of active pulmonary disease. Shallow inspiration. Aortic atherosclerosis. Electronically Signed   By: Burman Nieves M.D.   On: 10/03/2017 19:37   Ct Head Wo Contrast  Result Date: 10/03/2017 CLINICAL DATA:  Episode of aphasia with contraction of arms around 4 p.m. today. History of seizures. EXAM: CT HEAD WITHOUT CONTRAST TECHNIQUE: Contiguous axial images were obtained from the base of the skull through the vertex without intravenous contrast. COMPARISON:  Brain MRI 03/30/2011 FINDINGS: Brain: No evidence of acute infarction, hemorrhage, hydrocephalus,  extra-axial collection or mass lesion/mass effect. White matter volume loss with generalized thinning of the corpus callosum that is advanced. There is associated low-density in the bilateral cerebral white matter. Patient has history of anoxic brain injury. Vascular: Atherosclerotic calcification.  No hyperdense vessel. Skull: No acute or aggressive finding. Sinuses/Orbits: Chronic patchy appearing opacification of paranasal sinuses. The hypoplastic right sphenoid sinus is completely opacified. IMPRESSION: 1. No acute finding. 2. 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 Electronically Signed   By: Marnee Spring M.D.   On: 10/03/2017 19:32   Mr Laqueta Jean ZO Contrast  Result Date: 10/04/2017 CLINICAL DATA:  Initial evaluation for acute altered mental status. EXAM: MRI HEAD WITHOUT AND WITH CONTRAST TECHNIQUE: Multiplanar, multiecho pulse sequences of the brain and surrounding structures were obtained without and with intravenous contrast. CONTRAST:  12mL MULTIHANCE GADOBENATE DIMEGLUMINE 529 MG/ML IV SOLN COMPARISON:  Prior CT  from 10/03/2017. FINDINGS: Brain: Study mildly degraded by motion artifact. Diffuse prominence of the CSF containing spaces compatible with generalized cerebral atrophy. Patchy and confluent T2/FLAIR hyperintensity within the periventricular and deep white matter both cerebral hemispheres, most consistent with chronic small vessel ischemic disease, mild to moderate nature. Superimposed remote lacunar infarcts present within the bilateral periventricular white matter. No abnormal foci of restricted diffusion to suggest acute or subacute ischemia. Gray-white matter differentiation maintained. No areas of remote cortical infarction. No evidence for acute or chronic intracranial hemorrhage. No mass lesion, midline shift or mass effect. Mild ventricular prominence related global parenchymal volume loss of hydrocephalus. No extra-axial fluid collection. No abnormal enhancement.  Major dural sinuses are grossly patent. Pituitary gland suprasellar region normal. Midline structures intact and normal. Vascular: Major intracranial vascular flow voids maintained at the skullbase. Skull and upper cervical spine: Craniocervical junction normal. Upper cervical spine normal. Bone marrow signal intensity within normal limits. No scalp soft tissue abnormality. Sinuses/Orbits: Globes and orbital soft tissues within normal limits. Scattered mucosal thickening within the ethmoidal air cells and maxillary sinuses. Small bilateral mastoid effusions, of doubtful significance. Inner ear structures normal. Other: None. IMPRESSION: 1. No acute intracranial abnormality. 2. Generalized age-related cerebral atrophy with mild to moderate chronic small vessel ischemic disease. Electronically Signed   By: Rise MuBenjamin  McClintock M.D.   On: 10/04/2017 06:24    Medications:   . aspirin EC  81 mg Oral Daily  . B-complex with vitamin C  1 tablet Oral Daily  . calcium carbonate  1,250 mg Oral BID WC  . carbidopa-levodopa  1 tablet Oral TID WC  . cholecalciferol  2,000 Units Oral Daily  . enoxaparin (LOVENOX) injection  40 mg Subcutaneous Daily  . insulin aspart  0-9 Units Subcutaneous TID WC  . LORazepam  2 mg Oral TID  . metoprolol tartrate  12.5 mg Oral BID  . mirtazapine  15 mg Oral QHS  . repaglinide  2 mg Oral TID AC  . umeclidinium-vilanterol  1 puff Inhalation Daily   Continuous Infusions: . sodium chloride 100 mL/hr at 10/04/17 0658  . [START ON 10/05/2017] cefTRIAXone (ROCEPHIN)  IV       LOS: 0 days   Hillery AldoChristina Rama  [facility] Pager [phone]. If unable to reach me by pager, please call my cell phone at [phone].  *Please refer to [email or web address], password [email or web address] to get updated schedule on who will round on this patient, as hospitalists switch teams weekly. If 7PM-7AM, please contact night-coverage at [email or web address], password [email or web address] for any overnight needs.  10/04/2017, 11:04 AM

## 2017-10-04 NOTE — ED Notes (Signed)
Pt returned from MRI °

## 2017-10-04 NOTE — Procedures (Signed)
ELECTROENCEPHALOGRAM REPORT  Date of Study: 10/04/2017  Patient's Name: [name] MRN: [id number] Date of Birth: [birth year]  Referring Provider: Dr. Midge MiniumArshad Kakrakandy  Clinical History: This is a 67 year old woman with a confusional episode.  Medications: Ativan Tylenol Aspirin Sinemet Ceftriaxone Dilaudid Metformin Metoprolol Remeron Phenergan  Technical Summary: A multichannel digital EEG recording measured by the international 10-20 system with electrodes applied with paste and impedances below 5000 ohms performed as portable with EKG monitoring in an awake and agitated patient.  Hyperventilation and photic stimulation were not performed.  The digital EEG was referentially recorded, reformatted, and digitally filtered in a variety of bipolar and referential montages for optimal display.   Description: The patient is awake and agitated, emotional during the recording.  During maximal wakefulness, there is a symmetric, medium voltage 7 Hz posterior dominant rhythm that attenuates with eye opening. This is admixed with a small amount of diffuse 5-7 Hz thetaslowing of the waking background. Sleep was not captured. Hyperventilation and photic stimulation were not performed. There were no epileptiform discharges or electrographic seizures seen.    EKG lead showed frequent extrasystolic beats.  Impression: This awake EEG is abnormal due to mild diffuse slowing of the waking background.  Clinical Correlation of the above findings indicates diffuse cerebral dysfunction that is non-specific in etiology and can be seen with hypoxic/ischemic injury, toxic/metabolic encephalopathies, neurodegenerative disorders, or medication effect.  The absence of epileptiform discharges does not rule out a clinical diagnosis of epilepsy.  Clinical correlation is advised.   Patrcia DollyKaren Norris Brumbach, M.D.

## 2017-10-04 NOTE — ED Notes (Signed)
Patient transported to MRI 

## 2017-10-04 NOTE — Progress Notes (Signed)
EEG Completed; Results Pending  

## 2017-10-04 NOTE — ED Notes (Addendum)
Pt placed in a hospital bed, ambulated to bed with assistance. Poor mobility with 2 assist. Pure wick cath replaced.

## 2017-10-04 NOTE — Progress Notes (Signed)
PT Cancellation Note  Patient Details Name: Claretta Fraiseanci D Vanhandel MRN: [id number] DOB: 1950-12-07   Cancelled Treatment:    Reason Eval/Treat Not Completed: Patient at procedure or test/unavailable. Pt in process of being set up for an EEG. PT will continue to f/u with pt as available.    Alessandra BevelsJennifer M [place] Mall 10/04/2017, 10:11 AM

## 2017-10-04 NOTE — Progress Notes (Signed)
Pharmacy Antibiotic Note  [name] is a 67 y.o. female admitted on 10/03/2017 with UTI.  Pharmacy has been consulted for ceftriaxone dosing. Received 1x doses last night and this AM at ~0700.  Plan: Ceftriaxone 1g IV q24h Monitor clinical progress, LOT Not renally adjusted - Rx will s/o consult  Height: 5\' 5"  (165.1 cm) Weight: 134 lb (60.8 kg) IBW/kg (Calculated) : 57  Temp (24hrs), Avg:98.8 F (37.1 C), Min:98.8 F (37.1 C), Max:98.8 F (37.1 C)  Recent Labs  Lab 10/03/17 1856 10/04/17 0044 10/04/17 0355 10/04/17 0451 10/04/17 0636  WBC 7.9  --   --  8.8  --   CREATININE 0.70  --   --  0.72  --   LATICACIDVEN  --  1.57 1.28  --  2.0*    Estimated Creatinine Clearance: 61.4 mL/min (by C-G formula based on SCr of 0.72 mg/dL).    Allergies  Allergen Reactions  . Asa [Aspirin] Other (See Comments)    "stomach burning", "I can take baby aspirin"  . Prednisone Other (See Comments)    MD told it gavepancreatitis  and almost died   Babs BertinHaley Manilla Strieter, PharmD, BCPS Clinical Pharmacist 10/04/2017 7:55 AM

## 2017-10-05 LAB — GLUCOSE, CAPILLARY
Glucose-Capillary: 146 mg/dL — ABNORMAL HIGH (ref 65–99)
Glucose-Capillary: 157 mg/dL — ABNORMAL HIGH (ref 65–99)
Glucose-Capillary: 132 mg/dL — ABNORMAL HIGH (ref 65–99)

## 2017-10-05 MED ORDER — SODIUM CHLORIDE 0.9 % IV SOLN
INTRAVENOUS | Status: AC
  Administered 2017-10-05 (×2): 100 mL via INTRAVENOUS
  Administered 2017-10-06: 1000 mL via INTRAVENOUS

## 2017-10-05 NOTE — Progress Notes (Signed)
SLP Cancellation Note  Patient Details Name: [name] MRN: [id number] DOB: 1951-04-13   Cancelled treatment:       Reason Eval/Treat Not Completed: SLP screened, no needs identified, will sign off. Pt and husband feel pt is at her cognitive linguistic baseline. Deny any changes in communication or cognition to warrant SLP evaluation. Will sign off.    Haven Foss, Riley NearingBonnie Caroline 10/05/2017, 1:31 PM

## 2017-10-05 NOTE — Evaluation (Signed)
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 She was admitted from home with some confusion and choking episode without LOC.  Urinalysis concerning for UTI.  Patient is hypotensive and septic.    Clinical Impression   Pt admitted with the above diagnoses and presents with below problem list. Pt will benefit from continued acute OT to address the below listed deficits and maximize independence with basic ADLs prior to d/c home. PTA pt was supervision-min guard with ADLs in the home, used a w/c for longer distances or when she was fatigued. Pt has HH aide during the week while spouse works. Pt is currently mod-max A with most ADLs. Sat EOB several minutes with good toleration but immediately reporting not feeling well upon standing' returned to supine. Spouse present during session and very involved in pt's care.     Follow Up Recommendations  Home health OT;Supervision/Assistance - 24 hour    Equipment Recommendations  None recommended by OT    Recommendations for Other Services       Precautions / Restrictions Precautions Precautions: Fall      Mobility Bed Mobility Overal bed mobility: Needs Assistance Bed Mobility: Supine to Sit;Sit to Supine     Supine to sit: Mod assist;HOB elevated Sit to supine: Mod assist;HOB elevated   General bed mobility comments: +rail, verbal cues for sequencing  Transfers Overall transfer level: Needs assistance Equipment used: Rolling walker (2 wheeled) Transfers: Sit to/from Stand Sit to Stand: Mod assist         General transfer comment: verbal cues for hand placement, assist to power up    Balance Overall balance assessment: Needs  assistance Sitting-balance support: No upper extremity supported;Feet supported Sitting balance-Leahy Scale: Good     Standing balance support: Bilateral upper extremity supported;During functional activity Standing balance-Leahy Scale: Poor Standing balance comment: heavy reliance on RW                           ADL either performed or assessed with clinical judgement   ADL Overall ADL's : Needs assistance/impaired Eating/Feeding: Set up;Sitting   Grooming: Moderate assistance;Sitting   Upper Body Bathing: Moderate assistance;Sitting   Lower Body Bathing: Maximal assistance;Sitting/lateral leans   Upper Body Dressing : Moderate assistance;Sitting   Lower Body Dressing: Maximal assistance;Sitting/lateral leans                 General ADL Comments: Compelted bed mobility and sat EOB 5+ minutes with fair toleration, no physical assist. Pt stood very briefly with rw but unable to maintain stance due to not feeling well on standing. Returned pt to supine.      Vision         Perception     Praxis      Pertinent Vitals/Pain Pain Assessment: Faces Faces Pain Scale: Hurts a little bit Pain Location: BLE with stance Pain Descriptors / Indicators: Spasm;Grimacing;Aching Pain Intervention(s): Limited activity within patient's tolerance;Monitored during session     Hand Dominance Right   Extremity/Trunk Assessment Upper Extremity Assessment Upper Extremity Assessment: RUE deficits/detail;LUE deficits/detail;Generalized weakness RUE Deficits / Details: limited shoulder flexion to about 80*, pain at end range of movement LUE Deficits / Details: limited shoulder flexion to about 80*, pain  at end range of movement. H/o LUE fracture from fall.    Lower Extremity Assessment Lower Extremity Assessment: Defer to PT evaluation   Cervical / Trunk Assessment Cervical / Trunk Assessment: Kyphotic   Communication Communication Communication: No difficulties    Cognition Arousal/Alertness: Awake/alert Behavior During Therapy: WFL for tasks assessed/performed Overall Cognitive Status: Impaired/Different from baseline Area of Impairment: Memory;Attention;Problem solving;Safety/judgement                   Current Attention Level: Sustained Memory: Decreased short-term memory   Safety/Judgement: Decreased awareness of safety   Problem Solving: Slow processing;Difficulty sequencing;Requires verbal cues General Comments: Difficulty following commands with environmental distractors present.    General Comments       Exercises     Shoulder Instructions      Home Living Family/patient expects to be discharged to:: Private residence Living Arrangements: Spouse/significant other Available Help at Discharge: Family;Personal care attendant Type of Home: House Home Access: Ramped entrance     Home Layout: One level     Bathroom Shower/Tub: Walk-in Pensions consultant: Standard Bathroom Accessibility: Yes   Home Equipment: Environmental consultant - 2 wheels;Walker - 4 wheels;Bedside commode;Wheelchair - Sport and exercise psychologist Comments: CNA 9:30-5 M-F      Prior Functioning/Environment Level of Independence: Needs assistance  Gait / Transfers Assistance Needed: Ambulates short distances with RW. Wheelchair used outside of house and in house when fatigued.  ADL's / Homemaking Assistance Needed: set-up, SBA from CNA for all ADLs   Comments: Husband reports multi falls.         OT Problem List: Decreased activity tolerance;Impaired balance (sitting and/or standing);Decreased strength;Decreased range of motion;Decreased cognition;Decreased knowledge of use of DME or AE;Decreased knowledge of precautions;Impaired UE functional use;Pain      OT Treatment/Interventions: Self-care/ADL training;Energy conservation;DME and/or AE instruction;Therapeutic activities;Patient/family education;Balance training    OT Goals(Current  goals can be found in the care plan section) Acute Rehab OT Goals Patient Stated Goal: home OT Goal Formulation: With patient/family Time For Goal Achievement: 10/19/17 Potential to Achieve Goals: Good ADL Goals Pt Will Perform Grooming: sitting;standing;with min assist Pt Will Perform Upper Body Bathing: sitting;with supervision Pt Will Perform Lower Body Bathing: sit to/from stand;with min guard assist Pt Will Transfer to Toilet: with min guard assist;ambulating Pt Will Perform Toileting - Clothing Manipulation and hygiene: with min guard assist;sit to/from stand Pt Will Perform Tub/Shower Transfer: with min guard assist;ambulating;shower seat;rolling walker  OT Frequency: Min 2X/week   Barriers to D/C:            Co-evaluation              AM-PAC PT "6 Clicks" Daily Activity     Outcome Measure Help from another person eating meals?: None Help from another person taking care of personal grooming?: A Lot Help from another person toileting, which includes using toliet, bedpan, or urinal?: A Lot Help from another person bathing (including washing, rinsing, drying)?: A Lot Help from another person to put on and taking off regular upper body clothing?: A Lot Help from another person to put on and taking off regular lower body clothing?: A Lot 6 Click Score: 14   End of Session Equipment Utilized During Treatment: Rolling walker  Activity Tolerance: Other (comment)(poor tolerance of standing position) Patient left: in bed;with call bell/phone within reach;with bed alarm set;with family/visitor present  OT Visit Diagnosis: Unsteadiness on feet (R26.81);History of falling (Z91.81);Muscle weakness (generalized) (M62.81);Pain;Other symptoms and signs involving cognitive function  Time: 1610-9604 OT Time Calculation (min): 22 min Charges:  OT General Charges $OT Visit: 1 Visit OT Evaluation $OT Eval Low Complexity: 1 Low G-Codes:       Pilar Grammes 10/05/2017, 12:47 PM

## 2017-10-05 NOTE — Progress Notes (Signed)
Patient lying supine, alert and orierient. X4 , confused at times, was able to tell me why she was here. . She requested dilaudid for sleep, along with ativan and remerson; states she states this at home for chronic back pain. Will continue to monitor. Bed alarm on, safety maintained.

## 2017-10-05 NOTE — Progress Notes (Signed)
Hospitalist progress note   [name]  ZOX:[id number] DOB: 1951/02/11 DOA: 10/03/2017 PCP: Tresa Garter, MD   Specialists:   Brief Narrative:  67 year old female diabetes mellitus type 2, HTN, chronic pain, CVA with residual left-sided weakness mostly wheelchair-bound, bilateral subdural hematoma 2008 without evac admitted from home with some confusion choking episode without LOC Urinalysis concerning for UTI patient hypotensive and septic   Assessment & Plan:   Assessment:  The primary encounter diagnosis was Acute UTI. A diagnosis of Transient alteration of awareness was also pertinent to this visit.    Sepsis secondary to UTI-continue Rocephin IV, saline 100 cc/H await urine and blood culture prior to narrowing.  Diabetes mellitus type 2 without neuropathy-controlled sugars 146-153, Home meds include metformin 500 iii/day-held as well as Prandin 2 mg iii/day-continue sliding scale coverage.  Chronic pain secondary to CVA-continue Dilaudid 2-3 every 6-continue aspirin 81 daily.  Prior subdural hematoma 2008-wheelchair-bound.  Bipolar continue Remeron 15 at bedtime, Ativan 1-2 iii/day-recent loss of mother December 2018 and was having insomnia so zolpidem was started and we may need to discontinue the same as this might cause a confusion.  Movement disorder continue Sinemet 25-250 iii/day  COPD continue  inhalers as needed      DVT prophylaxis: Lovenox code Status:   Full   family Communication:   None  Disposition Plan: Treating associated conditions not ready for discharge at this time   Consultants:   none  Procedures:   none  Antimicrobials:    rocephin 1/4   Subjective: Awake alert in nad Not confused oriented no cp no fever no sob No dark nor tarry stool  Objective: Vitals:   10/04/17 1729 10/04/17 2043 10/05/17 0046 10/05/17 0525  BP: (!) 130/54 134/79 137/74 (!) 153/76  Pulse: 82 76 70 76  Resp: 18 20 20 18   Temp: 98.4 F (36.9 C) 98.6  F (37 C) 98 F (36.7 C) 97.8 F (36.6 C)  TempSrc: Oral Oral Oral Oral  SpO2: 95% 97%  98%  Weight:      Height:        Intake/Output Summary (Last 24 hours) at 10/05/2017 0737 Last data filed at 10/05/2017 0500 Gross per 24 hour  Intake 4355 ml  Output -  Net 4355 ml   Filed Weights   10/03/17 1706  Weight: 60.8 kg (134 lb)    Examination:  Awake alert flat affect in nad eomi ncat cta b without added sound s1 s 2no m/r/g abd soft-multiple scars, not distedned , no reboudn non tedner Neuro intact moving 4 limbs equally  Data Reviewed: I have personally reviewed following labs and imaging studies  CBC: Recent Labs  Lab 10/03/17 1856 10/04/17 0451  WBC 7.9 8.8  NEUTROABS 5.3  --   HGB 12.2 11.9*  HCT 38.7 38.2  MCV 89.0 89.5  PLT 292 291   Basic Metabolic Panel: Recent Labs  Lab 10/03/17 1856 10/04/17 0451  NA 134*  --   K 4.4  --   CL 97*  --   CO2 27  --   GLUCOSE 129*  --   BUN 13  --   CREATININE 0.70 0.72  CALCIUM 9.4  --    GFR: Estimated Creatinine Clearance: 61.4 mL/min (by C-G formula based on SCr of 0.72 mg/dL). Liver Function Tests: Recent Labs  Lab 10/03/17 1856  AST 19  ALT 14  ALKPHOS 71  BILITOT 0.5  PROT 6.8  ALBUMIN 4.0   No results for input(s): LIPASE,  AMYLASE in the last 168 hours. No results for input(s): AMMONIA in the last 168 hours. Coagulation Profile: No results for input(s): INR, PROTIME in the last 168 hours. Cardiac Enzymes: Recent Labs  Lab 10/04/17 0636 10/04/17 0748  TROPONINI <0.03 <0.03   CBG: Recent Labs  Lab 10/04/17 0037 10/04/17 1204 10/04/17 1753 10/04/17 2139 10/05/17 0652  GLUCAP 170* 157* 148* 153* 146*   Urine analysis:    Component Value Date/Time   COLORURINE AMBER (A) 10/03/2017 1856   APPEARANCEUR CLOUDY (A) 10/03/2017 1856   LABSPEC 1.017 10/03/2017 1856   PHURINE 6.0 10/03/2017 1856   GLUCOSEU NEGATIVE 10/03/2017 1856   GLUCOSEU NEGATIVE 12/24/2012 1626   HGBUR NEGATIVE  10/03/2017 1856   BILIRUBINUR NEGATIVE 10/03/2017 1856   BILIRUBINUR Neg 11/06/2016 1159   KETONESUR 5 (A) 10/03/2017 1856   PROTEINUR NEGATIVE 10/03/2017 1856   UROBILINOGEN negative 11/06/2016 1159   UROBILINOGEN 0.2 12/24/2012 1626   NITRITE POSITIVE (A) 10/03/2017 1856   LEUKOCYTESUR MODERATE (A) 10/03/2017 1856     Radiology Studies: Reviewed images personally in health database    Scheduled Meds: . aspirin EC  81 mg Oral Daily  . B-complex with vitamin C  1 tablet Oral Daily  . calcium carbonate  1,250 mg Oral BID WC  . carbidopa-levodopa  1 tablet Oral TID WC  . cholecalciferol  2,000 Units Oral Daily  . dextromethorphan-guaiFENesin  1 tablet Oral BID  . enoxaparin (LOVENOX) injection  40 mg Subcutaneous Daily  . insulin aspart  0-9 Units Subcutaneous TID WC  . LORazepam  2 mg Oral TID  . metoprolol tartrate  12.5 mg Oral BID  . mirtazapine  15 mg Oral QHS  . repaglinide  2 mg Oral TID AC  . umeclidinium-vilanterol  1 puff Inhalation Daily   Continuous Infusions: . cefTRIAXone (ROCEPHIN)  IV 1 g (10/05/17 0644)     LOS: 1 day    Time spent: 4925    Pleas KochJai Vera Wishart, MD [facility](P) [phone]   If 7PM-7AM, please contact night-coverage [email or web address] Password [facility] 10/05/2017, 7:37 AM

## 2017-10-05 NOTE — Evaluation (Signed)
Physical Therapy Evaluation Patient Details Name: [name]: [id number] DOB: Oct 29, 1950 Today's Date: 10/05/2017   History of Present Illness  Pt is 67 year old female with PMH consisting of diabetes mellitus type 2, HTN, chronic pain, CVA with residual left-sided weakness, and bilateral subdural hematoma 2008 without evac. She was admitted from home with some confusion and choking episode without LOC.  Urinalysis concerning for UTI.  Patient is hypotensive and septic.     Clinical Impression  Pt admitted with above diagnosis. Pt currently with functional limitations due to the deficits listed below (see PT Problem List). On eval, pt required mod assist bed mobility and mod assist sit to stand with RW. Unable to progress beyond static stance bedside due to BLE pain and weakness. PTA, pt able to ambulate short distance with RW.  Pt will benefit from skilled PT to increase their independence and safety with mobility to allow discharge to the venue listed below.  She has all needed home DME, including wheelchair. She also has 24-hour assist between her husband and a CNA.     Follow Up Recommendations Home health PT;Supervision/Assistance - 24 hour    Equipment Recommendations  None recommended by PT    Recommendations for Other Services       Precautions / Restrictions Precautions Precautions: Fall      Mobility  Bed Mobility Overal bed mobility: Needs Assistance Bed Mobility: Supine to Sit;Sit to Supine     Supine to sit: Mod assist;HOB elevated Sit to supine: Mod assist;HOB elevated   General bed mobility comments: +rail, verbal cues for sequencing  Transfers Overall transfer level: Needs assistance Equipment used: Rolling walker (2 wheeled) Transfers: Sit to/from Stand Sit to Stand: Mod assist         General transfer comment: verbal cues for hand placement, assist to power up  Ambulation/Gait             General Gait Details: unable due to pain and  weakness  Stairs            Wheelchair Mobility    Modified Rankin (Stroke Patients Only)       Balance Overall balance assessment: Needs assistance Sitting-balance support: No upper extremity supported;Feet supported Sitting balance-Leahy Scale: Good     Standing balance support: Bilateral upper extremity supported;During functional activity Standing balance-Leahy Scale: Poor Standing balance comment: heavy reliance on RW                             Pertinent Vitals/Pain Pain Assessment: Faces Faces Pain Scale: Hurts a little bit Pain Location: BLE with stance Pain Descriptors / Indicators: Spasm;Grimacing;Aching Pain Intervention(s): Limited activity within patient's tolerance;Monitored during session    Home Living Family/patient expects to be discharged to:: Private residence Living Arrangements: Spouse/significant other Available Help at Discharge: Family;Personal care attendant Type of Home: House Home Access: Ramped entrance     Home Layout: One level Home Equipment: Environmental consultant - 2 wheels;Walker - 4 wheels;Bedside commode;Wheelchair - Lawyer Comments: CNA 9:30-5 M-F    Prior Function Level of Independence: Needs assistance   Gait / Transfers Assistance Needed: Ambulates short distances with RW. Wheelchair used outside of house and in house when fatigued.   ADL's / Homemaking Assistance Needed: set-up, SBA from CNA for all ADLs  Comments: Husband reports multi falls.      Hand Dominance   Dominant Hand: Right    Extremity/Trunk Assessment   Upper Extremity Assessment Upper  Extremity Assessment: Defer to OT evaluation    Lower Extremity Assessment Lower Extremity Assessment: Generalized weakness    Cervical / Trunk Assessment Cervical / Trunk Assessment: Kyphotic  Communication   Communication: No difficulties  Cognition Arousal/Alertness: Awake/alert Behavior During Therapy: WFL for tasks  assessed/performed Overall Cognitive Status: Impaired/Different from baseline Area of Impairment: Memory;Attention;Problem solving;Safety/judgement                   Current Attention Level: Sustained Memory: Decreased short-term memory   Safety/Judgement: Decreased awareness of safety   Problem Solving: Slow processing;Difficulty sequencing;Requires verbal cues        General Comments      Exercises     Assessment/Plan    PT Assessment Patient needs continued PT services  PT Problem List Decreased strength;Decreased mobility;Decreased safety awareness;Decreased activity tolerance;Decreased balance;Pain       PT Treatment Interventions Therapeutic activities;Cognitive remediation;Gait training;Therapeutic exercise;Patient/family education;Balance training;Functional mobility training    PT Goals (Current goals can be found in the Care Plan section)  Acute Rehab PT Goals Patient Stated Goal: home PT Goal Formulation: With patient/family Time For Goal Achievement: 10/19/17 Potential to Achieve Goals: Fair    Frequency Min 3X/week   Barriers to discharge        Co-evaluation               AM-PAC PT "6 Clicks" Daily Activity  Outcome Measure Difficulty turning over in bed (including adjusting bedclothes, sheets and blankets)?: Unable Difficulty moving from lying on back to sitting on the side of the bed? : Unable Difficulty sitting down on and standing up from a chair with arms (e.g., wheelchair, bedside commode, etc,.)?: Unable Help needed moving to and from a bed to chair (including a wheelchair)?: A Lot Help needed walking in hospital room?: Total Help needed climbing 3-5 steps with a railing? : Total 6 Click Score: 7    End of Session Equipment Utilized During Treatment: Gait belt Activity Tolerance: Patient tolerated treatment well Patient left: in bed;with call bell/phone within reach;with bed alarm set;with family/visitor present Nurse  Communication: Mobility status PT Visit Diagnosis: Muscle weakness (generalized) (M62.81);Difficulty in walking, not elsewhere classified (R26.2)    Time: [id number] PT Time Calculation (min) (ACUTE ONLY): 21 min   Charges:   PT Evaluation $PT Eval Moderate Complexity: 1 Mod     PT G Codes:        Aida RaiderWendy Nami Strawder, PT  Office # [phone] Pager [phone]   Ilda FoilGarrow, Arling Cerone Rene 10/05/2017, 10:52 AM

## 2017-10-06 LAB — GLUCOSE, CAPILLARY
Glucose-Capillary: 142 mg/dL — ABNORMAL HIGH (ref 65–99)
Glucose-Capillary: 121 mg/dL — ABNORMAL HIGH (ref 65–99)

## 2017-10-06 LAB — COMPREHENSIVE METABOLIC PANEL
ALT: 10 U/L — ABNORMAL LOW (ref 14–54)
AST: 21 U/L (ref 15–41)
Albumin: 2.9 g/dL — ABNORMAL LOW (ref 3.5–5.0)
Alkaline Phosphatase: 58 U/L (ref 38–126)
Anion gap: 4 — ABNORMAL LOW (ref 5–15)
BUN: 7 mg/dL (ref 6–20)
CO2: 22 mmol/L (ref 22–32)
Calcium: 8.3 mg/dL — ABNORMAL LOW (ref 8.9–10.3)
Chloride: 112 mmol/L — ABNORMAL HIGH (ref 101–111)
Creatinine, Ser: 0.64 mg/dL (ref 0.44–1.00)
GFR calc Af Amer: >60 mL/min (ref >60)
GFR calc non Af Amer: >60 mL/min (ref >60)
Glucose, Bld: 143 mg/dL — ABNORMAL HIGH (ref 65–99)
Potassium: 4.1 mmol/L (ref 3.5–5.1)
Sodium: 138 mmol/L (ref 135–145)
Total Bilirubin: 0.7 mg/dL (ref 0.3–1.2)
Total Protein: 5.5 g/dL — ABNORMAL LOW (ref 6.5–8.1)

## 2017-10-06 LAB — CBC WITH DIFFERENTIAL/PLATELET
Basophils Absolute: 0.1 10*3/uL (ref 0.0–0.1)
Basophils Relative: 1 %
Eosinophils Absolute: 0.2 10*3/uL (ref 0.0–0.7)
Eosinophils Relative: 4 %
HCT: 34.8 % — ABNORMAL LOW (ref 36.0–46.0)
Hemoglobin: 10.8 g/dL — ABNORMAL LOW (ref 12.0–15.0)
Lymphocytes Relative: 33 %
Lymphs Abs: 1.9 10*3/uL (ref 0.7–4.0)
MCH: 28 pg (ref 26.0–34.0)
MCHC: 31 g/dL (ref 30.0–36.0)
MCV: 90.2 fL (ref 78.0–100.0)
Monocytes Absolute: 0.5 10*3/uL (ref 0.1–1.0)
Monocytes Relative: 9 %
Neutro Abs: 3 10*3/uL (ref 1.7–7.7)
Neutrophils Relative %: 53 %
Platelets: 213 10*3/uL (ref 150–400)
RBC: 3.86 MIL/uL — ABNORMAL LOW (ref 3.87–5.11)
RDW: 15.6 % — ABNORMAL HIGH (ref 11.5–15.5)
WBC: 5.7 10*3/uL (ref 4.0–10.5)

## 2017-10-06 MED ORDER — NITROFURANTOIN MACROCRYSTAL 100 MG PO CAPS: 100.0000 mg | ORAL_CAPSULE | Freq: Two times a day (BID) | ORAL | 0 refills | Status: DC

## 2017-10-06 MED ORDER — NITROFURANTOIN MONOHYD MACRO 100 MG PO CAPS
100.0000 mg | ORAL_CAPSULE | Freq: Two times a day (BID) | ORAL | Status: DC
  Filled 2017-10-06: qty 1

## 2017-10-06 MED ORDER — NITROFURANTOIN MACROCRYSTAL 100 MG PO CAPS
100.0000 mg | ORAL_CAPSULE | Freq: Two times a day (BID) | ORAL | Status: DC
  Administered 2017-10-06: 100 mg via ORAL
  Filled 2017-10-06: qty 1

## 2017-10-06 NOTE — Progress Notes (Signed)
Discharge instructions reviewed with patient/family. All questions answered at this time. Transport home by family.   Arrow Emmerich, RN 

## 2017-10-06 NOTE — Discharge Summary (Signed)
Physician Discharge Summary  [name] ZOX:[id number] DOB: 1951/06/08 DOA: 10/03/2017  PCP: Tresa Garter, MD  Admit date: 10/03/2017 Discharge date: 10/06/2017  Time spent: 25 minutes  Recommendations for Outpatient Follow-up:  1. Complete Abx 1/8 for urinary infection 2. 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, bilateral subdural hematoma 2008 without evac admitted from home with some confusion choking episode without LOC Urinalysis concerning for UTI patient hypotensive and septic    Hospital Course:  Sepsis secondary to UTI from Ecoli-initially was on Rocephin IV, narrowed to oral macobid 100 bid to complete 2 more days on d/c  Diabetes mellitus type 2 without neuropathy-controlled sugars 146-153, Home meds include metformin 500 iii/day-held as well as Prandin 2 mg iii/day which were conitnued on d/c-sugar well controlled in hospital  Chronic pain secondary to CVA-continue Dilaudid 2-3 every 6-continue aspirin 81 daily.  Prior subdural hematoma 2008-wheelchair-bound.  Bipolar continue Remeron 15 at bedtime, Ativan 1-2 iii/day-recent loss of mother December 2018 and was having insomnia so zolpidem was started--careful eval as OP regarding meds as potenttial for confusion  Movement disorder continue Sinemet 25-250 iii/day  COPD continue  inhalers as needed    Procedures:  none (i.e. Studies not automatically included, echos, thoracentesis, etc; not  x-rays)  Consultations:   none  Discharge Exam: Vitals:   10/06/17 0602 10/06/17 0938  BP: (!) 157/79 (!) 151/73  Pulse: 69 92  Resp: 16 18  Temp: 98.3 F (36.8 C) 98 F (36.7 C)  SpO2: 95% 93%    General: awake alert sometimes confused but improved overall Cardiovascular: s1 s2 no m/r/g Respiratory: clear no addedsound abd soft nt no le swelling Can name place time but diffculty with year  Discharge Instructions   Discharge Instructions    Diet - low sodium heart healthy   Complete by:  As directed    Discharge instructions   Complete by:  As directed    Complete Macrobid for infection-2 more days supply Stop taking cough syrup with codeine Follow with pain Md for refills and regular Md for follow up 1 week   Increase activity slowly   Complete by:  As directed      Allergies as of 10/06/2017      Reactions   Asa [aspirin] Other (See Comments)   "stomach burning", "I can take baby aspirin"   Prednisone Other (See Comments)   MD told it gavepancreatitis  and almost died      Medication List    STOP taking these medications   promethazine-codeine 6.25-10 MG/5ML syrup Commonly known as:  PHENERGAN with CODEINE     TAKE these medications   albuterol 108 (90 Base) MCG/ACT inhaler Commonly known as:  PROVENTIL HFA;VENTOLIN HFA Inhale 1-2 puffs into the lungs every 6 (six) hours as needed for wheezing or shortness of breath.   aspirin 81 MG EC tablet Take 81 mg by mouth daily.   b complex vitamins tablet Take 1 tablet by mouth daily.   benzonatate  100 MG capsule Commonly known as:  TESSALON Take 1 capsule (100 mg total) by mouth 2 (two) times daily as needed for cough.   calcium carbonate 1500 (600 Ca) MG Tabs tablet Commonly known as:  OSCAL Take 1 tablet (1,500 mg total) by mouth 2 (two) times daily with a meal.   carbidopa-levodopa 25-250 MG tablet Commonly known as:  SINEMET IR Take 1 tablet by mouth 3 (three) times daily.   cholecalciferol  1000 units tablet Commonly known as:  VITAMIN D Take 2 tablets (2,000 Units total) by mouth daily.   glucose blood test strip Commonly known as:  ONETOUCH VERIO Use to check blood sugars twice a day. Dx E11.9   HYDROmorphone 2 MG tablet Commonly known as:  DILAUDID Take 2-3 tablets (4-6 mg total) by mouth every 6 (six) hours as needed for moderate pain or severe pain. What changed:    how much to take  when to take this   LORazepam 2 MG tablet Commonly known as:  ATIVAN TAKE ONE-HALF TO ONE TABLET THREE TIMES A DAY What changed:  See the new instructions.   metFORMIN 500 MG 24 hr tablet Commonly known as:  GLUCOPHAGE-XR Take 1 tablet (500 mg total) by mouth 3 (three) times daily.   metoprolol tartrate 25 MG tablet Commonly known as:  LOPRESSOR Take 0.5 tablets (12.5 mg total) 2 (two) times daily by mouth.   mirtazapine 15 MG tablet Commonly known as:  REMERON TAKE 1 TABLET BY MOUTH AT BEDTIME AT 6-8 PM   nitrofurantoin 100 MG capsule Commonly known as:  MACRODANTIN Take 1 capsule (100 mg total) by mouth every 12 (twelve) hours.   ONETOUCH DELICA LANCETS 33G Misc Use to help check blood sugars twice a day. Dx E11.9   promethazine 12.5 MG tablet Commonly known as:  PHENERGAN Take 1 tablet (12.5 mg total) by mouth every 6 (six) hours as needed. for nausea   repaglinide 2 MG tablet Commonly known as:  PRANDIN Take 1 tablet (2 mg total) by mouth 3 (three) times daily before meals.   umeclidinium-vilanterol 62.5-25 MCG/INH Aepb Commonly known as:  ANORO ELLIPTA Inhale 1 puff into the lungs daily.   Vitamin D (Ergocalciferol) 50000 units Caps capsule Commonly known as:  DRISDOL TAKE 1 CAPSULE (50,000 UNITS TOTAL) BY MOUTH ONCE A WEEK.   zolpidem 10 MG tablet Commonly known as:  AMBIEN TAKE 1 TABLET BY MOUTH EVERY DAY AT BEDTIME AS NEEDED FOR SLEEP      Allergies  Allergen Reactions  . Asa [Aspirin] Other (See Comments)    "stomach burning", "I can take baby  aspirin"  . Prednisone Other (See Comments)    MD told it gavepancreatitis  and almost died      The results of significant diagnostics from this hospitalization (including imaging, microbiology, ancillary and laboratory) are listed below for reference.    Significant Diagnostic Studies: Dg Chest 2 View  Result Date: 10/03/2017 CLINICAL DATA:  Cough and confusion earlier today. Right upper chest pain. Sudden onset of aphasia. Blurry vision. Left-sided facial droop and weakness is chronic. EXAM: CHEST  2 VIEW COMPARISON:  11/08/2016 FINDINGS: Shallow inspiration. Heart size and pulmonary vascularity are normal. Calcification of the aorta. No airspace disease or consolidation in the lungs. Postoperative changes in the right humerus and left upper quadrant. Degenerative changes in the spine. IMPRESSION: No evidence of active pulmonary disease. Shallow inspiration. Aortic atherosclerosis. Electronically Signed   By: Burman NievesWilliam  Stevens M.D.   On: 10/03/2017 19:37  Ct Head Wo Contrast  Result Date: 10/03/2017 CLINICAL DATA:  Episode of aphasia with contraction of arms around 4 p.m. today. History of seizures. EXAM: CT HEAD WITHOUT CONTRAST TECHNIQUE: Contiguous axial images were obtained from the base of the skull through the vertex without intravenous contrast. COMPARISON:  Brain MRI 03/30/2011 FINDINGS: Brain: No evidence of acute infarction, hemorrhage, hydrocephalus, extra-axial collection or mass lesion/mass effect. White matter volume loss with generalized thinning of the corpus callosum that is advanced. There is associated low-density in the bilateral cerebral white matter. Patient has history of anoxic brain injury. Vascular: Atherosclerotic calcification.  No hyperdense vessel. Skull: No acute or aggressive finding. Sinuses/Orbits: Chronic patchy appearing opacification of paranasal sinuses. The hypoplastic right sphenoid sinus is completely opacified. IMPRESSION: 1. No acute finding. 2. Chronic  generalized white matter injury and volume loss. Electronically Signed   By: Marnee Spring M.D.   On: 10/03/2017 19:32   Mr Laqueta Jean JX Contrast  Result Date: 10/04/2017 CLINICAL DATA:  Initial evaluation for acute altered mental status. EXAM: MRI HEAD WITHOUT AND WITH CONTRAST TECHNIQUE: Multiplanar, multiecho pulse sequences of the brain and surrounding structures were obtained without and with intravenous contrast. CONTRAST:  12mL MULTIHANCE GADOBENATE DIMEGLUMINE 529 MG/ML IV SOLN COMPARISON:  Prior CT from 10/03/2017. FINDINGS: Brain: Study mildly degraded by motion artifact. Diffuse prominence of the CSF containing spaces compatible with generalized cerebral atrophy. Patchy and confluent T2/FLAIR hyperintensity within the periventricular and deep white matter both cerebral hemispheres, most consistent with chronic small vessel ischemic disease, mild to moderate nature. Superimposed remote lacunar infarcts present within the bilateral periventricular white matter. No abnormal foci of restricted diffusion to suggest acute or subacute ischemia. Gray-white matter differentiation maintained. No areas of remote cortical infarction. No evidence for acute or chronic intracranial hemorrhage. No mass lesion, midline shift or mass effect. Mild ventricular prominence related global parenchymal volume loss of hydrocephalus. No extra-axial fluid collection. No abnormal enhancement. Major dural sinuses are grossly patent. Pituitary gland suprasellar region normal. Midline structures intact and normal. Vascular: Major intracranial vascular flow voids maintained at the skullbase. Skull and upper cervical spine: Craniocervical junction normal. Upper cervical spine normal. Bone marrow signal intensity within normal limits. No scalp soft tissue abnormality. Sinuses/Orbits: Globes and orbital soft tissues within normal limits. Scattered mucosal thickening within the ethmoidal air cells and maxillary sinuses. Small bilateral  mastoid effusions, of doubtful significance. Inner ear structures normal. Other: None. IMPRESSION: 1. No acute intracranial abnormality. 2. Generalized age-related cerebral atrophy with mild to moderate chronic small vessel ischemic disease. Electronically Signed   By: Rise Mu M.D.   On: 10/04/2017 06:24    Microbiology: Recent Results (from the past 240 hour(s))  Urine culture     Status: Abnormal (Preliminary result)   Collection Time: 10/03/17  9:23 PM  Result Value Ref Range Status   Specimen Description URINE, CLEAN CATCH  Final   Special Requests NONE  Final   Culture (A)  Final    70,000 COLONIES/mL ESCHERICHIA COLI 30,000 COLONIES/mL ENTEROCOCCUS FAECALIS SUSCEPTIBILITIES TO FOLLOW    Report Status PENDING  Incomplete   Organism ID, Bacteria ESCHERICHIA COLI (A)  Final      Susceptibility   Escherichia coli - MIC*    AMPICILLIN <=2 SENSITIVE Sensitive     CEFAZOLIN <=4 SENSITIVE Sensitive     CEFTRIAXONE <=1 SENSITIVE Sensitive     CIPROFLOXACIN <=0.25 SENSITIVE Sensitive     GENTAMICIN <=1 SENSITIVE Sensitive     IMIPENEM <=0.25 SENSITIVE Sensitive  NITROFURANTOIN <=16 SENSITIVE Sensitive     TRIMETH/SULFA <=20 SENSITIVE Sensitive     AMPICILLIN/SULBACTAM <=2 SENSITIVE Sensitive     PIP/TAZO <=4 SENSITIVE Sensitive     Extended ESBL NEGATIVE Sensitive     * 70,000 COLONIES/mL ESCHERICHIA COLI  Culture, blood (routine x 2)     Status: None (Preliminary result)   Collection Time: 10/04/17  6:30 AM  Result Value Ref Range Status   Specimen Description BLOOD RIGHT ARM  Final   Special Requests   Final    BOTTLES DRAWN AEROBIC AND ANAEROBIC Blood Culture adequate volume   Culture NO GROWTH 2 DAYS  Final   Report Status PENDING  Incomplete  Culture, blood (routine x 2)     Status: None (Preliminary result)   Collection Time: 10/04/17  6:40 AM  Result Value Ref Range Status   Specimen Description BLOOD RIGHT HAND  Final   Special Requests IN PEDIATRIC  BOTTLE Blood Culture adequate volume  Final   Culture NO GROWTH 2 DAYS  Final   Report Status PENDING  Incomplete     Labs: Basic Metabolic Panel: Recent Labs  Lab 10/03/17 1856 10/04/17 0451 10/06/17 0301  NA 134*  --  138  K 4.4  --  4.1  CL 97*  --  112*  CO2 27  --  22  GLUCOSE 129*  --  143*  BUN 13  --  7  CREATININE 0.70 0.72 0.64  CALCIUM 9.4  --  8.3*   Liver Function Tests: Recent Labs  Lab 10/03/17 1856 10/06/17 0301  AST 19 21  ALT 14 10*  ALKPHOS 71 58  BILITOT 0.5 0.7  PROT 6.8 5.5*  ALBUMIN 4.0 2.9*   No results for input(s): LIPASE, AMYLASE in the last 168 hours. No results for input(s): AMMONIA in the last 168 hours. CBC: Recent Labs  Lab 10/03/17 1856 10/04/17 0451 10/06/17 0301  WBC 7.9 8.8 5.7  NEUTROABS 5.3  --  3.0  HGB 12.2 11.9* 10.8*  HCT 38.7 38.2 34.8*  MCV 89.0 89.5 90.2  PLT 292 291 213   Cardiac Enzymes: Recent Labs  Lab 10/04/17 0636 10/04/17 0748  TROPONINI <0.03 <0.03   BNP: BNP (last 3 results) No results for input(s): BNP in the last 8760 hours.  ProBNP (last 3 results) No results for input(s): PROBNP in the last 8760 hours.  CBG: Recent Labs  Lab 10/05/17 0652 10/05/17 1142 10/05/17 2206 10/06/17 0630 10/06/17 1125  GLUCAP 146* 157* 132* 142* 121*       Signed:  Rhetta Mura MD   [facility] 10/06/2017, 12:17 PM

## 2017-10-07 ENCOUNTER — Telehealth: Payer: Self-pay | Admitting: *Deleted

## 2017-10-07 LAB — URINE CULTURE: Culture: 70000 — AB

## 2017-10-07 NOTE — Consult Note (Signed)
            [facility] Primary Care Navigator  10/07/2017  [name] 08/18/1951 [id number]   Attempt to seepatient at the bedsideto identify possible discharge needs butshe was already discharged per staff report.   Patient was dischargedhomeover the weekend. Per chart review, shewasadmittedfrom home with some confusion difficulty speaking and choking episode.  Primary care provider's office is listed as providing transition of care (TOC). Noted that transition of care follow-up call was done by primary care provider's office today by L. Brand, RMA.   For additional questions please contact:  [name], BSN, RN-BC [facility] PRIMARY CARE Navigator Cell: [phone]

## 2017-10-07 NOTE — Telephone Encounter (Signed)
Transition Care Management Follow-up Telephone Call  Date discharged? 10/06/17   How have you been since you were released from the hospital? Pt states she is doing fine   Do you understand why you were in the hospital? YES   Do you understand the discharge instructions? YES   Where were you discharged to? HOME   Items Reviewed:  Medications reviewed: YES  Allergies reviewed: YES  Dietary changes reviewed: NO  Referrals reviewed: YES, Will call for appt w/pain doctor   Functional Questionnaire:   Activities of Daily Living (ADLs):   She states she are independent in the following: bathing and hygiene, feeding, continence, grooming, toileting and dressing States she  require assistance with the following: ambulation   Any transportation issues/concerns?: YES   Any patient concerns? NO   Confirmed importance and date/time of follow-up visits scheduled YES, 10/14/17  Provider Appointment booked with Dr. Posey ReaPlotnikov  Confirmed with patient if condition begins to worsen call PCP or go to the ER.  Patient was given the office number and encouraged to call back with question or concerns.  : YES

## 2017-10-09 LAB — CULTURE, BLOOD (ROUTINE X 2)
Culture: NO GROWTH
Culture: NO GROWTH
Special Requests: ADEQUATE
Special Requests: ADEQUATE

## 2017-10-14 ENCOUNTER — Ambulatory Visit (INDEPENDENT_AMBULATORY_CARE_PROVIDER_SITE_OTHER): Payer: Medicare Other | Admitting: Internal Medicine

## 2017-10-14 ENCOUNTER — Encounter: Payer: Self-pay | Admitting: Internal Medicine

## 2017-10-14 ENCOUNTER — Telehealth: Payer: Self-pay | Admitting: Internal Medicine

## 2017-10-14 DIAGNOSIS — E538 Deficiency of other specified B group vitamins: Secondary | ICD-10-CM | POA: Diagnosis not present

## 2017-10-14 DIAGNOSIS — E1100 Type 2 diabetes mellitus with hyperosmolarity without nonketotic hyperglycemic-hyperosmolar coma (NKHHC): Secondary | ICD-10-CM

## 2017-10-14 DIAGNOSIS — F4321 Adjustment disorder with depressed mood: Secondary | ICD-10-CM

## 2017-10-14 NOTE — Assessment & Plan Note (Signed)
Prandin 

## 2017-10-14 NOTE — Telephone Encounter (Unsigned)
Copied from CRM [phone]#[id number]. Topic: General - Other >> Oct 14, 2017 10:47 AM Raquel SarnaHayes, Teresa G wrote: Prolia 60 mg  [facility] in [place] -  [phone] - called to request pt's refill.

## 2017-10-14 NOTE — Assessment & Plan Note (Signed)
On B12 

## 2017-10-14 NOTE — Telephone Encounter (Signed)
[facility]also faxed yellow medication reorder form to them----prolia syringe will be shipped to [facility] and I will notify patient when received so that appt can be made to get injection at [facility]--insurance has been verified and patient has estimated $0 copay for prolia---

## 2017-10-14 NOTE — Telephone Encounter (Signed)
Please advise 

## 2017-10-14 NOTE — Assessment & Plan Note (Signed)
1/18 mom died at 4298

## 2017-10-14 NOTE — Telephone Encounter (Signed)
Request to refill Prolia.

## 2017-10-14 NOTE — Progress Notes (Signed)
Subjective:  Patient ID: [name], female    DOB: March 20, 1951  Age: 67 y.o. MRN: [id number]  CC: No chief complaint on file.   HPI [name] presents for a UTI w/sepsis treated w/Rocephin IV 1/3-10/06/17   She was d/c'd on oral macobid 100 bid to complete   C/o cough - on Anoro now  F/u anxiety, pain, DM     Outpatient Medications Prior to Visit  Medication Sig Dispense Refill  . albuterol (PROVENTIL HFA;VENTOLIN HFA) 108 (90 Base) MCG/ACT inhaler Inhale 1-2 puffs into the lungs every 6 (six) hours as needed for wheezing or shortness of breath. 1 Inhaler 0  . aspirin 81 MG EC tablet Take 81 mg by mouth daily.      Marland Kitchen b complex vitamins tablet Take 1 tablet by mouth daily. 100 tablet 3  . benzonatate (TESSALON) 100 MG capsule Take 1 capsule (100 mg total) by mouth 2 (two) times daily as needed for cough. 60 capsule 0  . calcium carbonate (OSCAL) 1500 (600 Ca) MG TABS tablet Take 1 tablet (1,500 mg total) by mouth 2 (two) times daily with a meal. 60 tablet 4  . carbidopa-levodopa (SINEMET IR) 25-250 MG tablet Take 1 tablet by mouth 3 (three) times daily. 270 tablet 3  . cholecalciferol (VITAMIN D) 1000 UNITS tablet Take 2 tablets (2,000 Units total) by mouth daily. 100 tablet 3  . glucose blood (ONETOUCH VERIO) test strip Use to check blood sugars twice a day. Dx E11.9 100 each 5  . HYDROmorphone (DILAUDID) 2 MG tablet Take 2-3 tablets (4-6 mg total) by mouth every 6 (six) hours as needed for moderate pain or severe pain. (Patient taking differently: Take 2 mg by mouth every 8 (eight) hours as needed for moderate pain or severe pain. ) 75 tablet 0  . LORazepam (ATIVAN) 2 MG tablet TAKE ONE-HALF TO ONE TABLET THREE TIMES A DAY (Patient taking differently: TAKE ONE TABLET THREE TIMES A DAY) 90 tablet 2  . metFORMIN (GLUCOPHAGE-XR) 500 MG 24 hr tablet Take 1 tablet (500 mg total) by mouth 3 (three) times daily. 270 tablet 1  . metoprolol tartrate (LOPRESSOR) 25 MG tablet Take 0.5  tablets (12.5 mg total) 2 (two) times daily by mouth. 90 tablet 3  . mirtazapine (REMERON) 15 MG tablet TAKE 1 TABLET BY MOUTH AT BEDTIME AT 6-8 PM 90 tablet 3  . nitrofurantoin (MACRODANTIN) 100 MG capsule Take 1 capsule (100 mg total) by mouth every 12 (twelve) hours. 4 capsule 0  . ONETOUCH DELICA LANCETS 33G MISC Use to help check blood sugars twice a day. Dx E11.9 100 each 3  . promethazine (PHENERGAN) 12.5 MG tablet Take 1 tablet (12.5 mg total) by mouth every 6 (six) hours as needed. for nausea 60 tablet 0  . repaglinide (PRANDIN) 2 MG tablet Take 1 tablet (2 mg total) by mouth 3 (three) times daily before meals. 90 tablet 11  . umeclidinium-vilanterol (ANORO ELLIPTA) 62.5-25 MCG/INH AEPB Inhale 1 puff into the lungs daily. 1 each 3  . Vitamin D, Ergocalciferol, (DRISDOL) 50000 units CAPS capsule TAKE 1 CAPSULE (50,000 UNITS TOTAL) BY MOUTH ONCE A WEEK. 6 capsule 0  . zolpidem (AMBIEN) 10 MG tablet TAKE 1 TABLET BY MOUTH EVERY DAY AT BEDTIME AS NEEDED FOR SLEEP 30 tablet 2   No facility-administered medications prior to visit.     ROS Review of Systems  Constitutional: Positive for fatigue. Negative for activity change, appetite change, chills and unexpected weight change.  HENT: Negative for congestion, mouth sores and sinus pressure.   Eyes: Negative for visual disturbance.  Respiratory: Positive for cough. Negative for chest tightness.   Gastrointestinal: Negative for abdominal pain and nausea.  Genitourinary: Negative for difficulty urinating, frequency and vaginal pain.  Musculoskeletal: Negative for back pain and gait problem.  Skin: Negative for pallor and rash.  Neurological: Negative for dizziness, tremors, weakness, numbness and headaches.  Psychiatric/Behavioral: Negative for confusion and sleep disturbance.    Objective:  BP 126/78 (BP Location: Left Arm, Patient Position: Sitting, Cuff Size: Large)   Pulse 62   Temp 98.5 F (36.9 C) (Oral)   Ht 5\' 5"  (1.651 m)    Wt 158 lb (71.7 kg)   SpO2 96%   BMI 26.29 kg/m   BP Readings from Last 3 Encounters:  10/14/17 126/78  10/06/17 (!) 151/73  09/23/17 130/72    Wt Readings from Last 3 Encounters:  10/14/17 158 lb (71.7 kg)  10/03/17 134 lb (60.8 kg)  09/23/17 152 lb (68.9 kg)    Physical Exam  Constitutional: She appears well-developed. No distress.  HENT:  Head: Normocephalic.  Right Ear: External ear normal.  Left Ear: External ear normal.  Nose: Nose normal.  Mouth/Throat: Oropharynx is clear and moist.  Eyes: Conjunctivae are normal. Pupils are equal, round, and reactive to light. Right eye exhibits no discharge. Left eye exhibits no discharge.  Neck: Normal range of motion. Neck supple. No JVD present. No tracheal deviation present. No thyromegaly present.  Cardiovascular: Normal rate, regular rhythm and normal heart sounds.  Pulmonary/Chest: No stridor. No respiratory distress. She has no wheezes.  Abdominal: Soft. Bowel sounds are normal. She exhibits no distension and no mass. There is no tenderness. There is no rebound and no guarding.  Musculoskeletal: She exhibits no edema or tenderness.  Lymphadenopathy:    She has no cervical adenopathy.  Neurological: She displays normal reflexes. No cranial nerve deficit. She exhibits normal muscle tone. Coordination normal.  Skin: No rash noted. No erythema.  Psychiatric: She has a normal mood and affect. Her behavior is normal. Judgment and thought content normal.   In a w/c  Lab Results  Component Value Date   WBC 5.7 10/06/2017   HGB 10.8 (L) 10/06/2017   HCT 34.8 (L) 10/06/2017   PLT 213 10/06/2017   GLUCOSE 143 (H) 10/06/2017   CHOL 213 (H) 04/16/2017   TRIG 183.0 (H) 04/16/2017   HDL 93.40 04/16/2017   LDLDIRECT 100.8 10/26/2013   LDLCALC 83 04/16/2017   ALT 10 (L) 10/06/2017   AST 21 10/06/2017   NA 138 10/06/2017   K 4.1 10/06/2017   CL 112 (H) 10/06/2017   CREATININE 0.64 10/06/2017   BUN 7 10/06/2017   CO2 22  10/06/2017   TSH 1.43 12/28/2015   INR 1.12 11/08/2016   HGBA1C 7.5 (H) 10/04/2017    Dg Chest 2 View  Result Date: 10/03/2017 CLINICAL DATA:  Cough and confusion earlier today. Right upper chest pain. Sudden onset of aphasia. Blurry vision. Left-sided facial droop and weakness is chronic. EXAM: CHEST  2 VIEW COMPARISON:  11/08/2016 FINDINGS: Shallow inspiration. Heart size and pulmonary vascularity are normal. Calcification of the aorta. No airspace disease or consolidation in the lungs. Postoperative changes in the right humerus and left upper quadrant. Degenerative changes in the spine. IMPRESSION: No evidence of active pulmonary disease. Shallow inspiration. Aortic atherosclerosis. Electronically Signed   By: Burman NievesWilliam  Stevens M.D.   On: 10/03/2017 19:37   Ct Head Wo  Contrast  Result Date: 10/03/2017 CLINICAL DATA:  Episode of aphasia with contraction of arms around 4 p.m. today. History of seizures. EXAM: CT HEAD WITHOUT CONTRAST TECHNIQUE: Contiguous axial images were obtained from the base of the skull through the vertex without intravenous contrast. COMPARISON:  Brain MRI 03/30/2011 FINDINGS: Brain: No evidence of acute infarction, hemorrhage, hydrocephalus, extra-axial collection or mass lesion/mass effect. White matter volume loss with generalized thinning of the corpus callosum that is advanced. There is associated low-density in the bilateral cerebral white matter. Patient has history of anoxic brain injury. Vascular: Atherosclerotic calcification.  No hyperdense vessel. Skull: No acute or aggressive finding. Sinuses/Orbits: Chronic patchy appearing opacification of paranasal sinuses. The hypoplastic right sphenoid sinus is completely opacified. IMPRESSION: 1. No acute finding. 2. 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 Electronically Signed   By: Marnee Spring M.D.   On: 10/03/2017 19:32   Mr Laqueta Jean WG Contrast  Result Date: 10/04/2017 CLINICAL DATA:  Initial evaluation  for acute altered mental status. EXAM: MRI HEAD WITHOUT AND WITH CONTRAST TECHNIQUE: Multiplanar, multiecho pulse sequences of the brain and surrounding structures were obtained without and with intravenous contrast. CONTRAST:  12mL MULTIHANCE GADOBENATE DIMEGLUMINE 529 MG/ML IV SOLN COMPARISON:  Prior CT from 10/03/2017. FINDINGS: Brain: Study mildly degraded by motion artifact. Diffuse prominence of the CSF containing spaces compatible with generalized cerebral atrophy. Patchy and confluent T2/FLAIR hyperintensity within the periventricular and deep white matter both cerebral hemispheres, most consistent with chronic small vessel ischemic disease, mild to moderate nature. Superimposed remote lacunar infarcts present within the bilateral periventricular white matter. No abnormal foci of restricted diffusion to suggest acute or subacute ischemia. Gray-white matter differentiation maintained. No areas of remote cortical infarction. No evidence for acute or chronic intracranial hemorrhage. No mass lesion, midline shift or mass effect. Mild ventricular prominence related global parenchymal volume loss of hydrocephalus. No extra-axial fluid collection. No abnormal enhancement. Major dural sinuses are grossly patent. Pituitary gland suprasellar region normal. Midline structures intact and normal. Vascular: Major intracranial vascular flow voids maintained at the skullbase. Skull and upper cervical spine: Craniocervical junction normal. Upper cervical spine normal. Bone marrow signal intensity within normal limits. No scalp soft tissue abnormality. Sinuses/Orbits: Globes and orbital soft tissues within normal limits. Scattered mucosal thickening within the ethmoidal air cells and maxillary sinuses. Small bilateral mastoid effusions, of doubtful significance. Inner ear structures normal. Other: None. IMPRESSION: 1. No acute intracranial abnormality. 2. Generalized age-related cerebral atrophy with mild to moderate chronic  small vessel ischemic disease. Electronically Signed   By: Rise Mu M.D.   On: 10/04/2017 06:24    Assessment & Plan:   There are no diagnoses linked to this encounter. I am having [name] maintain her aspirin, HYDROmorphone, cholecalciferol, Vitamin D (Ergocalciferol), calcium carbonate, promethazine, umeclidinium-vilanterol, b complex vitamins, ONETOUCH DELICA LANCETS 33G, metFORMIN, carbidopa-levodopa, repaglinide, glucose blood, mirtazapine, zolpidem, metoprolol tartrate, benzonatate, albuterol, LORazepam, and nitrofurantoin.  No orders of the defined types were placed in this encounter.    Follow-up: No Follow-up on file.  [name], MD

## 2017-10-15 NOTE — Telephone Encounter (Signed)
prolia has been recd from specialty pharmacy, labeled with patient's name and placed in refrig---I have left message asking patient to call back to schedule nurse visit to get injection either ON/AFTER jan. 18th, 2019---insurance has been verified and there is estimated $0 copay --so insurance should be covering cost of injection just as they did with last injection---can talk with Maleak Brazzel,RN at [facility] if any further questions

## 2017-10-18 ENCOUNTER — Ambulatory Visit (INDEPENDENT_AMBULATORY_CARE_PROVIDER_SITE_OTHER): Payer: Medicare Other

## 2017-10-18 DIAGNOSIS — M810 Age-related osteoporosis without current pathological fracture: Secondary | ICD-10-CM

## 2017-10-18 MED ORDER — DENOSUMAB 60 MG/ML ~~LOC~~ SOLN
60.0000 mg | Freq: Once | SUBCUTANEOUS | Status: AC
  Administered 2017-10-18: 60 mg via SUBCUTANEOUS

## 2017-10-22 DIAGNOSIS — M25512 Pain in left shoulder: Secondary | ICD-10-CM | POA: Diagnosis not present

## 2017-10-22 DIAGNOSIS — Z79891 Long term (current) use of opiate analgesic: Secondary | ICD-10-CM | POA: Diagnosis not present

## 2017-10-22 DIAGNOSIS — M47816 Spondylosis without myelopathy or radiculopathy, lumbar region: Secondary | ICD-10-CM | POA: Diagnosis not present

## 2017-10-22 DIAGNOSIS — G894 Chronic pain syndrome: Secondary | ICD-10-CM | POA: Diagnosis not present

## 2017-11-01 NOTE — Progress Notes (Signed)
Medical screening examination/treatment/procedure(s) were performed by non-physician practitioner and as supervising physician I was immediately available for consultation/collaboration. I agree with above. Aleksei Plotnikov, MD  

## 2017-11-06 ENCOUNTER — Other Ambulatory Visit: Payer: Self-pay | Admitting: Internal Medicine

## 2017-11-12 ENCOUNTER — Telehealth: Payer: Self-pay | Admitting: Internal Medicine

## 2017-11-12 NOTE — Telephone Encounter (Signed)
Ambien refill Last OV: 10/14/17 with Dr. Posey ReaPlotnikov Last Refill:07/23/17 # with 2 refills [facility] [address], [place]

## 2017-11-12 NOTE — Telephone Encounter (Signed)
Copied from CRM [id number]. Topic: Quick Communication - Rx Refill/Question >> Nov 12, 2017 11:31 AM [name], [name], NT wrote: Medication: Remus Lofflerambien   Has the patient contacted their pharmacy? yes  (Agent: If no, request that the patient contact the pharmacy for the refill.)   Preferred Pharmacy (with phone number or street name): [id number] [name],  - [address] [place] [address] Phone: [phone] Fax: [phone]     Agent: Please be advised that RX refills may take up to 3 business days. We ask that you follow-up with your pharmacy.

## 2017-11-14 NOTE — Telephone Encounter (Signed)
Called pt no answer LMOM MD sent refill to [facility] 

## 2017-11-20 DIAGNOSIS — M47816 Spondylosis without myelopathy or radiculopathy, lumbar region: Secondary | ICD-10-CM | POA: Diagnosis not present

## 2017-11-20 DIAGNOSIS — M25512 Pain in left shoulder: Secondary | ICD-10-CM | POA: Diagnosis not present

## 2017-11-20 DIAGNOSIS — G894 Chronic pain syndrome: Secondary | ICD-10-CM | POA: Diagnosis not present

## 2017-11-20 DIAGNOSIS — Z79891 Long term (current) use of opiate analgesic: Secondary | ICD-10-CM | POA: Diagnosis not present

## 2017-12-03 ENCOUNTER — Other Ambulatory Visit: Payer: Self-pay | Admitting: Internal Medicine

## 2017-12-15 ENCOUNTER — Other Ambulatory Visit: Payer: Self-pay | Admitting: Internal Medicine

## 2017-12-16 ENCOUNTER — Ambulatory Visit (INDEPENDENT_AMBULATORY_CARE_PROVIDER_SITE_OTHER): Payer: Medicare Other | Admitting: Internal Medicine

## 2017-12-16 ENCOUNTER — Encounter: Payer: Self-pay | Admitting: Internal Medicine

## 2017-12-16 ENCOUNTER — Other Ambulatory Visit (INDEPENDENT_AMBULATORY_CARE_PROVIDER_SITE_OTHER): Payer: Medicare Other

## 2017-12-16 ENCOUNTER — Telehealth: Payer: Self-pay | Admitting: Internal Medicine

## 2017-12-16 DIAGNOSIS — G4700 Insomnia, unspecified: Secondary | ICD-10-CM | POA: Diagnosis not present

## 2017-12-16 DIAGNOSIS — E538 Deficiency of other specified B group vitamins: Secondary | ICD-10-CM

## 2017-12-16 DIAGNOSIS — E1100 Type 2 diabetes mellitus with hyperosmolarity without nonketotic hyperglycemic-hyperosmolar coma (NKHHC): Secondary | ICD-10-CM

## 2017-12-16 DIAGNOSIS — E559 Vitamin D deficiency, unspecified: Secondary | ICD-10-CM | POA: Diagnosis not present

## 2017-12-16 DIAGNOSIS — F411 Generalized anxiety disorder: Secondary | ICD-10-CM | POA: Diagnosis not present

## 2017-12-16 DIAGNOSIS — F17200 Nicotine dependence, unspecified, uncomplicated: Secondary | ICD-10-CM | POA: Diagnosis not present

## 2017-12-16 DIAGNOSIS — F4321 Adjustment disorder with depressed mood: Secondary | ICD-10-CM

## 2017-12-16 LAB — BASIC METABOLIC PANEL
BUN: 14 mg/dL (ref 6–23)
CO2: 29 mEq/L (ref 19–32)
Calcium: 9.2 mg/dL (ref 8.4–10.5)
Chloride: 101 mEq/L (ref 96–112)
Creatinine, Ser: 0.71 mg/dL (ref 0.40–1.20)
GFR: 87.19 mL/min (ref >60.00)
Glucose, Bld: 106 mg/dL — ABNORMAL HIGH (ref 70–99)
Potassium: 4.9 mEq/L (ref 3.5–5.1)
Sodium: 138 mEq/L (ref 135–145)

## 2017-12-16 LAB — HEPATIC FUNCTION PANEL
ALT: 10 U/L (ref 0–35)
AST: 14 U/L (ref 0–37)
Albumin: 4.1 g/dL (ref 3.5–5.2)
Alkaline Phosphatase: 76 U/L (ref 39–117)
Bilirubin, Direct: 0.1 mg/dL (ref 0.0–0.3)
Total Bilirubin: 0.4 mg/dL (ref 0.2–1.2)
Total Protein: 6.9 g/dL (ref 6.0–8.3)

## 2017-12-16 LAB — HEMOGLOBIN A1C: Hgb A1c MFr Bld: 7.1 % — ABNORMAL HIGH (ref 4.6–6.5)

## 2017-12-16 MED ORDER — ZOLPIDEM TARTRATE 10 MG PO TABS: 10.0000 mg | ORAL_TABLET | Freq: Every evening | ORAL | 3 refills | Status: DC | PRN

## 2017-12-16 MED ORDER — LORAZEPAM 2 MG PO TABS: ORAL_TABLET | ORAL | 3 refills | Status: DC

## 2017-12-16 NOTE — Assessment & Plan Note (Signed)
On Metformin °Prandin  °

## 2017-12-16 NOTE — Assessment & Plan Note (Signed)
Vit D 

## 2017-12-16 NOTE — Assessment & Plan Note (Addendum)
Lorazepam prn  Potential benefits of a long term benzodiazepines  use as well as potential risks  and complications were explained to the patient and were aknowledged.  Start grief counseling

## 2017-12-16 NOTE — Assessment & Plan Note (Signed)
Quit in 2018 

## 2017-12-16 NOTE — Assessment & Plan Note (Signed)
mom died in Dec 2018

## 2017-12-16 NOTE — Assessment & Plan Note (Signed)
Zolpidem prn  Potential benefits of a long term benzodiazepines  use as well as potential risks  and complications were explained to the patient and were aknowledged. 

## 2017-12-16 NOTE — Assessment & Plan Note (Signed)
On B12 

## 2017-12-16 NOTE — Telephone Encounter (Signed)
While patient was checking out, she asked for a letter of medical necessity so that she can have a ramp built at her home. Can this be done for her?

## 2017-12-16 NOTE — Progress Notes (Signed)
Subjective:  Patient ID: [name], female    DOB: 1951-03-20  Age: 67 y.o. MRN: [id number]  CC: No chief complaint on file.   HPI [name] presents for anxiety, insomnia, DM f/u  Outpatient Medications Prior to Visit  Medication Sig Dispense Refill  . albuterol (PROVENTIL HFA;VENTOLIN HFA) 108 (90 Base) MCG/ACT inhaler Inhale 1-2 puffs into the lungs every 6 (six) hours as needed for wheezing or shortness of breath. 1 Inhaler 0  . aspirin 81 MG EC tablet Take 81 mg by mouth daily.      Marland Kitchen. b complex vitamins tablet Take 1 tablet by mouth daily. 100 tablet 3  . benzonatate (TESSALON) 100 MG capsule Take 1 capsule (100 mg total) by mouth 2 (two) times daily as needed for cough. 60 capsule 0  . calcium carbonate (OSCAL) 1500 (600 Ca) MG TABS tablet Take 1 tablet (1,500 mg total) by mouth 2 (two) times daily with a meal. 60 tablet 4  . carbidopa-levodopa (SINEMET IR) 25-250 MG tablet Take 1 tablet by mouth 3 (three) times daily. 270 tablet 3  . cholecalciferol (VITAMIN D) 1000 UNITS tablet Take 2 tablets (2,000 Units total) by mouth daily. 100 tablet 3  . glucose blood (ONETOUCH VERIO) test strip Use to check blood sugars twice a day. Dx E11.9 100 each 5  . HYDROmorphone (DILAUDID) 2 MG tablet Take 2-3 tablets (4-6 mg total) by mouth every 6 (six) hours as needed for moderate pain or severe pain. (Patient taking differently: Take 2 mg by mouth every 8 (eight) hours as needed for moderate pain or severe pain. ) 75 tablet 0  . LORazepam (ATIVAN) 2 MG tablet TAKE ONE-HALF TO ONE TABLET THREE TIMES A DAY (Patient taking differently: TAKE ONE TABLET THREE TIMES A DAY) 90 tablet 2  . metFORMIN (GLUCOPHAGE-XR) 500 MG 24 hr tablet TAKE 1 TABLET BY MOUTH THREE TIMES A DAY 270 tablet 3  . metoprolol tartrate (LOPRESSOR) 25 MG tablet Take 0.5 tablets (12.5 mg total) 2 (two) times daily by mouth. 90 tablet 3  . mirtazapine (REMERON) 15 MG tablet TAKE 1 TABLET BY MOUTH AT BEDTIME AT 6-8 PM 90 tablet 3   . nitrofurantoin (MACRODANTIN) 100 MG capsule Take 1 capsule (100 mg total) by mouth every 12 (twelve) hours. 4 capsule 0  . ONETOUCH DELICA LANCETS 33G MISC Use to help check blood sugars twice a day. Dx E11.9 100 each 3  . promethazine (PHENERGAN) 12.5 MG tablet Take 1 tablet (12.5 mg total) by mouth every 6 (six) hours as needed. for nausea 60 tablet 0  . repaglinide (PRANDIN) 2 MG tablet Take 1 tablet (2 mg total) by mouth 3 (three) times daily before meals. 90 tablet 11  . umeclidinium-vilanterol (ANORO ELLIPTA) 62.5-25 MCG/INH AEPB Inhale 1 puff into the lungs daily. 1 each 3  . Vitamin D, Ergocalciferol, (DRISDOL) 50000 units CAPS capsule TAKE 1 CAPSULE (50,000 UNITS TOTAL) BY MOUTH ONCE A WEEK. 6 capsule 0  . zolpidem (AMBIEN) 10 MG tablet TAKE 1 TABLET AT BEDTIME AS NEEDED FOR SLEEP 30 tablet 2   No facility-administered medications prior to visit.     ROS Review of Systems  Constitutional: Positive for fatigue. Negative for activity change, appetite change, chills and unexpected weight change.  HENT: Negative for congestion, mouth sores and sinus pressure.   Eyes: Negative for visual disturbance.  Respiratory: Negative for cough and chest tightness.   Gastrointestinal: Negative for abdominal pain and nausea.  Genitourinary: Negative for difficulty  urinating, frequency and vaginal pain.  Musculoskeletal: Positive for arthralgias, back pain, gait problem and neck pain.  Skin: Negative for pallor and rash.  Neurological: Negative for dizziness, tremors, weakness, numbness and headaches.  Psychiatric/Behavioral: Positive for dysphoric mood and sleep disturbance. Negative for confusion and suicidal ideas. The patient is nervous/anxious.     Objective:  BP 124/78 (BP Location: Left Arm, Patient Position: Sitting, Cuff Size: Normal)   Pulse 81   Temp 98.3 F (36.8 C) (Oral)   Ht 5\' 5"  (1.651 m)   Wt 162 lb (73.5 kg)   SpO2 98%   BMI 26.96 kg/m   BP Readings from Last 3  Encounters:  12/16/17 124/78  10/14/17 126/78  10/06/17 (!) 151/73    Wt Readings from Last 3 Encounters:  12/16/17 162 lb (73.5 kg)  10/14/17 158 lb (71.7 kg)  10/03/17 134 lb (60.8 kg)    Physical Exam  Constitutional: She appears well-developed. No distress.  HENT:  Head: Normocephalic.  Right Ear: External ear normal.  Left Ear: External ear normal.  Nose: Nose normal.  Mouth/Throat: Oropharynx is clear and moist.  Eyes: Conjunctivae are normal. Pupils are equal, round, and reactive to light. Right eye exhibits no discharge. Left eye exhibits no discharge.  Neck: Normal range of motion. Neck supple. No JVD present. No tracheal deviation present. No thyromegaly present.  Cardiovascular: Normal rate, regular rhythm and normal heart sounds.  Pulmonary/Chest: No stridor. No respiratory distress. She has no wheezes.  Abdominal: Soft. Bowel sounds are normal. She exhibits no distension and no mass. There is no tenderness. There is no rebound and no guarding.  Musculoskeletal: She exhibits tenderness. She exhibits no edema.  Lymphadenopathy:    She has no cervical adenopathy.  Neurological: She displays normal reflexes. No cranial nerve deficit. She exhibits normal muscle tone. Coordination abnormal.  Skin: No rash noted. No erythema.  Psychiatric: She has a normal mood and affect. Her behavior is normal. Judgment and thought content normal.  in a w/c  Lab Results  Component Value Date   WBC 5.7 10/06/2017   HGB 10.8 (L) 10/06/2017   HCT 34.8 (L) 10/06/2017   PLT 213 10/06/2017   GLUCOSE 143 (H) 10/06/2017   CHOL 213 (H) 04/16/2017   TRIG 183.0 (H) 04/16/2017   HDL 93.40 04/16/2017   LDLDIRECT 100.8 10/26/2013   LDLCALC 83 04/16/2017   ALT 10 (L) 10/06/2017   AST 21 10/06/2017   NA 138 10/06/2017   K 4.1 10/06/2017   CL 112 (H) 10/06/2017   CREATININE 0.64 10/06/2017   BUN 7 10/06/2017   CO2 22 10/06/2017   TSH 1.43 12/28/2015   INR 1.12 11/08/2016   HGBA1C 7.5 (H)  10/04/2017    Dg Chest 2 View  Result Date: 10/03/2017 CLINICAL DATA:  Cough and confusion earlier today. Right upper chest pain. Sudden onset of aphasia. Blurry vision. Left-sided facial droop and weakness is chronic. EXAM: CHEST  2 VIEW COMPARISON:  11/08/2016 FINDINGS: Shallow inspiration. Heart size and pulmonary vascularity are normal. Calcification of the aorta. No airspace disease or consolidation in the lungs. Postoperative changes in the right humerus and left upper quadrant. Degenerative changes in the spine. IMPRESSION: No evidence of active pulmonary disease. Shallow inspiration. Aortic atherosclerosis. Electronically Signed   By: Burman Nieves M.D.   On: 10/03/2017 19:37   Ct Head Wo Contrast  Result Date: 10/03/2017 CLINICAL DATA:  Episode of aphasia with contraction of arms around 4 p.m. today. History of seizures. EXAM: CT  HEAD WITHOUT CONTRAST TECHNIQUE: Contiguous axial images were obtained from the base of the skull through the vertex without intravenous contrast. COMPARISON:  Brain MRI 03/30/2011 FINDINGS: Brain: No evidence of acute infarction, hemorrhage, hydrocephalus, extra-axial collection or mass lesion/mass effect. White matter volume loss with generalized thinning of the corpus callosum that is advanced. There is associated low-density in the bilateral cerebral white matter. Patient has history of anoxic brain injury. Vascular: Atherosclerotic calcification.  No hyperdense vessel. Skull: No acute or aggressive finding. Sinuses/Orbits: Chronic patchy appearing opacification of paranasal sinuses. The hypoplastic right sphenoid sinus is completely opacified. IMPRESSION: 1. No acute finding. 2. 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 Electronically Signed   By: Marnee Spring M.D.   On: 10/03/2017 19:32   Mr Laqueta Jean ZO Contrast  Result Date: 10/04/2017 CLINICAL DATA:  Initial evaluation for acute altered mental status. EXAM: MRI HEAD WITHOUT AND WITH CONTRAST  TECHNIQUE: Multiplanar, multiecho pulse sequences of the brain and surrounding structures were obtained without and with intravenous contrast. CONTRAST:  12mL MULTIHANCE GADOBENATE DIMEGLUMINE 529 MG/ML IV SOLN COMPARISON:  Prior CT from 10/03/2017. FINDINGS: Brain: Study mildly degraded by motion artifact. Diffuse prominence of the CSF containing spaces compatible with generalized cerebral atrophy. Patchy and confluent T2/FLAIR hyperintensity within the periventricular and deep white matter both cerebral hemispheres, most consistent with chronic small vessel ischemic disease, mild to moderate nature. Superimposed remote lacunar infarcts present within the bilateral periventricular white matter. No abnormal foci of restricted diffusion to suggest acute or subacute ischemia. Gray-white matter differentiation maintained. No areas of remote cortical infarction. No evidence for acute or chronic intracranial hemorrhage. No mass lesion, midline shift or mass effect. Mild ventricular prominence related global parenchymal volume loss of hydrocephalus. No extra-axial fluid collection. No abnormal enhancement. Major dural sinuses are grossly patent. Pituitary gland suprasellar region normal. Midline structures intact and normal. Vascular: Major intracranial vascular flow voids maintained at the skullbase. Skull and upper cervical spine: Craniocervical junction normal. Upper cervical spine normal. Bone marrow signal intensity within normal limits. No scalp soft tissue abnormality. Sinuses/Orbits: Globes and orbital soft tissues within normal limits. Scattered mucosal thickening within the ethmoidal air cells and maxillary sinuses. Small bilateral mastoid effusions, of doubtful significance. Inner ear structures normal. Other: None. IMPRESSION: 1. No acute intracranial abnormality. 2. Generalized age-related cerebral atrophy with mild to moderate chronic small vessel ischemic disease. Electronically Signed   By: Rise Mu M.D.   On: 10/04/2017 06:24    Assessment & Plan:   There are no diagnoses linked to this encounter. I am having [name] maintain her aspirin, HYDROmorphone, cholecalciferol, Vitamin D (Ergocalciferol), calcium carbonate, promethazine, umeclidinium-vilanterol, b complex vitamins, ONETOUCH DELICA LANCETS 33G, carbidopa-levodopa, repaglinide, glucose blood, mirtazapine, metoprolol tartrate, benzonatate, albuterol, LORazepam, nitrofurantoin, zolpidem, and metFORMIN.  No orders of the defined types were placed in this encounter.    Follow-up: No Follow-up on file.  [name], MD

## 2017-12-16 NOTE — Patient Instructions (Signed)
Address: [address], [place], [place] [address]     Phone: [phone]

## 2017-12-17 LAB — URINALYSIS, ROUTINE W REFLEX MICROSCOPIC
Bilirubin Urine: NEGATIVE
Hgb urine dipstick: NEGATIVE
Ketones, ur: NEGATIVE
Nitrite: NEGATIVE
Specific Gravity, Urine: 1.015 (ref 1.000–1.030)
Total Protein, Urine: NEGATIVE
Urine Glucose: NEGATIVE
Urobilinogen, UA: 0.2 (ref 0.0–1.0)
pH: 5.5 (ref 5.0–8.0)

## 2017-12-18 ENCOUNTER — Other Ambulatory Visit: Payer: Medicare Other

## 2017-12-18 DIAGNOSIS — M25512 Pain in left shoulder: Secondary | ICD-10-CM | POA: Diagnosis not present

## 2017-12-18 DIAGNOSIS — M47816 Spondylosis without myelopathy or radiculopathy, lumbar region: Secondary | ICD-10-CM | POA: Diagnosis not present

## 2017-12-18 DIAGNOSIS — Z79891 Long term (current) use of opiate analgesic: Secondary | ICD-10-CM | POA: Diagnosis not present

## 2017-12-18 DIAGNOSIS — G894 Chronic pain syndrome: Secondary | ICD-10-CM | POA: Diagnosis not present

## 2017-12-18 DIAGNOSIS — R8291 Other chromoabnormalities of urine: Secondary | ICD-10-CM

## 2017-12-21 LAB — URINE CULTURE
MICRO NUMBER:: 90351320
SPECIMEN QUALITY:: ADEQUATE

## 2017-12-25 ENCOUNTER — Other Ambulatory Visit: Payer: Self-pay | Admitting: Internal Medicine

## 2018-01-02 NOTE — Telephone Encounter (Signed)
Letter written and placed up front, pt notified 

## 2018-01-10 ENCOUNTER — Ambulatory Visit (INDEPENDENT_AMBULATORY_CARE_PROVIDER_SITE_OTHER): Payer: Medicare Other | Admitting: Ophthalmology

## 2018-01-15 ENCOUNTER — Ambulatory Visit (INDEPENDENT_AMBULATORY_CARE_PROVIDER_SITE_OTHER): Payer: Medicare Other | Admitting: Ophthalmology

## 2018-01-15 ENCOUNTER — Encounter (INDEPENDENT_AMBULATORY_CARE_PROVIDER_SITE_OTHER): Payer: Medicare Other | Admitting: Ophthalmology

## 2018-01-15 DIAGNOSIS — H43813 Vitreous degeneration, bilateral: Secondary | ICD-10-CM | POA: Diagnosis not present

## 2018-01-15 DIAGNOSIS — H2513 Age-related nuclear cataract, bilateral: Secondary | ICD-10-CM

## 2018-01-15 DIAGNOSIS — H353132 Nonexudative age-related macular degeneration, bilateral, intermediate dry stage: Secondary | ICD-10-CM | POA: Diagnosis not present

## 2018-01-15 LAB — HM DIABETES EYE EXAM

## 2018-01-16 DIAGNOSIS — M25512 Pain in left shoulder: Secondary | ICD-10-CM | POA: Diagnosis not present

## 2018-01-16 DIAGNOSIS — M47816 Spondylosis without myelopathy or radiculopathy, lumbar region: Secondary | ICD-10-CM | POA: Diagnosis not present

## 2018-01-16 DIAGNOSIS — G894 Chronic pain syndrome: Secondary | ICD-10-CM | POA: Diagnosis not present

## 2018-01-16 DIAGNOSIS — Z79891 Long term (current) use of opiate analgesic: Secondary | ICD-10-CM | POA: Diagnosis not present

## 2018-01-23 ENCOUNTER — Encounter: Payer: Self-pay | Admitting: Internal Medicine

## 2018-02-05 ENCOUNTER — Other Ambulatory Visit: Payer: Self-pay | Admitting: Internal Medicine

## 2018-02-13 DIAGNOSIS — G894 Chronic pain syndrome: Secondary | ICD-10-CM | POA: Diagnosis not present

## 2018-02-13 DIAGNOSIS — M47816 Spondylosis without myelopathy or radiculopathy, lumbar region: Secondary | ICD-10-CM | POA: Diagnosis not present

## 2018-02-13 DIAGNOSIS — Z79891 Long term (current) use of opiate analgesic: Secondary | ICD-10-CM | POA: Diagnosis not present

## 2018-02-13 DIAGNOSIS — M25512 Pain in left shoulder: Secondary | ICD-10-CM | POA: Diagnosis not present

## 2018-02-14 ENCOUNTER — Telehealth: Payer: Self-pay | Admitting: Internal Medicine

## 2018-02-14 NOTE — Telephone Encounter (Signed)
Copied from CRM [id number]. Topic: Quick Communication - See Telephone Encounter >> Feb 14, 2018 11:04 AM Diana Eves B wrote: CRM for notification. See Telephone encounter for: 02/14/18.  Terry pt's caregiver is calling to check on PT orders. They where placed awhile ago and no one has called to set up the appts. She is wanting to know who to contact to check on this. CB# [phone]

## 2018-02-20 NOTE — Telephone Encounter (Signed)
Do you know anything about this? Ive check chart and paperwork.

## 2018-02-20 NOTE — Telephone Encounter (Signed)
Low.  Does she need home PT? Thank you

## 2018-02-21 ENCOUNTER — Other Ambulatory Visit: Payer: Self-pay | Admitting: Internal Medicine

## 2018-02-21 NOTE — Telephone Encounter (Signed)
unable to reach caregiver

## 2018-02-26 ENCOUNTER — Other Ambulatory Visit: Payer: Self-pay | Admitting: Internal Medicine

## 2018-03-13 DIAGNOSIS — Z79891 Long term (current) use of opiate analgesic: Secondary | ICD-10-CM | POA: Diagnosis not present

## 2018-03-13 DIAGNOSIS — G894 Chronic pain syndrome: Secondary | ICD-10-CM | POA: Diagnosis not present

## 2018-03-13 DIAGNOSIS — M47816 Spondylosis without myelopathy or radiculopathy, lumbar region: Secondary | ICD-10-CM | POA: Diagnosis not present

## 2018-03-13 DIAGNOSIS — M25512 Pain in left shoulder: Secondary | ICD-10-CM | POA: Diagnosis not present

## 2018-03-14 ENCOUNTER — Telehealth: Payer: Self-pay | Admitting: Internal Medicine

## 2018-03-14 NOTE — Telephone Encounter (Signed)
Pt called in and would like prolia sent to Pharmacy to pick up

## 2018-03-14 NOTE — Telephone Encounter (Signed)
Called patientregarding her Prolia injection.  Insurance was verified and summary of benefits states that patient should use her pharmacy benefits so that she would own nothing. Patient is due on or after 04/18/18.  Was unable to leave a message.  Okay to schedule but need to know to go ahead and send this to the pharmacy for her to pick up.

## 2018-03-17 ENCOUNTER — Telehealth: Payer: Self-pay

## 2018-03-17 MED ORDER — DENOSUMAB 60 MG/ML ~~LOC~~ SOSY: 60.0000 mg | PREFILLED_SYRINGE | Freq: Once | SUBCUTANEOUS | 1 refills | Status: AC

## 2018-03-17 NOTE — Telephone Encounter (Signed)
error 

## 2018-03-17 NOTE — Telephone Encounter (Signed)
prolia rx sent to [facility]

## 2018-03-18 ENCOUNTER — Ambulatory Visit: Payer: Medicare Other | Admitting: Internal Medicine

## 2018-03-20 ENCOUNTER — Ambulatory Visit (INDEPENDENT_AMBULATORY_CARE_PROVIDER_SITE_OTHER): Payer: Medicare Other | Admitting: Internal Medicine

## 2018-03-20 ENCOUNTER — Encounter: Payer: Self-pay | Admitting: Internal Medicine

## 2018-03-20 ENCOUNTER — Other Ambulatory Visit (INDEPENDENT_AMBULATORY_CARE_PROVIDER_SITE_OTHER): Payer: Medicare Other

## 2018-03-20 VITALS — BP 126/78 | HR 74 | Temp 99.0°F | Ht 65.0 in | Wt 167.0 lb

## 2018-03-20 DIAGNOSIS — R269 Unspecified abnormalities of gait and mobility: Secondary | ICD-10-CM

## 2018-03-20 DIAGNOSIS — E1100 Type 2 diabetes mellitus with hyperosmolarity without nonketotic hyperglycemic-hyperosmolar coma (NKHHC): Secondary | ICD-10-CM | POA: Diagnosis not present

## 2018-03-20 DIAGNOSIS — E559 Vitamin D deficiency, unspecified: Secondary | ICD-10-CM | POA: Diagnosis not present

## 2018-03-20 DIAGNOSIS — E538 Deficiency of other specified B group vitamins: Secondary | ICD-10-CM

## 2018-03-20 DIAGNOSIS — R609 Edema, unspecified: Secondary | ICD-10-CM | POA: Diagnosis not present

## 2018-03-20 DIAGNOSIS — F4321 Adjustment disorder with depressed mood: Secondary | ICD-10-CM | POA: Diagnosis not present

## 2018-03-20 LAB — BASIC METABOLIC PANEL
BUN: 14 mg/dL (ref 6–23)
CO2: 32 mEq/L (ref 19–32)
Calcium: 9.4 mg/dL (ref 8.4–10.5)
Chloride: 97 mEq/L (ref 96–112)
Creatinine, Ser: 0.69 mg/dL (ref 0.40–1.20)
GFR: 90.05 mL/min (ref >60.00)
Glucose, Bld: 208 mg/dL — ABNORMAL HIGH (ref 70–99)
Potassium: 5.3 mEq/L — ABNORMAL HIGH (ref 3.5–5.1)
Sodium: 136 mEq/L (ref 135–145)

## 2018-03-20 LAB — VITAMIN B12: Vitamin B-12: 463 pg/mL (ref 211–911)

## 2018-03-20 LAB — HEMOGLOBIN A1C: Hgb A1c MFr Bld: 7.5 % — ABNORMAL HIGH (ref 4.6–6.5)

## 2018-03-20 LAB — TSH: TSH: 1.92 u[IU]/mL (ref 0.35–4.50)

## 2018-03-20 MED ORDER — BENZONATATE 200 MG PO CAPS: 200.0000 mg | ORAL_CAPSULE | Freq: Three times a day (TID) | ORAL | 0 refills | Status: DC | PRN

## 2018-03-20 NOTE — Assessment & Plan Note (Signed)
Start PT 

## 2018-03-20 NOTE — Assessment & Plan Note (Signed)
Discussed.

## 2018-03-20 NOTE — Assessment & Plan Note (Signed)
Vit D 

## 2018-03-20 NOTE — Progress Notes (Signed)
Subjective:  Patient ID: [name], female    DOB: Feb 11, 1951  Age: 67 y.o. MRN: [id number]  CC: No chief complaint on file.   HPI [name] presents for cough, wt gain, anxiety f/u. C/o wt gain on "Chitos" Has not started PT  Outpatient Medications Prior to Visit  Medication Sig Dispense Refill  . albuterol (PROVENTIL HFA;VENTOLIN HFA) 108 (90 Base) MCG/ACT inhaler Inhale 1-2 puffs into the lungs every 6 (six) hours as needed for wheezing or shortness of breath. 1 Inhaler 0  . aspirin 81 MG EC tablet Take 81 mg by mouth daily.      Marland Kitchen b complex vitamins tablet Take 1 tablet by mouth daily. 100 tablet 3  . benzonatate (TESSALON) 100 MG capsule Take 1 capsule (100 mg total) by mouth 2 (two) times daily as needed for cough. 60 capsule 0  . calcium carbonate (OSCAL) 1500 (600 Ca) MG TABS tablet Take 1 tablet (1,500 mg total) by mouth 2 (two) times daily with a meal. 60 tablet 4  . carbidopa-levodopa (SINEMET IR) 25-250 MG tablet Take 1 tablet by mouth 3 (three) times daily. 270 tablet 3  . cholecalciferol (VITAMIN D) 1000 UNITS tablet Take 2 tablets (2,000 Units total) by mouth daily. 100 tablet 3  . glucose blood (ONETOUCH VERIO) test strip Use to check blood sugars twice a day. Dx E11.9 100 each 5  . HYDROmorphone (DILAUDID) 2 MG tablet Take 2-3 tablets (4-6 mg total) by mouth every 6 (six) hours as needed for moderate pain or severe pain. (Patient taking differently: Take 2 mg by mouth every 8 (eight) hours as needed for moderate pain or severe pain. ) 75 tablet 0  . LORazepam (ATIVAN) 2 MG tablet TAKE ONE TABLET THREE TIMES A DAY 90 tablet 3  . metFORMIN (GLUCOPHAGE-XR) 500 MG 24 hr tablet TAKE 1 TABLET BY MOUTH THREE TIMES A DAY 270 tablet 3  . metoprolol tartrate (LOPRESSOR) 25 MG tablet Take 0.5 tablets (12.5 mg total) 2 (two) times daily by mouth. 90 tablet 3  . mirtazapine (REMERON) 15 MG tablet TAKE 1 TABLET BY MOUTH AT BEDTIME AT 6-8 PM 90 tablet 2  . nitrofurantoin  (MACRODANTIN) 100 MG capsule Take 1 capsule (100 mg total) by mouth every 12 (twelve) hours. 4 capsule 0  . ONETOUCH DELICA LANCETS 33G MISC Use to help check blood sugars twice a day. Dx E11.9 100 each 3  . promethazine (PHENERGAN) 12.5 MG tablet Take 1 tablet (12.5 mg total) by mouth every 6 (six) hours as needed. for nausea 60 tablet 0  . promethazine (PHENERGAN) 12.5 MG tablet TAKE 1 TABLET BY MOUTH EVERY 6 HOURS AS NEEDED FOR NAUSEA 60 tablet 1  . repaglinide (PRANDIN) 2 MG tablet Take 1 tablet (2 mg total) by mouth 3 (three) times daily before meals. 90 tablet 11  . tamsulosin (FLOMAX) 0.4 MG CAPS capsule TAKE 1 CAPSULE (0.4 MG TOTAL) DAILY BY MOUTH. 90 capsule 1  . umeclidinium-vilanterol (ANORO ELLIPTA) 62.5-25 MCG/INH AEPB Inhale 1 puff into the lungs daily. 1 each 3  . Vitamin D, Ergocalciferol, (DRISDOL) 50000 units CAPS capsule TAKE 1 CAPSULE (50,000 UNITS TOTAL) BY MOUTH ONCE A WEEK. 6 capsule 0  . zolpidem (AMBIEN) 10 MG tablet Take 1 tablet (10 mg total) by mouth at bedtime as needed for sleep. 30 tablet 3   No facility-administered medications prior to visit.     ROS: Review of Systems  Constitutional: Positive for fatigue. Negative for activity change, appetite  change, chills and unexpected weight change.  HENT: Negative for congestion, mouth sores and sinus pressure.   Eyes: Negative for visual disturbance.  Respiratory: Positive for cough and wheezing. Negative for chest tightness.   Gastrointestinal: Negative for abdominal pain and nausea.  Genitourinary: Negative for difficulty urinating, frequency and vaginal pain.  Musculoskeletal: Positive for arthralgias and gait problem. Negative for back pain.  Skin: Negative for pallor and rash.  Neurological: Positive for weakness. Negative for dizziness, tremors, numbness and headaches.  Psychiatric/Behavioral: Negative for confusion and sleep disturbance.    Objective:  BP 126/78 (BP Location: Left Arm, Patient Position:  Sitting, Cuff Size: Normal)   Pulse 74   Temp 99 F (37.2 C) (Oral)   Ht 5\' 5"  (1.651 m)   Wt 167 lb (75.8 kg)   SpO2 96%   BMI 27.79 kg/m   BP Readings from Last 3 Encounters:  03/20/18 126/78  12/16/17 124/78  10/14/17 126/78    Wt Readings from Last 3 Encounters:  03/20/18 167 lb (75.8 kg)  12/16/17 162 lb (73.5 kg)  10/14/17 158 lb (71.7 kg)    Physical Exam  Constitutional: She appears well-developed. No distress.  HENT:  Head: Normocephalic.  Right Ear: External ear normal.  Left Ear: External ear normal.  Nose: Nose normal.  Mouth/Throat: Oropharynx is clear and moist.  Eyes: Pupils are equal, round, and reactive to light. Conjunctivae are normal. Right eye exhibits no discharge. Left eye exhibits no discharge.  Neck: Normal range of motion. Neck supple. No JVD present. No tracheal deviation present. No thyromegaly present.  Cardiovascular: Normal rate, regular rhythm and normal heart sounds.  Pulmonary/Chest: No stridor. No respiratory distress. She has no wheezes.  Abdominal: Soft. Bowel sounds are normal. She exhibits no distension and no mass. There is no tenderness. There is no rebound and no guarding.  Musculoskeletal: She exhibits no edema or tenderness.  Lymphadenopathy:    She has no cervical adenopathy.  Neurological: She displays abnormal reflex. No cranial nerve deficit. She exhibits abnormal muscle tone. Coordination abnormal.  Skin: No rash noted. No erythema.  Psychiatric: She has a normal mood and affect. Her behavior is normal. Judgment and thought content normal.  in a w/c Trace edema B feet  Lab Results  Component Value Date   WBC 5.7 10/06/2017   HGB 10.8 (L) 10/06/2017   HCT 34.8 (L) 10/06/2017   PLT 213 10/06/2017   GLUCOSE 106 (H) 12/16/2017   CHOL 213 (H) 04/16/2017   TRIG 183.0 (H) 04/16/2017   HDL 93.40 04/16/2017   LDLDIRECT 100.8 10/26/2013   LDLCALC 83 04/16/2017   ALT 10 12/16/2017   AST 14 12/16/2017   NA 138 12/16/2017     K 4.9 12/16/2017   CL 101 12/16/2017   CREATININE 0.71 12/16/2017   BUN 14 12/16/2017   CO2 29 12/16/2017   TSH 1.43 12/28/2015   INR 1.12 11/08/2016   HGBA1C 7.1 (H) 12/16/2017    Dg Chest 2 View  Result Date: 10/03/2017 CLINICAL DATA:  Cough and confusion earlier today. Right upper chest pain. Sudden onset of aphasia. Blurry vision. Left-sided facial droop and weakness is chronic. EXAM: CHEST  2 VIEW COMPARISON:  11/08/2016 FINDINGS: Shallow inspiration. Heart size and pulmonary vascularity are normal. Calcification of the aorta. No airspace disease or consolidation in the lungs. Postoperative changes in the right humerus and left upper quadrant. Degenerative changes in the spine. IMPRESSION: No evidence of active pulmonary disease. Shallow inspiration. Aortic atherosclerosis. Electronically Signed  By: Burman Nieves M.D.   On: 10/03/2017 19:37   Ct Head Wo Contrast  Result Date: 10/03/2017 CLINICAL DATA:  Episode of aphasia with contraction of arms around 4 p.m. today. History of seizures. EXAM: CT HEAD WITHOUT CONTRAST TECHNIQUE: Contiguous axial images were obtained from the base of the skull through the vertex without intravenous contrast. COMPARISON:  Brain MRI 03/30/2011 FINDINGS: Brain: No evidence of acute infarction, hemorrhage, hydrocephalus, extra-axial collection or mass lesion/mass effect. White matter volume loss with generalized thinning of the corpus callosum that is advanced. There is associated low-density in the bilateral cerebral white matter. Patient has history of anoxic brain injury. Vascular: Atherosclerotic calcification.  No hyperdense vessel. Skull: No acute or aggressive finding. Sinuses/Orbits: Chronic patchy appearing opacification of paranasal sinuses. The hypoplastic right sphenoid sinus is completely opacified. IMPRESSION: 1. No acute finding. 2. 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 Electronically Signed   By: Marnee Spring M.D.   On:  10/03/2017 19:32   Mr Laqueta Jean ZO Contrast  Result Date: 10/04/2017 CLINICAL DATA:  Initial evaluation for acute altered mental status. EXAM: MRI HEAD WITHOUT AND WITH CONTRAST TECHNIQUE: Multiplanar, multiecho pulse sequences of the brain and surrounding structures were obtained without and with intravenous contrast. CONTRAST:  12mL MULTIHANCE GADOBENATE DIMEGLUMINE 529 MG/ML IV SOLN COMPARISON:  Prior CT from 10/03/2017. FINDINGS: Brain: Study mildly degraded by motion artifact. Diffuse prominence of the CSF containing spaces compatible with generalized cerebral atrophy. Patchy and confluent T2/FLAIR hyperintensity within the periventricular and deep white matter both cerebral hemispheres, most consistent with chronic small vessel ischemic disease, mild to moderate nature. Superimposed remote lacunar infarcts present within the bilateral periventricular white matter. No abnormal foci of restricted diffusion to suggest acute or subacute ischemia. Gray-white matter differentiation maintained. No areas of remote cortical infarction. No evidence for acute or chronic intracranial hemorrhage. No mass lesion, midline shift or mass effect. Mild ventricular prominence related global parenchymal volume loss of hydrocephalus. No extra-axial fluid collection. No abnormal enhancement. Major dural sinuses are grossly patent. Pituitary gland suprasellar region normal. Midline structures intact and normal. Vascular: Major intracranial vascular flow voids maintained at the skullbase. Skull and upper cervical spine: Craniocervical junction normal. Upper cervical spine normal. Bone marrow signal intensity within normal limits. No scalp soft tissue abnormality. Sinuses/Orbits: Globes and orbital soft tissues within normal limits. Scattered mucosal thickening within the ethmoidal air cells and maxillary sinuses. Small bilateral mastoid effusions, of doubtful significance. Inner ear structures normal. Other: None. IMPRESSION: 1. No  acute intracranial abnormality. 2. Generalized age-related cerebral atrophy with mild to moderate chronic small vessel ischemic disease. Electronically Signed   By: Rise Mu M.D.   On: 10/04/2017 06:24    Assessment & Plan:   There are no diagnoses linked to this encounter.   No orders of the defined types were placed in this encounter.    Follow-up: No follow-ups on file.  [name], MD

## 2018-03-20 NOTE — Assessment & Plan Note (Signed)
Labs Loose wt - wt gain on "Chitos"

## 2018-03-20 NOTE — Assessment & Plan Note (Signed)
On B12 

## 2018-03-23 ENCOUNTER — Other Ambulatory Visit: Payer: Self-pay | Admitting: Internal Medicine

## 2018-03-23 MED ORDER — METFORMIN HCL ER 750 MG PO TB24: 750.0000 mg | ORAL_TABLET | Freq: Two times a day (BID) | ORAL | 3 refills | Status: DC

## 2018-04-06 ENCOUNTER — Other Ambulatory Visit: Payer: Self-pay | Admitting: Internal Medicine

## 2018-04-08 ENCOUNTER — Other Ambulatory Visit: Payer: Self-pay | Admitting: Internal Medicine

## 2018-04-10 DIAGNOSIS — M25512 Pain in left shoulder: Secondary | ICD-10-CM | POA: Diagnosis not present

## 2018-04-10 DIAGNOSIS — Z79891 Long term (current) use of opiate analgesic: Secondary | ICD-10-CM | POA: Diagnosis not present

## 2018-04-10 DIAGNOSIS — G894 Chronic pain syndrome: Secondary | ICD-10-CM | POA: Diagnosis not present

## 2018-04-10 DIAGNOSIS — M47816 Spondylosis without myelopathy or radiculopathy, lumbar region: Secondary | ICD-10-CM | POA: Diagnosis not present

## 2018-04-17 ENCOUNTER — Ambulatory Visit: Payer: Medicare Other | Attending: Internal Medicine | Admitting: Physical Therapy

## 2018-04-17 ENCOUNTER — Encounter: Payer: Self-pay | Admitting: Physical Therapy

## 2018-04-17 ENCOUNTER — Other Ambulatory Visit: Payer: Self-pay

## 2018-04-17 DIAGNOSIS — R278 Other lack of coordination: Secondary | ICD-10-CM | POA: Diagnosis not present

## 2018-04-17 DIAGNOSIS — M6281 Muscle weakness (generalized): Secondary | ICD-10-CM | POA: Insufficient documentation

## 2018-04-17 DIAGNOSIS — R2689 Other abnormalities of gait and mobility: Secondary | ICD-10-CM | POA: Diagnosis not present

## 2018-04-17 DIAGNOSIS — R2681 Unsteadiness on feet: Secondary | ICD-10-CM | POA: Diagnosis not present

## 2018-04-18 ENCOUNTER — Encounter: Payer: Self-pay | Admitting: Physical Therapy

## 2018-04-18 NOTE — Therapy (Signed)
[facility] [address] [place], [place], [address] Phone: [phone]   Fax:  [phone]  Physical Therapy Evaluation  Patient Details  Name: [name] MRN: [id number] Date of Birth: [birth year] Referring Provider: Dr. Jacinta Shoe   Encounter Date: 04/17/2018  PT End of Session - 04/18/18 1357    Visit Number  1    Number of Visits  17    Date for PT Re-Evaluation  06/18/18    Authorization Type  Medicare and Medicaid    Authorization Time Period  04-17-18 - 07-16-18    PT Start Time  1447    PT Stop Time  1535    PT Time Calculation (min)  48 min    Equipment Utilized During Treatment  Gait belt       Past Medical History:  Diagnosis Date  . Anoxic brain injury (HCC)   . Anxiety   . Aortic atherosclerosis (HCC) 10/04/2017  . Cellulitis and abscess of other specified site 10/11/2010   Qualifier: Diagnosis of  By: Plotnikov MD, Georgina Quint   . Chronic neck pain   . Chronic pain in shoulder   . COPD (chronic obstructive pulmonary disease) (HCC)   . Depression   . Fracture of left humerus 06/24/2014  . Gait disorder   . GERD (gastroesophageal reflux disease)   . Hernia of abdominal cavity   . 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 Hyperlipidemia   . LBP (low back pain)   . Neuropathy   . Osteoarthritis   . Seizure disorder (HCC)   . Seizures (HCC)   . Type II or unspecified type diabetes mellitus without mention of complication, not stated as uncontrolled 2010  . UTI (urinary tract infection) 10/2017  . Vitamin B 12 deficiency 2011    Past Surgical History:  Procedure Laterality Date  . COLONOSCOPY    . DIAGNOSTIC LAPAROSCOPY     PMH: Exploratory lap  . HERNIA REPAIR     incisional  . ORIF HUMERUS FRACTURE Left 06/24/2014   Procedure: LEFT OPEN REDUCTION INTERNAL FIXATION (ORIF) PROXIMAL HUMERUS FRACTURE;  Surgeon: Eulas Post, MD;  Location: [facility] OR;   Service: Orthopedics;  Laterality: Left;  . PANCREATECTOMY    . TONSILLECTOMY    . TUBAL LIGATION      There were no vitals filed for this visit.   Subjective Assessment - 04/18/18 1341    Subjective  Pt presents for PT evaluation in transport wheelchair - accompanied by her CNA, Aurther Loft    Patient Stated Goals  "I need to walk again"         [facility] PT Assessment - 04/18/18 0001      Assessment   Medical Diagnosis  Gait disorder:  Ataxia    Referring Provider  Dr. Macarthur Critchley Plotnikov    Onset Date/Surgical Date  10/03/17    Prior Therapy  Pt's most recent prior OP PT at this facility was 3-26 - 02-21-17      Precautions   Precautions  Fall;Other (comment) decreased cognition      Restrictions   Weight Bearing Restrictions  No      Balance Screen   Has the patient fallen in the past 6 months  Yes    How many times?  3    Has the patient had a decrease in activity level because of a fear of falling?   No    Is the patient reluctant to leave their  home because of a fear of falling?   Yes      Home Environment   Living Environment  Private residence    Living Arrangements  Spouse/significant other    Type of Home  House    Home Access  Ramped entrance    Home Layout  One level    Home Equipment  Walker - 4 wheels;Transport chair;Shower seat      Prior Function   Level of Independence  Needs assistance with gait;Needs assistance with ADLs;Needs assistance with homemaking    Leisure  watches TV alot      ROM / Strength   AROM / PROM / Strength  Strength      Strength   Overall Strength  Within functional limits for tasks performed      Transfers   Transfers  Sit to Stand    Sit to Stand  5: Supervision    Five time sit to stand comments   15.38 pt does not stand fully erect -used 1 UE support    Number of Reps  -- 3    Transfer Cueing  cues for positioning      Ambulation/Gait   Ambulation/Gait  Yes    Ambulation/Gait Assistance  4: Min assist    Ambulation/Gait  Assistance Details  pt does not stand erect     Ambulation Distance (Feet)  115 Feet    Assistive device  Rolling walker    Gait Pattern  Decreased step length - right;Decreased step length - left;Ataxic;Trunk rotated posteriorly on right;Decreased trunk rotation    Ambulation Surface  Level;Indoor    Gait velocity  22.25 = 1.47 ft/sec    Gait Comments  pt has difficulty with turns and with negotiation of transitional surfaces      Standardized Balance Assessment   Standardized Balance Assessment  Timed Up and Go Test      Timed Up and Go Test   Normal TUG (seconds)  43.84 with RW                Objective measurements completed on examination: See above findings.              PT Education - 04/18/18 1356    Education Details  compared today's TUG score and gait velocity score with those recorded at time of previous admission in March 2018    Person(s) Educated  Patient;Caregiver(s)    Methods  Explanation    Comprehension  Verbalized understanding       PT Short Term Goals - 04/18/18 1415      PT SHORT TERM GOAL #1   Title  Pt will perform HEP for standing balance with caregiver's assistance.    Time  4    Period  Weeks    Status  New    Target Date  05/18/18      PT SHORT TERM GOAL #2   Title  Will assess BERG and improve score by 3 points in order to indicate decreased fall risk.      Time  4    Period  Weeks    Status  New    Target Date  05/18/18      PT SHORT TERM GOAL #3   Title  Pt will improve 5TSS to <12 seconds with single UE support in order to indicate improved functional strength.      Baseline  15.38 secs with 1 UE support on 04-17-18    Time  4  Period  Weeks    Status  New    Target Date  05/18/18      PT SHORT TERM GOAL #4   Title  Pt will improve TUG to <39 secs in order to indicate decreased fall risk.      Baseline  43.84 secs with RW on 04-17-18    Time  4    Period  Weeks    Status  New    Target Date  05/18/18       PT SHORT TERM GOAL #5   Title  Pt will ambulate x 230' with RW with supervision, including turning around curve on track, to demonstrate improved endurance and balance with ambulation.    Baseline  115' with min assist with curve negotiation on track - 04-17-18    Time  4    Period  Weeks    Status  New    Target Date  05/18/18        PT Long Term Goals - 04/18/18 1420      PT LONG TERM GOAL #1   Title  Pt will perform updated HEP for balance and functional strengthening with caregiver's assistance.    Time  8    Period  Weeks    Status  New    Target Date  06/18/18      PT LONG TERM GOAL #2   Title  Pt will improve BERG balance score by 6 points from baseline in order to indicate decreased fall risk.      Time  8    Period  Weeks    Status  New    Target Date  06/18/18      PT LONG TERM GOAL #3   Title  Pt will perform TUG with RW in </= 35 secs in order to indicate decreased fall risk.      Baseline  43.84 secs with RW on 04-17-18    Time  8    Period  Weeks    Status  New    Target Date  06/18/18      PT LONG TERM GOAL #4   Title  Pt will perform 5x sit<>stand without UE support in </= 10 secs  in order to indicate improved functional strength.      Baseline  15.38 secs with LUE support on 04-17-18    Time  8    Period  Weeks    Status  New    Target Date  06/18/18      PT LONG TERM GOAL #5   Title  Pt will amb. 58' with RW with SBA on even surfaces to demo increased endurance/activity tolerance.    Baseline  115' with RW on 04-17-18    Time  8    Period  Weeks    Status  New    Target Date  06/18/18             Plan - 04/18/18 1358    Clinical Impression Statement  Pt is a 67 yr old lady known to this clinic from previous OP PT admissions.  Pt presents with ataxic gait pattern due to h/o anoxic brain injury sustained in 1999.  Pt had sepsis in Feb. 2018 and most recently episode of acute encephalopathy with aphasia & contraction of arms with UTI; pt was  hospitalized from 10-03-17 - 10-06-17.  Pt has h/o seizures, anoxic brain injury and COPD. Pt presents with gait abnormality with decreased balance and postural deviations.  Pt has difficulty with turning and with negotiating transitional surfaces.       History and Personal Factors relevant to plan of care:  acute encephalopathy/ sepsis due to UTI (10-03-17); h/o anoxic brain injury 1999; h/o CVA: Type 2 DM:  COPD; gait disorder with ataxia; h/o chronic back pain    Clinical Presentation  Stable    Clinical Presentation due to:  h/o anoxic brain injury; encephalopathy 10-03-17    Clinical Decision Making  Low    Rehab Potential  Fair    Clinical Impairments Affecting Rehab Potential  chronicity of deficits and length of time since onset:  pt has had several OP PT admissions/course of therapy at this facility    PT Frequency  2x / week    PT Duration  8 weeks    PT Treatment/Interventions  ADLs/Self Care Home Management;Functional mobility training;Stair training;Gait training;DME Instruction;Therapeutic activities;Therapeutic exercise;Balance training;Neuromuscular re-education;Patient/family education    PT Next Visit Plan  do Berg; HEP for balance (pt should be familiar with these exs. from previous admission); cont gait training    PT Home Exercise Plan  standing balance exercises - pt may benefit from some PWR! exercises    Consulted and Agree with Plan of Care  Patient;Other (Comment) caregiver Aurther Lofterry       Patient will benefit from skilled therapeutic intervention in order to improve the following deficits and impairments:  Abnormal gait, Decreased activity tolerance, Decreased balance, Decreased cognition, Decreased coordination, Decreased mobility, Decreased knowledge of use of DME, Decreased strength, Postural dysfunction, Pain  Visit Diagnosis: Other abnormalities of gait and mobility - Plan: PT plan of care cert/re-cert  Unsteadiness on feet - Plan: PT plan of care cert/re-cert  Muscle  weakness (generalized) - Plan: PT plan of care cert/re-cert  Other lack of coordination - Plan: PT plan of care cert/re-cert     Problem List Patient Active Problem List   Diagnosis Date Noted  . Weakness 10/04/2017  . Acute encephalopathy 10/04/2017  . Acute lower UTI 10/04/2017  . Aortic atherosclerosis (HCC) 10/04/2017  . Grief 09/23/2017  . Osteoporosis 04/16/2017  . Irregular heart beat 11/15/2016  . Hyperglycemia   . Oral thrush 07/18/2016  . Odynophagia 07/18/2016  . Anoxic brain injury (HCC) 12/29/2015  . Gait disorder 12/28/2015  . Well adult exam 12/08/2014  . Fracture of left humerus 06/24/2014  . Humerus fracture 06/24/2014  . Left elbow pain 11/03/2013  . Ankle ulcer (HCC) 04/02/2012  . Decubitus ulcer of coccyx 05/21/2011  . B12 deficiency 04/04/2010  . HYPERLIPIDEMIA 04/04/2010  . Anxiety state 11/14/2009  . Vitamin D deficiency 10/21/2009  . DM2 (diabetes mellitus, type 2) (HCC) 07/07/2009  . OSTEOARTHRITIS 02/25/2009  . LOW BACK PAIN 02/25/2009  . TOBACCO USER 01/13/2009  . DEPRESSION 02/26/2007  . PAIN, CHRONIC NEC 02/26/2007  . Venous (peripheral) insufficiency 02/26/2007  . GERD 02/26/2007  . HERNIA, INCISIONAL 02/26/2007  . SHOULDER PAIN, LEFT 02/26/2007  . 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 INSOMNIA 02/26/2007  . Abnormality of gait 02/26/2007  . LEG EDEMA, CHRONIC 02/26/2007    Kary Kosilday, Heber Hoog Suzanne, PT 04/18/2018, 2:28 PM  [place] [facility] [address] [place], [place], [address] Phone: [phone]   Fax:  [phone]  Name: [name] MRN: [id number] Date of Birth: [birth year]

## 2018-04-26 ENCOUNTER — Other Ambulatory Visit: Payer: Self-pay | Admitting: Internal Medicine

## 2018-05-06 ENCOUNTER — Encounter: Payer: Self-pay | Admitting: Rehabilitation

## 2018-05-06 ENCOUNTER — Ambulatory Visit: Payer: Medicare Other | Attending: Internal Medicine | Admitting: Rehabilitation

## 2018-05-06 DIAGNOSIS — R2681 Unsteadiness on feet: Secondary | ICD-10-CM | POA: Diagnosis not present

## 2018-05-06 DIAGNOSIS — R2689 Other abnormalities of gait and mobility: Secondary | ICD-10-CM | POA: Diagnosis not present

## 2018-05-06 DIAGNOSIS — M6281 Muscle weakness (generalized): Secondary | ICD-10-CM | POA: Insufficient documentation

## 2018-05-06 NOTE — Therapy (Signed)
[facility] [address] [place], [place], [address] Phone: [phone]   Fax:  [phone]  Physical Therapy Treatment  Patient Details  Name: [name] MRN: [id number] Date of Birth: 02/28/1951 Referring Provider: Dr. Jacinta ShoeAleksei Plotnikov   Encounter Date: 05/06/2018  PT End of Session - 05/06/18 1220    Visit Number  2    Number of Visits  17    Date for PT Re-Evaluation  06/18/18    Authorization Type  Medicare and Medicaid    Authorization Time Period  04-17-18 - 07-16-18    PT Start Time  1017    PT Stop Time  1100    PT Time Calculation (min)  43 min    Equipment Utilized During Treatment  Gait belt       Past Medical History:  Diagnosis Date  . Anoxic brain injury (HCC)   . Anxiety   . Aortic atherosclerosis (HCC) 10/04/2017  . Cellulitis and abscess of other specified site 10/11/2010   Qualifier: Diagnosis of  By: Plotnikov MD, Georgina QuintAleksei V   . Chronic neck pain   . Chronic pain in shoulder   . COPD (chronic obstructive pulmonary disease) (HCC)   . Depression   . Fracture of left humerus 06/24/2014  . Gait disorder   . GERD (gastroesophageal reflux disease)   . Hernia of abdominal cavity   . 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 Hyperlipidemia   . LBP (low back pain)   . Neuropathy   . Osteoarthritis   . Seizure disorder (HCC)   . Seizures (HCC)   . Type II or unspecified type diabetes mellitus without mention of complication, not stated as uncontrolled 2010  . UTI (urinary tract infection) 10/2017  . Vitamin B 12 deficiency 2011    Past Surgical History:  Procedure Laterality Date  . COLONOSCOPY    . DIAGNOSTIC LAPAROSCOPY     PMH: Exploratory lap  . HERNIA REPAIR     incisional  . ORIF HUMERUS FRACTURE Left 06/24/2014   Procedure: LEFT OPEN REDUCTION INTERNAL FIXATION (ORIF) PROXIMAL HUMERUS FRACTURE;  Surgeon: Eulas PostJoshua P Landau, MD;  Location: [facility] OR;   Service: Orthopedics;  Laterality: Left;  . PANCREATECTOMY    . TONSILLECTOMY    . TUBAL LIGATION      There were no vitals filed for this visit.  Subjective Assessment - 05/06/18 1020    Subjective  Pt reports no changes, no falls since last visit.     Patient is accompained by:  Family member Terry    Patient Stated Goals  "I need to walk again"    Currently in Pain?  Yes    Pain Score  9     Pain Location  Back    Pain Orientation  Lower    Pain Descriptors / Indicators  Aching    Pain Type  Chronic pain    Pain Onset  More than a month ago    Pain Frequency  Constant    Aggravating Factors   sitting for a long time    Pain Relieving Factors  moving around, pain medication (when right medication)                       [facility] Adult PT Treatment/Exercise - 05/06/18 1021      Standardized Balance Assessment   Standardized Balance Assessment  Berg Balance Test      [facility]  Sit to Stand  Able to stand  independently using hands    Standing Unsupported  Able to stand 2 minutes with supervision    Sitting with Back Unsupported but Feet Supported on Floor or Stool  Able to sit safely and securely 2 minutes    Stand to Sit  Controls descent by using hands    Transfers  Able to transfer safely, definite need of hands    Standing Unsupported with Eyes Closed  Able to stand 10 seconds with supervision    Standing Ubsupported with Feet Together  Needs help to attain position and unable to hold for 15 seconds held 20 secs    From Standing, Reach Forward with Outstretched Arm  Reaches forward but needs supervision    From Standing Position, Pick up Object from Floor  Able to pick up shoe, needs supervision    From Standing Position, Turn to Look Behind Over each Shoulder  Needs supervision when turning    Turn 360 Degrees  Needs assistance while turning    Standing Unsupported, Alternately Place Feet on Step/Stool  Needs assistance to keep from falling or  unable to try    Standing Unsupported, One Foot in Front  Needs help to step but can hold 15 seconds    Standing on One Leg  Unable to try or needs assist to prevent fall    Total Score  25           Do these in bed.    Bracing With Bridging (Hook-Lying)    With neutral spine, tighten pelvic floor and abdominals and hold. Lift bottom. Repeat __10_ times. Do __1_ times a day. Have her count out loud so that we know she is breathing.    Copyright  VHI. All rights reserved.  Hip Flexion / Knee Extension: Straight-Leg Raise (Eccentric)    Lie on back. Lift leg with knee straight. Count out loud to 5 when lifting, take a breath and then count out loud to 5 when lowering leg.  _10__ reps per set, _1_ sets per day, __5-7_ days per week. Lower like elevator, stopping at each floor.   Copyright  VHI. All rights reserved.  Hip Flexor Stretch    Lying on back near edge of bed, bend one leg, foot flat. Hang other leg over edge, relaxed, thigh resting entirely on bed for __2__ minutes. Repeat __2__ times. Do _1-2___ sessions per day.  This one may be easier to do on couch so that it is a little lower than laying on edge of bed.    http://gt2.exer.us/347  Copyright  VHI. All rights reserved.   DO THESE STANDING AT WALKER WITH CHAIR BEHIND YOU.   EXTENSION: Standing (Active)    Stand, both feet flat. Draw right leg behind body as far as possible. When moving the right leg, tap right leg back behind you and then back to the middle.  Complete _1__ sets of _10__ repetitions. Perform _1-2__ sessions per day.  http://gtsc.exer.us/77  Copyright  VHI. All rights reserved.  ABDUCTION: Standing (Active)    Stand, feet flat. Lift right leg out to side.  Complete _1_ sets of _10__ repetitions. Perform _1-2__ sessions per day.  When moving right leg, step right foot out to the side and then back to the middle.     http://gtsc.exer.us/111  Copyright  VHI.  All rights reserved.   PT Education - 05/06/18 1220    Education Details  compared BERG scores from today to D/C in may  of last year, went over previous HEP which is still appropriate for pt.     Person(s) Educated  Patient;Caregiver(s)    Methods  Explanation;Demonstration;Handout    Comprehension  Verbalized understanding;Returned demonstration       PT Short Term Goals - 04/18/18 1415      PT SHORT TERM GOAL #1   Title  Pt will perform HEP for standing balance with caregiver's assistance.    Time  4    Period  Weeks    Status  New    Target Date  05/18/18      PT SHORT TERM GOAL #2   Title  Will assess BERG and improve score by 3 points in order to indicate decreased fall risk.      Time  4    Period  Weeks    Status  New    Target Date  05/18/18      PT SHORT TERM GOAL #3   Title  Pt will improve 5TSS to <12 seconds with single UE support in order to indicate improved functional strength.      Baseline  15.38 secs with 1 UE support on 04-17-18    Time  4    Period  Weeks    Status  New    Target Date  05/18/18      PT SHORT TERM GOAL #4   Title  Pt will improve TUG to <39 secs in order to indicate decreased fall risk.      Baseline  43.84 secs with RW on 04-17-18    Time  4    Period  Weeks    Status  New    Target Date  05/18/18      PT SHORT TERM GOAL #5   Title  Pt will ambulate x 230' with RW with supervision, including turning around curve on track, to demonstrate improved endurance and balance with ambulation.    Baseline  115' with min assist with curve negotiation on track - 04-17-18    Time  4    Period  Weeks    Status  New    Target Date  05/18/18        PT Long Term Goals - 04/18/18 1420      PT LONG TERM GOAL #1   Title  Pt will perform updated HEP for balance and functional strengthening with caregiver's assistance.    Time  8    Period  Weeks    Status  New    Target Date  06/18/18      PT LONG TERM GOAL #2   Title  Pt will improve BERG  balance score by 6 points from baseline in order to indicate decreased fall risk.      Time  8    Period  Weeks    Status  New    Target Date  06/18/18      PT LONG TERM GOAL #3   Title  Pt will perform TUG with RW in </= 35 secs in order to indicate decreased fall risk.      Baseline  43.84 secs with RW on 04-17-18    Time  8    Period  Weeks    Status  New    Target Date  06/18/18      PT LONG TERM GOAL #4   Title  Pt will perform 5x sit<>stand without UE support in </= 10 secs  in order to indicate improved functional strength.  Baseline  15.38 secs with LUE support on 04-17-18    Time  8    Period  Weeks    Status  New    Target Date  06/18/18      PT LONG TERM GOAL #5   Title  Pt will amb. 21' with RW with SBA on even surfaces to demo increased endurance/activity tolerance.    Baseline  115' with RW on 04-17-18    Time  8    Period  Weeks    Status  New    Target Date  06/18/18            Plan - 05/06/18 1220    Clinical Impression Statement  Skilled session focused on formal assessment of BERG  balance test with score of 25/56 compared to 30/56 score from DC last May.  Also reviewed previous HEP given at last episode of care and these are still appropriate, therefore maintained these exercises for pt to perform at home with cues/assist from caregiver/husband.     Rehab Potential  Fair    Clinical Impairments Affecting Rehab Potential  chronicity of deficits and length of time since onset:  pt has had several OP PT admissions/course of therapy at this facility    PT Frequency  2x / week    PT Duration  8 weeks    PT Treatment/Interventions  ADLs/Self Care Home Management;Functional mobility training;Stair training;Gait training;DME Instruction;Therapeutic activities;Therapeutic exercise;Balance training;Neuromuscular re-education;Patient/family education    PT Next Visit Plan   cont gait training with RW for distance/quality, L lateral weight shift.     PT Home  Exercise Plan  standing balance exercises - pt may benefit from some PWR! exercises    Consulted and Agree with Plan of Care  Patient;Other (Comment) caregiver Aurther Loft       Patient will benefit from skilled therapeutic intervention in order to improve the following deficits and impairments:  Abnormal gait, Decreased activity tolerance, Decreased balance, Decreased cognition, Decreased coordination, Decreased mobility, Decreased knowledge of use of DME, Decreased strength, Postural dysfunction, Pain  Visit Diagnosis: Other abnormalities of gait and mobility  Unsteadiness on feet  Muscle weakness (generalized)     Problem List Patient Active Problem List   Diagnosis Date Noted  . Weakness 10/04/2017  . Acute encephalopathy 10/04/2017  . Acute lower UTI 10/04/2017  . Aortic atherosclerosis (HCC) 10/04/2017  . Grief 09/23/2017  . Osteoporosis 04/16/2017  . Irregular heart beat 11/15/2016  . Hyperglycemia   . Oral thrush 07/18/2016  . Odynophagia 07/18/2016  . Anoxic brain injury (HCC) 12/29/2015  . Gait disorder 12/28/2015  . Well adult exam 12/08/2014  . Fracture of left humerus 06/24/2014  . Humerus fracture 06/24/2014  . Left elbow pain 11/03/2013  . Ankle ulcer (HCC) 04/02/2012  . Decubitus ulcer of coccyx 05/21/2011  . B12 deficiency 04/04/2010  . HYPERLIPIDEMIA 04/04/2010  . Anxiety state 11/14/2009  . Vitamin D deficiency 10/21/2009  . DM2 (diabetes mellitus, type 2) (HCC) 07/07/2009  . OSTEOARTHRITIS 02/25/2009  . LOW BACK PAIN 02/25/2009  . TOBACCO USER 01/13/2009  . DEPRESSION 02/26/2007  . PAIN, CHRONIC NEC 02/26/2007  . Venous (peripheral) insufficiency 02/26/2007  . GERD 02/26/2007  . HERNIA, INCISIONAL 02/26/2007  . SHOULDER PAIN, LEFT 02/26/2007  . 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 INSOMNIA 02/26/2007  . Abnormality of gait 02/26/2007  . LEG EDEMA, CHRONIC 02/26/2007    Harriet Butte, PT, MPT [facility] [address]  [address] [place], [place], [address] Phone: [phone]   Fax:  [phone] 05/06/18, 12:25 PM  Name: [name] MRN: [id number] Date of Birth: [birth year]

## 2018-05-06 NOTE — Patient Instructions (Signed)
Do these in bed.    Bracing With Bridging (Hook-Lying)    With neutral spine, tighten pelvic floor and abdominals and hold. Lift bottom. Repeat __10_ times. Do __1_ times a day. Have her count out loud so that we know she is breathing.    Copyright  VHI. All rights reserved.  Hip Flexion / Knee Extension: Straight-Leg Raise (Eccentric)    Lie on back. Lift leg with knee straight. Count out loud to 5 when lifting, take a breath and then count out loud to 5 when lowering leg.  _10__ reps per set, _1_ sets per day, __5-7_ days per week. Lower like elevator, stopping at each floor.   Copyright  VHI. All rights reserved.  Hip Flexor Stretch    Lying on back near edge of bed, bend one leg, foot flat. Hang other leg over edge, relaxed, thigh resting entirely on bed for __2__ minutes. Repeat __2__ times. Do _1-2___ sessions per day.  This one may be easier to do on couch so that it is a little lower than laying on edge of bed.    http://gt2.exer.us/347  Copyright  VHI. All rights reserved.   DO THESE STANDING AT WALKER WITH CHAIR BEHIND YOU.   EXTENSION: Standing (Active)    Stand, both feet flat. Draw right leg behind body as far as possible. When moving the right leg, tap right leg back behind you and then back to the middle.  Complete _1__ sets of _10__ repetitions. Perform _1-2__ sessions per day.  http://gtsc.exer.us/77  Copyright  VHI. All rights reserved.  ABDUCTION: Standing (Active)    Stand, feet flat. Lift right leg out to side.  Complete _1_ sets of _10__ repetitions. Perform _1-2__ sessions per day.  When moving right leg, step right foot out to the side and then back to the middle.     http://gtsc.exer.us/111  Copyright  VHI. All rights reserved.

## 2018-05-08 ENCOUNTER — Ambulatory Visit: Payer: Medicare Other | Admitting: Rehabilitation

## 2018-05-08 ENCOUNTER — Encounter: Payer: Self-pay | Admitting: Rehabilitation

## 2018-05-08 DIAGNOSIS — R2689 Other abnormalities of gait and mobility: Secondary | ICD-10-CM | POA: Diagnosis not present

## 2018-05-08 DIAGNOSIS — R2681 Unsteadiness on feet: Secondary | ICD-10-CM | POA: Diagnosis not present

## 2018-05-08 DIAGNOSIS — M6281 Muscle weakness (generalized): Secondary | ICD-10-CM | POA: Diagnosis not present

## 2018-05-08 NOTE — Therapy (Signed)
[facility] [address] [place], [place], [address] Phone: [phone]   Fax:  [phone]  Physical Therapy Treatment  Patient Details  Name: [name] MRN: [id number] Date of Birth: 01/04/1951 Referring Provider: Dr. Jacinta Shoe   Encounter Date: 05/08/2018  PT End of Session - 05/08/18 1238    Visit Number  3    Number of Visits  17    Date for PT Re-Evaluation  06/18/18    Authorization Type  Medicare and Medicaid    Authorization Time Period  04-17-18 - 07-16-18    PT Start Time  1015    PT Stop Time  1100    PT Time Calculation (min)  45 min    Equipment Utilized During Treatment  Gait belt       Past Medical History:  Diagnosis Date  . Anoxic brain injury (HCC)   . Anxiety   . Aortic atherosclerosis (HCC) 10/04/2017  . Cellulitis and abscess of other specified site 10/11/2010   Qualifier: Diagnosis of  By: Plotnikov MD, Georgina Quint   . Chronic neck pain   . Chronic pain in shoulder   . COPD (chronic obstructive pulmonary disease) (HCC)   . Depression   . Fracture of left humerus 06/24/2014  . Gait disorder   . GERD (gastroesophageal reflux disease)   . Hernia of abdominal cavity   . 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 Hyperlipidemia   . LBP (low back pain)   . Neuropathy   . Osteoarthritis   . Seizure disorder (HCC)   . Seizures (HCC)   . Type II or unspecified type diabetes mellitus without mention of complication, not stated as uncontrolled 2010  . UTI (urinary tract infection) 10/2017  . Vitamin B 12 deficiency 2011    Past Surgical History:  Procedure Laterality Date  . COLONOSCOPY    . DIAGNOSTIC LAPAROSCOPY     PMH: Exploratory lap  . HERNIA REPAIR     incisional  . ORIF HUMERUS FRACTURE Left 06/24/2014   Procedure: LEFT OPEN REDUCTION INTERNAL FIXATION (ORIF) PROXIMAL HUMERUS FRACTURE;  Surgeon: Eulas Post, MD;  Location: [facility] OR;   Service: Orthopedics;  Laterality: Left;  . PANCREATECTOMY    . TONSILLECTOMY    . TUBAL LIGATION      There were no vitals filed for this visit.  Subjective Assessment - 05/08/18 1016    Subjective  No changes since last visit, no falls.     Patient is accompained by:  Family member    Patient Stated Goals  "I need to walk again"    Currently in Pain?  No/denies                       [facility] Adult PT Treatment/Exercise - 05/08/18 0001      Ambulation/Gait   Ambulation/Gait  Yes    Ambulation/Gait Assistance  4: Min guard    Ambulation/Gait Assistance Details  Continue to address gait during session with RW in order to improve qualty and distance.  Pt requires continuous cuing for upright posture, improved positioning inside of RW, and increased stride length (esp R step length).  Note that in straight path, pt does well with cues, however when making turns to the L, she tends make short shuffled steps, needing increased cues.  Also some guiding assist for RW. Pt able to ambulate x 115' x 1, 100' x 1 (from restroom to  back of gym), and another 45'.      Ambulation Distance (Feet)  115 Feet    Assistive device  Rolling walker    Gait Pattern  Decreased step length - right;Decreased step length - left;Ataxic;Trunk rotated posteriorly on right;Decreased trunk rotation    Ambulation Surface  Level;Indoor      Therapeutic Activites    Therapeutic Activities  ADL's    ADL's  Assisted pt to restroom during session with use of RW in order to addressing making L turns and standing balance while negotiating clothing.  Cues for sequencing when making left turns as well as cues for improving L lateral weight shift during standing for improved balance.  Pt able to do at min/guard level for all tasks.        Neuro Re-ed    Neuro Re-ed Details   In // bars with BUE>single UE support (sometimes L and sometimes R only) tapping RLE to 6" step x 2 sets of 10 reps, stepping forwards up/down  6" step and then stepping up backwards x 5 reps, tapping cones alternating LEs x 10 reps progressing to RLE taps only with single UE support x 5 reps with max cues and facilitation for L lateral weight shift.  Note that during all tasks, pt facing mirror in order to allow her to use visual feedback to self correct posture.  Pt very anxious and fearful of falling when asked to shift weight to the L and maintain for any period of time.  Unable to do any task without some support of bars and up to mod A from PT.        Exercises   Exercises  Knee/Hip      Knee/Hip Exercises: Aerobic   Stepper  Sci fit x 5 mins at level 2.0 with BUEs/LEs             PT Education - 05/08/18 1017    Education Details  continuing to practice turning left at home.     Person(s) Educated  Patient    Methods  Explanation    Comprehension  Verbalized understanding       PT Short Term Goals - 04/18/18 1415      PT SHORT TERM GOAL #1   Title  Pt will perform HEP for standing balance with caregiver's assistance.    Time  4    Period  Weeks    Status  New    Target Date  05/18/18      PT SHORT TERM GOAL #2   Title  Will assess BERG and improve score by 3 points in order to indicate decreased fall risk.      Time  4    Period  Weeks    Status  New    Target Date  05/18/18      PT SHORT TERM GOAL #3   Title  Pt will improve 5TSS to <12 seconds with single UE support in order to indicate improved functional strength.      Baseline  15.38 secs with 1 UE support on 04-17-18    Time  4    Period  Weeks    Status  New    Target Date  05/18/18      PT SHORT TERM GOAL #4   Title  Pt will improve TUG to <39 secs in order to indicate decreased fall risk.      Baseline  43.84 secs with RW on 04-17-18    Time  4  Period  Weeks    Status  New    Target Date  05/18/18      PT SHORT TERM GOAL #5   Title  Pt will ambulate x 230' with RW with supervision, including turning around curve on track, to  demonstrate improved endurance and balance with ambulation.    Baseline  115' with min assist with curve negotiation on track - 04-17-18    Time  4    Period  Weeks    Status  New    Target Date  05/18/18        PT Long Term Goals - 04/18/18 1420      PT LONG TERM GOAL #1   Title  Pt will perform updated HEP for balance and functional strengthening with caregiver's assistance.    Time  8    Period  Weeks    Status  New    Target Date  06/18/18      PT LONG TERM GOAL #2   Title  Pt will improve BERG balance score by 6 points from baseline in order to indicate decreased fall risk.      Time  8    Period  Weeks    Status  New    Target Date  06/18/18      PT LONG TERM GOAL #3   Title  Pt will perform TUG with RW in </= 35 secs in order to indicate decreased fall risk.      Baseline  43.84 secs with RW on 04-17-18    Time  8    Period  Weeks    Status  New    Target Date  06/18/18      PT LONG TERM GOAL #4   Title  Pt will perform 5x sit<>stand without UE support in </= 10 secs  in order to indicate improved functional strength.      Baseline  15.38 secs with LUE support on 04-17-18    Time  8    Period  Weeks    Status  New    Target Date  06/18/18      PT LONG TERM GOAL #5   Title  Pt will amb. 23' with RW with SBA on even surfaces to demo increased endurance/activity tolerance.    Baseline  115' with RW on 04-17-18    Time  8    Period  Weeks    Status  New    Target Date  06/18/18            Plan - 05/08/18 1238    Clinical Impression Statement  Skilled session focused on activities in which pt would be forced to shift weight onto LLE as well as making functional turns to the L with RW.  Pt requires up to mod A for standing balance tasks and max verbal cues when making L turns.      Rehab Potential  Fair    Clinical Impairments Affecting Rehab Potential  chronicity of deficits and length of time since onset:  pt has had several OP PT admissions/course of  therapy at this facility    PT Frequency  2x / week    PT Duration  8 weeks    PT Treatment/Interventions  ADLs/Self Care Home Management;Functional mobility training;Stair training;Gait training;DME Instruction;Therapeutic activities;Therapeutic exercise;Balance training;Neuromuscular re-education;Patient/family education    PT Next Visit Plan   cont gait training with RW for distance/quality, L lateral weight shift.     PT Home Exercise  Plan  standing balance exercises - pt may benefit from some PWR! exercises    Consulted and Agree with Plan of Care  Patient;Other (Comment)       Patient will benefit from skilled therapeutic intervention in order to improve the following deficits and impairments:  Abnormal gait, Decreased activity tolerance, Decreased balance, Decreased cognition, Decreased coordination, Decreased mobility, Decreased knowledge of use of DME, Decreased strength, Postural dysfunction, Pain  Visit Diagnosis: Other abnormalities of gait and mobility  Unsteadiness on feet  Muscle weakness (generalized)     Problem List Patient Active Problem List   Diagnosis Date Noted  . Weakness 10/04/2017  . Acute encephalopathy 10/04/2017  . Acute lower UTI 10/04/2017  . Aortic atherosclerosis (HCC) 10/04/2017  . Grief 09/23/2017  . Osteoporosis 04/16/2017  . Irregular heart beat 11/15/2016  . Hyperglycemia   . Oral thrush 07/18/2016  . Odynophagia 07/18/2016  . Anoxic brain injury (HCC) 12/29/2015  . Gait disorder 12/28/2015  . Well adult exam 12/08/2014  . Fracture of left humerus 06/24/2014  . Humerus fracture 06/24/2014  . Left elbow pain 11/03/2013  . Ankle ulcer (HCC) 04/02/2012  . Decubitus ulcer of coccyx 05/21/2011  . B12 deficiency 04/04/2010  . HYPERLIPIDEMIA 04/04/2010  . Anxiety state 11/14/2009  . Vitamin D deficiency 10/21/2009  . DM2 (diabetes mellitus, type 2) (HCC) 07/07/2009  . OSTEOARTHRITIS 02/25/2009  . LOW BACK PAIN 02/25/2009  . TOBACCO USER  01/13/2009  . DEPRESSION 02/26/2007  . PAIN, CHRONIC NEC 02/26/2007  . Venous (peripheral) insufficiency 02/26/2007  . GERD 02/26/2007  . HERNIA, INCISIONAL 02/26/2007  . SHOULDER PAIN, LEFT 02/26/2007  . 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 INSOMNIA 02/26/2007  . Abnormality of gait 02/26/2007  . LEG EDEMA, CHRONIC 02/26/2007    Harriet Butte, PT, MPT [facility] [address] [place], [place], [address] Phone: [phone]   Fax:  [phone] 05/08/18, 12:41 PM  Name: DAVIAN WOLLENBERG MRN: [id number] Date of Birth: Mar 22, 1951

## 2018-05-09 DIAGNOSIS — M25512 Pain in left shoulder: Secondary | ICD-10-CM | POA: Diagnosis not present

## 2018-05-09 DIAGNOSIS — G894 Chronic pain syndrome: Secondary | ICD-10-CM | POA: Diagnosis not present

## 2018-05-09 DIAGNOSIS — Z79891 Long term (current) use of opiate analgesic: Secondary | ICD-10-CM | POA: Diagnosis not present

## 2018-05-09 DIAGNOSIS — M47816 Spondylosis without myelopathy or radiculopathy, lumbar region: Secondary | ICD-10-CM | POA: Diagnosis not present

## 2018-05-19 ENCOUNTER — Ambulatory Visit: Payer: Medicare Other | Admitting: Physical Therapy

## 2018-05-19 DIAGNOSIS — R2681 Unsteadiness on feet: Secondary | ICD-10-CM

## 2018-05-19 DIAGNOSIS — M6281 Muscle weakness (generalized): Secondary | ICD-10-CM | POA: Diagnosis not present

## 2018-05-19 DIAGNOSIS — R2689 Other abnormalities of gait and mobility: Secondary | ICD-10-CM

## 2018-05-20 ENCOUNTER — Encounter: Payer: Self-pay | Admitting: Physical Therapy

## 2018-05-20 ENCOUNTER — Ambulatory Visit: Payer: Medicare Other | Admitting: Physical Therapy

## 2018-05-20 DIAGNOSIS — R2689 Other abnormalities of gait and mobility: Secondary | ICD-10-CM

## 2018-05-20 DIAGNOSIS — M6281 Muscle weakness (generalized): Secondary | ICD-10-CM | POA: Diagnosis not present

## 2018-05-20 DIAGNOSIS — R2681 Unsteadiness on feet: Secondary | ICD-10-CM

## 2018-05-20 NOTE — Therapy (Signed)
[facility] [address] [place], [place], [address] Phone: [phone]   Fax:  [phone]  Physical Therapy Treatment  Patient Details  Name: [name] MRN: [id number] Date of Birth: [birth year] Referring Provider: Dr. Jacinta ShoeAleksei Plotnikov   Encounter Date: 05/19/2018  PT End of Session - 05/20/18 1002    Visit Number  4    Number of Visits  17    Date for PT Re-Evaluation  06/18/18    Authorization Type  Medicare and Medicaid    Authorization Time Period  04-17-18 - 07-16-18    PT Start Time  1404    PT Stop Time  1447    PT Time Calculation (min)  43 min       Past Medical History:  Diagnosis Date  . Anoxic brain injury (HCC)   . Anxiety   . Aortic atherosclerosis (HCC) 10/04/2017  . Cellulitis and abscess of other specified site 10/11/2010   Qualifier: Diagnosis of  By: Plotnikov MD, Georgina QuintAleksei V   . Chronic neck pain   . Chronic pain in shoulder   . COPD (chronic obstructive pulmonary disease) (HCC)   . Depression   . Fracture of left humerus 06/24/2014  . Gait disorder   . GERD (gastroesophageal reflux disease)   . Hernia of abdominal cavity   . 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 Hyperlipidemia   . LBP (low back pain)   . Neuropathy   . Osteoarthritis   . Seizure disorder (HCC)   . Seizures (HCC)   . Type II or unspecified type diabetes mellitus without mention of complication, not stated as uncontrolled 2010  . UTI (urinary tract infection) 10/2017  . Vitamin B 12 deficiency 2011    Past Surgical History:  Procedure Laterality Date  . COLONOSCOPY    . DIAGNOSTIC LAPAROSCOPY     PMH: Exploratory lap  . HERNIA REPAIR     incisional  . ORIF HUMERUS FRACTURE Left 06/24/2014   Procedure: LEFT OPEN REDUCTION INTERNAL FIXATION (ORIF) PROXIMAL HUMERUS FRACTURE;  Surgeon: Eulas PostJoshua P Landau, MD;  Location: [facility] OR;  Service: Orthopedics;  Laterality: Left;  .  PANCREATECTOMY    . TONSILLECTOMY    . TUBAL LIGATION      There were no vitals filed for this visit.  Subjective Assessment - 05/20/18 0954    Subjective  Pt's CNA states pt has difficulty transferring from recliner into wheelchair at home because she turns wheelchair around backwards after transferring out of wheelchair into recliner; pt states she does not know why she does this    Patient is accompained by:  --   CNA - Terry   Patient Stated Goals  "I need to walk again"                       [facility] Treatment/Exercise - 05/20/18 0001      Transfers   Transfers  Sit to Stand    Sit to Stand  5: Supervision    Number of Reps  Other reps (comment)   3     Ambulation/Gait   Ambulation/Gait  Yes    Ambulation/Gait Assistance  4: Min guard    Ambulation/Gait Assistance Details  Cues to stay inside RW and to attempt to stand erect as able; pt continues to stand with increased weight shift onto Rt side    Ambulation Distance (Feet)  60 Feet   x 2  reps; pt amb. from back of gym to restroom in front   Assistive device  Rolling walker    Gait Pattern  Decreased step length - right;Decreased step length - left;Ataxic;Trunk rotated posteriorly on right;Decreased trunk rotation    Ambulation Surface  Level;Indoor    Gait Comments  Pt requested to amb. to bathroom at beginning of session; pt gait trained from far back mat table to restroom up front - approx. 12' ; assisted pt with turning from toilet to sink by instructing with cues to stay close inside RW and to turn RW in direction in which she is turning      NeuroRe-ed:  Pt performed standing unsupported for approx. 5 secs with chair in front - SBA provided; Pt performed marching in place 5 reps each leg with bil. UE support on back of chair Pt performed stepping up/back 5 reps RLE, then 5 reps LLE with bil. UE support  Pt performed sit to stand 3 reps without UE support from mat with SBA          PT  Short Term Goals - 04/18/18 1415      PT SHORT TERM GOAL #1   Title  Pt will perform HEP for standing balance with caregiver's assistance.    Time  4    Period  Weeks    Status  New    Target Date  05/18/18      PT SHORT TERM GOAL #2   Title  Will assess BERG and improve score by 3 points in order to indicate decreased fall risk.      Time  4    Period  Weeks    Status  New    Target Date  05/18/18      PT SHORT TERM GOAL #3   Title  Pt will improve 5TSS to <12 seconds with single UE support in order to indicate improved functional strength.      Baseline  15.38 secs with 1 UE support on 04-17-18    Time  4    Period  Weeks    Status  New    Target Date  05/18/18      PT SHORT TERM GOAL #4   Title  Pt will improve TUG to <39 secs in order to indicate decreased fall risk.      Baseline  43.84 secs with RW on 04-17-18    Time  4    Period  Weeks    Status  New    Target Date  05/18/18      PT SHORT TERM GOAL #5   Title  Pt will ambulate x 230' with RW with supervision, including turning around curve on track, to demonstrate improved endurance and balance with ambulation.    Baseline  115' with min assist with curve negotiation on track - 04-17-18    Time  4    Period  Weeks    Status  New    Target Date  05/18/18        PT Long Term Goals - 04/18/18 1420      PT LONG TERM GOAL #1   Title  Pt will perform updated HEP for balance and functional strengthening with caregiver's assistance.    Time  8    Period  Weeks    Status  New    Target Date  06/18/18      PT LONG TERM GOAL #2   Title  Pt will improve BERG balance score  by 6 points from baseline in order to indicate decreased fall risk.      Time  8    Period  Weeks    Status  New    Target Date  06/18/18      PT LONG TERM GOAL #3   Title  Pt will perform TUG with RW in </= 35 secs in order to indicate decreased fall risk.      Baseline  43.84 secs with RW on 04-17-18    Time  8    Period  Weeks    Status   New    Target Date  06/18/18      PT LONG TERM GOAL #4   Title  Pt will perform 5x sit<>stand without UE support in </= 10 secs  in order to indicate improved functional strength.      Baseline  15.38 secs with LUE support on 04-17-18    Time  8    Period  Weeks    Status  New    Target Date  06/18/18      PT LONG TERM GOAL #5   Title  Pt will amb. 30' with RW with SBA on even surfaces to demo increased endurance/activity tolerance.    Baseline  115' with RW on 04-17-18    Time  8    Period  Weeks    Status  New    Target Date  06/18/18            Plan - 05/20/18 1003    Clinical Impression Statement  Pt continues to exhibit posture and gait deviations with increased weight shift onto RLE and signficantly forward flexed posture. Pt has difficulty with turns, requiring verbal cues for positioning feet inside RW and for turning RW in direction in which she is going.      Rehab Potential  Fair    PT Frequency  2x / week    PT Duration  8 weeks    PT Treatment/Interventions  ADLs/Self Care Home Management;Functional mobility training;Stair training;Gait training;DME Instruction;Therapeutic activities;Therapeutic exercise;Balance training;Neuromuscular re-education;Patient/family education    PT Next Visit Plan   cont gait training with RW for distance/quality, L lateral weight shift.     PT Home Exercise Plan  standing balance exercises - pt may benefit from some PWR! exercises    Consulted and Agree with Plan of Care  Patient;Other (Comment)       Patient will benefit from skilled therapeutic intervention in order to improve the following deficits and impairments:  Abnormal gait, Decreased activity tolerance, Decreased balance, Decreased cognition, Decreased coordination, Decreased mobility, Decreased knowledge of use of DME, Decreased strength, Postural dysfunction, Pain  Visit Diagnosis: Other abnormalities of gait and mobility  Unsteadiness on feet     Problem  List Patient Active Problem List   Diagnosis Date Noted  . Weakness 10/04/2017  . Acute encephalopathy 10/04/2017  . Acute lower UTI 10/04/2017  . Aortic atherosclerosis (HCC) 10/04/2017  . Grief 09/23/2017  . Osteoporosis 04/16/2017  . Irregular heart beat 11/15/2016  . Hyperglycemia   . Oral thrush 07/18/2016  . Odynophagia 07/18/2016  . Anoxic brain injury (HCC) 12/29/2015  . Gait disorder 12/28/2015  . Well adult exam 12/08/2014  . Fracture of left humerus 06/24/2014  . Humerus fracture 06/24/2014  . Left elbow pain 11/03/2013  . Ankle ulcer (HCC) 04/02/2012  . Decubitus ulcer of coccyx 05/21/2011  . B12 deficiency 04/04/2010  . HYPERLIPIDEMIA 04/04/2010  . Anxiety state 11/14/2009  .  Vitamin D deficiency 10/21/2009  . DM2 (diabetes mellitus, type 2) (HCC) 07/07/2009  . OSTEOARTHRITIS 02/25/2009  . LOW BACK PAIN 02/25/2009  . TOBACCO USER 01/13/2009  . DEPRESSION 02/26/2007  . PAIN, CHRONIC NEC 02/26/2007  . Venous (peripheral) insufficiency 02/26/2007  . GERD 02/26/2007  . HERNIA, INCISIONAL 02/26/2007  . SHOULDER PAIN, LEFT 02/26/2007  . 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 INSOMNIA 02/26/2007  . Abnormality of gait 02/26/2007  . LEG EDEMA, CHRONIC 02/26/2007    Kary Kosilday, Jillann Charette Suzanne, PT 05/20/2018, 10:08 AM  [facility] [address] [place], [place], [address] Phone: [phone]   Fax:  [phone]  Name: Janet Fraiseanci D Bernier MRN: [id number] Date of Birth: Nov 12, 1950

## 2018-05-21 ENCOUNTER — Encounter: Payer: Self-pay | Admitting: Physical Therapy

## 2018-05-21 NOTE — Therapy (Signed)
[name] [address] [place], [place], [address] Phone: [phone]   Fax:  [phone]  Physical Therapy Treatment  Patient Details  Name: [name] MRN: [id number] Date of Birth: 30-Jan-1951 Referring Provider: Dr. Jacinta Shoe   Encounter Date: 05/20/2018  PT End of Session - 05/21/18 2014    Visit Number  5    Number of Visits  17    Date for PT Re-Evaluation  06/18/18    Authorization Type  Medicare and Medicaid    Authorization Time Period  04-17-18 - 07-16-18    PT Start Time  1402    PT Stop Time  1446    PT Time Calculation (min)  44 min       Past Medical History:  Diagnosis Date  . Anoxic brain injury (HCC)   . Anxiety   . Aortic atherosclerosis (HCC) 10/04/2017  . Cellulitis and abscess of other specified site 10/11/2010   Qualifier: Diagnosis of  By: Plotnikov MD, Georgina Quint   . Chronic neck pain   . Chronic pain in shoulder   . COPD (chronic obstructive pulmonary disease) (HCC)   . Depression   . Fracture of left humerus 06/24/2014  . Gait disorder   . GERD (gastroesophageal reflux disease)   . Hernia of abdominal cavity   . 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 Hyperlipidemia   . LBP (low back pain)   . Neuropathy   . Osteoarthritis   . Seizure disorder (HCC)   . Seizures (HCC)   . Type II or unspecified type diabetes mellitus without mention of complication, not stated as uncontrolled 2010  . UTI (urinary tract infection) 10/2017  . Vitamin B 12 deficiency 2011    Past Surgical History:  Procedure Laterality Date  . COLONOSCOPY    . DIAGNOSTIC LAPAROSCOPY     PMH: Exploratory lap  . HERNIA REPAIR     incisional  . ORIF HUMERUS FRACTURE Left 06/24/2014   Procedure: LEFT OPEN REDUCTION INTERNAL FIXATION (ORIF) PROXIMAL HUMERUS FRACTURE;  Surgeon: Eulas Post, MD;  Location: [facility] OR;  Service: Orthopedics;  Laterality: Left;  .  PANCREATECTOMY    . TONSILLECTOMY    . TUBAL LIGATION      There were no vitals filed for this visit.  Subjective Assessment - 05/21/18 2005    Subjective  Pt states her shoulders are sore today - probably from pushing down on walker with walking in PT yesterday; pt states transfers at home are going better     Patient is accompained by:  --   CNA - Terry   Patient Stated Goals  "I need to walk again"    Currently in Pain?  Yes    Pain Score  6     Pain Location  Shoulder    Pain Orientation  Right;Left    Pain Descriptors / Indicators  Aching;Sore    Pain Type  Acute pain    Pain Onset  Today    Pain Frequency  Constant                       [facility] Adult PT Treatment/Exercise - 05/21/18 0001      Transfers   Transfers  Sit to Stand    Sit to Stand  5: Supervision    Number of Reps  Other reps (comment)   3     Ambulation/Gait   Ambulation/Gait  Yes  Ambulation/Gait Assistance  4: Min guard;4: Min assist   min assist with turns/curves   Ambulation/Gait Assistance Details  cues to stay inside RW    Ambulation Distance (Feet)  105 Feet   from back mat to ladies' restroom in front   Assistive device  Rolling walker    Gait Pattern  Decreased step length - right;Decreased step length - left;Ataxic;Trunk rotated posteriorly on right;Decreased trunk rotation    Ambulation Surface  Level;Indoor      Knee/Hip Exercises: Aerobic   Stepper  SciFit level 2.0 x 5"       Gait training - 105' x 2 reps - from mat to restroom in front of clinic; pt then amb. From restroom back to mat table in back of gym-  SBA to CGA to min assist with turning    Balance Exercises - 05/21/18 2012      Balance Exercises: Standing   Standing Eyes Opened  Wide (BOA);Solid surface;2 reps;10 secs    Stepping Strategy  Anterior;Lateral;5 reps;UE support    Other Standing Exercises  Marching in place with bil. UE support on //bars - 5 reps each leg; tap ups onto balance beam with bil.  UE support; stepping over and back 5 reps with UE support           PT Short Term Goals - 05/20/18 1414      PT SHORT TERM GOAL #1   Title  Pt will perform HEP for standing balance with caregiver's assistance.    Status  Achieved      PT SHORT TERM GOAL #2   Title  Will assess BERG and improve score by 3 points in order to indicate decreased fall risk.        PT SHORT TERM GOAL #3   Title  Pt will improve 5TSS to <12 seconds with single UE support in order to indicate improved functional strength.      Baseline  15.38 secs with 1 UE support on 04-17-18;  14.57 secs       PT SHORT TERM GOAL #4   Baseline  43.84 secs with RW on 04-17-18; 42.06        PT Long Term Goals - 04/18/18 1420      PT LONG TERM GOAL #1   Title  Pt will perform updated HEP for balance and functional strengthening with caregiver's assistance.    Time  8    Period  Weeks    Status  New    Target Date  06/18/18      PT LONG TERM GOAL #2   Title  Pt will improve BERG balance score by 6 points from baseline in order to indicate decreased fall risk.      Time  8    Period  Weeks    Status  New    Target Date  06/18/18      PT LONG TERM GOAL #3   Title  Pt will perform TUG with RW in </= 35 secs in order to indicate decreased fall risk.      Baseline  43.84 secs with RW on 04-17-18    Time  8    Period  Weeks    Status  New    Target Date  06/18/18      PT LONG TERM GOAL #4   Title  Pt will perform 5x sit<>stand without UE support in </= 10 secs  in order to indicate improved functional strength.  Baseline  15.38 secs with LUE support on 04-17-18    Time  8    Period  Weeks    Status  New    Target Date  06/18/18      PT LONG TERM GOAL #5   Title  Pt will amb. 24' with RW with SBA on even surfaces to demo increased endurance/activity tolerance.    Baseline  115' with RW on 04-17-18    Time  8    Period  Weeks    Status  New    Target Date  06/18/18            Plan - 05/21/18 2015     Clinical Impression Statement  Pt has more difficulty with turning toward left side than toward right side; pt continues to need cues to stay inside RW and to turn RW in direction in which she is headed in preparation for turning     Rehab Potential  Fair    Clinical Impairments Affecting Rehab Potential  chronicity of deficits and length of time since onset:  pt has had several OP PT admissions/course of therapy at this facility    PT Frequency  2x / week    PT Duration  8 weeks    PT Treatment/Interventions  ADLs/Self Care Home Management;Functional mobility training;Stair training;Gait training;DME Instruction;Therapeutic activities;Therapeutic exercise;Balance training;Neuromuscular re-education;Patient/family education    PT Next Visit Plan   cont gait training with RW for distance/quality, L lateral weight shift.     PT Home Exercise Plan  standing balance exercises - pt may benefit from some PWR! exercises    Consulted and Agree with Plan of Care  Patient;Other (Comment)       Patient will benefit from skilled therapeutic intervention in order to improve the following deficits and impairments:  Abnormal gait, Decreased activity tolerance, Decreased balance, Decreased cognition, Decreased coordination, Decreased mobility, Decreased knowledge of use of DME, Decreased strength, Postural dysfunction, Pain  Visit Diagnosis: Other abnormalities of gait and mobility  Unsteadiness on feet     Problem List Patient Active Problem List   Diagnosis Date Noted  . Weakness 10/04/2017  . Acute encephalopathy 10/04/2017  . Acute lower UTI 10/04/2017  . Aortic atherosclerosis (HCC) 10/04/2017  . Grief 09/23/2017  . Osteoporosis 04/16/2017  . Irregular heart beat 11/15/2016  . Hyperglycemia   . Oral thrush 07/18/2016  . Odynophagia 07/18/2016  . Anoxic brain injury (HCC) 12/29/2015  . Gait disorder 12/28/2015  . Well adult exam 12/08/2014  . Fracture of left humerus 06/24/2014  .  Humerus fracture 06/24/2014  . Left elbow pain 11/03/2013  . Ankle ulcer (HCC) 04/02/2012  . Decubitus ulcer of coccyx 05/21/2011  . B12 deficiency 04/04/2010  . HYPERLIPIDEMIA 04/04/2010  . Anxiety state 11/14/2009  . Vitamin D deficiency 10/21/2009  . DM2 (diabetes mellitus, type 2) (HCC) 07/07/2009  . OSTEOARTHRITIS 02/25/2009  . LOW BACK PAIN 02/25/2009  . TOBACCO USER 01/13/2009  . DEPRESSION 02/26/2007  . PAIN, CHRONIC NEC 02/26/2007  . Venous (peripheral) insufficiency 02/26/2007  . GERD 02/26/2007  . HERNIA, INCISIONAL 02/26/2007  . SHOULDER PAIN, LEFT 02/26/2007  . 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 INSOMNIA 02/26/2007  . Abnormality of gait 02/26/2007  . LEG EDEMA, CHRONIC 02/26/2007    Kary Kos, PT 05/21/2018, 8:20 PM  [facility] [address] [place], [place], [address] Phone: [phone]   Fax:  [phone]  Name: [name]  [name] MRN: [id number] Date of Birth: 1951-06-08

## 2018-05-26 ENCOUNTER — Ambulatory Visit: Payer: Medicare Other | Admitting: Physical Therapy

## 2018-05-26 ENCOUNTER — Other Ambulatory Visit: Payer: Self-pay | Admitting: Internal Medicine

## 2018-05-26 DIAGNOSIS — M6281 Muscle weakness (generalized): Secondary | ICD-10-CM | POA: Diagnosis not present

## 2018-05-26 DIAGNOSIS — R2681 Unsteadiness on feet: Secondary | ICD-10-CM | POA: Diagnosis not present

## 2018-05-26 DIAGNOSIS — R2689 Other abnormalities of gait and mobility: Secondary | ICD-10-CM

## 2018-05-27 ENCOUNTER — Encounter: Payer: Self-pay | Admitting: Physical Therapy

## 2018-05-27 NOTE — Therapy (Signed)
[facility] [address] [place], [place], [address] Phone: [phone]   Fax:  [phone]  Physical Therapy Treatment  Patient Details  Name: [name] MRN: [id number] Date of Birth: Feb 13, 1951 Referring Provider: Dr. Lew Dawes   Encounter Date: 05/26/2018  PT End of Session - 05/27/18 2103    Visit Number  6    Number of Visits  17    Date for PT Re-Evaluation  06/18/18    Authorization Type  Medicare and Medicaid    Authorization Time Period  04-17-18 - 07-16-18    PT Start Time  1402    PT Stop Time  1445    PT Time Calculation (min)  43 min    Equipment Utilized During Treatment  Gait belt       Past Medical History:  Diagnosis Date  . Anoxic brain injury ([place])   . Anxiety   . Aortic atherosclerosis ([place]) 10/04/2017  . Cellulitis and abscess of other specified site 10/11/2010   Qualifier: Diagnosis of  By: Plotnikov MD, Evie Lacks   . Chronic neck pain   . Chronic pain in shoulder   . COPD (chronic obstructive pulmonary disease) ([place])   . Depression   . Fracture of left humerus 06/24/2014  . Gait disorder   . GERD (gastroesophageal reflux disease)   . Hernia of abdominal cavity   . 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 Hyperlipidemia   . LBP (low back pain)   . Neuropathy   . Osteoarthritis   . Seizure disorder ([place])   . Seizures ([place])   . Type II or unspecified type diabetes mellitus without mention of complication, not stated as uncontrolled 2010  . UTI (urinary tract infection) 10/2017  . Vitamin B 12 deficiency 2011    Past Surgical History:  Procedure Laterality Date  . COLONOSCOPY    . DIAGNOSTIC LAPAROSCOPY     PMH: Exploratory lap  . HERNIA REPAIR     incisional  . ORIF HUMERUS FRACTURE Left 06/24/2014   Procedure: LEFT OPEN REDUCTION INTERNAL FIXATION (ORIF) PROXIMAL HUMERUS FRACTURE;  Surgeon: Johnny Bridge, MD;  Location: [facility];   Service: Orthopedics;  Laterality: Left;  . PANCREATECTOMY    . TONSILLECTOMY    . TUBAL LIGATION      There were no vitals filed for this visit.  Subjective Assessment - 05/27/18 2059    Subjective  Pt states the soreness in her shoulders went away - is doing fine today    Patient Stated Goals  "I need to walk again"    Currently in Pain?  No/denies                       [facility] Adult PT Treatment/Exercise - 05/27/18 0001      Transfers   Transfers  Sit to Stand    Sit to Stand  5: Supervision    Number of Reps  Other reps (comment)   3     Ambulation/Gait   Ambulation/Gait  Yes    Ambulation/Gait Assistance  4: Min guard    Ambulation/Gait Assistance Details  cues to stay inside RW and to stand erect    Ambulation Distance (Feet)  115 Feet    Assistive device  Rolling walker    Gait Pattern  Decreased step length - right;Decreased step length - left;Ataxic;Trunk rotated posteriorly on right;Decreased trunk rotation    Ambulation Surface  Level;Indoor  Balance Exercises - 05/27/18 2102      Balance Exercises: Standing   Standing Eyes Opened  Wide ([place]);Solid surface;2 reps;10 secs    Other Standing Exercises  Marching in place with bil. UE support on //bars - 5 reps each leg; tap ups onto balance beam with bil. UE support; stepping over and back 5 reps with UE support           PT Short Term Goals - 05/27/18 2103      PT SHORT TERM GOAL #1   Title  Pt will perform HEP for standing balance with caregiver's assistance.    Status  Achieved      PT SHORT TERM GOAL #2   Title  Will assess BERG and improve score by 3 points in order to indicate decreased fall risk.      Status  On-going      PT SHORT TERM GOAL #3   Title  Pt will improve 5TSS to <12 seconds with single UE support in order to indicate improved functional strength.      Baseline  15.38 secs with 1 UE support on 04-17-18;  14.57 secs on 05-20-18    Status  Not Met      PT  SHORT TERM GOAL #4   Title  Pt will improve TUG to <39 secs in order to indicate decreased fall risk.      Baseline  43.84 secs with RW on 04-17-18; 42.06 secs with RW on 05-20-18    Status  Not Met      PT SHORT TERM GOAL #5   Title  Pt will ambulate x 230' with RW with supervision, including turning around curve on track, to demonstrate improved endurance and balance with ambulation.    Baseline  115' with RW    Status  Not Met        PT Long Term Goals - 04/18/18 1420      PT LONG TERM GOAL #1   Title  Pt will perform updated HEP for balance and functional strengthening with caregiver's assistance.    Time  8    Period  Weeks    Status  New    Target Date  06/18/18      PT LONG TERM GOAL #2   Title  Pt will improve BERG balance score by 6 points from baseline in order to indicate decreased fall risk.      Time  8    Period  Weeks    Status  New    Target Date  06/18/18      PT LONG TERM GOAL #3   Title  Pt will perform TUG with RW in </= 35 secs in order to indicate decreased fall risk.      Baseline  43.84 secs with RW on 04-17-18    Time  8    Period  Weeks    Status  New    Target Date  06/18/18      PT LONG TERM GOAL #4   Title  Pt will perform 5x sit<>stand without UE support in </= 10 secs  in order to indicate improved functional strength.      Baseline  15.38 secs with LUE support on 04-17-18    Time  8    Period  Weeks    Status  New    Target Date  06/18/18      PT LONG TERM GOAL #5   Title  Pt will amb. 15' with  RW with SBA on even surfaces to demo increased endurance/activity tolerance.    Baseline  115' with RW on 04-17-18    Time  8    Period  Weeks    Status  New    Target Date  06/18/18              Patient will benefit from skilled therapeutic intervention in order to improve the following deficits and impairments:     Visit Diagnosis: Other abnormalities of gait and mobility  Unsteadiness on feet     Problem List Patient Active  Problem List   Diagnosis Date Noted  . Weakness 10/04/2017  . Acute encephalopathy 10/04/2017  . Acute lower UTI 10/04/2017  . Aortic atherosclerosis ([place]) 10/04/2017  . Grief 09/23/2017  . Osteoporosis 04/16/2017  . Irregular heart beat 11/15/2016  . Hyperglycemia   . Oral thrush 07/18/2016  . Odynophagia 07/18/2016  . Anoxic brain injury ([place]) 12/29/2015  . Gait disorder 12/28/2015  . Well adult exam 12/08/2014  . Fracture of left humerus 06/24/2014  . Humerus fracture 06/24/2014  . Left elbow pain 11/03/2013  . Ankle ulcer ([place]) 04/02/2012  . Decubitus ulcer of coccyx 05/21/2011  . B12 deficiency 04/04/2010  . HYPERLIPIDEMIA 04/04/2010  . Anxiety state 11/14/2009  . Vitamin D deficiency 10/21/2009  . DM2 (diabetes mellitus, type 2) ([place]) 07/07/2009  . OSTEOARTHRITIS 02/25/2009  . LOW BACK PAIN 02/25/2009  . TOBACCO USER 01/13/2009  . DEPRESSION 02/26/2007  . PAIN, CHRONIC NEC 02/26/2007  . Venous (peripheral) insufficiency 02/26/2007  . GERD 02/26/2007  . HERNIA, INCISIONAL 02/26/2007  . SHOULDER PAIN, LEFT 02/26/2007  . 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 INSOMNIA 02/26/2007  . Abnormality of gait 02/26/2007  . LEG EDEMA, CHRONIC 02/26/2007    Alda Lea, PT 05/27/2018, 9:11 PM  [facility] [address] [place], [place], [address] Phone: [phone]   Fax:  [phone]  Name: [name] MRN: [id number] Date of Birth: 14-Nov-1950

## 2018-05-29 ENCOUNTER — Ambulatory Visit: Payer: Medicare Other | Admitting: Physical Therapy

## 2018-05-29 ENCOUNTER — Encounter: Payer: Self-pay | Admitting: Physical Therapy

## 2018-05-29 DIAGNOSIS — R2681 Unsteadiness on feet: Secondary | ICD-10-CM

## 2018-05-29 DIAGNOSIS — M6281 Muscle weakness (generalized): Secondary | ICD-10-CM | POA: Diagnosis not present

## 2018-05-29 DIAGNOSIS — R2689 Other abnormalities of gait and mobility: Secondary | ICD-10-CM

## 2018-05-30 ENCOUNTER — Encounter: Payer: Self-pay | Admitting: Physical Therapy

## 2018-05-30 NOTE — Therapy (Signed)
[facility] [address] [place], [place], [address] Phone: [phone]   Fax:  [phone]  Physical Therapy Treatment  Patient Details  Name: [name] MRN: [id number] Date of Birth: [birth year] Referring Provider: Dr. Lew Dawes   Encounter Date: 05/29/2018  PT End of Session - 05/30/18 1414    Visit Number  7    Number of Visits  17    Date for PT Re-Evaluation  06/18/18    Authorization Type  Medicare and Medicaid    Authorization Time Period  04-17-18 - 07-16-18    PT Start Time  7858    PT Stop Time  1401    PT Time Calculation (min)  44 min       Past Medical History:  Diagnosis Date  . Anoxic brain injury ([place])   . Anxiety   . Aortic atherosclerosis ([place]) 10/04/2017  . Cellulitis and abscess of other specified site 10/11/2010   Qualifier: Diagnosis of  By: Plotnikov MD, Evie Lacks   . Chronic neck pain   . Chronic pain in shoulder   . COPD (chronic obstructive pulmonary disease) ([place])   . Depression   . Fracture of left humerus 06/24/2014  . Gait disorder   . GERD (gastroesophageal reflux disease)   . Hernia of abdominal cavity   . 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 Hyperlipidemia   . LBP (low back pain)   . Neuropathy   . Osteoarthritis   . Seizure disorder ([place])   . Seizures ([place])   . Type II or unspecified type diabetes mellitus without mention of complication, not stated as uncontrolled 2010  . UTI (urinary tract infection) 10/2017  . Vitamin B 12 deficiency 2011    Past Surgical History:  Procedure Laterality Date  . COLONOSCOPY    . DIAGNOSTIC LAPAROSCOPY     PMH: Exploratory lap  . HERNIA REPAIR     incisional  . ORIF HUMERUS FRACTURE Left 06/24/2014   Procedure: LEFT OPEN REDUCTION INTERNAL FIXATION (ORIF) PROXIMAL HUMERUS FRACTURE;  Surgeon: Johnny Bridge, MD;  Location: [facility];  Service: Orthopedics;  Laterality: Left;  .  PANCREATECTOMY    . TONSILLECTOMY    . TUBAL LIGATION      There were no vitals filed for this visit.  Subjective Assessment - 05/30/18 1411    Subjective  Pt reports no changes since previous PT session earlier this week; accompanied to PT by her CNA, [name]    Patient Stated Goals  "I need to walk again"    Currently in Pain?  No/denies                       [facility] Adult PT Treatment/Exercise - 05/30/18 0001      Transfers   Transfers  Sit to Stand    Sit to Stand  5: Supervision    Number of Reps  Other reps (comment)   3     Ambulation/Gait   Ambulation/Gait  Yes    Ambulation/Gait Assistance  4: Min guard    Ambulation/Gait Assistance Details  cues to stay close to RW and to stand erect    Ambulation Distance (Feet)  115 Feet    Assistive device  Rolling walker    Gait Pattern  Decreased step length - right;Decreased step length - left;Ataxic;Trunk rotated posteriorly on right;Decreased trunk rotation    Ambulation Surface  Level;Indoor  Knee/Hip Exercises: Aerobic   Stepper  SciFit level 2.0 x 3" with UE's and LE's        NeuroRe-ed:  Standing balance exercises performed inside // bars with UE support ; pt gait trained inside bars 2 reps With LUE support on bar Marching in place x 10 reps with bil. UE support Pt performed stepping over and back of red & black balance beam - 5 reps each leg with much difficulty with LLE with stepping back from Over beam  Attempted PWR!exercise with weight shifting and reaching up with ipsilateral arm on weight bearing side - pt unable to process and motor plan for this Exercise to be performed alternating/weight shifting onto each side; modified exercise to stay on same side - weight shift, then reach up with same arm-  Pt performed 5 reps each with UE support on // bar Sidestepping inside // bars -  2 reps with bil. UE support on // bar      PT Short Term Goals - 05/29/18 1335      PT SHORT TERM GOAL #1    Title  Pt will perform HEP for standing balance with caregiver's assistance.    Status  Achieved      PT SHORT TERM GOAL #2   Title  Will assess BERG and improve score by 3 points in order to indicate decreased fall risk.      Baseline  22/56 baseline and 30/56 on 01/24/17    Status  On-going      PT SHORT TERM GOAL #3   Title  Pt will improve 5TSS to <12 seconds with single UE support in order to indicate improved functional strength.      Baseline  15.38 secs with 1 UE support on 04-17-18;  14.57 secs on 05-20-18    Status  Not Met      PT SHORT TERM GOAL #4   Title  Pt will improve TUG to <39 secs in order to indicate decreased fall risk.      Baseline  43.84 secs with RW on 04-17-18; 42.06 secs with RW on 05-20-18    Status  Not Met      PT SHORT TERM GOAL #5   Title  Pt will ambulate x 230' with RW with supervision, including turning around curve on track, to demonstrate improved endurance and balance with ambulation.    Baseline  115' with RW    Status  Not Met        PT Long Term Goals - 04/18/18 1420      PT LONG TERM GOAL #1   Title  Pt will perform updated HEP for balance and functional strengthening with caregiver's assistance.    Time  8    Period  Weeks    Status  New    Target Date  06/18/18      PT LONG TERM GOAL #2   Title  Pt will improve BERG balance score by 6 points from baseline in order to indicate decreased fall risk.      Time  8    Period  Weeks    Status  New    Target Date  06/18/18      PT LONG TERM GOAL #3   Title  Pt will perform TUG with RW in </= 35 secs in order to indicate decreased fall risk.      Baseline  43.84 secs with RW on 04-17-18    Time  8    Period  Weeks    Status  New    Target Date  06/18/18      PT LONG TERM GOAL #4   Title  Pt will perform 5x sit<>stand without UE support in </= 10 secs  in order to indicate improved functional strength.      Baseline  15.38 secs with LUE support on 04-17-18    Time  8    Period  Weeks     Status  New    Target Date  06/18/18      PT LONG TERM GOAL #5   Title  Pt will amb. 39' with RW with SBA on even surfaces to demo increased endurance/activity tolerance.    Baseline  115' with RW on 04-17-18    Time  8    Period  Weeks    Status  New    Target Date  06/18/18            Plan - 05/30/18 1415    Clinical Impression Statement  Pt had significant difficulty with performing weight shifting activity with reaching up with UE (PWR! exercise); had difficulty with motor planning for this exercise.  Pt also has significant difficulty with standing on RLE and stepping with LLE, as in stepping over and back of balance beam.  Pt continues to require UE support with marching and other dynamic standing balance exercises.      Rehab Potential  Fair    Clinical Impairments Affecting Rehab Potential  chronicity of deficits and length of time since onset:  pt has had several OP PT admissions/course of therapy at this facility    PT Frequency  2x / week    PT Duration  8 weeks    PT Treatment/Interventions  ADLs/Self Care Home Management;Functional mobility training;Stair training;Gait training;DME Instruction;Therapeutic activities;Therapeutic exercise;Balance training;Neuromuscular re-education;Patient/family education    PT Next Visit Plan   cont gait training with RW for distance/quality, L lateral weight shift.     PT Home Exercise Plan  standing balance exercises - pt may benefit from some PWR! exercises    Consulted and Agree with Plan of Care  Patient;Other (Comment)       Patient will benefit from skilled therapeutic intervention in order to improve the following deficits and impairments:  Abnormal gait, Decreased activity tolerance, Decreased balance, Decreased cognition, Decreased coordination, Decreased mobility, Decreased knowledge of use of DME, Decreased strength, Postural dysfunction, Pain  Visit Diagnosis: Other abnormalities of gait and mobility  Unsteadiness on  feet  Muscle weakness (generalized)     Problem List Patient Active Problem List   Diagnosis Date Noted  . Weakness 10/04/2017  . Acute encephalopathy 10/04/2017  . Acute lower UTI 10/04/2017  . Aortic atherosclerosis ([place]) 10/04/2017  . Grief 09/23/2017  . Osteoporosis 04/16/2017  . Irregular heart beat 11/15/2016  . Hyperglycemia   . Oral thrush 07/18/2016  . Odynophagia 07/18/2016  . Anoxic brain injury ([place]) 12/29/2015  . Gait disorder 12/28/2015  . Well adult exam 12/08/2014  . Fracture of left humerus 06/24/2014  . Humerus fracture 06/24/2014  . Left elbow pain 11/03/2013  . Ankle ulcer ([place]) 04/02/2012  . Decubitus ulcer of coccyx 05/21/2011  . B12 deficiency 04/04/2010  . HYPERLIPIDEMIA 04/04/2010  . Anxiety state 11/14/2009  . Vitamin D deficiency 10/21/2009  . DM2 (diabetes mellitus, type 2) ([place]) 07/07/2009  . OSTEOARTHRITIS 02/25/2009  . LOW BACK PAIN 02/25/2009  . TOBACCO USER 01/13/2009  . DEPRESSION 02/26/2007  . PAIN, CHRONIC NEC 02/26/2007  .  Venous (peripheral) insufficiency 02/26/2007  . GERD 02/26/2007  . HERNIA, INCISIONAL 02/26/2007  . SHOULDER PAIN, LEFT 02/26/2007  . 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 INSOMNIA 02/26/2007  . Abnormality of gait 02/26/2007  . LEG EDEMA, CHRONIC 02/26/2007    DildayJenness Corner, PT 05/30/2018, 2:21 PM  [facility] [address] [place], [place], [address] Phone: [phone]   Fax:  [phone]  Name: [name] MRN: [id number] Date of Birth: 1951-03-17

## 2018-06-03 ENCOUNTER — Ambulatory Visit: Payer: Medicare Other | Attending: Internal Medicine | Admitting: Physical Therapy

## 2018-06-03 DIAGNOSIS — R2689 Other abnormalities of gait and mobility: Secondary | ICD-10-CM | POA: Diagnosis not present

## 2018-06-03 DIAGNOSIS — R2681 Unsteadiness on feet: Secondary | ICD-10-CM | POA: Diagnosis not present

## 2018-06-03 DIAGNOSIS — M6281 Muscle weakness (generalized): Secondary | ICD-10-CM | POA: Diagnosis not present

## 2018-06-04 ENCOUNTER — Encounter: Payer: Self-pay | Admitting: Physical Therapy

## 2018-06-04 NOTE — Therapy (Signed)
[facility] [address] [place], [place], [address] Phone: [phone]   Fax:  [phone]  Physical Therapy Treatment  Patient Details  Name: [name] MRN: [id number] Date of Birth: May 30, 1951 Referring Provider: Dr. Lew Dawes   Encounter Date: 06/03/2018  PT End of Session - 06/04/18 1004    Visit Number  8    Number of Visits  17    Date for PT Re-Evaluation  06/18/18    Authorization Type  Medicare and Medicaid    Authorization Time Period  04-17-18 - 07-16-18    PT Start Time  1319    PT Stop Time  1403    PT Time Calculation (min)  44 min       Past Medical History:  Diagnosis Date  . Anoxic brain injury ([place])   . Anxiety   . Aortic atherosclerosis ([place]) 10/04/2017  . Cellulitis and abscess of other specified site 10/11/2010   Qualifier: Diagnosis of  By: Plotnikov MD, Evie Lacks   . Chronic neck pain   . Chronic pain in shoulder   . COPD (chronic obstructive pulmonary disease) ([place])   . Depression   . Fracture of left humerus 06/24/2014  . Gait disorder   . GERD (gastroesophageal reflux disease)   . Hernia of abdominal cavity   . 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 Hyperlipidemia   . LBP (low back pain)   . Neuropathy   . Osteoarthritis   . Seizure disorder ([place])   . Seizures ([place])   . Type II or unspecified type diabetes mellitus without mention of complication, not stated as uncontrolled 2010  . UTI (urinary tract infection) 10/2017  . Vitamin B 12 deficiency 2011    Past Surgical History:  Procedure Laterality Date  . COLONOSCOPY    . DIAGNOSTIC LAPAROSCOPY     PMH: Exploratory lap  . HERNIA REPAIR     incisional  . ORIF HUMERUS FRACTURE Left 06/24/2014   Procedure: LEFT OPEN REDUCTION INTERNAL FIXATION (ORIF) PROXIMAL HUMERUS FRACTURE;  Surgeon: Johnny Bridge, MD;  Location: [facility];  Service: Orthopedics;  Laterality: Left;  .  PANCREATECTOMY    . TONSILLECTOMY    . TUBAL LIGATION      There were no vitals filed for this visit.  Subjective Assessment - 06/04/18 0957    Subjective  Pt states she did too much in previous PT session - states her legs hurt so much that she was unable to sleep that night; does not want to overdo it today     Patient is accompained by:  --   CNA, Terry   Patient Stated Goals  "I need to walk again"    Currently in Pain?  Yes    Pain Score  6     Pain Location  --   generalized pain in back and legs   Pain Orientation  Lower;Right;Left    Pain Descriptors / Indicators  Aching;Sore    Pain Type  Chronic pain    Pain Onset  More than a month ago    Pain Frequency  Intermittent                       [facility] Adult PT Treatment/Exercise - 06/04/18 0001      Ambulation/Gait   Ambulation/Gait  Yes    Ambulation/Gait Assistance  4: Min guard    Ambulation/Gait Assistance Details  cues to stay  close inside RW    Ambulation Distance (Feet)  115 Feet    Assistive device  Rolling walker    Gait Pattern  Decreased step length - right;Decreased step length - left;Ataxic;Trunk rotated posteriorly on right;Decreased trunk rotation    Ambulation Surface  Level;Indoor    Stairs  Yes    Stairs Assistance  4: Min guard    Stair Management Technique  Two rails;Step to pattern;Forwards    Number of Stairs  4    Height of Stairs  6      Exercises   Exercises  Knee/Hip      Knee/Hip Exercises: Aerobic   Stepper  SciFit level 1.5 x 3" with UE's and LE's           Balance Exercises - 06/04/18 1001      Balance Exercises: Standing   Stepping Strategy  Anterior;Lateral;UE support;5 reps   on floor - RW in front of pt    Other Standing Exercises  stepping over and back of carpet/tile divider  with bil. UE supprot 5 reps eahc leg with min guard assist        Pt performed amb. Around 5 cones with use of RW to improve ability to turn with use of RW; cues needed for  positioning of RW and To stay close inside RW, as pt has tendency to push RW far head and increase trunk flexion to maintain UE support on RW   PT Short Term Goals - 05/29/18 1335      PT SHORT TERM GOAL #1   Title  Pt will perform HEP for standing balance with caregiver's assistance.    Status  Achieved      PT SHORT TERM GOAL #2   Title  Will assess BERG and improve score by 3 points in order to indicate decreased fall risk.      Baseline  22/56 baseline and 30/56 on 01/24/17    Status  On-going      PT SHORT TERM GOAL #3   Title  Pt will improve 5TSS to <12 seconds with single UE support in order to indicate improved functional strength.      Baseline  15.38 secs with 1 UE support on 04-17-18;  14.57 secs on 05-20-18    Status  Not Met      PT SHORT TERM GOAL #4   Title  Pt will improve TUG to <39 secs in order to indicate decreased fall risk.      Baseline  43.84 secs with RW on 04-17-18; 42.06 secs with RW on 05-20-18    Status  Not Met      PT SHORT TERM GOAL #5   Title  Pt will ambulate x 230' with RW with supervision, including turning around curve on track, to demonstrate improved endurance and balance with ambulation.    Baseline  115' with RW    Status  Not Met        PT Long Term Goals - 04/18/18 1420      PT LONG TERM GOAL #1   Title  Pt will perform updated HEP for balance and functional strengthening with caregiver's assistance.    Time  8    Period  Weeks    Status  New    Target Date  06/18/18      PT LONG TERM GOAL #2   Title  Pt will improve BERG balance score by 6 points from baseline in order to indicate decreased fall risk.  Time  8    Period  Weeks    Status  New    Target Date  06/18/18      PT LONG TERM GOAL #3   Title  Pt will perform TUG with RW in </= 35 secs in order to indicate decreased fall risk.      Baseline  43.84 secs with RW on 04-17-18    Time  8    Period  Weeks    Status  New    Target Date  06/18/18      PT LONG TERM GOAL  #4   Title  Pt will perform 5x sit<>stand without UE support in </= 10 secs  in order to indicate improved functional strength.      Baseline  15.38 secs with LUE support on 04-17-18    Time  8    Period  Weeks    Status  New    Target Date  06/18/18      PT LONG TERM GOAL #5   Title  Pt will amb. 69' with RW with SBA on even surfaces to demo increased endurance/activity tolerance.    Baseline  115' with RW on 04-17-18    Time  8    Period  Weeks    Status  New    Target Date  06/18/18            Plan - 06/04/18 1004    Clinical Impression Statement  Pt continues to have difficulty turning toward left side and needs frequent cues to stay inside RW, especially with turning in preparation for stand to sit transfer;  pt requires UE support with weight shifting and with SLS activities      Rehab Potential  Fair    Clinical Impairments Affecting Rehab Potential  chronicity of deficits and length of time since onset:  pt has had several OP PT admissions/course of therapy at this facility    PT Frequency  2x / week    PT Duration  8 weeks    PT Treatment/Interventions  ADLs/Self Care Home Management;Functional mobility training;Stair training;Gait training;DME Instruction;Therapeutic activities;Therapeutic exercise;Balance training;Neuromuscular re-education;Patient/family education    PT Next Visit Plan    Try reaching up to target on cabinet for weight shifting activity;  cont gait training with RW for distance/quality, L lateral weight shift.     PT Home Exercise Plan  standing balance exercises - pt may benefit from some PWR! exercises    Consulted and Agree with Plan of Care  Patient;Other (Comment)       Patient will benefit from skilled therapeutic intervention in order to improve the following deficits and impairments:  Abnormal gait, Decreased activity tolerance, Decreased balance, Decreased cognition, Decreased coordination, Decreased mobility, Decreased knowledge of use of DME,  Decreased strength, Postural dysfunction, Pain  Visit Diagnosis: Other abnormalities of gait and mobility  Unsteadiness on feet  Muscle weakness (generalized)     Problem List Patient Active Problem List   Diagnosis Date Noted  . Weakness 10/04/2017  . Acute encephalopathy 10/04/2017  . Acute lower UTI 10/04/2017  . Aortic atherosclerosis ([place]) 10/04/2017  . Grief 09/23/2017  . Osteoporosis 04/16/2017  . Irregular heart beat 11/15/2016  . Hyperglycemia   . Oral thrush 07/18/2016  . Odynophagia 07/18/2016  . Anoxic brain injury ([place]) 12/29/2015  . Gait disorder 12/28/2015  . Well adult exam 12/08/2014  . Fracture of left humerus 06/24/2014  . Humerus fracture 06/24/2014  . Left elbow pain 11/03/2013  .  Ankle ulcer ([place]) 04/02/2012  . Decubitus ulcer of coccyx 05/21/2011  . B12 deficiency 04/04/2010  . HYPERLIPIDEMIA 04/04/2010  . Anxiety state 11/14/2009  . Vitamin D deficiency 10/21/2009  . DM2 (diabetes mellitus, type 2) ([place]) 07/07/2009  . OSTEOARTHRITIS 02/25/2009  . LOW BACK PAIN 02/25/2009  . TOBACCO USER 01/13/2009  . DEPRESSION 02/26/2007  . PAIN, CHRONIC NEC 02/26/2007  . Venous (peripheral) insufficiency 02/26/2007  . GERD 02/26/2007  . HERNIA, INCISIONAL 02/26/2007  . SHOULDER PAIN, LEFT 02/26/2007  . 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 INSOMNIA 02/26/2007  . Abnormality of gait 02/26/2007  . LEG EDEMA, CHRONIC 02/26/2007    Alda Lea, PT 06/04/2018, 10:09 AM  [facility] [address] [place], [place], [address] Phone: [phone]   Fax:  [phone]  Name: [name] MRN: [id number] Date of Birth: [birth year]

## 2018-06-05 ENCOUNTER — Ambulatory Visit: Payer: Medicare Other | Admitting: Physical Therapy

## 2018-06-05 DIAGNOSIS — R2681 Unsteadiness on feet: Secondary | ICD-10-CM | POA: Diagnosis not present

## 2018-06-05 DIAGNOSIS — R2689 Other abnormalities of gait and mobility: Secondary | ICD-10-CM

## 2018-06-05 DIAGNOSIS — M6281 Muscle weakness (generalized): Secondary | ICD-10-CM | POA: Diagnosis not present

## 2018-06-06 ENCOUNTER — Encounter: Payer: Self-pay | Admitting: Physical Therapy

## 2018-06-06 NOTE — Therapy (Signed)
[facility] [address] [place], [place], [address] Phone: [phone]   Fax:  [phone]  Physical Therapy Treatment  Patient Details  Name: [name] MRN: [id number] Date of Birth: [birth year] Referring Provider: Dr. Lew Dawes   Encounter Date: 06/05/2018  PT End of Session - 06/06/18 1706    Visit Number  9    Number of Visits  17    Date for PT Re-Evaluation  06/18/18    Authorization Type  Medicare and Medicaid    Authorization Time Period  04-17-18 - 07-16-18    PT Start Time  1402    PT Stop Time  1446    PT Time Calculation (min)  44 min    Equipment Utilized During Treatment  Gait belt       Past Medical History:  Diagnosis Date  . Anoxic brain injury ([place])   . Anxiety   . Aortic atherosclerosis ([place]) 10/04/2017  . Cellulitis and abscess of other specified site 10/11/2010   Qualifier: Diagnosis of  By: Plotnikov MD, Evie Lacks   . Chronic neck pain   . Chronic pain in shoulder   . COPD (chronic obstructive pulmonary disease) ([place])   . Depression   . Fracture of left humerus 06/24/2014  . Gait disorder   . GERD (gastroesophageal reflux disease)   . Hernia of abdominal cavity   . 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 Hyperlipidemia   . LBP (low back pain)   . Neuropathy   . Osteoarthritis   . Seizure disorder ([place])   . Seizures ([place])   . Type II or unspecified type diabetes mellitus without mention of complication, not stated as uncontrolled 2010  . UTI (urinary tract infection) 10/2017  . Vitamin B 12 deficiency 2011    Past Surgical History:  Procedure Laterality Date  . COLONOSCOPY    . DIAGNOSTIC LAPAROSCOPY     PMH: Exploratory lap  . HERNIA REPAIR     incisional  . ORIF HUMERUS FRACTURE Left 06/24/2014   Procedure: LEFT OPEN REDUCTION INTERNAL FIXATION (ORIF) PROXIMAL HUMERUS FRACTURE;  Surgeon: Johnny Bridge, MD;  Location: [facility];   Service: Orthopedics;  Laterality: Left;  . PANCREATECTOMY    . TONSILLECTOMY    . TUBAL LIGATION      There were no vitals filed for this visit.  Subjective Assessment - 06/06/18 1655    Subjective  Pt states she is doing good today     Patient Stated Goals  "I need to walk again"    Currently in Pain?  Yes    Pain Score  5     Pain Location  --   generalized pain in back and legs   Pain Orientation  Right;Left    Pain Descriptors / Indicators  Aching;Sore    Pain Type  Chronic pain    Pain Onset  More than a month ago    Pain Frequency  Intermittent                       [facility] Adult PT Treatment/Exercise - 06/06/18 0001      Transfers   Transfers  Sit to Stand    Sit to Stand  5: Supervision      Ambulation/Gait   Ambulation/Gait  Yes    Ambulation/Gait Assistance  4: Min guard    Ambulation/Gait Assistance Details  cues to stay close inside RW  Ambulation Distance (Feet)  115 Feet   2 reps   Assistive device  Rolling walker    Gait Pattern  Decreased step length - right;Decreased step length - left;Ataxic;Trunk rotated posteriorly on right;Decreased trunk rotation    Ambulation Surface  Level;Indoor      Knee/Hip Exercises: Aerobic   Stepper  SciFit level 1.5 x 5" with UE's and LE's           Balance Exercises - 06/06/18 1703      Balance Exercises: Standing   SLS with Vectors  --   3 balance bubbles - touching each for target with +2 HHA     Stepping Strategy  Anterior;Posterior;Lateral;5 reps   with use of chair in front for UE support   Other Standing Exercises  Marching 1 rep inside // bars           PT Short Term Goals - 05/29/18 1335      PT SHORT TERM GOAL #1   Title  Pt will perform HEP for standing balance with caregiver's assistance.    Status  Achieved      PT SHORT TERM GOAL #2   Title  Will assess BERG and improve score by 3 points in order to indicate decreased fall risk.      Baseline  22/56 baseline and 30/56 on  01/24/17    Status  On-going      PT SHORT TERM GOAL #3   Title  Pt will improve 5TSS to <12 seconds with single UE support in order to indicate improved functional strength.      Baseline  15.38 secs with 1 UE support on 04-17-18;  14.57 secs on 05-20-18    Status  Not Met      PT SHORT TERM GOAL #4   Title  Pt will improve TUG to <39 secs in order to indicate decreased fall risk.      Baseline  43.84 secs with RW on 04-17-18; 42.06 secs with RW on 05-20-18    Status  Not Met      PT SHORT TERM GOAL #5   Title  Pt will ambulate x 230' with RW with supervision, including turning around curve on track, to demonstrate improved endurance and balance with ambulation.    Baseline  115' with RW    Status  Not Met        PT Long Term Goals - 04/18/18 1420      PT LONG TERM GOAL #1   Title  Pt will perform updated HEP for balance and functional strengthening with caregiver's assistance.    Time  8    Period  Weeks    Status  New    Target Date  06/18/18      PT LONG TERM GOAL #2   Title  Pt will improve BERG balance score by 6 points from baseline in order to indicate decreased fall risk.      Time  8    Period  Weeks    Status  New    Target Date  06/18/18      PT LONG TERM GOAL #3   Title  Pt will perform TUG with RW in </= 35 secs in order to indicate decreased fall risk.      Baseline  43.84 secs with RW on 04-17-18    Time  8    Period  Weeks    Status  New    Target Date  06/18/18  PT LONG TERM GOAL #4   Title  Pt will perform 5x sit<>stand without UE support in </= 10 secs  in order to indicate improved functional strength.      Baseline  15.38 secs with LUE support on 04-17-18    Time  8    Period  Weeks    Status  New    Target Date  06/18/18      PT LONG TERM GOAL #5   Title  Pt will amb. 89' with RW with SBA on even surfaces to demo increased endurance/activity tolerance.    Baseline  115' with RW on 04-17-18    Time  8    Period  Weeks    Status  New     Target Date  06/18/18            Plan - 06/06/18 1707    Clinical Impression Statement  Pt improving with gait but continues to have more difficulty with turning toward left side - improved with cues to turn walker and to stay inside RW to transition and turn toward left side     Rehab Potential  Fair    Clinical Impairments Affecting Rehab Potential  chronicity of deficits and length of time since onset:  pt has had several OP PT admissions/course of therapy at this facility    PT Frequency  2x / week    PT Duration  8 weeks    PT Treatment/Interventions  ADLs/Self Care Home Management;Functional mobility training;Stair training;Gait training;DME Instruction;Therapeutic activities;Therapeutic exercise;Balance training;Neuromuscular re-education;Patient/family education    PT Next Visit Plan    Try reaching up to target on cabinet for weight shifting activity;  cont gait training with RW for distance/quality, L lateral weight shift.     PT Home Exercise Plan  standing balance exercises - pt may benefit from some PWR! exercises    Consulted and Agree with Plan of Care  Patient       Patient will benefit from skilled therapeutic intervention in order to improve the following deficits and impairments:  Abnormal gait, Decreased activity tolerance, Decreased balance, Decreased cognition, Decreased coordination, Decreased mobility, Decreased knowledge of use of DME, Decreased strength, Postural dysfunction, Pain  Visit Diagnosis: Other abnormalities of gait and mobility  Unsteadiness on feet  Muscle weakness (generalized)     Problem List Patient Active Problem List   Diagnosis Date Noted  . Weakness 10/04/2017  . Acute encephalopathy 10/04/2017  . Acute lower UTI 10/04/2017  . Aortic atherosclerosis ([place]) 10/04/2017  . Grief 09/23/2017  . Osteoporosis 04/16/2017  . Irregular heart beat 11/15/2016  . Hyperglycemia   . Oral thrush 07/18/2016  . Odynophagia 07/18/2016  . Anoxic  brain injury ([place]) 12/29/2015  . Gait disorder 12/28/2015  . Well adult exam 12/08/2014  . Fracture of left humerus 06/24/2014  . Humerus fracture 06/24/2014  . Left elbow pain 11/03/2013  . Ankle ulcer ([place]) 04/02/2012  . Decubitus ulcer of coccyx 05/21/2011  . B12 deficiency 04/04/2010  . HYPERLIPIDEMIA 04/04/2010  . Anxiety state 11/14/2009  . Vitamin D deficiency 10/21/2009  . DM2 (diabetes mellitus, type 2) ([place]) 07/07/2009  . OSTEOARTHRITIS 02/25/2009  . LOW BACK PAIN 02/25/2009  . TOBACCO USER 01/13/2009  . DEPRESSION 02/26/2007  . PAIN, CHRONIC NEC 02/26/2007  . Venous (peripheral) insufficiency 02/26/2007  . GERD 02/26/2007  . HERNIA, INCISIONAL 02/26/2007  . SHOULDER PAIN, LEFT 02/26/2007  . 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 INSOMNIA 02/26/2007  .  Abnormality of gait 02/26/2007  . LEG EDEMA, CHRONIC 02/26/2007    Alda Lea, PT 06/06/2018, 5:12 PM  [facility] [address] [place], [place], [address] Phone: [phone]   Fax:  [phone]  Name: RASHAN PATIENT MRN: [id number] Date of Birth: Mar 03, 1951

## 2018-06-09 ENCOUNTER — Ambulatory Visit: Payer: Medicare Other | Admitting: Physical Therapy

## 2018-06-09 DIAGNOSIS — R2689 Other abnormalities of gait and mobility: Secondary | ICD-10-CM | POA: Diagnosis not present

## 2018-06-09 DIAGNOSIS — R2681 Unsteadiness on feet: Secondary | ICD-10-CM | POA: Diagnosis not present

## 2018-06-09 DIAGNOSIS — M6281 Muscle weakness (generalized): Secondary | ICD-10-CM | POA: Diagnosis not present

## 2018-06-10 ENCOUNTER — Ambulatory Visit: Payer: Medicare Other | Admitting: Physical Therapy

## 2018-06-10 ENCOUNTER — Encounter: Payer: Self-pay | Admitting: Physical Therapy

## 2018-06-10 DIAGNOSIS — Z79891 Long term (current) use of opiate analgesic: Secondary | ICD-10-CM | POA: Diagnosis not present

## 2018-06-10 DIAGNOSIS — M47816 Spondylosis without myelopathy or radiculopathy, lumbar region: Secondary | ICD-10-CM | POA: Diagnosis not present

## 2018-06-10 DIAGNOSIS — M25512 Pain in left shoulder: Secondary | ICD-10-CM | POA: Diagnosis not present

## 2018-06-10 DIAGNOSIS — G894 Chronic pain syndrome: Secondary | ICD-10-CM | POA: Diagnosis not present

## 2018-06-10 NOTE — Therapy (Signed)
[facility] [address] [place], [place], [address] Phone: [phone]   Fax:  [phone]  Physical Therapy Treatment  Patient Details  Name: [name] MRN: [id number] Date of Birth: 02/11/1951 Referring Provider: Dr. Lew Dawes   Encounter Date: 06/09/2018  PT End of Session - 06/10/18 2117    Visit Number  10    Number of Visits  17    Date for PT Re-Evaluation  06/18/18    Authorization Type  Medicare and Medicaid    Authorization Time Period  04-17-18 - 07-16-18    PT Start Time  1147    PT Stop Time  1235    PT Time Calculation (min)  48 min       Past Medical History:  Diagnosis Date  . Anoxic brain injury ([place])   . Anxiety   . Aortic atherosclerosis ([place]) 10/04/2017  . Cellulitis and abscess of other specified site 10/11/2010   Qualifier: Diagnosis of  By: Plotnikov MD, Evie Lacks   . Chronic neck pain   . Chronic pain in shoulder   . COPD (chronic obstructive pulmonary disease) ([place])   . Depression   . Fracture of left humerus 06/24/2014  . Gait disorder   . GERD (gastroesophageal reflux disease)   . Hernia of abdominal cavity   . 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 Hyperlipidemia   . LBP (low back pain)   . Neuropathy   . Osteoarthritis   . Seizure disorder ([place])   . Seizures ([place])   . Type II or unspecified type diabetes mellitus without mention of complication, not stated as uncontrolled 2010  . UTI (urinary tract infection) 10/2017  . Vitamin B 12 deficiency 2011    Past Surgical History:  Procedure Laterality Date  . COLONOSCOPY    . DIAGNOSTIC LAPAROSCOPY     PMH: Exploratory lap  . HERNIA REPAIR     incisional  . ORIF HUMERUS FRACTURE Left 06/24/2014   Procedure: LEFT OPEN REDUCTION INTERNAL FIXATION (ORIF) PROXIMAL HUMERUS FRACTURE;  Surgeon: Johnny Bridge, MD;  Location: [facility];  Service: Orthopedics;  Laterality: Left;  .  PANCREATECTOMY    . TONSILLECTOMY    . TUBAL LIGATION      There were no vitals filed for this visit.  Subjective Assessment - 06/10/18 2112    Subjective  Pt reports no problems or changes since PT session last week; states she has appt with pain doctor on Tues. this week    Patient Stated Goals  "I need to walk again"    Currently in Pain?  Yes    Pain Score  6     Pain Location  Hand    Pain Orientation  Left    Pain Descriptors / Indicators  Aching    Pain Type  Acute pain    Pain Onset  Today    Pain Frequency  Constant                       [facility] Adult PT Treatment/Exercise - 06/10/18 0001      Ambulation/Gait   Ambulation/Gait  Yes    Ambulation/Gait Assistance  4: Min guard    Ambulation/Gait Assistance Details  cues to stay close inside RW    Ambulation Distance (Feet)  80 Feet   35' with RW from [place] to wheelchair at end of session   Assistive device  Rolling walker  Gait Pattern  Decreased step length - right;Decreased step length - left;Ataxic;Trunk rotated posteriorly on right;Decreased trunk rotation    Ambulation Surface  Level;Indoor    Stairs  Yes    Stairs Assistance  4: Min guard    Stair Management Technique  Two rails;Step to pattern;Forwards    Number of Stairs  4    Height of Stairs  6      Knee/Hip Exercises: Aerobic   Stepper  SciFit level 1.5 x 5" with UE's and LE's           Balance Exercises - 06/10/18 2116      Balance Exercises: Standing   Stepping Strategy  Anterior;Posterior;Lateral;5 reps   with use of chair in front for UE support   Other Standing Exercises  stepping over and back of carpet/tile divider  with bil. UE supprot 5 reps eahc leg with min guard assist           PT Short Term Goals - 06/10/18 2123      PT SHORT TERM GOAL #1   Title  Pt will perform HEP for standing balance with caregiver's assistance.    Status  Achieved      PT SHORT TERM GOAL #2   Title  Will assess BERG and improve score  by 3 points in order to indicate decreased fall risk.      Status  On-going      PT SHORT TERM GOAL #3   Title  Pt will improve 5TSS to <12 seconds with single UE support in order to indicate improved functional strength.      Baseline  15.38 secs with 1 UE support on 04-17-18;  14.57 secs on 05-20-18    Status  Not Met      PT SHORT TERM GOAL #4   Title  Pt will improve TUG to <39 secs in order to indicate decreased fall risk.      Status  Not Met      PT SHORT TERM GOAL #5   Title  Pt will ambulate x 230' with RW with supervision, including turning around curve on track, to demonstrate improved endurance and balance with ambulation.    Baseline  115' with RW    Status  Not Met        PT Long Term Goals - 04/18/18 1420      PT LONG TERM GOAL #1   Title  Pt will perform updated HEP for balance and functional strengthening with caregiver's assistance.    Time  8    Period  Weeks    Status  New    Target Date  06/18/18      PT LONG TERM GOAL #2   Title  Pt will improve BERG balance score by 6 points from baseline in order to indicate decreased fall risk.      Time  8    Period  Weeks    Status  New    Target Date  06/18/18      PT LONG TERM GOAL #3   Title  Pt will perform TUG with RW in </= 35 secs in order to indicate decreased fall risk.      Baseline  43.84 secs with RW on 04-17-18    Time  8    Period  Weeks    Status  New    Target Date  06/18/18      PT LONG TERM GOAL #4   Title  Pt will perform  5x sit<>stand without UE support in </= 10 secs  in order to indicate improved functional strength.      Baseline  15.38 secs with LUE support on 04-17-18    Time  8    Period  Weeks    Status  New    Target Date  06/18/18      PT LONG TERM GOAL #5   Title  Pt will amb. 81' with RW with SBA on even surfaces to demo increased endurance/activity tolerance.    Baseline  115' with RW on 04-17-18    Time  8    Period  Weeks    Status  New    Target Date  06/18/18             Plan - 06/10/18 2119    Clinical Impression Statement  This 10th visit progress note covers dates 04-17-18 until 06-09-18.  Pt is progressing towards goals;  pt has met STG #1 with STG improved with sit to stand transfers but not fully met stated goal.  Pt continues to amb. with use of RW and has more difficulty with turning toward left side than she does with turning toward right side.      Rehab Potential  Fair    Clinical Impairments Affecting Rehab Potential  chronicity of deficits and length of time since onset:  pt has had several OP PT admissions/course of therapy at this facility    PT Frequency  2x / week    PT Duration  8 weeks    PT Treatment/Interventions  ADLs/Self Care Home Management;Functional mobility training;Stair training;Gait training;DME Instruction;Therapeutic activities;Therapeutic exercise;Balance training;Neuromuscular re-education;Patient/family education    PT Next Visit Plan    Try reaching up to target on cabinet for weight shifting activity;  cont gait training with RW for distance/quality, L lateral weight shift.     PT Home Exercise Plan  standing balance exercises - pt may benefit from some PWR! exercises    Consulted and Agree with Plan of Care  Patient       Patient will benefit from skilled therapeutic intervention in order to improve the following deficits and impairments:  Abnormal gait, Decreased activity tolerance, Decreased balance, Decreased cognition, Decreased coordination, Decreased mobility, Decreased knowledge of use of DME, Decreased strength, Postural dysfunction, Pain  Visit Diagnosis: Other abnormalities of gait and mobility  Unsteadiness on feet     Problem List Patient Active Problem List   Diagnosis Date Noted  . Weakness 10/04/2017  . Acute encephalopathy 10/04/2017  . Acute lower UTI 10/04/2017  . Aortic atherosclerosis ([place]) 10/04/2017  . Grief 09/23/2017  . Osteoporosis 04/16/2017  . Irregular heart beat  11/15/2016  . Hyperglycemia   . Oral thrush 07/18/2016  . Odynophagia 07/18/2016  . Anoxic brain injury ([place]) 12/29/2015  . Gait disorder 12/28/2015  . Well adult exam 12/08/2014  . Fracture of left humerus 06/24/2014  . Humerus fracture 06/24/2014  . Left elbow pain 11/03/2013  . Ankle ulcer ([place]) 04/02/2012  . Decubitus ulcer of coccyx 05/21/2011  . B12 deficiency 04/04/2010  . HYPERLIPIDEMIA 04/04/2010  . Anxiety state 11/14/2009  . Vitamin D deficiency 10/21/2009  . DM2 (diabetes mellitus, type 2) ([place]) 07/07/2009  . OSTEOARTHRITIS 02/25/2009  . LOW BACK PAIN 02/25/2009  . TOBACCO USER 01/13/2009  . DEPRESSION 02/26/2007  . PAIN, CHRONIC NEC 02/26/2007  . Venous (peripheral) insufficiency 02/26/2007  . GERD 02/26/2007  . HERNIA, INCISIONAL 02/26/2007  . SHOULDER PAIN, LEFT 02/26/2007  . 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 INSOMNIA 02/26/2007  . Abnormality of gait 02/26/2007  . LEG EDEMA, CHRONIC 02/26/2007    Alda Lea, PT 06/10/2018, 9:26 PM  [facility] [address] [place], [place], [address] Phone: [phone]   Fax:  [phone]  Name: ZAYNAB CHIPMAN MRN: [id number] Date of Birth: 30-May-1951

## 2018-06-11 NOTE — Telephone Encounter (Signed)
Can this be resent please? Patient is going to pick it up and bring it to her appointment.

## 2018-06-12 ENCOUNTER — Ambulatory Visit: Payer: Medicare Other | Admitting: Physical Therapy

## 2018-06-12 DIAGNOSIS — R2689 Other abnormalities of gait and mobility: Secondary | ICD-10-CM | POA: Diagnosis not present

## 2018-06-12 DIAGNOSIS — M6281 Muscle weakness (generalized): Secondary | ICD-10-CM

## 2018-06-12 DIAGNOSIS — R2681 Unsteadiness on feet: Secondary | ICD-10-CM

## 2018-06-13 ENCOUNTER — Other Ambulatory Visit: Payer: Self-pay

## 2018-06-13 ENCOUNTER — Encounter: Payer: Self-pay | Admitting: Physical Therapy

## 2018-06-13 MED ORDER — DENOSUMAB 60 MG/ML ~~LOC~~ SOSY: 60.0000 mg | PREFILLED_SYRINGE | Freq: Once | SUBCUTANEOUS | 0 refills | Status: AC

## 2018-06-13 NOTE — Telephone Encounter (Signed)
rx has been sent to [facility].

## 2018-06-13 NOTE — Therapy (Signed)
[facility] [address] [place], [place], [address] Phone: [phone]   Fax:  [phone]  Physical Therapy Treatment  Patient Details  Name: [name] MRN: [id number] Date of Birth: [birth year] Referring Provider: Dr. Lew Dawes   Encounter Date: 06/12/2018  PT End of Session - 06/13/18 1055    Visit Number  11    Number of Visits  17    Date for PT Re-Evaluation  06/18/18    Authorization Type  Medicare and Medicaid    Authorization Time Period  04-17-18 - 07-16-18    PT Start Time  5956    PT Stop Time  1447    PT Time Calculation (min)  44 min       Past Medical History:  Diagnosis Date  . Anoxic brain injury ([place])   . Anxiety   . Aortic atherosclerosis ([place]) 10/04/2017  . Cellulitis and abscess of other specified site 10/11/2010   Qualifier: Diagnosis of  By: Plotnikov MD, Evie Lacks   . Chronic neck pain   . Chronic pain in shoulder   . COPD (chronic obstructive pulmonary disease) ([place])   . Depression   . Fracture of left humerus 06/24/2014  . Gait disorder   . GERD (gastroesophageal reflux disease)   . Hernia of abdominal cavity   . 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 Hyperlipidemia   . LBP (low back pain)   . Neuropathy   . Osteoarthritis   . Seizure disorder ([place])   . Seizures ([place])   . Type II or unspecified type diabetes mellitus without mention of complication, not stated as uncontrolled 2010  . UTI (urinary tract infection) 10/2017  . Vitamin B 12 deficiency 2011    Past Surgical History:  Procedure Laterality Date  . COLONOSCOPY    . DIAGNOSTIC LAPAROSCOPY     PMH: Exploratory lap  . HERNIA REPAIR     incisional  . ORIF HUMERUS FRACTURE Left 06/24/2014   Procedure: LEFT OPEN REDUCTION INTERNAL FIXATION (ORIF) PROXIMAL HUMERUS FRACTURE;  Surgeon: Johnny Bridge, MD;  Location: [facility];  Service: Orthopedics;  Laterality: Left;  .  PANCREATECTOMY    . TONSILLECTOMY    . TUBAL LIGATION      There were no vitals filed for this visit.  Subjective Assessment - 06/13/18 1047    Subjective  Pt states she saw her pain doctor on Tuesday - reports he would not change her pain medication - she reports her current pain medication does not really help alot in decreasing her pain    Patient Stated Goals  "I need to walk again"    Currently in Pain?  Yes    Pain Score  7     Pain Location  Back    Pain Orientation  Lower    Pain Descriptors / Indicators  Aching;Nagging    Pain Type  Chronic pain    Pain Onset  More than a month ago    Pain Frequency  Constant                       [facility] Adult PT Treatment/Exercise - 06/13/18 0001      Transfers   Transfers  Sit to Stand    Sit to Stand  5: Supervision    Number of Reps  Other reps (comment)   3   Comments  LUE support used      Ambulation/Gait  Ambulation/Gait  Yes    Ambulation/Gait Assistance  4: Min guard    Ambulation/Gait Assistance Details  cues to stay close inside RW and to increase step length    Ambulation Distance (Feet)  115 Feet    Assistive device  Rolling walker    Gait Pattern  Decreased step length - right;Decreased step length - left;Ataxic;Trunk rotated posteriorly on right;Decreased trunk rotation    Ambulation Surface  Level;Indoor    Gait Comments  Pt amb. from [place] to wheelchair approx. 12' at end of session - short distance due to c/o fatigue       Exercises   Exercises  Knee/Hip;Ankle      Knee/Hip Exercises: Aerobic   Stepper  SciFit level 1.5 x 5" with UE's and LE's       Knee/Hip Exercises: Seated   Long Arc Quad  AROM;Both;1 set;10 reps          Balance Exercises - 06/13/18 1051      Balance Exercises: Standing   Stepping Strategy  Anterior;Posterior;Lateral;UE support;5 reps   5 reps each leg each direction with bil. UE support    Other Standing Exercises  2' step used inside // bars - pt performed  stepping onto step with LLE and then reaching up and forward with LUE with RLE off floor - with RUE support on // bar - with min assist - performed 3 reps due to c/o difficulty with this activity           PT Short Term Goals - 06/10/18 2123      PT SHORT TERM GOAL #1   Title  Pt will perform HEP for standing balance with caregiver's assistance.    Status  Achieved      PT SHORT TERM GOAL #2   Title  Will assess BERG and improve score by 3 points in order to indicate decreased fall risk.      Status  On-going      PT SHORT TERM GOAL #3   Title  Pt will improve 5TSS to <12 seconds with single UE support in order to indicate improved functional strength.      Baseline  15.38 secs with 1 UE support on 04-17-18;  14.57 secs on 05-20-18    Status  Not Met      PT SHORT TERM GOAL #4   Title  Pt will improve TUG to <39 secs in order to indicate decreased fall risk.      Status  Not Met      PT SHORT TERM GOAL #5   Title  Pt will ambulate x 230' with RW with supervision, including turning around curve on track, to demonstrate improved endurance and balance with ambulation.    Baseline  115' with RW    Status  Not Met        PT Long Term Goals - 04/18/18 1420      PT LONG TERM GOAL #1   Title  Pt will perform updated HEP for balance and functional strengthening with caregiver's assistance.    Time  8    Period  Weeks    Status  New    Target Date  06/18/18      PT LONG TERM GOAL #2   Title  Pt will improve BERG balance score by 6 points from baseline in order to indicate decreased fall risk.      Time  8    Period  Weeks    Status  New  Target Date  06/18/18      PT LONG TERM GOAL #3   Title  Pt will perform TUG with RW in </= 35 secs in order to indicate decreased fall risk.      Baseline  43.84 secs with RW on 04-17-18    Time  8    Period  Weeks    Status  New    Target Date  06/18/18      PT LONG TERM GOAL #4   Title  Pt will perform 5x sit<>stand without UE  support in </= 10 secs  in order to indicate improved functional strength.      Baseline  15.38 secs with LUE support on 04-17-18    Time  8    Period  Weeks    Status  New    Target Date  06/18/18      PT LONG TERM GOAL #5   Title  Pt will amb. 34' with RW with SBA on even surfaces to demo increased endurance/activity tolerance.    Baseline  115' with RW on 04-17-18    Time  8    Period  Weeks    Status  New    Target Date  06/18/18            Plan - 06/13/18 1056    Clinical Impression Statement  Pt had much difficulty performing step activity inside // bars with stepping onto step with LLE and then weight shifting onto LLE while reaching up with LUE; pt continues to have much difficulty with turning toward left side     Rehab Potential  Fair    Clinical Impairments Affecting Rehab Potential  chronicity of deficits and length of time since onset:  pt has had several OP PT admissions/course of therapy at this facility    PT Frequency  2x / week    PT Duration  8 weeks    PT Treatment/Interventions  ADLs/Self Care Home Management;Functional mobility training;Stair training;Gait training;DME Instruction;Therapeutic activities;Therapeutic exercise;Balance training;Neuromuscular re-education;Patient/family education    PT Next Visit Plan  cont gait training with RW for distance/quality, L lateral weight ; check LTG's - due 06-18-18 -- renew    PT Home Exercise Plan  standing balance exercises - pt may benefit from some PWR! exercises    Consulted and Agree with Plan of Care  Patient       Patient will benefit from skilled therapeutic intervention in order to improve the following deficits and impairments:  Abnormal gait, Decreased activity tolerance, Decreased balance, Decreased cognition, Decreased coordination, Decreased mobility, Decreased knowledge of use of DME, Decreased strength, Postural dysfunction, Pain  Visit Diagnosis: Other abnormalities of gait and  mobility  Unsteadiness on feet  Muscle weakness (generalized)     Problem List Patient Active Problem List   Diagnosis Date Noted  . Weakness 10/04/2017  . Acute encephalopathy 10/04/2017  . Acute lower UTI 10/04/2017  . Aortic atherosclerosis ([place]) 10/04/2017  . Grief 09/23/2017  . Osteoporosis 04/16/2017  . Irregular heart beat 11/15/2016  . Hyperglycemia   . Oral thrush 07/18/2016  . Odynophagia 07/18/2016  . Anoxic brain injury ([place]) 12/29/2015  . Gait disorder 12/28/2015  . Well adult exam 12/08/2014  . Fracture of left humerus 06/24/2014  . Humerus fracture 06/24/2014  . Left elbow pain 11/03/2013  . Ankle ulcer ([place]) 04/02/2012  . Decubitus ulcer of coccyx 05/21/2011  . B12 deficiency 04/04/2010  . HYPERLIPIDEMIA 04/04/2010  . Anxiety state 11/14/2009  . Vitamin D  deficiency 10/21/2009  . DM2 (diabetes mellitus, type 2) ([place]) 07/07/2009  . OSTEOARTHRITIS 02/25/2009  . LOW BACK PAIN 02/25/2009  . TOBACCO USER 01/13/2009  . DEPRESSION 02/26/2007  . PAIN, CHRONIC NEC 02/26/2007  . Venous (peripheral) insufficiency 02/26/2007  . GERD 02/26/2007  . HERNIA, INCISIONAL 02/26/2007  . SHOULDER PAIN, LEFT 02/26/2007  . 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 INSOMNIA 02/26/2007  . Abnormality of gait 02/26/2007  . LEG EDEMA, CHRONIC 02/26/2007    Alda Lea, PT 06/13/2018, 11:01 AM  [facility] [address] [place], [place], [address] Phone: [phone]   Fax:  [phone]  Name: [name] MRN: [id number] Date of Birth: [birth year]

## 2018-06-17 ENCOUNTER — Ambulatory Visit: Payer: Medicare Other | Admitting: Physical Therapy

## 2018-06-17 DIAGNOSIS — R2681 Unsteadiness on feet: Secondary | ICD-10-CM

## 2018-06-17 DIAGNOSIS — M6281 Muscle weakness (generalized): Secondary | ICD-10-CM | POA: Diagnosis not present

## 2018-06-17 DIAGNOSIS — R2689 Other abnormalities of gait and mobility: Secondary | ICD-10-CM

## 2018-06-18 ENCOUNTER — Encounter: Payer: Self-pay | Admitting: Physical Therapy

## 2018-06-18 NOTE — Therapy (Signed)
[facility] [address] [place], [place], [address] Phone: [phone]   Fax:  [phone]  Physical Therapy Treatment  Patient Details  Name: [name] MRN: [id number] Date of Birth: 06/28/1951 Referring Provider: Dr. Lew Dawes   Encounter Date: 06/17/2018  PT End of Session - 06/18/18 1102    Visit Number  12    Number of Visits  17    Date for PT Re-Evaluation  07/03/18    Authorization Type  Medicare and Medicaid    Authorization Time Period  04-17-18 - 07-16-18    PT Start Time  1405    PT Stop Time  1447    PT Time Calculation (min)  42 min       Past Medical History:  Diagnosis Date  . Anoxic brain injury ([place])   . Anxiety   . Aortic atherosclerosis ([place]) 10/04/2017  . Cellulitis and abscess of other specified site 10/11/2010   Qualifier: Diagnosis of  By: Plotnikov MD, Evie Lacks   . Chronic neck pain   . Chronic pain in shoulder   . COPD (chronic obstructive pulmonary disease) ([place])   . Depression   . Fracture of left humerus 06/24/2014  . Gait disorder   . GERD (gastroesophageal reflux disease)   . Hernia of abdominal cavity   . 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 Hyperlipidemia   . LBP (low back pain)   . Neuropathy   . Osteoarthritis   . Seizure disorder ([place])   . Seizures ([place])   . Type II or unspecified type diabetes mellitus without mention of complication, not stated as uncontrolled 2010  . UTI (urinary tract infection) 10/2017  . Vitamin B 12 deficiency 2011    Past Surgical History:  Procedure Laterality Date  . COLONOSCOPY    . DIAGNOSTIC LAPAROSCOPY     PMH: Exploratory lap  . HERNIA REPAIR     incisional  . ORIF HUMERUS FRACTURE Left 06/24/2014   Procedure: LEFT OPEN REDUCTION INTERNAL FIXATION (ORIF) PROXIMAL HUMERUS FRACTURE;  Surgeon: Johnny Bridge, MD;  Location: [facility];  Service: Orthopedics;  Laterality: Left;  .  PANCREATECTOMY    . TONSILLECTOMY    . TUBAL LIGATION      There were no vitals filed for this visit.  Subjective Assessment - 06/18/18 1057    Subjective  Pt states she thinks she fell last night (Monday night) - states her husband Marya Amsler must have gotten her up - states she has a bruise on her left thigh but says she did not know it until her CNA told her it was there;    Patient is accompained by:  --   CNA Terry   Patient Stated Goals  "I need to walk again"    Currently in Pain?  Yes    Pain Score  8     Pain Location  Back    Pain Orientation  Lower    Pain Descriptors / Indicators  Aching;Sore    Pain Type  Chronic pain    Pain Onset  More than a month ago    Pain Frequency  Constant                       [facility] Adult PT Treatment/Exercise - 06/18/18 0001      Transfers   Transfers  Sit to Stand    Sit to Stand  5: Supervision  Number of Reps  Other reps (comment)   5   Comments  no UE support used - from mat table       Ambulation/Gait   Ambulation/Gait  Yes    Ambulation/Gait Assistance  4: Min guard    Ambulation/Gait Assistance Details  cues to stand erect and to stay close to RW    Ambulation Distance (Feet)  115 Feet   35' 2nd rep   Assistive device  Rolling walker    Gait Pattern  Decreased step length - right;Decreased step length - left;Ataxic;Trunk rotated posteriorly on right;Decreased trunk rotation    Ambulation Surface  Level;Indoor      Knee/Hip Exercises: Aerobic   Stepper  SciFit level 1.5 x 5" with UE's and LE's           Balance Exercises - 06/18/18 1101      Balance Exercises: Standing   Stepping Strategy  Anterior;Posterior;Lateral;UE support;5 reps   5 reps each leg each direction with bil. UE support    Other Standing Exercises  Marching in place 5 reps with each leg with bil. UE support on RW:  alternate tap ups to 4" step with bil. UE support; pt performed standing side kicks 5 reps each leg and forward kicks 5 reps  each leg inside // bars with bil. UE support          PT Short Term Goals - 06/10/18 2123      PT SHORT TERM GOAL #1   Title  Pt will perform HEP for standing balance with caregiver's assistance.    Status  Achieved      PT SHORT TERM GOAL #2   Title  Will assess BERG and improve score by 3 points in order to indicate decreased fall risk.      Status  On-going      PT SHORT TERM GOAL #3   Title  Pt will improve 5TSS to <12 seconds with single UE support in order to indicate improved functional strength.      Baseline  15.38 secs with 1 UE support on 04-17-18;  14.57 secs on 05-20-18    Status  Not Met      PT SHORT TERM GOAL #4   Title  Pt will improve TUG to <39 secs in order to indicate decreased fall risk.      Status  Not Met      PT SHORT TERM GOAL #5   Title  Pt will ambulate x 230' with RW with supervision, including turning around curve on track, to demonstrate improved endurance and balance with ambulation.    Baseline  115' with RW    Status  Not Met        PT Long Term Goals - 04/18/18 1420      PT LONG TERM GOAL #1   Title  Pt will perform updated HEP for balance and functional strengthening with caregiver's assistance.    Time  8    Period  Weeks    Status  New    Target Date  06/18/18      PT LONG TERM GOAL #2   Title  Pt will improve BERG balance score by 6 points from baseline in order to indicate decreased fall risk.      Time  8    Period  Weeks    Status  New    Target Date  06/18/18      PT LONG TERM GOAL #3   Title  Pt will perform TUG with RW in </= 35 secs in order to indicate decreased fall risk.      Baseline  43.84 secs with RW on 04-17-18    Time  8    Period  Weeks    Status  New    Target Date  06/18/18      PT LONG TERM GOAL #4   Title  Pt will perform 5x sit<>stand without UE support in </= 10 secs  in order to indicate improved functional strength.      Baseline  15.38 secs with LUE support on 04-17-18    Time  8    Period   Weeks    Status  New    Target Date  06/18/18      PT LONG TERM GOAL #5   Title  Pt will amb. 86' with RW with SBA on even surfaces to demo increased endurance/activity tolerance.    Baseline  115' with RW on 04-17-18    Time  8    Period  Weeks    Status  New    Target Date  06/18/18            Plan - 06/18/18 1104    Clinical Impression Statement  Pt reported not feeling well today due to having had upset stomach on Monday and also reporting increased soreness due to possible fall sustained Monday night.  Pt continues to have difficulty with turning toward left side - improved with verbal cues for weight shift.     Rehab Potential  Fair    Clinical Impairments Affecting Rehab Potential  chronicity of deficits and length of time since onset:  pt has had several OP PT admissions/course of therapy at this facility    PT Frequency  2x / week    PT Duration  8 weeks    PT Treatment/Interventions  ADLs/Self Care Home Management;Functional mobility training;Stair training;Gait training;DME Instruction;Therapeutic activities;Therapeutic exercise;Balance training;Neuromuscular re-education;Patient/family education    PT Next Visit Plan  cont gait training with RW for distance/quality, L lateral weight ; check LTG's - due 06-18-18 -- renew    PT Home Exercise Plan  standing balance exercises - pt may benefit from some PWR! exercises    Consulted and Agree with Plan of Care  Patient       Patient will benefit from skilled therapeutic intervention in order to improve the following deficits and impairments:  Abnormal gait, Decreased activity tolerance, Decreased balance, Decreased cognition, Decreased coordination, Decreased mobility, Decreased knowledge of use of DME, Decreased strength, Postural dysfunction, Pain  Visit Diagnosis: Other abnormalities of gait and mobility  Unsteadiness on feet  Muscle weakness (generalized)     Problem List Patient Active Problem List   Diagnosis  Date Noted  . Weakness 10/04/2017  . Acute encephalopathy 10/04/2017  . Acute lower UTI 10/04/2017  . Aortic atherosclerosis ([place]) 10/04/2017  . Grief 09/23/2017  . Osteoporosis 04/16/2017  . Irregular heart beat 11/15/2016  . Hyperglycemia   . Oral thrush 07/18/2016  . Odynophagia 07/18/2016  . Anoxic brain injury ([place]) 12/29/2015  . Gait disorder 12/28/2015  . Well adult exam 12/08/2014  . Fracture of left humerus 06/24/2014  . Humerus fracture 06/24/2014  . Left elbow pain 11/03/2013  . Ankle ulcer ([place]) 04/02/2012  . Decubitus ulcer of coccyx 05/21/2011  . B12 deficiency 04/04/2010  . HYPERLIPIDEMIA 04/04/2010  . Anxiety state 11/14/2009  . Vitamin D deficiency 10/21/2009  . DM2 (diabetes mellitus, type 2) ([place]) 07/07/2009  .  OSTEOARTHRITIS 02/25/2009  . LOW BACK PAIN 02/25/2009  . TOBACCO USER 01/13/2009  . DEPRESSION 02/26/2007  . PAIN, CHRONIC NEC 02/26/2007  . Venous (peripheral) insufficiency 02/26/2007  . GERD 02/26/2007  . HERNIA, INCISIONAL 02/26/2007  . SHOULDER PAIN, LEFT 02/26/2007  . 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 INSOMNIA 02/26/2007  . Abnormality of gait 02/26/2007  . LEG EDEMA, CHRONIC 02/26/2007    Alda Lea, PT 06/18/2018, 11:08 AM  [facility] [address] [place], [place], [address] Phone: [phone]   Fax:  [phone]  Name: [name] MRN: [id number] Date of Birth: 1950/10/30

## 2018-06-19 ENCOUNTER — Encounter: Payer: Self-pay | Admitting: Physical Therapy

## 2018-06-19 ENCOUNTER — Ambulatory Visit: Payer: Medicare Other | Admitting: Physical Therapy

## 2018-06-19 ENCOUNTER — Telehealth: Payer: Self-pay

## 2018-06-19 DIAGNOSIS — R2689 Other abnormalities of gait and mobility: Secondary | ICD-10-CM

## 2018-06-19 DIAGNOSIS — R2681 Unsteadiness on feet: Secondary | ICD-10-CM | POA: Diagnosis not present

## 2018-06-19 DIAGNOSIS — M6281 Muscle weakness (generalized): Secondary | ICD-10-CM | POA: Diagnosis not present

## 2018-06-19 NOTE — Telephone Encounter (Signed)
Copied from CRM [id number]. Topic: General - Other >> Jun 19, 2018 10:34 AM Terisa Starraylor, Brittany L wrote: Reason for CRM: Aurther Lofterry (caregiver) called and said that the pharmacy will not get the " prolia " injection in until the 25 th and wanted to know could the patient come that day? There is nothing available that day, I offered the next available spot which was 10/31. Aurther Lofterry stated that is not going to work because this has to stay on ice. Please advise.  I have talked with terry/caregiver for ms. [place]---pharmacy is sending prolia to [place] office to use for ms Schoeller----we will have med here with her name on it, patient has been scheduled, ok to override for this patient on 9/25 at 10:40 per Clover Feehan,RN

## 2018-06-20 ENCOUNTER — Encounter: Payer: Self-pay | Admitting: Physical Therapy

## 2018-06-20 NOTE — Therapy (Signed)
[facility] [address] [place], [place], [address] Phone: [phone]   Fax:  [phone]  Physical Therapy Treatment  Patient Details  Name: [name] MRN: [id number] Date of Birth: 12/16/1950 Referring Provider: Dr. Lew Dawes   Encounter Date: 06/19/2018  PT End of Session - 06/20/18 1120    Visit Number  13    Number of Visits  17    Date for PT Re-Evaluation  07/03/18    Authorization Type  Medicare and Medicaid    Authorization Time Period  04-17-18 - 07-16-18    PT Start Time  1405    PT Stop Time  1447    PT Time Calculation (min)  42 min       Past Medical History:  Diagnosis Date  . Anoxic brain injury ([place])   . Anxiety   . Aortic atherosclerosis ([place]) 10/04/2017  . Cellulitis and abscess of other specified site 10/11/2010   Qualifier: Diagnosis of  By: Plotnikov MD, Evie Lacks   . Chronic neck pain   . Chronic pain in shoulder   . COPD (chronic obstructive pulmonary disease) ([place])   . Depression   . Fracture of left humerus 06/24/2014  . Gait disorder   . GERD (gastroesophageal reflux disease)   . Hernia of abdominal cavity   . 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 Hyperlipidemia   . LBP (low back pain)   . Neuropathy   . Osteoarthritis   . Seizure disorder ([place])   . Seizures ([place])   . Type II or unspecified type diabetes mellitus without mention of complication, not stated as uncontrolled 2010  . UTI (urinary tract infection) 10/2017  . Vitamin B 12 deficiency 2011    Past Surgical History:  Procedure Laterality Date  . COLONOSCOPY    . DIAGNOSTIC LAPAROSCOPY     PMH: Exploratory lap  . HERNIA REPAIR     incisional  . ORIF HUMERUS FRACTURE Left 06/24/2014   Procedure: LEFT OPEN REDUCTION INTERNAL FIXATION (ORIF) PROXIMAL HUMERUS FRACTURE;  Surgeon: Johnny Bridge, MD;  Location: [facility];  Service: Orthopedics;  Laterality: Left;  .  PANCREATECTOMY    . TONSILLECTOMY    . TUBAL LIGATION      There were no vitals filed for this visit.  Subjective Assessment - 06/20/18 1115    Subjective  Pt reports no new problems at today's visit    Patient Stated Goals  "I need to walk again"                       [facility] Adult PT Treatment/Exercise - 06/20/18 0001      Transfers   Transfers  Sit to Stand    Sit to Stand  5: Supervision    Number of Reps  Other reps (comment)   5   Comments  no UE support used - from mat table       Ambulation/Gait   Ambulation/Gait  Yes    Ambulation/Gait Assistance  4: Min guard    Ambulation/Gait Assistance Details  cues to stand erect and to stay inside RW    Ambulation Distance (Feet)  115 Feet   40' first rep    Assistive device  Rolling walker    Gait Pattern  Decreased step length - right;Decreased step length - left;Ataxic;Trunk rotated posteriorly on right;Decreased trunk rotation    Ambulation Surface  Level;Indoor    Gait Comments  Pt performed 2 reps of amb. inside // bars with RUE support only  20'x 2 reps with seated rest break between reps      Knee/Hip Exercises: Aerobic   Stepper  SciFit level 1.5 x 5" with UE's and LE's           Balance Exercises - 06/20/18 1118      Balance Exercises: Standing   Standing Eyes Opened  Wide (BOA);3 reps   with horizontal head turns (inside // bars)   Stepping Strategy  Anterior;Posterior;Lateral;UE support;5 reps   5 reps each leg each direction with bil. UE support    Turning  Right;3 reps   inside // bars with CGA   Other Standing Exercises  Marching in place 5 reps with each leg with bil. UE support on RW:  alternate tap ups to 4" step with bil. UE support; pt performed standing side kicks 5 reps each leg and forward kicks 5 reps each leg inside // bars with bil. UE support          PT Short Term Goals - 06/10/18 2123      PT SHORT TERM GOAL #1   Title  Pt will perform HEP for standing balance with  caregiver's assistance.    Status  Achieved      PT SHORT TERM GOAL #2   Title  Will assess BERG and improve score by 3 points in order to indicate decreased fall risk.      Status  On-going      PT SHORT TERM GOAL #3   Title  Pt will improve 5TSS to <12 seconds with single UE support in order to indicate improved functional strength.      Baseline  15.38 secs with 1 UE support on 04-17-18;  14.57 secs on 05-20-18    Status  Not Met      PT SHORT TERM GOAL #4   Title  Pt will improve TUG to <39 secs in order to indicate decreased fall risk.      Status  Not Met      PT SHORT TERM GOAL #5   Title  Pt will ambulate x 230' with RW with supervision, including turning around curve on track, to demonstrate improved endurance and balance with ambulation.    Baseline  115' with RW    Status  Not Met        PT Long Term Goals - 06/20/18 1123      PT LONG TERM GOAL #1   Title  Pt will perform updated HEP for balance and functional strengthening with caregiver's assistance.    Status  On-going    Target Date  07/03/18      PT LONG TERM GOAL #2   Title  Pt will improve BERG balance score by 6 points from baseline in order to indicate decreased fall risk.      Baseline  30/56 on 02/21/17, met at [facility] session, however has not made progress since half way mark.      Status  On-going    Target Date  07/03/18      PT LONG TERM GOAL #3   Title  Pt will perform TUG with RW in </= 35 secs in order to indicate decreased fall risk.      Baseline  43.84 secs with RW on 04-17-18    Status  On-going    Target Date  07/03/18      PT LONG TERM GOAL #4   Title  Pt will perform 5x sit<>stand without UE support in </= 10 secs  in order to indicate improved functional strength.      Baseline  15.38 secs with LUE support on 04-17-18    Status  On-going    Target Date  07/03/18      PT LONG TERM GOAL #5   Title  Pt will amb. 29' with RW with SBA on even surfaces to demo increased endurance/activity  tolerance.    Baseline  115' with RW on 04-17-18    Status  On-going    Target Date  07/03/18            Plan - 06/20/18 1120    Clinical Impression Statement  Pt able to tolerate increased standing balance activities during today's session - continued to require seated rest breaks due to c/o legs being fatigued.  Pt amb. 66' in initial part of PT session and was able to amb. 115' near end of PT session.      Rehab Potential  Fair    Clinical Impairments Affecting Rehab Potential  chronicity of deficits and length of time since onset:  pt has had several OP PT admissions/course of therapy at this facility    PT Frequency  2x / week    PT Duration  8 weeks    PT Treatment/Interventions  ADLs/Self Care Home Management;Functional mobility training;Stair training;Gait training;DME Instruction;Therapeutic activities;Therapeutic exercise;Balance training;Neuromuscular re-education;Patient/family education    PT Next Visit Plan  cont gait training with RW for distance/quality, L lateral weight ; check LTG's - due 06-18-18 -- renew       Patient will benefit from skilled therapeutic intervention in order to improve the following deficits and impairments:  Abnormal gait, Decreased activity tolerance, Decreased balance, Decreased cognition, Decreased coordination, Decreased mobility, Decreased knowledge of use of DME, Decreased strength, Postural dysfunction, Pain  Visit Diagnosis: Other abnormalities of gait and mobility  Unsteadiness on feet  Muscle weakness (generalized)     Problem List Patient Active Problem List   Diagnosis Date Noted  . Weakness 10/04/2017  . Acute encephalopathy 10/04/2017  . Acute lower UTI 10/04/2017  . Aortic atherosclerosis ([place]) 10/04/2017  . Grief 09/23/2017  . Osteoporosis 04/16/2017  . Irregular heart beat 11/15/2016  . Hyperglycemia   . Oral thrush 07/18/2016  . Odynophagia 07/18/2016  . Anoxic brain injury ([place]) 12/29/2015  . Gait disorder  12/28/2015  . Well adult exam 12/08/2014  . Fracture of left humerus 06/24/2014  . Humerus fracture 06/24/2014  . Left elbow pain 11/03/2013  . Ankle ulcer ([place]) 04/02/2012  . Decubitus ulcer of coccyx 05/21/2011  . B12 deficiency 04/04/2010  . HYPERLIPIDEMIA 04/04/2010  . Anxiety state 11/14/2009  . Vitamin D deficiency 10/21/2009  . DM2 (diabetes mellitus, type 2) ([place]) 07/07/2009  . OSTEOARTHRITIS 02/25/2009  . LOW BACK PAIN 02/25/2009  . TOBACCO USER 01/13/2009  . DEPRESSION 02/26/2007  . PAIN, CHRONIC NEC 02/26/2007  . Venous (peripheral) insufficiency 02/26/2007  . GERD 02/26/2007  . HERNIA, INCISIONAL 02/26/2007  . SHOULDER PAIN, LEFT 02/26/2007  . 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 INSOMNIA 02/26/2007  . Abnormality of gait 02/26/2007  . LEG EDEMA, CHRONIC 02/26/2007    Alda Lea, PT 06/20/2018, 11:26 AM  [facility] [address] [place], [place], [address] Phone: [phone]   Fax:  [phone]  Name: MAISON AGRUSA MRN: [id number] Date of Birth: [birth year]

## 2018-06-23 ENCOUNTER — Ambulatory Visit (INDEPENDENT_AMBULATORY_CARE_PROVIDER_SITE_OTHER): Payer: Medicare Other | Admitting: Internal Medicine

## 2018-06-23 ENCOUNTER — Other Ambulatory Visit (INDEPENDENT_AMBULATORY_CARE_PROVIDER_SITE_OTHER): Payer: Medicare Other

## 2018-06-23 ENCOUNTER — Encounter: Payer: Self-pay | Admitting: Internal Medicine

## 2018-06-23 VITALS — BP 136/82 | HR 77 | Temp 98.3°F | Ht 65.0 in | Wt 165.0 lb

## 2018-06-23 DIAGNOSIS — Z23 Encounter for immunization: Secondary | ICD-10-CM

## 2018-06-23 DIAGNOSIS — E1100 Type 2 diabetes mellitus with hyperosmolarity without nonketotic hyperglycemic-hyperosmolar coma (NKHHC): Secondary | ICD-10-CM

## 2018-06-23 DIAGNOSIS — E538 Deficiency of other specified B group vitamins: Secondary | ICD-10-CM | POA: Diagnosis not present

## 2018-06-23 DIAGNOSIS — G931 Anoxic brain damage, not elsewhere classified: Secondary | ICD-10-CM | POA: Diagnosis not present

## 2018-06-23 DIAGNOSIS — M810 Age-related osteoporosis without current pathological fracture: Secondary | ICD-10-CM | POA: Diagnosis not present

## 2018-06-23 DIAGNOSIS — E559 Vitamin D deficiency, unspecified: Secondary | ICD-10-CM

## 2018-06-23 DIAGNOSIS — R531 Weakness: Secondary | ICD-10-CM | POA: Diagnosis not present

## 2018-06-23 LAB — BASIC METABOLIC PANEL
BUN: 14 mg/dL (ref 6–23)
CO2: 33 mEq/L — ABNORMAL HIGH (ref 19–32)
Calcium: 9.5 mg/dL (ref 8.4–10.5)
Chloride: 96 mEq/L (ref 96–112)
Creatinine, Ser: 0.68 mg/dL (ref 0.40–1.20)
GFR: 91.5 mL/min (ref >60.00)
Glucose, Bld: 176 mg/dL — ABNORMAL HIGH (ref 70–99)
Potassium: 4.5 mEq/L (ref 3.5–5.1)
Sodium: 136 mEq/L (ref 135–145)

## 2018-06-23 LAB — HEMOGLOBIN A1C: Hgb A1c MFr Bld: 8.8 % — ABNORMAL HIGH (ref 4.6–6.5)

## 2018-06-23 MED ORDER — DENOSUMAB 60 MG/ML ~~LOC~~ SOSY
60.0000 mg | PREFILLED_SYRINGE | Freq: Once | SUBCUTANEOUS | Status: AC
  Administered 2018-06-23: 60 mg via SUBCUTANEOUS

## 2018-06-23 NOTE — Assessment & Plan Note (Signed)
In PT 

## 2018-06-23 NOTE — Progress Notes (Signed)
Subjective:  Patient ID: [name], female    DOB: 1951/06/05  Age: 67 y.o. MRN: [id number]  CC: No chief complaint on file.   HPI [name] presents for ABI, ataxia, chronic pain f/u  Outpatient Medications Prior to Visit  Medication Sig Dispense Refill  . albuterol (PROVENTIL HFA;VENTOLIN HFA) 108 (90 Base) MCG/ACT inhaler Inhale 1-2 puffs into the lungs every 6 (six) hours as needed for wheezing or shortness of breath. 1 Inhaler 0  . aspirin 81 MG EC tablet Take 81 mg by mouth daily.      Marland Kitchen b complex vitamins tablet Take 1 tablet by mouth daily. 100 tablet 3  . benzonatate (TESSALON) 200 MG capsule Take 1 capsule (200 mg total) by mouth 3 (three) times daily as needed for cough. 60 capsule 0  . calcium carbonate (OSCAL) 1500 (600 Ca) MG TABS tablet Take 1 tablet (1,500 mg total) by mouth 2 (two) times daily with a meal. 60 tablet 4  . carbidopa-levodopa (SINEMET IR) 25-250 MG tablet TAKE 1 TABLET BY MOUTH THREE TIMES A DAY 270 tablet 2  . cholecalciferol (VITAMIN D) 1000 UNITS tablet Take 2 tablets (2,000 Units total) by mouth daily. 100 tablet 3  . glucose blood (ONETOUCH VERIO) test strip Use to check blood sugars twice a day. Dx E11.9 100 each 5  . HYDROmorphone (DILAUDID) 2 MG tablet Take 2-3 tablets (4-6 mg total) by mouth every 6 (six) hours as needed for moderate pain or severe pain. 75 tablet 0  . LORazepam (ATIVAN) 2 MG tablet TAKE ONE TABLET THREE TIMES A DAY 90 tablet 3  . metFORMIN (GLUCOPHAGE XR) 750 MG 24 hr tablet Take 1 tablet (750 mg total) by mouth 2 (two) times daily. 180 tablet 3  . metoprolol tartrate (LOPRESSOR) 25 MG tablet Take 0.5 tablets (12.5 mg total) 2 (two) times daily by mouth. 90 tablet 3  . mirtazapine (REMERON) 15 MG tablet TAKE 1 TABLET BY MOUTH AT BEDTIME AT 6-8 PM 90 tablet 2  . nitrofurantoin (MACRODANTIN) 100 MG capsule Take 1 capsule (100 mg total) by mouth every 12 (twelve) hours. 4 capsule 0  . ONETOUCH DELICA LANCETS 33G MISC Use to help  check blood sugars twice a day. Dx E11.9 100 each 3  . promethazine (PHENERGAN) 12.5 MG tablet Take 1 tablet (12.5 mg total) by mouth every 6 (six) hours as needed. for nausea 60 tablet 0  . repaglinide (PRANDIN) 2 MG tablet TAKE 1 TABLET (2 MG TOTAL) BY MOUTH 3 (THREE) TIMES DAILY BEFORE MEALS. 90 tablet 11  . tamsulosin (FLOMAX) 0.4 MG CAPS capsule TAKE 1 CAPSULE (0.4 MG TOTAL) DAILY BY MOUTH. 90 capsule 1  . traMADol-acetaminophen (ULTRACET) 37.5-325 MG tablet TAKE 1 TABLET BY MOUTH EVERY SIX HOURS AS NEEDED FOR PAIN * STOP TAKING PLAIN TRAMADOL*  2  . umeclidinium-vilanterol (ANORO ELLIPTA) 62.5-25 MCG/INH AEPB Inhale 1 puff into the lungs daily. 1 each 3  . Vitamin D, Ergocalciferol, (DRISDOL) 50000 units CAPS capsule TAKE 1 CAPSULE (50,000 UNITS TOTAL) BY MOUTH ONCE A WEEK. 6 capsule 0  . zolpidem (AMBIEN) 10 MG tablet Take 1 tablet (10 mg total) by mouth at bedtime as needed for sleep. 30 tablet 3  . promethazine (PHENERGAN) 12.5 MG tablet TAKE 1 TABLET BY MOUTH EVERY 6 HOURS AS NEEDED FOR NAUSEA 60 tablet 1   No facility-administered medications prior to visit.     ROS: Review of Systems  Constitutional: Positive for fatigue. Negative for activity change, appetite  change, chills and unexpected weight change.  HENT: Negative for congestion, mouth sores and sinus pressure.   Eyes: Negative for visual disturbance.  Respiratory: Negative for cough and chest tightness.   Gastrointestinal: Negative for abdominal pain and nausea.  Genitourinary: Negative for difficulty urinating, frequency and vaginal pain.  Musculoskeletal: Positive for arthralgias and gait problem. Negative for back pain.  Skin: Negative for pallor and rash.  Neurological: Negative for dizziness, tremors, weakness, numbness and headaches.  Psychiatric/Behavioral: Negative for confusion and sleep disturbance. The patient is nervous/anxious.     Objective:  BP 136/82 (BP Location: Left Arm, Patient Position: Sitting,  Cuff Size: Normal)   Pulse 77   Temp 98.3 F (36.8 C) (Oral)   Ht 5\' 5"  (1.651 m)   Wt 165 lb (74.8 kg)   SpO2 94%   BMI 27.46 kg/m   BP Readings from Last 3 Encounters:  06/23/18 136/82  03/20/18 126/78  12/16/17 124/78    Wt Readings from Last 3 Encounters:  06/23/18 165 lb (74.8 kg)  03/20/18 167 lb (75.8 kg)  12/16/17 162 lb (73.5 kg)    Physical Exam  Constitutional: She appears well-developed. No distress.  HENT:  Head: Normocephalic.  Right Ear: External ear normal.  Left Ear: External ear normal.  Nose: Nose normal.  Mouth/Throat: Oropharynx is clear and moist.  Eyes: Pupils are equal, round, and reactive to light. Conjunctivae are normal. Right eye exhibits no discharge. Left eye exhibits no discharge.  Neck: Normal range of motion. Neck supple. No JVD present. No tracheal deviation present. No thyromegaly present.  Cardiovascular: Normal rate, regular rhythm and normal heart sounds.  Pulmonary/Chest: No stridor. No respiratory distress. She has no wheezes.  Abdominal: Soft. Bowel sounds are normal. She exhibits no distension and no mass. There is no tenderness. There is no rebound and no guarding.  Musculoskeletal: She exhibits no edema or tenderness.  Lymphadenopathy:    She has no cervical adenopathy.  Neurological: She displays normal reflexes. No cranial nerve deficit. She exhibits abnormal muscle tone. Coordination abnormal.  Skin: No rash noted. No erythema.  Psychiatric: She has a normal mood and affect. Her behavior is normal. Judgment and thought content normal.  in a w/c  Lab Results  Component Value Date   WBC 5.7 10/06/2017   HGB 10.8 (L) 10/06/2017   HCT 34.8 (L) 10/06/2017   PLT 213 10/06/2017   GLUCOSE 208 (H) 03/20/2018   CHOL 213 (H) 04/16/2017   TRIG 183.0 (H) 04/16/2017   HDL 93.40 04/16/2017   LDLDIRECT 100.8 10/26/2013   LDLCALC 83 04/16/2017   ALT 10 12/16/2017   AST 14 12/16/2017   NA 136 03/20/2018   K 5.3 (H) 03/20/2018    CL 97 03/20/2018   CREATININE 0.69 03/20/2018   BUN 14 03/20/2018   CO2 32 03/20/2018   TSH 1.92 03/20/2018   INR 1.12 11/08/2016   HGBA1C 7.5 (H) 03/20/2018    Dg Chest 2 View  Result Date: 10/03/2017 CLINICAL DATA:  Cough and confusion earlier today. Right upper chest pain. Sudden onset of aphasia. Blurry vision. Left-sided facial droop and weakness is chronic. EXAM: CHEST  2 VIEW COMPARISON:  11/08/2016 FINDINGS: Shallow inspiration. Heart size and pulmonary vascularity are normal. Calcification of the aorta. No airspace disease or consolidation in the lungs. Postoperative changes in the right humerus and left upper quadrant. Degenerative changes in the spine. IMPRESSION: No evidence of active pulmonary disease. Shallow inspiration. Aortic atherosclerosis. Electronically Signed   By: Burman Nieves  M.D.   On: 10/03/2017 19:37   Ct Head Wo Contrast  Result Date: 10/03/2017 CLINICAL DATA:  Episode of aphasia with contraction of arms around 4 p.m. today. History of seizures. EXAM: CT HEAD WITHOUT CONTRAST TECHNIQUE: Contiguous axial images were obtained from the base of the skull through the vertex without intravenous contrast. COMPARISON:  Brain MRI 03/30/2011 FINDINGS: Brain: No evidence of acute infarction, hemorrhage, hydrocephalus, extra-axial collection or mass lesion/mass effect. White matter volume loss with generalized thinning of the corpus callosum that is advanced. There is associated low-density in the bilateral cerebral white matter. Patient has history of anoxic brain injury. Vascular: Atherosclerotic calcification.  No hyperdense vessel. Skull: No acute or aggressive finding. Sinuses/Orbits: Chronic patchy appearing opacification of paranasal sinuses. The hypoplastic right sphenoid sinus is completely opacified. IMPRESSION: 1. No acute finding. 2. 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 Electronically Signed   By: Marnee SpringJonathon  Watts M.D.   On: 10/03/2017 19:32   Mr  Laqueta JeanBrain W ZOWo Contrast  Result Date: 10/04/2017 CLINICAL DATA:  Initial evaluation for acute altered mental status. EXAM: MRI HEAD WITHOUT AND WITH CONTRAST TECHNIQUE: Multiplanar, multiecho pulse sequences of the brain and surrounding structures were obtained without and with intravenous contrast. CONTRAST:  12mL MULTIHANCE GADOBENATE DIMEGLUMINE 529 MG/ML IV SOLN COMPARISON:  Prior CT from 10/03/2017. FINDINGS: Brain: Study mildly degraded by motion artifact. Diffuse prominence of the CSF containing spaces compatible with generalized cerebral atrophy. Patchy and confluent T2/FLAIR hyperintensity within the periventricular and deep white matter both cerebral hemispheres, most consistent with chronic small vessel ischemic disease, mild to moderate nature. Superimposed remote lacunar infarcts present within the bilateral periventricular white matter. No abnormal foci of restricted diffusion to suggest acute or subacute ischemia. Gray-white matter differentiation maintained. No areas of remote cortical infarction. No evidence for acute or chronic intracranial hemorrhage. No mass lesion, midline shift or mass effect. Mild ventricular prominence related global parenchymal volume loss of hydrocephalus. No extra-axial fluid collection. No abnormal enhancement. Major dural sinuses are grossly patent. Pituitary gland suprasellar region normal. Midline structures intact and normal. Vascular: Major intracranial vascular flow voids maintained at the skullbase. Skull and upper cervical spine: Craniocervical junction normal. Upper cervical spine normal. Bone marrow signal intensity within normal limits. No scalp soft tissue abnormality. Sinuses/Orbits: Globes and orbital soft tissues within normal limits. Scattered mucosal thickening within the ethmoidal air cells and maxillary sinuses. Small bilateral mastoid effusions, of doubtful significance. Inner ear structures normal. Other: None. IMPRESSION: 1. No acute intracranial  abnormality. 2. Generalized age-related cerebral atrophy with mild to moderate chronic small vessel ischemic disease. Electronically Signed   By: Rise MuBenjamin  McClintock M.D.   On: 10/04/2017 06:24    Assessment & Plan:   There are no diagnoses linked to this encounter.   No orders of the defined types were placed in this encounter.    Follow-up: No follow-ups on file.  Sonda PrimesAlex Plotnikov, MD

## 2018-06-23 NOTE — Addendum Note (Signed)
Addended by: Scarlett PrestoFRIEDENBACH, Kaleigha Chamberlin on: 06/23/2018 03:05 PM   Modules accepted: Orders

## 2018-06-23 NOTE — Assessment & Plan Note (Signed)
On B12 

## 2018-06-23 NOTE — Assessment & Plan Note (Signed)
Vit D 

## 2018-06-23 NOTE — Assessment & Plan Note (Signed)
Labs

## 2018-06-24 ENCOUNTER — Ambulatory Visit: Payer: Medicare Other | Admitting: Physical Therapy

## 2018-06-24 DIAGNOSIS — M6281 Muscle weakness (generalized): Secondary | ICD-10-CM | POA: Diagnosis not present

## 2018-06-24 DIAGNOSIS — R2681 Unsteadiness on feet: Secondary | ICD-10-CM

## 2018-06-24 DIAGNOSIS — R2689 Other abnormalities of gait and mobility: Secondary | ICD-10-CM | POA: Diagnosis not present

## 2018-06-25 ENCOUNTER — Ambulatory Visit: Payer: Medicare Other

## 2018-06-25 ENCOUNTER — Encounter: Payer: Self-pay | Admitting: Physical Therapy

## 2018-06-25 NOTE — Therapy (Signed)
[facility] [address] [place], [place], [address] Phone: [phone]   Fax:  [phone]  Physical Therapy Treatment  Patient Details  Name: [name] MRN: [id number] Date of Birth: 1951-01-17 Referring Provider: Dr. Lew Dawes   Encounter Date: 06/24/2018  PT End of Session - 06/25/18 1045    Visit Number  14    Number of Visits  17    Date for PT Re-Evaluation  07/03/18    Authorization Type  Medicare and Medicaid    Authorization Time Period  04-17-18 - 07-16-18    PT Start Time  6962    PT Stop Time  1449    PT Time Calculation (min)  46 min       Past Medical History:  Diagnosis Date  . Anoxic brain injury ([place])   . Anxiety   . Aortic atherosclerosis ([place]) 10/04/2017  . Cellulitis and abscess of other specified site 10/11/2010   Qualifier: Diagnosis of  By: Plotnikov MD, Evie Lacks   . Chronic neck pain   . Chronic pain in shoulder   . COPD (chronic obstructive pulmonary disease) ([place])   . Depression   . Fracture of left humerus 06/24/2014  . Gait disorder   . GERD (gastroesophageal reflux disease)   . Hernia of abdominal cavity   . 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 Hyperlipidemia   . LBP (low back pain)   . Neuropathy   . Osteoarthritis   . Seizure disorder ([place])   . Seizures ([place])   . Type II or unspecified type diabetes mellitus without mention of complication, not stated as uncontrolled 2010  . UTI (urinary tract infection) 10/2017  . Vitamin B 12 deficiency 2011    Past Surgical History:  Procedure Laterality Date  . COLONOSCOPY    . DIAGNOSTIC LAPAROSCOPY     PMH: Exploratory lap  . HERNIA REPAIR     incisional  . ORIF HUMERUS FRACTURE Left 06/24/2014   Procedure: LEFT OPEN REDUCTION INTERNAL FIXATION (ORIF) PROXIMAL HUMERUS FRACTURE;  Surgeon: Johnny Bridge, MD;  Location: [facility];  Service: Orthopedics;  Laterality: Left;  .  PANCREATECTOMY    . TONSILLECTOMY    . TUBAL LIGATION      There were no vitals filed for this visit.  Subjective Assessment - 06/25/18 1040    Subjective  Pt reports no changes or problems since previous visit    Patient Stated Goals  "I need to walk again"    Currently in Pain?  Yes    Pain Score  5     Pain Location  Back    Pain Orientation  Lower    Pain Descriptors / Indicators  Aching;Sore    Pain Type  Chronic pain    Pain Onset  More than a month ago    Pain Frequency  Constant                       [facility] Adult PT Treatment/Exercise - 06/25/18 0001      Transfers   Transfers  Sit to Stand    Sit to Stand  5: Supervision    Number of Reps  Other reps (comment)   3   Comments  with UE support from wheelchair to // bars       Ambulation/Gait   Ambulation/Gait  Yes    Ambulation/Gait Assistance  4: Min guard    Ambulation/Gait Assistance  Details  cues to stand erect and to stay close to RW    Ambulation Distance (Feet)  100 Feet   25' at end of session   Assistive device  Rolling walker    Gait Pattern  Decreased step length - right;Decreased step length - left;Ataxic;Trunk rotated posteriorly on right;Decreased trunk rotation    Ambulation Surface  Level;Indoor    Stairs  Yes    Stairs Assistance  4: Min guard    Stair Management Technique  Two rails;Step to pattern;Forwards    Number of Stairs  4    Height of Stairs  6      Knee/Hip Exercises: Aerobic   Stepper  SciFit level 1.5 x 5" with UE's and LE's           Balance Exercises - 06/25/18 1043      Balance Exercises: Standing   Standing Eyes Opened  Wide (BOA);Solid surface;30 secs   reaching for cones in various locations    Rockerboard  Anterior/posterior;Lateral;10 reps;UE support   inside // bars    Turning  Both;5 reps    Other Standing Exercises  pt performed tap ups to 4" step with UE support;          PT Short Term Goals - 06/10/18 2123      PT SHORT TERM GOAL #1    Title  Pt will perform HEP for standing balance with caregiver's assistance.    Status  Achieved      PT SHORT TERM GOAL #2   Title  Will assess BERG and improve score by 3 points in order to indicate decreased fall risk.      Status  On-going      PT SHORT TERM GOAL #3   Title  Pt will improve 5TSS to <12 seconds with single UE support in order to indicate improved functional strength.      Baseline  15.38 secs with 1 UE support on 04-17-18;  14.57 secs on 05-20-18    Status  Not Met      PT SHORT TERM GOAL #4   Title  Pt will improve TUG to <39 secs in order to indicate decreased fall risk.      Status  Not Met      PT SHORT TERM GOAL #5   Title  Pt will ambulate x 230' with RW with supervision, including turning around curve on track, to demonstrate improved endurance and balance with ambulation.    Baseline  115' with RW    Status  Not Met        PT Long Term Goals - 06/20/18 1123      PT LONG TERM GOAL #1   Title  Pt will perform updated HEP for balance and functional strengthening with caregiver's assistance.    Status  On-going    Target Date  07/03/18      PT LONG TERM GOAL #2   Title  Pt will improve BERG balance score by 6 points from baseline in order to indicate decreased fall risk.      Baseline  30/56 on 02/21/17, met at [facility] session, however has not made progress since half way mark.      Status  On-going    Target Date  07/03/18      PT LONG TERM GOAL #3   Title  Pt will perform TUG with RW in </= 35 secs in order to indicate decreased fall risk.      Baseline  43.84  secs with RW on 04-17-18    Status  On-going    Target Date  07/03/18      PT LONG TERM GOAL #4   Title  Pt will perform 5x sit<>stand without UE support in </= 10 secs  in order to indicate improved functional strength.      Baseline  15.38 secs with LUE support on 04-17-18    Status  On-going    Target Date  07/03/18      PT LONG TERM GOAL #5   Title  Pt will amb. 74' with RW with SBA on  even surfaces to demo increased endurance/activity tolerance.    Baseline  115' with RW on 04-17-18    Status  On-going    Target Date  07/03/18            Plan - 06/25/18 1045    Clinical Impression Statement  Pt reported fatigue in legs at end of session due to standing balance activities; pt continues to excessively weight bear on bil. UE's with less weightbearing through LE's:  pt also continues to have difficulty with weight shifting and weight bearing on LLE when stepping with RLE.  Pt is demonstrating increased standing tolerance during session.      Rehab Potential  Fair    Clinical Impairments Affecting Rehab Potential  chronicity of deficits and length of time since onset:  pt has had several OP PT admissions/course of therapy at this facility    PT Frequency  2x / week    PT Duration  8 weeks    PT Treatment/Interventions  ADLs/Self Care Home Management;Functional mobility training;Stair training;Gait training;DME Instruction;Therapeutic activities;Therapeutic exercise;Balance training;Neuromuscular re-education;Patient/family education    PT Next Visit Plan  cont gait training with RW for distance/quality, L lateral weight ; check LTG's - due 06-18-18 -- renew    PT Home Exercise Plan  standing balance exercises - pt may benefit from some PWR! exercises    Consulted and Agree with Plan of Care  Patient       Patient will benefit from skilled therapeutic intervention in order to improve the following deficits and impairments:  Abnormal gait, Decreased activity tolerance, Decreased balance, Decreased cognition, Decreased coordination, Decreased mobility, Decreased knowledge of use of DME, Decreased strength, Postural dysfunction, Pain  Visit Diagnosis: Other abnormalities of gait and mobility  Unsteadiness on feet  Muscle weakness (generalized)     Problem List Patient Active Problem List   Diagnosis Date Noted  . Weakness 10/04/2017  . Acute encephalopathy 10/04/2017   . Acute lower UTI 10/04/2017  . Aortic atherosclerosis ([place]) 10/04/2017  . Grief 09/23/2017  . Osteoporosis 04/16/2017  . Irregular heart beat 11/15/2016  . Hyperglycemia   . Oral thrush 07/18/2016  . Odynophagia 07/18/2016  . Anoxic brain injury ([place]) 12/29/2015  . Gait disorder 12/28/2015  . Well adult exam 12/08/2014  . Fracture of left humerus 06/24/2014  . Humerus fracture 06/24/2014  . Left elbow pain 11/03/2013  . Ankle ulcer ([place]) 04/02/2012  . Decubitus ulcer of coccyx 05/21/2011  . B12 deficiency 04/04/2010  . HYPERLIPIDEMIA 04/04/2010  . Anxiety state 11/14/2009  . Vitamin D deficiency 10/21/2009  . DM2 (diabetes mellitus, type 2) ([place]) 07/07/2009  . OSTEOARTHRITIS 02/25/2009  . LOW BACK PAIN 02/25/2009  . TOBACCO USER 01/13/2009  . DEPRESSION 02/26/2007  . PAIN, CHRONIC NEC 02/26/2007  . Venous (peripheral) insufficiency 02/26/2007  . GERD 02/26/2007  . HERNIA, INCISIONAL 02/26/2007  . SHOULDER PAIN, LEFT 02/26/2007  . 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 INSOMNIA 02/26/2007  . Abnormality of gait 02/26/2007  . LEG EDEMA, CHRONIC 02/26/2007    Alda Lea, PT 06/25/2018, 10:50 AM  [facility] [address] [place], [place], [address] Phone: [phone]   Fax:  [phone]  Name: [name] MRN: [id number] Date of Birth: Aug 22, 1951

## 2018-06-26 ENCOUNTER — Ambulatory Visit: Payer: Medicare Other | Admitting: Physical Therapy

## 2018-06-26 DIAGNOSIS — R2681 Unsteadiness on feet: Secondary | ICD-10-CM | POA: Diagnosis not present

## 2018-06-26 DIAGNOSIS — M6281 Muscle weakness (generalized): Secondary | ICD-10-CM | POA: Diagnosis not present

## 2018-06-26 DIAGNOSIS — R2689 Other abnormalities of gait and mobility: Secondary | ICD-10-CM | POA: Diagnosis not present

## 2018-06-27 ENCOUNTER — Encounter: Payer: Self-pay | Admitting: Physical Therapy

## 2018-06-27 NOTE — Therapy (Signed)
[facility] [address] [place], [place], [address] Phone: [phone]   Fax:  [phone]  Physical Therapy Treatment  Patient Details  Name: [name] MRN: [id number] Date of Birth: 1951-08-22 Referring Provider (PT): Dr. Lew Dawes   Encounter Date: 06/26/2018  PT End of Session - 06/27/18 1323    Visit Number  15    Number of Visits  17    Date for PT Re-Evaluation  07/03/18    Authorization Type  Medicare and Medicaid    Authorization Time Period  04-17-18 - 07-16-18    PT Start Time  1408    PT Stop Time  1450    PT Time Calculation (min)  42 min       Past Medical History:  Diagnosis Date  . Anoxic brain injury ([place])   . Anxiety   . Aortic atherosclerosis ([place]) 10/04/2017  . Cellulitis and abscess of other specified site 10/11/2010   Qualifier: Diagnosis of  By: Plotnikov MD, Evie Lacks   . Chronic neck pain   . Chronic pain in shoulder   . COPD (chronic obstructive pulmonary disease) ([place])   . Depression   . Fracture of left humerus 06/24/2014  . Gait disorder   . GERD (gastroesophageal reflux disease)   . Hernia of abdominal cavity   . 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 Hyperlipidemia   . LBP (low back pain)   . Neuropathy   . Osteoarthritis   . Seizure disorder ([place])   . Seizures ([place])   . Type II or unspecified type diabetes mellitus without mention of complication, not stated as uncontrolled 2010  . UTI (urinary tract infection) 10/2017  . Vitamin B 12 deficiency 2011    Past Surgical History:  Procedure Laterality Date  . COLONOSCOPY    . DIAGNOSTIC LAPAROSCOPY     PMH: Exploratory lap  . HERNIA REPAIR     incisional  . ORIF HUMERUS FRACTURE Left 06/24/2014   Procedure: LEFT OPEN REDUCTION INTERNAL FIXATION (ORIF) PROXIMAL HUMERUS FRACTURE;  Surgeon: Johnny Bridge, MD;  Location: [facility];  Service: Orthopedics;  Laterality: Left;  .  PANCREATECTOMY    . TONSILLECTOMY    . TUBAL LIGATION      There were no vitals filed for this visit.  Subjective Assessment - 06/27/18 1305    Subjective  Pt states she is is upset today - says her niece left to go back to [place]    Patient Stated Goals  "I need to walk again"    Currently in Pain?  Yes    Pain Score  5     Pain Location  Back    Pain Orientation  Lower    Pain Descriptors / Indicators  Aching;Sore    Pain Type  Chronic pain    Pain Onset  More than a month ago    Pain Frequency  Constant                       [facility] Adult PT Treatment/Exercise - 06/27/18 0001      Transfers   Transfers  Sit to Stand    Sit to Stand  5: Supervision    Number of Reps  Other reps (comment)   3   Comments  with UE support - wheelchair to // bars       Ambulation/Gait   Ambulation/Gait  Yes    Ambulation/Gait Assistance  4: Min guard    Ambulation/Gait Assistance Details  cues to stand erect and to stay inside RW    Ambulation Distance (Feet)  75 Feet   50' 2nd rep; 25' at end of session   Assistive device  Rolling walker    Gait Pattern  Decreased step length - right;Decreased step length - left;Ataxic;Trunk rotated posteriorly on right;Decreased trunk rotation    Ambulation Surface  Level;Indoor    Stairs  Yes    Stairs Assistance  4: Min guard    Stair Management Technique  Two rails;Step to pattern;Forwards    Number of Stairs  4    Height of Stairs  6    Gait Comments  2 reps gait training inside // bars - forward and then backward insdie // bars with UE support           Balance Exercises - 06/27/18 1318      Balance Exercises: Standing   Standing Eyes Opened  Wide (BOA);Solid surface;2 reps;10 secs    Rockerboard  Anterior/posterior;Lateral;10 reps;UE support   inside // bars    Other Standing Exercises  pt performed stepping over and back of balance beam ( black styrofoam bolster) inside // bars  with bil. UE support 5 reps each leg                            Pt performed standing on blue balance beam - perpendicular - inside // bars with UE support; pt performed tap downs To floor - same leg 5 reps each; had difficulty with attempting to alternate tapping down with each foot     PT Short Term Goals - 06/10/18 2123      PT SHORT TERM GOAL #1   Title  Pt will perform HEP for standing balance with caregiver's assistance.    Status  Achieved      PT SHORT TERM GOAL #2   Title  Will assess BERG and improve score by 3 points in order to indicate decreased fall risk.      Status  On-going      PT SHORT TERM GOAL #3   Title  Pt will improve 5TSS to <12 seconds with single UE support in order to indicate improved functional strength.      Baseline  15.38 secs with 1 UE support on 04-17-18;  14.57 secs on 05-20-18    Status  Not Met      PT SHORT TERM GOAL #4   Title  Pt will improve TUG to <39 secs in order to indicate decreased fall risk.      Status  Not Met      PT SHORT TERM GOAL #5   Title  Pt will ambulate x 230' with RW with supervision, including turning around curve on track, to demonstrate improved endurance and balance with ambulation.    Baseline  115' with RW    Status  Not Met        PT Long Term Goals - 06/20/18 1123      PT LONG TERM GOAL #1   Title  Pt will perform updated HEP for balance and functional strengthening with caregiver's assistance.    Status  On-going    Target Date  07/03/18      PT LONG TERM GOAL #2   Title  Pt will improve BERG balance score by 6 points from baseline in order to indicate decreased fall risk.  Baseline  30/56 on 02/21/17, met at [facility] session, however has not made progress since half way mark.      Status  On-going    Target Date  07/03/18      PT LONG TERM GOAL #3   Title  Pt will perform TUG with RW in </= 35 secs in order to indicate decreased fall risk.      Baseline  43.84 secs with RW on 04-17-18    Status  On-going    Target Date  07/03/18      PT LONG  TERM GOAL #4   Title  Pt will perform 5x sit<>stand without UE support in </= 10 secs  in order to indicate improved functional strength.      Baseline  15.38 secs with LUE support on 04-17-18    Status  On-going    Target Date  07/03/18      PT LONG TERM GOAL #5   Title  Pt will amb. 64' with RW with SBA on even surfaces to demo increased endurance/activity tolerance.    Baseline  115' with RW on 04-17-18    Status  On-going    Target Date  07/03/18            Plan - 06/27/18 1324    Clinical Impression Statement  Pt reports fatigue in legs with ambulation after performing standing balance activities in first 1/2 of session; pt's balance is improving but pt continues to have difficulty with turning toward left side and also with weight shifting onto LLE     Rehab Potential  Fair    Clinical Impairments Affecting Rehab Potential  chronicity of deficits and length of time since onset:  pt has had several OP PT admissions/course of therapy at this facility    PT Frequency  2x / week    PT Duration  8 weeks    PT Treatment/Interventions  ADLs/Self Care Home Management;Functional mobility training;Stair training;Gait training;DME Instruction;Therapeutic activities;Therapeutic exercise;Balance training;Neuromuscular re-education;Patient/family education    PT Next Visit Plan  cont gait training with RW for distance/quality, L lateral weight ; check LTG's - due 06-18-18 -- renew    PT Home Exercise Plan  standing balance exercises - pt may benefit from some PWR! exercises    Consulted and Agree with Plan of Care  Patient       Patient will benefit from skilled therapeutic intervention in order to improve the following deficits and impairments:  Abnormal gait, Decreased activity tolerance, Decreased balance, Decreased cognition, Decreased coordination, Decreased mobility, Decreased knowledge of use of DME, Decreased strength, Postural dysfunction, Pain  Visit Diagnosis: Other abnormalities  of gait and mobility  Unsteadiness on feet  Muscle weakness (generalized)     Problem List Patient Active Problem List   Diagnosis Date Noted  . Weakness 10/04/2017  . Acute encephalopathy 10/04/2017  . Acute lower UTI 10/04/2017  . Aortic atherosclerosis ([place]) 10/04/2017  . Grief 09/23/2017  . Osteoporosis 04/16/2017  . Irregular heart beat 11/15/2016  . Hyperglycemia   . Oral thrush 07/18/2016  . Odynophagia 07/18/2016  . Anoxic brain injury ([place]) 12/29/2015  . Gait disorder 12/28/2015  . Well adult exam 12/08/2014  . Fracture of left humerus 06/24/2014  . Humerus fracture 06/24/2014  . Left elbow pain 11/03/2013  . Ankle ulcer ([place]) 04/02/2012  . Decubitus ulcer of coccyx 05/21/2011  . B12 deficiency 04/04/2010  . HYPERLIPIDEMIA 04/04/2010  . Anxiety state 11/14/2009  . Vitamin D deficiency 10/21/2009  . DM2 (diabetes  mellitus, type 2) ([place]) 07/07/2009  . OSTEOARTHRITIS 02/25/2009  . LOW BACK PAIN 02/25/2009  . TOBACCO USER 01/13/2009  . DEPRESSION 02/26/2007  . PAIN, CHRONIC NEC 02/26/2007  . Venous (peripheral) insufficiency 02/26/2007  . GERD 02/26/2007  . HERNIA, INCISIONAL 02/26/2007  . SHOULDER PAIN, LEFT 02/26/2007  . 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 INSOMNIA 02/26/2007  . Abnormality of gait 02/26/2007  . LEG EDEMA, CHRONIC 02/26/2007    Alda Lea, PT 06/27/2018, 1:31 PM  [facility] [address] [place], [place], [address] Phone: [phone]   Fax:  [phone]  Name: BELLATRIX DEVONSHIRE MRN: [id number] Date of Birth: 1951/01/03

## 2018-07-01 ENCOUNTER — Ambulatory Visit: Payer: Medicare Other | Attending: Internal Medicine | Admitting: Physical Therapy

## 2018-07-01 DIAGNOSIS — R278 Other lack of coordination: Secondary | ICD-10-CM | POA: Insufficient documentation

## 2018-07-01 DIAGNOSIS — R2681 Unsteadiness on feet: Secondary | ICD-10-CM | POA: Diagnosis not present

## 2018-07-01 DIAGNOSIS — R2689 Other abnormalities of gait and mobility: Secondary | ICD-10-CM | POA: Insufficient documentation

## 2018-07-01 DIAGNOSIS — M6281 Muscle weakness (generalized): Secondary | ICD-10-CM | POA: Diagnosis not present

## 2018-07-02 ENCOUNTER — Encounter: Payer: Self-pay | Admitting: Physical Therapy

## 2018-07-02 NOTE — Therapy (Signed)
[facility] [address] [place], [place], [address] Phone: [phone]   Fax:  [phone]  Physical Therapy Treatment  Patient Details  Name: [name] MRN: [id number] Date of Birth: 09-19-1951 Referring Provider (PT): Dr. Lew Dawes   Encounter Date: 07/01/2018  PT End of Session - 07/02/18 1046    Visit Number  16    Number of Visits  17    Date for PT Re-Evaluation  07/03/18    Authorization Type  Medicare and Medicaid    Authorization Time Period  04-17-18 - 07-16-18    PT Start Time  1405    PT Stop Time  1450    PT Time Calculation (min)  45 min       Past Medical History:  Diagnosis Date  . Anoxic brain injury ([place])   . Anxiety   . Aortic atherosclerosis ([place]) 10/04/2017  . Cellulitis and abscess of other specified site 10/11/2010   Qualifier: Diagnosis of  By: Plotnikov MD, Evie Lacks   . Chronic neck pain   . Chronic pain in shoulder   . COPD (chronic obstructive pulmonary disease) ([place])   . Depression   . Fracture of left humerus 06/24/2014  . Gait disorder   . GERD (gastroesophageal reflux disease)   . Hernia of abdominal cavity   . 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 Hyperlipidemia   . LBP (low back pain)   . Neuropathy   . Osteoarthritis   . Seizure disorder ([place])   . Seizures ([place])   . Type II or unspecified type diabetes mellitus without mention of complication, not stated as uncontrolled 2010  . UTI (urinary tract infection) 10/2017  . Vitamin B 12 deficiency 2011    Past Surgical History:  Procedure Laterality Date  . COLONOSCOPY    . DIAGNOSTIC LAPAROSCOPY     PMH: Exploratory lap  . HERNIA REPAIR     incisional  . ORIF HUMERUS FRACTURE Left 06/24/2014   Procedure: LEFT OPEN REDUCTION INTERNAL FIXATION (ORIF) PROXIMAL HUMERUS FRACTURE;  Surgeon: Johnny Bridge, MD;  Location: [facility];  Service: Orthopedics;  Laterality: Left;  .  PANCREATECTOMY    . TONSILLECTOMY    . TUBAL LIGATION      There were no vitals filed for this visit.  Subjective Assessment - 07/02/18 1040    Subjective  Pt's caregiver states pt has started walking more in her home - has been using walker to get from bedroom to bathroom and using wheelchair less    Patient is accompained by:  --   CNA, Coralyn Mark   Patient Stated Goals  "I need to walk again"    Currently in Pain?  Yes    Pain Score  4     Pain Location  Back    Pain Orientation  Lower    Pain Descriptors / Indicators  Aching;Sore    Pain Type  Chronic pain    Pain Onset  More than a month ago    Pain Frequency  Constant                       [facility] Adult PT Treatment/Exercise - 07/02/18 0001      Transfers   Transfers  Sit to Stand    Sit to Stand  5: Supervision    Number of Reps  10 reps   5   Comments  without UE support  Ambulation/Gait   Ambulation/Gait  Yes    Ambulation/Gait Assistance  4: Min guard    Ambulation/Gait Assistance Details  cues to stand erect and to stay close inside RW    Ambulation Distance (Feet)  230 Feet   2 laps nonstop   Assistive device  Rolling walker    Gait Pattern  Decreased step length - right;Decreased step length - left;Ataxic;Trunk rotated posteriorly on right;Decreased trunk rotation    Ambulation Surface  Level;Indoor      Knee/Hip Exercises: Aerobic   Stepper  SciFit level 1.5 x 5" with UE's and LE's        Gait; practiced turning and negotiating RW in tight spaces - hula hoop placed on floor for practice with turning and also used PVC square for stepping in and out with use of RW; pt performed fig. 8 turn around hula hoop and stepped in/out of pvc square X 1 rep with fatigue reported after this activity (1 turn of walking around hula hoop & then stepping in/out of square) - needed seated Rest break after this activity   NeuroRe-ed: pt performed stepping over and back of balance beam with bil. UE support 5  reps each leg Stood unsupported at mat - reached for 5 bean bags alternating - on each side behind her - and performed tossing In basket without UE support with CGA for safety      PT Short Term Goals - 06/10/18 2123      PT SHORT TERM GOAL #1   Title  Pt will perform HEP for standing balance with caregiver's assistance.    Status  Achieved      PT SHORT TERM GOAL #2   Title  Will assess BERG and improve score by 3 points in order to indicate decreased fall risk.      Status  On-going      PT SHORT TERM GOAL #3   Title  Pt will improve 5TSS to <12 seconds with single UE support in order to indicate improved functional strength.      Baseline  15.38 secs with 1 UE support on 04-17-18;  14.57 secs on 05-20-18    Status  Not Met      PT SHORT TERM GOAL #4   Title  Pt will improve TUG to <39 secs in order to indicate decreased fall risk.      Status  Not Met      PT SHORT TERM GOAL #5   Title  Pt will ambulate x 230' with RW with supervision, including turning around curve on track, to demonstrate improved endurance and balance with ambulation.    Baseline  115' with RW    Status  Not Met        PT Long Term Goals - 07/02/18 1042      PT LONG TERM GOAL #1   Title  Pt will perform updated HEP for balance and functional strengthening with caregiver's assistance.    Baseline  met 07-01-18    Status  Achieved      PT LONG TERM GOAL #2   Title  Pt will improve BERG balance score by 6 points from baseline in order to indicate decreased fall risk.        PT LONG TERM GOAL #3   Title  Pt will perform TUG with RW in </= 35 secs in order to indicate decreased fall risk.      Baseline  43.84 secs with RW on 04-17-18        PT LONG TERM GOAL #4   Title  Pt will perform 5x sit<>stand without UE support in </= 10 secs  in order to indicate improved functional strength.        PT LONG TERM GOAL #5   Title  Pt will amb. 25' with RW with SBA on even surfaces to demo increased  endurance/activity tolerance.    Baseline  230' with RW on 07-01-18    Status  Not Met            Plan - 07/02/18 1046    Clinical Impression Statement  Pt is improving with amb. distance as pt amb. 230' nonstop at beginning of PT session without requiring seated rest break.  Pt continues to have difficulty turning toward left side and has difficulty with motor planning with use of RW to negotiate tight spaces.  Pt is improving with maintaining static standing balance.  Pt will benefit from continued skilled PT to improve functional mobility.      Rehab Potential  Fair    Clinical Impairments Affecting Rehab Potential  chronicity of deficits and length of time since onset:  pt has had several OP PT admissions/course of therapy at this facility    PT Frequency  2x / week    PT Duration  8 weeks    PT Treatment/Interventions  ADLs/Self Care Home Management;Functional mobility training;Stair training;Gait training;DME Instruction;Therapeutic activities;Therapeutic exercise;Balance training;Neuromuscular re-education;Patient/family education    PT Next Visit Plan  cont gait training with RW for distance/quality, L lateral weight ; check LTG's - due 06-18-18 -- renew    PT Home Exercise Plan  standing balance exercises - pt may benefit from some PWR! exercises       Patient will benefit from skilled therapeutic intervention in order to improve the following deficits and impairments:  Abnormal gait, Decreased activity tolerance, Decreased balance, Decreased cognition, Decreased coordination, Decreased mobility, Decreased knowledge of use of DME, Decreased strength, Postural dysfunction, Pain  Visit Diagnosis: Other abnormalities of gait and mobility  Unsteadiness on feet  Muscle weakness (generalized)     Problem List Patient Active Problem List   Diagnosis Date Noted  . Weakness 10/04/2017  . Acute encephalopathy 10/04/2017  . Acute lower UTI 10/04/2017  . Aortic atherosclerosis  ([place]) 10/04/2017  . Grief 09/23/2017  . Osteoporosis 04/16/2017  . Irregular heart beat 11/15/2016  . Hyperglycemia   . Oral thrush 07/18/2016  . Odynophagia 07/18/2016  . Anoxic brain injury ([place]) 12/29/2015  . Gait disorder 12/28/2015  . Well adult exam 12/08/2014  . Fracture of left humerus 06/24/2014  . Humerus fracture 06/24/2014  . Left elbow pain 11/03/2013  . Ankle ulcer ([place]) 04/02/2012  . Decubitus ulcer of coccyx 05/21/2011  . B12 deficiency 04/04/2010  . HYPERLIPIDEMIA 04/04/2010  . Anxiety state 11/14/2009  . Vitamin D deficiency 10/21/2009  . DM2 (diabetes mellitus, type 2) ([place]) 07/07/2009  . OSTEOARTHRITIS 02/25/2009  . LOW BACK PAIN 02/25/2009  . TOBACCO USER 01/13/2009  . DEPRESSION 02/26/2007  . PAIN, CHRONIC NEC 02/26/2007  . Venous (peripheral) insufficiency 02/26/2007  . GERD 02/26/2007  . HERNIA, INCISIONAL 02/26/2007  . SHOULDER PAIN, LEFT 02/26/2007  . 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 INSOMNIA 02/26/2007  . Abnormality of gait 02/26/2007  . LEG EDEMA, CHRONIC 02/26/2007    Alda Lea, PT 07/02/2018, 10:51 AM  [facility] [address] [place], [place], [address] Phone: [phone]   Fax:  [phone]  Name: Ayisha D Wall MRN: [id number] Date of Birth: 12/10/1950   

## 2018-07-03 ENCOUNTER — Ambulatory Visit: Payer: Medicare Other | Admitting: Physical Therapy

## 2018-07-03 DIAGNOSIS — R278 Other lack of coordination: Secondary | ICD-10-CM

## 2018-07-03 DIAGNOSIS — R2681 Unsteadiness on feet: Secondary | ICD-10-CM

## 2018-07-03 DIAGNOSIS — M6281 Muscle weakness (generalized): Secondary | ICD-10-CM

## 2018-07-03 DIAGNOSIS — R2689 Other abnormalities of gait and mobility: Secondary | ICD-10-CM

## 2018-07-04 NOTE — Therapy (Signed)
[facility] [address] [place], [place], [address] Phone: [phone]   Fax:  [phone]  Physical Therapy Treatment  Patient Details  Name: [name] MRN: [id number] Date of Birth: Feb 18, 1951 Referring Provider (PT): Dr. Lew Dawes   Encounter Date: 07/03/2018  PT End of Session - 07/04/18 1723    Visit Number  17    Number of Visits  25    Date for PT Re-Evaluation  08/15/18   2 weeks extended due to lack of schedule availability with primary PT   Authorization Type  Medicare and Medicaid    Authorization Time Period  04-17-18 - 07-16-18; 07-03-18 - 09-02-18    PT Start Time  1401    PT Stop Time  1445    PT Time Calculation (min)  44 min       Past Medical History:  Diagnosis Date  . Anoxic brain injury ([place])   . Anxiety   . Aortic atherosclerosis ([place]) 10/04/2017  . Cellulitis and abscess of other specified site 10/11/2010   Qualifier: Diagnosis of  By: Plotnikov MD, Evie Lacks   . Chronic neck pain   . Chronic pain in shoulder   . COPD (chronic obstructive pulmonary disease) ([place])   . Depression   . Fracture of left humerus 06/24/2014  . Gait disorder   . GERD (gastroesophageal reflux disease)   . Hernia of abdominal cavity   . 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 Hyperlipidemia   . LBP (low back pain)   . Neuropathy   . Osteoarthritis   . Seizure disorder ([place])   . Seizures ([place])   . Type II or unspecified type diabetes mellitus without mention of complication, not stated as uncontrolled 2010  . UTI (urinary tract infection) 10/2017  . Vitamin B 12 deficiency 2011    Past Surgical History:  Procedure Laterality Date  . COLONOSCOPY    . DIAGNOSTIC LAPAROSCOPY     PMH: Exploratory lap  . HERNIA REPAIR     incisional  . ORIF HUMERUS FRACTURE Left 06/24/2014   Procedure: LEFT OPEN REDUCTION INTERNAL FIXATION (ORIF) PROXIMAL HUMERUS FRACTURE;  Surgeon:  Johnny Bridge, MD;  Location: [facility];  Service: Orthopedics;  Laterality: Left;  . PANCREATECTOMY    . TONSILLECTOMY    . TUBAL LIGATION      There were no vitals filed for this visit.  Subjective Assessment - 07/04/18 1721    Subjective  Pt states she is doing well today - plans on going shopping at [facility] today after PT session    Patient Stated Goals  "I need to walk again"    Currently in Pain?  Yes    Pain Location  Back    Pain Orientation  Lower    Pain Descriptors / Indicators  Aching;Sore    Pain Type  Chronic pain    Pain Onset  More than a month ago    Pain Frequency  Constant                       [facility] Adult PT Treatment/Exercise - 07/04/18 0001      Transfers   Transfers  Sit to Stand    Sit to Stand  5: Supervision    Five time sit to stand comments   12.37   without UE support   Number of Reps  Other reps (comment)   5   Comments  without UE support      Ambulation/Gait   Ambulation/Gait  Yes    Ambulation/Gait Assistance  4: Min guard    Ambulation/Gait Assistance Details  cues to stand erect     Ambulation Distance (Feet)  350 Feet    Assistive device  Rolling walker    Gait Pattern  Decreased step length - right;Decreased step length - left;Ataxic;Trunk rotated posteriorly on right;Decreased trunk rotation    Ambulation Surface  Level;Indoor      Standardized Balance Assessment   Standardized Balance Assessment  Timed Up and Go Test      Timed Up and Go Test   TUG  Normal TUG    Normal TUG (seconds)  43.16   with RW     Knee/Hip Exercises: Aerobic   Stepper  SciFit level 1.5 x 5" with UE's and LE's                PT Short Term Goals - 06/10/18 2123      PT SHORT TERM GOAL #1   Title  Pt will perform HEP for standing balance with caregiver's assistance.    Status  Achieved      PT SHORT TERM GOAL #2   Title  Will assess BERG and improve score by 3 points in order to indicate decreased fall risk.      Status   On-going      PT SHORT TERM GOAL #3   Title  Pt will improve 5TSS to <12 seconds with single UE support in order to indicate improved functional strength.      Baseline  15.38 secs with 1 UE support on 04-17-18;  14.57 secs on 05-20-18    Status  Not Met      PT SHORT TERM GOAL #4   Title  Pt will improve TUG to <39 secs in order to indicate decreased fall risk.      Status  Not Met      PT SHORT TERM GOAL #5   Title  Pt will ambulate x 230' with RW with supervision, including turning around curve on track, to demonstrate improved endurance and balance with ambulation.    Baseline  115' with RW    Status  Not Met        PT Long Term Goals - 07/03/18 1425      PT LONG TERM GOAL #1   Title  Pt will perform updated HEP for balance and functional strengthening with caregiver's assistance.    Status  Achieved      PT LONG TERM GOAL #2   Title  Pt will improve BERG balance score by 6 points from baseline in order to indicate decreased fall risk.      Time  4    Period  Weeks    Status  On-going    Target Date  08/15/18      PT LONG TERM GOAL #3   Title  Pt will perform TUG with RW in </= 35 secs in order to indicate decreased fall risk.      Baseline  43.84 secs with RW on 04-17-18; 43.16 on 07-03-18    Time  4    Period  Weeks    Status  On-going    Target Date  08/15/18      PT LONG TERM GOAL #4   Title  Pt will perform 5x sit<>stand without UE support in </= 10 secs  in order to indicate improved functional strength.  Baseline  15.38 secs with LUE support on 04-17-18;  12.37 secs on 07-03-18    Time  4    Period  Weeks    Status  On-going    Target Date  08/15/18      PT LONG TERM GOAL #5   Title  Pt will amb. 36' with RW with SBA on even surfaces to demo increased endurance/activity tolerance.    Baseline  345' with RW with SBA - 07-03-18    Time  4    Period  Weeks    Status  Achieved      PT LONG TERM GOAL #6   Title  Pt will amb. 4 laps (450') with RW without  requiring seated rest period to demo increased endurance.      Time  4    Period  Weeks    Status  New    Target Date  08/15/18            Plan - 07/04/18 1732    Clinical Impression Statement  Pt has met LTG's #1 and 5; LTG #4 partially met as pt has improved with 5 times sit to stand transfer without UE support with score 12.37 secs but not fully met stated goal of </= 10 secs.  LTG #2 is ongoing as TUG score remains grossly same as pt has much difficulty with turning and with performing transitional movements.  Pt will benefit from additional 4 weeks of PT - pt requests continuation of skilled PT services.      Rehab Potential  Fair    Clinical Impairments Affecting Rehab Potential  chronicity of deficits and length of time since onset:  pt has had several OP PT admissions/course of therapy at this facility    PT Frequency  2x / week    PT Duration  8 weeks    PT Treatment/Interventions  ADLs/Self Care Home Management;Functional mobility training;Stair training;Gait training;DME Instruction;Therapeutic activities;Therapeutic exercise;Balance training;Neuromuscular re-education;Patient/family education    PT Next Visit Plan  cont gait training with RW for distance/quality, L lateral weight ; renewal completed 07-04-18    PT Home Exercise Plan  standing balance exercises - pt may benefit from some PWR! exercises    Consulted and Agree with Plan of Care  Patient       Patient will benefit from skilled therapeutic intervention in order to improve the following deficits and impairments:  Abnormal gait, Decreased activity tolerance, Decreased balance, Decreased cognition, Decreased coordination, Decreased mobility, Decreased knowledge of use of DME, Decreased strength, Postural dysfunction, Pain  Visit Diagnosis: Other abnormalities of gait and mobility - Plan: PT plan of care cert/re-cert  Unsteadiness on feet - Plan: PT plan of care cert/re-cert  Muscle weakness (generalized) - Plan:  PT plan of care cert/re-cert  Other lack of coordination - Plan: PT plan of care cert/re-cert     Problem List Patient Active Problem List   Diagnosis Date Noted  . Weakness 10/04/2017  . Acute encephalopathy 10/04/2017  . Acute lower UTI 10/04/2017  . Aortic atherosclerosis ([place]) 10/04/2017  . Grief 09/23/2017  . Osteoporosis 04/16/2017  . Irregular heart beat 11/15/2016  . Hyperglycemia   . Oral thrush 07/18/2016  . Odynophagia 07/18/2016  . Anoxic brain injury ([place]) 12/29/2015  . Gait disorder 12/28/2015  . Well adult exam 12/08/2014  . Fracture of left humerus 06/24/2014  . Humerus fracture 06/24/2014  . Left elbow pain 11/03/2013  . Ankle ulcer ([place]) 04/02/2012  . Decubitus ulcer of coccyx 05/21/2011  .  B12 deficiency 04/04/2010  . HYPERLIPIDEMIA 04/04/2010  . Anxiety state 11/14/2009  . Vitamin D deficiency 10/21/2009  . DM2 (diabetes mellitus, type 2) ([place]) 07/07/2009  . OSTEOARTHRITIS 02/25/2009  . LOW BACK PAIN 02/25/2009  . TOBACCO USER 01/13/2009  . DEPRESSION 02/26/2007  . PAIN, CHRONIC NEC 02/26/2007  . Venous (peripheral) insufficiency 02/26/2007  . GERD 02/26/2007  . HERNIA, INCISIONAL 02/26/2007  . SHOULDER PAIN, LEFT 02/26/2007  . 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 INSOMNIA 02/26/2007  . Abnormality of gait 02/26/2007  . LEG EDEMA, CHRONIC 02/26/2007    Alda Lea, PT 07/04/2018, 5:41 PM  [facility] [address] [place], [place], [address] Phone: [phone]   Fax:  [phone]  Name: [name] MRN: [id number] Date of Birth: [birth year]

## 2018-07-10 DIAGNOSIS — M79672 Pain in left foot: Secondary | ICD-10-CM | POA: Diagnosis not present

## 2018-07-10 DIAGNOSIS — G894 Chronic pain syndrome: Secondary | ICD-10-CM | POA: Diagnosis not present

## 2018-07-10 DIAGNOSIS — M47816 Spondylosis without myelopathy or radiculopathy, lumbar region: Secondary | ICD-10-CM | POA: Diagnosis not present

## 2018-07-10 DIAGNOSIS — Z79891 Long term (current) use of opiate analgesic: Secondary | ICD-10-CM | POA: Diagnosis not present

## 2018-07-22 ENCOUNTER — Ambulatory Visit: Payer: Medicare Other | Admitting: Physical Therapy

## 2018-07-22 DIAGNOSIS — M6281 Muscle weakness (generalized): Secondary | ICD-10-CM | POA: Diagnosis not present

## 2018-07-22 DIAGNOSIS — R2681 Unsteadiness on feet: Secondary | ICD-10-CM | POA: Diagnosis not present

## 2018-07-22 DIAGNOSIS — R278 Other lack of coordination: Secondary | ICD-10-CM | POA: Diagnosis not present

## 2018-07-22 DIAGNOSIS — R2689 Other abnormalities of gait and mobility: Secondary | ICD-10-CM

## 2018-07-23 ENCOUNTER — Encounter: Payer: Self-pay | Admitting: Physical Therapy

## 2018-07-23 NOTE — Therapy (Signed)
[facility] [address] [place], [place], [address] Phone: [phone]   Fax:  [phone]  Physical Therapy Treatment  Patient Details  Name: [name] MRN: [id number] Date of Birth: January 11, 1951 Referring Provider (PT): Dr. Lew Dawes   Encounter Date: 07/22/2018  PT End of Session - 07/23/18 1940    Visit Number  18    Number of Visits  25    Date for PT Re-Evaluation  08/15/18    Authorization Type  Medicare and Medicaid    Authorization Time Period  04-17-18 - 07-16-18; 07-03-18 - 09-02-18    PT Start Time  5400    PT Stop Time  1401    PT Time Calculation (min)  44 min       Past Medical History:  Diagnosis Date  . Anoxic brain injury ([place])   . Anxiety   . Aortic atherosclerosis ([place]) 10/04/2017  . Cellulitis and abscess of other specified site 10/11/2010   Qualifier: Diagnosis of  By: Plotnikov MD, Evie Lacks   . Chronic neck pain   . Chronic pain in shoulder   . COPD (chronic obstructive pulmonary disease) ([place])   . Depression   . Fracture of left humerus 06/24/2014  . Gait disorder   . GERD (gastroesophageal reflux disease)   . Hernia of abdominal cavity   . 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 Hyperlipidemia   . LBP (low back pain)   . Neuropathy   . Osteoarthritis   . Seizure disorder ([place])   . Seizures ([place])   . Type II or unspecified type diabetes mellitus without mention of complication, not stated as uncontrolled 2010  . UTI (urinary tract infection) 10/2017  . Vitamin B 12 deficiency 2011    Past Surgical History:  Procedure Laterality Date  . COLONOSCOPY    . DIAGNOSTIC LAPAROSCOPY     PMH: Exploratory lap  . HERNIA REPAIR     incisional  . ORIF HUMERUS FRACTURE Left 06/24/2014   Procedure: LEFT OPEN REDUCTION INTERNAL FIXATION (ORIF) PROXIMAL HUMERUS FRACTURE;  Surgeon: Johnny Bridge, MD;  Location: [facility];  Service: Orthopedics;   Laterality: Left;  . PANCREATECTOMY    . TONSILLECTOMY    . TUBAL LIGATION      There were no vitals filed for this visit.  Subjective Assessment - 07/23/18 1934    Subjective  Pt states she was diagnosed with plantar fasciitis in Lt foot a couple of weeks ago -foot has been really sore     Patient is accompained by:  --   CNA, Terry   Patient Stated Goals  "I need to walk again"    Currently in Pain?  Yes    Pain Score  7     Pain Location  Back    Pain Orientation  Lower    Pain Descriptors / Indicators  Aching;Sore    Pain Type  Chronic pain    Pain Onset  More than a month ago    Pain Frequency  Constant                       [facility] Adult PT Treatment/Exercise - 07/23/18 0001      Transfers   Transfers  Sit to Stand    Sit to Stand  5: Supervision    Number of Reps  Other reps (comment)   5   Comments  without UE support  Ambulation/Gait   Ambulation/Gait  Yes    Ambulation/Gait Assistance  4: Min guard    Ambulation/Gait Assistance Details  cues to stay close inside RW    Ambulation Distance (Feet)  350 Feet    Assistive device  Rolling walker    Gait Pattern  Decreased step length - right;Decreased step length - left;Ataxic;Trunk rotated posteriorly on right;Decreased trunk rotation    Ambulation Surface  Level;Indoor      Knee/Hip Exercises: Aerobic   Stepper  SciFit level 2.0 x 5" with UE's and LE's         Pt sat on physioball - gentle bouncing for pelvic stabilization; lifting opposite UE/LE 3 reps each side for 5 sec hold with mod  Assist for balance   Balance Exercises - 07/23/18 1939      Balance Exercises: Standing   Stepping Strategy  Anterior;Lateral;5 reps   with UE support   Rockerboard  Anterior/posterior;Lateral;10 reps;UE support   inside // bars    Sidestepping  Upper extremity support;2 reps          PT Short Term Goals - 06/10/18 2123      PT SHORT TERM GOAL #1   Title  Pt will perform HEP for standing  balance with caregiver's assistance.    Status  Achieved      PT SHORT TERM GOAL #2   Title  Will assess BERG and improve score by 3 points in order to indicate decreased fall risk.      Status  On-going      PT SHORT TERM GOAL #3   Title  Pt will improve 5TSS to <12 seconds with single UE support in order to indicate improved functional strength.      Baseline  15.38 secs with 1 UE support on 04-17-18;  14.57 secs on 05-20-18    Status  Not Met      PT SHORT TERM GOAL #4   Title  Pt will improve TUG to <39 secs in order to indicate decreased fall risk.      Status  Not Met      PT SHORT TERM GOAL #5   Title  Pt will ambulate x 230' with RW with supervision, including turning around curve on track, to demonstrate improved endurance and balance with ambulation.    Baseline  115' with RW    Status  Not Met        PT Long Term Goals - 07/03/18 1425      PT LONG TERM GOAL #1   Title  Pt will perform updated HEP for balance and functional strengthening with caregiver's assistance.    Status  Achieved      PT LONG TERM GOAL #2   Title  Pt will improve BERG balance score by 6 points from baseline in order to indicate decreased fall risk.      Time  4    Period  Weeks    Status  On-going    Target Date  08/15/18      PT LONG TERM GOAL #3   Title  Pt will perform TUG with RW in </= 35 secs in order to indicate decreased fall risk.      Baseline  43.84 secs with RW on 04-17-18; 43.16 on 07-03-18    Time  4    Period  Weeks    Status  On-going    Target Date  08/15/18      PT LONG TERM GOAL #4  Title  Pt will perform 5x sit<>stand without UE support in </= 10 secs  in order to indicate improved functional strength.      Baseline  15.38 secs with LUE support on 04-17-18;  12.37 secs on 07-03-18    Time  4    Period  Weeks    Status  On-going    Target Date  08/15/18      PT LONG TERM GOAL #5   Title  Pt will amb. 16' with RW with SBA on even surfaces to demo increased  endurance/activity tolerance.    Baseline  345' with RW with SBA - 07-03-18    Time  4    Period  Weeks    Status  Achieved      PT LONG TERM GOAL #6   Title  Pt will amb. 4 laps (450') with RW without requiring seated rest period to demo increased endurance.      Time  4    Period  Weeks    Status  New    Target Date  08/15/18            Plan - 07/23/18 1941    Clinical Impression Statement  Pt did well with gait training with pt amb. 3 laps around track without seated rest period; increased step length noted on 3rd lap, with pt reporting she was in a hurry to get to sit down;  pt continues to have more difficulty initiating step with LLE than with RLE    Rehab Potential  Fair    Clinical Impairments Affecting Rehab Potential  chronicity of deficits and length of time since onset:  pt has had several OP PT admissions/course of therapy at this facility    PT Frequency  2x / week    PT Duration  8 weeks    PT Treatment/Interventions  ADLs/Self Care Home Management;Functional mobility training;Stair training;Gait training;DME Instruction;Therapeutic activities;Therapeutic exercise;Balance training;Neuromuscular re-education;Patient/family education    PT Next Visit Plan  cont gait training with RW for distance/quality, L lateral weight ; renewal completed 07-04-18    PT Home Exercise Plan  standing balance exercises - pt may benefit from some PWR! exercises    Consulted and Agree with Plan of Care  Patient       Patient will benefit from skilled therapeutic intervention in order to improve the following deficits and impairments:  Abnormal gait, Decreased activity tolerance, Decreased balance, Decreased cognition, Decreased coordination, Decreased mobility, Decreased knowledge of use of DME, Decreased strength, Postural dysfunction, Pain  Visit Diagnosis: Other abnormalities of gait and mobility  Unsteadiness on feet  Muscle weakness (generalized)     Problem List Patient  Active Problem List   Diagnosis Date Noted  . Weakness 10/04/2017  . Acute encephalopathy 10/04/2017  . Acute lower UTI 10/04/2017  . Aortic atherosclerosis ([place]) 10/04/2017  . Grief 09/23/2017  . Osteoporosis 04/16/2017  . Irregular heart beat 11/15/2016  . Hyperglycemia   . Oral thrush 07/18/2016  . Odynophagia 07/18/2016  . Anoxic brain injury ([place]) 12/29/2015  . Gait disorder 12/28/2015  . Well adult exam 12/08/2014  . Fracture of left humerus 06/24/2014  . Humerus fracture 06/24/2014  . Left elbow pain 11/03/2013  . Ankle ulcer ([place]) 04/02/2012  . Decubitus ulcer of coccyx 05/21/2011  . B12 deficiency 04/04/2010  . HYPERLIPIDEMIA 04/04/2010  . Anxiety state 11/14/2009  . Vitamin D deficiency 10/21/2009  . DM2 (diabetes mellitus, type 2) ([place]) 07/07/2009  . OSTEOARTHRITIS 02/25/2009  . LOW BACK  PAIN 02/25/2009  . TOBACCO USER 01/13/2009  . DEPRESSION 02/26/2007  . PAIN, CHRONIC NEC 02/26/2007  . Venous (peripheral) insufficiency 02/26/2007  . GERD 02/26/2007  . HERNIA, INCISIONAL 02/26/2007  . SHOULDER PAIN, LEFT 02/26/2007  . 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 INSOMNIA 02/26/2007  . Abnormality of gait 02/26/2007  . LEG EDEMA, CHRONIC 02/26/2007    Alda Lea, PT 07/23/2018, 7:45 PM  [facility] [address] [place], [place], [address] Phone: [phone]   Fax:  [phone]  Name: [name] MRN: [id number] Date of Birth: May 14, 1951

## 2018-07-24 ENCOUNTER — Ambulatory Visit: Payer: Medicare Other | Admitting: Physical Therapy

## 2018-07-24 DIAGNOSIS — M6281 Muscle weakness (generalized): Secondary | ICD-10-CM | POA: Diagnosis not present

## 2018-07-24 DIAGNOSIS — R2681 Unsteadiness on feet: Secondary | ICD-10-CM | POA: Diagnosis not present

## 2018-07-24 DIAGNOSIS — R278 Other lack of coordination: Secondary | ICD-10-CM | POA: Diagnosis not present

## 2018-07-24 DIAGNOSIS — R2689 Other abnormalities of gait and mobility: Secondary | ICD-10-CM | POA: Diagnosis not present

## 2018-07-25 NOTE — Therapy (Addendum)
[facility] [address] [place], [place], [address] Phone: [phone]   Fax:  [phone]  Physical Therapy Treatment  Patient Details  Name: [name] MRN: [id number] Date of Birth: Apr 20, 1951 Referring Provider (PT): Dr. Lew Dawes   Encounter Date: 07/24/2018     07/25/18 1948  PT Visits / Re-Eval  Visit Number 19  Number of Visits 25  Date for PT Re-Evaluation 08/15/18  Authorization  Authorization Type Medicare and Medicaid  Authorization Time Period 04-17-18 - 07-16-18; 07-03-18 - 09-02-18  PT Time Calculation  PT Start Time 1316  PT Stop Time 1402  PT Time Calculation (min) 46 min    Past Medical History:  Diagnosis Date  . Anoxic brain injury ([place])   . Anxiety   . Aortic atherosclerosis ([place]) 10/04/2017  . Cellulitis and abscess of other specified site 10/11/2010   Qualifier: Diagnosis of  By: Plotnikov MD, Evie Lacks   . Chronic neck pain   . Chronic pain in shoulder   . COPD (chronic obstructive pulmonary disease) ([place])   . Depression   . Fracture of left humerus 06/24/2014  . Gait disorder   . GERD (gastroesophageal reflux disease)   . Hernia of abdominal cavity   . 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 Hyperlipidemia   . LBP (low back pain)   . Neuropathy   . Osteoarthritis   . Seizure disorder ([place])   . Seizures ([place])   . Type II or unspecified type diabetes mellitus without mention of complication, not stated as uncontrolled 2010  . UTI (urinary tract infection) 10/2017  . Vitamin B 12 deficiency 2011    Past Surgical History:  Procedure Laterality Date  . COLONOSCOPY    . DIAGNOSTIC LAPAROSCOPY     PMH: Exploratory lap  . HERNIA REPAIR     incisional  . ORIF HUMERUS FRACTURE Left 06/24/2014   Procedure: LEFT OPEN REDUCTION INTERNAL FIXATION (ORIF) PROXIMAL HUMERUS FRACTURE;  Surgeon: Johnny Bridge, MD;  Location: [facility];  Service:  Orthopedics;  Laterality: Left;  . PANCREATECTOMY    . TONSILLECTOMY    . TUBAL LIGATION      There were no vitals filed for this visit.                    [facility] Adult PT Treatment/Exercise - 07/28/18 0001      Ambulation/Gait   Ambulation/Gait  Yes    Ambulation/Gait Assistance  4: Min guard    Ambulation Distance (Feet)  230 Feet    Assistive device  Rolling walker    Gait Pattern  Decreased step length - right;Decreased step length - left;Ataxic;Trunk rotated posteriorly on right;Decreased trunk rotation    Ambulation Surface  Level;Indoor      Knee/Hip Exercises: Aerobic   Stepper  SciFit level 2.0 x 5" with UE's and LE's           Balance Exercises - 07/28/18 1834      Balance Exercises: Standing   Standing Eyes Opened  Wide (BOA);Solid surface;2 reps;10 secs    Stepping Strategy  Anterior;Lateral;5 reps   with UE support   Other Standing Exercises  obstacle course around 4 cones - down and back x 1 rep          PT Short Term Goals - 06/10/18 2123      PT SHORT TERM GOAL #1   Title  Pt will perform HEP for standing  balance with caregiver's assistance.    Status  Achieved      PT SHORT TERM GOAL #2   Title  Will assess BERG and improve score by 3 points in order to indicate decreased fall risk.      Status  On-going      PT SHORT TERM GOAL #3   Title  Pt will improve 5TSS to <12 seconds with single UE support in order to indicate improved functional strength.      Baseline  15.38 secs with 1 UE support on 04-17-18;  14.57 secs on 05-20-18    Status  Not Met      PT SHORT TERM GOAL #4   Title  Pt will improve TUG to <39 secs in order to indicate decreased fall risk.      Status  Not Met      PT SHORT TERM GOAL #5   Title  Pt will ambulate x 230' with RW with supervision, including turning around curve on track, to demonstrate improved endurance and balance with ambulation.    Baseline  115' with RW    Status  Not Met        PT Long  Term Goals - 07/03/18 1425      PT LONG TERM GOAL #1   Title  Pt will perform updated HEP for balance and functional strengthening with caregiver's assistance.    Status  Achieved      PT LONG TERM GOAL #2   Title  Pt will improve BERG balance score by 6 points from baseline in order to indicate decreased fall risk.      Time  4    Period  Weeks    Status  On-going    Target Date  08/15/18      PT LONG TERM GOAL #3   Title  Pt will perform TUG with RW in </= 35 secs in order to indicate decreased fall risk.      Baseline  43.84 secs with RW on 04-17-18; 43.16 on 07-03-18    Time  4    Period  Weeks    Status  On-going    Target Date  08/15/18      PT LONG TERM GOAL #4   Title  Pt will perform 5x sit<>stand without UE support in </= 10 secs  in order to indicate improved functional strength.      Baseline  15.38 secs with LUE support on 04-17-18;  12.37 secs on 07-03-18    Time  4    Period  Weeks    Status  On-going    Target Date  08/15/18      PT LONG TERM GOAL #5   Title  Pt will amb. 23' with RW with SBA on even surfaces to demo increased endurance/activity tolerance.    Baseline  345' with RW with SBA - 07-03-18    Time  4    Period  Weeks    Status  Achieved      PT LONG TERM GOAL #6   Title  Pt will amb. 4 laps (450') with RW without requiring seated rest period to demo increased endurance.      Time  4    Period  Weeks    Status  New    Target Date  08/15/18          07/24/18 1729  Plan  Clinical Impression Statement Pt continues to have difficulty with step initiation and with turning toward  left side and also with negotiation of transitional surfaces. Needs cues for increased step length and for walker placement during turning.  Pt will benefit from skilled therapeutic intervention in order to improve on the following deficits Abnormal gait;Decreased activity tolerance;Decreased balance;Decreased cognition;Decreased coordination;Decreased mobility;Decreased  knowledge of use of DME;Decreased strength;Postural dysfunction;Pain  Clinical Impairments Affecting Rehab Potential chronicity of deficits and length of time since onset:  pt has had several OP PT admissions/course of therapy at this facility  PT Frequency 2x / week  PT Duration 8 weeks  PT Treatment/Interventions ADLs/Self Care Home Management;Functional mobility training;Stair training;Gait training;DME Instruction;Therapeutic activities;Therapeutic exercise;Balance training;Neuromuscular re-education;Patient/family education  PT Next Visit Plan cont gait training with RW for distance/quality, L lateral weight ; plan D/C next week  PT Home Exercise Plan standing balance exercises - pt may benefit from some PWR! exercises  Consulted and Agree with Plan of Care Patient         Patient will benefit from skilled therapeutic intervention in order to improve the following deficits and impairments:     Visit Diagnosis: Other abnormalities of gait and mobility  Unsteadiness on feet  Muscle weakness (generalized)     Problem List Patient Active Problem List   Diagnosis Date Noted  . Weakness 10/04/2017  . Acute encephalopathy 10/04/2017  . Acute lower UTI 10/04/2017  . Aortic atherosclerosis ([place]) 10/04/2017  . Grief 09/23/2017  . Osteoporosis 04/16/2017  . Irregular heart beat 11/15/2016  . Hyperglycemia   . Oral thrush 07/18/2016  . Odynophagia 07/18/2016  . Anoxic brain injury ([facility] [place]) 12/29/2015  . Gait disorder 12/28/2015  . Well adult exam 12/08/2014  . Fracture of left humerus 06/24/2014  . Humerus fracture 06/24/2014  . Left elbow pain 11/03/2013  . Ankle ulcer ([place]) 04/02/2012  . Decubitus ulcer of coccyx 05/21/2011  . B12 deficiency 04/04/2010  . HYPERLIPIDEMIA 04/04/2010  . Anxiety state 11/14/2009  . Vitamin D deficiency 10/21/2009  . DM2 (diabetes mellitus, type 2) ([place]) 07/07/2009  . OSTEOARTHRITIS 02/25/2009  . LOW BACK PAIN 02/25/2009  . TOBACCO USER  01/13/2009  . DEPRESSION 02/26/2007  . PAIN, CHRONIC NEC 02/26/2007  . Venous (peripheral) insufficiency 02/26/2007  . GERD 02/26/2007  . HERNIA, INCISIONAL 02/26/2007  . SHOULDER PAIN, LEFT 02/26/2007  . 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 INSOMNIA 02/26/2007  . Abnormality of gait 02/26/2007  . LEG EDEMA, CHRONIC 02/26/2007    Alda Lea, PT 07/28/2018, 6:35 PM  [facility] [address] [place], [place], [address] Phone: [phone]   Fax:  [phone]  Name: EMMALISE HUARD MRN: [id number] Date of Birth: 11/05/1950

## 2018-07-29 ENCOUNTER — Ambulatory Visit: Payer: Medicare Other | Admitting: Physical Therapy

## 2018-07-29 DIAGNOSIS — R2681 Unsteadiness on feet: Secondary | ICD-10-CM

## 2018-07-29 DIAGNOSIS — R2689 Other abnormalities of gait and mobility: Secondary | ICD-10-CM

## 2018-07-29 DIAGNOSIS — M6281 Muscle weakness (generalized): Secondary | ICD-10-CM | POA: Diagnosis not present

## 2018-07-29 DIAGNOSIS — R278 Other lack of coordination: Secondary | ICD-10-CM | POA: Diagnosis not present

## 2018-07-30 ENCOUNTER — Other Ambulatory Visit: Payer: Self-pay | Admitting: Internal Medicine

## 2018-07-30 ENCOUNTER — Encounter: Payer: Self-pay | Admitting: Physical Therapy

## 2018-07-30 NOTE — Therapy (Signed)
[facility] [address] [place], [place], [address] Phone: [phone]   Fax:  [phone]  Physical Therapy Treatment  Patient Details  Name: [name] MRN: [id number] Date of Birth: 03/09/1951 Referring Provider (PT): Dr. Lew Dawes   Encounter Date: 07/29/2018  PT End of Session - 07/30/18 2134    Visit Number  20    Number of Visits  25    Date for PT Re-Evaluation  08/15/18    Authorization Type  Medicare and Medicaid    Authorization Time Period  04-17-18 - 07-16-18; 07-03-18 - 09-02-18    PT Start Time  1316    PT Stop Time  1402    PT Time Calculation (min)  46 min       Past Medical History:  Diagnosis Date  . Anoxic brain injury ([place])   . Anxiety   . Aortic atherosclerosis ([place]) 10/04/2017  . Cellulitis and abscess of other specified site 10/11/2010   Qualifier: Diagnosis of  By: Plotnikov MD, Evie Lacks   . Chronic neck pain   . Chronic pain in shoulder   . COPD (chronic obstructive pulmonary disease) ([place])   . Depression   . Fracture of left humerus 06/24/2014  . Gait disorder   . GERD (gastroesophageal reflux disease)   . Hernia of abdominal cavity   . 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 Hyperlipidemia   . LBP (low back pain)   . Neuropathy   . Osteoarthritis   . Seizure disorder ([place])   . Seizures ([place])   . Type II or unspecified type diabetes mellitus without mention of complication, not stated as uncontrolled 2010  . UTI (urinary tract infection) 10/2017  . Vitamin B 12 deficiency 2011    Past Surgical History:  Procedure Laterality Date  . COLONOSCOPY    . DIAGNOSTIC LAPAROSCOPY     PMH: Exploratory lap  . HERNIA REPAIR     incisional  . ORIF HUMERUS FRACTURE Left 06/24/2014   Procedure: LEFT OPEN REDUCTION INTERNAL FIXATION (ORIF) PROXIMAL HUMERUS FRACTURE;  Surgeon: Johnny Bridge, MD;  Location: [facility];  Service: Orthopedics;   Laterality: Left;  . PANCREATECTOMY    . TONSILLECTOMY    . TUBAL LIGATION      There were no vitals filed for this visit.  Subjective Assessment - 07/30/18 2122    Subjective  Pt reports no problems or changes since previous visit    Patient is accompained by:  --   CNA - Terry   Patient Stated Goals  "I need to walk again"    Currently in Pain?  Yes    Pain Score  6     Pain Location  Back    Pain Orientation  Lower    Pain Descriptors / Indicators  Aching;Sore    Pain Type  Chronic pain    Pain Onset  More than a month ago    Pain Frequency  Constant                       [facility] Adult PT Treatment/Exercise - 07/30/18 0001      Transfers   Transfers  Sit to Stand    Sit to Stand  5: Supervision    Number of Reps  Other reps (comment)   5   Comments  without UE support      Ambulation/Gait   Ambulation/Gait  Yes  Ambulation/Gait Assistance  4: Min guard    Ambulation/Gait Assistance Details  cues to stay close to RW    Ambulation Distance (Feet)  115 Feet   60' 2nd rep; 15' 3rd rep   Assistive device  Rolling walker    Gait Pattern  Decreased step length - right;Decreased step length - left;Ataxic;Trunk rotated posteriorly on right;Decreased trunk rotation    Ambulation Surface  Level;Indoor      Knee/Hip Exercises: Aerobic   Stepper  SciFit level 2.0 x 5" with UE's and LE's       NeuroRe-ed: pt performed kicking bean bags approx. 10' with use of RW for UE support - for improved coordination and for Improved SLS on each LE         PT Short Term Goals - 06/10/18 2123      PT SHORT TERM GOAL #1   Title  Pt will perform HEP for standing balance with caregiver's assistance.    Status  Achieved      PT SHORT TERM GOAL #2   Title  Will assess BERG and improve score by 3 points in order to indicate decreased fall risk.      Status  On-going      PT SHORT TERM GOAL #3   Title  Pt will improve 5TSS to <12 seconds with single UE support in  order to indicate improved functional strength.      Baseline  15.38 secs with 1 UE support on 04-17-18;  14.57 secs on 05-20-18    Status  Not Met      PT SHORT TERM GOAL #4   Title  Pt will improve TUG to <39 secs in order to indicate decreased fall risk.      Status  Not Met      PT SHORT TERM GOAL #5   Title  Pt will ambulate x 230' with RW with supervision, including turning around curve on track, to demonstrate improved endurance and balance with ambulation.    Baseline  115' with RW    Status  Not Met        PT Long Term Goals - 07/03/18 1425      PT LONG TERM GOAL #1   Title  Pt will perform updated HEP for balance and functional strengthening with caregiver's assistance.    Status  Achieved      PT LONG TERM GOAL #2   Title  Pt will improve BERG balance score by 6 points from baseline in order to indicate decreased fall risk.      Time  4    Period  Weeks    Status  On-going    Target Date  08/15/18      PT LONG TERM GOAL #3   Title  Pt will perform TUG with RW in </= 35 secs in order to indicate decreased fall risk.      Baseline  43.84 secs with RW on 04-17-18; 43.16 on 07-03-18    Time  4    Period  Weeks    Status  On-going    Target Date  08/15/18      PT LONG TERM GOAL #4   Title  Pt will perform 5x sit<>stand without UE support in </= 10 secs  in order to indicate improved functional strength.      Baseline  15.38 secs with LUE support on 04-17-18;  12.37 secs on 07-03-18    Time  4    Period  Weeks  Status  On-going    Target Date  08/15/18      PT LONG TERM GOAL #5   Title  Pt will amb. 78' with RW with SBA on even surfaces to demo increased endurance/activity tolerance.    Baseline  345' with RW with SBA - 07-03-18    Time  4    Period  Weeks    Status  Achieved      PT LONG TERM GOAL #6   Title  Pt will amb. 4 laps (450') with RW without requiring seated rest period to demo increased endurance.      Time  4    Period  Weeks    Status  New     Target Date  08/15/18            Plan - 07/30/18 2134    Clinical Impression Statement  This 10th visit progress note covers dates 06-12-18 - 07-29-18.  Pt is progressing slowly towards goals as she continues to have difficulty with turns with more difficulty turning left than Rt and also has difficulty with negotiating transitional surfaces.  Pt is able to amb. 230' with RW but distance varies depending on turns required for negotiation of environmental objects/people and turns required.                                                                                                                                            Rehab Potential  Fair    Clinical Impairments Affecting Rehab Potential  chronicity of deficits and length of time since onset:  pt has had several OP PT admissions/course of therapy at this facility    PT Frequency  2x / week    PT Duration  8 weeks    PT Treatment/Interventions  ADLs/Self Care Home Management;Functional mobility training;Stair training;Gait training;DME Instruction;Therapeutic activities;Therapeutic exercise;Balance training;Neuromuscular re-education;Patient/family education    PT Next Visit Plan  cont gait training with RW for distance/quality, L lateral weight ; renewal completed 07-04-18    PT Home Exercise Plan  standing balance exercises - pt may benefit from some PWR! exercises    Consulted and Agree with Plan of Care  Patient       Patient will benefit from skilled therapeutic intervention in order to improve the following deficits and impairments:  Abnormal gait, Decreased activity tolerance, Decreased balance, Decreased cognition, Decreased coordination, Decreased mobility, Decreased knowledge of use of DME, Decreased strength, Postural dysfunction, Pain  Visit Diagnosis: Other abnormalities of gait and mobility  Unsteadiness on feet  Other lack of coordination     Problem List Patient Active Problem List   Diagnosis Date Noted  .  Weakness 10/04/2017  . Acute encephalopathy 10/04/2017  . Acute lower UTI 10/04/2017  . Aortic atherosclerosis ([place]) 10/04/2017  . Grief 09/23/2017  . Osteoporosis 04/16/2017  . Irregular heart beat 11/15/2016  . Hyperglycemia   .  Oral thrush 07/18/2016  . Odynophagia 07/18/2016  . Anoxic brain injury ([place]) 12/29/2015  . Gait disorder 12/28/2015  . Well adult exam 12/08/2014  . Fracture of left humerus 06/24/2014  . Humerus fracture 06/24/2014  . Left elbow pain 11/03/2013  . Ankle ulcer ([place]) 04/02/2012  . Decubitus ulcer of coccyx 05/21/2011  . B12 deficiency 04/04/2010  . HYPERLIPIDEMIA 04/04/2010  . Anxiety state 11/14/2009  . Vitamin D deficiency 10/21/2009  . DM2 (diabetes mellitus, type 2) ([place]) 07/07/2009  . OSTEOARTHRITIS 02/25/2009  . LOW BACK PAIN 02/25/2009  . TOBACCO USER 01/13/2009  . DEPRESSION 02/26/2007  . PAIN, CHRONIC NEC 02/26/2007  . Venous (peripheral) insufficiency 02/26/2007  . GERD 02/26/2007  . HERNIA, INCISIONAL 02/26/2007  . SHOULDER PAIN, LEFT 02/26/2007  . 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 INSOMNIA 02/26/2007  . Abnormality of gait 02/26/2007  . LEG EDEMA, CHRONIC 02/26/2007    Alda Lea, PT 07/30/2018, 9:48 PM  [facility] [address] [place], [place], [address] Phone: [phone]   Fax:  [phone]  Name: DOSHIA DALIA MRN: [id number] Date of Birth: 24-Apr-1951

## 2018-07-31 ENCOUNTER — Ambulatory Visit: Payer: Medicare Other | Admitting: Physical Therapy

## 2018-07-31 ENCOUNTER — Encounter: Payer: Self-pay | Admitting: Physical Therapy

## 2018-07-31 DIAGNOSIS — R2681 Unsteadiness on feet: Secondary | ICD-10-CM | POA: Diagnosis not present

## 2018-07-31 DIAGNOSIS — R2689 Other abnormalities of gait and mobility: Secondary | ICD-10-CM

## 2018-07-31 DIAGNOSIS — R278 Other lack of coordination: Secondary | ICD-10-CM | POA: Diagnosis not present

## 2018-07-31 DIAGNOSIS — M6281 Muscle weakness (generalized): Secondary | ICD-10-CM | POA: Diagnosis not present

## 2018-07-31 NOTE — Therapy (Addendum)
[facility] [address] [place], [place], [address] Phone: [phone]   Fax:  [phone]  Physical Therapy Treatment  Patient Details  Name: [name] MRN: [id number] Date of Birth: 08/17/1951 Referring Provider (PT): Dr. Lew Dawes   Encounter Date: 07/31/2018  PT End of Session - 07/31/18 2129    Visit Number  21    Number of Visits  25    Date for PT Re-Evaluation  08/15/18    Authorization Type  Medicare and Medicaid    Authorization Time Period  04-17-18 - 07-16-18; 07-03-18 - 09-02-18    PT Start Time  1315    PT Stop Time  1400    PT Time Calculation (min)  45 min       Past Medical History:  Diagnosis Date  . Anoxic brain injury ([place])   . Anxiety   . Aortic atherosclerosis ([place]) 10/04/2017  . Cellulitis and abscess of other specified site 10/11/2010   Qualifier: Diagnosis of  By: Plotnikov MD, Evie Lacks   . Chronic neck pain   . Chronic pain in shoulder   . COPD (chronic obstructive pulmonary disease) ([place])   . Depression   . Fracture of left humerus 06/24/2014  . Gait disorder   . GERD (gastroesophageal reflux disease)   . Hernia of abdominal cavity   . 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 Hyperlipidemia   . LBP (low back pain)   . Neuropathy   . Osteoarthritis   . Seizure disorder ([place])   . Seizures ([place])   . Type II or unspecified type diabetes mellitus without mention of complication, not stated as uncontrolled 2010  . UTI (urinary tract infection) 10/2017  . Vitamin B 12 deficiency 2011    Past Surgical History:  Procedure Laterality Date  . COLONOSCOPY    . DIAGNOSTIC LAPAROSCOPY     PMH: Exploratory lap  . HERNIA REPAIR     incisional  . ORIF HUMERUS FRACTURE Left 06/24/2014   Procedure: LEFT OPEN REDUCTION INTERNAL FIXATION (ORIF) PROXIMAL HUMERUS FRACTURE;  Surgeon: Johnny Bridge, MD;  Location: [facility];  Service: Orthopedics;   Laterality: Left;  . PANCREATECTOMY    . TONSILLECTOMY    . TUBAL LIGATION      There were no vitals filed for this visit.  Subjective Assessment - 07/31/18 2128    Subjective  Pt reports no problems or changes since previous visit - "doing Ok today"    Patient is accompained by:  --   CNA - Terry   Patient Stated Goals  "I need to walk again"    Currently in Pain?  Yes    Pain Score  5     Pain Location  Back    Pain Orientation  Lower    Pain Descriptors / Indicators  Aching    Pain Type  Chronic pain    Pain Onset  More than a month ago    Pain Frequency  Constant      Gait training;  Pt gait trained with RW 115' x 2 reps with CGA with cues for increased left shift and increased Rt step  length                         TherEx:  Sit to stand x 5 reps without UE support from mat with SBA for positioning  SciFit level 2.0 x 5 " with UE's and LE's  NeuroRe-ed;  Pt performed standing balance activity inside // bars with UE support - rockerboard anteriorly/posteriorly 10 reps Stepping over and back of balance beam 5 reps with each leg Marching in place 10 reps each leg; marching forward and backward 10' inside // bars           07/31/18 1738  Plan  Clinical Impression Statement Pt demonstrates decreased weight shift onto LLE with decreased step initiation of RLE with decreased step length.  Pt continues to need UE support for safety with standing balance activities and needs cues to stand erect as pt stands with forward flexed posture.  Balance and weight shift improves with practice and repetition.  Pt will benefit from skilled therapeutic intervention in order to improve on the following deficits Abnormal gait;Decreased activity tolerance;Decreased balance;Decreased cognition;Decreased coordination;Decreased mobility;Decreased knowledge of use of DME;Decreased strength;Postural dysfunction;Pain  Rehab Potential Fair  Clinical Impairments Affecting Rehab  Potential chronicity of deficits and length of time since onset:  pt has had several OP PT admissions/course of therapy at this facility  PT Frequency 2x / week  PT Duration 8 weeks  PT Treatment/Interventions ADLs/Self Care Home Management;Functional mobility training;Stair training;Gait training;DME Instruction;Therapeutic activities;Therapeutic exercise;Balance training;Neuromuscular re-education;Patient/family education  PT Next Visit Plan cont gait training with RW for distance/quality, L lateral weight ; plan D/C next week  PT Home Exercise Plan standing balance exercises - pt may benefit from some PWR! exercises  Consulted and Agree with Plan of Care Patient                       PT Short Term Goals - 06/10/18 2123      PT SHORT TERM GOAL #1   Title  Pt will perform HEP for standing balance with caregiver's assistance.    Status  Achieved      PT SHORT TERM GOAL #2   Title  Will assess BERG and improve score by 3 points in order to indicate decreased fall risk.      Status  On-going      PT SHORT TERM GOAL #3   Title  Pt will improve 5TSS to <12 seconds with single UE support in order to indicate improved functional strength.      Baseline  15.38 secs with 1 UE support on 04-17-18;  14.57 secs on 05-20-18    Status  Not Met      PT SHORT TERM GOAL #4   Title  Pt will improve TUG to <39 secs in order to indicate decreased fall risk.      Status  Not Met      PT SHORT TERM GOAL #5   Title  Pt will ambulate x 230' with RW with supervision, including turning around curve on track, to demonstrate improved endurance and balance with ambulation.    Baseline  115' with RW    Status  Not Met        PT Long Term Goals - 07/03/18 1425      PT LONG TERM GOAL #1   Title  Pt will perform updated HEP for balance and functional strengthening with caregiver's assistance.    Status  Achieved      PT LONG TERM GOAL #2   Title  Pt will improve BERG balance score by 6  points from baseline in order to indicate decreased fall risk.      Time  4  Period  Weeks    Status  On-going    Target Date  08/15/18      PT LONG TERM GOAL #3   Title  Pt will perform TUG with RW in </= 35 secs in order to indicate decreased fall risk.      Baseline  43.84 secs with RW on 04-17-18; 43.16 on 07-03-18    Time  4    Period  Weeks    Status  On-going    Target Date  08/15/18      PT LONG TERM GOAL #4   Title  Pt will perform 5x sit<>stand without UE support in </= 10 secs  in order to indicate improved functional strength.      Baseline  15.38 secs with LUE support on 04-17-18;  12.37 secs on 07-03-18    Time  4    Period  Weeks    Status  On-going    Target Date  08/15/18      PT LONG TERM GOAL #5   Title  Pt will amb. 66' with RW with SBA on even surfaces to demo increased endurance/activity tolerance.    Baseline  345' with RW with SBA - 07-03-18    Time  4    Period  Weeks    Status  Achieved      PT LONG TERM GOAL #6   Title  Pt will amb. 4 laps (450') with RW without requiring seated rest period to demo increased endurance.      Time  4    Period  Weeks    Status  New    Target Date  08/15/18              Patient will benefit from skilled therapeutic intervention in order to improve the following deficits and impairments:     Visit Diagnosis: Other abnormalities of gait and mobility  Unsteadiness on feet  Other lack of coordination  Muscle weakness (generalized)     Problem List Patient Active Problem List   Diagnosis Date Noted  . Weakness 10/04/2017  . Acute encephalopathy 10/04/2017  . Acute lower UTI 10/04/2017  . Aortic atherosclerosis ([place]) 10/04/2017  . Grief 09/23/2017  . Osteoporosis 04/16/2017  . Irregular heart beat 11/15/2016  . Hyperglycemia   . Oral thrush 07/18/2016  . Odynophagia 07/18/2016  . Anoxic brain injury ([place]) 12/29/2015  . Gait disorder 12/28/2015  . Well adult exam 12/08/2014  . Fracture of left  humerus 06/24/2014  . Humerus fracture 06/24/2014  . Left elbow pain 11/03/2013  . Ankle ulcer ([place]) 04/02/2012  . Decubitus ulcer of coccyx 05/21/2011  . B12 deficiency 04/04/2010  . HYPERLIPIDEMIA 04/04/2010  . Anxiety state 11/14/2009  . Vitamin D deficiency 10/21/2009  . DM2 (diabetes mellitus, type 2) ([place]) 07/07/2009  . OSTEOARTHRITIS 02/25/2009  . LOW BACK PAIN 02/25/2009  . TOBACCO USER 01/13/2009  . DEPRESSION 02/26/2007  . PAIN, CHRONIC NEC 02/26/2007  . Venous (peripheral) insufficiency 02/26/2007  . GERD 02/26/2007  . HERNIA, INCISIONAL 02/26/2007  . SHOULDER PAIN, LEFT 02/26/2007  . 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 INSOMNIA 02/26/2007  . Abnormality of gait 02/26/2007  . LEG EDEMA, CHRONIC 02/26/2007    Alda Lea, PT 07/31/2018, 9:31 PM  [facility] [address] [place], [place], [address] Phone: [phone]   Fax:  [phone]  Name: [name] MRN: [id number] Date of Birth: 1951/05/27

## 2018-07-31 NOTE — Telephone Encounter (Signed)
Control database checked last refill: 07/06/2018 LOV: 06/23/2018 NOV:09/18/2018

## 2018-08-03 ENCOUNTER — Other Ambulatory Visit: Payer: Self-pay | Admitting: Internal Medicine

## 2018-08-04 ENCOUNTER — Ambulatory Visit: Payer: Medicare Other | Attending: Internal Medicine | Admitting: Physical Therapy

## 2018-08-04 DIAGNOSIS — M6281 Muscle weakness (generalized): Secondary | ICD-10-CM | POA: Diagnosis not present

## 2018-08-04 DIAGNOSIS — R2689 Other abnormalities of gait and mobility: Secondary | ICD-10-CM | POA: Insufficient documentation

## 2018-08-04 DIAGNOSIS — R2681 Unsteadiness on feet: Secondary | ICD-10-CM | POA: Diagnosis not present

## 2018-08-05 ENCOUNTER — Encounter: Payer: Self-pay | Admitting: Physical Therapy

## 2018-08-05 DIAGNOSIS — Z79891 Long term (current) use of opiate analgesic: Secondary | ICD-10-CM | POA: Diagnosis not present

## 2018-08-05 DIAGNOSIS — G894 Chronic pain syndrome: Secondary | ICD-10-CM | POA: Diagnosis not present

## 2018-08-05 DIAGNOSIS — M79672 Pain in left foot: Secondary | ICD-10-CM | POA: Diagnosis not present

## 2018-08-05 DIAGNOSIS — M47816 Spondylosis without myelopathy or radiculopathy, lumbar region: Secondary | ICD-10-CM | POA: Diagnosis not present
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
## 2018-08-07 ENCOUNTER — Ambulatory Visit: Payer: Medicare Other | Admitting: Physical Therapy

## 2018-08-07 DIAGNOSIS — M6281 Muscle weakness (generalized): Secondary | ICD-10-CM

## 2018-08-07 DIAGNOSIS — R2689 Other abnormalities of gait and mobility: Secondary | ICD-10-CM | POA: Diagnosis not present

## 2018-08-07 DIAGNOSIS — R2681 Unsteadiness on feet: Secondary | ICD-10-CM

## 2018-08-08 NOTE — Therapy (Signed)
[facility] [address] [place], [place], [address] Phone: [phone]   Fax:  [phone]  Physical Therapy Treatment  Patient Details  Name: [name] MRN: [id number] Date of Birth: Feb 14, 1951 Referring Provider (PT): Dr. Lew Dawes   Encounter Date: 08/07/2018  PT End of Session - 08/08/18 1819    Visit Number  23    Number of Visits  25    Date for PT Re-Evaluation  08/15/18    Authorization Type  Medicare and Medicaid    Authorization Time Period  04-17-18 - 07-16-18; 07-03-18 - 09-02-18    PT Start Time  1320    PT Stop Time  1405    PT Time Calculation (min)  45 min       Past Medical History:  Diagnosis Date  . Anoxic brain injury ([place])   . Anxiety   . Aortic atherosclerosis ([place]) 10/04/2017  . Cellulitis and abscess of other specified site 10/11/2010   Qualifier: Diagnosis of  By: Plotnikov MD, Evie Lacks   . Chronic neck pain   . Chronic pain in shoulder   . COPD (chronic obstructive pulmonary disease) ([place])   . Depression   . Fracture of left humerus 06/24/2014  . Gait disorder   . GERD (gastroesophageal reflux disease)   . Hernia of abdominal cavity   . 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 Hyperlipidemia   . LBP (low back pain)   . Neuropathy   . Osteoarthritis   . Seizure disorder ([place])   . Seizures ([place])   . Type II or unspecified type diabetes mellitus without mention of complication, not stated as uncontrolled 2010  . UTI (urinary tract infection) 10/2017  . Vitamin B 12 deficiency 2011    Past Surgical History:  Procedure Laterality Date  . COLONOSCOPY    . DIAGNOSTIC LAPAROSCOPY     PMH: Exploratory lap  . HERNIA REPAIR     incisional  . ORIF HUMERUS FRACTURE Left 06/24/2014   Procedure: LEFT OPEN REDUCTION INTERNAL FIXATION (ORIF) PROXIMAL HUMERUS FRACTURE;  Surgeon: Johnny Bridge, MD;  Location: [facility];  Service: Orthopedics;   Laterality: Left;  . PANCREATECTOMY    . TONSILLECTOMY    . TUBAL LIGATION      There were no vitals filed for this visit.  Subjective Assessment - 08/08/18 1810    Subjective  Pt reports no changes or issues since previous PT session    Patient is accompained by:  --   caregiver, Coralyn Mark   Patient Stated Goals  "I need to walk again"    Currently in Pain?  Yes    Pain Score  5     Pain Location  Knee    Pain Orientation  Left    Pain Descriptors / Indicators  Aching;Discomfort;Dull    Pain Type  Chronic pain    Pain Onset  More than a month ago    Pain Frequency  Intermittent                       [facility] Adult PT Treatment/Exercise - 08/08/18 0001      Transfers   Transfers  Sit to Stand    Sit to Stand  5: Supervision    Number of Reps  Other reps (comment)   2   Comments  with UE support from mat       Ambulation/Gait   Ambulation/Gait  Yes  Ambulation/Gait Assistance  4: Min guard    Ambulation/Gait Assistance Details  cues to stay close to RW    Ambulation Distance (Feet)  60 Feet   50',  75'   Assistive device  Rolling walker    Gait Pattern  Decreased step length - right;Decreased step length - left;Ataxic;Trunk rotated posteriorly on right;Decreased trunk rotation    Ambulation Surface  Level;Indoor      Knee/Hip Exercises: Aerobic   Stepper  SciFit level 2.0 x 5" with UE's and LE's        NeuroRe-ed:  Pt performed zoom ball in seated position with cues to sit erect Progressed this activity to standing with min guard assist - pt performed zoom ball approx. 7 times prior to need for seated Rest period due to fatigue   Pt performed alternate tap ups to 4" step with UE support on RW - 10 reps each foot Performed stepping over balance beam - 5 reps each foot with UE support on RW - min guard assist for safety       PT Short Term Goals - 06/10/18 2123      PT SHORT TERM GOAL #1   Title  Pt will perform HEP for standing balance with  caregiver's assistance.    Status  Achieved      PT SHORT TERM GOAL #2   Title  Will assess BERG and improve score by 3 points in order to indicate decreased fall risk.      Status  On-going      PT SHORT TERM GOAL #3   Title  Pt will improve 5TSS to <12 seconds with single UE support in order to indicate improved functional strength.      Baseline  15.38 secs with 1 UE support on 04-17-18;  14.57 secs on 05-20-18    Status  Not Met      PT SHORT TERM GOAL #4   Title  Pt will improve TUG to <39 secs in order to indicate decreased fall risk.      Status  Not Met      PT SHORT TERM GOAL #5   Title  Pt will ambulate x 230' with RW with supervision, including turning around curve on track, to demonstrate improved endurance and balance with ambulation.    Baseline  115' with RW    Status  Not Met        PT Long Term Goals - 07/03/18 1425      PT LONG TERM GOAL #1   Title  Pt will perform updated HEP for balance and functional strengthening with caregiver's assistance.    Status  Achieved      PT LONG TERM GOAL #2   Title  Pt will improve BERG balance score by 6 points from baseline in order to indicate decreased fall risk.      Time  4    Period  Weeks    Status  On-going    Target Date  08/15/18      PT LONG TERM GOAL #3   Title  Pt will perform TUG with RW in </= 35 secs in order to indicate decreased fall risk.      Baseline  43.84 secs with RW on 04-17-18; 43.16 on 07-03-18    Time  4    Period  Weeks    Status  On-going    Target Date  08/15/18      PT LONG TERM GOAL #4   Title  Pt will perform 5x sit<>stand without UE support in </= 10 secs  in order to indicate improved functional strength.      Baseline  15.38 secs with LUE support on 04-17-18;  12.37 secs on 07-03-18    Time  4    Period  Weeks    Status  On-going    Target Date  08/15/18      PT LONG TERM GOAL #5   Title  Pt will amb. 77' with RW with SBA on even surfaces to demo increased endurance/activity  tolerance.    Baseline  345' with RW with SBA - 07-03-18    Time  4    Period  Weeks    Status  Achieved      PT LONG TERM GOAL #6   Title  Pt will amb. 4 laps (450') with RW without requiring seated rest period to demo increased endurance.      Time  4    Period  Weeks    Status  New    Target Date  08/15/18            Plan - 08/08/18 1819    Clinical Impression Statement  Pt had difficulty with step initiation and difficulty with taking long step with RLE due to decreased weight shift onto LLE in stance phase of gait.  Pt fatigued easily with standing balance activity (zoom ball).  Pt continues to have difficulty with turning and with negotiation of transitional surfaces.      Rehab Potential  Fair    Clinical Impairments Affecting Rehab Potential  chronicity of deficits and length of time since onset:  pt has had several OP PT admissions/course of therapy at this facility    PT Frequency  2x / week    PT Duration  8 weeks    PT Treatment/Interventions  ADLs/Self Care Home Management;Functional mobility training;Stair training;Gait training;DME Instruction;Therapeutic activities;Therapeutic exercise;Balance training;Neuromuscular re-education;Patient/family education    PT Next Visit Plan  cont gait training with RW for distance/quality, L lateral weight ; plan D/C next week    PT Home Exercise Plan  standing balance exercises - pt may benefit from some PWR! exercises    Consulted and Agree with Plan of Care  Patient       Patient will benefit from skilled therapeutic intervention in order to improve the following deficits and impairments:  Abnormal gait, Decreased activity tolerance, Decreased balance, Decreased cognition, Decreased coordination, Decreased mobility, Decreased knowledge of use of DME, Decreased strength, Postural dysfunction, Pain  Visit Diagnosis: Other abnormalities of gait and mobility  Unsteadiness on feet  Muscle weakness (generalized)     Problem  List Patient Active Problem List   Diagnosis Date Noted  . Weakness 10/04/2017  . Acute encephalopathy 10/04/2017  . Acute lower UTI 10/04/2017  . Aortic atherosclerosis ([place]) 10/04/2017  . Grief 09/23/2017  . Osteoporosis 04/16/2017  . Irregular heart beat 11/15/2016  . Hyperglycemia   . Oral thrush 07/18/2016  . Odynophagia 07/18/2016  . Anoxic brain injury ([place]) 12/29/2015  . Gait disorder 12/28/2015  . Well adult exam 12/08/2014  . Fracture of left humerus 06/24/2014  . Humerus fracture 06/24/2014  . Left elbow pain 11/03/2013  . Ankle ulcer ([place]) 04/02/2012  . Decubitus ulcer of coccyx 05/21/2011  . B12 deficiency 04/04/2010  . HYPERLIPIDEMIA 04/04/2010  . Anxiety state 11/14/2009  . Vitamin D deficiency 10/21/2009  . DM2 (diabetes mellitus, type 2) ([place]) 07/07/2009  . OSTEOARTHRITIS 02/25/2009  . LOW BACK  PAIN 02/25/2009  . TOBACCO USER 01/13/2009  . DEPRESSION 02/26/2007  . PAIN, CHRONIC NEC 02/26/2007  . Venous (peripheral) insufficiency 02/26/2007  . GERD 02/26/2007  . HERNIA, INCISIONAL 02/26/2007  . SHOULDER PAIN, LEFT 02/26/2007  . 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 INSOMNIA 02/26/2007  . Abnormality of gait 02/26/2007  . LEG EDEMA, CHRONIC 02/26/2007    Alda Lea, PT 08/08/2018, 6:27 PM  [facility] [address] [place], [place], [address] Phone: [phone]   Fax:  [phone]  Name: [name] MRN: [id number] Date of Birth: 28-Nov-1950

## 2018-08-12 ENCOUNTER — Ambulatory Visit: Payer: Medicare Other | Admitting: Physical Therapy

## 2018-08-12 DIAGNOSIS — R2689 Other abnormalities of gait and mobility: Secondary | ICD-10-CM

## 2018-08-12 DIAGNOSIS — M6281 Muscle weakness (generalized): Secondary | ICD-10-CM

## 2018-08-12 DIAGNOSIS — R2681 Unsteadiness on feet: Secondary | ICD-10-CM

## 2018-08-13 NOTE — Therapy (Signed)
[facility] [address] [place], [place], [address] Phone: [phone]   Fax:  [phone]  Physical Therapy Treatment  Patient Details  Name: [name] MRN: [id number] Date of Birth: September 11, 1951 Referring Provider (PT): Dr. Lew Dawes   Encounter Date: 08/12/2018  PT End of Session - 08/13/18 2148    Visit Number  24    Number of Visits  25    Date for PT Re-Evaluation  08/15/18    Authorization Type  Medicare and Medicaid    Authorization Time Period  04-17-18 - 07-16-18; 07-03-18 - 09-02-18    PT Start Time  1103    PT Stop Time  1148    PT Time Calculation (min)  45 min       Past Medical History:  Diagnosis Date  . Anoxic brain injury ([place])   . Anxiety   . Aortic atherosclerosis ([place]) 10/04/2017  . Cellulitis and abscess of other specified site 10/11/2010   Qualifier: Diagnosis of  By: Plotnikov MD, Evie Lacks   . Chronic neck pain   . Chronic pain in shoulder   . COPD (chronic obstructive pulmonary disease) ([place])   . Depression   . Fracture of left humerus 06/24/2014  . Gait disorder   . GERD (gastroesophageal reflux disease)   . Hernia of abdominal cavity   . 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 Hyperlipidemia   . LBP (low back pain)   . Neuropathy   . Osteoarthritis   . Seizure disorder ([place])   . Seizures ([place])   . Type II or unspecified type diabetes mellitus without mention of complication, not stated as uncontrolled 2010  . UTI (urinary tract infection) 10/2017  . Vitamin B 12 deficiency 2011    Past Surgical History:  Procedure Laterality Date  . COLONOSCOPY    . DIAGNOSTIC LAPAROSCOPY     PMH: Exploratory lap  . HERNIA REPAIR     incisional  . ORIF HUMERUS FRACTURE Left 06/24/2014   Procedure: LEFT OPEN REDUCTION INTERNAL FIXATION (ORIF) PROXIMAL HUMERUS FRACTURE;  Surgeon: Johnny Bridge, MD;  Location: [facility];  Service: Orthopedics;   Laterality: Left;  . PANCREATECTOMY    . TONSILLECTOMY    . TUBAL LIGATION      There were no vitals filed for this visit.  Subjective Assessment - 08/13/18 2137    Subjective  Pt reports she is having a hard time walking today - doesn't know why    Patient Stated Goals  "I need to walk again"    Currently in Pain?  Yes    Pain Score  7     Pain Location  Knee    Pain Orientation  Left    Pain Descriptors / Indicators  Aching;Discomfort;Dull    Pain Onset  More than a month ago    Pain Frequency  Intermittent                       [facility] Adult PT Treatment/Exercise - 08/13/18 0001      Transfers   Transfers  Sit to Stand    Sit to Stand  4: Min guard    Number of Reps  Other reps (comment)   5   Comments  with UE support from mat       Ambulation/Gait   Ambulation/Gait  Yes    Ambulation/Gait Assistance  4: Min guard;4: Min assist   min assist  for step initiation   Ambulation/Gait Assistance Details  cues to stay close inside RW     Ambulation Distance (Feet)  65 Feet   50', 12'    Assistive device  Rolling walker    Gait Pattern  Decreased step length - right;Decreased step length - left;Ataxic;Trunk rotated posteriorly on right;Decreased trunk rotation    Ambulation Surface  Level;Indoor      Knee/Hip Exercises: Aerobic   Stepper  SciFit level 2.0 x 5" with UE's and LE's        NeuroRe-ed; tap ups to 4" step - RLE 5 reps; LLE 5 reps; then alternating RLE and LLE 5 reps with UE support with CGA to min assist  Lateral taps - RLE 5 reps; LLE 5 reps ; backward taps RLE 5 reps, then LLE 5 reps with UE support on RW   Rockerboard (inside bars) 10 reps with bil. UE support on // bars       PT Short Term Goals - 06/10/18 2123      PT SHORT TERM GOAL #1   Title  Pt will perform HEP for standing balance with caregiver's assistance.    Status  Achieved      PT SHORT TERM GOAL #2   Title  Will assess BERG and improve score by 3 points in order to  indicate decreased fall risk.      Status  On-going      PT SHORT TERM GOAL #3   Title  Pt will improve 5TSS to <12 seconds with single UE support in order to indicate improved functional strength.      Baseline  15.38 secs with 1 UE support on 04-17-18;  14.57 secs on 05-20-18    Status  Not Met      PT SHORT TERM GOAL #4   Title  Pt will improve TUG to <39 secs in order to indicate decreased fall risk.      Status  Not Met      PT SHORT TERM GOAL #5   Title  Pt will ambulate x 230' with RW with supervision, including turning around curve on track, to demonstrate improved endurance and balance with ambulation.    Baseline  115' with RW    Status  Not Met        PT Long Term Goals - 07/03/18 1425      PT LONG TERM GOAL #1   Title  Pt will perform updated HEP for balance and functional strengthening with caregiver's assistance.    Status  Achieved      PT LONG TERM GOAL #2   Title  Pt will improve BERG balance score by 6 points from baseline in order to indicate decreased fall risk.      Time  4    Period  Weeks    Status  On-going    Target Date  08/15/18      PT LONG TERM GOAL #3   Title  Pt will perform TUG with RW in </= 35 secs in order to indicate decreased fall risk.      Baseline  43.84 secs with RW on 04-17-18; 43.16 on 07-03-18    Time  4    Period  Weeks    Status  On-going    Target Date  08/15/18      PT LONG TERM GOAL #4   Title  Pt will perform 5x sit<>stand without UE support in </= 10 secs  in order to indicate improved  functional strength.      Baseline  15.38 secs with LUE support on 04-17-18;  12.37 secs on 07-03-18    Time  4    Period  Weeks    Status  On-going    Target Date  08/15/18      PT LONG TERM GOAL #5   Title  Pt will amb. 64' with RW with SBA on even surfaces to demo increased endurance/activity tolerance.    Baseline  345' with RW with SBA - 07-03-18    Time  4    Period  Weeks    Status  Achieved      PT LONG TERM GOAL #6   Title   Pt will amb. 4 laps (450') with RW without requiring seated rest period to demo increased endurance.      Time  4    Period  Weeks    Status  New    Target Date  08/15/18            Plan - 08/13/18 2149    Clinical Impression Statement  Pt had difficulty performing lateral taps with LLE and also more difficulty with ability to take longer steps with RLE today; pt continues to have difficulty with turning and with negotiation of transitional surfaces; plan D/C next session due to plateau in functional progress at this time    Rehab Potential  Fair    Clinical Impairments Affecting Rehab Potential  chronicity of deficits and length of time since onset:  pt has had several OP PT admissions/course of therapy at this facility    PT Frequency  2x / week    PT Duration  8 weeks    PT Treatment/Interventions  ADLs/Self Care Home Management;Functional mobility training;Stair training;Gait training;DME Instruction;Therapeutic activities;Therapeutic exercise;Balance training;Neuromuscular re-education;Patient/family education    PT Next Visit Plan  cont gait training with RW for distance/quality, L lateral weight ; plan D/C next week    PT Home Exercise Plan  standing balance exercises - pt may benefit from some PWR! exercises    Consulted and Agree with Plan of Care  Patient       Patient will benefit from skilled therapeutic intervention in order to improve the following deficits and impairments:  Abnormal gait, Decreased activity tolerance, Decreased balance, Decreased cognition, Decreased coordination, Decreased mobility, Decreased knowledge of use of DME, Decreased strength, Postural dysfunction, Pain  Visit Diagnosis: Other abnormalities of gait and mobility  Unsteadiness on feet  Muscle weakness (generalized)     Problem List Patient Active Problem List   Diagnosis Date Noted  . Weakness 10/04/2017  . Acute encephalopathy 10/04/2017  . Acute lower UTI 10/04/2017  . Aortic  atherosclerosis ([place]) 10/04/2017  . Grief 09/23/2017  . Osteoporosis 04/16/2017  . Irregular heart beat 11/15/2016  . Hyperglycemia   . Oral thrush 07/18/2016  . Odynophagia 07/18/2016  . Anoxic brain injury ([place]) 12/29/2015  . Gait disorder 12/28/2015  . Well adult exam 12/08/2014  . Fracture of left humerus 06/24/2014  . Humerus fracture 06/24/2014  . Left elbow pain 11/03/2013  . Ankle ulcer ([place]) 04/02/2012  . Decubitus ulcer of coccyx 05/21/2011  . B12 deficiency 04/04/2010  . HYPERLIPIDEMIA 04/04/2010  . Anxiety state 11/14/2009  . Vitamin D deficiency 10/21/2009  . DM2 (diabetes mellitus, type 2) ([place]) 07/07/2009  . OSTEOARTHRITIS 02/25/2009  . LOW BACK PAIN 02/25/2009  . TOBACCO USER 01/13/2009  . DEPRESSION 02/26/2007  . PAIN, CHRONIC NEC 02/26/2007  . Venous (peripheral) insufficiency 02/26/2007  .  GERD 02/26/2007  . HERNIA, INCISIONAL 02/26/2007  . SHOULDER PAIN, LEFT 02/26/2007  . 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 INSOMNIA 02/26/2007  . Abnormality of gait 02/26/2007  . LEG EDEMA, CHRONIC 02/26/2007    Alda Lea, PT 08/13/2018, 9:53 PM  [facility] [address] [place], [place], [address] Phone: [phone]   Fax:  [phone]  Name: [name] MRN: [id number] Date of Birth: 1950-10-31

## 2018-08-14 ENCOUNTER — Ambulatory Visit: Payer: Medicare Other | Admitting: Physical Therapy

## 2018-08-14 DIAGNOSIS — R2681 Unsteadiness on feet: Secondary | ICD-10-CM | POA: Diagnosis not present

## 2018-08-14 DIAGNOSIS — M6281 Muscle weakness (generalized): Secondary | ICD-10-CM | POA: Diagnosis not present

## 2018-08-14 DIAGNOSIS — R2689 Other abnormalities of gait and mobility: Secondary | ICD-10-CM | POA: Diagnosis not present

## 2018-08-14 NOTE — Patient Instructions (Signed)
Four Square Stepping    Stand in right front square of four square pattern. Move clockwise, stepping from one square to next. Keep facing forward. Repeat _10___ times per session. Do 1__ sessions per day.  STEP FORWARD/BACK;; SIDE/BACK:  BACKWARD/Back with RIGHT FOOT - hold onto walker or counter

## 2018-08-15 ENCOUNTER — Encounter: Payer: Self-pay | Admitting: Physical Therapy

## 2018-08-15 NOTE — Therapy (Signed)
[facility] [address] [place], [place], [address] Phone: [phone]   Fax:  [phone]  Physical Therapy Treatment/Discharge Summary  Patient Details  Name: [name] MRN: [id number] Date of Birth: 11-Jun-1951 Referring Provider (PT): Dr. Lew Dawes   Encounter Date: 08/14/2018  PT End of Session - 08/15/18 1755    Visit Number  26    Number of Visits  26    Date for PT Re-Evaluation  08/15/18    Authorization Type  Medicare and Medicaid    Authorization Time Period  04-17-18 - 07-16-18; 07-03-18 - 09-02-18    PT Start Time  1447    PT Stop Time  1532    PT Time Calculation (min)  45 min       Past Medical History:  Diagnosis Date  . Anoxic brain injury ([place])   . Anxiety   . Aortic atherosclerosis ([place]) 10/04/2017  . Cellulitis and abscess of other specified site 10/11/2010   Qualifier: Diagnosis of  By: Plotnikov MD, Evie Lacks   . Chronic neck pain   . Chronic pain in shoulder   . COPD (chronic obstructive pulmonary disease) ([place])   . Depression   . Fracture of left humerus 06/24/2014  . Gait disorder   . GERD (gastroesophageal reflux disease)   . Hernia of abdominal cavity   . 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 Hyperlipidemia   . LBP (low back pain)   . Neuropathy   . Osteoarthritis   . Seizure disorder ([place])   . Seizures ([place])   . Type II or unspecified type diabetes mellitus without mention of complication, not stated as uncontrolled 2010  . UTI (urinary tract infection) 10/2017  . Vitamin B 12 deficiency 2011    Past Surgical History:  Procedure Laterality Date  . COLONOSCOPY    . DIAGNOSTIC LAPAROSCOPY     PMH: Exploratory lap  . HERNIA REPAIR     incisional  . ORIF HUMERUS FRACTURE Left 06/24/2014   Procedure: LEFT OPEN REDUCTION INTERNAL FIXATION (ORIF) PROXIMAL HUMERUS FRACTURE;  Surgeon: Johnny Bridge, MD;  Location: [facility];  Service:  Orthopedics;  Laterality: Left;  . PANCREATECTOMY    . TONSILLECTOMY    . TUBAL LIGATION      There were no vitals filed for this visit.  Subjective Assessment - 08/14/18 2225    Subjective  Pt reports she is not doing very well today - has had intestinal problems for about a week and also continues to have left knee and low back pain; doesn't feel that her pain medication is helping a whole lot    Patient is accompained by:  --   CNA -Terry   Patient Stated Goals  "I need to walk again"    Currently in Pain?  Yes    Pain Score  9     Pain Location  Knee    Pain Orientation  Left    Pain Descriptors / Indicators  Aching;Constant;Discomfort    Pain Type  Chronic pain    Pain Onset  More than a month ago    Pain Frequency  Intermittent    Pain Relieving Factors  pain med helps some                       [facility] Adult PT Treatment/Exercise - 08/14/18 1504      Transfers   Transfers  Sit to Stand  Sit to Stand  5: Supervision    Number of Reps  Other reps (comment)   5   Comments  with UE support from mat    also without UE support from mat     Ambulation/Gait   Ambulation/Gait  Yes    Ambulation/Gait Assistance  4: Min guard    Ambulation/Gait Assistance Details  cues to increase Rt step length and to stay close inside RW    Ambulation Distance (Feet)  80 Feet   35   Assistive device  Rolling walker    Gait Pattern  Decreased step length - right;Decreased step length - left;Ataxic;Trunk rotated posteriorly on right;Decreased trunk rotation    Ambulation Surface  Level;Indoor      Knee/Hip Exercises: Aerobic   Stepper  SciFit level 2.0 x 5" with UE's and LE's        NeuroRe-ed;  Pt performed tap ups to 4" step - 5 reps each foot- alternating feet with bil. UE support on RW Pt performed stepping forward/back and then out/in with Rt foot for increased LLE weight bearing and weight shifting onto LLE: pt used bil. UE support on RW for this activity; added  this exercise to HEP  TUG score 1"27 secs with RW  5 times sit to stand score 12.68 secs with 1 UE support from mat      PT Education - 08/15/18 1753    Education Details  pt instructed in stepping forward/back and then out/in with Rt foot for increased LLE weight-bearing - added to HEP    Person(s) Educated  Patient    Methods  Explanation;Demonstration;Handout    Comprehension  Verbalized understanding;Returned demonstration       PT Short Term Goals - 06/10/18 2123      PT SHORT TERM GOAL #1   Title  Pt will perform HEP for standing balance with caregiver's assistance.    Status  Achieved      PT SHORT TERM GOAL #2   Title  Will assess BERG and improve score by 3 points in order to indicate decreased fall risk.      Status  On-going      PT SHORT TERM GOAL #3   Title  Pt will improve 5TSS to <12 seconds with single UE support in order to indicate improved functional strength.      Baseline  15.38 secs with 1 UE support on 04-17-18;  14.57 secs on 05-20-18    Status  Not Met      PT SHORT TERM GOAL #4   Title  Pt will improve TUG to <39 secs in order to indicate decreased fall risk.      Status  Not Met      PT SHORT TERM GOAL #5   Title  Pt will ambulate x 230' with RW with supervision, including turning around curve on track, to demonstrate improved endurance and balance with ambulation.    Baseline  115' with RW    Status  Not Met        PT Long Term Goals - 08/14/18 1450      PT LONG TERM GOAL #1   Title  Pt will perform updated HEP for balance and functional strengthening with caregiver's assistance.    Baseline  met 08-14-18    Status  Achieved      PT LONG TERM GOAL #2   Title  Pt will improve BERG balance score by 6 points from baseline in order to indicate decreased fall  risk.      Status  Deferred      PT LONG TERM GOAL #3   Title  Pt will perform TUG with RW in </= 35 secs in order to indicate decreased fall risk.      Baseline  43.84 secs with RW on  04-17-18; 43.16 on 07-03-18;  1" 27.88 secs with RW    Status  Not Met      PT LONG TERM GOAL #4   Title  Pt will perform 5x sit<>stand without UE support in </= 10 secs  in order to indicate improved functional strength.      Baseline  15.38 secs with LUE support on 04-17-18;  12.37 secs on 07-03-18; 25.94 secs without UE support:  12.68 secs with UE support     Status  Not Met      PT LONG TERM GOAL #5   Title  Pt will amb. 53' with RW with SBA on even surfaces to demo increased endurance/activity tolerance.    Baseline  345' with RW with SBA - 07-03-18; 66' prior to seated rest period on 08-14-18    Status  Not Met      PT LONG TERM GOAL #6   Title  Pt will amb. 4 laps (450') with RW without requiring seated rest period to demo increased endurance.      Status  Not Met            Plan - 08/15/18 1801    Clinical Impression Statement  Pt has met LTG #1 with LTG's #2-6 unmet due to decreased standing balance, decreased coordination and step initiation and decreased independence with gait.  Pt has much difficulty with negotiation of transitional surfaces and with turning and negotiation of crowded spaces.  Pt's status varies depending on environmental factors such as crowded space and people, stress, and anxiety.  Pt is discharged due to lack of progress with completion of this certification period.      Rehab Potential  Fair    Clinical Impairments Affecting Rehab Potential  chronicity of deficits and length of time since onset:  pt has had several OP PT admissions/course of therapy at this facility    PT Frequency  2x / week    PT Duration  8 weeks    PT Treatment/Interventions  ADLs/Self Care Home Management;Functional mobility training;Stair training;Gait training;DME Instruction;Therapeutic activities;Therapeutic exercise;Balance training;Neuromuscular re-education;Patient/family education    PT Next Visit Plan  N/A - D/C    PT Home Exercise Plan  standing balance exercises - pt may  benefit from some PWR! exercises; added stepping up/back and out/in with RLE to HEP on 08-14-18    Consulted and Agree with Plan of Care  Patient       Patient will benefit from skilled therapeutic intervention in order to improve the following deficits and impairments:  Abnormal gait, Decreased activity tolerance, Decreased balance, Decreased cognition, Decreased coordination, Decreased mobility, Decreased knowledge of use of DME, Decreased strength, Postural dysfunction, Pain  Visit Diagnosis: Other abnormalities of gait and mobility  Unsteadiness on feet  Muscle weakness (generalized)     Problem List Patient Active Problem List   Diagnosis Date Noted  . Weakness 10/04/2017  . Acute encephalopathy 10/04/2017  . Acute lower UTI 10/04/2017  . Aortic atherosclerosis ([place]) 10/04/2017  . Grief 09/23/2017  . Osteoporosis 04/16/2017  . Irregular heart beat 11/15/2016  . Hyperglycemia   . Oral thrush 07/18/2016  . Odynophagia 07/18/2016  . Anoxic brain injury ([place]) 12/29/2015  .  Gait disorder 12/28/2015  . Well adult exam 12/08/2014  . Fracture of left humerus 06/24/2014  . Humerus fracture 06/24/2014  . Left elbow pain 11/03/2013  . Ankle ulcer ([place]) 04/02/2012  . Decubitus ulcer of coccyx 05/21/2011  . B12 deficiency 04/04/2010  . HYPERLIPIDEMIA 04/04/2010  . Anxiety state 11/14/2009  . Vitamin D deficiency 10/21/2009  . DM2 (diabetes mellitus, type 2) ([place]) 07/07/2009  . OSTEOARTHRITIS 02/25/2009  . LOW BACK PAIN 02/25/2009  . TOBACCO USER 01/13/2009  . DEPRESSION 02/26/2007  . PAIN, CHRONIC NEC 02/26/2007  . Venous (peripheral) insufficiency 02/26/2007  . GERD 02/26/2007  . HERNIA, INCISIONAL 02/26/2007  . SHOULDER PAIN, LEFT 02/26/2007  . 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 INSOMNIA 02/26/2007  . Abnormality of gait 02/26/2007  . LEG EDEMA, CHRONIC 02/26/2007      PHYSICAL THERAPY DISCHARGE SUMMARY  Visits from  Start of Care: 26  Current functional level related to goals / functional outcomes: See above for progress towards goals; functional status fluctuates    Remaining deficits: Continued decreased standing balance; continued decreased independence with gait  Continued decreased coordination, step initiation and difficulty negotiating transitional surfaces    Education / Equipment: Pt has been instructed in HEP for balance exercises and functional strengthening exercises  Plan: Patient agrees to discharge.  Patient goals were not met. Patient is being discharged due to meeting the stated rehab goals.  ?????    Pt has maximized functional progress and has plateaued in functional status; program completed with end of this certication period.         BIPJRP, ZPSUG AYGEFUW, PT 08/15/2018, 6:08 PM  [facility] [address] [place], [place], [address] Phone: [phone]   Fax:  [phone]  Name: ALEJAH ARISTIZABAL MRN: [id number] Date of Birth: 1951-08-29

## 2018-08-19 ENCOUNTER — Ambulatory Visit (INDEPENDENT_AMBULATORY_CARE_PROVIDER_SITE_OTHER)
Admission: RE | Admit: 2018-08-19 | Discharge: 2018-08-19 | Disposition: A | Discharge status: Home / Self Care | Payer: Medicare Other | Source: Ambulatory Visit | Attending: Internal Medicine | Admitting: Internal Medicine

## 2018-08-19 ENCOUNTER — Encounter: Payer: Self-pay | Admitting: Internal Medicine

## 2018-08-19 ENCOUNTER — Ambulatory Visit (INDEPENDENT_AMBULATORY_CARE_PROVIDER_SITE_OTHER): Payer: Medicare Other | Admitting: Internal Medicine

## 2018-08-19 DIAGNOSIS — L97509 Non-pressure chronic ulcer of other part of unspecified foot with unspecified severity: Secondary | ICD-10-CM

## 2018-08-19 DIAGNOSIS — E1100 Type 2 diabetes mellitus with hyperosmolarity without nonketotic hyperglycemic-hyperosmolar coma (NKHHC): Secondary | ICD-10-CM

## 2018-08-19 DIAGNOSIS — E11621 Type 2 diabetes mellitus with foot ulcer: Secondary | ICD-10-CM

## 2018-08-19 DIAGNOSIS — J449 Chronic obstructive pulmonary disease, unspecified: Secondary | ICD-10-CM

## 2018-08-19 DIAGNOSIS — L97421 Non-pressure chronic ulcer of left heel and midfoot limited to breakdown of skin: Secondary | ICD-10-CM | POA: Diagnosis not present

## 2018-08-19 MED ORDER — FLUTICASONE-UMECLIDIN-VILANT 100-62.5-25 MCG/INH IN AEPB: 1.0000 | INHALATION_SPRAY | Freq: Every day | RESPIRATORY_TRACT | 11 refills | Status: DC

## 2018-08-19 MED ORDER — CEFDINIR 300 MG PO CAPS: 300.0000 mg | ORAL_CAPSULE | Freq: Two times a day (BID) | ORAL | 0 refills | Status: DC

## 2018-08-19 NOTE — Assessment & Plan Note (Signed)
L heel - early stages Podiatry ref [facility]

## 2018-08-19 NOTE — Patient Instructions (Signed)
You can use over-the-counter  "cold" medicines  such as "Tylenol cold" , "Advil cold",  "Mucinex" or" Mucinex D"  for cough and congestion.   Avoid decongestants if you have high blood pressure and use "Afrin" nasal spray for nasal congestion as directed. Use " Delsym" or" Robitussin" cough syrup varietis for cough.  You can use plain "Tylenol" or "Advil" for fever, chills and achyness. Use Halls or Ricola cough drops.   Please, make an appointment if you are not better or if you're worse.  

## 2018-08-19 NOTE — Assessment & Plan Note (Signed)
CBGs ok per pt

## 2018-08-19 NOTE — Assessment & Plan Note (Addendum)
Exacerbation CXR Trelegy (D/c Breo) Cefdinir po

## 2018-08-19 NOTE — Progress Notes (Signed)
Subjective:  Patient ID: [name], female    DOB: Mar 25, 1951  Age: 67 y.o. MRN: [id number]  CC: No chief complaint on file.   HPI [name] presents for ongoing bronchitis x months C/o L heel pain x weeks rattling in the chest is audible  Outpatient Medications Prior to Visit  Medication Sig Dispense Refill  . albuterol (PROVENTIL HFA;VENTOLIN HFA) 108 (90 Base) MCG/ACT inhaler Inhale 1-2 puffs into the lungs every 6 (six) hours as needed for wheezing or shortness of breath. 1 Inhaler 0  . aspirin 81 MG EC tablet Take 81 mg by mouth daily.      Marland Kitchen b complex vitamins tablet Take 1 tablet by mouth daily. 100 tablet 3  . benzonatate (TESSALON) 200 MG capsule Take 1 capsule (200 mg total) by mouth 3 (three) times daily as needed for cough. 60 capsule 0  . calcium carbonate (OSCAL) 1500 (600 Ca) MG TABS tablet Take 1 tablet (1,500 mg total) by mouth 2 (two) times daily with a meal. 60 tablet 4  . carbidopa-levodopa (SINEMET IR) 25-250 MG tablet TAKE 1 TABLET BY MOUTH THREE TIMES A DAY 270 tablet 2  . cholecalciferol (VITAMIN D) 1000 UNITS tablet Take 2 tablets (2,000 Units total) by mouth daily. 100 tablet 3  . glucose blood (ONETOUCH VERIO) test strip Use to check blood sugars twice a day. Dx E11.9 100 each 5  . HYDROmorphone (DILAUDID) 2 MG tablet Take 2-3 tablets (4-6 mg total) by mouth every 6 (six) hours as needed for moderate pain or severe pain. 75 tablet 0  . LORazepam (ATIVAN) 2 MG tablet TAKE 1 TABLET BY MOUTH 3 TIMES A DAY 90 tablet 3  . metFORMIN (GLUCOPHAGE XR) 750 MG 24 hr tablet Take 1 tablet (750 mg total) by mouth 2 (two) times daily. 180 tablet 3  . metoprolol tartrate (LOPRESSOR) 25 MG tablet Take 0.5 tablets (12.5 mg total) 2 (two) times daily by mouth. 90 tablet 3  . mirtazapine (REMERON) 15 MG tablet TAKE 1 TABLET BY MOUTH AT BEDTIME AT 6-8 PM 90 tablet 2  . nitrofurantoin (MACRODANTIN) 100 MG capsule Take 1 capsule (100 mg total) by mouth every 12 (twelve) hours.  4 capsule 0  . ONETOUCH DELICA LANCETS 33G MISC Use to help check blood sugars twice a day. Dx E11.9 100 each 3  . promethazine (PHENERGAN) 12.5 MG tablet Take 1 tablet (12.5 mg total) by mouth every 6 (six) hours as needed. for nausea 60 tablet 0  . repaglinide (PRANDIN) 2 MG tablet TAKE 1 TABLET (2 MG TOTAL) BY MOUTH 3 (THREE) TIMES DAILY BEFORE MEALS. 90 tablet 11  . tamsulosin (FLOMAX) 0.4 MG CAPS capsule TAKE 1 CAPSULE (0.4 MG TOTAL) DAILY BY MOUTH. 90 capsule 3  . traMADol-acetaminophen (ULTRACET) 37.5-325 MG tablet TAKE 1 TABLET BY MOUTH EVERY SIX HOURS AS NEEDED FOR PAIN * STOP TAKING PLAIN TRAMADOL*  2  . umeclidinium-vilanterol (ANORO ELLIPTA) 62.5-25 MCG/INH AEPB Inhale 1 puff into the lungs daily. 1 each 3  . Vitamin D, Ergocalciferol, (DRISDOL) 50000 units CAPS capsule TAKE 1 CAPSULE (50,000 UNITS TOTAL) BY MOUTH ONCE A WEEK. 6 capsule 0  . zolpidem (AMBIEN) 10 MG tablet Take 1 tablet (10 mg total) by mouth at bedtime as needed for sleep. 30 tablet 3   No facility-administered medications prior to visit.     ROS: Review of Systems  Constitutional: Negative for activity change, appetite change, chills, fatigue and unexpected weight change.  HENT: Negative for congestion,  mouth sores and sinus pressure.   Eyes: Negative for visual disturbance.  Respiratory: Positive for cough, shortness of breath and wheezing. Negative for chest tightness.   Gastrointestinal: Negative for abdominal pain and nausea.  Genitourinary: Negative for difficulty urinating, frequency and vaginal pain.  Musculoskeletal: Negative for back pain and gait problem.  Skin: Positive for color change. Negative for pallor and rash.  Neurological: Negative for dizziness, tremors, weakness, numbness and headaches.  Psychiatric/Behavioral: Negative for confusion and sleep disturbance.    Objective:  BP 128/74 (BP Location: Left Arm, Patient Position: Sitting, Cuff Size: Normal)   Pulse 85   Temp 98.2 F (36.8 C)  (Oral)   Ht 5\' 5"  (1.651 m)   Wt 171 lb (77.6 kg)   SpO2 90% Comment: cold hands  BMI 28.46 kg/m   BP Readings from Last 3 Encounters:  08/19/18 128/74  06/23/18 136/82  03/20/18 126/78    Wt Readings from Last 3 Encounters:  08/19/18 171 lb (77.6 kg)  06/23/18 165 lb (74.8 kg)  03/20/18 167 lb (75.8 kg)    Physical Exam  Constitutional: She appears well-developed. No distress.  HENT:  Head: Normocephalic.  Right Ear: External ear normal.  Left Ear: External ear normal.  Nose: Nose normal.  Mouth/Throat: Oropharynx is clear and moist.  Eyes: Pupils are equal, round, and reactive to light. Conjunctivae are normal. Right eye exhibits no discharge. Left eye exhibits no discharge.  Neck: Normal range of motion. Neck supple. No JVD present. No tracheal deviation present. No thyromegaly present.  Cardiovascular: Normal rate, regular rhythm and normal heart sounds.  Pulmonary/Chest: No stridor. No respiratory distress. She has no wheezes.  Abdominal: Soft. Bowel sounds are normal. She exhibits no distension and no mass. There is no tenderness. There is no rebound and no guarding.  Musculoskeletal: She exhibits no edema or tenderness.  Lymphadenopathy:    She has no cervical adenopathy.  Neurological: She displays normal reflexes. No cranial nerve deficit. She exhibits normal muscle tone. Coordination normal.  Skin: No rash noted. No erythema.  Psychiatric: She has a normal mood and affect. Her behavior is normal. Judgment and thought content normal.  L heel tender with a blister Trace edema B rattling in the chest  Lab Results  Component Value Date   WBC 5.7 10/06/2017   HGB 10.8 (L) 10/06/2017   HCT 34.8 (L) 10/06/2017   PLT 213 10/06/2017   GLUCOSE 176 (H) 06/23/2018   CHOL 213 (H) 04/16/2017   TRIG 183.0 (H) 04/16/2017   HDL 93.40 04/16/2017   LDLDIRECT 100.8 10/26/2013   LDLCALC 83 04/16/2017   ALT 10 12/16/2017   AST 14 12/16/2017   NA 136 06/23/2018   K 4.5  06/23/2018   CL 96 06/23/2018   CREATININE 0.68 06/23/2018   BUN 14 06/23/2018   CO2 33 (H) 06/23/2018   TSH 1.92 03/20/2018   INR 1.12 11/08/2016   HGBA1C 8.8 (H) 06/23/2018    Dg Chest 2 View  Result Date: 10/03/2017 CLINICAL DATA:  Cough and confusion earlier today. Right upper chest pain. Sudden onset of aphasia. Blurry vision. Left-sided facial droop and weakness is chronic. EXAM: CHEST  2 VIEW COMPARISON:  11/08/2016 FINDINGS: Shallow inspiration. Heart size and pulmonary vascularity are normal. Calcification of the aorta. No airspace disease or consolidation in the lungs. Postoperative changes in the right humerus and left upper quadrant. Degenerative changes in the spine. IMPRESSION: No evidence of active pulmonary disease. Shallow inspiration. Aortic atherosclerosis. Electronically Signed   By:  Burman Nieves M.D.   On: 10/03/2017 19:37   Ct Head Wo Contrast  Result Date: 10/03/2017 CLINICAL DATA:  Episode of aphasia with contraction of arms around 4 p.m. today. History of seizures. EXAM: CT HEAD WITHOUT CONTRAST TECHNIQUE: Contiguous axial images were obtained from the base of the skull through the vertex without intravenous contrast. COMPARISON:  Brain MRI 03/30/2011 FINDINGS: Brain: No evidence of acute infarction, hemorrhage, hydrocephalus, extra-axial collection or mass lesion/mass effect. White matter volume loss with generalized thinning of the corpus callosum that is advanced. There is associated low-density in the bilateral cerebral white matter. Patient has history of anoxic brain injury. Vascular: Atherosclerotic calcification.  No hyperdense vessel. Skull: No acute or aggressive finding. Sinuses/Orbits: Chronic patchy appearing opacification of paranasal sinuses. The hypoplastic right sphenoid sinus is completely opacified. IMPRESSION: 1. No acute finding. 2. 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 Electronically Signed   By: Marnee Spring M.D.   On: 10/03/2017  19:32   Mr Laqueta Jean WJ Contrast  Result Date: 10/04/2017 CLINICAL DATA:  Initial evaluation for acute altered mental status. EXAM: MRI HEAD WITHOUT AND WITH CONTRAST TECHNIQUE: Multiplanar, multiecho pulse sequences of the brain and surrounding structures were obtained without and with intravenous contrast. CONTRAST:  12mL MULTIHANCE GADOBENATE DIMEGLUMINE 529 MG/ML IV SOLN COMPARISON:  Prior CT from 10/03/2017. FINDINGS: Brain: Study mildly degraded by motion artifact. Diffuse prominence of the CSF containing spaces compatible with generalized cerebral atrophy. Patchy and confluent T2/FLAIR hyperintensity within the periventricular and deep white matter both cerebral hemispheres, most consistent with chronic small vessel ischemic disease, mild to moderate nature. Superimposed remote lacunar infarcts present within the bilateral periventricular white matter. No abnormal foci of restricted diffusion to suggest acute or subacute ischemia. Gray-white matter differentiation maintained. No areas of remote cortical infarction. No evidence for acute or chronic intracranial hemorrhage. No mass lesion, midline shift or mass effect. Mild ventricular prominence related global parenchymal volume loss of hydrocephalus. No extra-axial fluid collection. No abnormal enhancement. Major dural sinuses are grossly patent. Pituitary gland suprasellar region normal. Midline structures intact and normal. Vascular: Major intracranial vascular flow voids maintained at the skullbase. Skull and upper cervical spine: Craniocervical junction normal. Upper cervical spine normal. Bone marrow signal intensity within normal limits. No scalp soft tissue abnormality. Sinuses/Orbits: Globes and orbital soft tissues within normal limits. Scattered mucosal thickening within the ethmoidal air cells and maxillary sinuses. Small bilateral mastoid effusions, of doubtful significance. Inner ear structures normal. Other: None. IMPRESSION: 1. No acute  intracranial abnormality. 2. Generalized age-related cerebral atrophy with mild to moderate chronic small vessel ischemic disease. Electronically Signed   By: Rise Mu M.D.   On: 10/04/2017 06:24    Assessment & Plan:   There are no diagnoses linked to this encounter.   No orders of the defined types were placed in this encounter.    Follow-up: No follow-ups on file.  [name], MD

## 2018-08-26 ENCOUNTER — Encounter: Payer: Self-pay | Admitting: Podiatry

## 2018-08-26 ENCOUNTER — Ambulatory Visit: Payer: Medicare Other

## 2018-08-26 ENCOUNTER — Ambulatory Visit (INDEPENDENT_AMBULATORY_CARE_PROVIDER_SITE_OTHER): Payer: Medicare Other | Admitting: Podiatry

## 2018-08-26 VITALS — BP 117/65 | HR 84 | Resp 16

## 2018-08-26 DIAGNOSIS — E11621 Type 2 diabetes mellitus with foot ulcer: Secondary | ICD-10-CM | POA: Diagnosis not present

## 2018-08-26 DIAGNOSIS — B351 Tinea unguium: Secondary | ICD-10-CM

## 2018-08-26 DIAGNOSIS — L97509 Non-pressure chronic ulcer of other part of unspecified foot with unspecified severity: Secondary | ICD-10-CM

## 2018-08-26 DIAGNOSIS — M79675 Pain in left toe(s): Secondary | ICD-10-CM

## 2018-08-26 DIAGNOSIS — E1165 Type 2 diabetes mellitus with hyperglycemia: Secondary | ICD-10-CM

## 2018-08-26 DIAGNOSIS — IMO0002 Reserved for concepts with insufficient information to code with codable children: Secondary | ICD-10-CM

## 2018-08-26 DIAGNOSIS — M79674 Pain in right toe(s): Secondary | ICD-10-CM | POA: Diagnosis not present

## 2018-08-26 DIAGNOSIS — Z872 Personal history of diseases of the skin and subcutaneous tissue: Secondary | ICD-10-CM | POA: Diagnosis not present

## 2018-08-26 DIAGNOSIS — L84 Corns and callosities: Secondary | ICD-10-CM

## 2018-08-26 DIAGNOSIS — M79672 Pain in left foot: Secondary | ICD-10-CM

## 2018-08-26 MED ORDER — MUPIROCIN 2 % EX OINT: 1.0000 "application " | TOPICAL_OINTMENT | Freq: Two times a day (BID) | CUTANEOUS | 2 refills | Status: DC

## 2018-09-01 NOTE — Progress Notes (Signed)
Subjective:   Patient ID: [name], female   DOB: 67 y.o.   MRN: [id number]   Subjective: [name] presents the office today for concerns of a possible wound to the back of her left heel which is been ongoing for about 4 months.  She said the area is tender at nighttime she gets a stinging sensation at times.  She states the skin is peeled off.  She has been soaking Epsom salts which did help.  Denies any drainage or pus and denies any redness.  Is also asked for nails be trimmed today as are thickened elongated causing discomfort.  She denies any redness or drainage and swelling to the toenail sites.  She is diabetic her last A1c was 8.8.  Denies any claudication symptoms.   Review of Systems  All other systems reviewed and are negative.  Past Medical History:  Diagnosis Date  . Anoxic brain injury (HCC)   . Anxiety   . Aortic atherosclerosis (HCC) 10/04/2017  . Cellulitis and abscess of other specified site 10/11/2010   Qualifier: Diagnosis of  By: Plotnikov MD, Georgina QuintAleksei V   . Chronic neck pain   . Chronic pain in shoulder   . COPD (chronic obstructive pulmonary disease) (HCC)   . Depression   . Fracture of left humerus 06/24/2014  . Gait disorder   . GERD (gastroesophageal reflux disease)   . Hernia of abdominal cavity   . 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 Hyperlipidemia   . LBP (low back pain)   . Neuropathy   . Osteoarthritis   . Seizure disorder (HCC)   . Seizures (HCC)   . Type II or unspecified type diabetes mellitus without mention of complication, not stated as uncontrolled 2010  . UTI (urinary tract infection) 10/2017  . Vitamin B 12 deficiency 2011    Past Surgical History:  Procedure Laterality Date  . COLONOSCOPY    . DIAGNOSTIC LAPAROSCOPY     PMH: Exploratory lap  . HERNIA REPAIR     incisional  . ORIF HUMERUS FRACTURE Left 06/24/2014   Procedure: LEFT OPEN REDUCTION INTERNAL FIXATION (ORIF) PROXIMAL HUMERUS FRACTURE;   Surgeon: Eulas PostJoshua P Landau, MD;  Location: [facility] OR;  Service: Orthopedics;  Laterality: Left;  . PANCREATECTOMY    . TONSILLECTOMY    . TUBAL LIGATION       Current Outpatient Medications:  .  albuterol (PROVENTIL HFA;VENTOLIN HFA) 108 (90 Base) MCG/ACT inhaler, Inhale 1-2 puffs into the lungs every 6 (six) hours as needed for wheezing or shortness of breath., Disp: 1 Inhaler, Rfl: 0 .  aspirin 81 MG EC tablet, Take 81 mg by mouth daily.  , Disp: , Rfl:  .  b complex vitamins tablet, Take 1 tablet by mouth daily., Disp: 100 tablet, Rfl: 3 .  benzonatate (TESSALON) 200 MG capsule, Take 1 capsule (200 mg total) by mouth 3 (three) times daily as needed for cough., Disp: 60 capsule, Rfl: 0 .  calcium carbonate (OSCAL) 1500 (600 Ca) MG TABS tablet, Take 1 tablet (1,500 mg total) by mouth 2 (two) times daily with a meal., Disp: 60 tablet, Rfl: 4 .  carbidopa-levodopa (SINEMET IR) 25-250 MG tablet, TAKE 1 TABLET BY MOUTH THREE TIMES A DAY, Disp: 270 tablet, Rfl: 2 .  cefdinir (OMNICEF) 300 MG capsule, Take 1 capsule (300 mg total) by mouth 2 (two) times daily., Disp: 28 capsule, Rfl: 0 .  cholecalciferol (VITAMIN D) 1000 UNITS tablet, Take 2 tablets (  2,000 Units total) by mouth daily., Disp: 100 tablet, Rfl: 3 .  Fluticasone-Umeclidin-Vilant (TRELEGY ELLIPTA) 100-62.5-25 MCG/INH AEPB, Inhale 1 spray into the lungs daily., Disp: 1 each, Rfl: 11 .  glucose blood (ONETOUCH VERIO) test strip, Use to check blood sugars twice a day. Dx E11.9, Disp: 100 each, Rfl: 5 .  HYDROmorphone (DILAUDID) 2 MG tablet, Take 2-3 tablets (4-6 mg total) by mouth every 6 (six) hours as needed for moderate pain or severe pain., Disp: 75 tablet, Rfl: 0 .  LORazepam (ATIVAN) 2 MG tablet, TAKE 1 TABLET BY MOUTH 3 TIMES A DAY, Disp: 90 tablet, Rfl: 3 .  metFORMIN (GLUCOPHAGE XR) 750 MG 24 hr tablet, Take 1 tablet (750 mg total) by mouth 2 (two) times daily., Disp: 180 tablet, Rfl: 3 .  metoprolol tartrate (LOPRESSOR) 25 MG tablet,  Take 0.5 tablets (12.5 mg total) 2 (two) times daily by mouth., Disp: 90 tablet, Rfl: 3 .  mirtazapine (REMERON) 15 MG tablet, TAKE 1 TABLET BY MOUTH AT BEDTIME AT 6-8 PM, Disp: 90 tablet, Rfl: 2 .  nitrofurantoin (MACRODANTIN) 100 MG capsule, Take 1 capsule (100 mg total) by mouth every 12 (twelve) hours., Disp: 4 capsule, Rfl: 0 .  ONETOUCH DELICA LANCETS 33G MISC, Use to help check blood sugars twice a day. Dx E11.9, Disp: 100 each, Rfl: 3 .  promethazine (PHENERGAN) 12.5 MG tablet, Take 1 tablet (12.5 mg total) by mouth every 6 (six) hours as needed. for nausea, Disp: 60 tablet, Rfl: 0 .  repaglinide (PRANDIN) 2 MG tablet, TAKE 1 TABLET (2 MG TOTAL) BY MOUTH 3 (THREE) TIMES DAILY BEFORE MEALS., Disp: 90 tablet, Rfl: 11 .  tamsulosin (FLOMAX) 0.4 MG CAPS capsule, TAKE 1 CAPSULE (0.4 MG TOTAL) DAILY BY MOUTH., Disp: 90 capsule, Rfl: 3 .  traMADol-acetaminophen (ULTRACET) 37.5-325 MG tablet, TAKE 1 TABLET BY MOUTH EVERY SIX HOURS AS NEEDED FOR PAIN * STOP TAKING PLAIN TRAMADOL*, Disp: , Rfl: 2 .  Vitamin D, Ergocalciferol, (DRISDOL) 50000 units CAPS capsule, TAKE 1 CAPSULE (50,000 UNITS TOTAL) BY MOUTH ONCE A WEEK., Disp: 6 capsule, Rfl: 0 .  zolpidem (AMBIEN) 10 MG tablet, Take 1 tablet (10 mg total) by mouth at bedtime as needed for sleep., Disp: 30 tablet, Rfl: 3 .  mupirocin ointment (BACTROBAN) 2 %, Apply 1 application topically 2 (two) times daily., Disp: 30 g, Rfl: 2  Allergies  Allergen Reactions  . Asa [Aspirin] Other (See Comments)    "stomach burning", "I can take baby aspirin"  . Prednisone Other (See Comments)    MD told it gavepancreatitis  and almost died         Objective:  Physical Exam  General: AAO x3, NAD  Dermatological: Nails are hypertrophic, dystrophic, brittle, discolored, elongated 10. No surrounding redness or drainage. Tenderness nails 1-5 bilaterally.  To the posterior aspect the left heel is a hyperkeratotic lesion and there is a central scab present.  This  appears to be healed wound at this time.  There is no edema, erythema, drainage or pus there is no clinical signs of infection present today.  Overall the wound appears to be healed there is no other open lesions identified at this time.  No open lesions or pre-ulcerative lesions are identified today.  Vascular: Dorsalis Pedis artery and Posterior Tibial artery pedal pulses are palpable bilateral with immedate capillary fill time. Pedal hair growth present.There is no pain with calf compression, swelling, warmth, erythema.   Neruologic: Grossly intact via light touch bilateral. Protective threshold with  Semmes Wienstein monofilament intact to all pedal sites bilateral.   Musculoskeletal: No gross boney pedal deformities bilateral. No pain, crepitus, or limitation noted with foot and ankle range of motion bilateral. Muscular strength 5/5 in all groups tested bilateral.     Assessment:   Pre-ulcerative area left heel posterior; symptomatic onychomycosis     Plan:  -Treatment options discussed including all alternatives, risks, and complications -Etiology of symptoms were discussed -I debrided the nails x10 without any complications or bleeding. -Overall the wound to the posterior aspect left heel appears to be healed.  I want her to use a small amount of moisturizer daily.  Monitoring signs or symptoms of recurrence or infection.  Also discussed offloading her heels when she is sleeping and we also discussed offloading boots. -Monitor for any clinical signs or symptoms of infection and directed to call the office immediately should any occur or go to the ER.  Return in about 2 weeks (around 09/09/2018).  Assist to check the posterior heel  Vivi Barrack DPM

## 2018-09-02 DIAGNOSIS — Z79891 Long term (current) use of opiate analgesic: Secondary | ICD-10-CM | POA: Diagnosis not present

## 2018-09-02 DIAGNOSIS — M47816 Spondylosis without myelopathy or radiculopathy, lumbar region: Secondary | ICD-10-CM | POA: Diagnosis not present

## 2018-09-02 DIAGNOSIS — M25562 Pain in left knee: Secondary | ICD-10-CM | POA: Diagnosis not present

## 2018-09-02 DIAGNOSIS — G894 Chronic pain syndrome: Secondary | ICD-10-CM | POA: Diagnosis not present

## 2018-09-09 ENCOUNTER — Ambulatory Visit: Payer: Medicare Other | Admitting: Podiatry

## 2018-09-16 ENCOUNTER — Encounter: Payer: Self-pay | Admitting: Podiatry

## 2018-09-16 ENCOUNTER — Ambulatory Visit (INDEPENDENT_AMBULATORY_CARE_PROVIDER_SITE_OTHER): Payer: Medicare Other | Admitting: Podiatry

## 2018-09-16 ENCOUNTER — Ambulatory Visit (INDEPENDENT_AMBULATORY_CARE_PROVIDER_SITE_OTHER): Payer: Medicare Other

## 2018-09-16 DIAGNOSIS — L84 Corns and callosities: Secondary | ICD-10-CM

## 2018-09-16 DIAGNOSIS — Z872 Personal history of diseases of the skin and subcutaneous tissue: Secondary | ICD-10-CM | POA: Diagnosis not present

## 2018-09-16 MED ORDER — DICLOFENAC SODIUM 1 % TD GEL: 2.0000 g | Freq: Four times a day (QID) | TRANSDERMAL | 2 refills | Status: DC

## 2018-09-18 ENCOUNTER — Ambulatory Visit: Payer: Medicare Other | Admitting: Internal Medicine

## 2018-09-26 NOTE — Progress Notes (Signed)
Subjective: 67 year old female presents the office today for follow-up evaluation pre-ulcerative area to the back of the left heel.  She states that she is not able to wear the offloading boot.  She has been keeping moisturizer on the area daily and she states it is still tender.  Denies any open sores or any drainage. Denies any systemic complaints such as fevers, chills, nausea, vomiting. No acute changes since last appointment, and no other complaints at this time.   Objective: AAO x3, NAD DP/PT pulses palpable bilaterally, CRT less than 3 seconds There is a pre-ulcerative erythematous area to the posterior aspect the left heel which is blanchable.  There is a small hyperkeratotic lesion in the central aspect and upon debridement there is no underlying ulceration, drainage or any signs of infection noted today.  There is tenderness palpation directly to this area as well. No open lesions or pre-ulcerative lesions.  No pain with calf compression, swelling, warmth, erythema  Assessment: Pre-ulcerative area left heel  Plan: -All treatment options discussed with the patient including all alternatives, risks, complications.  -X-rays were obtained and reviewed.  Is no evidence of foreign body there is no cortical destruction suggestive of osteomyelitis and there is no soft tissue emphysema. -I prescribed Voltaren gel to help with pain. -Continue offloading at all times.  Otherwise on her to keep on moisturizing the area to avoid any skin breakdown.  This time there is no open wound that we need to continue to monitor closely.  No signs of infection at this time. -Patient encouraged to call the office with any questions, concerns, change in symptoms.   Vivi BarrackMatthew R Decorian Schuenemann DPM

## 2018-09-29 DIAGNOSIS — G894 Chronic pain syndrome: Secondary | ICD-10-CM | POA: Diagnosis not present

## 2018-09-29 DIAGNOSIS — Z79891 Long term (current) use of opiate analgesic: Secondary | ICD-10-CM | POA: Diagnosis not present

## 2018-09-29 DIAGNOSIS — M25562 Pain in left knee: Secondary | ICD-10-CM | POA: Diagnosis not present

## 2018-09-29 DIAGNOSIS — M47816 Spondylosis without myelopathy or radiculopathy, lumbar region: Secondary | ICD-10-CM | POA: Diagnosis not present

## 2018-10-07 ENCOUNTER — Ambulatory Visit (INDEPENDENT_AMBULATORY_CARE_PROVIDER_SITE_OTHER): Payer: Medicare Other | Admitting: Podiatry

## 2018-10-07 DIAGNOSIS — L84 Corns and callosities: Secondary | ICD-10-CM | POA: Diagnosis not present

## 2018-10-12 NOTE — Progress Notes (Signed)
Subjective: 68 year old female presents the office today for follow-up evaluation pre-ulcerative area to the back of the left heel.  She tells me that she feels great.  Find it feels the same as it was prior to when she had the pain.  Her pain is resolved and the heel her only concern today is that her knee is been hurting.  She denies any recent injury or trauma denies any swelling.  She has had no treatment for the knee.. Denies any systemic complaints such as fevers, chills, nausea, vomiting. No acute changes since last appointment, and no other complaints at this time.   Objective: AAO x3, NAD DP/PT pulses palpable bilaterally, CRT less than 3 seconds The area of the posterior aspect the left heel has resolved.  There is new skin present and there is no erythema to this area there is no tenderness to palpation.  There is no open lesions.  Achilles tendon intact.  No pain with lateral compression of the calcaneus.  No other areas of tenderness. No open lesions or pre-ulcerative lesions.  No pain with calf compression, swelling, warmth, erythema  Assessment: Pre-ulcerative area left heel  Plan: -All treatment options discussed with the patient including all alternatives, risks, complications.  -Her heel pain is resolved.  Discussed continue with moisturizer to the area as well as offloading.  Her only concern today is her knee and discussed that I do not treat the knees.  We will refer her back to her primary care physician for this.  She is in agreement.  She has an upcoming appoint with Dr. Encarnacion Chu DPM

## 2018-10-21 ENCOUNTER — Encounter: Payer: Self-pay | Admitting: Internal Medicine

## 2018-10-21 ENCOUNTER — Ambulatory Visit (INDEPENDENT_AMBULATORY_CARE_PROVIDER_SITE_OTHER)
Admission: RE | Admit: 2018-10-21 | Discharge: 2018-10-21 | Disposition: A | Discharge status: Home / Self Care | Payer: Medicare Other | Source: Ambulatory Visit | Attending: Internal Medicine | Admitting: Internal Medicine

## 2018-10-21 ENCOUNTER — Ambulatory Visit (INDEPENDENT_AMBULATORY_CARE_PROVIDER_SITE_OTHER): Payer: Medicare Other | Admitting: Internal Medicine

## 2018-10-21 DIAGNOSIS — M25562 Pain in left knee: Secondary | ICD-10-CM

## 2018-10-21 DIAGNOSIS — F4321 Adjustment disorder with depressed mood: Secondary | ICD-10-CM | POA: Diagnosis not present

## 2018-10-21 DIAGNOSIS — M1712 Unilateral primary osteoarthritis, left knee: Secondary | ICD-10-CM | POA: Diagnosis not present

## 2018-10-21 DIAGNOSIS — E1100 Type 2 diabetes mellitus with hyperosmolarity without nonketotic hyperglycemic-hyperosmolar coma (NKHHC): Secondary | ICD-10-CM | POA: Diagnosis not present

## 2018-10-21 DIAGNOSIS — E559 Vitamin D deficiency, unspecified: Secondary | ICD-10-CM | POA: Diagnosis not present

## 2018-10-21 MED ORDER — DICLOFENAC SODIUM 1 % TD GEL: 2.0000 g | Freq: Four times a day (QID) | TRANSDERMAL | 2 refills | Status: DC

## 2018-10-21 NOTE — Progress Notes (Signed)
Subjective:  Patient ID: [name], female    DOB: [birth year]  Age: 68 y.o. MRN: [id number]  CC: No chief complaint on file.   HPI [name] presents for L knee pain - worse lately No injury F/u DM, Vit D def  Outpatient Medications Prior to Visit  Medication Sig Dispense Refill  . albuterol (PROVENTIL HFA;VENTOLIN HFA) 108 (90 Base) MCG/ACT inhaler Inhale 1-2 puffs into the lungs every 6 (six) hours as needed for wheezing or shortness of breath. 1 Inhaler 0  . aspirin 81 MG EC tablet Take 81 mg by mouth daily.      Marland Kitchen. b complex vitamins tablet Take 1 tablet by mouth daily. 100 tablet 3  . benzonatate (TESSALON) 200 MG capsule Take 1 capsule (200 mg total) by mouth 3 (three) times daily as needed for cough. 60 capsule 0  . calcium carbonate (OSCAL) 1500 (600 Ca) MG TABS tablet Take 1 tablet (1,500 mg total) by mouth 2 (two) times daily with a meal. 60 tablet 4  . carbidopa-levodopa (SINEMET IR) 25-250 MG tablet TAKE 1 TABLET BY MOUTH THREE TIMES A DAY 270 tablet 2  . cefdinir (OMNICEF) 300 MG capsule Take 1 capsule (300 mg total) by mouth 2 (two) times daily. 28 capsule 0  . cholecalciferol (VITAMIN D) 1000 UNITS tablet Take 2 tablets (2,000 Units total) by mouth daily. 100 tablet 3  . diclofenac sodium (VOLTAREN) 1 % GEL Apply 2 g topically 4 (four) times daily. Rub into affected area of foot 2 to 4 times daily 100 g 2  . Fluticasone-Umeclidin-Vilant (TRELEGY ELLIPTA) 100-62.5-25 MCG/INH AEPB Inhale 1 spray into the lungs daily. 1 each 11  . gabapentin (NEURONTIN) 100 MG capsule PLEASE SEE ATTACHED FOR DETAILED DIRECTIONS  2  . glucose blood (ONETOUCH VERIO) test strip Use to check blood sugars twice a day. Dx E11.9 100 each 5  . HYDROmorphone (DILAUDID) 2 MG tablet Take 2-3 tablets (4-6 mg total) by mouth every 6 (six) hours as needed for moderate pain or severe pain. 75 tablet 0  . LORazepam (ATIVAN) 2 MG tablet TAKE 1 TABLET BY MOUTH 3 TIMES A DAY 90 tablet 3  . metFORMIN  (GLUCOPHAGE XR) 750 MG 24 hr tablet Take 1 tablet (750 mg total) by mouth 2 (two) times daily. 180 tablet 3  . metoprolol tartrate (LOPRESSOR) 25 MG tablet Take 0.5 tablets (12.5 mg total) 2 (two) times daily by mouth. 90 tablet 3  . mirtazapine (REMERON) 15 MG tablet TAKE 1 TABLET BY MOUTH AT BEDTIME AT 6-8 PM 90 tablet 2  . mupirocin ointment (BACTROBAN) 2 % Apply 1 application topically 2 (two) times daily. 30 g 2  . nitrofurantoin (MACRODANTIN) 100 MG capsule Take 1 capsule (100 mg total) by mouth every 12 (twelve) hours. 4 capsule 0  . ONETOUCH DELICA LANCETS 33G MISC Use to help check blood sugars twice a day. Dx E11.9 100 each 3  . promethazine (PHENERGAN) 12.5 MG tablet Take 1 tablet (12.5 mg total) by mouth every 6 (six) hours as needed. for nausea 60 tablet 0  . repaglinide (PRANDIN) 2 MG tablet TAKE 1 TABLET (2 MG TOTAL) BY MOUTH 3 (THREE) TIMES DAILY BEFORE MEALS. 90 tablet 11  . tamsulosin (FLOMAX) 0.4 MG CAPS capsule TAKE 1 CAPSULE (0.4 MG TOTAL) DAILY BY MOUTH. 90 capsule 3  . traMADol-acetaminophen (ULTRACET) 37.5-325 MG tablet TAKE 1 TABLET BY MOUTH EVERY SIX HOURS AS NEEDED FOR PAIN * STOP TAKING PLAIN TRAMADOL*  2  .  Vitamin D, Ergocalciferol, (DRISDOL) 50000 units CAPS capsule TAKE 1 CAPSULE (50,000 UNITS TOTAL) BY MOUTH ONCE A WEEK. 6 capsule 0  . zolpidem (AMBIEN) 10 MG tablet Take 1 tablet (10 mg total) by mouth at bedtime as needed for sleep. 30 tablet 3   No facility-administered medications prior to visit.     ROS: Review of Systems  Constitutional: Positive for fatigue. Negative for activity change, appetite change, chills and unexpected weight change.  HENT: Negative for congestion, mouth sores and sinus pressure.   Eyes: Negative for visual disturbance.  Respiratory: Negative for cough and chest tightness.   Gastrointestinal: Negative for abdominal pain and nausea.  Genitourinary: Negative for difficulty urinating, frequency and vaginal pain.  Musculoskeletal:  Positive for arthralgias, back pain, gait problem, neck pain and neck stiffness.  Skin: Negative for pallor and rash.  Neurological: Negative for dizziness, tremors, weakness, numbness and headaches.  Psychiatric/Behavioral: Negative for confusion and sleep disturbance.    Objective:  BP 122/78 (BP Location: Left Arm, Patient Position: Sitting, Cuff Size: Normal)   Pulse 83   Temp 98 F (36.7 C) (Oral)   Ht 5\' 5"  (1.651 m)   Wt 162 lb (73.5 kg)   SpO2 95%   BMI 26.96 kg/m   BP Readings from Last 3 Encounters:  10/21/18 122/78  08/26/18 117/65  08/19/18 128/74    Wt Readings from Last 3 Encounters:  10/21/18 162 lb (73.5 kg)  08/19/18 171 lb (77.6 kg)  06/23/18 165 lb (74.8 kg)    Physical Exam Constitutional:      General: She is not in acute distress.    Appearance: She is well-developed.  HENT:     Head: Normocephalic.     Right Ear: External ear normal.     Left Ear: External ear normal.     Nose: Nose normal.  Eyes:     General:        Right eye: No discharge.        Left eye: No discharge.     Conjunctiva/sclera: Conjunctivae normal.     Pupils: Pupils are equal, round, and reactive to light.  Neck:     Musculoskeletal: Normal range of motion and neck supple.     Thyroid: No thyromegaly.     Vascular: No JVD.     Trachea: No tracheal deviation.  Cardiovascular:     Rate and Rhythm: Normal rate and regular rhythm.     Heart sounds: Normal heart sounds.  Pulmonary:     Effort: No respiratory distress.     Breath sounds: No stridor. No wheezing.  Abdominal:     General: Bowel sounds are normal. There is no distension.     Palpations: Abdomen is soft. There is no mass.     Tenderness: There is no abdominal tenderness. There is no guarding or rebound.  Musculoskeletal:        General: No tenderness.  Lymphadenopathy:     Cervical: No cervical adenopathy.  Skin:    Findings: No erythema or rash.  Neurological:     Cranial Nerves: No cranial nerve  deficit.     Motor: Weakness present. No abnormal muscle tone.     Coordination: Coordination normal.     Gait: Gait abnormal.     Deep Tendon Reflexes: Reflexes normal.  Psychiatric:        Behavior: Behavior normal.        Thought Content: Thought content normal.        Judgment: Judgment normal.  L knee - pain w/ROM  Lab Results  Component Value Date   WBC 5.7 10/06/2017   HGB 10.8 (L) 10/06/2017   HCT 34.8 (L) 10/06/2017   PLT 213 10/06/2017   GLUCOSE 176 (H) 06/23/2018   CHOL 213 (H) 04/16/2017   TRIG 183.0 (H) 04/16/2017   HDL 93.40 04/16/2017   LDLDIRECT 100.8 10/26/2013   LDLCALC 83 04/16/2017   ALT 10 12/16/2017   AST 14 12/16/2017   NA 136 06/23/2018   K 4.5 06/23/2018   CL 96 06/23/2018   CREATININE 0.68 06/23/2018   BUN 14 06/23/2018   CO2 33 (H) 06/23/2018   TSH 1.92 03/20/2018   INR 1.12 11/08/2016   HGBA1C 8.8 (H) 06/23/2018    Dg Chest 2 View  Result Date: 08/20/2018 CLINICAL DATA:  COPD EXAM: CHEST - 2 VIEW COMPARISON:  10/03/2017 FINDINGS: Heart size and vascularity normal. Lung volume normal. Lungs are clear without infiltrate effusion or mass. Left humeral ORIF for fracture. Surgical clips left upper quadrant. IMPRESSION: No active cardiopulmonary disease. Electronically Signed   By: Marlan Palauharles  Clark M.D.   On: 08/20/2018 08:22    Assessment & Plan:   There are no diagnoses linked to this encounter.   No orders of the defined types were placed in this encounter.    Follow-up: No follow-ups on file.  Sonda PrimesAlex Plotnikov, MD

## 2018-10-21 NOTE — Assessment & Plan Note (Signed)
Metformin, Prandin 

## 2018-10-21 NOTE — Assessment & Plan Note (Signed)
Take Vit D 

## 2018-10-21 NOTE — Assessment & Plan Note (Addendum)
Options discussed  X ray Refused NSAIDS, steroid shots, Medrol dosepac Given Voltaren gel

## 2018-10-21 NOTE — Assessment & Plan Note (Signed)
Coping better 

## 2018-10-27 DIAGNOSIS — M25562 Pain in left knee: Secondary | ICD-10-CM | POA: Diagnosis not present

## 2018-10-27 DIAGNOSIS — M47816 Spondylosis without myelopathy or radiculopathy, lumbar region: Secondary | ICD-10-CM | POA: Diagnosis not present

## 2018-10-27 DIAGNOSIS — Z79891 Long term (current) use of opiate analgesic: Secondary | ICD-10-CM | POA: Diagnosis not present

## 2018-10-27 DIAGNOSIS — G894 Chronic pain syndrome: Secondary | ICD-10-CM | POA: Diagnosis not present

## 2018-11-03 ENCOUNTER — Other Ambulatory Visit: Payer: Self-pay | Admitting: Internal Medicine

## 2018-11-19 ENCOUNTER — Telehealth: Payer: Self-pay | Admitting: Internal Medicine

## 2018-11-19 NOTE — Telephone Encounter (Signed)
Patient received Emmi call to schedule AWV, spoke with patient she stated she will give office a call back when she is ready to schedule her wellness appointment. SF

## 2018-11-22 ENCOUNTER — Other Ambulatory Visit: Payer: Self-pay | Admitting: Internal Medicine

## 2018-11-26 DIAGNOSIS — Z79891 Long term (current) use of opiate analgesic: Secondary | ICD-10-CM | POA: Diagnosis not present

## 2018-11-26 DIAGNOSIS — M47816 Spondylosis without myelopathy or radiculopathy, lumbar region: Secondary | ICD-10-CM | POA: Diagnosis not present

## 2018-11-26 DIAGNOSIS — G894 Chronic pain syndrome: Secondary | ICD-10-CM | POA: Diagnosis not present

## 2018-11-26 DIAGNOSIS — M25562 Pain in left knee: Secondary | ICD-10-CM | POA: Diagnosis not present

## 2018-12-01 ENCOUNTER — Other Ambulatory Visit: Payer: Self-pay | Admitting: Internal Medicine

## 2018-12-19 ENCOUNTER — Other Ambulatory Visit: Payer: Self-pay | Admitting: Internal Medicine

## 2018-12-23 ENCOUNTER — Other Ambulatory Visit: Payer: Self-pay | Admitting: Internal Medicine

## 2018-12-24 DIAGNOSIS — G894 Chronic pain syndrome: Secondary | ICD-10-CM | POA: Diagnosis not present

## 2018-12-24 DIAGNOSIS — Z79891 Long term (current) use of opiate analgesic: Secondary | ICD-10-CM | POA: Diagnosis not present

## 2018-12-24 DIAGNOSIS — M47816 Spondylosis without myelopathy or radiculopathy, lumbar region: Secondary | ICD-10-CM | POA: Diagnosis not present

## 2018-12-24 DIAGNOSIS — M25562 Pain in left knee: Secondary | ICD-10-CM | POA: Diagnosis not present

## 2019-01-20 ENCOUNTER — Encounter (INDEPENDENT_AMBULATORY_CARE_PROVIDER_SITE_OTHER): Payer: Medicare Other | Admitting: Ophthalmology

## 2019-01-21 ENCOUNTER — Telehealth: Payer: Self-pay | Admitting: Internal Medicine

## 2019-01-21 NOTE — Telephone Encounter (Signed)
Insurance has been submitted and verified for Prolia. Prescription will need to be sent to [facility] so that she can pick it up and bring it to the office. Can you send this in please?  Appointment scheduled 01/30/2019

## 2019-01-22 ENCOUNTER — Other Ambulatory Visit: Payer: Self-pay | Admitting: Internal Medicine

## 2019-01-22 DIAGNOSIS — Z79891 Long term (current) use of opiate analgesic: Secondary | ICD-10-CM | POA: Diagnosis not present

## 2019-01-22 DIAGNOSIS — G894 Chronic pain syndrome: Secondary | ICD-10-CM | POA: Diagnosis not present

## 2019-01-22 DIAGNOSIS — M25562 Pain in left knee: Secondary | ICD-10-CM | POA: Diagnosis not present

## 2019-01-22 DIAGNOSIS — M47816 Spondylosis without myelopathy or radiculopathy, lumbar region: Secondary | ICD-10-CM | POA: Diagnosis not present

## 2019-01-26 MED ORDER — DENOSUMAB 60 MG/ML ~~LOC~~ SOSY: 60.0000 mg | PREFILLED_SYRINGE | Freq: Once | SUBCUTANEOUS | 0 refills | Status: AC

## 2019-01-26 NOTE — Telephone Encounter (Signed)
prolia rx sent to pharm---patient to pick up and bring to [facility] office for administration

## 2019-01-30 ENCOUNTER — Ambulatory Visit (INDEPENDENT_AMBULATORY_CARE_PROVIDER_SITE_OTHER): Payer: Medicare Other

## 2019-01-30 DIAGNOSIS — M810 Age-related osteoporosis without current pathological fracture: Secondary | ICD-10-CM

## 2019-01-30 MED ORDER — DENOSUMAB 60 MG/ML ~~LOC~~ SOSY
60.0000 mg | PREFILLED_SYRINGE | Freq: Once | SUBCUTANEOUS | Status: AC
  Administered 2019-01-30: 60 mg via SUBCUTANEOUS

## 2019-02-18 NOTE — Progress Notes (Signed)
Medical screening examination/treatment/procedure(s) were performed by non-physician practitioner and as supervising physician I was immediately available for consultation/collaboration. I agree with above. Aleksei Plotnikov, MD  

## 2019-02-19 DIAGNOSIS — Z79891 Long term (current) use of opiate analgesic: Secondary | ICD-10-CM | POA: Diagnosis not present

## 2019-02-19 DIAGNOSIS — M25562 Pain in left knee: Secondary | ICD-10-CM | POA: Diagnosis not present

## 2019-02-19 DIAGNOSIS — M47816 Spondylosis without myelopathy or radiculopathy, lumbar region: Secondary | ICD-10-CM | POA: Diagnosis not present

## 2019-02-19 DIAGNOSIS — G894 Chronic pain syndrome: Secondary | ICD-10-CM | POA: Diagnosis not present

## 2019-02-21 ENCOUNTER — Other Ambulatory Visit: Payer: Self-pay | Admitting: Internal Medicine

## 2019-03-02 ENCOUNTER — Telehealth: Payer: Self-pay | Admitting: Internal Medicine

## 2019-03-02 NOTE — Telephone Encounter (Signed)
Telephone visit is okay.

## 2019-03-02 NOTE — Telephone Encounter (Signed)
Patient is requesting in office appt for diarrhea.  I have tried to get patient to complete a virtual visit but patient does not have access to a smart phone or computer.   Patient could complete a phone visit.   Please advise.

## 2019-03-02 NOTE — Telephone Encounter (Signed)
I have tried to reach patient to schedule phone visit but I have been unable to reach.   Patient does not have a VM.

## 2019-03-19 DIAGNOSIS — M25562 Pain in left knee: Secondary | ICD-10-CM | POA: Diagnosis not present

## 2019-03-19 DIAGNOSIS — Z79891 Long term (current) use of opiate analgesic: Secondary | ICD-10-CM | POA: Diagnosis not present

## 2019-03-19 DIAGNOSIS — G894 Chronic pain syndrome: Secondary | ICD-10-CM | POA: Diagnosis not present

## 2019-03-19 DIAGNOSIS — M47816 Spondylosis without myelopathy or radiculopathy, lumbar region: Secondary | ICD-10-CM | POA: Diagnosis not present

## 2019-03-21 ENCOUNTER — Telehealth: Payer: Self-pay | Admitting: Internal Medicine

## 2019-03-23 ENCOUNTER — Other Ambulatory Visit: Payer: Self-pay | Admitting: Internal Medicine

## 2019-03-23 NOTE — Telephone Encounter (Signed)
Please advise about metformin alternative

## 2019-03-23 NOTE — Telephone Encounter (Signed)
Pts husband called regarding recall on medication and is requesting to know the options pt has in replacing this medication. Please advise.   [phone]

## 2019-03-24 MED ORDER — METFORMIN HCL 500 MG PO TABS: 500.0000 mg | ORAL_TABLET | Freq: Two times a day (BID) | ORAL | 3 refills | Status: DC

## 2019-03-24 NOTE — Telephone Encounter (Signed)
[place] Controlled Database Checked Last filled: 02/26/19 # 90 LOV w/you: 10/21/18  Next appt w/you: None

## 2019-03-24 NOTE — Telephone Encounter (Signed)
Done. Thank you.

## 2019-03-24 NOTE — Addendum Note (Signed)
Addended by: Cassandria Anger on: 03/24/2019 09:25 PM   Modules accepted: Orders

## 2019-03-25 ENCOUNTER — Other Ambulatory Visit: Payer: Self-pay | Admitting: Internal Medicine

## 2019-03-25 NOTE — Telephone Encounter (Signed)
Husband notified  

## 2019-04-16 DIAGNOSIS — Z79891 Long term (current) use of opiate analgesic: Secondary | ICD-10-CM | POA: Diagnosis not present

## 2019-04-16 DIAGNOSIS — M25562 Pain in left knee: Secondary | ICD-10-CM | POA: Diagnosis not present

## 2019-04-16 DIAGNOSIS — M47816 Spondylosis without myelopathy or radiculopathy, lumbar region: Secondary | ICD-10-CM | POA: Diagnosis not present

## 2019-04-16 DIAGNOSIS — G894 Chronic pain syndrome: Secondary | ICD-10-CM | POA: Diagnosis not present

## 2019-04-30 ENCOUNTER — Other Ambulatory Visit: Payer: Self-pay

## 2019-05-14 DIAGNOSIS — M25562 Pain in left knee: Secondary | ICD-10-CM | POA: Diagnosis not present

## 2019-05-14 DIAGNOSIS — M79672 Pain in left foot: Secondary | ICD-10-CM | POA: Diagnosis not present

## 2019-05-14 DIAGNOSIS — Z79891 Long term (current) use of opiate analgesic: Secondary | ICD-10-CM | POA: Diagnosis not present

## 2019-05-14 DIAGNOSIS — G894 Chronic pain syndrome: Secondary | ICD-10-CM | POA: Diagnosis not present

## 2019-05-15 ENCOUNTER — Other Ambulatory Visit: Payer: Self-pay

## 2019-05-15 ENCOUNTER — Ambulatory Visit (INDEPENDENT_AMBULATORY_CARE_PROVIDER_SITE_OTHER): Payer: Medicare Other | Admitting: Family

## 2019-05-15 ENCOUNTER — Encounter: Payer: Self-pay | Admitting: Family

## 2019-05-15 VITALS — BP 116/80 | HR 63 | Temp 98.2°F | Ht 65.0 in

## 2019-05-15 DIAGNOSIS — L97429 Non-pressure chronic ulcer of left heel and midfoot with unspecified severity: Secondary | ICD-10-CM

## 2019-05-15 MED ORDER — CEPHALEXIN 500 MG PO CAPS: 500.0000 mg | ORAL_CAPSULE | Freq: Three times a day (TID) | ORAL | 0 refills | Status: DC

## 2019-05-15 NOTE — Progress Notes (Signed)
[name] is a 68 y.o. female with the following history as recorded in EpicCare:  Patient Active Problem List   Diagnosis Date Noted  . Knee pain, left 10/21/2018  . Diabetic foot ulcer (HCC) 08/19/2018  . Weakness 10/04/2017  . Acute encephalopathy 10/04/2017  . Acute lower UTI 10/04/2017  . Aortic atherosclerosis (HCC) 10/04/2017  . Grief 09/23/2017  . Osteoporosis 04/16/2017  . Irregular heart beat 11/15/2016  . Hyperglycemia   . Oral thrush 07/18/2016  . Odynophagia 07/18/2016  . Anoxic brain injury (HCC) 12/29/2015  . Gait disorder 12/28/2015  . Well adult exam 12/08/2014  . Fracture of left humerus 06/24/2014  . Humerus fracture 06/24/2014  . Left elbow pain 11/03/2013  . Ankle ulcer (HCC) 04/02/2012  . Decubitus ulcer of coccyx 05/21/2011  . B12 deficiency 04/04/2010  . HYPERLIPIDEMIA 04/04/2010  . Anxiety state 11/14/2009  . Vitamin D deficiency 10/21/2009  . DM2 (diabetes mellitus, type 2) (HCC) 07/07/2009  . COPD (chronic obstructive pulmonary disease) (HCC) 02/25/2009  . OSTEOARTHRITIS 02/25/2009  . LOW BACK PAIN 02/25/2009  . TOBACCO USER 01/13/2009  . DEPRESSION 02/26/2007  . PAIN, CHRONIC NEC 02/26/2007  . Venous (peripheral) insufficiency 02/26/2007  . GERD 02/26/2007  . HERNIA, INCISIONAL 02/26/2007  . SHOULDER PAIN, LEFT 02/26/2007  . 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 INSOMNIA 02/26/2007  . Abnormality of gait 02/26/2007  . LEG EDEMA, CHRONIC 02/26/2007    Current Outpatient Medications  Medication Sig Dispense Refill  . albuterol (PROVENTIL HFA;VENTOLIN HFA) 108 (90 Base) MCG/ACT inhaler Inhale 1-2 puffs into the lungs every 6 (six) hours as needed for wheezing or shortness of breath. 1 Inhaler 0  . aspirin 81 MG EC tablet Take 81 mg by mouth daily.      Marland Kitchen. b complex vitamins tablet Take 1 tablet by mouth daily. 100 tablet 3  . calcium carbonate (OSCAL) 1500 (600 Ca) MG TABS tablet Take 1 tablet (1,500  mg total) by mouth 2 (two) times daily with a meal. 60 tablet 4  . carbidopa-levodopa (SINEMET IR) 25-250 MG tablet Take 1 tablet by mouth 3 (three) times daily. Patient needs office visit before refills will be given 270 tablet 0  . cefdinir (OMNICEF) 300 MG capsule Take 1 capsule (300 mg total) by mouth 2 (two) times daily. 28 capsule 0  . cholecalciferol (VITAMIN D) 1000 UNITS tablet Take 2 tablets (2,000 Units total) by mouth daily. 100 tablet 3  . diclofenac sodium (VOLTAREN) 1 % GEL APPLY 2 G TOPICALLY 4 (FOUR) TIMES DAILY. RUB INTO AFFECTED AREA OF FOOT 2 TO 4 TIMES DAILY 100 g 2  . Fluticasone-Umeclidin-Vilant (TRELEGY ELLIPTA) 100-62.5-25 MCG/INH AEPB Inhale 1 spray into the lungs daily. 1 each 11  . gabapentin (NEURONTIN) 100 MG capsule PLEASE SEE ATTACHED FOR DETAILED DIRECTIONS  2  . glucose blood (ONETOUCH VERIO) test strip Use to check blood sugars twice a day. Dx E11.9 100 each 5  . HYDROmorphone (DILAUDID) 2 MG tablet Take 2-3 tablets (4-6 mg total) by mouth every 6 (six) hours as needed for moderate pain or severe pain. 75 tablet 0  . LORazepam (ATIVAN) 2 MG tablet TAKE 1 TABLET BY MOUTH THREE TIMES A DAY 90 tablet 3  . metFORMIN (GLUCOPHAGE-XR) 750 MG 24 hr tablet TAKE 1 TABLET (750 MG TOTAL) BY MOUTH 2 (TWO) TIMES DAILY.    . metoprolol tartrate (LOPRESSOR) 25 MG tablet TAKE ONE-HALF TABLET (12.5 MG TOTAL) BY MOUTH 2 (TWO) TIMES DAILY  90 tablet 3  . mirtazapine (REMERON) 15 MG tablet TAKE 1 TABLET BY MOUTH AT BEDTIME AT 6-8 PM 90 tablet 3  . mupirocin ointment (BACTROBAN) 2 % Apply 1 application topically 2 (two) times daily. 30 g 2  . nitrofurantoin (MACRODANTIN) 100 MG capsule Take 1 capsule (100 mg total) by mouth every 12 (twelve) hours. 4 capsule 0  . ONETOUCH DELICA LANCETS 63S MISC Use to help check blood sugars twice a day. Dx E11.9 100 each 3  . promethazine (PHENERGAN) 12.5 MG tablet Take 1 tablet (12.5 mg total) by mouth every 6 (six) hours as needed. for nausea 60  tablet 0  . repaglinide (PRANDIN) 2 MG tablet TAKE 1 TABLET (2 MG TOTAL) BY MOUTH 3 (THREE) TIMES DAILY BEFORE MEALS. 90 tablet 11  . tamsulosin (FLOMAX) 0.4 MG CAPS capsule TAKE 1 CAPSULE (0.4 MG TOTAL) DAILY BY MOUTH. 90 capsule 3  . Vitamin D, Ergocalciferol, (DRISDOL) 50000 units CAPS capsule TAKE 1 CAPSULE (50,000 UNITS TOTAL) BY MOUTH ONCE A WEEK. 6 capsule 0  . zolpidem (AMBIEN) 10 MG tablet Take 1 tablet (10 mg total) by mouth at bedtime as needed for sleep. 30 tablet 3  . benzonatate (TESSALON) 200 MG capsule Take 1 capsule (200 mg total) by mouth 3 (three) times daily as needed for cough. (Patient not taking: Reported on 05/15/2019) 60 capsule 0  . cephALEXin (KEFLEX) 500 MG capsule Take 1 capsule (500 mg total) by mouth 3 (three) times daily. 21 capsule 0  . metFORMIN (GLUCOPHAGE) 500 MG tablet Take 1 tablet (500 mg total) by mouth 2 (two) times daily with a meal. (Patient not taking: Reported on 05/15/2019) 180 tablet 3   No current facility-administered medications for this visit.     Allergies: Asa [aspirin] and Prednisone  Past Medical History:  Diagnosis Date  . Anoxic brain injury ([place])   . Anxiety   . Aortic atherosclerosis ([place]) 10/04/2017  . Cellulitis and abscess of other specified site 10/11/2010   Qualifier: Diagnosis of  By: Plotnikov MD, Evie Lacks   . Chronic neck pain   . Chronic pain in shoulder   . COPD (chronic obstructive pulmonary disease) ([place])   . Depression   . Fracture of left humerus 06/24/2014  . Gait disorder   . GERD (gastroesophageal reflux disease)   . Hernia of abdominal cavity   . 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 Hyperlipidemia   . LBP (low back pain)   . Neuropathy   . Osteoarthritis   . Seizure disorder ([place])   . Seizures ([place])   . Type II or unspecified type diabetes mellitus without mention of complication, not stated as uncontrolled 2010  . UTI (urinary tract infection) 10/2017  . Vitamin B 12  deficiency 2011    Past Surgical History:  Procedure Laterality Date  . COLONOSCOPY    . DIAGNOSTIC LAPAROSCOPY     PMH: Exploratory lap  . HERNIA REPAIR     incisional  . ORIF HUMERUS FRACTURE Left 06/24/2014   Procedure: LEFT OPEN REDUCTION INTERNAL FIXATION (ORIF) PROXIMAL HUMERUS FRACTURE;  Surgeon: Johnny Bridge, MD;  Location: [facility];  Service: Orthopedics;  Laterality: Left;  . PANCREATECTOMY    . TONSILLECTOMY    . TUBAL LIGATION      Family History  Problem Relation Age of Onset  . Stroke Mother   . Heart disease Father 63       MI  . Hypertension Other  Social History   Tobacco Use  . Smoking status: Former Smoker    Packs/day: 1.00    Years: 20.00    Pack years: 20.00    Types: Cigarettes    Quit date: 07/18/2016    Years since quitting: 2.8  . Smokeless tobacco: Never Used  Substance Use Topics  . Alcohol use: Yes    Alcohol/week: 10.0 standard drinks    Types: 10 Glasses of wine per week    Comment: 1 glass of wine daily    Subjective:  Patient presents with concerns for recurrent heel pain/ sore on back of heel; last saw podiatrist in January 2021 and was told that no treatment needed; has been using Bactroban with little benefit; notes that pain is "unbearable today" and cannot stand it;       Objective:  Vitals:   05/15/19 1141  BP: 116/80  Pulse: 63  Temp: 98.2 F (36.8 C)  TempSrc: Oral  SpO2: 96%  Height: 5\' 5"  (1.651 m)    General: Well developed, well nourished, in no acute distress  Skin : Warm and dry. Small, localized ulcer noted on back of left heel Head: Normocephalic and atraumatic  Lungs: Respirations unlabored; Neurologic: Alert and oriented; speech intact; face symmetrical;   Assessment:  1. Ulcer of left heel and midfoot, unspecified ulcer stage (HCC)     Plan:  Rx for Keflex 500 mg tid x 7 days; encouraged to continue applying Bactroban to affected area; may need to see wound specialist.   No follow-ups on file.   No orders of the defined types were placed in this encounter.   Requested Prescriptions   Signed Prescriptions Disp Refills  . cephALEXin (KEFLEX) 500 MG capsule 21 capsule 0    Sig: Take 1 capsule (500 mg total) by mouth 3 (three) times daily.

## 2019-05-21 ENCOUNTER — Other Ambulatory Visit: Payer: Self-pay | Admitting: Internal Medicine

## 2019-05-27 ENCOUNTER — Ambulatory Visit: Payer: Medicare Other | Admitting: Family

## 2019-05-27 ENCOUNTER — Other Ambulatory Visit: Payer: Self-pay | Admitting: Family

## 2019-05-27 ENCOUNTER — Ambulatory Visit: Payer: Self-pay | Admitting: *Deleted

## 2019-05-27 DIAGNOSIS — S91302A Unspecified open wound, left foot, initial encounter: Secondary | ICD-10-CM

## 2019-05-27 NOTE — Telephone Encounter (Signed)
Patient has been informed she will need to see podiatrist regarding wound management. Mickel Baas has placed a referral in for her to be seen.

## 2019-05-27 NOTE — Telephone Encounter (Signed)
Message from Oneta Rack sent at 05/27/2019 9:48 AM EDT  Summary: Ulcer of left heel and midfoot - Clinical Advice    Caller name: Ward Chatters  Relation to pt: Home Care  Call back number: [phone]  Pharmacy:  [id number] - [place], [place] [phone] (Phone)  [phone] (Fax)     Reason for call:  Patient was last seen 05/15/2019 by Marrian Salvage, FNP at the [facility] location for Ulcer of left heel and midfoot, unspecified ulcer stage and cephALEXin (KEFLEX) 500 MG capsule was prescribed,symtopms have not improved, patient seeking clinical advice due to her being a diabetic, please advise. Patient return call back # [phone]

## 2019-05-27 NOTE — Telephone Encounter (Signed)
  Reason for Disposition . [1] Ulcer, wound, blister, sore, or red area AND [2] new or increasing  Additional Information . Negative: SEVERE pain    Patient rated pain as severe, but does not feel it is an emergency situation  Answer Assessment - Initial Assessment Questions 1. SYMPTOM: "What's the main symptom you're concerned about?" (e.g., rash, sore, callus, drainage, numbness)     Ulcer not healing  2. LOCATION: "Where is the  ulcer located?" (e.g., foot/toe, top/bottom, left/right)     Bottom of left foot - heel to arch  3. ONSET: "When did the ulcer  start?"    2 months ago 4. PAIN: "Is there any pain?" If so, ask: "How bad is it?" (Scale: 1-10; mild, moderate, severe)     Pain is severe per patient, it is waking her up from sleep  5. CAUSE: "What do you think is causing the symptoms?"    Unsure  6. OTHER SYMPTOMS: "Do you have any other symptoms?" (e.g., fever, weakness)     Denies fever or weakness 7. PREGNANCY: "Is there any chance you are pregnant?" "When was your last menstrual period?"     N/A  Protocols used: DIABETES - [place] with patient's husband who is present with patient.  He is listed as DPR, and patient wanted him to speak with me for her.  Patient has had an ulcer on the bottom of her left foot for about 2 months.  She was seen 05/15/2019 for evaluation of the ulcer and was prescribed Keflex which she completed.  Ulcer is not healing despite completing antibiotic course, and using antibiotic ointment with dressing changes.  Ulcer starts on her left heal and has spread towards the ball of her foot.  He states there is a small amount of yellow drainage on dressing today.  Denies fever, or weakness.  Patient rates the pain as severe, it is causing her not to sleep well.  They do not feel pain is severe enough for an ER visit.  Recommended that patient have an appointment at PCP office today for follow up of wound.  Transferred call to PCP  office, and patient is scheduled for an appointment today at 11:20.

## 2019-06-02 ENCOUNTER — Telehealth: Payer: Self-pay | Admitting: *Deleted

## 2019-06-02 ENCOUNTER — Other Ambulatory Visit: Payer: Self-pay | Admitting: Podiatry

## 2019-06-02 ENCOUNTER — Encounter: Payer: Self-pay | Admitting: Podiatry

## 2019-06-02 ENCOUNTER — Ambulatory Visit (INDEPENDENT_AMBULATORY_CARE_PROVIDER_SITE_OTHER): Payer: Medicare Other | Admitting: Podiatry

## 2019-06-02 ENCOUNTER — Ambulatory Visit (INDEPENDENT_AMBULATORY_CARE_PROVIDER_SITE_OTHER): Payer: Medicare Other

## 2019-06-02 ENCOUNTER — Other Ambulatory Visit: Payer: Self-pay

## 2019-06-02 VITALS — BP 119/66

## 2019-06-02 DIAGNOSIS — E1165 Type 2 diabetes mellitus with hyperglycemia: Secondary | ICD-10-CM | POA: Diagnosis not present

## 2019-06-02 DIAGNOSIS — L97421 Non-pressure chronic ulcer of left heel and midfoot limited to breakdown of skin: Secondary | ICD-10-CM

## 2019-06-02 DIAGNOSIS — IMO0002 Reserved for concepts with insufficient information to code with codable children: Secondary | ICD-10-CM

## 2019-06-02 DIAGNOSIS — E11621 Type 2 diabetes mellitus with foot ulcer: Secondary | ICD-10-CM

## 2019-06-02 DIAGNOSIS — M79672 Pain in left foot: Secondary | ICD-10-CM

## 2019-06-02 DIAGNOSIS — L84 Corns and callosities: Secondary | ICD-10-CM

## 2019-06-02 DIAGNOSIS — L97509 Non-pressure chronic ulcer of other part of unspecified foot with unspecified severity: Secondary | ICD-10-CM | POA: Diagnosis not present

## 2019-06-02 DIAGNOSIS — R0989 Other specified symptoms and signs involving the circulatory and respiratory systems: Secondary | ICD-10-CM

## 2019-06-02 MED ORDER — DOXYCYCLINE HYCLATE 100 MG PO TABS: 100.0000 mg | ORAL_TABLET | Freq: Two times a day (BID) | ORAL | 0 refills | Status: DC

## 2019-06-02 NOTE — Telephone Encounter (Signed)
[facility] - MEDICAID NOTIFICATION STATES, "THIS [place] MEDICAID MEMBER DOES NOT REQUIRE PRIOR AUTHORIZATION FOR OUTPATIENT RADIOLOGY THROUGH [facility] OR  [facility] AT THIS TIME." 

## 2019-06-02 NOTE — Telephone Encounter (Signed)
Faxed orders to [facility]. 

## 2019-06-02 NOTE — Telephone Encounter (Signed)
-----   Message from Trula Slade, DPM sent at 06/02/2019 10:10 AM EDT ----- Can you please order arterial studies given decreased pulse and left heel ulcer? Thanks.

## 2019-06-02 NOTE — Progress Notes (Signed)
Subjective: 68 year old female presents the office today with concerns of pain to the back of her left heel as well as for possible wound.  This is been ongoing now for approximately the last 2 months.  She denies any current drainage or pus coming from the wound but she said that she did have some clear drainage coming from previously.  The back of the heel is very tender along the area of the wound.  She has got diagnosed A1c was 8.8 in the computer June 23, 2018.  She states her blood sugars have been "good".  Denies recent injury.  She only stands to transfer and she does not ambulate. Denies any systemic complaints such as fevers, chills, nausea, vomiting. No acute changes since last appointment, and no other complaints at this time.   Objective: AAO x3, NAD DP/PT pulses palpable however decreased bilaterally, CRT less than 3 seconds To the posterior aspect of the left heel there is a superficial wound measuring 0.7 x 0.4 cm.  There is some fibrotic tissue overlying the wound and upon debridement there was a granular wound base but any probing, undermining or tunneling.  There appears to be Starkes of a pressure sore to the posterior aspect heel.  There is no fluctuation crepitation or any malodor. No open lesions or pre-ulcerative lesions.  No pain with calf compression, swelling, warmth, erythema  Assessment: Left posterior heel wound, pain  Plan: -All treatment options discussed with the patient including all alternatives, risks, complications.  -X-rays were obtained and reviewed.  No obvious signs of acute fracture, osteomyelitis or foreign body. -I lightly debrided the wound today utilizing the 312 with scalpel down to healthy granular tissue.  Recommend swelling to Betadine to the area daily.  Prescribed doxycycline.  I will check arterial studies.  Heel pillow was dispensed. -Patient encouraged to call the office with any questions, concerns, change in symptoms.    Trula Slade DPM

## 2019-06-04 ENCOUNTER — Other Ambulatory Visit: Payer: Self-pay

## 2019-06-04 ENCOUNTER — Ambulatory Visit (HOSPITAL_COMMUNITY)
Admission: RE | Admit: 2019-06-04 | Discharge: 2019-06-04 | Disposition: A | Discharge status: Home / Self Care | Payer: Medicare Other | Source: Ambulatory Visit | Attending: Cardiovascular Disease | Admitting: Cardiovascular Disease

## 2019-06-04 DIAGNOSIS — E11621 Type 2 diabetes mellitus with foot ulcer: Secondary | ICD-10-CM | POA: Diagnosis not present

## 2019-06-04 DIAGNOSIS — R0989 Other specified symptoms and signs involving the circulatory and respiratory systems: Secondary | ICD-10-CM | POA: Diagnosis not present

## 2019-06-04 DIAGNOSIS — E1165 Type 2 diabetes mellitus with hyperglycemia: Secondary | ICD-10-CM | POA: Insufficient documentation

## 2019-06-04 DIAGNOSIS — L84 Corns and callosities: Secondary | ICD-10-CM | POA: Diagnosis not present

## 2019-06-04 DIAGNOSIS — L97509 Non-pressure chronic ulcer of other part of unspecified foot with unspecified severity: Secondary | ICD-10-CM

## 2019-06-04 DIAGNOSIS — M79672 Pain in left foot: Secondary | ICD-10-CM

## 2019-06-04 DIAGNOSIS — L97421 Non-pressure chronic ulcer of left heel and midfoot limited to breakdown of skin: Secondary | ICD-10-CM | POA: Diagnosis not present

## 2019-06-04 DIAGNOSIS — IMO0002 Reserved for concepts with insufficient information to code with codable children: Secondary | ICD-10-CM

## 2019-06-05 ENCOUNTER — Telehealth: Payer: Self-pay | Admitting: *Deleted

## 2019-06-05 NOTE — Telephone Encounter (Signed)
-----   Message from Trula Slade, DPM sent at 06/05/2019 10:21 AM EDT ----- Abnormal arterial study- scheduled to see Dr. Gwenlyn Found- Val please let her know to keep this appointment and I will see her back as scheduled. Monitor for any signs of infection and to let me know if any worsening. She has a small heel wound. Keep offloading at all times as well. Thanks.

## 2019-06-05 NOTE — Telephone Encounter (Signed)
I informed pt of Dr.Wagoner's review of results and orders. Pt states she will keep the appt with Dr. Gwenlyn Found and our office.

## 2019-06-15 DIAGNOSIS — M79672 Pain in left foot: Secondary | ICD-10-CM | POA: Diagnosis not present

## 2019-06-15 DIAGNOSIS — G894 Chronic pain syndrome: Secondary | ICD-10-CM | POA: Diagnosis not present

## 2019-06-15 DIAGNOSIS — Z79891 Long term (current) use of opiate analgesic: Secondary | ICD-10-CM | POA: Diagnosis not present

## 2019-06-15 DIAGNOSIS — M47816 Spondylosis without myelopathy or radiculopathy, lumbar region: Secondary | ICD-10-CM | POA: Diagnosis not present

## 2019-06-18 ENCOUNTER — Ambulatory Visit (INDEPENDENT_AMBULATORY_CARE_PROVIDER_SITE_OTHER): Payer: Medicare Other | Admitting: Podiatry

## 2019-06-18 ENCOUNTER — Other Ambulatory Visit: Payer: Self-pay

## 2019-06-18 DIAGNOSIS — I739 Peripheral vascular disease, unspecified: Secondary | ICD-10-CM

## 2019-06-18 DIAGNOSIS — L97421 Non-pressure chronic ulcer of left heel and midfoot limited to breakdown of skin: Secondary | ICD-10-CM

## 2019-06-18 NOTE — Progress Notes (Signed)
Subjective: 68 year old female presents the office today for follow-up evaluation of a wound on the left heel.  She still gets some pain to the wound she is describing burning to her foot as well as cramps her left leg.  She did recently have vascular studies performed she has a follow-up with Dr. Gwenlyn Found next week for further evaluation.  They have been keeping mupirocin on the wound daily as well as offloading.  Denies any drainage or pus.  No swelling. Denies any systemic complaints such as fevers, chills, nausea, vomiting. No acute changes since last appointment, and no other complaints at this time.   Objective: AAO x3, NAD DP, PT pulses decreased.  Ulceration of the posterior lateral aspect left heel measuring 0.3 x 0.3 cm.  Superficial without any probing, undermining or tunneling.  No surrounding erythema, ascending cellulitis.  No fluctuation crepitation or malodor.  No clinical signs of infection are noted. No open lesions or pre-ulcerative lesions.  No pain with calf compression, swelling, warmth, erythema  Assessment: Left heel ulceration, PAD  Plan: -All treatment options discussed with the patient including all alternatives, risks, complications.  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  Due to this as well as slowly healing ulceration of left foot pain will refer to Dr. Gwenlyn Found.  She has an appointment scheduled for next week. -Continue mupirocin ointment dressing changes daily.  Continue offloading at all times. Will hold any further oral antibiotics at this time as there is no clinical signs of infection but monitor closely. -Patient encouraged to call the office with any questions, concerns, change in symptoms.   Trula Slade DPM

## 2019-06-24 ENCOUNTER — Encounter: Payer: Self-pay | Admitting: Cardiovascular Disease

## 2019-06-24 ENCOUNTER — Other Ambulatory Visit: Payer: Self-pay

## 2019-06-24 ENCOUNTER — Ambulatory Visit (INDEPENDENT_AMBULATORY_CARE_PROVIDER_SITE_OTHER): Payer: Medicare Other | Admitting: Cardiovascular Disease

## 2019-06-24 VITALS — BP 114/63 | HR 84 | Ht 65.0 in | Wt 161.0 lb

## 2019-06-24 DIAGNOSIS — Z008 Encounter for other general examination: Secondary | ICD-10-CM

## 2019-06-24 DIAGNOSIS — E11621 Type 2 diabetes mellitus with foot ulcer: Secondary | ICD-10-CM | POA: Diagnosis not present

## 2019-06-24 DIAGNOSIS — L97421 Non-pressure chronic ulcer of left heel and midfoot limited to breakdown of skin: Secondary | ICD-10-CM | POA: Diagnosis not present

## 2019-06-24 NOTE — Assessment & Plan Note (Signed)
[name] was referred to me by Dr. Jacqualyn Posey, podiatry, for a slowly healing ulcer on her left heel.  She has been nonambulatory for last 20 years after stroke.  She is for the most part wheelchair-bound.  She is had a small left heel ulcer for last 2 to 3 months and has been evaluated by Dr. Jacqualyn Posey over the last several weeks with noticeable improvement.  We did have lower extremity Dopplers performed in our office 06/04/2019 revealing a right ABI of 1.1 and a left of 0.86 with a high-frequency signal in her mid left SFA.  At this point, given the fact that it slowly improving I am going to defer performing intervention.  I discussed this with Dr. Carman Ching.  Should the wound get worse she would be a good candidate for percutaneous intervention.  I will see her back in 4 to 6 weeks.

## 2019-06-24 NOTE — Progress Notes (Signed)
06/24/2019 [name]   June 18, 1951  [id number]  Primary Physician Plotnikov, Georgina Quint, MD Primary Cardiologist: Runell Gess MD Nicholes Calam[place], [place]  HPI:  [name] is a 68 y.o. mildly overweight married Caucasian female with 1 son, grandmother 2 grandchildren referred by Dr. Ardelle Anton, her podiatrist, for peripheral vascular evaluation.  She is accompanied by her husband grabbed.  She has a history of 50 to 70 pack years of tobacco abuse having quit several years ago.  She drinks alcohol daily.  She has diabetes but is not hypertensive or hyperlipidemic.  Her father did die of a myocardial infarction at age 9.  She is never had a heart attack but did have a stroke during an episode of sepsis in 1999.  She has had a small left heel ulcer for last 2 to 3 months which appears to be pressure related and is seen her podiatrist recently with notable improvement in its appearance.   Current Meds  Medication Sig  . albuterol (PROVENTIL HFA;VENTOLIN HFA) 108 (90 Base) MCG/ACT inhaler Inhale 1-2 puffs into the lungs every 6 (six) hours as needed for wheezing or shortness of breath.  Marland Kitchen aspirin 81 MG EC tablet Take 81 mg by mouth daily.    Marland Kitchen b complex vitamins tablet Take 1 tablet by mouth daily.  . benzonatate (TESSALON) 200 MG capsule Take 1 capsule (200 mg total) by mouth 3 (three) times daily as needed for cough.  . calcium carbonate (OSCAL) 1500 (600 Ca) MG TABS tablet Take 1 tablet (1,500 mg total) by mouth 2 (two) times daily with a meal.  . carbidopa-levodopa (SINEMET IR) 25-250 MG tablet TAKE 1 TABLET BY MOUTH 3 (THREE) TIMES DAILY. PATIENT NEEDS OFFICE VISIT BEFORE REFILLS WILL BE GIVEN  . cefdinir (OMNICEF) 300 MG capsule Take 1 capsule (300 mg total) by mouth 2 (two) times daily.  . cholecalciferol (VITAMIN D) 1000 UNITS tablet Take 2 tablets (2,000 Units total) by mouth daily.  . diclofenac sodium (VOLTAREN) 1 % GEL APPLY 2 G TOPICALLY 4 (FOUR) TIMES DAILY. RUB INTO AFFECTED  AREA OF FOOT 2 TO 4 TIMES DAILY  . doxycycline (VIBRA-TABS) 100 MG tablet Take 1 tablet (100 mg total) by mouth 2 (two) times daily.  . Fluticasone-Umeclidin-Vilant (TRELEGY ELLIPTA) 100-62.5-25 MCG/INH AEPB Inhale 1 spray into the lungs daily.  Marland Kitchen gabapentin (NEURONTIN) 100 MG capsule PLEASE SEE ATTACHED FOR DETAILED DIRECTIONS  . glucose blood (ONETOUCH VERIO) test strip Use to check blood sugars twice a day. Dx E11.9  . HYDROmorphone (DILAUDID) 2 MG tablet Take 2-3 tablets (4-6 mg total) by mouth every 6 (six) hours as needed for moderate pain or severe pain.  Marland Kitchen LORazepam (ATIVAN) 2 MG tablet TAKE 1 TABLET BY MOUTH THREE TIMES A DAY  . metFORMIN (GLUCOPHAGE) 500 MG tablet Take 1 tablet (500 mg total) by mouth 2 (two) times daily with a meal.  . metFORMIN (GLUCOPHAGE-XR) 750 MG 24 hr tablet TAKE 1 TABLET (750 MG TOTAL) BY MOUTH 2 (TWO) TIMES DAILY.  . metoprolol tartrate (LOPRESSOR) 25 MG tablet TAKE ONE-HALF TABLET (12.5 MG TOTAL) BY MOUTH 2 (TWO) TIMES DAILY  . mirtazapine (REMERON) 15 MG tablet TAKE 1 TABLET BY MOUTH AT BEDTIME AT 6-8 PM  . mupirocin ointment (BACTROBAN) 2 % Apply 1 application topically 2 (two) times daily.  . nitrofurantoin (MACRODANTIN) 100 MG capsule Take 1 capsule (100 mg total) by mouth every 12 (twelve) hours.  Letta Pate DELICA LANCETS 33G MISC Use to  help check blood sugars twice a day. Dx E11.9  . PROLIA 60 MG/ML SOSY injection   . promethazine (PHENERGAN) 12.5 MG tablet Take 1 tablet (12.5 mg total) by mouth every 6 (six) hours as needed. for nausea  . repaglinide (PRANDIN) 2 MG tablet TAKE 1 TABLET (2 MG TOTAL) BY MOUTH 3 (THREE) TIMES DAILY BEFORE MEALS.  . tamsulosin (FLOMAX) 0.4 MG CAPS capsule TAKE 1 CAPSULE (0.4 MG TOTAL) DAILY BY MOUTH.  . Vitamin D, Ergocalciferol, (DRISDOL) 50000 units CAPS capsule TAKE 1 CAPSULE (50,000 UNITS TOTAL) BY MOUTH ONCE A WEEK.  Marland Kitchen zolpidem (AMBIEN) 10 MG tablet Take 1 tablet (10 mg total) by mouth at bedtime as needed for sleep.   . [DISCONTINUED] cephALEXin (KEFLEX) 500 MG capsule Take 1 capsule (500 mg total) by mouth 3 (three) times daily.     Allergies  Allergen Reactions  . Asa [Aspirin] Other (See Comments)    "stomach burning", "I can take baby aspirin"  . Prednisone Other (See Comments)    MD told it gavepancreatitis  and almost died    Social History   Socioeconomic History  . Marital status: Married    Spouse name: Not on file  . Number of children: Not on file  . Years of education: Not on file  . Highest education level: Not on file  Occupational History  . Occupation: disabled  Social Needs  . Financial resource strain: Not on file  . Food insecurity    Worry: Not on file    Inability: Not on file  . Transportation needs    Medical: Not on file    Non-medical: Not on file  Tobacco Use  . Smoking status: Former Smoker    Packs/day: 1.00    Years: 20.00    Pack years: 20.00    Types: Cigarettes    Quit date: 07/18/2016    Years since quitting: 2.9  . Smokeless tobacco: Never Used  Substance and Sexual Activity  . Alcohol use: Yes    Alcohol/week: 10.0 standard drinks    Types: 10 Glasses of wine per week    Comment: 1 glass of wine daily  . Drug use: No  . Sexual activity: Yes  Lifestyle  . Physical activity    Days per week: Not on file    Minutes per session: Not on file  . Stress: Not on file  Relationships  . Social Herbalist on phone: Not on file    Gets together: Not on file    Attends religious service: Not on file    Active member of club or organization: Not on file    Attends meetings of clubs or organizations: Not on file    Relationship status: Not on file  . Intimate partner violence    Fear of current or ex partner: Not on file    Emotionally abused: Not on file    Physically abused: Not on file    Forced sexual activity: Not on file  Other Topics Concern  . Not on file  Social History Narrative   Lives with husband in a one story home.  Has  a son and a daughter but the daughter passed away at the age of 5.  On disability.  College degree from [employer].  She was a Music therapist.     Review of Systems: General: negative for chills, fever, night sweats or weight changes.  Cardiovascular: negative for chest pain, dyspnea on exertion, edema, orthopnea, palpitations, paroxysmal nocturnal  dyspnea or shortness of breath Dermatological: negative for rash Respiratory: negative for cough or wheezing Urologic: negative for hematuria Abdominal: negative for nausea, vomiting, diarrhea, bright red blood per rectum, melena, or hematemesis Neurologic: negative for visual changes, syncope, or dizziness All other systems reviewed and are otherwise negative except as noted above.    Blood pressure 114/63, pulse 84, height 5\' 5"  (1.651 m), weight 161 lb (73 kg), SpO2 93 %.  General appearance: alert and no distress Neck: no adenopathy, no carotid bruit, no JVD, supple, symmetrical, trachea midline and thyroid not enlarged, symmetric, no tenderness/mass/nodules Lungs: clear to auscultation bilaterally Heart: regular rate and rhythm, S1, S2 normal, no murmur, click, rub or gallop Extremities: extremities normal, atraumatic, no cyanosis or edema Pulses: 2+ and symmetric Skin: Skin color, texture, turgor normal. No rashes or lesions Neurologic: Alert and oriented X 3, normal strength and tone. Normal symmetric reflexes. Normal coordination and gait  EKG sinus rhythm at 84 with poor R wave progression and low limb voltage.  I personally reviewed this EKG.  ASSESSMENT AND PLAN:   Diabetic foot ulcer (HCC) Ms. Fencl was referred to me by Dr. Adline Potter, podiatry, for a slowly healing ulcer on her left heel.  She has been nonambulatory for last 20 years after stroke.  She is for the most part wheelchair-bound.  She is had a small left heel ulcer for last 2 to 3 months and has been evaluated by Dr. Ardelle Anton over the last several weeks with noticeable  improvement.  We did have lower extremity Dopplers performed in our office 06/04/2019 revealing a right ABI of 1.1 and a left of 0.86 with a high-frequency signal in her mid left SFA.  At this point, given the fact that it slowly improving I am going to defer performing intervention.  I discussed this with Dr. 08/04/2019.  Should the wound get worse she would be a good candidate for percutaneous intervention.  I will see her back in 4 to 6 weeks.      Shella Spearing MD FACP,FACC,FAHA, [facility] 06/24/2019 1:57 PM

## 2019-06-24 NOTE — Patient Instructions (Signed)
Medication Instructions:  Your physician recommends that you continue on your current medications as directed. Please refer to the Current Medication list given to you today.  If you need a refill on your cardiac medications before your next appointment, please call your pharmacy.   Lab work: NONE If you have labs (blood work) drawn today and your tests are completely normal, you will receive your results only by: Marland Kitchen MyChart Message (if you have MyChart) OR . A paper copy in the mail If you have any lab test that is abnormal or we need to change your treatment, we will call you to review the results.  Testing/Procedures: NONE  Follow-Up: At [facility], you and your health needs are our priority.  As part of our continuing mission to provide you with exceptional heart care, we have created designated Provider Care Teams.  These Care Teams include your primary Cardiologist (physician) and Advanced Practice Providers (APPs -  Physician Assistants and Nurse Practitioners) who all work together to provide you with the care you need, when you need it. You will need a follow up appointment in 4-6 weeks with Dr. Quay Burow.

## 2019-07-02 ENCOUNTER — Other Ambulatory Visit: Payer: Self-pay

## 2019-07-02 ENCOUNTER — Ambulatory Visit (INDEPENDENT_AMBULATORY_CARE_PROVIDER_SITE_OTHER): Payer: Medicare Other | Admitting: Podiatry

## 2019-07-02 DIAGNOSIS — L97421 Non-pressure chronic ulcer of left heel and midfoot limited to breakdown of skin: Secondary | ICD-10-CM | POA: Diagnosis not present

## 2019-07-02 DIAGNOSIS — I739 Peripheral vascular disease, unspecified: Secondary | ICD-10-CM

## 2019-07-13 DIAGNOSIS — M47816 Spondylosis without myelopathy or radiculopathy, lumbar region: Secondary | ICD-10-CM | POA: Diagnosis not present

## 2019-07-13 DIAGNOSIS — Z79891 Long term (current) use of opiate analgesic: Secondary | ICD-10-CM | POA: Diagnosis not present

## 2019-07-13 DIAGNOSIS — M79672 Pain in left foot: Secondary | ICD-10-CM | POA: Diagnosis not present

## 2019-07-13 DIAGNOSIS — G894 Chronic pain syndrome: Secondary | ICD-10-CM | POA: Diagnosis not present

## 2019-07-16 ENCOUNTER — Inpatient Hospital Stay (HOSPITAL_COMMUNITY)
Admission: EM | Admit: 2019-07-16 | Discharge: 2019-07-18 | DRG: 056 | Disposition: A | Discharge status: Home Health | Payer: Medicare Other | Attending: Internal Medicine | Admitting: Internal Medicine

## 2019-07-16 ENCOUNTER — Encounter (HOSPITAL_COMMUNITY): Payer: Self-pay | Admitting: Emergency Medicine

## 2019-07-16 ENCOUNTER — Other Ambulatory Visit: Payer: Self-pay

## 2019-07-16 ENCOUNTER — Emergency Department (HOSPITAL_COMMUNITY): Payer: Medicare Other

## 2019-07-16 DIAGNOSIS — G40909 Epilepsy, unspecified, not intractable, without status epilepticus: Secondary | ICD-10-CM | POA: Diagnosis present

## 2019-07-16 DIAGNOSIS — Z20828 Contact with and (suspected) exposure to other viral communicable diseases: Secondary | ICD-10-CM | POA: Diagnosis present

## 2019-07-16 DIAGNOSIS — I69398 Other sequelae of cerebral infarction: Secondary | ICD-10-CM | POA: Diagnosis not present

## 2019-07-16 DIAGNOSIS — E119 Type 2 diabetes mellitus without complications: Secondary | ICD-10-CM

## 2019-07-16 DIAGNOSIS — R269 Unspecified abnormalities of gait and mobility: Secondary | ICD-10-CM | POA: Diagnosis not present

## 2019-07-16 DIAGNOSIS — R0902 Hypoxemia: Secondary | ICD-10-CM | POA: Diagnosis not present

## 2019-07-16 DIAGNOSIS — Z79899 Other long term (current) drug therapy: Secondary | ICD-10-CM

## 2019-07-16 DIAGNOSIS — E1142 Type 2 diabetes mellitus with diabetic polyneuropathy: Secondary | ICD-10-CM | POA: Diagnosis present

## 2019-07-16 DIAGNOSIS — M1990 Unspecified osteoarthritis, unspecified site: Secondary | ICD-10-CM | POA: Diagnosis present

## 2019-07-16 DIAGNOSIS — Z823 Family history of stroke: Secondary | ICD-10-CM

## 2019-07-16 DIAGNOSIS — Z888 Allergy status to other drugs, medicaments and biological substances status: Secondary | ICD-10-CM

## 2019-07-16 DIAGNOSIS — R000 Tachycardia, unspecified: Secondary | ICD-10-CM | POA: Diagnosis not present

## 2019-07-16 DIAGNOSIS — R531 Weakness: Secondary | ICD-10-CM | POA: Diagnosis not present

## 2019-07-16 DIAGNOSIS — J9601 Acute respiratory failure with hypoxia: Secondary | ICD-10-CM | POA: Diagnosis present

## 2019-07-16 DIAGNOSIS — M810 Age-related osteoporosis without current pathological fracture: Secondary | ICD-10-CM | POA: Diagnosis present

## 2019-07-16 DIAGNOSIS — Z886 Allergy status to analgesic agent status: Secondary | ICD-10-CM

## 2019-07-16 DIAGNOSIS — R0602 Shortness of breath: Secondary | ICD-10-CM | POA: Diagnosis not present

## 2019-07-16 DIAGNOSIS — Z87891 Personal history of nicotine dependence: Secondary | ICD-10-CM

## 2019-07-16 DIAGNOSIS — R569 Unspecified convulsions: Secondary | ICD-10-CM

## 2019-07-16 DIAGNOSIS — Z7984 Long term (current) use of oral hypoglycemic drugs: Secondary | ICD-10-CM

## 2019-07-16 DIAGNOSIS — Z87440 Personal history of urinary (tract) infections: Secondary | ICD-10-CM

## 2019-07-16 DIAGNOSIS — G20 Parkinson's disease: Secondary | ICD-10-CM | POA: Diagnosis not present

## 2019-07-16 DIAGNOSIS — E785 Hyperlipidemia, unspecified: Secondary | ICD-10-CM | POA: Diagnosis present

## 2019-07-16 DIAGNOSIS — K219 Gastro-esophageal reflux disease without esophagitis: Secondary | ICD-10-CM | POA: Diagnosis present

## 2019-07-16 DIAGNOSIS — J449 Chronic obstructive pulmonary disease, unspecified: Secondary | ICD-10-CM | POA: Diagnosis not present

## 2019-07-16 DIAGNOSIS — F329 Major depressive disorder, single episode, unspecified: Secondary | ICD-10-CM | POA: Diagnosis present

## 2019-07-16 DIAGNOSIS — F419 Anxiety disorder, unspecified: Secondary | ICD-10-CM | POA: Diagnosis present

## 2019-07-16 DIAGNOSIS — E1100 Type 2 diabetes mellitus with hyperosmolarity without nonketotic hyperglycemic-hyperosmolar coma (NKHHC): Secondary | ICD-10-CM

## 2019-07-16 DIAGNOSIS — Z7982 Long term (current) use of aspirin: Secondary | ICD-10-CM

## 2019-07-16 DIAGNOSIS — Z8249 Family history of ischemic heart disease and other diseases of the circulatory system: Secondary | ICD-10-CM

## 2019-07-16 DIAGNOSIS — G894 Chronic pain syndrome: Secondary | ICD-10-CM | POA: Diagnosis present

## 2019-07-16 DIAGNOSIS — R5381 Other malaise: Secondary | ICD-10-CM | POA: Diagnosis present

## 2019-07-16 LAB — CBC
HCT: 42 % (ref 36.0–46.0)
Hemoglobin: 12.8 g/dL (ref 12.0–15.0)
MCH: 27.3 pg (ref 26.0–34.0)
MCHC: 30.5 g/dL (ref 30.0–36.0)
MCV: 89.6 fL (ref 80.0–100.0)
Platelets: 217 10*3/uL (ref 150–400)
RBC: 4.69 MIL/uL (ref 3.87–5.11)
RDW: 17.1 % — ABNORMAL HIGH (ref 11.5–15.5)
WBC: 4.7 10*3/uL (ref 4.0–10.5)
nRBC: 0 % (ref 0.0–0.2)

## 2019-07-16 LAB — URINALYSIS, ROUTINE W REFLEX MICROSCOPIC
Bacteria, UA: NONE SEEN
Bilirubin Urine: NEGATIVE
Glucose, UA: 50 mg/dL — AB
Hgb urine dipstick: NEGATIVE
Ketones, ur: NEGATIVE mg/dL
Nitrite: NEGATIVE
Protein, ur: NEGATIVE mg/dL
Specific Gravity, Urine: 1.006 (ref 1.005–1.030)
pH: 6 (ref 5.0–8.0)

## 2019-07-16 LAB — SARS CORONAVIRUS 2 BY RT PCR (HOSPITAL ORDER, PERFORMED IN ~~LOC~~ HOSPITAL LAB): SARS Coronavirus 2: NEGATIVE

## 2019-07-16 LAB — COMPREHENSIVE METABOLIC PANEL
ALT: 13 U/L (ref 0–44)
AST: 18 U/L (ref 15–41)
Albumin: 3.7 g/dL (ref 3.5–5.0)
Alkaline Phosphatase: 64 U/L (ref 38–126)
Anion gap: 12 (ref 5–15)
BUN: 7 mg/dL — ABNORMAL LOW (ref 8–23)
CO2: 28 mmol/L (ref 22–32)
Calcium: 8.4 mg/dL — ABNORMAL LOW (ref 8.9–10.3)
Chloride: 98 mmol/L (ref 98–111)
Creatinine, Ser: 0.48 mg/dL (ref 0.44–1.00)
GFR calc Af Amer: >60 mL/min (ref >60)
GFR calc non Af Amer: >60 mL/min (ref >60)
Glucose, Bld: 166 mg/dL — ABNORMAL HIGH (ref 70–99)
Potassium: 4.3 mmol/L (ref 3.5–5.1)
Sodium: 138 mmol/L (ref 135–145)
Total Bilirubin: 0.2 mg/dL — ABNORMAL LOW (ref 0.3–1.2)
Total Protein: 6.8 g/dL (ref 6.5–8.1)

## 2019-07-16 LAB — BRAIN NATRIURETIC PEPTIDE: B Natriuretic Peptide: 59.3 pg/mL (ref 0.0–100.0)

## 2019-07-16 LAB — CBG MONITORING, ED
Glucose-Capillary: 157 mg/dL — ABNORMAL HIGH (ref 70–99)
Glucose-Capillary: 153 mg/dL — ABNORMAL HIGH (ref 70–99)
Glucose-Capillary: 148 mg/dL — ABNORMAL HIGH (ref 70–99)
Glucose-Capillary: 163 mg/dL — ABNORMAL HIGH (ref 70–99)

## 2019-07-16 LAB — GLUCOSE, CAPILLARY: Glucose-Capillary: 178 mg/dL — ABNORMAL HIGH (ref 70–99)

## 2019-07-16 LAB — D-DIMER, QUANTITATIVE: D-Dimer, Quant: 0.37 ug/mL-FEU (ref 0.00–0.50)

## 2019-07-16 MED ORDER — SODIUM CHLORIDE 0.9 % IV SOLN
500.0000 mg | Freq: Once | INTRAVENOUS | Status: AC
  Administered 2019-07-16: 500 mg via INTRAVENOUS
  Filled 2019-07-16: qty 500

## 2019-07-16 MED ORDER — IPRATROPIUM-ALBUTEROL 0.5-2.5 (3) MG/3ML IN SOLN: 3.0000 mL | RESPIRATORY_TRACT | Status: DC | PRN

## 2019-07-16 MED ORDER — INSULIN ASPART 100 UNIT/ML ~~LOC~~ SOLN
0.0000 [IU] | Freq: Three times a day (TID) | SUBCUTANEOUS | Status: DC
  Administered 2019-07-16: 1 [IU] via SUBCUTANEOUS
  Administered 2019-07-16 – 2019-07-17 (×3): 2 [IU] via SUBCUTANEOUS
  Administered 2019-07-18: 1 [IU] via SUBCUTANEOUS
  Administered 2019-07-18: 2 [IU] via SUBCUTANEOUS
  Filled 2019-07-16: qty 0.09

## 2019-07-16 MED ORDER — AEROCHAMBER Z-STAT PLUS/MEDIUM MISC
1.0000 | Freq: Once | Status: AC
  Administered 2019-07-16: 1
  Filled 2019-07-16: qty 1

## 2019-07-16 MED ORDER — IPRATROPIUM-ALBUTEROL 0.5-2.5 (3) MG/3ML IN SOLN
3.0000 mL | Freq: Four times a day (QID) | RESPIRATORY_TRACT | Status: DC
  Administered 2019-07-16 (×2): 3 mL via RESPIRATORY_TRACT
  Filled 2019-07-16 (×2): qty 3

## 2019-07-16 MED ORDER — HYDROMORPHONE HCL 2 MG PO TABS
2.0000 mg | ORAL_TABLET | Freq: Two times a day (BID) | ORAL | Status: DC | PRN
  Administered 2019-07-16 – 2019-07-18 (×3): 2 mg via ORAL
  Filled 2019-07-16 (×3): qty 1

## 2019-07-16 MED ORDER — SODIUM CHLORIDE 0.9 % IV SOLN
500.0000 mg | INTRAVENOUS | Status: DC
  Administered 2019-07-17: 500 mg via INTRAVENOUS
  Filled 2019-07-16: qty 500

## 2019-07-16 MED ORDER — ACETAMINOPHEN 325 MG PO TABS: 650.0000 mg | ORAL_TABLET | Freq: Four times a day (QID) | ORAL | Status: DC | PRN

## 2019-07-16 MED ORDER — GABAPENTIN 100 MG PO CAPS
200.0000 mg | ORAL_CAPSULE | Freq: Three times a day (TID) | ORAL | Status: DC
  Administered 2019-07-16 – 2019-07-18 (×7): 200 mg via ORAL
  Filled 2019-07-16 (×7): qty 2

## 2019-07-16 MED ORDER — ALBUTEROL SULFATE HFA 108 (90 BASE) MCG/ACT IN AERS
8.0000 | INHALATION_SPRAY | Freq: Once | RESPIRATORY_TRACT | Status: AC
  Administered 2019-07-16: 8 via RESPIRATORY_TRACT
  Filled 2019-07-16: qty 6.7

## 2019-07-16 MED ORDER — ASPIRIN EC 81 MG PO TBEC
81.0000 mg | DELAYED_RELEASE_TABLET | Freq: Every day | ORAL | Status: DC
  Administered 2019-07-16 – 2019-07-18 (×3): 81 mg via ORAL
  Filled 2019-07-16 (×4): qty 1

## 2019-07-16 MED ORDER — LORAZEPAM 1 MG PO TABS
1.0000 mg | ORAL_TABLET | Freq: Three times a day (TID) | ORAL | Status: DC | PRN
  Administered 2019-07-18 (×2): 1 mg via ORAL
  Filled 2019-07-16 (×2): qty 1

## 2019-07-16 MED ORDER — METOPROLOL TARTRATE 25 MG PO TABS
12.5000 mg | ORAL_TABLET | Freq: Two times a day (BID) | ORAL | Status: DC
  Administered 2019-07-16 – 2019-07-18 (×5): 12.5 mg via ORAL
  Filled 2019-07-16 (×5): qty 1

## 2019-07-16 MED ORDER — SODIUM CHLORIDE 0.9 % IV SOLN: 500.0000 mg | Freq: Once | INTRAVENOUS | Status: DC

## 2019-07-16 MED ORDER — CARBIDOPA-LEVODOPA 25-250 MG PO TABS
1.0000 | ORAL_TABLET | Freq: Three times a day (TID) | ORAL | Status: DC
  Administered 2019-07-16 – 2019-07-18 (×7): 1 via ORAL
  Filled 2019-07-16 (×9): qty 1

## 2019-07-16 MED ORDER — OCUVITE-LUTEIN PO CAPS
1.0000 | ORAL_CAPSULE | Freq: Every day | ORAL | Status: DC
  Filled 2019-07-16: qty 1

## 2019-07-16 MED ORDER — SODIUM CHLORIDE 0.9 % IV SOLN
1.0000 g | INTRAVENOUS | Status: DC
  Administered 2019-07-17: 1 g via INTRAVENOUS
  Filled 2019-07-16: qty 1

## 2019-07-16 MED ORDER — TAMSULOSIN HCL 0.4 MG PO CAPS
0.4000 mg | ORAL_CAPSULE | Freq: Every day | ORAL | Status: DC
  Administered 2019-07-16 – 2019-07-18 (×3): 0.4 mg via ORAL
  Filled 2019-07-16 (×3): qty 1

## 2019-07-16 MED ORDER — PROSIGHT PO TABS
1.0000 | ORAL_TABLET | Freq: Every day | ORAL | Status: DC
  Administered 2019-07-16 – 2019-07-18 (×3): 1 via ORAL
  Filled 2019-07-16 (×3): qty 1

## 2019-07-16 MED ORDER — SODIUM CHLORIDE 0.9 % IV SOLN: 1.0000 g | Freq: Once | INTRAVENOUS | Status: DC

## 2019-07-16 MED ORDER — SODIUM CHLORIDE 0.9 % IV SOLN
1.0000 g | Freq: Once | INTRAVENOUS | Status: AC
  Administered 2019-07-16: 1 g via INTRAVENOUS
  Filled 2019-07-16: qty 10

## 2019-07-16 MED ORDER — ONDANSETRON HCL 4 MG PO TABS: 4.0000 mg | ORAL_TABLET | Freq: Four times a day (QID) | ORAL | Status: DC | PRN

## 2019-07-16 MED ORDER — IPRATROPIUM-ALBUTEROL 0.5-2.5 (3) MG/3ML IN SOLN
3.0000 mL | Freq: Two times a day (BID) | RESPIRATORY_TRACT | Status: DC
  Filled 2019-07-16: qty 3

## 2019-07-16 MED ORDER — MIRTAZAPINE 15 MG PO TABS
15.0000 mg | ORAL_TABLET | Freq: Every day | ORAL | Status: DC
  Administered 2019-07-16 – 2019-07-17 (×2): 15 mg via ORAL
  Filled 2019-07-16 (×2): qty 1

## 2019-07-16 MED ORDER — ENOXAPARIN SODIUM 40 MG/0.4ML ~~LOC~~ SOLN
40.0000 mg | Freq: Every day | SUBCUTANEOUS | Status: DC
  Administered 2019-07-16 – 2019-07-17 (×2): 40 mg via SUBCUTANEOUS
  Filled 2019-07-16 (×2): qty 0.4

## 2019-07-16 MED ORDER — HYDROCODONE-ACETAMINOPHEN 5-325 MG PO TABS
1.0000 | ORAL_TABLET | ORAL | Status: DC | PRN
  Administered 2019-07-16 – 2019-07-17 (×4): 2 via ORAL
  Filled 2019-07-16 (×4): qty 2

## 2019-07-16 MED ORDER — SODIUM CHLORIDE 0.9% FLUSH
3.0000 mL | Freq: Once | INTRAVENOUS | Status: AC
  Administered 2019-07-16: 3 mL via INTRAVENOUS

## 2019-07-16 MED ORDER — ACETAMINOPHEN 650 MG RE SUPP: 650.0000 mg | Freq: Four times a day (QID) | RECTAL | Status: DC | PRN

## 2019-07-16 MED ORDER — ALBUTEROL SULFATE (2.5 MG/3ML) 0.083% IN NEBU: 3.0000 mL | INHALATION_SOLUTION | Freq: Four times a day (QID) | RESPIRATORY_TRACT | Status: DC | PRN

## 2019-07-16 MED ORDER — POLYETHYLENE GLYCOL 3350 17 G PO PACK: 17.0000 g | PACK | Freq: Every day | ORAL | Status: DC | PRN

## 2019-07-16 MED ORDER — ONDANSETRON HCL 4 MG/2ML IJ SOLN
4.0000 mg | Freq: Four times a day (QID) | INTRAMUSCULAR | Status: DC | PRN
  Administered 2019-07-17 (×2): 4 mg via INTRAVENOUS
  Filled 2019-07-16 (×3): qty 2

## 2019-07-16 NOTE — ED Notes (Signed)
Phlebotomy contacted to get blood

## 2019-07-16 NOTE — ED Notes (Signed)
Pure wick has been placed. Suction set to 45mmHg.  

## 2019-07-16 NOTE — ED Notes (Signed)
IV team unable to get blood. IV established.

## 2019-07-16 NOTE — ED Provider Notes (Signed)
[facility]-EMERGENCY DEPT Provider Note   CSN: [id number] Arrival date & time: 07/16/19  0045     History   Chief Complaint Chief Complaint  Patient presents with  . Weakness    HPI [name] is a 68 y.o. female with a history of COPD, seizures, diabetes mellitus type 2, and anoxic brain injury who presents to the emergency department by EMS with a chief complaint of generalized weakness.  The patient reports that she has been feeling generally weak and unwell for the last few days.  She also notes that she has been increasingly short of breath over the last 3 days.  Although she denies a cough, the patient has a wet sounding cough noted while conducting this interview.  She was found to be hypoxic at 88% on room air that improved to 93% with 2 L [place].  She does not wear supplemental oxygen at home.  She denies fever, chills, chest pain, abdominal pain, nausea, vomiting, diarrhea, constipation, dysuria.  Although she endorsed back pain to triage staff, she denies this complaint to me.  She also reports that she has being followed by podiatry for a diabetic wound ulcer on her left heel.  She states "I feel like I have laid in bed most of the last few weeks."  She reports that she lives at home with her husband.  She ambulates with a walker at baseline.  She reports that she is a previous smoker, but quit smoking 3 years ago.  No known or suspected COVID-19 contacts.     The history is provided by the patient and medical records. No language interpreter was used.    Past Medical History:  Diagnosis Date  . Anoxic brain injury (HCC)   . Anxiety   . Aortic atherosclerosis (HCC) 10/04/2017  . Cellulitis and abscess of other specified site 10/11/2010   Qualifier: Diagnosis of  By: Plotnikov MD, Georgina Quint   . Chronic neck pain   . Chronic pain in shoulder   . COPD (chronic obstructive pulmonary disease) (HCC)   . Depression   . Fracture of left humerus 06/24/2014  .  Gait disorder   . GERD (gastroesophageal reflux disease)   . Hernia of abdominal cavity   . 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 Hyperlipidemia   . LBP (low back pain)   . Neuropathy   . Osteoarthritis   . Seizure disorder (HCC)   . Seizures (HCC)   . Type II or unspecified type diabetes mellitus without mention of complication, not stated as uncontrolled 2010  . UTI (urinary tract infection) 10/2017  . Vitamin B 12 deficiency 2011    Patient Active Problem List   Diagnosis Date Noted  . Acute respiratory failure with hypoxia (HCC) 07/16/2019  . Knee pain, left 10/21/2018  . Diabetic foot ulcer (HCC) 08/19/2018  . Weakness 10/04/2017  . Acute encephalopathy 10/04/2017  . Acute lower UTI 10/04/2017  . Aortic atherosclerosis (HCC) 10/04/2017  . Grief 09/23/2017  . Osteoporosis 04/16/2017  . Irregular heart beat 11/15/2016  . Hyperglycemia   . Oral thrush 07/18/2016  . Odynophagia 07/18/2016  . Anoxic brain injury (HCC) 12/29/2015  . Gait disorder 12/28/2015  . Well adult exam 12/08/2014  . Fracture of left humerus 06/24/2014  . Humerus fracture 06/24/2014  . Left elbow pain 11/03/2013  . Ankle ulcer (HCC) 04/02/2012  . Decubitus ulcer of coccyx 05/21/2011  . B12 deficiency 04/04/2010  . HYPERLIPIDEMIA 04/04/2010  .  Anxiety state 11/14/2009  . Vitamin D deficiency 10/21/2009  . DM2 (diabetes mellitus, type 2) (HCC) 07/07/2009  . COPD (chronic obstructive pulmonary disease) (HCC) 02/25/2009  . OSTEOARTHRITIS 02/25/2009  . LOW BACK PAIN 02/25/2009  . TOBACCO USER 01/13/2009  . DEPRESSION 02/26/2007  . PAIN, CHRONIC NEC 02/26/2007  . Venous (peripheral) insufficiency 02/26/2007  . GERD 02/26/2007  . HERNIA, INCISIONAL 02/26/2007  . SHOULDER PAIN, LEFT 02/26/2007  . 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 INSOMNIA 02/26/2007  . Abnormality of gait 02/26/2007  . LEG EDEMA, CHRONIC 02/26/2007     Past Surgical History:  Procedure Laterality Date  . COLONOSCOPY    . DIAGNOSTIC LAPAROSCOPY     PMH: Exploratory lap  . HERNIA REPAIR     incisional  . ORIF HUMERUS FRACTURE Left 06/24/2014   Procedure: LEFT OPEN REDUCTION INTERNAL FIXATION (ORIF) PROXIMAL HUMERUS FRACTURE;  Surgeon: Eulas Post, MD;  Location: [facility] OR;  Service: Orthopedics;  Laterality: Left;  . PANCREATECTOMY    . TONSILLECTOMY    . TUBAL LIGATION       OB History   No obstetric history on file.      Home Medications    Prior to Admission medications   Medication Sig Start Date End Date Taking? Authorizing Provider  albuterol (PROVENTIL HFA;VENTOLIN HFA) 108 (90 Base) MCG/ACT inhaler Inhale 1-2 puffs into the lungs every 6 (six) hours as needed for wheezing or shortness of breath. 08/20/17  Yes Myrlene Broker, MD  aspirin 81 MG EC tablet Take 81 mg by mouth daily.     Yes [provider]  carbidopa-levodopa (SINEMET IR) 25-250 MG tablet TAKE 1 TABLET BY MOUTH 3 (THREE) TIMES DAILY. PATIENT NEEDS OFFICE VISIT BEFORE REFILLS WILL BE GIVEN Patient taking differently: Take 1 tablet by mouth 3 (three) times daily.  05/21/19  Yes Plotnikov, Georgina Quint, MD  gabapentin (NEURONTIN) 100 MG capsule Take 200 mg by mouth 3 (three) times daily.  09/05/18  Yes [provider]  HYDROmorphone (DILAUDID) 2 MG tablet Take 2-3 tablets (4-6 mg total) by mouth every 6 (six) hours as needed for moderate pain or severe pain. Patient taking differently: Take 2 mg by mouth every 12 (twelve) hours.  06/24/14  Yes Teryl Lucy, MD  LORazepam (ATIVAN) 2 MG tablet TAKE 1 TABLET BY MOUTH THREE TIMES A DAY Patient taking differently: Take 2 mg by mouth 3 (three) times daily.  03/24/19  Yes Plotnikov, Georgina Quint, MD  metFORMIN (GLUCOPHAGE-XR) 750 MG 24 hr tablet Take 750 mg by mouth 2 (two) times daily.  03/24/19  Yes [provider]  metoprolol tartrate (LOPRESSOR) 25 MG tablet TAKE ONE-HALF TABLET (12.5 MG  TOTAL) BY MOUTH 2 (TWO) TIMES DAILY Patient taking differently: Take 12.5 mg by mouth 2 (two) times daily.  11/05/18  Yes Plotnikov, Georgina Quint, MD  mirtazapine (REMERON) 15 MG tablet TAKE 1 TABLET BY MOUTH AT BEDTIME AT 6-8 PM Patient taking differently: Take 15 mg by mouth at bedtime.  12/19/18  Yes Plotnikov, Georgina Quint, MD  multivitamin-lutein (OCUVITE-LUTEIN) CAPS capsule Take 1 capsule by mouth daily.   Yes [provider]  repaglinide (PRANDIN) 2 MG tablet TAKE 1 TABLET (2 MG TOTAL) BY MOUTH 3 (THREE) TIMES DAILY BEFORE MEALS. 03/26/19  Yes Plotnikov, Georgina Quint, MD  tamsulosin (FLOMAX) 0.4 MG CAPS capsule TAKE 1 CAPSULE (0.4 MG TOTAL) DAILY BY MOUTH. 08/04/18  Yes Plotnikov, Georgina Quint, MD  b complex vitamins tablet Take 1  tablet by mouth daily. Patient not taking: Reported on 07/16/2019 10/03/16   Plotnikov, Georgina Quint, MD  benzonatate (TESSALON) 200 MG capsule Take 1 capsule (200 mg total) by mouth 3 (three) times daily as needed for cough. Patient not taking: Reported on 07/16/2019 03/20/18   Plotnikov, Georgina Quint, MD  calcium carbonate (OSCAL) 1500 (600 Ca) MG TABS tablet Take 1 tablet (1,500 mg total) by mouth 2 (two) times daily with a meal. Patient not taking: Reported on 07/16/2019 07/31/16   Nche, Bonna Gains, NP  cefdinir (OMNICEF) 300 MG capsule Take 1 capsule (300 mg total) by mouth 2 (two) times daily. Patient not taking: Reported on 07/16/2019 08/19/18   Plotnikov, Georgina Quint, MD  cholecalciferol (VITAMIN D) 1000 UNITS tablet Take 2 tablets (2,000 Units total) by mouth daily. Patient not taking: Reported on 07/16/2019 03/15/15   Plotnikov, Georgina Quint, MD  diclofenac sodium (VOLTAREN) 1 % GEL APPLY 2 G TOPICALLY 4 (FOUR) TIMES DAILY. RUB INTO AFFECTED AREA OF FOOT 2 TO 4 TIMES DAILY Patient not taking: Reported on 07/16/2019 01/22/19   Plotnikov, Georgina Quint, MD  doxycycline (VIBRA-TABS) 100 MG tablet Take 1 tablet (100 mg total) by mouth 2 (two) times daily. Patient not taking:  Reported on 07/16/2019 06/02/19   Vivi Barrack, DPM  Fluticasone-Umeclidin-Vilant (TRELEGY ELLIPTA) 100-62.5-25 MCG/INH AEPB Inhale 1 spray into the lungs daily. Patient not taking: Reported on 07/16/2019 08/19/18   Plotnikov, Georgina Quint, MD  glucose blood (ONETOUCH VERIO) test strip Use to check blood sugars twice a day. Dx E11.9 05/02/17   Plotnikov, Georgina Quint, MD  metFORMIN (GLUCOPHAGE) 500 MG tablet Take 1 tablet (500 mg total) by mouth 2 (two) times daily with a meal. Patient not taking: Reported on 07/16/2019 03/24/19   Plotnikov, Georgina Quint, MD  mupirocin ointment (BACTROBAN) 2 % Apply 1 application topically 2 (two) times daily. Patient not taking: Reported on 07/16/2019 08/26/18   Vivi Barrack, DPM  nitrofurantoin (MACRODANTIN) 100 MG capsule Take 1 capsule (100 mg total) by mouth every 12 (twelve) hours. Patient not taking: Reported on 07/16/2019 10/06/17   Rhetta Mura, MD  [facility] DELICA LANCETS 33G MISC Use to help check blood sugars twice a day. Dx E11.9 10/23/16   Plotnikov, Georgina Quint, MD  promethazine (PHENERGAN) 12.5 MG tablet Take 1 tablet (12.5 mg total) by mouth every 6 (six) hours as needed. for nausea Patient not taking: Reported on 07/16/2019 08/02/16   Nche, Bonna Gains, NP  Vitamin D, Ergocalciferol, (DRISDOL) 50000 units CAPS capsule TAKE 1 CAPSULE (50,000 UNITS TOTAL) BY MOUTH ONCE A WEEK. Patient not taking: Reported on 07/16/2019 03/28/16   Plotnikov, Georgina Quint, MD  zolpidem (AMBIEN) 10 MG tablet Take 1 tablet (10 mg total) by mouth at bedtime as needed for sleep. Patient not taking: Reported on 07/16/2019 12/16/17   Plotnikov, Georgina Quint, MD    Family History Family History  Problem Relation Age of Onset  . Stroke Mother   . Heart disease Father 73       MI  . Hypertension Other     Social History Social History   Tobacco Use  . Smoking status: Former Smoker    Packs/day: 1.00    Years: 20.00    Pack years: 20.00    Types: Cigarettes    Quit  date: 07/18/2016    Years since quitting: 2.9  . Smokeless tobacco: Never Used  Substance Use Topics  . Alcohol use: Yes    Alcohol/week: 10.0 standard drinks  Types: 10 Glasses of wine per week    Comment: 1 glass of wine daily  . Drug use: No     Allergies   Asa [aspirin] and Prednisone   Review of Systems Review of Systems  Constitutional: Negative for activity change, chills and fever.       Malaise  HENT: Negative for congestion and sore throat.   Respiratory: Positive for cough and shortness of breath. Negative for wheezing.   Cardiovascular: Negative for chest pain.  Gastrointestinal: Negative for abdominal pain, diarrhea, nausea and vomiting.  Genitourinary: Negative for dysuria, frequency, hematuria, urgency, vaginal bleeding, vaginal discharge and vaginal pain.  Musculoskeletal: Negative for back pain, joint swelling, myalgias, neck pain and neck stiffness.  Skin: Positive for wound. Negative for rash.  Allergic/Immunologic: Negative for immunocompromised state.  Neurological: Positive for weakness (generalized). Negative for dizziness, seizures, syncope, numbness and headaches.  Psychiatric/Behavioral: Negative for confusion.     Physical Exam Updated Vital Signs BP 134/61   Pulse 84   Temp 98.2 F (36.8 C) (Oral)   Resp 12   Ht 5\' 5"  (1.651 m)   Wt 74.8 kg   SpO2 94%   BMI 27.46 kg/m   Physical Exam Vitals signs and nursing note reviewed.  Constitutional:      General: She is not in acute distress.    Appearance: She is not ill-appearing, toxic-appearing or diaphoretic.  HENT:     Head: Normocephalic.     Mouth/Throat:     Mouth: Mucous membranes are moist.  Eyes:     Extraocular Movements: Extraocular movements intact.     Conjunctiva/sclera: Conjunctivae normal.     Pupils: Pupils are equal, round, and reactive to light.  Neck:     Musculoskeletal: Neck supple.  Cardiovascular:     Rate and Rhythm: Normal rate and regular rhythm.      Heart sounds: No murmur. No friction rub. No gallop.   Pulmonary:     Effort: Pulmonary effort is normal. No respiratory distress.     Comments: Rhoncherous cough.  She has coarse breath sounds in the bilateral bases, but no overt rhonchi, rales, or wheezing.  No wheezes noted.  No increased work of breathing, retractions, or accessory muscle use. Abdominal:     General: There is no distension.     Palpations: Abdomen is soft. There is no mass.     Tenderness: There is no abdominal tenderness. There is no right CVA tenderness, left CVA tenderness, guarding or rebound.     Hernia: No hernia is present.  Musculoskeletal:     Right lower leg: No edema.     Left lower leg: No edema.  Skin:    General: Skin is warm.     Findings: No rash.     Comments: There is a well-healing ulceration noted to the left heel.  No redness, warmth, or drainage.  Neurological:     Mental Status: She is alert.     Comments: She is oriented to year, city, and name.  She thinks the month is September.  Answers questions appropriately.  Follows simple commands.  Moves all 4 extremities spontaneously.  Sensation is intact and equal to the bilateral upper and lower extremities.  CN 2 through 12 are grossly intact. Ambulatory 2-3 steps with moderate to heavy assist.   Psychiatric:        Behavior: Behavior normal.      ED Treatments / Results  Labs (all labs ordered are listed, but only abnormal results  are displayed) Labs Reviewed  CBC - Abnormal; Notable for the following components:      Result Value   RDW 17.1 (*)    All other components within normal limits  URINALYSIS, ROUTINE W REFLEX MICROSCOPIC - Abnormal; Notable for the following components:   Color, Urine STRAW (*)    Glucose, UA 50 (*)    Leukocytes,Ua TRACE (*)    All other components within normal limits  COMPREHENSIVE METABOLIC PANEL - Abnormal; Notable for the following components:   Glucose, Bld 166 (*)    BUN 7 (*)    Calcium 8.4 (*)     Total Bilirubin 0.2 (*)    All other components within normal limits  CBG MONITORING, ED - Abnormal; Notable for the following components:   Glucose-Capillary 157 (*)    All other components within normal limits  CBG MONITORING, ED - Abnormal; Notable for the following components:   Glucose-Capillary 153 (*)    All other components within normal limits  SARS CORONAVIRUS 2 BY RT PCR (HOSPITAL ORDER, [facility])    EKG None  Radiology Dg Chest 2 View  Result Date: 07/16/2019 CLINICAL DATA:  Shortness of breath EXAM: CHEST - 2 VIEW COMPARISON:  08/19/2018 FINDINGS: Normal heart size and mediastinal contours. No acute infiltrate or edema. No effusion or pneumothorax. No acute osseous findings. Remote left humerus fracture repair. Postoperative left upper quadrant. IMPRESSION: No evidence of active disease Electronically Signed   By: Monte Fantasia M.D.   On: 07/16/2019 04:06    Procedures Procedures (including critical care time)  Medications Ordered in ED Medications  sodium chloride flush (NS) 0.9 % injection 3 mL (has no administration in time range)  cefTRIAXone (ROCEPHIN) 1 g in sodium chloride 0.9 % 100 mL IVPB (1 g Intravenous New Bag/Given 07/16/19 0654)  azithromycin (ZITHROMAX) 500 mg in sodium chloride 0.9 % 250 mL IVPB (has no administration in time range)  albuterol (VENTOLIN HFA) 108 (90 Base) MCG/ACT inhaler 8 puff (8 puffs Inhalation Given 07/16/19 0538)  aerochamber Z-Stat Plus/medium 1 each (1 each Other Given 07/16/19 2694)     Initial Impression / Assessment and Plan / ED Course  I have reviewed the triage vital signs and the nursing notes.  Pertinent labs & imaging results that were available during my care of the patient were reviewed by me and considered in my medical decision making (see chart for details).  Clinical Course as of Jul 16 703  Thu Jul 16, 2019  0455 Patient ambulates with a walker at home.  Attempted to stand and  ambulate the patient in the ER to check pulse ox.  Patient was only able to take 3 steps with significant assistance.  Oxygen saturation decreased to 83% with good waveform on the monitor.  Patient was returned to bed and placed on 2.5 L [place].    [MM]    Clinical Course User Index [MM] Margeaux Swantek A, PA-C       68 year old female with a history of COPD, seizures, diabetes mellitus type 2, and anoxic brain injury who presents to the emergency department by EMS with malaise, generalized weakness, shortness of breath.  On exam, she has a rhonchorous cough, but chest x-ray is unremarkable.  She is found to be hypoxic at 88% on room air that improved to 93% on nasal cannula.  Attempted ambulation on room air, and patient became hypoxic to 80%. On exam, she has no focal neurologic deficits.  The patient  was discussed with Dr. Nicanor Alcon, attending physician, who was in agreement with work up and plan.   Labs are notable for mild leukopenia 4.7, down from her baseline of around 8.0.  Given her symptoms, COVID-19 test was ordered, which was negative.  Labs are otherwise grossly unremarkable.  Urinalysis is not concerning for infection.   She does not have a history of heart failure does not appear volume overloaded.  Given acute onset of hypoxia, plan to order PE study, but patient had many IV attempts by staff and IV team and largest RV size was 22-gauge.  Will order VQ scan.  Consult to the hospitalist team for admission for acute hypoxia and Dr. Toniann Fail will admit. The patient appears reasonably stabilized for admission considering the current resources, flow, and capabilities available in the ED at this time, and I doubt any other [facility] requiring further screening and/or treatment in the ED prior to admission.  Final Clinical Impressions(s) / ED Diagnoses   Final diagnoses:  Hypoxia    ED Discharge Orders    None       Barkley Boards, PA-C 07/16/19 0704    Palumbo, April, MD 07/16/19 [phone]

## 2019-07-16 NOTE — ED Notes (Signed)
Unable to give lovenox before pt taken upstairs. None in pyxis and pharmacy never sent any up in time.

## 2019-07-16 NOTE — ED Triage Notes (Signed)
Patient brought by [facility] from home. Patient complaining of left leg and back pain. Patient is complaining of weakness. Patient has a hx of stroke.

## 2019-07-16 NOTE — ED Notes (Signed)
Attempted to ambulate pt with Judson Roch, RN and Ehrenfeld, PA. Pt scared to ambulate but able to take a few steps forward with moderate to heavy assist. Pt assisted back in bed and repositioned.

## 2019-07-16 NOTE — ED Notes (Signed)
Phlebotomy at bedside attempting to collect labs 

## 2019-07-16 NOTE — H&P (Signed)
History and Physical  SHAWN DANNENBERG VEH:[id number] DOB: 04/15/1951 DOA: 07/16/2019   Patient coming from: Home  Chief Complaint: Weakness and shortness of breath  HPI: [name] is a 68 y.o. female with medical history significant for Parkinson's disease, diabetes mellitus, COPD, seizures, anoxic brain injury, presents to the ED complaining of generalized weakness for the past few days, as well as worsening shortness of breath over the past few days as well.  Patient reported coughing, denies any chest pain, fever/chills, abdominal pain, nausea/vomiting, dysuria.  Patient also noted to have a diabetic wound ulcer on her left heel.  Patient lives at home with her husband, ambulates with a walker at baseline.  Patient reports smoking in the past, but quit about 3 years ago.  Does not use oxygen at home.   ED Course: Patient afebrile, noted to be hypoxic at 88% on room air, improved with 2 L of nasal cannula oxygen.  Labs largely unremarkable, UA unremarkable, D-dimer negative, COVID-19 negative.  Review of Systems: Review of systems are otherwise negative   Past Medical History:  Diagnosis Date  . Anoxic brain injury (HCC)   . Anxiety   . Aortic atherosclerosis (HCC) 10/04/2017  . Cellulitis and abscess of other specified site 10/11/2010   Qualifier: Diagnosis of  By: Plotnikov MD, Georgina Quint   . Chronic neck pain   . Chronic pain in shoulder   . COPD (chronic obstructive pulmonary disease) (HCC)   . Depression   . Fracture of left humerus 06/24/2014  . Gait disorder   . GERD (gastroesophageal reflux disease)   . Hernia of abdominal cavity   . 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 Hyperlipidemia   . LBP (low back pain)   . Neuropathy   . Osteoarthritis   . Seizure disorder (HCC)   . Seizures (HCC)   . Type II or unspecified type diabetes mellitus without mention of complication, not stated as uncontrolled 2010  . UTI (urinary tract infection)  10/2017  . Vitamin B 12 deficiency 2011   Past Surgical History:  Procedure Laterality Date  . COLONOSCOPY    . DIAGNOSTIC LAPAROSCOPY     PMH: Exploratory lap  . HERNIA REPAIR     incisional  . ORIF HUMERUS FRACTURE Left 06/24/2014   Procedure: LEFT OPEN REDUCTION INTERNAL FIXATION (ORIF) PROXIMAL HUMERUS FRACTURE;  Surgeon: Eulas Post, MD;  Location: [facility] OR;  Service: Orthopedics;  Laterality: Left;  . PANCREATECTOMY    . TONSILLECTOMY    . TUBAL LIGATION      Social History:  reports that she quit smoking about 2 years ago. Her smoking use included cigarettes. She has a 20.00 pack-year smoking history. She has never used smokeless tobacco. She reports current alcohol use of about 10.0 standard drinks of alcohol per week. She reports that she does not use drugs.   Allergies  Allergen Reactions  . Asa [Aspirin] Other (See Comments)    "stomach burning", "I can take baby aspirin"  . Prednisone Other (See Comments)    MD told it gavepancreatitis  and almost died    Family History  Problem Relation Age of Onset  . Stroke Mother   . Heart disease Father 29       MI  . Hypertension Other       Prior to Admission medications   Medication Sig Start Date End Date Taking? Authorizing Provider  albuterol (PROVENTIL HFA;VENTOLIN HFA) 108 (90 Base) MCG/ACT inhaler Inhale  1-2 puffs into the lungs every 6 (six) hours as needed for wheezing or shortness of breath. 08/20/17  Yes Hoyt Koch, MD  aspirin 81 MG EC tablet Take 81 mg by mouth daily.     Yes [provider]  carbidopa-levodopa (SINEMET IR) 25-250 MG tablet TAKE 1 TABLET BY MOUTH 3 (THREE) TIMES DAILY. PATIENT NEEDS OFFICE VISIT BEFORE REFILLS WILL BE GIVEN Patient taking differently: Take 1 tablet by mouth 3 (three) times daily.  05/21/19  Yes Plotnikov, Evie Lacks, MD  gabapentin (NEURONTIN) 100 MG capsule Take 200 mg by mouth 3 (three) times daily.  09/05/18  Yes [provider]  HYDROmorphone  (DILAUDID) 2 MG tablet Take 2-3 tablets (4-6 mg total) by mouth every 6 (six) hours as needed for moderate pain or severe pain. Patient taking differently: Take 2 mg by mouth every 12 (twelve) hours.  06/24/14  Yes Marchia Bond, MD  LORazepam (ATIVAN) 2 MG tablet TAKE 1 TABLET BY MOUTH THREE TIMES A DAY Patient taking differently: Take 2 mg by mouth 3 (three) times daily.  03/24/19  Yes Plotnikov, Evie Lacks, MD  metFORMIN (GLUCOPHAGE-XR) 750 MG 24 hr tablet Take 750 mg by mouth 2 (two) times daily.  03/24/19  Yes [provider]  metoprolol tartrate (LOPRESSOR) 25 MG tablet TAKE ONE-HALF TABLET (12.5 MG TOTAL) BY MOUTH 2 (TWO) TIMES DAILY Patient taking differently: Take 12.5 mg by mouth 2 (two) times daily.  11/05/18  Yes Plotnikov, Evie Lacks, MD  mirtazapine (REMERON) 15 MG tablet TAKE 1 TABLET BY MOUTH AT BEDTIME AT 6-8 PM Patient taking differently: Take 15 mg by mouth at bedtime.  12/19/18  Yes Plotnikov, Evie Lacks, MD  multivitamin-lutein (OCUVITE-LUTEIN) CAPS capsule Take 1 capsule by mouth daily.   Yes [provider]  repaglinide (PRANDIN) 2 MG tablet TAKE 1 TABLET (2 MG TOTAL) BY MOUTH 3 (THREE) TIMES DAILY BEFORE MEALS. 03/26/19  Yes Plotnikov, Evie Lacks, MD  tamsulosin (FLOMAX) 0.4 MG CAPS capsule TAKE 1 CAPSULE (0.4 MG TOTAL) DAILY BY MOUTH. 08/04/18  Yes Plotnikov, Evie Lacks, MD  b complex vitamins tablet Take 1 tablet by mouth daily. Patient not taking: Reported on 07/16/2019 10/03/16   Plotnikov, Evie Lacks, MD  benzonatate (TESSALON) 200 MG capsule Take 1 capsule (200 mg total) by mouth 3 (three) times daily as needed for cough. Patient not taking: Reported on 07/16/2019 03/20/18   Plotnikov, Evie Lacks, MD  calcium carbonate (OSCAL) 1500 (600 Ca) MG TABS tablet Take 1 tablet (1,500 mg total) by mouth 2 (two) times daily with a meal. Patient not taking: Reported on 07/16/2019 07/31/16   Nche, Charlene Brooke, NP  cefdinir (OMNICEF) 300 MG capsule Take 1 capsule (300 mg total)  by mouth 2 (two) times daily. Patient not taking: Reported on 07/16/2019 08/19/18   Plotnikov, Evie Lacks, MD  cholecalciferol (VITAMIN D) 1000 UNITS tablet Take 2 tablets (2,000 Units total) by mouth daily. Patient not taking: Reported on 07/16/2019 03/15/15   Plotnikov, Evie Lacks, MD  diclofenac sodium (VOLTAREN) 1 % GEL APPLY 2 G TOPICALLY 4 (FOUR) TIMES DAILY. RUB INTO AFFECTED AREA OF FOOT 2 TO 4 TIMES DAILY Patient not taking: Reported on 07/16/2019 01/22/19   Plotnikov, Evie Lacks, MD  doxycycline (VIBRA-TABS) 100 MG tablet Take 1 tablet (100 mg total) by mouth 2 (two) times daily. Patient not taking: Reported on 07/16/2019 06/02/19   Trula Slade, DPM  Fluticasone-Umeclidin-Vilant (TRELEGY ELLIPTA) 100-62.5-25 MCG/INH AEPB Inhale 1 spray into the lungs daily. Patient not  taking: Reported on 07/16/2019 08/19/18   Plotnikov, Georgina QuintAleksei V, MD  glucose blood (ONETOUCH VERIO) test strip Use to check blood sugars twice a day. Dx E11.9 05/02/17   Plotnikov, Georgina QuintAleksei V, MD  metFORMIN (GLUCOPHAGE) 500 MG tablet Take 1 tablet (500 mg total) by mouth 2 (two) times daily with a meal. Patient not taking: Reported on 07/16/2019 03/24/19   Plotnikov, Georgina QuintAleksei V, MD  mupirocin ointment (BACTROBAN) 2 % Apply 1 application topically 2 (two) times daily. Patient not taking: Reported on 07/16/2019 08/26/18   Vivi BarrackWagoner, Matthew R, DPM  nitrofurantoin (MACRODANTIN) 100 MG capsule Take 1 capsule (100 mg total) by mouth every 12 (twelve) hours. Patient not taking: Reported on 07/16/2019 10/06/17   Rhetta MuraSamtani, Jai-Gurmukh, MD  [facility] HospitalNETOUCH DELICA LANCETS 33G MISC Use to help check blood sugars twice a day. Dx E11.9 10/23/16   Plotnikov, Georgina QuintAleksei V, MD  promethazine (PHENERGAN) 12.5 MG tablet Take 1 tablet (12.5 mg total) by mouth every 6 (six) hours as needed. for nausea Patient not taking: Reported on 07/16/2019 08/02/16   Nche, Bonna Gainsharlotte Lum, NP  Vitamin D, Ergocalciferol, (DRISDOL) 50000 units CAPS capsule TAKE 1 CAPSULE (50,000  UNITS TOTAL) BY MOUTH ONCE A WEEK. Patient not taking: Reported on 07/16/2019 03/28/16   Plotnikov, Georgina QuintAleksei V, MD  zolpidem (AMBIEN) 10 MG tablet Take 1 tablet (10 mg total) by mouth at bedtime as needed for sleep. Patient not taking: Reported on 07/16/2019 12/16/17   Plotnikov, Georgina QuintAleksei V, MD    Physical Exam: BP (!) 128/59   Pulse 81   Temp 98.2 F (36.8 C) (Oral)   Resp 13   Ht 5\' 5"  (1.651 m)   Wt 74.8 kg   SpO2 98%   BMI 27.46 kg/m   General: NAD, chronically ill-appearing, lethargic Eyes: Normal ENT: Normal Neck: Supple Cardiovascular: S1, S2 present Respiratory: Coarse breath sounds bilaterally Abdomen: Soft, nontender, nondistended, bowel sounds present Skin: Noted left heel ulcer Musculoskeletal: No pedal edema bilaterally Psychiatric: Normal mood Neurologic: No obvious focal neurologic deficit noted          Labs on Admission:  Basic Metabolic Panel: Recent Labs  Lab 07/16/19 0410  NA 138  K 4.3  CL 98  CO2 28  GLUCOSE 166*  BUN 7*  CREATININE 0.48  CALCIUM 8.4*   Liver Function Tests: Recent Labs  Lab 07/16/19 0410  AST 18  ALT 13  ALKPHOS 64  BILITOT 0.2*  PROT 6.8  ALBUMIN 3.7   No results for input(s): LIPASE, AMYLASE in the last 168 hours. No results for input(s): AMMONIA in the last 168 hours. CBC: Recent Labs  Lab 07/16/19 0410  WBC 4.7  HGB 12.8  HCT 42.0  MCV 89.6  PLT 217   Cardiac Enzymes: No results for input(s): CKTOTAL, CKMB, CKMBINDEX, TROPONINI in the last 168 hours.  BNP (last 3 results) No results for input(s): BNP in the last 8760 hours.  ProBNP (last 3 results) No results for input(s): PROBNP in the last 8760 hours.  CBG: Recent Labs  Lab 07/16/19 0225 07/16/19 0337 07/16/19 1148  GLUCAP 157* 153* 148*    Radiological Exams on Admission: Dg Chest 2 View  Result Date: 07/16/2019 CLINICAL DATA:  Shortness of breath EXAM: CHEST - 2 VIEW COMPARISON:  08/19/2018 FINDINGS: Normal heart size and mediastinal  contours. No acute infiltrate or edema. No effusion or pneumothorax. No acute osseous findings. Remote left humerus fracture repair. Postoperative left upper quadrant. IMPRESSION: No evidence of active disease Electronically Signed   By:  Marnee Spring M.D.   On: 07/16/2019 04:06    EKG: Independently reviewed.  Normal sinus rhythm, no acute ST changes  Assessment/Plan Present on Admission: . Acute respiratory failure with hypoxia (HCC) . 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  Repeat Echo pending Chest x-ray unremarkable Will treat as CAP for now, continue azithromycin, ceftriaxone Supplemental oxygen, duo nebs as needed Monitor on the telemetry for now  Generalized weakness Likely due to above No focal neurologic deficits noted Management as above PT/OT  Diabetes mellitus type 2/peripheral neuropathy Uncontrolled Last A1c on 06/2018 was 8.8, repeat pending SSI, hypoglycemia protocol, Accu-Cheks Continue gabapentin Hold home oral hypoglycemics  Parkinson's disease Continue Sinemet  Chronic pain syndrome/seizures Switched home Dilaudid, lorazepam to as needed   Left heel ulcer Wound care consulted     DVT prophylaxis: Lovenox  Code Status: Full  Family Communication: None at bedside  Disposition Plan: To be determined  Consults called: None  Admission status: Observation     Briant Cedar MD [facility]   If 7PM-7AM, please contact night-coverage [email or web address]  07/16/2019, 3:39 PM

## 2019-07-16 NOTE — ED Notes (Signed)
Attempted IV x 2 with no success. 

## 2019-07-16 NOTE — ED Notes (Addendum)
Per Radiology, Ventilation component of  V/Q scan has been contaminated, and they feel that pt may not get full test. Recommend CT angio. Per previous RN/ And Mia PA, pt was stuck over 15 times IV u/s attempted. Pt does have a 22 G Rt  forearm, Hospitalist in room at the time and made her aware. Instructed to to keep trying. IV team consulted again.

## 2019-07-16 NOTE — ED Notes (Signed)
Pt taken off bedpan and provided with peri care and linen change.

## 2019-07-16 NOTE — ED Notes (Signed)
O@ Sat 88% put patient on 2 L [place] and O2 Sat 93%

## 2019-07-16 NOTE — ED Notes (Signed)
IV team at bedside 

## 2019-07-16 NOTE — ED Notes (Signed)
Pt on the bedpan attempting to provide urine specimen.

## 2019-07-17 ENCOUNTER — Observation Stay (HOSPITAL_BASED_OUTPATIENT_CLINIC_OR_DEPARTMENT_OTHER): Payer: Medicare Other

## 2019-07-17 DIAGNOSIS — Z20828 Contact with and (suspected) exposure to other viral communicable diseases: Secondary | ICD-10-CM | POA: Diagnosis present

## 2019-07-17 DIAGNOSIS — Z7984 Long term (current) use of oral hypoglycemic drugs: Secondary | ICD-10-CM | POA: Diagnosis not present

## 2019-07-17 DIAGNOSIS — G894 Chronic pain syndrome: Secondary | ICD-10-CM | POA: Diagnosis present

## 2019-07-17 DIAGNOSIS — E785 Hyperlipidemia, unspecified: Secondary | ICD-10-CM | POA: Diagnosis present

## 2019-07-17 DIAGNOSIS — K219 Gastro-esophageal reflux disease without esophagitis: Secondary | ICD-10-CM | POA: Diagnosis present

## 2019-07-17 DIAGNOSIS — R0602 Shortness of breath: Secondary | ICD-10-CM | POA: Diagnosis not present

## 2019-07-17 DIAGNOSIS — F329 Major depressive disorder, single episode, unspecified: Secondary | ICD-10-CM | POA: Diagnosis present

## 2019-07-17 DIAGNOSIS — Z79899 Other long term (current) drug therapy: Secondary | ICD-10-CM | POA: Diagnosis not present

## 2019-07-17 DIAGNOSIS — J9601 Acute respiratory failure with hypoxia: Secondary | ICD-10-CM | POA: Diagnosis not present

## 2019-07-17 DIAGNOSIS — Z7982 Long term (current) use of aspirin: Secondary | ICD-10-CM | POA: Diagnosis not present

## 2019-07-17 DIAGNOSIS — I69398 Other sequelae of cerebral infarction: Secondary | ICD-10-CM | POA: Diagnosis not present

## 2019-07-17 DIAGNOSIS — G20 Parkinson's disease: Secondary | ICD-10-CM | POA: Diagnosis present

## 2019-07-17 DIAGNOSIS — M1990 Unspecified osteoarthritis, unspecified site: Secondary | ICD-10-CM | POA: Diagnosis present

## 2019-07-17 DIAGNOSIS — Z888 Allergy status to other drugs, medicaments and biological substances status: Secondary | ICD-10-CM | POA: Diagnosis not present

## 2019-07-17 DIAGNOSIS — Z823 Family history of stroke: Secondary | ICD-10-CM | POA: Diagnosis not present

## 2019-07-17 DIAGNOSIS — F419 Anxiety disorder, unspecified: Secondary | ICD-10-CM | POA: Diagnosis present

## 2019-07-17 DIAGNOSIS — R269 Unspecified abnormalities of gait and mobility: Secondary | ICD-10-CM | POA: Diagnosis present

## 2019-07-17 DIAGNOSIS — G40909 Epilepsy, unspecified, not intractable, without status epilepticus: Secondary | ICD-10-CM | POA: Diagnosis present

## 2019-07-17 DIAGNOSIS — R531 Weakness: Secondary | ICD-10-CM | POA: Diagnosis present

## 2019-07-17 DIAGNOSIS — Z87891 Personal history of nicotine dependence: Secondary | ICD-10-CM | POA: Diagnosis not present

## 2019-07-17 DIAGNOSIS — J449 Chronic obstructive pulmonary disease, unspecified: Secondary | ICD-10-CM | POA: Diagnosis present

## 2019-07-17 DIAGNOSIS — Z8249 Family history of ischemic heart disease and other diseases of the circulatory system: Secondary | ICD-10-CM | POA: Diagnosis not present

## 2019-07-17 DIAGNOSIS — M810 Age-related osteoporosis without current pathological fracture: Secondary | ICD-10-CM | POA: Diagnosis present

## 2019-07-17 DIAGNOSIS — Z886 Allergy status to analgesic agent status: Secondary | ICD-10-CM | POA: Diagnosis not present

## 2019-07-17 DIAGNOSIS — E1142 Type 2 diabetes mellitus with diabetic polyneuropathy: Secondary | ICD-10-CM | POA: Diagnosis present

## 2019-07-17 DIAGNOSIS — R5381 Other malaise: Secondary | ICD-10-CM | POA: Diagnosis present

## 2019-07-17 LAB — CBC
HCT: 42.6 % (ref 36.0–46.0)
Hemoglobin: 12.6 g/dL (ref 12.0–15.0)
MCH: 27.4 pg (ref 26.0–34.0)
MCHC: 29.6 g/dL — ABNORMAL LOW (ref 30.0–36.0)
MCV: 92.6 fL (ref 80.0–100.0)
Platelets: 172 10*3/uL (ref 150–400)
RBC: 4.6 MIL/uL (ref 3.87–5.11)
RDW: 17.2 % — ABNORMAL HIGH (ref 11.5–15.5)
WBC: 5.5 10*3/uL (ref 4.0–10.5)
nRBC: 0 % (ref 0.0–0.2)

## 2019-07-17 LAB — BASIC METABOLIC PANEL
Anion gap: 11 (ref 5–15)
BUN: 13 mg/dL (ref 8–23)
CO2: 26 mmol/L (ref 22–32)
Calcium: 7.9 mg/dL — ABNORMAL LOW (ref 8.9–10.3)
Chloride: 99 mmol/L (ref 98–111)
Creatinine, Ser: 0.68 mg/dL (ref 0.44–1.00)
GFR calc Af Amer: >60 mL/min (ref >60)
GFR calc non Af Amer: >60 mL/min (ref >60)
Glucose, Bld: 189 mg/dL — ABNORMAL HIGH (ref 70–99)
Potassium: 4.6 mmol/L (ref 3.5–5.1)
Sodium: 136 mmol/L (ref 135–145)

## 2019-07-17 LAB — HEMOGLOBIN A1C
Hgb A1c MFr Bld: 7.1 % — ABNORMAL HIGH (ref 4.8–5.6)
Mean Plasma Glucose: 157.07 mg/dL

## 2019-07-17 LAB — ECHOCARDIOGRAM COMPLETE
Height: 65 in
Weight: 2640 oz

## 2019-07-17 LAB — HIV ANTIBODY (ROUTINE TESTING W REFLEX): HIV Screen 4th Generation wRfx: NONREACTIVE

## 2019-07-17 LAB — GLUCOSE, CAPILLARY
Glucose-Capillary: 177 mg/dL — ABNORMAL HIGH (ref 70–99)
Glucose-Capillary: 97 mg/dL (ref 70–99)
Glucose-Capillary: 151 mg/dL — ABNORMAL HIGH (ref 70–99)
Glucose-Capillary: 133 mg/dL — ABNORMAL HIGH (ref 70–99)

## 2019-07-17 MED ORDER — GUAIFENESIN 100 MG/5ML PO SOLN: 5.0000 mL | ORAL | Status: DC | PRN

## 2019-07-17 MED ORDER — PROMETHAZINE HCL 25 MG/ML IJ SOLN
12.5000 mg | Freq: Once | INTRAMUSCULAR | Status: AC
  Administered 2019-07-17: 12.5 mg via INTRAVENOUS
  Filled 2019-07-17: qty 1

## 2019-07-17 NOTE — Evaluation (Signed)
Physical Therapy Evaluation Patient Details Name: [name] MRN: [id number] DOB: 03/23/1951 Today's Date: 07/17/2019   History of Present Illness  68 y.o. female with medical history significant for Parkinson's disease, diabetes mellitus, COPD, seizures, anoxic brain injury, CVA with residual left sided weakness and admitted for acute respiratory failure with hypoxia  Clinical Impression  Pt admitted with above diagnosis.  Pt currently with functional limitations due to the deficits listed below (see PT Problem List). Pt will benefit from skilled PT to increase their independence and safety with mobility to allow discharge to the venue listed below.  Pt requiring increased assist for transfers at this time and unable to ambulate today.  Pt reports she can typically ambulate short distance at home with RW.  Pt may benefit from SNF if physical assist not available at home. SPO2 93% room air at rest and 94% on room air after transfer.     Follow Up Recommendations SNF;Supervision/Assistance - 24 hour    Equipment Recommendations  None recommended by PT    Recommendations for Other Services       Precautions / Restrictions Precautions Precautions: Fall Restrictions Weight Bearing Restrictions: No      Mobility  Bed Mobility Overal bed mobility: Needs Assistance Bed Mobility: Supine to Sit     Supine to sit: Min assist;HOB elevated     General bed mobility comments: slight assist for trunk upright  Transfers Overall transfer level: Needs assistance Equipment used: Rolling walker (2 wheeled) Transfers: Sit to/from [employer] Sit to Stand: Min assist Stand pivot transfers: Mod assist       General transfer comment: verbal cues for safe technique, pt fatigued after partial turn and cues for completing transfer however pt required safe descent into recliner; difficulty advancing/moving L LE during transfer  Ambulation/Gait                Stairs            Wheelchair Mobility    Modified Rankin (Stroke Patients Only)       Balance Overall balance assessment: History of Falls                                           Pertinent Vitals/Pain Pain Assessment: No/denies pain    Home Living Family/patient expects to be discharged to:: Private residence Living Arrangements: Spouse/significant other   Type of Home: House Home Access: Ramped entrance     Home Layout: One level Home Equipment: Environmental consultant - 2 wheels;Walker - 4 wheels;Bedside commode      Prior Function Level of Independence: Needs assistance   Gait / Transfers Assistance Needed: typically ambulates short distances with RW, uses w/c for community  ADL's / Homemaking Assistance Needed: reports assist for ADLs  Comments: pt reports multiple falls     Hand Dominance        Extremity/Trunk Assessment        Lower Extremity Assessment Lower Extremity Assessment: Generalized weakness;LLE deficits/detail LLE Deficits / Details: appears L LE functionally weaker then R LE; hx of left sided weakness deficits after CVA       Communication   Communication: No difficulties  Cognition Arousal/Alertness: Awake/alert Behavior During Therapy: WFL for tasks assessed/performed Overall Cognitive Status: Within Functional Limits for tasks assessed  General Comments      Exercises     Assessment/Plan    PT Assessment Patient needs continued PT services  PT Problem List Decreased strength;Decreased mobility;Decreased activity tolerance;Decreased balance;Decreased knowledge of use of DME;Decreased coordination       PT Treatment Interventions DME instruction;Balance training;Gait training;Therapeutic exercise;Stair training;Functional mobility training;Therapeutic activities;Patient/family education    PT Goals (Current goals can be found in the Care Plan section)  Acute Rehab PT  Goals PT Goal Formulation: With patient Time For Goal Achievement: 07/31/19 Potential to Achieve Goals: Fair    Frequency Min 3X/week   Barriers to discharge        Co-evaluation               AM-PAC PT "6 Clicks" Mobility  Outcome Measure Help needed turning from your back to your side while in a flat bed without using bedrails?: A Little Help needed moving from lying on your back to sitting on the side of a flat bed without using bedrails?: A Lot Help needed moving to and from a bed to a chair (including a wheelchair)?: A Lot Help needed standing up from a chair using your arms (e.g., wheelchair or bedside chair)?: A Lot Help needed to walk in hospital room?: Total Help needed climbing 3-5 steps with a railing? : Total 6 Click Score: 11    End of Session Equipment Utilized During Treatment: Gait belt Activity Tolerance: Patient limited by fatigue Patient left: in chair;with call bell/phone within reach;with chair alarm set Nurse Communication: Mobility status PT Visit Diagnosis: Other abnormalities of gait and mobility (R26.89)    Time: 8676-7209 PT Time Calculation (min) (ACUTE ONLY): 20 min   Charges:   PT Evaluation $PT Eval Low Complexity: Yancey, PT, DPT Acute Rehabilitation Services Office: [phone] Pager: [phone]  Trena Platt 07/17/2019, 1:45 PM

## 2019-07-17 NOTE — Consult Note (Addendum)
[facility] Nurse wound consult note Reason for Consult: Consult requested for left heel.  Pt states she "previously had a wound to this site which has healed at this time." Wound type: Skin intact; small area of dry yellow callous, approx .2X.2cm; no open wound, drainage, or fluctuance to this location.  No need for topical treatment. Please re-consult if further assistance is needed.  Thank-you,  Julien Girt MSN, [place], [place], [place], [place]

## 2019-07-17 NOTE — Progress Notes (Signed)
Echocardiogram 2D Echocardiogram has been performed.  Oneal Deputy Abbas Beyene 07/17/2019, 9:25 AM

## 2019-07-17 NOTE — Progress Notes (Signed)
OT Cancellation Note  Patient Details Name: [name] MRN: [id number] DOB: [birth year]   Cancelled Treatment:    Reason Eval/Treat Not Completed: Fatigue/lethargy limiting ability to participate;Other (comment) When OT presents for evaluation, pt getting back to bed with CNA after having sat up in chair and requesting to rest at this time. Will f/u as able to complete evaluation.   Jake Church Dayne Dekay 07/17/2019, 11:27 AM

## 2019-07-17 NOTE — Progress Notes (Signed)
PROGRESS NOTE    [name]  ZOX:[id number] DOB: 1951/03/13 DOA: 07/16/2019 PCP: Tresa GarterPlotnikov, Aleksei V, MD    Brief Narrative:  68 year old with history of Parkinson's disease, diabetes, COPD, seizure disorder, anoxic brain injury presented to the emergency room with progressive generalized weakness and shortness of breath.  Patient was mainly complaining of left foot and leg pain.  Reportedly, she was hypoxic in the emergency room, 88%.  Labs were normal.  Urine normal.  D-dimer normal.  COVID-19 negative.  Chest x-ray normal.  Given low oxygen saturations, started on antibiotics and admitted.   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  Resolved.  May have chronic hypoxia related to her COPD.  Currently no evidence of exacerbation.  On bronchodilators as needed.  On room air now. She was started on Rocephin and azithromycin for reported cough and hypoxia.  Has no evidence of pneumonia.  Will discontinue antibiotics.  Profound physical deconditioning: In the setting of underlying Parkinson's disease.  No focal deficits.  Patient will need aggressive PT OT.  Continue to work with therapies.  Type 2 diabetes: With diabetic neuropathy: On metformin at home.  Currently remains on sliding scale insulin.  On multiple pain medications including gabapentin that she will continue.  Parkinson's disease: On Sinemet that she will continue.  Left heel ulcer: Healed.  Supportive care.   DVT prophylaxis: Lovenox subcu Code Status: Full code Family Communication: None Disposition Plan: To be determined.  Recommended SNF, however she would like to go home with home therapies.   Consultants:   None   Procedures:   None  Antimicrobials:   Rocephin, 07/16/2019---  Azithromycin, 07/16/2019--- 07/17/2019.   Subjective: Patient seen and examined.  Repeatedly examined for discharge recommendation.  No overnight events.  Denies any cough or sputum production.  Afebrile.  Her only complaint is she is profoundly weak.  Poor historian.  Objective: Vitals:   07/16/19 2125 07/17/19 0408 07/17/19 1006 07/17/19 1300  BP: 102/86 137/68 (!) 141/69 129/67  Pulse: 92 71 67 70  Resp: 20 18  16   Temp: 99.5 F (37.5 C) 98.2 F (36.8 C) 98 F (36.7 C) 98.6 F (37 C)  TempSrc: Oral Oral Oral Oral  SpO2: 93% 96%  92%  Weight:      Height:        Intake/Output Summary (Last 24 hours) at 07/17/2019 1619 Last data filed at 07/17/2019 1019 Gross per 24 hour  Intake -  Output 300 ml  Net -300 ml   Filed Weights   07/16/19 0127  Weight: 74.8 kg    Examination:  General exam: Appears calm and comfortable  Respiratory system: Clear to auscultation. Respiratory effort normal.  No added sounds.  On room air. Cardiovascular system: S1 & S2 heard, RRR. No JVD, murmurs, rubs, gallops or clicks. No pedal edema. Gastrointestinal system: Abdomen is nondistended, soft and nontender. No organomegaly or masses felt. Normal bowel sounds heard. Central nervous system: Alert and oriented. No focal neurological deficits. Extremities: Symmetric 5 x 5 power.  Generalized weakness. Skin: No rashes, lesions or ulcers Psychiatry: Judgement and insight appear normal. Mood & affect appropriate.     Data Reviewed: I have personally reviewed following labs and imaging  studies  CBC: Recent Labs  Lab 07/16/19 0410 07/17/19 0513  WBC 4.7 5.5  HGB 12.8 12.6  HCT 42.0 42.6  MCV 89.6 92.6  PLT 217 371   Basic Metabolic Panel: Recent Labs  Lab 07/16/19 0410 07/17/19 0513  NA 138 136  K 4.3 4.6  CL 98 99  CO2 28 26  GLUCOSE 166* 189*  BUN 7* 13  CREATININE 0.48 0.68  CALCIUM 8.4* 7.9*   GFR:  Estimated Creatinine Clearance: 68.1 mL/min (by C-G formula based on SCr of 0.68 mg/dL). Liver Function Tests: Recent Labs  Lab 07/16/19 0410  AST 18  ALT 13  ALKPHOS 64  BILITOT 0.2*  PROT 6.8  ALBUMIN 3.7   No results for input(s): LIPASE, AMYLASE in the last 168 hours. No results for input(s): AMMONIA in the last 168 hours. Coagulation Profile: No results for input(s): INR, PROTIME in the last 168 hours. Cardiac Enzymes: No results for input(s): CKTOTAL, CKMB, CKMBINDEX, TROPONINI in the last 168 hours. BNP (last 3 results) No results for input(s): PROBNP in the last 8760 hours. HbA1C: Recent Labs    07/17/19 0513  HGBA1C 7.1*   CBG: Recent Labs  Lab 07/16/19 1148 07/16/19 1713 07/16/19 2126 07/17/19 0725 07/17/19 1103  GLUCAP 148* 163* 178* 177* 97   Lipid Profile: No results for input(s): CHOL, HDL, LDLCALC, TRIG, CHOLHDL, LDLDIRECT in the last 72 hours. Thyroid Function Tests: No results for input(s): TSH, T4TOTAL, FREET4, T3FREE, THYROIDAB in the last 72 hours. Anemia Panel: No results for input(s): VITAMINB12, FOLATE, FERRITIN, TIBC, IRON, RETICCTPCT in the last 72 hours. Sepsis Labs: No results for input(s): PROCALCITON, LATICACIDVEN in the last 168 hours.  Recent Results (from the past 240 hour(s))  SARS Coronavirus 2 by RT PCR (hospital order, performed in [facility] hospital lab) Nasopharyngeal Nasopharyngeal Swab     Status: None   Collection Time: 07/16/19  4:44 AM   Specimen: Nasopharyngeal Swab  Result Value Ref Range Status   SARS Coronavirus 2 NEGATIVE NEGATIVE Final    Comment: (NOTE) If result is NEGATIVE SARS-CoV-2 target nucleic acids are NOT DETECTED. The SARS-CoV-2 RNA is generally detectable in upper and lower  respiratory specimens during the acute phase of infection. The lowest  concentration of SARS-CoV-2 viral copies this assay can detect is 250  copies / mL. A negative result does not preclude SARS-CoV-2 infection  and should  not be used as the sole basis for treatment or other  patient management decisions.  A negative result may occur with  improper specimen collection / handling, submission of specimen other  than nasopharyngeal swab, presence of viral mutation(s) within the  areas targeted by this assay, and inadequate number of viral copies  (<250 copies / mL). A negative result must be combined with clinical  observations, patient history, and epidemiological information. If result is POSITIVE SARS-CoV-2 target nucleic acids are DETECTED. The SARS-CoV-2 RNA is generally detectable in upper and lower  respiratory specimens dur ing the acute phase of infection.  Positive  results are indicative of active infection with SARS-CoV-2.  Clinical  correlation with patient history and other diagnostic information is  necessary to determine patient infection status.  Positive results do  not rule out bacterial infection or co-infection with other viruses. If result is PRESUMPTIVE POSTIVE SARS-CoV-2 nucleic acids MAY BE PRESENT.   A presumptive positive result was obtained on the submitted specimen  and confirmed on repeat testing.  While 2019 novel coronavirus  (SARS-CoV-2) nucleic acids may be  present in the submitted sample  additional confirmatory testing may be necessary for epidemiological  and / or clinical management purposes  to differentiate between  SARS-CoV-2 and other Sarbecovirus currently known to infect humans.  If clinically indicated additional testing with an alternate test  methodology [id number]) is advised. The SARS-CoV-2 RNA is generally  detectable in upper and lower respiratory sp ecimens during the acute  phase of infection. The expected result is Negative. Fact Sheet for Patients:  BoilerBrush.com.cy Fact Sheet for Healthcare Providers: https://pope.com/ This test is not yet approved or cleared by the [place] FDA and has been  authorized for detection and/or diagnosis of SARS-CoV-2 by FDA under an Emergency Use Authorization (EUA).  This EUA will remain in effect (meaning this test can be used) for the duration of the COVID-19 declaration under Section 564(b)(1) of the Act, 21 U.S.C. section 360bbb-3(b)(1), unless the authorization is terminated or revoked sooner. Performed at [facility], [address] [place], [place] [address]          Radiology Studies: Dg Chest 2 View  Result Date: 07/16/2019 CLINICAL DATA:  Shortness of breath EXAM: CHEST - 2 VIEW COMPARISON:  08/19/2018 FINDINGS: Normal heart size and mediastinal contours. No acute infiltrate or edema. No effusion or pneumothorax. No acute osseous findings. Remote left humerus fracture repair. Postoperative left upper quadrant. IMPRESSION: No evidence of active disease Electronically Signed   By: Marnee Spring M.D.   On: 07/16/2019 04:06        Scheduled Meds: . aspirin EC  81 mg Oral Daily  . carbidopa-levodopa  1 tablet Oral TID  . enoxaparin (LOVENOX) injection  40 mg Subcutaneous QHS  . gabapentin  200 mg Oral TID  . insulin aspart  0-9 Units Subcutaneous TID WC  . metoprolol tartrate  12.5 mg Oral BID  . mirtazapine  15 mg Oral QHS  . multivitamin  1 tablet Oral Daily  . tamsulosin  0.4 mg Oral Daily   Continuous Infusions: . cefTRIAXone (ROCEPHIN)  IV 1 g (07/17/19 0801)     LOS: 0 days    Time spent: 25 minutes    Dorcas Carrow, MD [facility] Pager [phone]

## 2019-07-18 LAB — GLUCOSE, CAPILLARY
Glucose-Capillary: 131 mg/dL — ABNORMAL HIGH (ref 70–99)
Glucose-Capillary: 176 mg/dL — ABNORMAL HIGH (ref 70–99)

## 2019-07-18 NOTE — Progress Notes (Signed)
    Home health agencies that serve [id number].        Home Health Agencies Search Results  Results List Table  Home Health Agency Information Quality of Patient Care Rating Patient Survey Summary Rating  [facility] [phone] 3 out of 5 stars 5 out of 5 stars  [facility] [phone] 3 out of 5 stars 4 out of 5 stars  [facility] [phone] 4  out of 5 stars 3 out of 5 stars  [facility] [phone] 4 out of 5 stars 4 out of 5 stars  [facility] [phone] 4 out of 5 stars 4 out of 5 stars  [facility] [phone] 3  out of 5 stars 4 out of 5 stars  [facility] [phone] 3 out of 5 stars 4 out of 5 stars  [facility] [phone] 4 out of 5 stars Not Available12  [facility] [phone] 3  out of 5 stars 3 out of 5 stars  [facility] [phone] 3  out of 5 stars 3 out of 5 stars  [facility] [phone] 4  out of 5 stars 3 out of 5 stars   Home Health Footnotes  Footnote number Footnote as displayed on Home Health Compare  1 This agency provides services under a federal waiver program to non-traditional, chronic long term population.  2 This agency provides services to a special needs population.  3 Not Available.  4 The number of patient episodes for this measure is too small to report.  5 This measure currently does not have data or provider has been certified/recertified for less than 6 months.  6 The national average for this measure is not provided because of state-to-state differences in data collection.  7 Medicare is not displaying rates for this measure for any home health agency, because of an issue with the data.  8 There were problems with the data and they are being corrected.  9 Zero, or very few, patients met the survey's rules for inclusion. The scores shown, if any, reflect a  very small number of surveys and may not accurately tell how an agency is doing.  10 Survey results are based on less than 12 months of data.  11 Fewer than 70 patients completed the survey. Use the scores shown, if any, with caution as the number of surveys may be too low to accurately tell how an agency is doing.  12 No survey results are available for this period.  13 Data suppressed by CMS for one or more quarters.    

## 2019-07-18 NOTE — TOC Progression Note (Signed)
Transition of Care [facility]) - Progression Note    Patient Details  Name: [name] MRN: [id number] Date of Birth: 1950/12/26  Transition of Care [facility]) CM/SW Contact  Joaquin Courts, RN Phone Number: 07/18/2019, 10:07 AM  Clinical Narrative:    CM spoke with patient. [facility] HH[facility] and OT.    Expected Discharge Plan: [place] Barriers to Discharge: No Barriers Identified  Expected Discharge Plan and Services Expected Discharge Plan: [place]   Discharge Planning Services: CM Consult Post Acute Care Choice: [place] arrangements for the past 2 months: Single Family Home Expected Discharge Date: 07/18/19               DME Arranged: N/A DME Agency: NA       HH Arranged: OT, PT [place] Agency: [place] ([place]) Date [place]: 07/18/19 Time [place]: 1005 Representative spoke with at [place]: [place] ([place]) Interventions    Readmission Risk Interventions No flowsheet data found.

## 2019-07-18 NOTE — Evaluation (Signed)
Occupational Therapy Evaluation Patient Details Name: [name] MRN: [id number] DOB: 1951/05/29 Today's Date: 07/18/2019    History of Present Illness 68 y.o. female with medical history significant for Parkinson's disease, diabetes mellitus, COPD, seizures, anoxic brain injury, CVA with residual left sided weakness and admitted for acute respiratory failure with hypoxia   Clinical Impression   Pt admitted with ARF/hypoxia. Pt currently with functional limitations due to the deficits listed below (see OT Problem List).  Pt will benefit from skilled OT to increase their safety and independence with ADL and functional mobility for ADL to facilitate discharge to venue listed below.   Pt will need significant A with ADL activity upon DC.  Pt total A with hygiene after BM and stated husband did not usually A her with this.  Rn aware     Follow Up Recommendations  Home health OT;Supervision/Assistance - 24 hour;SNF(pt prefers home.  Unsure husband is aware how much A she will need. RN aware)    Equipment Recommendations  None recommended by OT    Recommendations for Other Services       Precautions / Restrictions Precautions Precautions: Fall Restrictions Weight Bearing Restrictions: No      Mobility Bed Mobility   Bed Mobility: Sit to Supine       Sit to supine: Mod assist      Transfers Overall transfer level: Needs assistance Equipment used: Rolling walker (2 wheeled) Transfers: Sit to/from [employer] Sit to Stand: Max assist Stand pivot transfers: Max assist       General transfer comment: Pt with significant difficulty advancing LLE during transfer.  Pt needed VC for safety    Balance Overall balance assessment: History of Falls                                         ADL either performed or assessed with clinical judgement   ADL Overall ADL's : Needs assistance/impaired     Grooming: Set up;Sitting   Upper Body  Bathing: Set up;Cueing for compensatory techniques   Lower Body Bathing: Maximal assistance;Sit to/from stand;Cueing for compensatory techniques;Cueing for safety;Cueing for sequencing   Upper Body Dressing : Minimal assistance;Sitting   Lower Body Dressing: Maximal assistance;Sit to/from stand;Cueing for compensatory techniques;Cueing for sequencing;Cueing for safety   Toilet Transfer: Maximal assistance;Stand-pivot;Cueing for safety;RW;BSC   Toileting- Clothing Manipulation and Hygiene: Total assistance;Sit to/from stand         General ADL Comments: Pt will need significant A with ADL activity upon DC including toileting.  Reccomend pt only perform transfers with husband and to let [facility] therapy guide progression.  Pt very unsteady during transfer.  Reccomend bed to WC to 3 n 1, etc                  Pertinent Vitals/Pain Pain Assessment: No/denies pain     Hand Dominance     Extremity/Trunk Assessment Upper Extremity Assessment Upper Extremity Assessment: Generalized weakness           Communication Communication Communication: No difficulties   Cognition Arousal/Alertness: Awake/alert Behavior During Therapy: WFL for tasks assessed/performed Overall Cognitive Status: Within Functional Limits for tasks assessed  Home Living Family/patient expects to be discharged to:: Private residence Living Arrangements: Spouse/significant other Available Help at Discharge: Family;Personal care attendant Type of Home: House Home Access: [place]: One level     Bathroom Shower/Tub: [place]: Environmental consultant - 2 wheels;Walker - 4 wheels;Bedside commode          Prior Functioning/Environment Level of Independence: Needs assistance  Gait / Transfers Assistance Needed: typically ambulates short distances with RW, uses w/c for community ADL's / Homemaking  Assistance Needed: reports assist for ADLs   Comments: pt reports multiple falls        OT Problem List: Decreased strength;Decreased activity tolerance;Impaired balance (sitting and/or standing);Decreased safety awareness;Decreased knowledge of use of DME or AE      OT Treatment/Interventions: Self-care/ADL training;Patient/family education;DME and/or AE instruction    OT Goals(Current goals can be found in the care plan section) Acute Rehab OT Goals Patient Stated Goal: home with husband OT Goal Formulation: With patient Time For Goal Achievement: 07/25/19 Potential to Achieve Goals: Good  OT Frequency: Min 2X/week   Barriers to D/C:               AM-PAC OT "6 Clicks" Daily Activity     Outcome Measure Help from another person eating meals?: None Help from another person taking care of personal grooming?: A Little Help from another person toileting, which includes using toliet, bedpan, or urinal?: Total Help from another person bathing (including washing, rinsing, drying)?: A Lot Help from another person to put on and taking off regular upper body clothing?: A Little Help from another person to put on and taking off regular lower body clothing?: Total 6 Click Score: 14   End of Session Equipment Utilized During Treatment: Gait belt;Rolling walker Nurse Communication: Mobility status  Activity Tolerance: Patient tolerated treatment well Patient left: in bed;with call bell/phone within reach  OT Visit Diagnosis: Other abnormalities of gait and mobility (R26.89);Repeated falls (R29.6);Muscle weakness (generalized) (M62.81);History of falling (Z91.81)                Time: 7371-0626 OT Time Calculation (min): 23 min Charges:  OT Evaluation $OT Eval Low Complexity: 1 Low $OT Eval High Complexity: 1 High OT Treatments $Self Care/Home Management : 8-22 mins  Kari Baars, OT Acute Rehabilitation Services [phone] Office- [phone],  Edwena Felty D 07/18/2019, 12:55 PM

## 2019-07-18 NOTE — Discharge Summary (Signed)
Physician Discharge Summary  [name] WUJ:[id number] DOB: 05/28/1951 DOA: 07/16/2019  PCP: Tresa GarterPlotnikov, Aleksei V, MD  Admit date: 07/16/2019 Discharge date: 07/18/2019  Admitted From: Home Disposition: Home  Recommendations for Outpatient Follow-up:  1. Follow up with PCP in 1-2 weeks 2.   Home Health: PT OT Equipment/Devices: None  Discharge Condition: Stable CODE STATUS: Full code Diet recommendation: Low-carb diet  Discharge summary: Patient has Parkinson's disease, diabetes on oral hypoglycemics, COPD, seizure disorder and history of anoxic brain injury presented to the emergency room with progressive generalized weakness and shortness of breath.  Patient was recently treated for left foot and leg pain, left foot ulcer.  In the emergency room, she initially she was noted to be hypoxic 88% on room air, however most of the investigations were normal.  Negative infection work-up.  D-dimer normal.  COVID-19 negative.  Chest x-ray normal.  Saturating well on room air and on ambulation in the hospital.  Patient was fairly stable.  She was found with profound physical deconditioning in the setting of underlying Parkinson's disease and multiple medical problems.  Was seen by PT OT.  Recommended inpatient therapies at a skilled nursing facility, however patient and family agreed to go home with adequate support at home and do home PT OT.  Discharged home.  No change in her long-term medications were done.  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) (HCC)   Gait disorder   Physical deconditioning    Discharge Instructions  Discharge Instructions    Diet Carb Modified   Complete by: As directed    Increase activity slowly   Complete by: As directed      Allergies  as of 07/18/2019      Reactions   Asa [aspirin] Other (See Comments)   "stomach burning", "I can take baby aspirin"   Prednisone Other (See Comments)   MD told it gavepancreatitis  and almost died      Medication List    STOP taking these medications   b complex vitamins tablet   benzonatate 200 MG capsule Commonly known as: TESSALON   calcium carbonate 1500 (600 Ca) MG Tabs tablet Commonly known as: OSCAL   cefdinir 300 MG capsule Commonly known as: OMNICEF   cholecalciferol 1000 units tablet Commonly known as: VITAMIN D   diclofenac sodium 1 % Gel Commonly known as: VOLTAREN   doxycycline 100 MG tablet Commonly known as: VIBRA-TABS   Fluticasone-Umeclidin-Vilant 100-62.5-25 MCG/INH Aepb Commonly known as: Trelegy Ellipta   mupirocin ointment 2 % Commonly known as: BACTROBAN   nitrofurantoin 100 MG capsule Commonly known as: MACRODANTIN   promethazine 12.5 MG tablet Commonly known as: PHENERGAN   Vitamin D (Ergocalciferol) 1.25 MG (50000 UT) Caps capsule Commonly known as: DRISDOL   zolpidem 10 MG tablet Commonly known as: AMBIEN     TAKE these medications   albuterol 108 (90 Base) MCG/ACT inhaler Commonly known as: VENTOLIN HFA Inhale 1-2 puffs into the lungs every 6 (six) hours as needed for wheezing or shortness of breath.   aspirin 81 MG EC tablet Take 81 mg by mouth daily.   carbidopa-levodopa 25-250 MG tablet Commonly known as: SINEMET IR TAKE 1 TABLET BY MOUTH 3 (THREE) TIMES DAILY. PATIENT NEEDS OFFICE VISIT BEFORE REFILLS WILL BE GIVEN What changed: additional instructions  gabapentin 100 MG capsule Commonly known as: NEURONTIN Take 200 mg by mouth 3 (three) times daily.   glucose blood test strip Commonly known as: OneTouch Verio Use to check blood sugars twice a day. Dx E11.9   HYDROmorphone 2 MG tablet Commonly known as: DILAUDID Take 2-3 tablets (4-6 mg total) by mouth every 6 (six) hours as needed for moderate pain or severe  pain. What changed:   how much to take  when to take this   LORazepam 2 MG tablet Commonly known as: ATIVAN TAKE 1 TABLET BY MOUTH THREE TIMES A DAY   metFORMIN 750 MG 24 hr tablet Commonly known as: GLUCOPHAGE-XR Take 750 mg by mouth 2 (two) times daily. What changed: Another medication with the same name was removed. Continue taking this medication, and follow the directions you see here.   metoprolol tartrate 25 MG tablet Commonly known as: LOPRESSOR TAKE ONE-HALF TABLET (12.5 MG TOTAL) BY MOUTH 2 (TWO) TIMES DAILY What changed: See the new instructions.   mirtazapine 15 MG tablet Commonly known as: REMERON TAKE 1 TABLET BY MOUTH AT BEDTIME AT 6-8 PM What changed: See the new instructions.   multivitamin-lutein Caps capsule Take 1 capsule by mouth daily.   OneTouch Delica Lancets 33G Misc Use to help check blood sugars twice a day. Dx E11.9   repaglinide 2 MG tablet Commonly known as: PRANDIN TAKE 1 TABLET (2 MG TOTAL) BY MOUTH 3 (THREE) TIMES DAILY BEFORE MEALS.   tamsulosin 0.4 MG Caps capsule Commonly known as: FLOMAX TAKE 1 CAPSULE (0.4 MG TOTAL) DAILY BY MOUTH.       Allergies  Allergen Reactions  . Asa [Aspirin] Other (See Comments)    "stomach burning", "I can take baby aspirin"  . Prednisone Other (See Comments)    MD told it gavepancreatitis  and almost died    Consultations:  PT OT   Procedures/Studies: Dg Chest 2 View  Result Date: 07/16/2019 CLINICAL DATA:  Shortness of breath EXAM: CHEST - 2 VIEW COMPARISON:  08/19/2018 FINDINGS: Normal heart size and mediastinal contours. No acute infiltrate or edema. No effusion or pneumothorax. No acute osseous findings. Remote left humerus fracture repair. Postoperative left upper quadrant. IMPRESSION: No evidence of active disease Electronically Signed   By: Marnee Spring M.D.   On: 07/16/2019 04:06     Subjective: Patient seen and examined.  No overnight events.  She did not sleep well despite  getting her dose of Ativan in the night.  Patient states she would be better off at home and doing therapies at home.  Remains on room air.  Was 93% after ambulating with PT, however she had difficult time ambulating independently.  Patient stated she has enough support and care at home.   Discharge Exam: Vitals:   07/18/19 0600 07/18/19 0842  BP:  137/74  Pulse:  90  Resp:  18  Temp:  98.7 F (37.1 C)  SpO2: 93% 95%   Vitals:   07/17/19 2018 07/18/19 0511 07/18/19 0600 07/18/19 0842  BP: (!) 147/78 109/86  137/74  Pulse: 79 78  90  Resp: Temp: 99.3 F (37.4 C) 97.7 F (36.5 C)  98.7 F (37.1 C)  TempSrc: Oral   Oral  SpO2: 93% (!) 89% 93% 95%  Weight:      Height:        General: Pt is alert, awake, not in acute distress Cardiovascular: RRR, S1/S2 +, no rubs, no gallops Respiratory: CTA bilaterally, no  wheezing, no rhonchi Abdominal: Soft, NT, ND, bowel sounds + Extremities: no edema, no cyanosis She does not have any focal neurological deficit.    The results of significant diagnostics from this hospitalization (including imaging, microbiology, ancillary and laboratory) are listed below for reference.     Microbiology: Recent Results (from the past 240 hour(s))  SARS Coronavirus 2 by RT PCR (hospital order, performed in [facility] hospital lab) Nasopharyngeal Nasopharyngeal Swab     Status: None   Collection Time: 07/16/19  4:44 AM   Specimen: Nasopharyngeal Swab  Result Value Ref Range Status   SARS Coronavirus 2 NEGATIVE NEGATIVE Final    Comment: (NOTE) If result is NEGATIVE SARS-CoV-2 target nucleic acids are NOT DETECTED. The SARS-CoV-2 RNA is generally detectable in upper and lower  respiratory specimens during the acute phase of infection. The lowest  concentration of SARS-CoV-2 viral copies this assay can detect is 250  copies / mL. A negative result does not preclude SARS-CoV-2 infection  and should not be used as the sole basis for  treatment or other  patient management decisions.  A negative result may occur with  improper specimen collection / handling, submission of specimen other  than nasopharyngeal swab, presence of viral mutation(s) within the  areas targeted by this assay, and inadequate number of viral copies  (<250 copies / mL). A negative result must be combined with clinical  observations, patient history, and epidemiological information. If result is POSITIVE SARS-CoV-2 target nucleic acids are DETECTED. The SARS-CoV-2 RNA is generally detectable in upper and lower  respiratory specimens dur ing the acute phase of infection.  Positive  results are indicative of active infection with SARS-CoV-2.  Clinical  correlation with patient history and other diagnostic information is  necessary to determine patient infection status.  Positive results do  not rule out bacterial infection or co-infection with other viruses. If result is PRESUMPTIVE POSTIVE SARS-CoV-2 nucleic acids MAY BE PRESENT.   A presumptive positive result was obtained on the submitted specimen  and confirmed on repeat testing.  While 2019 novel coronavirus  (SARS-CoV-2) nucleic acids may be present in the submitted sample  additional confirmatory testing may be necessary for epidemiological  and / or clinical management purposes  to differentiate between  SARS-CoV-2 and other Sarbecovirus currently known to infect humans.  If clinically indicated additional testing with an alternate test  methodology [phone]) is advised. The SARS-CoV-2 RNA is generally  detectable in upper and lower respiratory sp ecimens during the acute  phase of infection. The expected result is Negative. Fact Sheet for Patients:  BoilerBrush.com.cy Fact Sheet for Healthcare Providers: https://pope.com/ This test is not yet approved or cleared by the [place] FDA and has been authorized for detection and/or  diagnosis of SARS-CoV-2 by FDA under an Emergency Use Authorization (EUA).  This EUA will remain in effect (meaning this test can be used) for the duration of the COVID-19 declaration under Section 564(b)(1) of the Act, 21 U.S.C. section 360bbb-3(b)(1), unless the authorization is terminated or revoked sooner. Performed at [facility], [address] [place], [place] [address]      Labs: BNP (last 3 results) Recent Labs    07/16/19 2218  BNP 59.3   Basic Metabolic Panel: Recent Labs  Lab 07/16/19 0410 07/17/19 0513  NA 138 136  K 4.3 4.6  CL 98 99  CO2 28 26  GLUCOSE 166* 189*  BUN 7* 13  CREATININE 0.48 0.68  CALCIUM 8.4* 7.9*  Liver Function Tests: Recent Labs  Lab 07/16/19 0410  AST 18  ALT 13  ALKPHOS 64  BILITOT 0.2*  PROT 6.8  ALBUMIN 3.7   No results for input(s): LIPASE, AMYLASE in the last 168 hours. No results for input(s): AMMONIA in the last 168 hours. CBC: Recent Labs  Lab 07/16/19 0410 07/17/19 0513  WBC 4.7 5.5  HGB 12.8 12.6  HCT 42.0 42.6  MCV 89.6 92.6  PLT 217 172   Cardiac Enzymes: No results for input(s): CKTOTAL, CKMB, CKMBINDEX, TROPONINI in the last 168 hours. BNP: Invalid input(s): POCBNP CBG: Recent Labs  Lab 07/17/19 0725 07/17/19 1103 07/17/19 1626 07/17/19 2112 07/18/19 0721  GLUCAP 177* 97 151* 133* 131*   D-Dimer Recent Labs    07/16/19 0949  DDIMER 0.37   Hgb A1c Recent Labs    07/17/19 0513  HGBA1C 7.1*   Lipid Profile No results for input(s): CHOL, HDL, LDLCALC, TRIG, CHOLHDL, LDLDIRECT in the last 72 hours. Thyroid function studies No results for input(s): TSH, T4TOTAL, T3FREE, THYROIDAB in the last 72 hours.  Invalid input(s): FREET3 Anemia work up No results for input(s): VITAMINB12, FOLATE, FERRITIN, TIBC, IRON, RETICCTPCT in the last 72 hours. Urinalysis    Component Value Date/Time   COLORURINE STRAW (A) 07/16/2019 0313   APPEARANCEUR CLEAR 07/16/2019 0313    LABSPEC 1.006 07/16/2019 0313   PHURINE 6.0 07/16/2019 0313   GLUCOSEU 50 (A) 07/16/2019 0313   GLUCOSEU NEGATIVE 12/17/2017 1022   HGBUR NEGATIVE 07/16/2019 0313   BILIRUBINUR NEGATIVE 07/16/2019 0313   BILIRUBINUR Neg 11/06/2016 1159   [place] 07/16/2019 0313   PROTEINUR NEGATIVE 07/16/2019 0313   UROBILINOGEN 0.2 12/17/2017 1022   NITRITE NEGATIVE 07/16/2019 0313   LEUKOCYTESUR TRACE (A) 07/16/2019 0313   Sepsis Labs Invalid input(s): PROCALCITONIN,  WBC,  LACTICIDVEN Microbiology Recent Results (from the past 240 hour(s))  SARS Coronavirus 2 by RT PCR (hospital order, performed in [place] hospital lab) Nasopharyngeal Nasopharyngeal Swab     Status: None   Collection Time: 07/16/19  4:44 AM   Specimen: Nasopharyngeal Swab  Result Value Ref Range Status   SARS Coronavirus 2 NEGATIVE NEGATIVE Final    Comment: (NOTE) If result is NEGATIVE SARS-CoV-2 target nucleic acids are NOT DETECTED. The SARS-CoV-2 RNA is generally detectable in upper and lower  respiratory specimens during the acute phase of infection. The lowest  concentration of SARS-CoV-2 viral copies this assay can detect is 250  copies / mL. A negative result does not preclude SARS-CoV-2 infection  and should not be used as the sole basis for treatment or other  patient management decisions.  A negative result may occur with  improper specimen collection / handling, submission of specimen other  than nasopharyngeal swab, presence of viral mutation(s) within the  areas targeted by this assay, and inadequate number of viral copies  (<250 copies / mL). A negative result must be combined with clinical  observations, patient history, and epidemiological information. If result is POSITIVE SARS-CoV-2 target nucleic acids are DETECTED. The SARS-CoV-2 RNA is generally detectable in upper and lower  respiratory specimens dur ing the acute phase of infection.  Positive  results are indicative of active  infection with SARS-CoV-2.  Clinical  correlation with patient history and other diagnostic information is  necessary to determine patient infection status.  Positive results do  not rule out bacterial infection or co-infection with other viruses. If result is PRESUMPTIVE POSTIVE SARS-CoV-2 nucleic acids MAY BE PRESENT.   A presumptive  positive result was obtained on the submitted specimen  and confirmed on repeat testing.  While 2019 novel coronavirus  (SARS-CoV-2) nucleic acids may be present in the submitted sample  additional confirmatory testing may be necessary for epidemiological  and / or clinical management purposes  to differentiate between  SARS-CoV-2 and other Sarbecovirus currently known to infect humans.  If clinically indicated additional testing with an alternate test  methodology [phone]) is advised. The SARS-CoV-2 RNA is generally  detectable in upper and lower respiratory sp ecimens during the acute  phase of infection. The expected result is Negative. Fact Sheet for Patients:  BoilerBrush.com.cy Fact Sheet for Healthcare Providers: https://pope.com/ This test is not yet approved or cleared by the [place] FDA and has been authorized for detection and/or diagnosis of SARS-CoV-2 by FDA under an Emergency Use Authorization (EUA).  This EUA will remain in effect (meaning this test can be used) for the duration of the COVID-19 declaration under Section 564(b)(1) of the Act, 21 U.S.C. section 360bbb-3(b)(1), unless the authorization is terminated or revoked sooner. Performed at [facility], [address], [place], [place] [address]      Time coordinating discharge: 32 minutes  SIGNED:   Dorcas Carrow, MD  [facility] 07/18/2019, 9:56 AM

## 2019-07-18 NOTE — Progress Notes (Signed)
Patient discharged home with husband. Discharge paperwork explained to patient and husband who verbalized understanding. Pt assited to vehicle by W/C with assist from nursing staff. Pt was able to stand and pivot into vehicle with assist. Pt was very weak, but husband stated he had "been doing this for 21 years." Husband able to assist wife- felt comfortable getting her out of his car at home using assist chair. All belongings sent with patient.

## 2019-07-19 ENCOUNTER — Other Ambulatory Visit: Payer: Self-pay | Admitting: Internal Medicine

## 2019-07-20 ENCOUNTER — Telehealth: Payer: Self-pay | Admitting: Internal Medicine

## 2019-07-20 DIAGNOSIS — J9601 Acute respiratory failure with hypoxia: Secondary | ICD-10-CM | POA: Diagnosis not present

## 2019-07-20 DIAGNOSIS — G40409 Other generalized epilepsy and epileptic syndromes, not intractable, without status epilepticus: Secondary | ICD-10-CM | POA: Diagnosis not present

## 2019-07-20 DIAGNOSIS — G20 Parkinson's disease: Secondary | ICD-10-CM | POA: Diagnosis not present

## 2019-07-20 DIAGNOSIS — K219 Gastro-esophageal reflux disease without esophagitis: Secondary | ICD-10-CM | POA: Diagnosis not present

## 2019-07-20 DIAGNOSIS — E538 Deficiency of other specified B group vitamins: Secondary | ICD-10-CM | POA: Diagnosis not present

## 2019-07-20 DIAGNOSIS — G894 Chronic pain syndrome: Secondary | ICD-10-CM | POA: Diagnosis not present

## 2019-07-20 DIAGNOSIS — Z87440 Personal history of urinary (tract) infections: Secondary | ICD-10-CM | POA: Diagnosis not present

## 2019-07-20 DIAGNOSIS — Z7984 Long term (current) use of oral hypoglycemic drugs: Secondary | ICD-10-CM | POA: Diagnosis not present

## 2019-07-20 DIAGNOSIS — Z8781 Personal history of (healed) traumatic fracture: Secondary | ICD-10-CM | POA: Diagnosis not present

## 2019-07-20 DIAGNOSIS — J449 Chronic obstructive pulmonary disease, unspecified: Secondary | ICD-10-CM | POA: Diagnosis not present

## 2019-07-20 DIAGNOSIS — I69398 Other sequelae of cerebral infarction: Secondary | ICD-10-CM | POA: Diagnosis not present

## 2019-07-20 DIAGNOSIS — M1990 Unspecified osteoarthritis, unspecified site: Secondary | ICD-10-CM | POA: Diagnosis not present

## 2019-07-20 DIAGNOSIS — F329 Major depressive disorder, single episode, unspecified: Secondary | ICD-10-CM | POA: Diagnosis not present

## 2019-07-20 DIAGNOSIS — E1165 Type 2 diabetes mellitus with hyperglycemia: Secondary | ICD-10-CM | POA: Diagnosis not present

## 2019-07-20 DIAGNOSIS — Z87891 Personal history of nicotine dependence: Secondary | ICD-10-CM | POA: Diagnosis not present

## 2019-07-20 DIAGNOSIS — Z7982 Long term (current) use of aspirin: Secondary | ICD-10-CM | POA: Diagnosis not present

## 2019-07-20 DIAGNOSIS — G931 Anoxic brain damage, not elsewhere classified: Secondary | ICD-10-CM | POA: Diagnosis not present

## 2019-07-20 DIAGNOSIS — E1140 Type 2 diabetes mellitus with diabetic neuropathy, unspecified: Secondary | ICD-10-CM | POA: Diagnosis not present

## 2019-07-20 DIAGNOSIS — F419 Anxiety disorder, unspecified: Secondary | ICD-10-CM | POA: Diagnosis not present

## 2019-07-20 DIAGNOSIS — Z79899 Other long term (current) drug therapy: Secondary | ICD-10-CM | POA: Diagnosis not present

## 2019-07-20 NOTE — Telephone Encounter (Signed)
She needs to schedule follow-up with him; was discharged from hospital 2 days ago and needs hospital follow-up on this as well.

## 2019-07-20 NOTE — Telephone Encounter (Signed)
Please advise about refill in Dr. Plotnikovs absence. 

## 2019-07-20 NOTE — Progress Notes (Signed)
Subjective: 68 year old female presents the office today for follow-up evaluation of a wound on the left heel.  Overall she states that she is doing much better.  Denies any drainage or pus from the area denies any swelling.  She has followed up with Dr. Gwenlyn Found.  As the wound is doing better we will continue to monitor circulation.  They have been keeping mupirocin ointment on the wound daily. No acute changes since last appointment, and no other complaints at this time.   Objective: AAO x3, NAD DP, PT pulses decreased.  Ulceration of the posterior lateral aspect left heel however appears to be almost healed at this time.  There is no surrounding erythema, ascending cellulitis.  No fluctuance or crepitation.  No malodor. No open lesions or pre-ulcerative lesions.  No pain with calf compression, swelling, warmth, erythema  Assessment: Left heel ulceration, PAD  Plan: -All treatment options discussed with the patient including all alternatives, risks, complications.  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  Continue to monitor however the wound is healing. -Continue mupirocin ointment dressing changes daily.  Continue offloading at all times. Will hold any further oral antibiotics at this time as there is no clinical signs of infection but monitor closely. -Patient encouraged to call the office with any questions, concerns, change in symptoms.   Trula Slade DPM

## 2019-07-20 NOTE — Telephone Encounter (Signed)
Home Health Verbal Orders - Caller/Agency: [facility] Number: [phone] Requesting OT/PT/Skilled Nursing/Social Work/Speech Therapy: PT Frequency:  2 week 4 1 week 4

## 2019-07-21 ENCOUNTER — Telehealth: Payer: Self-pay | Admitting: *Deleted

## 2019-07-21 NOTE — Telephone Encounter (Signed)
Verbals given  

## 2019-07-21 NOTE — Telephone Encounter (Signed)
Pt was on TCM report tried calling to schedule hosp f/u no answer x's 10 rings.Marland KitchenJohny Chess

## 2019-07-21 NOTE — Telephone Encounter (Signed)
FYI

## 2019-07-22 ENCOUNTER — Telehealth: Payer: Self-pay | Admitting: Internal Medicine

## 2019-07-22 DIAGNOSIS — G20 Parkinson's disease: Secondary | ICD-10-CM | POA: Diagnosis not present

## 2019-07-22 DIAGNOSIS — I69398 Other sequelae of cerebral infarction: Secondary | ICD-10-CM | POA: Diagnosis not present

## 2019-07-22 DIAGNOSIS — J449 Chronic obstructive pulmonary disease, unspecified: Secondary | ICD-10-CM | POA: Diagnosis not present

## 2019-07-22 DIAGNOSIS — E1165 Type 2 diabetes mellitus with hyperglycemia: Secondary | ICD-10-CM | POA: Diagnosis not present

## 2019-07-22 DIAGNOSIS — J9601 Acute respiratory failure with hypoxia: Secondary | ICD-10-CM | POA: Diagnosis not present

## 2019-07-22 DIAGNOSIS — E1140 Type 2 diabetes mellitus with diabetic neuropathy, unspecified: Secondary | ICD-10-CM | POA: Diagnosis not present

## 2019-07-22 NOTE — Telephone Encounter (Signed)
RX sent 07/20/19 #90

## 2019-07-22 NOTE — Telephone Encounter (Signed)
Tried calling pt again still no answer x's 10 rings.../lmb 

## 2019-07-22 NOTE — Telephone Encounter (Signed)
[facility] order OT   Frequency: 1 week 1; 2 week 4 and 1 week 3   Phone: [phone]

## 2019-07-22 NOTE — Telephone Encounter (Signed)
[place] Newcomer OT with [facility] called in to make provider aware that pt was just discharged from the hospital and need a refill for Lorazepam, pt is completely out.   Manuela Schwartz asked that I send this high priority  Pharmacy:  [place], [place] - [address] [phone] (Phone) [phone] (Fax)

## 2019-07-22 NOTE — Telephone Encounter (Signed)
Verbals given, FYI 

## 2019-07-23 ENCOUNTER — Ambulatory Visit: Payer: Medicare Other | Admitting: Orthotics

## 2019-07-23 ENCOUNTER — Other Ambulatory Visit: Payer: Self-pay

## 2019-07-23 ENCOUNTER — Ambulatory Visit (INDEPENDENT_AMBULATORY_CARE_PROVIDER_SITE_OTHER): Payer: Medicare Other | Admitting: Podiatry

## 2019-07-23 DIAGNOSIS — L97509 Non-pressure chronic ulcer of other part of unspecified foot with unspecified severity: Secondary | ICD-10-CM

## 2019-07-23 DIAGNOSIS — M79672 Pain in left foot: Secondary | ICD-10-CM

## 2019-07-23 DIAGNOSIS — L97421 Non-pressure chronic ulcer of left heel and midfoot limited to breakdown of skin: Secondary | ICD-10-CM

## 2019-07-23 DIAGNOSIS — L84 Corns and callosities: Secondary | ICD-10-CM

## 2019-07-23 DIAGNOSIS — E11621 Type 2 diabetes mellitus with foot ulcer: Secondary | ICD-10-CM

## 2019-07-23 DIAGNOSIS — IMO0002 Reserved for concepts with insufficient information to code with codable children: Secondary | ICD-10-CM

## 2019-07-23 DIAGNOSIS — E1165 Type 2 diabetes mellitus with hyperglycemia: Secondary | ICD-10-CM | POA: Diagnosis not present

## 2019-07-23 DIAGNOSIS — I739 Peripheral vascular disease, unspecified: Secondary | ICD-10-CM

## 2019-07-23 NOTE — Progress Notes (Signed)

## 2019-07-24 DIAGNOSIS — I69398 Other sequelae of cerebral infarction: Secondary | ICD-10-CM | POA: Diagnosis not present

## 2019-07-24 DIAGNOSIS — J449 Chronic obstructive pulmonary disease, unspecified: Secondary | ICD-10-CM | POA: Diagnosis not present

## 2019-07-24 DIAGNOSIS — G20 Parkinson's disease: Secondary | ICD-10-CM | POA: Diagnosis not present

## 2019-07-24 DIAGNOSIS — J9601 Acute respiratory failure with hypoxia: Secondary | ICD-10-CM | POA: Diagnosis not present

## 2019-07-24 DIAGNOSIS — E1165 Type 2 diabetes mellitus with hyperglycemia: Secondary | ICD-10-CM | POA: Diagnosis not present

## 2019-07-24 DIAGNOSIS — E1140 Type 2 diabetes mellitus with diabetic neuropathy, unspecified: Secondary | ICD-10-CM | POA: Diagnosis not present

## 2019-07-24 NOTE — Progress Notes (Signed)
Subjective: 68 year old female presents the office today for follow-up evaluation of a wound on the left heel.  She states that the wound is healed and she is not having any issues.  Denies any drainage or pus or any swelling or redness.  She presents today measured for diabetic shoes as well.  No new concerns otherwise.  Denies any fevers, chills, nausea, vomiting.  No calf pain, chest pain, shortness of breath.  Objective: AAO x3, NAD DP, PT pulses decreased.  Ulceration of the posterior lateral aspect left heel has a small hyperkeratotic lesion but upon debridement there is no ongoing ulceration appears to be healed.  No edema, erythema or any clinical signs of infection.  No other open lesions are identified today. No pain with calf compression, swelling, warmth, erythema  Assessment: Left heel ulceration, PAD-resolved  Plan: -All treatment options discussed with the patient including all alternatives, risks, complications.  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  Continue to monitor however the wound is healing. -Wound is healed.  Debrided some of the hyperkeratotic tissue without any complications or bleeding.  Recommend moisturizer daily.  Offloading.  Monitor for any reoccurrence.  Discussed the importance of daily foot inspection. -She was measured for diabetic shoes today with Liliane Channel  Return in about 3 months (around 10/23/2019).  Trula Slade DPM

## 2019-07-27 ENCOUNTER — Telehealth: Payer: Self-pay | Admitting: Internal Medicine

## 2019-07-27 DIAGNOSIS — G20 Parkinson's disease: Secondary | ICD-10-CM | POA: Diagnosis not present

## 2019-07-27 DIAGNOSIS — J9601 Acute respiratory failure with hypoxia: Secondary | ICD-10-CM | POA: Diagnosis not present

## 2019-07-27 DIAGNOSIS — J449 Chronic obstructive pulmonary disease, unspecified: Secondary | ICD-10-CM | POA: Diagnosis not present

## 2019-07-27 DIAGNOSIS — E1165 Type 2 diabetes mellitus with hyperglycemia: Secondary | ICD-10-CM | POA: Diagnosis not present

## 2019-07-27 DIAGNOSIS — E1140 Type 2 diabetes mellitus with diabetic neuropathy, unspecified: Secondary | ICD-10-CM | POA: Diagnosis not present

## 2019-07-27 DIAGNOSIS — I69398 Other sequelae of cerebral infarction: Secondary | ICD-10-CM | POA: Diagnosis not present

## 2019-07-27 NOTE — Telephone Encounter (Signed)
Patient has been submitted for insurance approval for Prolia injection through the Prolia Portal.  °Waiting on benefits. °Will contact patient once these have been received. °

## 2019-07-28 ENCOUNTER — Ambulatory Visit (INDEPENDENT_AMBULATORY_CARE_PROVIDER_SITE_OTHER): Payer: Medicare Other | Admitting: Cardiovascular Disease

## 2019-07-28 ENCOUNTER — Other Ambulatory Visit: Payer: Self-pay

## 2019-07-28 ENCOUNTER — Encounter: Payer: Self-pay | Admitting: Cardiovascular Disease

## 2019-07-28 DIAGNOSIS — E1165 Type 2 diabetes mellitus with hyperglycemia: Secondary | ICD-10-CM | POA: Diagnosis not present

## 2019-07-28 DIAGNOSIS — J449 Chronic obstructive pulmonary disease, unspecified: Secondary | ICD-10-CM | POA: Diagnosis not present

## 2019-07-28 DIAGNOSIS — E1140 Type 2 diabetes mellitus with diabetic neuropathy, unspecified: Secondary | ICD-10-CM | POA: Diagnosis not present

## 2019-07-28 DIAGNOSIS — L97421 Non-pressure chronic ulcer of left heel and midfoot limited to breakdown of skin: Secondary | ICD-10-CM

## 2019-07-28 DIAGNOSIS — G20 Parkinson's disease: Secondary | ICD-10-CM | POA: Diagnosis not present

## 2019-07-28 DIAGNOSIS — J9601 Acute respiratory failure with hypoxia: Secondary | ICD-10-CM | POA: Diagnosis not present

## 2019-07-28 DIAGNOSIS — E11621 Type 2 diabetes mellitus with foot ulcer: Secondary | ICD-10-CM

## 2019-07-28 DIAGNOSIS — I69398 Other sequelae of cerebral infarction: Secondary | ICD-10-CM | POA: Diagnosis not present

## 2019-07-28 NOTE — Patient Instructions (Signed)
Medication Instructions:  Your physician recommends that you continue on your current medications as directed. Please refer to the Current Medication list given to you today. If you need a refill on your cardiac medications before your next appointment, please call your pharmacy.   Lab work: NONE If you have labs (blood work) drawn today and your tests are completely normal, you will receive your results only by: Marland Kitchen MyChart Message (if you have MyChart) OR . A paper copy in the mail If you have any lab test that is abnormal or we need to change your treatment, we will call you to review the results.  Testing/Procedures: NONE  Follow-Up: At [facility], you and your health needs are our priority.  As part of our continuing mission to provide you with exceptional heart care, we have created designated Provider Care Teams.  These Care Teams include your primary Cardiologist (physician) and Advanced Practice Providers (APPs -  Physician Assistants and Nurse Practitioners) who all work together to provide you with the care you need, when you need it. Marland Kitchen You MAY SHCEDULE a follow up appointment AS NEEDED. You may see Dr. Gwenlyn Found or one of the following Advanced Practice Providers on your designated Care Team:   . Kerin Ransom, PA-C . Daleen Snook Kroeger, PA-C . Sande Rives, PA-C ____________________ . Almyra Deforest, PA-C . Fabian Sharp, PA-C . Jory Sims, DNP . Rosaria Ferries, PA-C

## 2019-07-28 NOTE — Assessment & Plan Note (Signed)
History of diabetic foot ulcer left heel which is since healed since I saw her a month ago.  She did have a high-frequency signal in her left SFA.  She denies claudication.  She did see Dr. Jacqualyn Posey back in the office 07/21/2019 at which time he documented that the ulcer had healed.  She is not having any pain there either.  No further work-up is required at this time.

## 2019-07-28 NOTE — Progress Notes (Signed)
History of diabetic foot ulcer left heel which is since healed since I saw her a month ago.  She did have a high-frequency signal in her left SFA.  She denies claudication.  She did see Dr. Jacqualyn Posey back in the office 07/21/2019 at which time he documented that the ulcer had healed.  She is not having any pain there either.  No further work-up is required at this time.   Lorretta Harp, M.D., [place], [facility], [place] [address] [place], [place]  [address]  [phone] 07/28/2019 3:45 PM

## 2019-07-30 DIAGNOSIS — J449 Chronic obstructive pulmonary disease, unspecified: Secondary | ICD-10-CM | POA: Diagnosis not present

## 2019-07-30 DIAGNOSIS — I69398 Other sequelae of cerebral infarction: Secondary | ICD-10-CM | POA: Diagnosis not present

## 2019-07-30 DIAGNOSIS — E1140 Type 2 diabetes mellitus with diabetic neuropathy, unspecified: Secondary | ICD-10-CM | POA: Diagnosis not present

## 2019-07-30 DIAGNOSIS — G20 Parkinson's disease: Secondary | ICD-10-CM | POA: Diagnosis not present

## 2019-07-30 DIAGNOSIS — E1165 Type 2 diabetes mellitus with hyperglycemia: Secondary | ICD-10-CM | POA: Diagnosis not present

## 2019-07-30 DIAGNOSIS — J9601 Acute respiratory failure with hypoxia: Secondary | ICD-10-CM | POA: Diagnosis not present

## 2019-07-31 DIAGNOSIS — E1165 Type 2 diabetes mellitus with hyperglycemia: Secondary | ICD-10-CM | POA: Diagnosis not present

## 2019-07-31 DIAGNOSIS — J449 Chronic obstructive pulmonary disease, unspecified: Secondary | ICD-10-CM | POA: Diagnosis not present

## 2019-07-31 DIAGNOSIS — I69398 Other sequelae of cerebral infarction: Secondary | ICD-10-CM | POA: Diagnosis not present

## 2019-07-31 DIAGNOSIS — E1140 Type 2 diabetes mellitus with diabetic neuropathy, unspecified: Secondary | ICD-10-CM | POA: Diagnosis not present

## 2019-07-31 DIAGNOSIS — G20 Parkinson's disease: Secondary | ICD-10-CM | POA: Diagnosis not present

## 2019-07-31 DIAGNOSIS — J9601 Acute respiratory failure with hypoxia: Secondary | ICD-10-CM | POA: Diagnosis not present

## 2019-08-03 DIAGNOSIS — J449 Chronic obstructive pulmonary disease, unspecified: Secondary | ICD-10-CM | POA: Diagnosis not present

## 2019-08-03 DIAGNOSIS — J9601 Acute respiratory failure with hypoxia: Secondary | ICD-10-CM | POA: Diagnosis not present

## 2019-08-03 DIAGNOSIS — G20 Parkinson's disease: Secondary | ICD-10-CM | POA: Diagnosis not present

## 2019-08-03 DIAGNOSIS — E1165 Type 2 diabetes mellitus with hyperglycemia: Secondary | ICD-10-CM | POA: Diagnosis not present

## 2019-08-03 DIAGNOSIS — E1140 Type 2 diabetes mellitus with diabetic neuropathy, unspecified: Secondary | ICD-10-CM | POA: Diagnosis not present

## 2019-08-03 DIAGNOSIS — I69398 Other sequelae of cerebral infarction: Secondary | ICD-10-CM | POA: Diagnosis not present

## 2019-08-04 DIAGNOSIS — G20 Parkinson's disease: Secondary | ICD-10-CM | POA: Diagnosis not present

## 2019-08-04 DIAGNOSIS — I69398 Other sequelae of cerebral infarction: Secondary | ICD-10-CM | POA: Diagnosis not present

## 2019-08-04 DIAGNOSIS — J9601 Acute respiratory failure with hypoxia: Secondary | ICD-10-CM | POA: Diagnosis not present

## 2019-08-04 DIAGNOSIS — J449 Chronic obstructive pulmonary disease, unspecified: Secondary | ICD-10-CM | POA: Diagnosis not present

## 2019-08-04 DIAGNOSIS — E1140 Type 2 diabetes mellitus with diabetic neuropathy, unspecified: Secondary | ICD-10-CM | POA: Diagnosis not present

## 2019-08-04 DIAGNOSIS — E1165 Type 2 diabetes mellitus with hyperglycemia: Secondary | ICD-10-CM | POA: Diagnosis not present

## 2019-08-05 DIAGNOSIS — E1140 Type 2 diabetes mellitus with diabetic neuropathy, unspecified: Secondary | ICD-10-CM | POA: Diagnosis not present

## 2019-08-05 DIAGNOSIS — I69398 Other sequelae of cerebral infarction: Secondary | ICD-10-CM | POA: Diagnosis not present

## 2019-08-05 DIAGNOSIS — G20 Parkinson's disease: Secondary | ICD-10-CM | POA: Diagnosis not present

## 2019-08-05 DIAGNOSIS — J449 Chronic obstructive pulmonary disease, unspecified: Secondary | ICD-10-CM | POA: Diagnosis not present

## 2019-08-05 DIAGNOSIS — J9601 Acute respiratory failure with hypoxia: Secondary | ICD-10-CM | POA: Diagnosis not present

## 2019-08-05 DIAGNOSIS — E1165 Type 2 diabetes mellitus with hyperglycemia: Secondary | ICD-10-CM | POA: Diagnosis not present

## 2019-08-06 ENCOUNTER — Ambulatory Visit (INDEPENDENT_AMBULATORY_CARE_PROVIDER_SITE_OTHER): Payer: Medicare Other | Admitting: Internal Medicine

## 2019-08-06 ENCOUNTER — Encounter: Payer: Self-pay | Admitting: Internal Medicine

## 2019-08-06 ENCOUNTER — Other Ambulatory Visit: Payer: Self-pay

## 2019-08-06 VITALS — BP 124/68 | HR 89 | Temp 98.2°F | Ht 65.0 in

## 2019-08-06 DIAGNOSIS — G20 Parkinson's disease: Secondary | ICD-10-CM | POA: Diagnosis not present

## 2019-08-06 DIAGNOSIS — J441 Chronic obstructive pulmonary disease with (acute) exacerbation: Secondary | ICD-10-CM

## 2019-08-06 DIAGNOSIS — Z23 Encounter for immunization: Secondary | ICD-10-CM | POA: Diagnosis not present

## 2019-08-06 DIAGNOSIS — E1140 Type 2 diabetes mellitus with diabetic neuropathy, unspecified: Secondary | ICD-10-CM | POA: Diagnosis not present

## 2019-08-06 DIAGNOSIS — J449 Chronic obstructive pulmonary disease, unspecified: Secondary | ICD-10-CM | POA: Diagnosis not present

## 2019-08-06 DIAGNOSIS — J9601 Acute respiratory failure with hypoxia: Secondary | ICD-10-CM

## 2019-08-06 DIAGNOSIS — F418 Other specified anxiety disorders: Secondary | ICD-10-CM

## 2019-08-06 DIAGNOSIS — I69398 Other sequelae of cerebral infarction: Secondary | ICD-10-CM | POA: Diagnosis not present

## 2019-08-06 DIAGNOSIS — E538 Deficiency of other specified B group vitamins: Secondary | ICD-10-CM | POA: Diagnosis not present

## 2019-08-06 DIAGNOSIS — E1165 Type 2 diabetes mellitus with hyperglycemia: Secondary | ICD-10-CM | POA: Diagnosis not present

## 2019-08-06 MED ORDER — LORAZEPAM 2 MG PO TABS: 2.0000 mg | ORAL_TABLET | Freq: Three times a day (TID) | ORAL | 1 refills | Status: DC | PRN

## 2019-08-06 MED ORDER — METFORMIN HCL ER 750 MG PO TB24: 750.0000 mg | ORAL_TABLET | Freq: Two times a day (BID) | ORAL | 3 refills | Status: DC

## 2019-08-06 MED ORDER — TAMSULOSIN HCL 0.4 MG PO CAPS: 0.4000 mg | ORAL_CAPSULE | Freq: Every day | ORAL | 3 refills | Status: DC

## 2019-08-06 MED ORDER — METOPROLOL TARTRATE 25 MG PO TABS: ORAL_TABLET | ORAL | 3 refills | Status: DC

## 2019-08-06 MED ORDER — ALBUTEROL SULFATE HFA 108 (90 BASE) MCG/ACT IN AERS: 1.0000 | INHALATION_SPRAY | RESPIRATORY_TRACT | 3 refills | Status: DC | PRN

## 2019-08-06 MED ORDER — CARBIDOPA-LEVODOPA 25-250 MG PO TABS: 1.0000 | ORAL_TABLET | Freq: Three times a day (TID) | ORAL | 3 refills | Status: DC

## 2019-08-06 MED ORDER — REPAGLINIDE 2 MG PO TABS: 2.0000 mg | ORAL_TABLET | Freq: Three times a day (TID) | ORAL | 3 refills | Status: DC

## 2019-08-06 MED ORDER — MIRTAZAPINE 15 MG PO TABS: ORAL_TABLET | ORAL | 3 refills | Status: DC

## 2019-08-06 MED ORDER — VITAMIN D3 50 MCG (2000 UT) PO CAPS: 2000.0000 [IU] | ORAL_CAPSULE | Freq: Every day | ORAL | 3 refills | Status: AC

## 2019-08-06 NOTE — Assessment & Plan Note (Signed)
On B12 

## 2019-08-06 NOTE — Addendum Note (Signed)
Addended by: Karren Cobble on: 08/06/2019 03:32 PM   Modules accepted: Orders

## 2019-08-06 NOTE — Assessment & Plan Note (Signed)
Patient was recently treated for left foot and leg pain, left foot ulcer.  In the emergency room, she initially she was noted to be hypoxic 88% on room air, however most of the investigations were normal.  Negative infection work-up.  D-dimer normal.  COVID-19 negative.  Chest x-ray normal.  Saturating well on room air and on ambulation in the hospital.

## 2019-08-06 NOTE — Patient Instructions (Signed)
Valerian root 

## 2019-08-06 NOTE — Assessment & Plan Note (Signed)
Chronic Lorazepam prn  Potential benefits of a long term benzodiazepines  use as well as potential risks  and complications were explained to the patient and were aknowledged. 

## 2019-08-06 NOTE — Progress Notes (Signed)
Subjective:  Patient ID: [name], female    DOB: [birth year]  Age: 68 y.o. MRN: [id number]  CC: No chief complaint on file.   HPI [name] presents for a post-hosp f/u - COPD, Parkinson's, DM. In PT/OT at home - getting stronger  F/u DM, anxiety  Per med records:  " Admit date: 07/16/2019 Discharge date: 07/18/2019  Admitted From: Home Disposition: Home  Recommendations for Outpatient Follow-up:  1. Follow up with PCP in 1-2 weeks 2.   Home Health: PT OT Equipment/Devices: None  Discharge Condition: Stable CODE STATUS: Full code Diet recommendation: Low-carb diet  Discharge summary: Patient has Parkinson's disease, diabetes on oral hypoglycemics, COPD, seizure disorder and history of anoxic brain injury presented to the emergency room with progressive generalized weakness and shortness of breath.  Patient was recently treated for left foot and leg pain, left foot ulcer.  In the emergency room, she initially she was noted to be hypoxic 88% on room air, however most of the investigations were normal.  Negative infection work-up.  D-dimer normal.  COVID-19 negative.  Chest x-ray normal.  Saturating well on room air and on ambulation in the hospital.  Patient was fairly stable.  She was found with profound physical deconditioning in the setting of underlying Parkinson's disease and multiple medical problems.  Was seen by PT OT.  Recommended inpatient therapies at a skilled nursing facility, however patient and family agreed to go home with adequate support at home and do home PT OT.  Discharged home.  No change in her long-term medications were done.  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 albuterol (PROVENTIL HFA;VENTOLIN HFA) 108 (90 Base) MCG/ACT inhaler Inhale 1-2 puffs into the lungs every 6 (six) hours as needed for wheezing or shortness of breath. 1 Inhaler 0  . aspirin 81 MG EC tablet Take 81 mg by mouth daily.      . carbidopa-levodopa (SINEMET IR) 25-250 MG tablet TAKE 1 TABLET BY MOUTH 3 (THREE) TIMES DAILY. PATIENT NEEDS OFFICE VISIT BEFORE REFILLS WILL BE GIVEN (Patient taking differently: Take 1 tablet by mouth 3 (three) times daily. ) 270 tablet 1  . gabapentin (NEURONTIN) 100 MG capsule Take 200 mg by mouth 3 (three) times daily.   2  . glucose blood (ONETOUCH VERIO) test strip Use to check blood sugars twice a day. Dx E11.9 100 each 5  . HYDROmorphone (DILAUDID) 2 MG tablet Take 2-3 tablets (4-6 mg total) by mouth every 6 (six) hours as needed for moderate pain or severe pain. (Patient taking differently: Take 2 mg by mouth every 12 (twelve) hours. ) 75 tablet 0  . LORazepam (ATIVAN) 2 MG tablet Take 1 tablet (2 mg total) by mouth 3 (three) times daily. Office visit needed before refills will be given 90 tablet 0  . metFORMIN (GLUCOPHAGE-XR) 750 MG 24 hr tablet Take 750 mg by mouth 2 (two) times daily.     Marland Kitchen  metoprolol tartrate (LOPRESSOR) 25 MG tablet TAKE ONE-HALF TABLET (12.5 MG TOTAL) BY MOUTH 2 (TWO) TIMES DAILY (Patient taking differently: Take 12.5 mg by mouth 2 (two) times daily. ) 90 tablet 3  . mirtazapine (REMERON) 15 MG tablet TAKE 1 TABLET BY MOUTH AT BEDTIME AT 6-8 PM (Patient taking differently: Take 15 mg by mouth at bedtime. ) 90 tablet 3  . multivitamin-lutein (OCUVITE-LUTEIN) CAPS capsule Take 1 capsule by mouth daily.    [name] DELICA LANCETS 64Q MISC Use to help check blood sugars twice a day. Dx  E11.9 100 each 3  . repaglinide (PRANDIN) 2 MG tablet TAKE 1 TABLET (2 MG TOTAL) BY MOUTH 3 (THREE) TIMES DAILY BEFORE MEALS. 90 tablet 11  . tamsulosin (FLOMAX) 0.4 MG CAPS capsule TAKE 1 CAPSULE (0.4 MG TOTAL) DAILY BY MOUTH. 90 capsule 3  . traMADol-acetaminophen (ULTRACET) 37.5-325 MG tablet      No facility-administered medications prior to visit.     ROS: Review of Systems  Constitutional: Positive for fatigue. Negative for activity change, appetite change, chills and unexpected weight change.  HENT: Negative for congestion, mouth sores and sinus pressure.   Eyes: Negative for visual disturbance.  Respiratory: Positive for shortness of breath. Negative for cough, chest tightness and wheezing.   Gastrointestinal: Negative for abdominal pain and nausea.  Genitourinary: Negative for difficulty urinating, frequency and vaginal pain.  Musculoskeletal: Positive for arthralgias and neck pain. Negative for back pain and gait problem.  Skin: Negative for pallor and rash.  Neurological: Positive for dizziness, weakness and numbness. Negative for tremors and headaches.  Hematological: Bruises/bleeds easily.  Psychiatric/Behavioral: Positive for decreased concentration and sleep disturbance. Negative for confusion, dysphoric mood and suicidal ideas. The patient is nervous/anxious.     Objective:  BP 124/68 (BP Location: Left Arm, Patient Position: Sitting, Cuff Size: Normal)   Pulse 89   Temp 98.2 F (36.8 C) (Oral)   Ht 5\' 5"  (1.651 m)   SpO2 90%   BMI 26.36 kg/m   BP Readings from Last 3 Encounters:  08/06/19 124/68  07/28/19 117/63  07/18/19 137/74    Wt Readings from Last 3 Encounters:  07/28/19 158 lb 6.4 oz (71.8 kg)  07/16/19 165 lb (74.8 kg)  06/24/19 161 lb (73 kg)    Physical Exam Constitutional:      General: She is not in acute distress.    Appearance: She is well-developed.  HENT:     Head: Normocephalic.     Right Ear: External ear normal.     Left Ear:  External ear normal.     Nose: Nose normal.  Eyes:     General:        Right eye: No discharge.        Left eye: No discharge.     Conjunctiva/sclera: Conjunctivae normal.     Pupils: Pupils are equal, round, and reactive to light.  Neck:     Musculoskeletal: Normal range of motion and neck supple.     Thyroid: No thyromegaly.     Vascular: No JVD.     Trachea: No tracheal deviation.  Cardiovascular:     Rate and Rhythm: Normal rate and regular rhythm.     Heart sounds: Normal heart sounds.  Pulmonary:     Effort: No respiratory distress.     Breath sounds: No stridor. No wheezing.  Abdominal:     General: Bowel sounds are normal. There is no distension.     Palpations: Abdomen is soft. There  is no mass.     Tenderness: There is no abdominal tenderness. There is no guarding or rebound.  Musculoskeletal:        General: Tenderness present.  Lymphadenopathy:     Cervical: No cervical adenopathy.  Skin:    Findings: No erythema or rash.  Neurological:     Cranial Nerves: No cranial nerve deficit.     Motor: No abnormal muscle tone.     Coordination: Coordination abnormal.     Deep Tendon Reflexes: Reflexes normal.  Psychiatric:        Behavior: Behavior normal.        Thought Content: Thought content normal.        Judgment: Judgment normal.   in a w/c  Lab Results  Component Value Date   WBC 5.5 07/17/2019   HGB 12.6 07/17/2019   HCT 42.6 07/17/2019   PLT 172 07/17/2019   GLUCOSE 189 (H) 07/17/2019   CHOL 213 (H) 04/16/2017   TRIG 183.0 (H) 04/16/2017   HDL 93.40 04/16/2017   LDLDIRECT 100.8 10/26/2013   LDLCALC 83 04/16/2017   ALT 13 07/16/2019   AST 18 07/16/2019   NA 136 07/17/2019   K 4.6 07/17/2019   CL 99 07/17/2019   CREATININE 0.68 07/17/2019   BUN 13 07/17/2019   CO2 26 07/17/2019   TSH 1.92 03/20/2018   INR 1.12 11/08/2016   HGBA1C 7.1 (H) 07/17/2019    Dg Chest 2 View  Result Date: 07/16/2019 CLINICAL DATA:  Shortness of breath EXAM: CHEST  - 2 VIEW COMPARISON:  08/19/2018 FINDINGS: Normal heart size and mediastinal contours. No acute infiltrate or edema. No effusion or pneumothorax. No acute osseous findings. Remote left humerus fracture repair. Postoperative left upper quadrant. IMPRESSION: No evidence of active disease Electronically Signed   By: Marnee SpringJonathon  Watts M.D.   On: 07/16/2019 04:06    Assessment & Plan:   There are no diagnoses linked to this encounter.   No orders of the defined types were placed in this encounter.    Follow-up: No follow-ups on file.  Sonda PrimesAlex Plotnikov, MD

## 2019-08-10 DIAGNOSIS — J449 Chronic obstructive pulmonary disease, unspecified: Secondary | ICD-10-CM | POA: Diagnosis not present

## 2019-08-10 DIAGNOSIS — G20 Parkinson's disease: Secondary | ICD-10-CM | POA: Diagnosis not present

## 2019-08-10 DIAGNOSIS — I69398 Other sequelae of cerebral infarction: Secondary | ICD-10-CM | POA: Diagnosis not present

## 2019-08-10 DIAGNOSIS — E1165 Type 2 diabetes mellitus with hyperglycemia: Secondary | ICD-10-CM | POA: Diagnosis not present

## 2019-08-10 DIAGNOSIS — M47816 Spondylosis without myelopathy or radiculopathy, lumbar region: Secondary | ICD-10-CM | POA: Diagnosis not present

## 2019-08-10 DIAGNOSIS — J9601 Acute respiratory failure with hypoxia: Secondary | ICD-10-CM | POA: Diagnosis not present

## 2019-08-10 DIAGNOSIS — M79672 Pain in left foot: Secondary | ICD-10-CM | POA: Diagnosis not present

## 2019-08-10 DIAGNOSIS — Z79891 Long term (current) use of opiate analgesic: Secondary | ICD-10-CM | POA: Diagnosis not present

## 2019-08-10 DIAGNOSIS — G894 Chronic pain syndrome: Secondary | ICD-10-CM | POA: Diagnosis not present

## 2019-08-10 DIAGNOSIS — E1140 Type 2 diabetes mellitus with diabetic neuropathy, unspecified: Secondary | ICD-10-CM | POA: Diagnosis not present

## 2019-08-11 DIAGNOSIS — J9601 Acute respiratory failure with hypoxia: Secondary | ICD-10-CM | POA: Diagnosis not present

## 2019-08-11 DIAGNOSIS — Z79891 Long term (current) use of opiate analgesic: Secondary | ICD-10-CM | POA: Diagnosis not present

## 2019-08-11 DIAGNOSIS — G894 Chronic pain syndrome: Secondary | ICD-10-CM | POA: Diagnosis not present

## 2019-08-11 DIAGNOSIS — E1140 Type 2 diabetes mellitus with diabetic neuropathy, unspecified: Secondary | ICD-10-CM | POA: Diagnosis not present

## 2019-08-11 DIAGNOSIS — I69398 Other sequelae of cerebral infarction: Secondary | ICD-10-CM | POA: Diagnosis not present

## 2019-08-11 DIAGNOSIS — G20 Parkinson's disease: Secondary | ICD-10-CM | POA: Diagnosis not present

## 2019-08-11 DIAGNOSIS — J449 Chronic obstructive pulmonary disease, unspecified: Secondary | ICD-10-CM | POA: Diagnosis not present

## 2019-08-11 DIAGNOSIS — E1165 Type 2 diabetes mellitus with hyperglycemia: Secondary | ICD-10-CM | POA: Diagnosis not present

## 2019-08-12 ENCOUNTER — Telehealth: Payer: Self-pay | Admitting: Internal Medicine

## 2019-08-12 DIAGNOSIS — J449 Chronic obstructive pulmonary disease, unspecified: Secondary | ICD-10-CM | POA: Diagnosis not present

## 2019-08-12 DIAGNOSIS — E1140 Type 2 diabetes mellitus with diabetic neuropathy, unspecified: Secondary | ICD-10-CM | POA: Diagnosis not present

## 2019-08-12 DIAGNOSIS — R32 Unspecified urinary incontinence: Secondary | ICD-10-CM

## 2019-08-12 DIAGNOSIS — I69398 Other sequelae of cerebral infarction: Secondary | ICD-10-CM | POA: Diagnosis not present

## 2019-08-12 DIAGNOSIS — G20 Parkinson's disease: Secondary | ICD-10-CM | POA: Diagnosis not present

## 2019-08-12 DIAGNOSIS — J9601 Acute respiratory failure with hypoxia: Secondary | ICD-10-CM | POA: Diagnosis not present

## 2019-08-12 DIAGNOSIS — E1165 Type 2 diabetes mellitus with hyperglycemia: Secondary | ICD-10-CM | POA: Diagnosis not present

## 2019-08-12 NOTE — Telephone Encounter (Signed)
Copied from [place] [id number]. Topic: General - Other >> Aug 12, 2019  3:10 PM Keene Breath wrote: Reason for CRM: Called to get script for patient's incontinent supplies.  They would like the orders faxed if possible so patient can get it as soon as possible.  Fax# [phone], Call back # [phone]

## 2019-08-13 DIAGNOSIS — G20 Parkinson's disease: Secondary | ICD-10-CM | POA: Diagnosis not present

## 2019-08-13 DIAGNOSIS — E1140 Type 2 diabetes mellitus with diabetic neuropathy, unspecified: Secondary | ICD-10-CM | POA: Diagnosis not present

## 2019-08-13 DIAGNOSIS — J9601 Acute respiratory failure with hypoxia: Secondary | ICD-10-CM | POA: Diagnosis not present

## 2019-08-13 DIAGNOSIS — I69398 Other sequelae of cerebral infarction: Secondary | ICD-10-CM | POA: Diagnosis not present

## 2019-08-13 DIAGNOSIS — E1165 Type 2 diabetes mellitus with hyperglycemia: Secondary | ICD-10-CM | POA: Diagnosis not present

## 2019-08-13 DIAGNOSIS — J449 Chronic obstructive pulmonary disease, unspecified: Secondary | ICD-10-CM | POA: Diagnosis not present

## 2019-08-13 NOTE — Telephone Encounter (Signed)
Ok Thx 

## 2019-08-13 NOTE — Telephone Encounter (Signed)
Okay to send orders 

## 2019-08-17 DIAGNOSIS — E1165 Type 2 diabetes mellitus with hyperglycemia: Secondary | ICD-10-CM | POA: Diagnosis not present

## 2019-08-17 DIAGNOSIS — J449 Chronic obstructive pulmonary disease, unspecified: Secondary | ICD-10-CM | POA: Diagnosis not present

## 2019-08-17 DIAGNOSIS — I69398 Other sequelae of cerebral infarction: Secondary | ICD-10-CM | POA: Diagnosis not present

## 2019-08-17 DIAGNOSIS — J9601 Acute respiratory failure with hypoxia: Secondary | ICD-10-CM | POA: Diagnosis not present

## 2019-08-17 DIAGNOSIS — E1140 Type 2 diabetes mellitus with diabetic neuropathy, unspecified: Secondary | ICD-10-CM | POA: Diagnosis not present

## 2019-08-17 DIAGNOSIS — G20 Parkinson's disease: Secondary | ICD-10-CM | POA: Diagnosis not present

## 2019-08-18 DIAGNOSIS — E1140 Type 2 diabetes mellitus with diabetic neuropathy, unspecified: Secondary | ICD-10-CM | POA: Diagnosis not present

## 2019-08-18 DIAGNOSIS — G20 Parkinson's disease: Secondary | ICD-10-CM | POA: Diagnosis not present

## 2019-08-18 DIAGNOSIS — J449 Chronic obstructive pulmonary disease, unspecified: Secondary | ICD-10-CM | POA: Diagnosis not present

## 2019-08-18 DIAGNOSIS — E1165 Type 2 diabetes mellitus with hyperglycemia: Secondary | ICD-10-CM | POA: Diagnosis not present

## 2019-08-18 DIAGNOSIS — J9601 Acute respiratory failure with hypoxia: Secondary | ICD-10-CM | POA: Diagnosis not present

## 2019-08-18 DIAGNOSIS — I69398 Other sequelae of cerebral infarction: Secondary | ICD-10-CM | POA: Diagnosis not present

## 2019-08-19 DIAGNOSIS — Z8781 Personal history of (healed) traumatic fracture: Secondary | ICD-10-CM | POA: Diagnosis not present

## 2019-08-19 DIAGNOSIS — Z87891 Personal history of nicotine dependence: Secondary | ICD-10-CM | POA: Diagnosis not present

## 2019-08-19 DIAGNOSIS — F329 Major depressive disorder, single episode, unspecified: Secondary | ICD-10-CM | POA: Diagnosis not present

## 2019-08-19 DIAGNOSIS — E538 Deficiency of other specified B group vitamins: Secondary | ICD-10-CM | POA: Diagnosis not present

## 2019-08-19 DIAGNOSIS — M1990 Unspecified osteoarthritis, unspecified site: Secondary | ICD-10-CM | POA: Diagnosis not present

## 2019-08-19 DIAGNOSIS — Z79899 Other long term (current) drug therapy: Secondary | ICD-10-CM | POA: Diagnosis not present

## 2019-08-19 DIAGNOSIS — J449 Chronic obstructive pulmonary disease, unspecified: Secondary | ICD-10-CM | POA: Diagnosis not present

## 2019-08-19 DIAGNOSIS — F419 Anxiety disorder, unspecified: Secondary | ICD-10-CM | POA: Diagnosis not present

## 2019-08-19 DIAGNOSIS — G894 Chronic pain syndrome: Secondary | ICD-10-CM | POA: Diagnosis not present

## 2019-08-19 DIAGNOSIS — G931 Anoxic brain damage, not elsewhere classified: Secondary | ICD-10-CM | POA: Diagnosis not present

## 2019-08-19 DIAGNOSIS — G40409 Other generalized epilepsy and epileptic syndromes, not intractable, without status epilepticus: Secondary | ICD-10-CM | POA: Diagnosis not present

## 2019-08-19 DIAGNOSIS — E1165 Type 2 diabetes mellitus with hyperglycemia: Secondary | ICD-10-CM | POA: Diagnosis not present

## 2019-08-19 DIAGNOSIS — E1140 Type 2 diabetes mellitus with diabetic neuropathy, unspecified: Secondary | ICD-10-CM | POA: Diagnosis not present

## 2019-08-19 DIAGNOSIS — Z87440 Personal history of urinary (tract) infections: Secondary | ICD-10-CM | POA: Diagnosis not present

## 2019-08-19 DIAGNOSIS — G20 Parkinson's disease: Secondary | ICD-10-CM | POA: Diagnosis not present

## 2019-08-19 DIAGNOSIS — Z7982 Long term (current) use of aspirin: Secondary | ICD-10-CM | POA: Diagnosis not present

## 2019-08-19 DIAGNOSIS — K219 Gastro-esophageal reflux disease without esophagitis: Secondary | ICD-10-CM | POA: Diagnosis not present

## 2019-08-19 DIAGNOSIS — I69398 Other sequelae of cerebral infarction: Secondary | ICD-10-CM | POA: Diagnosis not present

## 2019-08-19 DIAGNOSIS — Z7984 Long term (current) use of oral hypoglycemic drugs: Secondary | ICD-10-CM | POA: Diagnosis not present

## 2019-08-19 DIAGNOSIS — J9601 Acute respiratory failure with hypoxia: Secondary | ICD-10-CM | POA: Diagnosis not present

## 2019-08-19 NOTE — Telephone Encounter (Signed)
faxed

## 2019-08-20 ENCOUNTER — Telehealth: Payer: Self-pay | Admitting: Internal Medicine

## 2019-08-20 NOTE — Telephone Encounter (Signed)
Janett Billow, from [facility], called and is requesting to know if fax has been received for pts incontinent supplies. Janett Billow states fax was sent over on 08/18/19, 08/19/19, 08/20/19. Please advise.    Fax # [phone]    [phone]

## 2019-08-21 DIAGNOSIS — G20 Parkinson's disease: Secondary | ICD-10-CM | POA: Diagnosis not present

## 2019-08-21 DIAGNOSIS — G894 Chronic pain syndrome: Secondary | ICD-10-CM | POA: Diagnosis not present

## 2019-08-21 DIAGNOSIS — Z79899 Other long term (current) drug therapy: Secondary | ICD-10-CM

## 2019-08-21 DIAGNOSIS — F329 Major depressive disorder, single episode, unspecified: Secondary | ICD-10-CM | POA: Diagnosis not present

## 2019-08-21 DIAGNOSIS — Z8781 Personal history of (healed) traumatic fracture: Secondary | ICD-10-CM

## 2019-08-21 DIAGNOSIS — Z87891 Personal history of nicotine dependence: Secondary | ICD-10-CM

## 2019-08-21 DIAGNOSIS — M1990 Unspecified osteoarthritis, unspecified site: Secondary | ICD-10-CM | POA: Diagnosis not present

## 2019-08-21 DIAGNOSIS — G40409 Other generalized epilepsy and epileptic syndromes, not intractable, without status epilepticus: Secondary | ICD-10-CM | POA: Diagnosis not present

## 2019-08-21 DIAGNOSIS — F419 Anxiety disorder, unspecified: Secondary | ICD-10-CM

## 2019-08-21 DIAGNOSIS — I69398 Other sequelae of cerebral infarction: Secondary | ICD-10-CM | POA: Diagnosis not present

## 2019-08-21 DIAGNOSIS — J449 Chronic obstructive pulmonary disease, unspecified: Secondary | ICD-10-CM | POA: Diagnosis not present

## 2019-08-21 DIAGNOSIS — K219 Gastro-esophageal reflux disease without esophagitis: Secondary | ICD-10-CM

## 2019-08-21 DIAGNOSIS — Z87440 Personal history of urinary (tract) infections: Secondary | ICD-10-CM

## 2019-08-21 DIAGNOSIS — Z7984 Long term (current) use of oral hypoglycemic drugs: Secondary | ICD-10-CM

## 2019-08-21 DIAGNOSIS — E538 Deficiency of other specified B group vitamins: Secondary | ICD-10-CM | POA: Diagnosis not present

## 2019-08-21 DIAGNOSIS — E1165 Type 2 diabetes mellitus with hyperglycemia: Secondary | ICD-10-CM | POA: Diagnosis not present

## 2019-08-21 DIAGNOSIS — G931 Anoxic brain damage, not elsewhere classified: Secondary | ICD-10-CM

## 2019-08-21 DIAGNOSIS — E1140 Type 2 diabetes mellitus with diabetic neuropathy, unspecified: Secondary | ICD-10-CM | POA: Diagnosis not present

## 2019-08-21 DIAGNOSIS — Z7982 Long term (current) use of aspirin: Secondary | ICD-10-CM

## 2019-08-24 DIAGNOSIS — G20 Parkinson's disease: Secondary | ICD-10-CM | POA: Diagnosis not present

## 2019-08-24 DIAGNOSIS — E1165 Type 2 diabetes mellitus with hyperglycemia: Secondary | ICD-10-CM | POA: Diagnosis not present

## 2019-08-24 DIAGNOSIS — E1140 Type 2 diabetes mellitus with diabetic neuropathy, unspecified: Secondary | ICD-10-CM | POA: Diagnosis not present

## 2019-08-24 DIAGNOSIS — J449 Chronic obstructive pulmonary disease, unspecified: Secondary | ICD-10-CM | POA: Diagnosis not present

## 2019-08-24 DIAGNOSIS — J9601 Acute respiratory failure with hypoxia: Secondary | ICD-10-CM | POA: Diagnosis not present

## 2019-08-24 DIAGNOSIS — I69398 Other sequelae of cerebral infarction: Secondary | ICD-10-CM | POA: Diagnosis not present

## 2019-08-24 NOTE — Telephone Encounter (Signed)
faxed

## 2019-08-25 DIAGNOSIS — E1165 Type 2 diabetes mellitus with hyperglycemia: Secondary | ICD-10-CM | POA: Diagnosis not present

## 2019-08-25 DIAGNOSIS — G20 Parkinson's disease: Secondary | ICD-10-CM | POA: Diagnosis not present

## 2019-08-25 DIAGNOSIS — J9601 Acute respiratory failure with hypoxia: Secondary | ICD-10-CM | POA: Diagnosis not present

## 2019-08-25 DIAGNOSIS — J449 Chronic obstructive pulmonary disease, unspecified: Secondary | ICD-10-CM | POA: Diagnosis not present

## 2019-08-25 DIAGNOSIS — E1140 Type 2 diabetes mellitus with diabetic neuropathy, unspecified: Secondary | ICD-10-CM | POA: Diagnosis not present

## 2019-08-25 DIAGNOSIS — I69398 Other sequelae of cerebral infarction: Secondary | ICD-10-CM | POA: Diagnosis not present

## 2019-08-31 DIAGNOSIS — J9601 Acute respiratory failure with hypoxia: Secondary | ICD-10-CM | POA: Diagnosis not present

## 2019-08-31 DIAGNOSIS — J449 Chronic obstructive pulmonary disease, unspecified: Secondary | ICD-10-CM | POA: Diagnosis not present

## 2019-08-31 DIAGNOSIS — E1140 Type 2 diabetes mellitus with diabetic neuropathy, unspecified: Secondary | ICD-10-CM | POA: Diagnosis not present

## 2019-08-31 DIAGNOSIS — G20 Parkinson's disease: Secondary | ICD-10-CM | POA: Diagnosis not present

## 2019-08-31 DIAGNOSIS — I69398 Other sequelae of cerebral infarction: Secondary | ICD-10-CM | POA: Diagnosis not present

## 2019-08-31 DIAGNOSIS — E1165 Type 2 diabetes mellitus with hyperglycemia: Secondary | ICD-10-CM | POA: Diagnosis not present

## 2019-09-01 DIAGNOSIS — E1165 Type 2 diabetes mellitus with hyperglycemia: Secondary | ICD-10-CM | POA: Diagnosis not present

## 2019-09-01 DIAGNOSIS — E1140 Type 2 diabetes mellitus with diabetic neuropathy, unspecified: Secondary | ICD-10-CM | POA: Diagnosis not present

## 2019-09-01 DIAGNOSIS — J449 Chronic obstructive pulmonary disease, unspecified: Secondary | ICD-10-CM | POA: Diagnosis not present

## 2019-09-01 DIAGNOSIS — J9601 Acute respiratory failure with hypoxia: Secondary | ICD-10-CM | POA: Diagnosis not present

## 2019-09-01 DIAGNOSIS — G20 Parkinson's disease: Secondary | ICD-10-CM | POA: Diagnosis not present

## 2019-09-01 DIAGNOSIS — I69398 Other sequelae of cerebral infarction: Secondary | ICD-10-CM | POA: Diagnosis not present

## 2019-09-04 ENCOUNTER — Other Ambulatory Visit: Payer: Self-pay

## 2019-09-04 ENCOUNTER — Ambulatory Visit: Payer: Medicare Other | Admitting: Orthotics

## 2019-09-04 DIAGNOSIS — E1159 Type 2 diabetes mellitus with other circulatory complications: Secondary | ICD-10-CM | POA: Diagnosis not present

## 2019-09-04 DIAGNOSIS — E1165 Type 2 diabetes mellitus with hyperglycemia: Secondary | ICD-10-CM | POA: Diagnosis not present

## 2019-09-04 DIAGNOSIS — L89619 Pressure ulcer of right heel, unspecified stage: Secondary | ICD-10-CM

## 2019-09-09 DIAGNOSIS — G20 Parkinson's disease: Secondary | ICD-10-CM | POA: Diagnosis not present

## 2019-09-09 DIAGNOSIS — E1165 Type 2 diabetes mellitus with hyperglycemia: Secondary | ICD-10-CM | POA: Diagnosis not present

## 2019-09-09 DIAGNOSIS — E1140 Type 2 diabetes mellitus with diabetic neuropathy, unspecified: Secondary | ICD-10-CM | POA: Diagnosis not present

## 2019-09-09 DIAGNOSIS — J449 Chronic obstructive pulmonary disease, unspecified: Secondary | ICD-10-CM | POA: Diagnosis not present

## 2019-09-09 DIAGNOSIS — I69398 Other sequelae of cerebral infarction: Secondary | ICD-10-CM | POA: Diagnosis not present

## 2019-09-09 DIAGNOSIS — J9601 Acute respiratory failure with hypoxia: Secondary | ICD-10-CM | POA: Diagnosis not present

## 2019-09-11 DIAGNOSIS — I69398 Other sequelae of cerebral infarction: Secondary | ICD-10-CM | POA: Diagnosis not present

## 2019-09-11 DIAGNOSIS — J9601 Acute respiratory failure with hypoxia: Secondary | ICD-10-CM | POA: Diagnosis not present

## 2019-09-11 DIAGNOSIS — E1165 Type 2 diabetes mellitus with hyperglycemia: Secondary | ICD-10-CM | POA: Diagnosis not present

## 2019-09-11 DIAGNOSIS — J449 Chronic obstructive pulmonary disease, unspecified: Secondary | ICD-10-CM | POA: Diagnosis not present

## 2019-09-11 DIAGNOSIS — G20 Parkinson's disease: Secondary | ICD-10-CM | POA: Diagnosis not present

## 2019-09-11 DIAGNOSIS — E1140 Type 2 diabetes mellitus with diabetic neuropathy, unspecified: Secondary | ICD-10-CM | POA: Diagnosis not present

## 2019-09-14 ENCOUNTER — Telehealth: Payer: Self-pay

## 2019-09-14 NOTE — Telephone Encounter (Signed)
LM giving verbals, FYI 

## 2019-09-14 NOTE — Telephone Encounter (Signed)
Copied from [place] [phone] [phone]. Topic: Quick Communication - Home Health Verbal Orders >> Sep 14, 2019 10:15 AM Virl Axe D wrote: Caller/Agency: Claiborne Billings, PT/[facility] Callback Number:  Requesting OT/PT/Skilled Nursing/Social Work/Speech Therapy: PT Frequency: 1 every other week 3 / 1 week 6

## 2019-09-16 DIAGNOSIS — E1140 Type 2 diabetes mellitus with diabetic neuropathy, unspecified: Secondary | ICD-10-CM | POA: Diagnosis not present

## 2019-09-16 DIAGNOSIS — J9601 Acute respiratory failure with hypoxia: Secondary | ICD-10-CM | POA: Diagnosis not present

## 2019-09-16 DIAGNOSIS — I69398 Other sequelae of cerebral infarction: Secondary | ICD-10-CM | POA: Diagnosis not present

## 2019-09-16 DIAGNOSIS — G20 Parkinson's disease: Secondary | ICD-10-CM | POA: Diagnosis not present

## 2019-09-16 DIAGNOSIS — J449 Chronic obstructive pulmonary disease, unspecified: Secondary | ICD-10-CM | POA: Diagnosis not present

## 2019-09-16 DIAGNOSIS — E1165 Type 2 diabetes mellitus with hyperglycemia: Secondary | ICD-10-CM | POA: Diagnosis not present

## 2019-09-18 DIAGNOSIS — J449 Chronic obstructive pulmonary disease, unspecified: Secondary | ICD-10-CM | POA: Diagnosis not present

## 2019-09-18 DIAGNOSIS — Z7982 Long term (current) use of aspirin: Secondary | ICD-10-CM | POA: Diagnosis not present

## 2019-09-18 DIAGNOSIS — G20 Parkinson's disease: Secondary | ICD-10-CM | POA: Diagnosis not present

## 2019-09-18 DIAGNOSIS — I69398 Other sequelae of cerebral infarction: Secondary | ICD-10-CM | POA: Diagnosis not present

## 2019-09-18 DIAGNOSIS — G40909 Epilepsy, unspecified, not intractable, without status epilepticus: Secondary | ICD-10-CM | POA: Diagnosis not present

## 2019-09-18 DIAGNOSIS — G931 Anoxic brain damage, not elsewhere classified: Secondary | ICD-10-CM | POA: Diagnosis not present

## 2019-09-18 DIAGNOSIS — Z7984 Long term (current) use of oral hypoglycemic drugs: Secondary | ICD-10-CM | POA: Diagnosis not present

## 2019-09-18 DIAGNOSIS — Z79899 Other long term (current) drug therapy: Secondary | ICD-10-CM | POA: Diagnosis not present

## 2019-09-18 DIAGNOSIS — E538 Deficiency of other specified B group vitamins: Secondary | ICD-10-CM | POA: Diagnosis not present

## 2019-09-18 DIAGNOSIS — F329 Major depressive disorder, single episode, unspecified: Secondary | ICD-10-CM | POA: Diagnosis not present

## 2019-09-18 DIAGNOSIS — Z87440 Personal history of urinary (tract) infections: Secondary | ICD-10-CM | POA: Diagnosis not present

## 2019-09-18 DIAGNOSIS — F419 Anxiety disorder, unspecified: Secondary | ICD-10-CM | POA: Diagnosis not present

## 2019-09-18 DIAGNOSIS — E1140 Type 2 diabetes mellitus with diabetic neuropathy, unspecified: Secondary | ICD-10-CM | POA: Diagnosis not present

## 2019-09-18 DIAGNOSIS — M1990 Unspecified osteoarthritis, unspecified site: Secondary | ICD-10-CM | POA: Diagnosis not present

## 2019-09-18 DIAGNOSIS — Z8781 Personal history of (healed) traumatic fracture: Secondary | ICD-10-CM | POA: Diagnosis not present

## 2019-09-18 DIAGNOSIS — J9601 Acute respiratory failure with hypoxia: Secondary | ICD-10-CM | POA: Diagnosis not present

## 2019-09-18 DIAGNOSIS — Z9181 History of falling: Secondary | ICD-10-CM | POA: Diagnosis not present

## 2019-09-18 DIAGNOSIS — Z87891 Personal history of nicotine dependence: Secondary | ICD-10-CM | POA: Diagnosis not present

## 2019-09-18 DIAGNOSIS — E1165 Type 2 diabetes mellitus with hyperglycemia: Secondary | ICD-10-CM | POA: Diagnosis not present

## 2019-09-18 DIAGNOSIS — K219 Gastro-esophageal reflux disease without esophagitis: Secondary | ICD-10-CM | POA: Diagnosis not present

## 2019-09-21 DIAGNOSIS — G40909 Epilepsy, unspecified, not intractable, without status epilepticus: Secondary | ICD-10-CM | POA: Diagnosis not present

## 2019-09-21 DIAGNOSIS — E1140 Type 2 diabetes mellitus with diabetic neuropathy, unspecified: Secondary | ICD-10-CM | POA: Diagnosis not present

## 2019-09-21 DIAGNOSIS — I69398 Other sequelae of cerebral infarction: Secondary | ICD-10-CM | POA: Diagnosis not present

## 2019-09-21 DIAGNOSIS — J449 Chronic obstructive pulmonary disease, unspecified: Secondary | ICD-10-CM | POA: Diagnosis not present

## 2019-09-21 DIAGNOSIS — G20 Parkinson's disease: Secondary | ICD-10-CM | POA: Diagnosis not present

## 2019-09-21 DIAGNOSIS — E1165 Type 2 diabetes mellitus with hyperglycemia: Secondary | ICD-10-CM | POA: Diagnosis not present

## 2019-09-28 DIAGNOSIS — I69398 Other sequelae of cerebral infarction: Secondary | ICD-10-CM | POA: Diagnosis not present

## 2019-09-28 DIAGNOSIS — E1140 Type 2 diabetes mellitus with diabetic neuropathy, unspecified: Secondary | ICD-10-CM | POA: Diagnosis not present

## 2019-09-28 DIAGNOSIS — J449 Chronic obstructive pulmonary disease, unspecified: Secondary | ICD-10-CM | POA: Diagnosis not present

## 2019-09-28 DIAGNOSIS — G40909 Epilepsy, unspecified, not intractable, without status epilepticus: Secondary | ICD-10-CM | POA: Diagnosis not present

## 2019-09-28 DIAGNOSIS — E1165 Type 2 diabetes mellitus with hyperglycemia: Secondary | ICD-10-CM | POA: Diagnosis not present

## 2019-09-28 DIAGNOSIS — G20 Parkinson's disease: Secondary | ICD-10-CM | POA: Diagnosis not present

## 2019-09-29 DIAGNOSIS — G20 Parkinson's disease: Secondary | ICD-10-CM | POA: Diagnosis not present

## 2019-09-29 DIAGNOSIS — I69398 Other sequelae of cerebral infarction: Secondary | ICD-10-CM | POA: Diagnosis not present

## 2019-09-29 DIAGNOSIS — E1140 Type 2 diabetes mellitus with diabetic neuropathy, unspecified: Secondary | ICD-10-CM | POA: Diagnosis not present

## 2019-09-29 DIAGNOSIS — G40909 Epilepsy, unspecified, not intractable, without status epilepticus: Secondary | ICD-10-CM | POA: Diagnosis not present

## 2019-09-29 DIAGNOSIS — E1165 Type 2 diabetes mellitus with hyperglycemia: Secondary | ICD-10-CM | POA: Diagnosis not present

## 2019-09-29 DIAGNOSIS — J449 Chronic obstructive pulmonary disease, unspecified: Secondary | ICD-10-CM | POA: Diagnosis not present

## 2019-10-06 DIAGNOSIS — I69398 Other sequelae of cerebral infarction: Secondary | ICD-10-CM | POA: Diagnosis not present

## 2019-10-06 DIAGNOSIS — G20 Parkinson's disease: Secondary | ICD-10-CM | POA: Diagnosis not present

## 2019-10-06 DIAGNOSIS — E1165 Type 2 diabetes mellitus with hyperglycemia: Secondary | ICD-10-CM | POA: Diagnosis not present

## 2019-10-06 DIAGNOSIS — G40909 Epilepsy, unspecified, not intractable, without status epilepticus: Secondary | ICD-10-CM | POA: Diagnosis not present

## 2019-10-06 DIAGNOSIS — E1140 Type 2 diabetes mellitus with diabetic neuropathy, unspecified: Secondary | ICD-10-CM | POA: Diagnosis not present

## 2019-10-06 DIAGNOSIS — J449 Chronic obstructive pulmonary disease, unspecified: Secondary | ICD-10-CM | POA: Diagnosis not present

## 2019-10-08 DIAGNOSIS — G20 Parkinson's disease: Secondary | ICD-10-CM | POA: Diagnosis not present

## 2019-10-08 DIAGNOSIS — E1165 Type 2 diabetes mellitus with hyperglycemia: Secondary | ICD-10-CM | POA: Diagnosis not present

## 2019-10-08 DIAGNOSIS — G40909 Epilepsy, unspecified, not intractable, without status epilepticus: Secondary | ICD-10-CM | POA: Diagnosis not present

## 2019-10-08 DIAGNOSIS — J449 Chronic obstructive pulmonary disease, unspecified: Secondary | ICD-10-CM | POA: Diagnosis not present

## 2019-10-08 DIAGNOSIS — I69398 Other sequelae of cerebral infarction: Secondary | ICD-10-CM | POA: Diagnosis not present

## 2019-10-08 DIAGNOSIS — E1140 Type 2 diabetes mellitus with diabetic neuropathy, unspecified: Secondary | ICD-10-CM | POA: Diagnosis not present

## 2019-10-12 DIAGNOSIS — E1140 Type 2 diabetes mellitus with diabetic neuropathy, unspecified: Secondary | ICD-10-CM | POA: Diagnosis not present

## 2019-10-12 DIAGNOSIS — G20 Parkinson's disease: Secondary | ICD-10-CM | POA: Diagnosis not present

## 2019-10-12 DIAGNOSIS — E1165 Type 2 diabetes mellitus with hyperglycemia: Secondary | ICD-10-CM | POA: Diagnosis not present

## 2019-10-12 DIAGNOSIS — I69398 Other sequelae of cerebral infarction: Secondary | ICD-10-CM | POA: Diagnosis not present

## 2019-10-12 DIAGNOSIS — J449 Chronic obstructive pulmonary disease, unspecified: Secondary | ICD-10-CM | POA: Diagnosis not present

## 2019-10-12 DIAGNOSIS — G40909 Epilepsy, unspecified, not intractable, without status epilepticus: Secondary | ICD-10-CM | POA: Diagnosis not present

## 2019-10-15 DIAGNOSIS — E1140 Type 2 diabetes mellitus with diabetic neuropathy, unspecified: Secondary | ICD-10-CM | POA: Diagnosis not present

## 2019-10-15 DIAGNOSIS — I69398 Other sequelae of cerebral infarction: Secondary | ICD-10-CM | POA: Diagnosis not present

## 2019-10-15 DIAGNOSIS — G40909 Epilepsy, unspecified, not intractable, without status epilepticus: Secondary | ICD-10-CM | POA: Diagnosis not present

## 2019-10-15 DIAGNOSIS — G20 Parkinson's disease: Secondary | ICD-10-CM | POA: Diagnosis not present

## 2019-10-15 DIAGNOSIS — E1165 Type 2 diabetes mellitus with hyperglycemia: Secondary | ICD-10-CM | POA: Diagnosis not present

## 2019-10-15 DIAGNOSIS — J449 Chronic obstructive pulmonary disease, unspecified: Secondary | ICD-10-CM | POA: Diagnosis not present

## 2019-10-16 DIAGNOSIS — J449 Chronic obstructive pulmonary disease, unspecified: Secondary | ICD-10-CM | POA: Diagnosis not present

## 2019-10-16 DIAGNOSIS — I69398 Other sequelae of cerebral infarction: Secondary | ICD-10-CM | POA: Diagnosis not present

## 2019-10-16 DIAGNOSIS — G40909 Epilepsy, unspecified, not intractable, without status epilepticus: Secondary | ICD-10-CM | POA: Diagnosis not present

## 2019-10-16 DIAGNOSIS — E1165 Type 2 diabetes mellitus with hyperglycemia: Secondary | ICD-10-CM | POA: Diagnosis not present

## 2019-10-16 DIAGNOSIS — G20 Parkinson's disease: Secondary | ICD-10-CM | POA: Diagnosis not present

## 2019-10-16 DIAGNOSIS — E1140 Type 2 diabetes mellitus with diabetic neuropathy, unspecified: Secondary | ICD-10-CM | POA: Diagnosis not present

## 2019-10-18 DIAGNOSIS — J449 Chronic obstructive pulmonary disease, unspecified: Secondary | ICD-10-CM | POA: Diagnosis not present

## 2019-10-18 DIAGNOSIS — E1165 Type 2 diabetes mellitus with hyperglycemia: Secondary | ICD-10-CM | POA: Diagnosis not present

## 2019-10-18 DIAGNOSIS — E538 Deficiency of other specified B group vitamins: Secondary | ICD-10-CM | POA: Diagnosis not present

## 2019-10-18 DIAGNOSIS — G40909 Epilepsy, unspecified, not intractable, without status epilepticus: Secondary | ICD-10-CM | POA: Diagnosis not present

## 2019-10-18 DIAGNOSIS — G20 Parkinson's disease: Secondary | ICD-10-CM | POA: Diagnosis not present

## 2019-10-18 DIAGNOSIS — Z79899 Other long term (current) drug therapy: Secondary | ICD-10-CM | POA: Diagnosis not present

## 2019-10-18 DIAGNOSIS — G894 Chronic pain syndrome: Secondary | ICD-10-CM | POA: Diagnosis not present

## 2019-10-18 DIAGNOSIS — Z87891 Personal history of nicotine dependence: Secondary | ICD-10-CM | POA: Diagnosis not present

## 2019-10-18 DIAGNOSIS — E1140 Type 2 diabetes mellitus with diabetic neuropathy, unspecified: Secondary | ICD-10-CM | POA: Diagnosis not present

## 2019-10-18 DIAGNOSIS — Z7984 Long term (current) use of oral hypoglycemic drugs: Secondary | ICD-10-CM | POA: Diagnosis not present

## 2019-10-18 DIAGNOSIS — I69398 Other sequelae of cerebral infarction: Secondary | ICD-10-CM | POA: Diagnosis not present

## 2019-10-18 DIAGNOSIS — G931 Anoxic brain damage, not elsewhere classified: Secondary | ICD-10-CM | POA: Diagnosis not present

## 2019-10-18 DIAGNOSIS — Z87440 Personal history of urinary (tract) infections: Secondary | ICD-10-CM | POA: Diagnosis not present

## 2019-10-18 DIAGNOSIS — G40409 Other generalized epilepsy and epileptic syndromes, not intractable, without status epilepticus: Secondary | ICD-10-CM | POA: Diagnosis not present

## 2019-10-18 DIAGNOSIS — J9601 Acute respiratory failure with hypoxia: Secondary | ICD-10-CM | POA: Diagnosis not present

## 2019-10-18 DIAGNOSIS — Z8781 Personal history of (healed) traumatic fracture: Secondary | ICD-10-CM | POA: Diagnosis not present

## 2019-10-18 DIAGNOSIS — Z9181 History of falling: Secondary | ICD-10-CM | POA: Diagnosis not present

## 2019-10-18 DIAGNOSIS — Z7982 Long term (current) use of aspirin: Secondary | ICD-10-CM | POA: Diagnosis not present

## 2019-10-18 DIAGNOSIS — K219 Gastro-esophageal reflux disease without esophagitis: Secondary | ICD-10-CM | POA: Diagnosis not present

## 2019-10-18 DIAGNOSIS — F329 Major depressive disorder, single episode, unspecified: Secondary | ICD-10-CM | POA: Diagnosis not present

## 2019-10-18 DIAGNOSIS — F419 Anxiety disorder, unspecified: Secondary | ICD-10-CM | POA: Diagnosis not present

## 2019-10-18 DIAGNOSIS — M1990 Unspecified osteoarthritis, unspecified site: Secondary | ICD-10-CM | POA: Diagnosis not present

## 2019-10-19 DIAGNOSIS — I69398 Other sequelae of cerebral infarction: Secondary | ICD-10-CM | POA: Diagnosis not present

## 2019-10-19 DIAGNOSIS — G40909 Epilepsy, unspecified, not intractable, without status epilepticus: Secondary | ICD-10-CM | POA: Diagnosis not present

## 2019-10-19 DIAGNOSIS — E1140 Type 2 diabetes mellitus with diabetic neuropathy, unspecified: Secondary | ICD-10-CM | POA: Diagnosis not present

## 2019-10-19 DIAGNOSIS — J449 Chronic obstructive pulmonary disease, unspecified: Secondary | ICD-10-CM | POA: Diagnosis not present

## 2019-10-19 DIAGNOSIS — G20 Parkinson's disease: Secondary | ICD-10-CM | POA: Diagnosis not present

## 2019-10-19 DIAGNOSIS — E1165 Type 2 diabetes mellitus with hyperglycemia: Secondary | ICD-10-CM | POA: Diagnosis not present

## 2019-10-22 DIAGNOSIS — E1165 Type 2 diabetes mellitus with hyperglycemia: Secondary | ICD-10-CM | POA: Diagnosis not present

## 2019-10-22 DIAGNOSIS — G20 Parkinson's disease: Secondary | ICD-10-CM | POA: Diagnosis not present

## 2019-10-22 DIAGNOSIS — J449 Chronic obstructive pulmonary disease, unspecified: Secondary | ICD-10-CM | POA: Diagnosis not present

## 2019-10-22 DIAGNOSIS — E1140 Type 2 diabetes mellitus with diabetic neuropathy, unspecified: Secondary | ICD-10-CM | POA: Diagnosis not present

## 2019-10-22 DIAGNOSIS — G40909 Epilepsy, unspecified, not intractable, without status epilepticus: Secondary | ICD-10-CM | POA: Diagnosis not present

## 2019-10-22 DIAGNOSIS — I69398 Other sequelae of cerebral infarction: Secondary | ICD-10-CM | POA: Diagnosis not present

## 2019-10-23 DIAGNOSIS — I69398 Other sequelae of cerebral infarction: Secondary | ICD-10-CM | POA: Diagnosis not present

## 2019-10-23 DIAGNOSIS — E1165 Type 2 diabetes mellitus with hyperglycemia: Secondary | ICD-10-CM | POA: Diagnosis not present

## 2019-10-23 DIAGNOSIS — G40909 Epilepsy, unspecified, not intractable, without status epilepticus: Secondary | ICD-10-CM | POA: Diagnosis not present

## 2019-10-23 DIAGNOSIS — G20 Parkinson's disease: Secondary | ICD-10-CM | POA: Diagnosis not present

## 2019-10-23 DIAGNOSIS — E1140 Type 2 diabetes mellitus with diabetic neuropathy, unspecified: Secondary | ICD-10-CM | POA: Diagnosis not present

## 2019-10-23 DIAGNOSIS — J449 Chronic obstructive pulmonary disease, unspecified: Secondary | ICD-10-CM | POA: Diagnosis not present

## 2019-10-26 ENCOUNTER — Ambulatory Visit (INDEPENDENT_AMBULATORY_CARE_PROVIDER_SITE_OTHER): Payer: Medicare Other | Admitting: Podiatry

## 2019-10-26 ENCOUNTER — Other Ambulatory Visit: Payer: Self-pay

## 2019-10-26 ENCOUNTER — Encounter: Payer: Self-pay | Admitting: Podiatry

## 2019-10-26 ENCOUNTER — Telehealth: Payer: Self-pay

## 2019-10-26 DIAGNOSIS — L84 Corns and callosities: Secondary | ICD-10-CM

## 2019-10-26 DIAGNOSIS — I739 Peripheral vascular disease, unspecified: Secondary | ICD-10-CM

## 2019-10-26 NOTE — Patient Instructions (Signed)
Look at using Eucerin or Cetaphil moisturizer for the feet but do not apply between the toes.

## 2019-10-26 NOTE — Progress Notes (Signed)
Subjective: 69 year old female presents the office today for concerns of a painful callus the back of her left heel she also feels that the diabetic shoes the inserts are causing irritation.  She denies any other skin breakdown.  She does have dry skin and her caregiver is asking about which moisturizer to use.  She gets pain in the back of her heels with pressure in shoes and she cannot keep the heel pillows on. Denies any systemic complaints such as fevers, chills, nausea, vomiting. No acute changes since last appointment, and no other complaints at this time.   Objective: AAO x3, NAD DP/PT pulses palpable bilaterally, CRT less than 3 seconds Hyperkeratotic tissue left posterior heel.  Upon debridement no underlying ulceration drainage or any signs of infection.  Dry skin is present with any skin fissures or opening otherwise.  No other open sores. No pain with calf compression, swelling, warmth, erythema  Assessment: Preulcerative callus left heel  Plan: -All treatment options discussed with the patient including all alternatives, risks, complications.  -Debrided the hyperkeratotic tissue without any complications or bleeding today.  Recommend moisturizer daily.  Discussed Eucerin or Cetaphil but not interdigitally.  Also dispensed Allevyn heel cups that she can wear at night.  I will have her follow-up with Raiford Noble for modifications diabetic inserts. -Patient encouraged to call the office with any questions, concerns, change in symptoms.   Vivi Barrack DPM

## 2019-10-26 NOTE — Telephone Encounter (Signed)
Okay. Thank you.

## 2019-10-26 NOTE — Telephone Encounter (Signed)
New message   Verbal orders   home health OT for 1 week x 2 weeks &  2 times a week for 6 weeks.

## 2019-10-27 ENCOUNTER — Telehealth: Payer: Self-pay

## 2019-10-27 DIAGNOSIS — E1140 Type 2 diabetes mellitus with diabetic neuropathy, unspecified: Secondary | ICD-10-CM | POA: Diagnosis not present

## 2019-10-27 DIAGNOSIS — I69398 Other sequelae of cerebral infarction: Secondary | ICD-10-CM | POA: Diagnosis not present

## 2019-10-27 DIAGNOSIS — J449 Chronic obstructive pulmonary disease, unspecified: Secondary | ICD-10-CM | POA: Diagnosis not present

## 2019-10-27 DIAGNOSIS — G40909 Epilepsy, unspecified, not intractable, without status epilepticus: Secondary | ICD-10-CM | POA: Diagnosis not present

## 2019-10-27 DIAGNOSIS — E1165 Type 2 diabetes mellitus with hyperglycemia: Secondary | ICD-10-CM | POA: Diagnosis not present

## 2019-10-27 DIAGNOSIS — G20 Parkinson's disease: Secondary | ICD-10-CM | POA: Diagnosis not present

## 2019-10-27 NOTE — Telephone Encounter (Signed)
F/u   Darl Pikes calling back to speak with the nurse.

## 2019-10-27 NOTE — Telephone Encounter (Signed)
Called susan back she just needed to confirm my name from this morning orders.Marland KitchenRaechel Chute

## 2019-10-27 NOTE — Telephone Encounter (Signed)
Called [name] there was no answer LMOM w/MD response../lmb 

## 2019-10-29 DIAGNOSIS — G20 Parkinson's disease: Secondary | ICD-10-CM | POA: Diagnosis not present

## 2019-10-29 DIAGNOSIS — J449 Chronic obstructive pulmonary disease, unspecified: Secondary | ICD-10-CM | POA: Diagnosis not present

## 2019-10-29 DIAGNOSIS — E1140 Type 2 diabetes mellitus with diabetic neuropathy, unspecified: Secondary | ICD-10-CM | POA: Diagnosis not present

## 2019-10-29 DIAGNOSIS — E1165 Type 2 diabetes mellitus with hyperglycemia: Secondary | ICD-10-CM | POA: Diagnosis not present

## 2019-10-29 DIAGNOSIS — G40909 Epilepsy, unspecified, not intractable, without status epilepticus: Secondary | ICD-10-CM | POA: Diagnosis not present

## 2019-10-29 DIAGNOSIS — I69398 Other sequelae of cerebral infarction: Secondary | ICD-10-CM | POA: Diagnosis not present

## 2019-11-02 ENCOUNTER — Ambulatory Visit: Payer: Medicare Other | Admitting: Orthotics

## 2019-11-02 ENCOUNTER — Other Ambulatory Visit: Payer: Self-pay

## 2019-11-02 DIAGNOSIS — R0989 Other specified symptoms and signs involving the circulatory and respiratory systems: Secondary | ICD-10-CM

## 2019-11-02 DIAGNOSIS — L84 Corns and callosities: Secondary | ICD-10-CM

## 2019-11-02 DIAGNOSIS — L97421 Non-pressure chronic ulcer of left heel and midfoot limited to breakdown of skin: Secondary | ICD-10-CM

## 2019-11-02 DIAGNOSIS — I739 Peripheral vascular disease, unspecified: Secondary | ICD-10-CM

## 2019-11-02 NOTE — Progress Notes (Signed)
Trimmed material off bottom of f/o to make fit better in shoes

## 2019-11-03 DIAGNOSIS — J449 Chronic obstructive pulmonary disease, unspecified: Secondary | ICD-10-CM | POA: Diagnosis not present

## 2019-11-03 DIAGNOSIS — E1140 Type 2 diabetes mellitus with diabetic neuropathy, unspecified: Secondary | ICD-10-CM | POA: Diagnosis not present

## 2019-11-03 DIAGNOSIS — E1165 Type 2 diabetes mellitus with hyperglycemia: Secondary | ICD-10-CM | POA: Diagnosis not present

## 2019-11-03 DIAGNOSIS — G20 Parkinson's disease: Secondary | ICD-10-CM | POA: Diagnosis not present

## 2019-11-03 DIAGNOSIS — G40909 Epilepsy, unspecified, not intractable, without status epilepticus: Secondary | ICD-10-CM | POA: Diagnosis not present

## 2019-11-03 DIAGNOSIS — I69398 Other sequelae of cerebral infarction: Secondary | ICD-10-CM | POA: Diagnosis not present

## 2019-11-05 DIAGNOSIS — I69398 Other sequelae of cerebral infarction: Secondary | ICD-10-CM | POA: Diagnosis not present

## 2019-11-05 DIAGNOSIS — J449 Chronic obstructive pulmonary disease, unspecified: Secondary | ICD-10-CM | POA: Diagnosis not present

## 2019-11-05 DIAGNOSIS — G40909 Epilepsy, unspecified, not intractable, without status epilepticus: Secondary | ICD-10-CM | POA: Diagnosis not present

## 2019-11-05 DIAGNOSIS — G20 Parkinson's disease: Secondary | ICD-10-CM | POA: Diagnosis not present

## 2019-11-05 DIAGNOSIS — E1140 Type 2 diabetes mellitus with diabetic neuropathy, unspecified: Secondary | ICD-10-CM | POA: Diagnosis not present

## 2019-11-05 DIAGNOSIS — E1165 Type 2 diabetes mellitus with hyperglycemia: Secondary | ICD-10-CM | POA: Diagnosis not present

## 2019-11-06 DIAGNOSIS — G40909 Epilepsy, unspecified, not intractable, without status epilepticus: Secondary | ICD-10-CM | POA: Diagnosis not present

## 2019-11-06 DIAGNOSIS — E1140 Type 2 diabetes mellitus with diabetic neuropathy, unspecified: Secondary | ICD-10-CM | POA: Diagnosis not present

## 2019-11-06 DIAGNOSIS — G20 Parkinson's disease: Secondary | ICD-10-CM | POA: Diagnosis not present

## 2019-11-06 DIAGNOSIS — E1165 Type 2 diabetes mellitus with hyperglycemia: Secondary | ICD-10-CM | POA: Diagnosis not present

## 2019-11-06 DIAGNOSIS — I69398 Other sequelae of cerebral infarction: Secondary | ICD-10-CM | POA: Diagnosis not present

## 2019-11-06 DIAGNOSIS — J449 Chronic obstructive pulmonary disease, unspecified: Secondary | ICD-10-CM | POA: Diagnosis not present

## 2019-11-09 ENCOUNTER — Other Ambulatory Visit: Payer: Self-pay

## 2019-11-09 ENCOUNTER — Encounter: Payer: Self-pay | Admitting: Internal Medicine

## 2019-11-09 ENCOUNTER — Ambulatory Visit (INDEPENDENT_AMBULATORY_CARE_PROVIDER_SITE_OTHER): Payer: Medicare Other | Admitting: Internal Medicine

## 2019-11-09 DIAGNOSIS — E1100 Type 2 diabetes mellitus with hyperosmolarity without nonketotic hyperglycemic-hyperosmolar coma (NKHHC): Secondary | ICD-10-CM

## 2019-11-09 DIAGNOSIS — R531 Weakness: Secondary | ICD-10-CM

## 2019-11-09 DIAGNOSIS — R5381 Other malaise: Secondary | ICD-10-CM | POA: Diagnosis not present

## 2019-11-09 DIAGNOSIS — E538 Deficiency of other specified B group vitamins: Secondary | ICD-10-CM | POA: Diagnosis not present

## 2019-11-09 LAB — HEPATIC FUNCTION PANEL
ALT: 4 U/L (ref 0–35)
AST: 14 U/L (ref 0–37)
Albumin: 4.1 g/dL (ref 3.5–5.2)
Alkaline Phosphatase: 98 U/L (ref 39–117)
Bilirubin, Direct: 0.1 mg/dL (ref 0.0–0.3)
Total Bilirubin: 0.6 mg/dL (ref 0.2–1.2)
Total Protein: 6.9 g/dL (ref 6.0–8.3)

## 2019-11-09 LAB — BASIC METABOLIC PANEL
BUN: 13 mg/dL (ref 6–23)
CO2: 27 mEq/L (ref 19–32)
Calcium: 9.4 mg/dL (ref 8.4–10.5)
Chloride: 100 mEq/L (ref 96–112)
Creatinine, Ser: 0.69 mg/dL (ref 0.40–1.20)
GFR: 84.31 mL/min (ref >60.00)
Glucose, Bld: 202 mg/dL — ABNORMAL HIGH (ref 70–99)
Potassium: 4.4 mEq/L (ref 3.5–5.1)
Sodium: 137 mEq/L (ref 135–145)

## 2019-11-09 LAB — HEMOGLOBIN A1C: Hgb A1c MFr Bld: 8.7 % — ABNORMAL HIGH (ref 4.6–6.5)

## 2019-11-09 LAB — VITAMIN B12: Vitamin B-12: 225 pg/mL (ref 211–911)

## 2019-11-09 MED ORDER — ONETOUCH VERIO VI STRP: ORAL_STRIP | 11 refills | Status: AC

## 2019-11-09 MED ORDER — ONETOUCH VERIO W/DEVICE KIT: 1.0000 [IU] | PACK | Freq: Every day | 1 refills | Status: AC | PRN

## 2019-11-09 MED ORDER — ONETOUCH DELICA LANCETS 33G MISC: 11 refills | Status: AC

## 2019-11-09 NOTE — Assessment & Plan Note (Signed)
Prandin 

## 2019-11-09 NOTE — Assessment & Plan Note (Signed)
In OT at home 

## 2019-11-09 NOTE — Progress Notes (Signed)
Subjective:  Patient ID: [name], female    DOB: Mar 09, 1951  Age: 69 y.o. MRN: [id number]  CC: No chief complaint on file.   HPI [name] presents for neck and shoulder pain, DM, HTN f/iu  Outpatient Medications Prior to Visit  Medication Sig Dispense Refill  . albuterol (VENTOLIN HFA) 108 (90 Base) MCG/ACT inhaler Inhale 1-2 puffs into the lungs every 4 (four) hours as needed for wheezing or shortness of breath. 56 g 3  . aspirin 81 MG EC tablet Take 81 mg by mouth daily.      . carbidopa-levodopa (SINEMET IR) 25-250 MG tablet Take 1 tablet by mouth 3 (three) times daily. Patient needs office visit before refills will be given 270 tablet 3  . Cholecalciferol (VITAMIN D3) 50 MCG (2000 UT) capsule Take 1 capsule (2,000 Units total) by mouth daily. 100 capsule 3  . gabapentin (NEURONTIN) 100 MG capsule Take 200 mg by mouth 3 (three) times daily.   2  . glucose blood (ONETOUCH VERIO) test strip Use to check blood sugars twice a day. Dx E11.9 100 each 5  . HYDROmorphone (DILAUDID) 2 MG tablet Take 2-3 tablets (4-6 mg total) by mouth every 6 (six) hours as needed for moderate pain or severe pain. (Patient taking differently: Take 2 mg by mouth every 12 (twelve) hours. ) 75 tablet 0  . LORazepam (ATIVAN) 2 MG tablet Take 1 tablet (2 mg total) by mouth every 8 (eight) hours as needed for anxiety. Office visit needed before refills will be given 270 tablet 1  . metFORMIN (GLUCOPHAGE-XR) 750 MG 24 hr tablet Take 1 tablet (750 mg total) by mouth 2 (two) times daily. 180 tablet 3  . metoprolol tartrate (LOPRESSOR) 25 MG tablet TAKE ONE-HALF TABLET (12.5 MG TOTAL) BY MOUTH 2 (TWO) TIMES DAILY 90 tablet 3  . mirtazapine (REMERON) 15 MG tablet TAKE 1 TABLET BY MOUTH AT BEDTIME AT 6-8 PM 90 tablet 3  . multivitamin-lutein (OCUVITE-LUTEIN) CAPS capsule Take 1 capsule by mouth daily.    Letta Pate DELICA LANCETS 33G MISC Use to help check blood sugars twice a day. Dx E11.9 100 each 3  .  repaglinide (PRANDIN) 2 MG tablet Take 1 tablet (2 mg total) by mouth 3 (three) times daily before meals. 270 tablet 3  . tamsulosin (FLOMAX) 0.4 MG CAPS capsule Take 1 capsule (0.4 mg total) by mouth daily. 90 capsule 3   No facility-administered medications prior to visit.    ROS: Review of Systems  Constitutional: Positive for fatigue. Negative for activity change, appetite change, chills and unexpected weight change.  HENT: Negative for congestion, mouth sores and sinus pressure.   Eyes: Negative for visual disturbance.  Respiratory: Negative for cough and chest tightness.   Gastrointestinal: Negative for abdominal pain and nausea.  Genitourinary: Negative for difficulty urinating, frequency and vaginal pain.  Musculoskeletal: Positive for arthralgias, back pain and gait problem.  Skin: Negative for pallor and rash.  Neurological: Negative for dizziness, tremors, weakness, numbness and headaches.  Psychiatric/Behavioral: Negative for confusion and sleep disturbance. The patient is nervous/anxious.     Objective:  There were no vitals taken for this visit.  BP Readings from Last 3 Encounters:  08/06/19 124/68  07/28/19 117/63  07/18/19 137/74    Wt Readings from Last 3 Encounters:  07/28/19 158 lb 6.4 oz (71.8 kg)  07/16/19 165 lb (74.8 kg)  06/24/19 161 lb (73 kg)    Physical Exam Constitutional:  General: She is not in acute distress.    Appearance: She is well-developed.  HENT:     Head: Normocephalic.     Right Ear: External ear normal.     Left Ear: External ear normal.     Nose: Nose normal.  Eyes:     General:        Right eye: No discharge.        Left eye: No discharge.     Conjunctiva/sclera: Conjunctivae normal.     Pupils: Pupils are equal, round, and reactive to light.  Neck:     Thyroid: No thyromegaly.     Vascular: No JVD.     Trachea: No tracheal deviation.  Cardiovascular:     Rate and Rhythm: Normal rate and regular rhythm.     Heart  sounds: Normal heart sounds.  Pulmonary:     Effort: No respiratory distress.     Breath sounds: No stridor. No wheezing.  Abdominal:     General: Bowel sounds are normal. There is no distension.     Palpations: Abdomen is soft. There is no mass.     Tenderness: There is no abdominal tenderness. There is no guarding or rebound.  Musculoskeletal:        General: Tenderness present.     Cervical back: Normal range of motion and neck supple.  Lymphadenopathy:     Cervical: No cervical adenopathy.  Skin:    Findings: No erythema or rash.  Neurological:     Cranial Nerves: No cranial nerve deficit.     Motor: No abnormal muscle tone.     Coordination: Coordination normal.     Deep Tendon Reflexes: Reflexes normal.  Psychiatric:        Behavior: Behavior normal.        Thought Content: Thought content normal.        Judgment: Judgment normal.   in a w/c  Lab Results  Component Value Date   WBC 5.5 07/17/2019   HGB 12.6 07/17/2019   HCT 42.6 07/17/2019   PLT 172 07/17/2019   GLUCOSE 189 (H) 07/17/2019   CHOL 213 (H) 04/16/2017   TRIG 183.0 (H) 04/16/2017   HDL 93.40 04/16/2017   LDLDIRECT 100.8 10/26/2013   LDLCALC 83 04/16/2017   ALT 13 07/16/2019   AST 18 07/16/2019   NA 136 07/17/2019   K 4.6 07/17/2019   CL 99 07/17/2019   CREATININE 0.68 07/17/2019   BUN 13 07/17/2019   CO2 26 07/17/2019   TSH 1.92 03/20/2018   INR 1.12 11/08/2016   HGBA1C 7.1 (H) 07/17/2019    DG Chest 2 View  Result Date: 07/16/2019 CLINICAL DATA:  Shortness of breath EXAM: CHEST - 2 VIEW COMPARISON:  08/19/2018 FINDINGS: Normal heart size and mediastinal contours. No acute infiltrate or edema. No effusion or pneumothorax. No acute osseous findings. Remote left humerus fracture repair. Postoperative left upper quadrant. IMPRESSION: No evidence of active disease Electronically Signed   By: Marnee Spring M.D.   On: 07/16/2019 04:06   ECHOCARDIOGRAM COMPLETE  Result Date: 07/17/2019    ECHOCARDIOGRAM REPORT   Patient Name:   [name] Date of Exam: 07/17/2019 Medical Rec #:  [id number]    Height:       65.0 in Accession #:    [id number]   Weight:       165.0 lb Date of Birth:  Jul 16, 1951   BSA:          1.82 m Patient Age:  67 years     BP:           137/68 mmHg Patient Gender: F            HR:           69 bpm. Exam Location:  Inpatient Procedure: 2D Echo, Color Doppler and Cardiac Doppler Indications:    R06.9 DOE  History:        Patient has prior history of Echocardiogram examinations, most                 recent 07/19/2016. COPD Risk Factors:Diabetes and Dyslipidemia.  Sonographer:    Irving Burton Senior RDCS Referring Phys: [id number] Warrick Parisian J EZENDUKA IMPRESSIONS  1. Left ventricular ejection fraction, by visual estimation, is 55 to 60%. The left ventricle has normal function. There is no left ventricular hypertrophy.  2. Left ventricular diastolic Doppler parameters are consistent with pseudonormalization pattern of LV diastolic filling.  3. Global right ventricle has normal systolic function.The right ventricular size is normal. No increase in right ventricular wall thickness.  4. Left atrial size was normal.  5. Right atrial size was normal.  6. The mitral valve is normal in structure. Trace mitral valve regurgitation.  7. The tricuspid valve is normal in structure. Tricuspid valve regurgitation was not visualized by color flow Doppler.  8. The aortic valve is normal in structure. Aortic valve regurgitation was not visualized by color flow Doppler. Structurally normal aortic valve, with no evidence of sclerosis or stenosis.  9. The pulmonic valve was normal in structure. Pulmonic valve regurgitation is not visualized by color flow Doppler. 10. Mildly elevated pulmonary artery systolic pressure. 11. The atrial septum is grossly normal. FINDINGS  Left Ventricle: Left ventricular ejection fraction, by visual estimation, is 55 to 60%. The left ventricle has normal function. There is no left  ventricular hypertrophy. Spectral Doppler shows Left ventricular diastolic Doppler parameters are consistent  with pseudonormalization pattern of LV diastolic filling. Right Ventricle: The right ventricular size is normal. No increase in right ventricular wall thickness. Global RV systolic function is has normal systolic function. The tricuspid regurgitant velocity is 2.93 m/s, and with an assumed right atrial pressure  of 3 mmHg, the estimated right ventricular systolic pressure is mildly elevated at 37.3 mmHg. Left Atrium: Left atrial size was normal in size. Right Atrium: Right atrial size was normal in size Pericardium: There is no evidence of pericardial effusion. Mitral Valve: The mitral valve is normal in structure. Trace mitral valve regurgitation. Tricuspid Valve: The tricuspid valve is normal in structure. Tricuspid valve regurgitation was not visualized by color flow Doppler. Aortic Valve: The aortic valve is normal in structure. Aortic valve regurgitation was not visualized by color flow Doppler. The aortic valve is structurally normal, with no evidence of sclerosis or stenosis. Pulmonic Valve: The pulmonic valve was normal in structure. Pulmonic valve regurgitation is not visualized by color flow Doppler. Aorta: The aortic root and ascending aorta are structurally normal, with no evidence of dilitation. IAS/Shunts: The atrial septum is grossly normal.  LEFT VENTRICLE PLAX 2D LVIDd:         3.90 cm       Diastology LVIDs:         2.80 cm       LV e' lateral:   8.59 cm/s LV PW:         0.90 cm       LV E/e' lateral: 6.8 LV IVS:        0.90  cm       LV e' medial:    5.00 cm/s LVOT diam:     2.00 cm       LV E/e' medial:  11.7 LV SV:         36 ml LV SV Index:   19.44 LVOT Area:     3.14 cm  LV Volumes (MOD) LV area d, A2C:    21.20 cm LV area d, A4C:    18.90 cm LV area s, A2C:    13.40 cm LV area s, A4C:    11.90 cm LV major d, A2C:   6.78 cm LV major d, A4C:   6.04 cm LV major s, A2C:   5.82 cm LV  major s, A4C:   5.28 cm LV vol d, MOD A2C: 55.5 ml LV vol d, MOD A4C: 49.0 ml LV vol s, MOD A2C: 27.6 ml LV vol s, MOD A4C: 22.8 ml LV SV MOD A2C:     27.9 ml LV SV MOD A4C:     49.0 ml LV SV MOD BP:      29.1 ml RIGHT VENTRICLE RV S prime:     9.68 cm/s TAPSE (M-mode): 1.8 cm LEFT ATRIUM             Index LA diam:        2.10 cm 1.15 cm/m LA Vol (A2C):   34.2 ml 18.76 ml/m LA Vol (A4C):   27.5 ml 15.09 ml/m LA Biplane Vol: 32.2 ml 17.66 ml/m  AORTIC VALVE LVOT Vmax:   67.90 cm/s LVOT Vmean:  46.400 cm/s LVOT VTI:    0.151 m  AORTA Ao Root diam: 3.10 cm Ao Asc diam:  3.20 cm MITRAL VALVE                        TRICUSPID VALVE MV Area (PHT): 4.17 cm             TR Peak grad:   34.3 mmHg MV PHT:        52.78 msec           TR Vmax:        293.00 cm/s MV Decel Time: 182 msec MV E velocity: 58.30 cm/s 103 cm/s  SHUNTS MV A velocity: 52.70 cm/s 70.3 cm/s Systemic VTI:  0.15 m MV E/A ratio:  1.11       1.5       Systemic Diam: 2.00 cm  Mertie Moores MD Electronically signed by Mertie Moores MD Signature Date/Time: 07/17/2019/11:37:13 AM    Final     Assessment & Plan:     Follow-up: No follow-ups on file.  Walker Kehr, MD

## 2019-11-09 NOTE — Assessment & Plan Note (Addendum)
On B12 in a MVI

## 2019-11-09 NOTE — Assessment & Plan Note (Addendum)
Chronic  Weakness - w/c In PT

## 2019-11-10 DIAGNOSIS — I69398 Other sequelae of cerebral infarction: Secondary | ICD-10-CM | POA: Diagnosis not present

## 2019-11-10 DIAGNOSIS — E1140 Type 2 diabetes mellitus with diabetic neuropathy, unspecified: Secondary | ICD-10-CM | POA: Diagnosis not present

## 2019-11-10 DIAGNOSIS — G20 Parkinson's disease: Secondary | ICD-10-CM | POA: Diagnosis not present

## 2019-11-10 DIAGNOSIS — G40909 Epilepsy, unspecified, not intractable, without status epilepticus: Secondary | ICD-10-CM | POA: Diagnosis not present

## 2019-11-10 DIAGNOSIS — J449 Chronic obstructive pulmonary disease, unspecified: Secondary | ICD-10-CM | POA: Diagnosis not present

## 2019-11-10 DIAGNOSIS — E1165 Type 2 diabetes mellitus with hyperglycemia: Secondary | ICD-10-CM | POA: Diagnosis not present

## 2019-11-11 DIAGNOSIS — G20 Parkinson's disease: Secondary | ICD-10-CM | POA: Diagnosis not present

## 2019-11-11 DIAGNOSIS — J449 Chronic obstructive pulmonary disease, unspecified: Secondary | ICD-10-CM | POA: Diagnosis not present

## 2019-11-11 DIAGNOSIS — E1140 Type 2 diabetes mellitus with diabetic neuropathy, unspecified: Secondary | ICD-10-CM | POA: Diagnosis not present

## 2019-11-11 DIAGNOSIS — E1165 Type 2 diabetes mellitus with hyperglycemia: Secondary | ICD-10-CM | POA: Diagnosis not present

## 2019-11-11 DIAGNOSIS — I69398 Other sequelae of cerebral infarction: Secondary | ICD-10-CM | POA: Diagnosis not present

## 2019-11-11 DIAGNOSIS — G40909 Epilepsy, unspecified, not intractable, without status epilepticus: Secondary | ICD-10-CM | POA: Diagnosis not present

## 2019-11-13 DIAGNOSIS — G20 Parkinson's disease: Secondary | ICD-10-CM | POA: Diagnosis not present

## 2019-11-13 DIAGNOSIS — G40909 Epilepsy, unspecified, not intractable, without status epilepticus: Secondary | ICD-10-CM | POA: Diagnosis not present

## 2019-11-13 DIAGNOSIS — E1165 Type 2 diabetes mellitus with hyperglycemia: Secondary | ICD-10-CM | POA: Diagnosis not present

## 2019-11-13 DIAGNOSIS — I69398 Other sequelae of cerebral infarction: Secondary | ICD-10-CM | POA: Diagnosis not present

## 2019-11-13 DIAGNOSIS — J449 Chronic obstructive pulmonary disease, unspecified: Secondary | ICD-10-CM | POA: Diagnosis not present

## 2019-11-13 DIAGNOSIS — E1140 Type 2 diabetes mellitus with diabetic neuropathy, unspecified: Secondary | ICD-10-CM | POA: Diagnosis not present

## 2019-12-30 DIAGNOSIS — G894 Chronic pain syndrome: Secondary | ICD-10-CM | POA: Diagnosis not present

## 2019-12-30 DIAGNOSIS — Z79891 Long term (current) use of opiate analgesic: Secondary | ICD-10-CM | POA: Diagnosis not present

## 2020-01-27 DIAGNOSIS — Z79891 Long term (current) use of opiate analgesic: Secondary | ICD-10-CM | POA: Diagnosis not present

## 2020-01-27 DIAGNOSIS — M47816 Spondylosis without myelopathy or radiculopathy, lumbar region: Secondary | ICD-10-CM | POA: Diagnosis not present

## 2020-01-27 DIAGNOSIS — G894 Chronic pain syndrome: Secondary | ICD-10-CM | POA: Diagnosis not present

## 2020-01-27 DIAGNOSIS — M25572 Pain in left ankle and joints of left foot: Secondary | ICD-10-CM | POA: Diagnosis not present

## 2020-02-04 ENCOUNTER — Other Ambulatory Visit: Payer: Self-pay | Admitting: Internal Medicine

## 2020-02-05 NOTE — Telephone Encounter (Signed)
Please advise about refill in Dr. Plotnikovs absence. 

## 2020-02-24 DIAGNOSIS — Z79891 Long term (current) use of opiate analgesic: Secondary | ICD-10-CM | POA: Diagnosis not present

## 2020-02-24 DIAGNOSIS — G894 Chronic pain syndrome: Secondary | ICD-10-CM | POA: Diagnosis not present

## 2020-02-24 DIAGNOSIS — M47816 Spondylosis without myelopathy or radiculopathy, lumbar region: Secondary | ICD-10-CM | POA: Diagnosis not present

## 2020-03-10 ENCOUNTER — Other Ambulatory Visit: Payer: Self-pay | Admitting: Family

## 2020-03-10 NOTE — Telephone Encounter (Signed)
[place] Controlled [place] Checked Last filled: 02/12/2020 90 LOV w/you: 11/09/2019 Next appt w/you: none

## 2020-03-16 ENCOUNTER — Other Ambulatory Visit: Payer: Self-pay

## 2020-03-16 ENCOUNTER — Ambulatory Visit (INDEPENDENT_AMBULATORY_CARE_PROVIDER_SITE_OTHER): Payer: Medicare Other | Admitting: Internal Medicine

## 2020-03-16 ENCOUNTER — Encounter: Payer: Self-pay | Admitting: Internal Medicine

## 2020-03-16 DIAGNOSIS — J449 Chronic obstructive pulmonary disease, unspecified: Secondary | ICD-10-CM

## 2020-03-16 DIAGNOSIS — E538 Deficiency of other specified B group vitamins: Secondary | ICD-10-CM | POA: Diagnosis not present

## 2020-03-16 DIAGNOSIS — F418 Other specified anxiety disorders: Secondary | ICD-10-CM | POA: Diagnosis not present

## 2020-03-16 DIAGNOSIS — E1100 Type 2 diabetes mellitus with hyperosmolarity without nonketotic hyperglycemic-hyperosmolar coma (NKHHC): Secondary | ICD-10-CM | POA: Diagnosis not present

## 2020-03-16 LAB — BASIC METABOLIC PANEL
BUN: 8 mg/dL (ref 6–23)
CO2: 31 mEq/L (ref 19–32)
Calcium: 9.3 mg/dL (ref 8.4–10.5)
Chloride: 97 mEq/L (ref 96–112)
Creatinine, Ser: 0.65 mg/dL (ref 0.40–1.20)
GFR: 90.23 mL/min (ref >60.00)
Glucose, Bld: 229 mg/dL — ABNORMAL HIGH (ref 70–99)
Potassium: 4.2 mEq/L (ref 3.5–5.1)
Sodium: 136 mEq/L (ref 135–145)

## 2020-03-16 MED ORDER — METOPROLOL TARTRATE 25 MG PO TABS: ORAL_TABLET | ORAL | 3 refills | Status: DC

## 2020-03-16 MED ORDER — REPAGLINIDE 2 MG PO TABS: 2.0000 mg | ORAL_TABLET | Freq: Three times a day (TID) | ORAL | 3 refills | Status: DC

## 2020-03-16 MED ORDER — TAMSULOSIN HCL 0.4 MG PO CAPS: 0.4000 mg | ORAL_CAPSULE | Freq: Every day | ORAL | 3 refills | Status: DC

## 2020-03-16 MED ORDER — LORAZEPAM 2 MG PO TABS: ORAL_TABLET | ORAL | 1 refills | Status: DC

## 2020-03-16 MED ORDER — MIRTAZAPINE 15 MG PO TABS: ORAL_TABLET | ORAL | 3 refills | Status: DC

## 2020-03-16 MED ORDER — CARBIDOPA-LEVODOPA 25-250 MG PO TABS: 1.0000 | ORAL_TABLET | Freq: Three times a day (TID) | ORAL | 3 refills | Status: DC

## 2020-03-16 NOTE — Patient Instructions (Signed)
Feb 2021 labs  [name], Your labs/tests are good, except for a borderline low vitamin B-12. Have you been taking your vitamin B complex? Your diabetic test hemoglobin A1c is elevated. Are you taking your Metformin and repaglinide? Sincerely, Jacinta Shoe, MD

## 2020-03-16 NOTE — Assessment & Plan Note (Signed)
On B12 

## 2020-03-16 NOTE — Assessment & Plan Note (Signed)
On Trelegy °

## 2020-03-16 NOTE — Assessment & Plan Note (Signed)
Lorazepam prn  Potential benefits of a long term benzodiazepines  use as well as potential risks  and complications were explained to the patient and were aknowledged.  

## 2020-03-16 NOTE — Assessment & Plan Note (Addendum)
Off Metformin - pt stropped Metformin after getting a letter  Prandin ?Jardiance - not taking

## 2020-03-16 NOTE — Progress Notes (Signed)
Subjective:  Patient ID: [name], female    DOB: 01/11/1951  Age: 69 y.o. MRN: [id number]  CC: No chief complaint on file.   HPI [name] presents for anxiety, DM, COPD f/u [name] does not know wha pills she is taking - Greg sets them up daily  Outpatient Medications Prior to Visit  Medication Sig Dispense Refill  . albuterol (VENTOLIN HFA) 108 (90 Base) MCG/ACT inhaler Inhale 1-2 puffs into the lungs every 4 (four) hours as needed for wheezing or shortness of breath. 56 g 3  . aspirin 81 MG EC tablet Take 81 mg by mouth daily.      . Blood Glucose Monitoring Suppl (ONETOUCH VERIO) w/Device KIT 1 Units by Does not apply route daily as needed. 1 kit 1  . carbidopa-levodopa (SINEMET IR) 25-250 MG tablet Take 1 tablet by mouth 3 (three) times daily. Patient needs office visit before refills will be given 270 tablet 3  . Cholecalciferol (VITAMIN D3) 50 MCG (2000 UT) capsule Take 1 capsule (2,000 Units total) by mouth daily. 100 capsule 3  . gabapentin (NEURONTIN) 100 MG capsule Take 200 mg by mouth 3 (three) times daily.   2  . glucose blood (ONETOUCH VERIO) test strip Use to check blood sugars twice a day. Dx E11.9 50 each 11  . HYDROmorphone (DILAUDID) 2 MG tablet Take 2-3 tablets (4-6 mg total) by mouth every 6 (six) hours as needed for moderate pain or severe pain. (Patient taking differently: Take 2 mg by mouth every 12 (twelve) hours. ) 75 tablet 0  . LORazepam (ATIVAN) 2 MG tablet TAKE 1 TABLET EVERY 8 HOURS AS NEEDED FOR ANXIETY. 90 tablet 1  . metFORMIN (GLUCOPHAGE-XR) 750 MG 24 hr tablet Take 1 tablet (750 mg total) by mouth 2 (two) times daily. 180 tablet 3  . metoprolol tartrate (LOPRESSOR) 25 MG tablet TAKE ONE-HALF TABLET (12.5 MG TOTAL) BY MOUTH 2 (TWO) TIMES DAILY 90 tablet 3  . mirtazapine (REMERON) 15 MG tablet TAKE 1 TABLET BY MOUTH AT BEDTIME AT 6-8 PM 90 tablet 3  . multivitamin-lutein (OCUVITE-LUTEIN) CAPS capsule Take 1 capsule by mouth daily.    [name]  Delica Lancets 77L MISC Use to help check blood sugars once a day. Dx E11.9 50 each 11  . repaglinide (PRANDIN) 2 MG tablet Take 1 tablet (2 mg total) by mouth 3 (three) times daily before meals. 270 tablet 3  . tamsulosin (FLOMAX) 0.4 MG CAPS capsule Take 1 capsule (0.4 mg total) by mouth daily. 90 capsule 3   No facility-administered medications prior to visit.    ROS: Review of Systems  Constitutional: Positive for fatigue and unexpected weight change. Negative for activity change, appetite change and chills.  HENT: Negative for congestion, mouth sores and sinus pressure.   Eyes: Negative for visual disturbance.  Respiratory: Negative for cough and chest tightness.   Gastrointestinal: Negative for abdominal pain and nausea.  Genitourinary: Negative for difficulty urinating, frequency and vaginal pain.  Musculoskeletal: Positive for arthralgias, gait problem and neck stiffness.  Skin: Negative for pallor and rash.  Neurological: Negative for dizziness, tremors, weakness, numbness and headaches.  Psychiatric/Behavioral: Positive for decreased concentration. Negative for confusion, self-injury, sleep disturbance and suicidal ideas. The patient is nervous/anxious.     Objective:  BP (!) 120/56 (BP Location: Right Arm, Patient Position: Sitting, Cuff Size: Normal)   Pulse 94   Temp 98.5 F (36.9 C) (Oral)   Ht '5\' 5"'  (1.651 m)   Wt  153 lb (69.4 kg)   SpO2 94%   BMI 25.46 kg/m   BP Readings from Last 3 Encounters:  03/16/20 (!) 120/56  11/09/19 126/74  08/06/19 124/68    Wt Readings from Last 3 Encounters:  03/16/20 153 lb (69.4 kg)  11/09/19 156 lb (70.8 kg)  07/28/19 158 lb 6.4 oz (71.8 kg)    Physical Exam Constitutional:      General: She is not in acute distress.    Appearance: She is well-developed.  HENT:     Head: Normocephalic.     Right Ear: External ear normal.     Left Ear: External ear normal.     Nose: Nose normal.  Eyes:     General:        Right eye:  No discharge.        Left eye: No discharge.     Conjunctiva/sclera: Conjunctivae normal.     Pupils: Pupils are equal, round, and reactive to light.  Neck:     Thyroid: No thyromegaly.     Vascular: No JVD.     Trachea: No tracheal deviation.  Cardiovascular:     Rate and Rhythm: Normal rate and regular rhythm.     Heart sounds: Normal heart sounds.  Pulmonary:     Effort: No respiratory distress.     Breath sounds: No stridor. No wheezing.  Abdominal:     General: Bowel sounds are normal. There is no distension.     Palpations: Abdomen is soft. There is no mass.     Tenderness: There is no abdominal tenderness. There is no guarding or rebound.  Musculoskeletal:        General: No tenderness.     Cervical back: Normal range of motion and neck supple.  Lymphadenopathy:     Cervical: No cervical adenopathy.  Skin:    Findings: No erythema or rash.  Neurological:     Cranial Nerves: No cranial nerve deficit.     Motor: Weakness present. No abnormal muscle tone.     Coordination: Coordination normal.     Gait: Gait abnormal.     Deep Tendon Reflexes: Reflexes normal.  Psychiatric:        Behavior: Behavior normal.        Thought Content: Thought content normal.        Judgment: Judgment normal.   in a w/c  Lab Results  Component Value Date   WBC 5.5 07/17/2019   HGB 12.6 07/17/2019   HCT 42.6 07/17/2019   PLT 172 07/17/2019   GLUCOSE 202 (H) 11/09/2019   CHOL 213 (H) 04/16/2017   TRIG 183.0 (H) 04/16/2017   HDL 93.40 04/16/2017   LDLDIRECT 100.8 10/26/2013   LDLCALC 83 04/16/2017   ALT 4 11/09/2019   AST 14 11/09/2019   NA 137 11/09/2019   K 4.4 11/09/2019   CL 100 11/09/2019   CREATININE 0.69 11/09/2019   BUN 13 11/09/2019   CO2 27 11/09/2019   TSH 1.92 03/20/2018   INR 1.12 11/08/2016   HGBA1C 8.7 (H) 11/09/2019    DG Chest 2 View  Result Date: 07/16/2019 CLINICAL DATA:  Shortness of breath EXAM: CHEST - 2 VIEW COMPARISON:  08/19/2018 FINDINGS: Normal  heart size and mediastinal contours. No acute infiltrate or edema. No effusion or pneumothorax. No acute osseous findings. Remote left humerus fracture repair. Postoperative left upper quadrant. IMPRESSION: No evidence of active disease Electronically Signed   By: Monte Fantasia M.D.   On: 07/16/2019 04:06  ECHOCARDIOGRAM COMPLETE  Result Date: 07/17/2019   ECHOCARDIOGRAM REPORT   Patient Name:   MESHIA RAU Date of Exam: 07/17/2019 Medical Rec #:  [id number]    Height:       65.0 in Accession #:    [id number]   Weight:       165.0 lb Date of Birth:  Aug 09, 1951   BSA:          1.82 m Patient Age:    7 years     BP:           137/68 mmHg Patient Gender: F            HR:           69 bpm. Exam Location:  Inpatient Procedure: 2D Echo, Color Doppler and Cardiac Doppler Indications:    R06.9 DOE  History:        Patient has prior history of Echocardiogram examinations, most                 recent 07/19/2016. COPD Risk Factors:Diabetes and Dyslipidemia.  Sonographer:    Raquel Sarna Senior RDCS Referring Phys: [id number] [place]  1. Left ventricular ejection fraction, by visual estimation, is 55 to 60%. The left ventricle has normal function. There is no left ventricular hypertrophy.  2. Left ventricular diastolic Doppler parameters are consistent with pseudonormalization pattern of LV diastolic filling.  3. Global right ventricle has normal systolic function.The right ventricular size is normal. No increase in right ventricular wall thickness.  4. Left atrial size was normal.  5. Right atrial size was normal.  6. The mitral valve is normal in structure. Trace mitral valve regurgitation.  7. The tricuspid valve is normal in structure. Tricuspid valve regurgitation was not visualized by color flow Doppler.  8. The aortic valve is normal in structure. Aortic valve regurgitation was not visualized by color flow Doppler. Structurally normal aortic valve, with no evidence of sclerosis or stenosis.  9.  The pulmonic valve was normal in structure. Pulmonic valve regurgitation is not visualized by color flow Doppler. 10. Mildly elevated pulmonary artery systolic pressure. 11. The atrial septum is grossly normal. FINDINGS  Left Ventricle: Left ventricular ejection fraction, by visual estimation, is 55 to 60%. The left ventricle has normal function. There is no left ventricular hypertrophy. Spectral Doppler shows Left ventricular diastolic Doppler parameters are consistent  with pseudonormalization pattern of LV diastolic filling. Right Ventricle: The right ventricular size is normal. No increase in right ventricular wall thickness. Global RV systolic function is has normal systolic function. The tricuspid regurgitant velocity is 2.93 m/s, and with an assumed right atrial pressure  of 3 mmHg, the estimated right ventricular systolic pressure is mildly elevated at 37.3 mmHg. Left Atrium: Left atrial size was normal in size. Right Atrium: Right atrial size was normal in size Pericardium: There is no evidence of pericardial effusion. Mitral Valve: The mitral valve is normal in structure. Trace mitral valve regurgitation. Tricuspid Valve: The tricuspid valve is normal in structure. Tricuspid valve regurgitation was not visualized by color flow Doppler. Aortic Valve: The aortic valve is normal in structure. Aortic valve regurgitation was not visualized by color flow Doppler. The aortic valve is structurally normal, with no evidence of sclerosis or stenosis. Pulmonic Valve: The pulmonic valve was normal in structure. Pulmonic valve regurgitation is not visualized by color flow Doppler. Aorta: The aortic root and ascending aorta are structurally normal, with no evidence of dilitation. IAS/Shunts: The atrial septum is  grossly normal.  LEFT VENTRICLE PLAX 2D LVIDd:         3.90 cm       Diastology LVIDs:         2.80 cm       LV e' lateral:   8.59 cm/s LV PW:         0.90 cm       LV E/e' lateral: 6.8 LV IVS:        0.90 cm        LV e' medial:    5.00 cm/s LVOT diam:     2.00 cm       LV E/e' medial:  11.7 LV SV:         36 ml LV SV Index:   19.44 LVOT Area:     3.14 cm  LV Volumes (MOD) LV area d, A2C:    21.20 cm LV area d, A4C:    18.90 cm LV area s, A2C:    13.40 cm LV area s, A4C:    11.90 cm LV major d, A2C:   6.78 cm LV major d, A4C:   6.04 cm LV major s, A2C:   5.82 cm LV major s, A4C:   5.28 cm LV vol d, MOD A2C: 55.5 ml LV vol d, MOD A4C: 49.0 ml LV vol s, MOD A2C: 27.6 ml LV vol s, MOD A4C: 22.8 ml LV SV MOD A2C:     27.9 ml LV SV MOD A4C:     49.0 ml LV SV MOD BP:      29.1 ml RIGHT VENTRICLE RV S prime:     9.68 cm/s TAPSE (M-mode): 1.8 cm LEFT ATRIUM             Index LA diam:        2.10 cm 1.15 cm/m LA Vol (A2C):   34.2 ml 18.76 ml/m LA Vol (A4C):   27.5 ml 15.09 ml/m LA Biplane Vol: 32.2 ml 17.66 ml/m  AORTIC VALVE LVOT Vmax:   67.90 cm/s LVOT Vmean:  46.400 cm/s LVOT VTI:    0.151 m  AORTA Ao Root diam: 3.10 cm Ao Asc diam:  3.20 cm MITRAL VALVE                        TRICUSPID VALVE MV Area (PHT): 4.17 cm             TR Peak grad:   34.3 mmHg MV PHT:        52.78 msec           TR Vmax:        293.00 cm/s MV Decel Time: 182 msec MV E velocity: 58.30 cm/s 103 cm/s  SHUNTS MV A velocity: 52.70 cm/s 70.3 cm/s Systemic VTI:  0.15 m MV E/A ratio:  1.11       1.5       Systemic Diam: 2.00 cm  Mertie Moores MD Electronically signed by Mertie Moores MD Signature Date/Time: 07/17/2019/11:37:13 AM    Final     Assessment & Plan:   There are no diagnoses linked to this encounter.   No orders of the defined types were placed in this encounter.    Follow-up: No follow-ups on file.  Walker Kehr, MD

## 2020-03-16 NOTE — Addendum Note (Signed)
Addended by: Miguel Aschoff on: 03/16/2020 03:46 PM   Modules accepted: Orders

## 2020-03-17 LAB — HEMOGLOBIN A1C: Hgb A1c MFr Bld: 9.5 % — ABNORMAL HIGH (ref 4.6–6.5)

## 2020-04-20 DIAGNOSIS — Z79891 Long term (current) use of opiate analgesic: Secondary | ICD-10-CM | POA: Diagnosis not present

## 2020-04-20 DIAGNOSIS — G894 Chronic pain syndrome: Secondary | ICD-10-CM | POA: Diagnosis not present

## 2020-04-20 DIAGNOSIS — M47816 Spondylosis without myelopathy or radiculopathy, lumbar region: Secondary | ICD-10-CM | POA: Diagnosis not present

## 2020-05-03 ENCOUNTER — Other Ambulatory Visit: Payer: Self-pay | Admitting: Internal Medicine

## 2020-05-03 NOTE — Telephone Encounter (Signed)
[place] Controlled Database Checked Last filled: 04/08/2020 (90) LOV w/you: 03/16/2020 Next appt w/you: none

## 2020-05-18 DIAGNOSIS — G894 Chronic pain syndrome: Secondary | ICD-10-CM | POA: Diagnosis not present

## 2020-05-18 DIAGNOSIS — M47816 Spondylosis without myelopathy or radiculopathy, lumbar region: Secondary | ICD-10-CM | POA: Diagnosis not present

## 2020-05-18 DIAGNOSIS — Z79891 Long term (current) use of opiate analgesic: Secondary | ICD-10-CM | POA: Diagnosis not present

## 2020-06-22 DIAGNOSIS — M47816 Spondylosis without myelopathy or radiculopathy, lumbar region: Secondary | ICD-10-CM | POA: Diagnosis not present

## 2020-06-22 DIAGNOSIS — Z79891 Long term (current) use of opiate analgesic: Secondary | ICD-10-CM | POA: Diagnosis not present

## 2020-06-22 DIAGNOSIS — G894 Chronic pain syndrome: Secondary | ICD-10-CM | POA: Diagnosis not present

## 2020-06-28 ENCOUNTER — Encounter: Payer: Self-pay | Admitting: Internal Medicine

## 2020-06-28 ENCOUNTER — Other Ambulatory Visit: Payer: Self-pay

## 2020-06-28 ENCOUNTER — Ambulatory Visit (INDEPENDENT_AMBULATORY_CARE_PROVIDER_SITE_OTHER): Payer: Medicare Other | Admitting: Internal Medicine

## 2020-06-28 VITALS — BP 130/80 | HR 82 | Temp 98.3°F | Ht 65.0 in | Wt 149.0 lb

## 2020-06-28 DIAGNOSIS — E1100 Type 2 diabetes mellitus with hyperosmolarity without nonketotic hyperglycemic-hyperosmolar coma (NKHHC): Secondary | ICD-10-CM | POA: Diagnosis not present

## 2020-06-28 DIAGNOSIS — Z23 Encounter for immunization: Secondary | ICD-10-CM

## 2020-06-28 DIAGNOSIS — E559 Vitamin D deficiency, unspecified: Secondary | ICD-10-CM

## 2020-06-28 DIAGNOSIS — G8929 Other chronic pain: Secondary | ICD-10-CM | POA: Diagnosis not present

## 2020-06-28 DIAGNOSIS — E538 Deficiency of other specified B group vitamins: Secondary | ICD-10-CM | POA: Diagnosis not present

## 2020-06-28 MED ORDER — LORAZEPAM 2 MG PO TABS: ORAL_TABLET | ORAL | 3 refills | Status: DC

## 2020-06-28 NOTE — Assessment & Plan Note (Signed)
Prandin, Metformin Labs 

## 2020-06-28 NOTE — Assessment & Plan Note (Signed)
On B12 

## 2020-06-28 NOTE — Assessment & Plan Note (Signed)
On Vit D 

## 2020-06-28 NOTE — Progress Notes (Signed)
Subjective:  Patient ID: [name], female    DOB: 06-10-1951  Age: 69 y.o. MRN: [id number]  CC: No chief complaint on file.   HPI [name] presents for chronic pain, depression, anxiety f/u  Outpatient Medications Prior to Visit  Medication Sig Dispense Refill  . albuterol (VENTOLIN HFA) 108 (90 Base) MCG/ACT inhaler Inhale 1-2 puffs into the lungs every 4 (four) hours as needed for wheezing or shortness of breath. 56 g 3  . aspirin 81 MG EC tablet Take 81 mg by mouth daily.      . Blood Glucose Monitoring Suppl (ONETOUCH VERIO) w/Device KIT 1 Units by Does not apply route daily as needed. 1 kit 1  . carbidopa-levodopa (SINEMET IR) 25-250 MG tablet Take 1 tablet by mouth 3 (three) times daily. Patient needs office visit before refills will be given 270 tablet 3  . Cholecalciferol (VITAMIN D3) 50 MCG (2000 UT) capsule Take 1 capsule (2,000 Units total) by mouth daily. 100 capsule 3  . glucose blood (ONETOUCH VERIO) test strip Use to check blood sugars twice a day. Dx E11.9 50 each 11  . HYDROmorphone (DILAUDID) 2 MG tablet Take 2-3 tablets (4-6 mg total) by mouth every 6 (six) hours as needed for moderate pain or severe pain. (Patient taking differently: Take 2 mg by mouth every 12 (twelve) hours. ) 75 tablet 0  . LORazepam (ATIVAN) 2 MG tablet TAKE 1 TABLET EVERY 8 HOURS AS NEEDED FOR ANXIETY. 90 tablet 1  . metFORMIN (GLUCOPHAGE-XR) 750 MG 24 hr tablet Take 1 tablet (750 mg total) by mouth 2 (two) times daily. 180 tablet 3  . metoprolol tartrate (LOPRESSOR) 25 MG tablet TAKE ONE-HALF TABLET (12.5 MG TOTAL) BY MOUTH 2 (TWO) TIMES DAILY 90 tablet 3  . mirtazapine (REMERON) 15 MG tablet TAKE 1 TABLET BY MOUTH AT BEDTIME AT 6-8 PM 90 tablet 3  . multivitamin-lutein (OCUVITE-LUTEIN) CAPS capsule Take 1 capsule by mouth daily.    [name] Delica Lancets 29W MISC Use to help check blood sugars once a day. Dx E11.9 50 each 11  . repaglinide (PRANDIN) 2 MG tablet Take 1 tablet (2 mg  total) by mouth 3 (three) times daily before meals. 270 tablet 3  . tamsulosin (FLOMAX) 0.4 MG CAPS capsule Take 1 capsule (0.4 mg total) by mouth daily. 90 capsule 3  . gabapentin (NEURONTIN) 100 MG capsule Take 200 mg by mouth 3 (three) times daily.  (Patient not taking: Reported on 06/28/2020)  2   No facility-administered medications prior to visit.    ROS: Review of Systems  Constitutional: Positive for fatigue. Negative for activity change, appetite change, chills and unexpected weight change.  HENT: Negative for congestion, mouth sores and sinus pressure.   Eyes: Negative for visual disturbance.  Respiratory: Negative for cough and chest tightness.   Gastrointestinal: Negative for abdominal pain and nausea.  Genitourinary: Negative for difficulty urinating, frequency and vaginal pain.  Musculoskeletal: Positive for arthralgias, gait problem and neck stiffness. Negative for back pain.  Skin: Negative for pallor and rash.  Neurological: Positive for weakness. Negative for dizziness, tremors, numbness and headaches.  Psychiatric/Behavioral: Negative for confusion and sleep disturbance. The patient is nervous/anxious.     Objective:  BP 130/80 (BP Location: Left Arm, Patient Position: Sitting, Cuff Size: Normal)   Pulse 82   Temp 98.3 F (36.8 C) (Oral)   Ht '5\' 5"'  (1.651 m)   Wt 149 lb (67.6 kg)   SpO2 95%   BMI  24.79 kg/m   BP Readings from Last 3 Encounters:  06/28/20 130/80  03/16/20 (!) 120/56  11/09/19 126/74    Wt Readings from Last 3 Encounters:  06/28/20 149 lb (67.6 kg)  03/16/20 153 lb (69.4 kg)  11/09/19 156 lb (70.8 kg)    Physical Exam Constitutional:      General: She is not in acute distress.    Appearance: She is well-developed.  HENT:     Head: Normocephalic.     Right Ear: External ear normal.     Left Ear: External ear normal.     Nose: Nose normal.  Eyes:     General:        Right eye: No discharge.        Left eye: No discharge.      Conjunctiva/sclera: Conjunctivae normal.     Pupils: Pupils are equal, round, and reactive to light.  Neck:     Thyroid: No thyromegaly.     Vascular: No JVD.     Trachea: No tracheal deviation.  Cardiovascular:     Rate and Rhythm: Normal rate and regular rhythm.     Heart sounds: Normal heart sounds.  Pulmonary:     Effort: No respiratory distress.     Breath sounds: No stridor. No wheezing.  Abdominal:     General: Bowel sounds are normal. There is no distension.     Palpations: Abdomen is soft. There is no mass.     Tenderness: There is no abdominal tenderness. There is no guarding or rebound.  Musculoskeletal:        General: No tenderness.     Cervical back: Normal range of motion and neck supple.  Lymphadenopathy:     Cervical: No cervical adenopathy.  Skin:    Findings: No erythema or rash.  Neurological:     Cranial Nerves: No cranial nerve deficit.     Motor: No abnormal muscle tone.     Coordination: Coordination normal.     Deep Tendon Reflexes: Reflexes normal.  Psychiatric:        Behavior: Behavior normal.        Thought Content: Thought content normal.        Judgment: Judgment normal.    in a w/c  Lab Results  Component Value Date   WBC 5.5 07/17/2019   HGB 12.6 07/17/2019   HCT 42.6 07/17/2019   PLT 172 07/17/2019   GLUCOSE 229 (H) 03/16/2020   CHOL 213 (H) 04/16/2017   TRIG 183.0 (H) 04/16/2017   HDL 93.40 04/16/2017   LDLDIRECT 100.8 10/26/2013   LDLCALC 83 04/16/2017   ALT 4 11/09/2019   AST 14 11/09/2019   NA 136 03/16/2020   K 4.2 03/16/2020   CL 97 03/16/2020   CREATININE 0.65 03/16/2020   BUN 8 03/16/2020   CO2 31 03/16/2020   TSH 1.92 03/20/2018   INR 1.12 11/08/2016   HGBA1C 9.5 (H) 03/16/2020    DG Chest 2 View  Result Date: 07/16/2019 CLINICAL DATA:  Shortness of breath EXAM: CHEST - 2 VIEW COMPARISON:  08/19/2018 FINDINGS: Normal heart size and mediastinal contours. No acute infiltrate or edema. No effusion or pneumothorax.  No acute osseous findings. Remote left humerus fracture repair. Postoperative left upper quadrant. IMPRESSION: No evidence of active disease Electronically Signed   By: Monte Fantasia M.D.   On: 07/16/2019 04:06   ECHOCARDIOGRAM COMPLETE  Result Date: 07/17/2019   ECHOCARDIOGRAM REPORT   Patient Name:   SIREEN HALK Date  of Exam: 07/17/2019 Medical Rec #:  [id number]    Height:       65.0 in Accession #:    [id number]   Weight:       165.0 lb Date of Birth:  Feb 24, 1951   BSA:          1.82 m Patient Age:    62 years     BP:           137/68 mmHg Patient Gender: F            HR:           69 bpm. Exam Location:  Inpatient Procedure: 2D Echo, Color Doppler and Cardiac Doppler Indications:    R06.9 DOE  History:        Patient has prior history of Echocardiogram examinations, most                 recent 07/19/2016. COPD Risk Factors:Diabetes and Dyslipidemia.  Sonographer:    Raquel Sarna Senior RDCS Referring Phys: [id number] [place]  1. Left ventricular ejection fraction, by visual estimation, is 55 to 60%. The left ventricle has normal function. There is no left ventricular hypertrophy.  2. Left ventricular diastolic Doppler parameters are consistent with pseudonormalization pattern of LV diastolic filling.  3. Global right ventricle has normal systolic function.The right ventricular size is normal. No increase in right ventricular wall thickness.  4. Left atrial size was normal.  5. Right atrial size was normal.  6. The mitral valve is normal in structure. Trace mitral valve regurgitation.  7. The tricuspid valve is normal in structure. Tricuspid valve regurgitation was not visualized by color flow Doppler.  8. The aortic valve is normal in structure. Aortic valve regurgitation was not visualized by color flow Doppler. Structurally normal aortic valve, with no evidence of sclerosis or stenosis.  9. The pulmonic valve was normal in structure. Pulmonic valve regurgitation is not visualized by  color flow Doppler. 10. Mildly elevated pulmonary artery systolic pressure. 11. The atrial septum is grossly normal. FINDINGS  Left Ventricle: Left ventricular ejection fraction, by visual estimation, is 55 to 60%. The left ventricle has normal function. There is no left ventricular hypertrophy. Spectral Doppler shows Left ventricular diastolic Doppler parameters are consistent  with pseudonormalization pattern of LV diastolic filling. Right Ventricle: The right ventricular size is normal. No increase in right ventricular wall thickness. Global RV systolic function is has normal systolic function. The tricuspid regurgitant velocity is 2.93 m/s, and with an assumed right atrial pressure  of 3 mmHg, the estimated right ventricular systolic pressure is mildly elevated at 37.3 mmHg. Left Atrium: Left atrial size was normal in size. Right Atrium: Right atrial size was normal in size Pericardium: There is no evidence of pericardial effusion. Mitral Valve: The mitral valve is normal in structure. Trace mitral valve regurgitation. Tricuspid Valve: The tricuspid valve is normal in structure. Tricuspid valve regurgitation was not visualized by color flow Doppler. Aortic Valve: The aortic valve is normal in structure. Aortic valve regurgitation was not visualized by color flow Doppler. The aortic valve is structurally normal, with no evidence of sclerosis or stenosis. Pulmonic Valve: The pulmonic valve was normal in structure. Pulmonic valve regurgitation is not visualized by color flow Doppler. Aorta: The aortic root and ascending aorta are structurally normal, with no evidence of dilitation. IAS/Shunts: The atrial septum is grossly normal.  LEFT VENTRICLE PLAX 2D LVIDd:         3.90 cm  Diastology LVIDs:         2.80 cm       LV e' lateral:   8.59 cm/s LV PW:         0.90 cm       LV E/e' lateral: 6.8 LV IVS:        0.90 cm       LV e' medial:    5.00 cm/s LVOT diam:     2.00 cm       LV E/e' medial:  11.7 LV SV:          36 ml LV SV Index:   19.44 LVOT Area:     3.14 cm  LV Volumes (MOD) LV area d, A2C:    21.20 cm LV area d, A4C:    18.90 cm LV area s, A2C:    13.40 cm LV area s, A4C:    11.90 cm LV major d, A2C:   6.78 cm LV major d, A4C:   6.04 cm LV major s, A2C:   5.82 cm LV major s, A4C:   5.28 cm LV vol d, MOD A2C: 55.5 ml LV vol d, MOD A4C: 49.0 ml LV vol s, MOD A2C: 27.6 ml LV vol s, MOD A4C: 22.8 ml LV SV MOD A2C:     27.9 ml LV SV MOD A4C:     49.0 ml LV SV MOD BP:      29.1 ml RIGHT VENTRICLE RV S prime:     9.68 cm/s TAPSE (M-mode): 1.8 cm LEFT ATRIUM             Index LA diam:        2.10 cm 1.15 cm/m LA Vol (A2C):   34.2 ml 18.76 ml/m LA Vol (A4C):   27.5 ml 15.09 ml/m LA Biplane Vol: 32.2 ml 17.66 ml/m  AORTIC VALVE LVOT Vmax:   67.90 cm/s LVOT Vmean:  46.400 cm/s LVOT VTI:    0.151 m  AORTA Ao Root diam: 3.10 cm Ao Asc diam:  3.20 cm MITRAL VALVE                        TRICUSPID VALVE MV Area (PHT): 4.17 cm             TR Peak grad:   34.3 mmHg MV PHT:        52.78 msec           TR Vmax:        293.00 cm/s MV Decel Time: 182 msec MV E velocity: 58.30 cm/s 103 cm/s  SHUNTS MV A velocity: 52.70 cm/s 70.3 cm/s Systemic VTI:  0.15 m MV E/A ratio:  1.11       1.5       Systemic Diam: 2.00 cm  Mertie Moores MD Electronically signed by Mertie Moores MD Signature Date/Time: 07/17/2019/11:37:13 AM    Final     Assessment & Plan:   Diagnoses and all orders for this visit:  Flu vaccine need -     Flu Vaccine QUAD High Dose(Fluad)     No orders of the defined types were placed in this encounter.    Follow-up: No follow-ups on file.  Walker Kehr, MD

## 2020-06-28 NOTE — Assessment & Plan Note (Signed)
Try Lion's Mane Mushroom extract or capsules for memory 

## 2020-06-28 NOTE — Patient Instructions (Signed)
You can try Lion's Mane Mushroom extract or capsules for memory, pain  Wt Readings from Last 3 Encounters:  06/28/20 149 lb (67.6 kg)  03/16/20 153 lb (69.4 kg)  11/09/19 156 lb (70.8 kg)

## 2020-07-20 DIAGNOSIS — M25562 Pain in left knee: Secondary | ICD-10-CM | POA: Diagnosis not present

## 2020-07-20 DIAGNOSIS — G894 Chronic pain syndrome: Secondary | ICD-10-CM | POA: Diagnosis not present

## 2020-07-20 DIAGNOSIS — M47816 Spondylosis without myelopathy or radiculopathy, lumbar region: Secondary | ICD-10-CM | POA: Diagnosis not present

## 2020-07-20 DIAGNOSIS — Z79891 Long term (current) use of opiate analgesic: Secondary | ICD-10-CM | POA: Diagnosis not present

## 2020-07-21 DIAGNOSIS — G894 Chronic pain syndrome: Secondary | ICD-10-CM | POA: Diagnosis not present

## 2020-07-21 DIAGNOSIS — Z79891 Long term (current) use of opiate analgesic: Secondary | ICD-10-CM | POA: Diagnosis not present

## 2020-07-26 DIAGNOSIS — Z23 Encounter for immunization: Secondary | ICD-10-CM | POA: Diagnosis not present

## 2020-08-22 DIAGNOSIS — M47816 Spondylosis without myelopathy or radiculopathy, lumbar region: Secondary | ICD-10-CM | POA: Diagnosis not present

## 2020-08-22 DIAGNOSIS — M25562 Pain in left knee: Secondary | ICD-10-CM | POA: Diagnosis not present

## 2020-08-22 DIAGNOSIS — Z79891 Long term (current) use of opiate analgesic: Secondary | ICD-10-CM | POA: Diagnosis not present

## 2020-08-22 DIAGNOSIS — G894 Chronic pain syndrome: Secondary | ICD-10-CM | POA: Diagnosis not present

## 2020-09-19 DIAGNOSIS — Z79891 Long term (current) use of opiate analgesic: Secondary | ICD-10-CM | POA: Diagnosis not present

## 2020-09-19 DIAGNOSIS — M47816 Spondylosis without myelopathy or radiculopathy, lumbar region: Secondary | ICD-10-CM | POA: Diagnosis not present

## 2020-09-19 DIAGNOSIS — M25562 Pain in left knee: Secondary | ICD-10-CM | POA: Diagnosis not present

## 2020-09-19 DIAGNOSIS — G894 Chronic pain syndrome: Secondary | ICD-10-CM | POA: Diagnosis not present

## 2020-10-03 ENCOUNTER — Other Ambulatory Visit: Payer: Self-pay

## 2020-10-03 ENCOUNTER — Encounter: Payer: Self-pay | Admitting: Internal Medicine

## 2020-10-03 ENCOUNTER — Ambulatory Visit (INDEPENDENT_AMBULATORY_CARE_PROVIDER_SITE_OTHER): Payer: Medicare Other | Admitting: Internal Medicine

## 2020-10-03 DIAGNOSIS — E559 Vitamin D deficiency, unspecified: Secondary | ICD-10-CM | POA: Diagnosis not present

## 2020-10-03 DIAGNOSIS — R269 Unspecified abnormalities of gait and mobility: Secondary | ICD-10-CM

## 2020-10-03 DIAGNOSIS — E1100 Type 2 diabetes mellitus with hyperosmolarity without nonketotic hyperglycemic-hyperosmolar coma (NKHHC): Secondary | ICD-10-CM

## 2020-10-03 DIAGNOSIS — E538 Deficiency of other specified B group vitamins: Secondary | ICD-10-CM | POA: Diagnosis not present

## 2020-10-03 DIAGNOSIS — F418 Other specified anxiety disorders: Secondary | ICD-10-CM | POA: Diagnosis not present

## 2020-10-03 LAB — COMPREHENSIVE METABOLIC PANEL
ALT: 3 U/L (ref 0–35)
AST: 10 U/L (ref 0–37)
Albumin: 4.2 g/dL (ref 3.5–5.2)
Alkaline Phosphatase: 88 U/L (ref 39–117)
BUN: 14 mg/dL (ref 6–23)
CO2: 28 mEq/L (ref 19–32)
Calcium: 9 mg/dL (ref 8.4–10.5)
Chloride: 99 mEq/L (ref 96–112)
Creatinine, Ser: 0.68 mg/dL (ref 0.40–1.20)
GFR: 88.44 mL/min (ref >60.00)
Glucose, Bld: 245 mg/dL — ABNORMAL HIGH (ref 70–99)
Potassium: 4.6 mEq/L (ref 3.5–5.1)
Sodium: 134 mEq/L — ABNORMAL LOW (ref 135–145)
Total Bilirubin: 0.7 mg/dL (ref 0.2–1.2)
Total Protein: 6.7 g/dL (ref 6.0–8.3)

## 2020-10-03 LAB — HEMOGLOBIN A1C: Hgb A1c MFr Bld: 10 % — ABNORMAL HIGH (ref 4.6–6.5)

## 2020-10-03 LAB — VITAMIN D 25 HYDROXY (VIT D DEFICIENCY, FRACTURES): VITD: 18.24 ng/mL — ABNORMAL LOW (ref 30.00–100.00)

## 2020-10-03 MED ORDER — SHINGRIX 50 MCG/0.5ML IM SUSR: 0.5000 mL | Freq: Once | INTRAMUSCULAR | 1 refills | Status: AC

## 2020-10-03 MED ORDER — DICLOFENAC SODIUM 1 % EX GEL: 1.0000 "application " | Freq: Four times a day (QID) | CUTANEOUS | 3 refills | Status: AC

## 2020-10-03 MED ORDER — LORAZEPAM 2 MG PO TABS: ORAL_TABLET | ORAL | 3 refills | Status: DC

## 2020-10-03 NOTE — Assessment & Plan Note (Signed)
Prandin, Metformin 

## 2020-10-03 NOTE — Assessment & Plan Note (Signed)
On B12 

## 2020-10-03 NOTE — Assessment & Plan Note (Signed)
In a w/c 

## 2020-10-03 NOTE — Patient Instructions (Signed)
TRIGGER THUMB SPLINT Voltaren gel   Trigger Finger  Trigger finger, also called stenosing tenosynovitis,  is a condition that causes a finger to get stuck in a bent position. Each finger has a tendon, which is a tough, cord-like tissue that connects muscle to bone, and each tendon passes through a tunnel of tissue called a tendon sheath. To move your finger, your tendon needs to glide freely through the sheath. Trigger finger happens when the tendon or the sheath thickens, making it difficult to move your finger. Trigger finger can affect any finger or a thumb. It may affect more than one finger. Mild cases may clear up with rest and medicine. Severe cases require more treatment. What are the causes? Trigger finger is caused by a thickened finger tendon or tendon sheath. The cause of this thickening is not known. What increases the risk? The following factors may make you more likely to develop this condition:  Doing activities that require a strong grip.  Having rheumatoid arthritis, gout, or diabetes.  Being 55-34 years old.  Being female. What are the signs or symptoms? Symptoms of this condition include:  Pain when bending or straightening your finger.  Tenderness or swelling where your finger attaches to the palm of your hand.  A lump in the palm of your hand or on the inside of your finger.  Hearing a noise like a pop or a snap when you try to straighten your finger.  Feeling a catching or locking sensation when you try to straighten your finger.  Being unable to straighten your finger. How is this diagnosed? This condition is diagnosed based on your symptoms and a physical exam. How is this treated? This condition may be treated by:  Resting your finger and avoiding activities that make symptoms worse.  Wearing a finger splint to keep your finger extended.  Taking NSAIDs, such as ibuprofen, to relieve pain and swelling.  Doing gentle exercises to stretch the finger  as told by your health care provider.  Having medicine that reduces swelling and inflammation (steroids) injected into the tendon sheath. Injections may need to be repeated.  Having surgery to open the tendon sheath. This may be done if other treatments do not work and you cannot straighten your finger. You may need physical therapy after surgery. Follow these instructions at home: If you have a splint:  Wear the splint as told by your health care provider. Remove it only as told by your health care provider.  Loosen it if your fingers tingle, become numb, or turn cold and blue.  Keep it clean.  If the splint is not waterproof: ? Do not let it get wet. ? Cover it with a watertight covering when you take a bath or shower. Managing pain, stiffness, and swelling     If directed, apply heat to the affected area as often as told by your health care provider. Use the heat source that your health care provider recommends, such as a moist heat pack or a heating pad.  Place a towel between your skin and the heat source.  Leave the heat on for 20-30 minutes.  Remove the heat if your skin turns bright red. This is especially important if you are unable to feel pain, heat, or cold. You may have a greater risk of getting burned. If directed, put ice on the painful area. To do this:  If you have a removable splint, remove it as told by your health care provider.  Put ice  in a plastic bag.  Place a towel between your skin and the bag or between your splint and the bag.  Leave the ice on for 20 minutes, 2-3 times a day.  Activity  Rest your finger as told by your health care provider. Avoid activities that make the pain worse.  Return to your normal activities as told by your health care provider. Ask your health care provider what activities are safe for you.  Do exercises as told by your health care provider.  Ask your health care provider when it is safe to drive if you have a splint  on your hand. General instructions  Take over-the-counter and prescription medicines only as told by your health care provider.  Keep all follow-up visits as told by your health care provider. This is important. Contact a health care provider if:  Your symptoms are not improving with home care. Summary  Trigger finger, also called stenosing tenosynovitis, causes your finger to get stuck in a bent position. This can make it difficult and painful to straighten your finger.  This condition develops when a finger tendon or tendon sheath thickens.  Treatment may include resting your finger, wearing a splint, and taking medicines.  In severe cases, surgery to open the tendon sheath may be needed. This information is not intended to replace advice given to you by your health care provider. Make sure you discuss any questions you have with your health care provider. Document Revised: 02/02/2019 Document Reviewed: 02/02/2019 Elsevier Patient Education  2020 [employer].

## 2020-10-03 NOTE — Progress Notes (Signed)
Subjective:  Patient ID: [name], female    DOB: [birth year]  Age: 70 y.o. MRN: [id number]  CC: Follow-up (3 month f/u)   HPI Demyah D Braithwaite presents for chronic pain, anxiety, DM C/o triggering thumb R   Outpatient Medications Prior to Visit  Medication Sig Dispense Refill  . albuterol (VENTOLIN HFA) 108 (90 Base) MCG/ACT inhaler Inhale 1-2 puffs into the lungs every 4 (four) hours as needed for wheezing or shortness of breath. 56 g 3  . aspirin 81 MG EC tablet Take 81 mg by mouth daily.    . Blood Glucose Monitoring Suppl (ONETOUCH VERIO) w/Device KIT 1 Units by Does not apply route daily as needed. 1 kit 1  . carbidopa-levodopa (SINEMET IR) 25-250 MG tablet Take 1 tablet by mouth 3 (three) times daily. Patient needs office visit before refills will be given 270 tablet 3  . Cholecalciferol (VITAMIN D3) 50 MCG (2000 UT) capsule Take 1 capsule (2,000 Units total) by mouth daily. 100 capsule 3  . gabapentin (NEURONTIN) 100 MG capsule Take 200 mg by mouth 3 (three) times daily.  2  . glucose blood (ONETOUCH VERIO) test strip Use to check blood sugars twice a day. Dx E11.9 50 each 11  . HYDROmorphone (DILAUDID) 2 MG tablet Take 2-3 tablets (4-6 mg total) by mouth every 6 (six) hours as needed for moderate pain or severe pain. (Patient taking differently: Take 2 mg by mouth daily.) 75 tablet 0  . LORazepam (ATIVAN) 2 MG tablet TAKE 1 TABLET EVERY 8 HOURS AS NEEDED FOR ANXIETY. 90 tablet 3  . metFORMIN (GLUCOPHAGE-XR) 750 MG 24 hr tablet Take 1 tablet (750 mg total) by mouth 2 (two) times daily. 180 tablet 3  . metoprolol tartrate (LOPRESSOR) 25 MG tablet TAKE ONE-HALF TABLET (12.5 MG TOTAL) BY MOUTH 2 (TWO) TIMES DAILY 90 tablet 3  . mirtazapine (REMERON) 15 MG tablet TAKE 1 TABLET BY MOUTH AT BEDTIME AT 6-8 PM 90 tablet 3  . multivitamin-lutein (OCUVITE-LUTEIN) CAPS capsule Take 1 capsule by mouth daily.    [name] Delica Lancets 72Z MISC Use to help check blood sugars once a day. Dx  E11.9 50 each 11  . repaglinide (PRANDIN) 2 MG tablet Take 1 tablet (2 mg total) by mouth 3 (three) times daily before meals. 270 tablet 3  . tamsulosin (FLOMAX) 0.4 MG CAPS capsule Take 1 capsule (0.4 mg total) by mouth daily. 90 capsule 3   No facility-administered medications prior to visit.    ROS: Review of Systems  Constitutional: Negative for activity change, appetite change, chills, fatigue and unexpected weight change.  HENT: Negative for congestion, mouth sores and sinus pressure.   Eyes: Negative for visual disturbance.  Respiratory: Negative for cough and chest tightness.   Gastrointestinal: Negative for abdominal pain and nausea.  Genitourinary: Negative for difficulty urinating, frequency and vaginal pain.  Musculoskeletal: Negative for back pain and gait problem.  Skin: Negative for pallor and rash.  Neurological: Negative for dizziness, tremors, weakness, numbness and headaches.  Psychiatric/Behavioral: Negative for confusion, sleep disturbance and suicidal ideas. The patient is nervous/anxious.     Objective:  BP 120/70 (BP Location: Left Arm)   Pulse 80   Temp 98.1 F (36.7 C) (Oral)   Wt 148 lb (67.1 kg)   SpO2 92%   BMI 24.63 kg/m   BP Readings from Last 3 Encounters:  10/03/20 120/70  06/28/20 130/80  03/16/20 (!) 120/56    Wt Readings from Last 3 Encounters:  10/03/20 148 lb (67.1 kg)  06/28/20 149 lb (67.6 kg)  03/16/20 153 lb (69.4 kg)    Physical Exam Constitutional:      General: She is not in acute distress.    Appearance: She is well-developed.  HENT:     Head: Normocephalic.     Right Ear: External ear normal.     Left Ear: External ear normal.     Nose: Nose normal.     Mouth/Throat:     Mouth: Oropharynx is clear and moist.  Eyes:     General:        Right eye: No discharge.        Left eye: No discharge.     Conjunctiva/sclera: Conjunctivae normal.     Pupils: Pupils are equal, round, and reactive to light.  Neck:      Thyroid: No thyromegaly.     Vascular: No JVD.     Trachea: No tracheal deviation.  Cardiovascular:     Rate and Rhythm: Normal rate and regular rhythm.     Heart sounds: Normal heart sounds.  Pulmonary:     Effort: No respiratory distress.     Breath sounds: No stridor. No wheezing.  Abdominal:     General: Bowel sounds are normal. There is no distension.     Palpations: Abdomen is soft. There is no mass.     Tenderness: There is no abdominal tenderness. There is no guarding or rebound.  Musculoskeletal:        General: No tenderness or edema.     Cervical back: Normal range of motion and neck supple.  Lymphadenopathy:     Cervical: No cervical adenopathy.  Skin:    Findings: No erythema or rash.  Neurological:     Cranial Nerves: No cranial nerve deficit.     Motor: No abnormal muscle tone.     Coordination: Coordination normal.     Deep Tendon Reflexes: Reflexes normal.  Psychiatric:        Mood and Affect: Mood and affect normal.        Behavior: Behavior normal.        Thought Content: Thought content normal.        Judgment: Judgment normal.   trigger thumb R In a w/c  Lab Results  Component Value Date   WBC 5.5 07/17/2019   HGB 12.6 07/17/2019   HCT 42.6 07/17/2019   PLT 172 07/17/2019   GLUCOSE 229 (H) 03/16/2020   CHOL 213 (H) 04/16/2017   TRIG 183.0 (H) 04/16/2017   HDL 93.40 04/16/2017   LDLDIRECT 100.8 10/26/2013   LDLCALC 83 04/16/2017   ALT 4 11/09/2019   AST 14 11/09/2019   NA 136 03/16/2020   K 4.2 03/16/2020   CL 97 03/16/2020   CREATININE 0.65 03/16/2020   BUN 8 03/16/2020   CO2 31 03/16/2020   TSH 1.92 03/20/2018   INR 1.12 11/08/2016   HGBA1C 9.5 (H) 03/16/2020    DG Chest 2 View  Result Date: 07/16/2019 CLINICAL DATA:  Shortness of breath EXAM: CHEST - 2 VIEW COMPARISON:  08/19/2018 FINDINGS: Normal heart size and mediastinal contours. No acute infiltrate or edema. No effusion or pneumothorax. No acute osseous findings. Remote left  humerus fracture repair. Postoperative left upper quadrant. IMPRESSION: No evidence of active disease Electronically Signed   By: Monte Fantasia M.D.   On: 07/16/2019 04:06   ECHOCARDIOGRAM COMPLETE  Result Date: 07/17/2019   ECHOCARDIOGRAM REPORT   Patient Name:   [facility]  Raina Mina Date of Exam: 07/17/2019 Medical Rec #:  [id number]    Height:       65.0 in Accession #:    [id number]   Weight:       165.0 lb Date of Birth:  05-15-1951   BSA:          1.82 m Patient Age:    81 years     BP:           137/68 mmHg Patient Gender: F            HR:           69 bpm. Exam Location:  Inpatient Procedure: 2D Echo, Color Doppler and Cardiac Doppler Indications:    R06.9 DOE  History:        Patient has prior history of Echocardiogram examinations, most                 recent 07/19/2016. COPD Risk Factors:Diabetes and Dyslipidemia.  Sonographer:    Raquel Sarna Senior RDCS Referring Phys: [id number] [place]  1. Left ventricular ejection fraction, by visual estimation, is 55 to 60%. The left ventricle has normal function. There is no left ventricular hypertrophy.  2. Left ventricular diastolic Doppler parameters are consistent with pseudonormalization pattern of LV diastolic filling.  3. Global right ventricle has normal systolic function.The right ventricular size is normal. No increase in right ventricular wall thickness.  4. Left atrial size was normal.  5. Right atrial size was normal.  6. The mitral valve is normal in structure. Trace mitral valve regurgitation.  7. The tricuspid valve is normal in structure. Tricuspid valve regurgitation was not visualized by color flow Doppler.  8. The aortic valve is normal in structure. Aortic valve regurgitation was not visualized by color flow Doppler. Structurally normal aortic valve, with no evidence of sclerosis or stenosis.  9. The pulmonic valve was normal in structure. Pulmonic valve regurgitation is not visualized by color flow Doppler. 10. Mildly elevated  pulmonary artery systolic pressure. 11. The atrial septum is grossly normal. FINDINGS  Left Ventricle: Left ventricular ejection fraction, by visual estimation, is 55 to 60%. The left ventricle has normal function. There is no left ventricular hypertrophy. Spectral Doppler shows Left ventricular diastolic Doppler parameters are consistent  with pseudonormalization pattern of LV diastolic filling. Right Ventricle: The right ventricular size is normal. No increase in right ventricular wall thickness. Global RV systolic function is has normal systolic function. The tricuspid regurgitant velocity is 2.93 m/s, and with an assumed right atrial pressure  of 3 mmHg, the estimated right ventricular systolic pressure is mildly elevated at 37.3 mmHg. Left Atrium: Left atrial size was normal in size. Right Atrium: Right atrial size was normal in size Pericardium: There is no evidence of pericardial effusion. Mitral Valve: The mitral valve is normal in structure. Trace mitral valve regurgitation. Tricuspid Valve: The tricuspid valve is normal in structure. Tricuspid valve regurgitation was not visualized by color flow Doppler. Aortic Valve: The aortic valve is normal in structure. Aortic valve regurgitation was not visualized by color flow Doppler. The aortic valve is structurally normal, with no evidence of sclerosis or stenosis. Pulmonic Valve: The pulmonic valve was normal in structure. Pulmonic valve regurgitation is not visualized by color flow Doppler. Aorta: The aortic root and ascending aorta are structurally normal, with no evidence of dilitation. IAS/Shunts: The atrial septum is grossly normal.  LEFT VENTRICLE PLAX 2D LVIDd:         3.90  cm       Diastology LVIDs:         2.80 cm       LV e' lateral:   8.59 cm/s LV PW:         0.90 cm       LV E/e' lateral: 6.8 LV IVS:        0.90 cm       LV e' medial:    5.00 cm/s LVOT diam:     2.00 cm       LV E/e' medial:  11.7 LV SV:         36 ml LV SV Index:   19.44 LVOT Area:      3.14 cm  LV Volumes (MOD) LV area d, A2C:    21.20 cm LV area d, A4C:    18.90 cm LV area s, A2C:    13.40 cm LV area s, A4C:    11.90 cm LV major d, A2C:   6.78 cm LV major d, A4C:   6.04 cm LV major s, A2C:   5.82 cm LV major s, A4C:   5.28 cm LV vol d, MOD A2C: 55.5 ml LV vol d, MOD A4C: 49.0 ml LV vol s, MOD A2C: 27.6 ml LV vol s, MOD A4C: 22.8 ml LV SV MOD A2C:     27.9 ml LV SV MOD A4C:     49.0 ml LV SV MOD BP:      29.1 ml RIGHT VENTRICLE RV S prime:     9.68 cm/s TAPSE (M-mode): 1.8 cm LEFT ATRIUM             Index LA diam:        2.10 cm 1.15 cm/m LA Vol (A2C):   34.2 ml 18.76 ml/m LA Vol (A4C):   27.5 ml 15.09 ml/m LA Biplane Vol: 32.2 ml 17.66 ml/m  AORTIC VALVE LVOT Vmax:   67.90 cm/s LVOT Vmean:  46.400 cm/s LVOT VTI:    0.151 m  AORTA Ao Root diam: 3.10 cm Ao Asc diam:  3.20 cm MITRAL VALVE                        TRICUSPID VALVE MV Area (PHT): 4.17 cm             TR Peak grad:   34.3 mmHg MV PHT:        52.78 msec           TR Vmax:        293.00 cm/s MV Decel Time: 182 msec MV E velocity: 58.30 cm/s 103 cm/s  SHUNTS MV A velocity: 52.70 cm/s 70.3 cm/s Systemic VTI:  0.15 m MV E/A ratio:  1.11       1.5       Systemic Diam: 2.00 cm  Mertie Moores MD Electronically signed by Mertie Moores MD Signature Date/Time: 07/17/2019/11:37:13 AM    Final     Assessment & Plan:    Walker Kehr, MD

## 2020-10-03 NOTE — Assessment & Plan Note (Signed)
On Vit D 

## 2020-10-03 NOTE — Assessment & Plan Note (Signed)
Chronic Lorazepam prn  Potential benefits of a long term benzodiazepines  use as well as potential risks  and complications were explained to the patient and were aknowledged. 

## 2020-10-04 ENCOUNTER — Other Ambulatory Visit: Payer: Self-pay | Admitting: Internal Medicine

## 2020-10-04 MED ORDER — VITAMIN D3 1.25 MG (50000 UT) PO CAPS: 1.0000 | ORAL_CAPSULE | ORAL | 0 refills | Status: DC

## 2020-10-12 ENCOUNTER — Encounter: Payer: Self-pay | Admitting: Internal Medicine

## 2020-10-18 DIAGNOSIS — G894 Chronic pain syndrome: Secondary | ICD-10-CM | POA: Diagnosis not present

## 2020-10-18 DIAGNOSIS — M25562 Pain in left knee: Secondary | ICD-10-CM | POA: Diagnosis not present

## 2020-10-18 DIAGNOSIS — M47816 Spondylosis without myelopathy or radiculopathy, lumbar region: Secondary | ICD-10-CM | POA: Diagnosis not present

## 2020-10-18 DIAGNOSIS — Z79891 Long term (current) use of opiate analgesic: Secondary | ICD-10-CM | POA: Diagnosis not present

## 2020-11-14 ENCOUNTER — Ambulatory Visit (INDEPENDENT_AMBULATORY_CARE_PROVIDER_SITE_OTHER): Payer: Medicare Other | Admitting: Internal Medicine

## 2020-11-14 ENCOUNTER — Encounter: Payer: Self-pay | Admitting: Internal Medicine

## 2020-11-14 ENCOUNTER — Other Ambulatory Visit: Payer: Self-pay

## 2020-11-14 DIAGNOSIS — M65311 Trigger thumb, right thumb: Secondary | ICD-10-CM | POA: Diagnosis not present

## 2020-11-14 DIAGNOSIS — E538 Deficiency of other specified B group vitamins: Secondary | ICD-10-CM | POA: Diagnosis not present

## 2020-11-14 NOTE — Progress Notes (Signed)
Subjective:  Patient ID: [name], female    DOB: 07-29-1951  Age: 70 y.o. MRN: [id number]  CC: thumb pain   HPI Lorrene D Larzelere presents for R thumb pain x 6 weeks  Outpatient Medications Prior to Visit  Medication Sig Dispense Refill  . albuterol (VENTOLIN HFA) 108 (90 Base) MCG/ACT inhaler Inhale 1-2 puffs into the lungs every 4 (four) hours as needed for wheezing or shortness of breath. 56 g 3  . aspirin 81 MG EC tablet Take 81 mg by mouth daily.    . Blood Glucose Monitoring Suppl (ONETOUCH VERIO) w/Device KIT 1 Units by Does not apply route daily as needed. 1 kit 1  . carbidopa-levodopa (SINEMET IR) 25-250 MG tablet Take 1 tablet by mouth 3 (three) times daily. Patient needs office visit before refills will be given 270 tablet 3  . Cholecalciferol (VITAMIN D3) 1.25 MG (50000 UT) CAPS Take 1 capsule by mouth once a week. 8 capsule 0  . Cholecalciferol (VITAMIN D3) 50 MCG (2000 UT) capsule Take 1 capsule (2,000 Units total) by mouth daily. 100 capsule 3  . diclofenac Sodium (VOLTAREN) 1 % GEL Apply 1 application topically 4 (four) times daily. 100 g 3  . glucose blood (ONETOUCH VERIO) test strip Use to check blood sugars twice a day. Dx E11.9 50 each 11  . HYDROmorphone (DILAUDID) 2 MG tablet Take 2-3 tablets (4-6 mg total) by mouth every 6 (six) hours as needed for moderate pain or severe pain. (Patient taking differently: Take 2 mg by mouth daily.) 75 tablet 0  . LORazepam (ATIVAN) 2 MG tablet TAKE 1 TABLET EVERY 8 HOURS AS NEEDED FOR ANXIETY. 90 tablet 3  . metoprolol tartrate (LOPRESSOR) 25 MG tablet TAKE ONE-HALF TABLET (12.5 MG TOTAL) BY MOUTH 2 (TWO) TIMES DAILY 90 tablet 3  . mirtazapine (REMERON) 15 MG tablet TAKE 1 TABLET BY MOUTH AT BEDTIME AT 6-8 PM 90 tablet 3  . multivitamin-lutein (OCUVITE-LUTEIN) CAPS capsule Take 1 capsule by mouth daily.    [name] Delica Lancets 34K MISC Use to help check blood sugars once a day. Dx E11.9 50 each 11  . repaglinide (PRANDIN) 2 MG  tablet Take 1 tablet (2 mg total) by mouth 3 (three) times daily before meals. 270 tablet 3  . tamsulosin (FLOMAX) 0.4 MG CAPS capsule Take 1 capsule (0.4 mg total) by mouth daily. 90 capsule 3  . gabapentin (NEURONTIN) 100 MG capsule Take 200 mg by mouth 3 (three) times daily. (Patient not taking: Reported on 11/14/2020)  2  . metFORMIN (GLUCOPHAGE-XR) 750 MG 24 hr tablet Take 1 tablet (750 mg total) by mouth 2 (two) times daily. (Patient not taking: Reported on 11/14/2020) 180 tablet 3   No facility-administered medications prior to visit.    ROS: Review of Systems  Objective:  BP 120/70 (BP Location: Left Arm)   Pulse 81   Temp 98.2 F (36.8 C) (Oral)   SpO2 91%   BP Readings from Last 3 Encounters:  11/14/20 120/70  10/03/20 120/70  06/28/20 130/80    Wt Readings from Last 3 Encounters:  10/03/20 148 lb (67.1 kg)  06/28/20 149 lb (67.6 kg)  03/16/20 153 lb (69.4 kg)    Physical Exam  Lab Results  Component Value Date   WBC 5.5 07/17/2019   HGB 12.6 07/17/2019   HCT 42.6 07/17/2019   PLT 172 07/17/2019   GLUCOSE 245 (H) 10/03/2020   CHOL 213 (H) 04/16/2017   TRIG 183.0 (H) 04/16/2017  HDL 93.40 04/16/2017   LDLDIRECT 100.8 10/26/2013   LDLCALC 83 04/16/2017   ALT 3 10/03/2020   AST 10 10/03/2020   NA 134 (L) 10/03/2020   K 4.6 10/03/2020   CL 99 10/03/2020   CREATININE 0.68 10/03/2020   BUN 14 10/03/2020   CO2 28 10/03/2020   TSH 1.92 03/20/2018   INR 1.12 11/08/2016   HGBA1C 10.0 (H) 10/03/2020    DG Chest 2 View  Result Date: 07/16/2019 CLINICAL DATA:  Shortness of breath EXAM: CHEST - 2 VIEW COMPARISON:  08/19/2018 FINDINGS: Normal heart size and mediastinal contours. No acute infiltrate or edema. No effusion or pneumothorax. No acute osseous findings. Remote left humerus fracture repair. Postoperative left upper quadrant. IMPRESSION: No evidence of active disease Electronically Signed   By: Monte Fantasia M.D.   On: 07/16/2019 04:06    ECHOCARDIOGRAM COMPLETE  Result Date: 07/17/2019   ECHOCARDIOGRAM REPORT   Patient Name:   [name] Date of Exam: 07/17/2019 Medical Rec #:  [id number]    Height:       65.0 in Accession #:    [id number]   Weight:       165.0 lb Date of Birth:  October 01, 1951   BSA:          1.82 m Patient Age:    6 years     BP:           137/68 mmHg Patient Gender: F            HR:           69 bpm. Exam Location:  Inpatient Procedure: 2D Echo, Color Doppler and Cardiac Doppler Indications:    R06.9 DOE  History:        Patient has prior history of Echocardiogram examinations, most                 recent 07/19/2016. COPD Risk Factors:Diabetes and Dyslipidemia.  Sonographer:    Raquel Sarna Senior RDCS Referring Phys: [id number] [place]  1. Left ventricular ejection fraction, by visual estimation, is 55 to 60%. The left ventricle has normal function. There is no left ventricular hypertrophy.  2. Left ventricular diastolic Doppler parameters are consistent with pseudonormalization pattern of LV diastolic filling.  3. Global right ventricle has normal systolic function.The right ventricular size is normal. No increase in right ventricular wall thickness.  4. Left atrial size was normal.  5. Right atrial size was normal.  6. The mitral valve is normal in structure. Trace mitral valve regurgitation.  7. The tricuspid valve is normal in structure. Tricuspid valve regurgitation was not visualized by color flow Doppler.  8. The aortic valve is normal in structure. Aortic valve regurgitation was not visualized by color flow Doppler. Structurally normal aortic valve, with no evidence of sclerosis or stenosis.  9. The pulmonic valve was normal in structure. Pulmonic valve regurgitation is not visualized by color flow Doppler. 10. Mildly elevated pulmonary artery systolic pressure. 11. The atrial septum is grossly normal. FINDINGS  Left Ventricle: Left ventricular ejection fraction, by visual estimation, is 55 to 60%.  The left ventricle has normal function. There is no left ventricular hypertrophy. Spectral Doppler shows Left ventricular diastolic Doppler parameters are consistent  with pseudonormalization pattern of LV diastolic filling. Right Ventricle: The right ventricular size is normal. No increase in right ventricular wall thickness. Global RV systolic function is has normal systolic function. The tricuspid regurgitant velocity is 2.93 m/s, and with an assumed right  atrial pressure  of 3 mmHg, the estimated right ventricular systolic pressure is mildly elevated at 37.3 mmHg. Left Atrium: Left atrial size was normal in size. Right Atrium: Right atrial size was normal in size Pericardium: There is no evidence of pericardial effusion. Mitral Valve: The mitral valve is normal in structure. Trace mitral valve regurgitation. Tricuspid Valve: The tricuspid valve is normal in structure. Tricuspid valve regurgitation was not visualized by color flow Doppler. Aortic Valve: The aortic valve is normal in structure. Aortic valve regurgitation was not visualized by color flow Doppler. The aortic valve is structurally normal, with no evidence of sclerosis or stenosis. Pulmonic Valve: The pulmonic valve was normal in structure. Pulmonic valve regurgitation is not visualized by color flow Doppler. Aorta: The aortic root and ascending aorta are structurally normal, with no evidence of dilitation. IAS/Shunts: The atrial septum is grossly normal.  LEFT VENTRICLE PLAX 2D LVIDd:         3.90 cm       Diastology LVIDs:         2.80 cm       LV e' lateral:   8.59 cm/s LV PW:         0.90 cm       LV E/e' lateral: 6.8 LV IVS:        0.90 cm       LV e' medial:    5.00 cm/s LVOT diam:     2.00 cm       LV E/e' medial:  11.7 LV SV:         36 ml LV SV Index:   19.44 LVOT Area:     3.14 cm  LV Volumes (MOD) LV area d, A2C:    21.20 cm LV area d, A4C:    18.90 cm LV area s, A2C:    13.40 cm LV area s, A4C:    11.90 cm LV major d, A2C:   6.78 cm  LV major d, A4C:   6.04 cm LV major s, A2C:   5.82 cm LV major s, A4C:   5.28 cm LV vol d, MOD A2C: 55.5 ml LV vol d, MOD A4C: 49.0 ml LV vol s, MOD A2C: 27.6 ml LV vol s, MOD A4C: 22.8 ml LV SV MOD A2C:     27.9 ml LV SV MOD A4C:     49.0 ml LV SV MOD BP:      29.1 ml RIGHT VENTRICLE RV S prime:     9.68 cm/s TAPSE (M-mode): 1.8 cm LEFT ATRIUM             Index LA diam:        2.10 cm 1.15 cm/m LA Vol (A2C):   34.2 ml 18.76 ml/m LA Vol (A4C):   27.5 ml 15.09 ml/m LA Biplane Vol: 32.2 ml 17.66 ml/m  AORTIC VALVE LVOT Vmax:   67.90 cm/s LVOT Vmean:  46.400 cm/s LVOT VTI:    0.151 m  AORTA Ao Root diam: 3.10 cm Ao Asc diam:  3.20 cm MITRAL VALVE                        TRICUSPID VALVE MV Area (PHT): 4.17 cm             TR Peak grad:   34.3 mmHg MV PHT:        52.78 msec           TR Vmax:  293.00 cm/s MV Decel Time: 182 msec MV E velocity: 58.30 cm/s 103 cm/s  SHUNTS MV A velocity: 52.70 cm/s 70.3 cm/s Systemic VTI:  0.15 m MV E/A ratio:  1.11       1.5       Systemic Diam: 2.00 cm  Mertie Moores MD Electronically signed by Mertie Moores MD Signature Date/Time: 07/17/2019/11:37:13 AM    Final     Assessment & Plan:     Follow-up: Return in about 2 weeks (around 11/28/2020) for a follow-up visit.  Walker Kehr, MD

## 2020-11-14 NOTE — Patient Instructions (Addendum)
Trigger Thumb Splint - Thumb Spica Support Brace Stabilizer for Pain, Sprains, Arthritis, Tendonitis  Voltaren gel   Trigger Finger  Trigger finger, also called stenosing tenosynovitis,  is a condition that causes a finger to get stuck in a bent position. Each finger has a tendon, which is a tough, cord-like tissue that connects muscle to bone, and each tendon passes through a tunnel of tissue called a tendon sheath. To move your finger, your tendon needs to glide freely through the sheath. Trigger finger happens when the tendon or the sheath thickens, making it difficult to move your finger. Trigger finger can affect any finger or a thumb. It may affect more than one finger. Mild cases may clear up with rest and medicine. Severe cases require more treatment. What are the causes? Trigger finger is caused by a thickened finger tendon or tendon sheath. The cause of this thickening is not known. What increases the risk? The following factors may make you more likely to develop this condition:  Doing activities that require a strong grip.  Having rheumatoid arthritis, gout, or diabetes.  Being 87-69 years old.  Being female. What are the signs or symptoms? Symptoms of this condition include:  Pain when bending or straightening your finger.  Tenderness or swelling where your finger attaches to the palm of your hand.  A lump in the palm of your hand or on the inside of your finger.  Hearing a noise like a pop or a snap when you try to straighten your finger.  Feeling a catching or locking sensation when you try to straighten your finger.  Being unable to straighten your finger. How is this diagnosed? This condition is diagnosed based on your symptoms and a physical exam. How is this treated? This condition may be treated by:  Resting your finger and avoiding activities that make symptoms worse.  Wearing a finger splint to keep your finger extended.  Taking NSAIDs, such as  ibuprofen, to relieve pain and swelling.  Doing gentle exercises to stretch the finger as told by your health care provider.  Having medicine that reduces swelling and inflammation (steroids) injected into the tendon sheath. Injections may need to be repeated.  Having surgery to open the tendon sheath. This may be done if other treatments do not work and you cannot straighten your finger. You may need physical therapy after surgery. Follow these instructions at home: If you have a splint:  Wear the splint as told by your health care provider. Remove it only as told by your health care provider.  Loosen it if your fingers tingle, become numb, or turn cold and blue.  Keep it clean.  If the splint is not waterproof: ? Do not let it get wet. ? Cover it with a watertight covering when you take a bath or shower. Managing pain, stiffness, and swelling If directed, apply heat to the affected area as often as told by your health care provider. Use the heat source that your health care provider recommends, such as a moist heat pack or a heating pad.  Place a towel between your skin and the heat source.  Leave the heat on for 20-30 minutes.  Remove the heat if your skin turns bright red. This is especially important if you are unable to feel pain, heat, or cold. You may have a greater risk of getting burned. If directed, put ice on the painful area. To do this:  If you have a removable splint, remove it as told  by your health care provider.  Put ice in a plastic bag.  Place a towel between your skin and the bag or between your splint and the bag.  Leave the ice on for 20 minutes, 2-3 times a day.      Activity  Rest your finger as told by your health care provider. Avoid activities that make the pain worse.  Return to your normal activities as told by your health care provider. Ask your health care provider what activities are safe for you.  Do exercises as told by your health care  provider.  Ask your health care provider when it is safe to drive if you have a splint on your hand. General instructions  Take over-the-counter and prescription medicines only as told by your health care provider.  Keep all follow-up visits as told by your health care provider. This is important. Contact a health care provider if:  Your symptoms are not improving with home care. Summary  Trigger finger, also called stenosing tenosynovitis, causes your finger to get stuck in a bent position. This can make it difficult and painful to straighten your finger.  This condition develops when a finger tendon or tendon sheath thickens.  Treatment may include resting your finger, wearing a splint, and taking medicines.  In severe cases, surgery to open the tendon sheath may be needed. This information is not intended to replace advice given to you by your health care provider. Make sure you discuss any questions you have with your health care provider.

## 2020-11-14 NOTE — Assessment & Plan Note (Signed)
Risks associated with treatment noncompliance were discussed. Compliance was encouraged. Restart B12

## 2020-11-14 NOTE — Assessment & Plan Note (Signed)
Trigger Thumb Splint - Thumb Spica Support Brace Stabilizer for Pain, Sprains, Arthritis, Tendonitis  Voltaren  Pt declined injection

## 2020-11-16 DIAGNOSIS — M47816 Spondylosis without myelopathy or radiculopathy, lumbar region: Secondary | ICD-10-CM | POA: Diagnosis not present

## 2020-11-16 DIAGNOSIS — M25562 Pain in left knee: Secondary | ICD-10-CM | POA: Diagnosis not present

## 2020-11-16 DIAGNOSIS — Z79891 Long term (current) use of opiate analgesic: Secondary | ICD-10-CM | POA: Diagnosis not present

## 2020-11-16 DIAGNOSIS — G894 Chronic pain syndrome: Secondary | ICD-10-CM | POA: Diagnosis not present

## 2020-11-25 ENCOUNTER — Other Ambulatory Visit: Payer: Self-pay

## 2020-11-28 ENCOUNTER — Other Ambulatory Visit: Payer: Self-pay

## 2020-11-28 ENCOUNTER — Ambulatory Visit (INDEPENDENT_AMBULATORY_CARE_PROVIDER_SITE_OTHER): Payer: Medicare Other | Admitting: Internal Medicine

## 2020-11-28 ENCOUNTER — Encounter: Payer: Self-pay | Admitting: Internal Medicine

## 2020-11-28 DIAGNOSIS — M65311 Trigger thumb, right thumb: Secondary | ICD-10-CM

## 2020-11-28 NOTE — Progress Notes (Signed)
Subjective:  Patient ID: [name], female    DOB: Jan 31, 1951  Age: 70 y.o. MRN: [id number]  CC: Follow-up (2 week f/u)   HPI Heiress D Bohorquez presents for R thumb pain - not much better. No pain w/a splint on  Outpatient Medications Prior to Visit  Medication Sig Dispense Refill  . albuterol (VENTOLIN HFA) 108 (90 Base) MCG/ACT inhaler Inhale 1-2 puffs into the lungs every 4 (four) hours as needed for wheezing or shortness of breath. 56 g 3  . aspirin 81 MG EC tablet Take 81 mg by mouth daily.    . Blood Glucose Monitoring Suppl (ONETOUCH VERIO) w/Device KIT 1 Units by Does not apply route daily as needed. 1 kit 1  . carbidopa-levodopa (SINEMET IR) 25-250 MG tablet Take 1 tablet by mouth 3 (three) times daily. Patient needs office visit before refills will be given 270 tablet 3  . Cholecalciferol (VITAMIN D3) 1.25 MG (50000 UT) CAPS Take 1 capsule by mouth once a week. 8 capsule 0  . Cholecalciferol (VITAMIN D3) 50 MCG (2000 UT) capsule Take 1 capsule (2,000 Units total) by mouth daily. 100 capsule 3  . diclofenac Sodium (VOLTAREN) 1 % GEL Apply 1 application topically 4 (four) times daily. 100 g 3  . glucose blood (ONETOUCH VERIO) test strip Use to check blood sugars twice a day. Dx E11.9 50 each 11  . HYDROmorphone (DILAUDID) 2 MG tablet Take 2-3 tablets (4-6 mg total) by mouth every 6 (six) hours as needed for moderate pain or severe pain. (Patient taking differently: Take 2 mg by mouth daily.) 75 tablet 0  . LORazepam (ATIVAN) 2 MG tablet TAKE 1 TABLET EVERY 8 HOURS AS NEEDED FOR ANXIETY. 90 tablet 3  . metoprolol tartrate (LOPRESSOR) 25 MG tablet TAKE ONE-HALF TABLET (12.5 MG TOTAL) BY MOUTH 2 (TWO) TIMES DAILY 90 tablet 3  . mirtazapine (REMERON) 15 MG tablet TAKE 1 TABLET BY MOUTH AT BEDTIME AT 6-8 PM 90 tablet 3  . multivitamin-lutein (OCUVITE-LUTEIN) CAPS capsule Take 1 capsule by mouth daily.    [name] Delica Lancets 29B MISC Use to help check blood sugars once a day. Dx  E11.9 50 each 11  . repaglinide (PRANDIN) 2 MG tablet Take 1 tablet (2 mg total) by mouth 3 (three) times daily before meals. 270 tablet 3  . tamsulosin (FLOMAX) 0.4 MG CAPS capsule Take 1 capsule (0.4 mg total) by mouth daily. 90 capsule 3   No facility-administered medications prior to visit.    ROS: Review of Systems  Constitutional: Negative for activity change, appetite change, chills, fatigue and unexpected weight change.  HENT: Negative for congestion, mouth sores and sinus pressure.   Eyes: Negative for visual disturbance.  Respiratory: Negative for cough and chest tightness.   Gastrointestinal: Negative for abdominal pain and nausea.  Genitourinary: Negative for difficulty urinating, frequency and vaginal pain.  Musculoskeletal: Positive for arthralgias, gait problem and neck pain. Negative for back pain.  Skin: Negative for pallor and rash.  Neurological: Positive for weakness. Negative for dizziness, tremors, numbness and headaches.  Psychiatric/Behavioral: Negative for confusion and sleep disturbance. The patient is nervous/anxious.     Objective:  BP 112/60 (BP Location: Left Arm)   Pulse 72   Temp 98 F (36.7 C) (Oral)   SpO2 94%   BP Readings from Last 3 Encounters:  11/28/20 112/60  11/14/20 120/70  10/03/20 120/70    Wt Readings from Last 3 Encounters:  10/03/20 148 lb (67.1 kg)  06/28/20  149 lb (67.6 kg)  03/16/20 153 lb (69.4 kg)    Physical Exam Constitutional:      Appearance: Normal appearance.  Musculoskeletal:        General: Tenderness present. No deformity.  Skin:    Findings: No bruising, erythema or lesion.  Neurological:     Mental Status: Mental status is at baseline.   in a w/c R thumb is triggering, painful  Lab Results  Component Value Date   WBC 5.5 07/17/2019   HGB 12.6 07/17/2019   HCT 42.6 07/17/2019   PLT 172 07/17/2019   GLUCOSE 245 (H) 10/03/2020   CHOL 213 (H) 04/16/2017   TRIG 183.0 (H) 04/16/2017   HDL 93.40  04/16/2017   LDLDIRECT 100.8 10/26/2013   LDLCALC 83 04/16/2017   ALT 3 10/03/2020   AST 10 10/03/2020   NA 134 (L) 10/03/2020   K 4.6 10/03/2020   CL 99 10/03/2020   CREATININE 0.68 10/03/2020   BUN 14 10/03/2020   CO2 28 10/03/2020   TSH 1.92 03/20/2018   INR 1.12 11/08/2016   HGBA1C 10.0 (H) 10/03/2020    DG Chest 2 View  Result Date: 07/16/2019 CLINICAL DATA:  Shortness of breath EXAM: CHEST - 2 VIEW COMPARISON:  08/19/2018 FINDINGS: Normal heart size and mediastinal contours. No acute infiltrate or edema. No effusion or pneumothorax. No acute osseous findings. Remote left humerus fracture repair. Postoperative left upper quadrant. IMPRESSION: No evidence of active disease Electronically Signed   By: Monte Fantasia M.D.   On: 07/16/2019 04:06   ECHOCARDIOGRAM COMPLETE  Result Date: 07/17/2019   ECHOCARDIOGRAM REPORT   Patient Name:   [name] Date of Exam: 07/17/2019 Medical Rec #:  [id number]    Height:       65.0 in Accession #:    [id number]   Weight:       165.0 lb Date of Birth:  09-21-1951   BSA:          1.82 m Patient Age:    58 years     BP:           137/68 mmHg Patient Gender: F            HR:           69 bpm. Exam Location:  Inpatient Procedure: 2D Echo, Color Doppler and Cardiac Doppler Indications:    R06.9 DOE  History:        Patient has prior history of Echocardiogram examinations, most                 recent 07/19/2016. COPD Risk Factors:Diabetes and Dyslipidemia.  Sonographer:    Raquel Sarna Senior RDCS Referring Phys: [id number] [place]  1. Left ventricular ejection fraction, by visual estimation, is 55 to 60%. The left ventricle has normal function. There is no left ventricular hypertrophy.  2. Left ventricular diastolic Doppler parameters are consistent with pseudonormalization pattern of LV diastolic filling.  3. Global right ventricle has normal systolic function.The right ventricular size is normal. No increase in right ventricular wall  thickness.  4. Left atrial size was normal.  5. Right atrial size was normal.  6. The mitral valve is normal in structure. Trace mitral valve regurgitation.  7. The tricuspid valve is normal in structure. Tricuspid valve regurgitation was not visualized by color flow Doppler.  8. The aortic valve is normal in structure. Aortic valve regurgitation was not visualized by color flow Doppler. Structurally normal aortic valve, with no  evidence of sclerosis or stenosis.  9. The pulmonic valve was normal in structure. Pulmonic valve regurgitation is not visualized by color flow Doppler. 10. Mildly elevated pulmonary artery systolic pressure. 11. The atrial septum is grossly normal. FINDINGS  Left Ventricle: Left ventricular ejection fraction, by visual estimation, is 55 to 60%. The left ventricle has normal function. There is no left ventricular hypertrophy. Spectral Doppler shows Left ventricular diastolic Doppler parameters are consistent  with pseudonormalization pattern of LV diastolic filling. Right Ventricle: The right ventricular size is normal. No increase in right ventricular wall thickness. Global RV systolic function is has normal systolic function. The tricuspid regurgitant velocity is 2.93 m/s, and with an assumed right atrial pressure  of 3 mmHg, the estimated right ventricular systolic pressure is mildly elevated at 37.3 mmHg. Left Atrium: Left atrial size was normal in size. Right Atrium: Right atrial size was normal in size Pericardium: There is no evidence of pericardial effusion. Mitral Valve: The mitral valve is normal in structure. Trace mitral valve regurgitation. Tricuspid Valve: The tricuspid valve is normal in structure. Tricuspid valve regurgitation was not visualized by color flow Doppler. Aortic Valve: The aortic valve is normal in structure. Aortic valve regurgitation was not visualized by color flow Doppler. The aortic valve is structurally normal, with no evidence of sclerosis or stenosis.  Pulmonic Valve: The pulmonic valve was normal in structure. Pulmonic valve regurgitation is not visualized by color flow Doppler. Aorta: The aortic root and ascending aorta are structurally normal, with no evidence of dilitation. IAS/Shunts: The atrial septum is grossly normal.  LEFT VENTRICLE PLAX 2D LVIDd:         3.90 cm       Diastology LVIDs:         2.80 cm       LV e' lateral:   8.59 cm/s LV PW:         0.90 cm       LV E/e' lateral: 6.8 LV IVS:        0.90 cm       LV e' medial:    5.00 cm/s LVOT diam:     2.00 cm       LV E/e' medial:  11.7 LV SV:         36 ml LV SV Index:   19.44 LVOT Area:     3.14 cm  LV Volumes (MOD) LV area d, A2C:    21.20 cm LV area d, A4C:    18.90 cm LV area s, A2C:    13.40 cm LV area s, A4C:    11.90 cm LV major d, A2C:   6.78 cm LV major d, A4C:   6.04 cm LV major s, A2C:   5.82 cm LV major s, A4C:   5.28 cm LV vol d, MOD A2C: 55.5 ml LV vol d, MOD A4C: 49.0 ml LV vol s, MOD A2C: 27.6 ml LV vol s, MOD A4C: 22.8 ml LV SV MOD A2C:     27.9 ml LV SV MOD A4C:     49.0 ml LV SV MOD BP:      29.1 ml RIGHT VENTRICLE RV S prime:     9.68 cm/s TAPSE (M-mode): 1.8 cm LEFT ATRIUM             Index LA diam:        2.10 cm 1.15 cm/m LA Vol (A2C):   34.2 ml 18.76 ml/m LA Vol (A4C):   27.5 ml 15.09 ml/m  LA Biplane Vol: 32.2 ml 17.66 ml/m  AORTIC VALVE LVOT Vmax:   67.90 cm/s LVOT Vmean:  46.400 cm/s LVOT VTI:    0.151 m  AORTA Ao Root diam: 3.10 cm Ao Asc diam:  3.20 cm MITRAL VALVE                        TRICUSPID VALVE MV Area (PHT): 4.17 cm             TR Peak grad:   34.3 mmHg MV PHT:        52.78 msec           TR Vmax:        293.00 cm/s MV Decel Time: 182 msec MV E velocity: 58.30 cm/s 103 cm/s  SHUNTS MV A velocity: 52.70 cm/s 70.3 cm/s Systemic VTI:  0.15 m MV E/A ratio:  1.11       1.5       Systemic Diam: 2.00 cm  Mertie Moores MD Electronically signed by Mertie Moores MD Signature Date/Time: 07/17/2019/11:37:13 AM    Final     Assessment & Plan:    Walker Kehr,  MD

## 2020-11-28 NOTE — Assessment & Plan Note (Signed)
Not better. Pt declined steroid inj  Trigger Thumb Splint Voltaren gel Ortho ref - Dr Roda Shutters

## 2020-12-06 ENCOUNTER — Other Ambulatory Visit: Payer: Self-pay

## 2020-12-06 ENCOUNTER — Ambulatory Visit (INDEPENDENT_AMBULATORY_CARE_PROVIDER_SITE_OTHER): Payer: Medicare Other

## 2020-12-06 ENCOUNTER — Encounter: Payer: Self-pay | Admitting: Orthopaedic Surgery

## 2020-12-06 ENCOUNTER — Ambulatory Visit (INDEPENDENT_AMBULATORY_CARE_PROVIDER_SITE_OTHER): Payer: Medicare Other | Admitting: Orthopaedic Surgery

## 2020-12-06 DIAGNOSIS — M542 Cervicalgia: Secondary | ICD-10-CM

## 2020-12-06 DIAGNOSIS — G8929 Other chronic pain: Secondary | ICD-10-CM

## 2020-12-06 DIAGNOSIS — M79602 Pain in left arm: Secondary | ICD-10-CM

## 2020-12-06 DIAGNOSIS — M65311 Trigger thumb, right thumb: Secondary | ICD-10-CM | POA: Diagnosis not present

## 2020-12-06 DIAGNOSIS — M25512 Pain in left shoulder: Secondary | ICD-10-CM | POA: Diagnosis not present

## 2020-12-06 MED ORDER — BUPIVACAINE HCL 0.25 % IJ SOLN
2.0000 mL | INTRAMUSCULAR | Status: AC | PRN
  Administered 2020-12-06: 2 mL via INTRA_ARTICULAR

## 2020-12-06 MED ORDER — METHYLPREDNISOLONE ACETATE 40 MG/ML IJ SUSP
13.3300 mg | INTRAMUSCULAR | Status: AC | PRN
  Administered 2020-12-06: 13.33 mg

## 2020-12-06 MED ORDER — LIDOCAINE HCL 1 % IJ SOLN
1.0000 mL | INTRAMUSCULAR | Status: AC | PRN
  Administered 2020-12-06: 1 mL

## 2020-12-06 MED ORDER — LIDOCAINE HCL 2 % IJ SOLN
2.0000 mL | INTRAMUSCULAR | Status: AC | PRN
  Administered 2020-12-06: 2 mL

## 2020-12-06 MED ORDER — METHYLPREDNISOLONE ACETATE 40 MG/ML IJ SUSP
40.0000 mg | INTRAMUSCULAR | Status: AC | PRN
  Administered 2020-12-06: 40 mg via INTRA_ARTICULAR

## 2020-12-06 MED ORDER — BUPIVACAINE HCL 0.25 % IJ SOLN
0.3300 mL | INTRAMUSCULAR | Status: AC | PRN
  Administered 2020-12-06: .33 mL

## 2020-12-06 NOTE — Progress Notes (Signed)
Office Visit Note   Patient: [name]           Date of Birth: 10/20/1950           MRN: [id number] Visit Date: 12/06/2020              Requested by: Tresa Garter, MD [address] [place],  [place] [address] PCP: Plotnikov, Georgina Quint, MD   Assessment & Plan: Visit Diagnoses:  1. Neck pain   2. Left arm pain   3. Chronic left shoulder pain   4. Trigger thumb, right thumb     Plan: Impression is right trigger thumb and left shoulder pain likely from underlying subacromial bursitis.  We have discussed cortisone injection versus oral anti-inflammatories.  She would like to proceed with injection today.  In regards to the trigger thumb, she would like to have this injected today as well.  She will follow up with [place] as needed.  Follow-Up Instructions: Return if symptoms worsen or fail to improve.   Orders:  Orders Placed This Encounter  Procedures  . Large Joint Inj: L subacromial bursa  . Hand/UE Inj: R thumb A1  . XR Cervical Spine 2 or 3 views  . XR Humerus Left   No orders of the defined types were placed in this encounter.     Procedures: Large Joint Inj: L subacromial bursa on 12/06/2020 3:18 PM Indications: pain Details: 22 G needle Medications: 2 mL lidocaine 2 %; 2 mL bupivacaine 0.25 %; 40 mg methylPREDNISolone acetate 40 MG/ML Outcome: tolerated well, no immediate complications Patient was prepped and draped in the usual sterile fashion.   Hand/UE Inj: R thumb A1 for trigger finger on 12/06/2020 3:19 PM Indications: pain Details: 25 G needle Medications: 1 mL lidocaine 1 %; 0.33 mL bupivacaine 0.25 %; 13.33 mg methylPREDNISolone acetate 40 MG/ML      Clinical Data: No additional findings.   Subjective: Chief Complaint  Patient presents with  . Right Thumb - Pain  . Neck - Pain    HPI patient is a pleasant 70 year old female who is here today with her husband.  She is complaining of right thumb trigger finger as well as left arm pain.   In regards to the thumb, she has had pain and triggering here for the past few months.  She has been wearing a brace which does seem to help.  Other issue she brings up is left arm pain.  She has had pain primarily to the humerus but into the anterior shoulder for the past month without any specific injury or change in activity.  She does note that she is about 7 years out ORIF left proximal humerus fracture.  Doing well until recently.  The pain she has is throughout the entire arm but primarily into the deltoid.  Worse with lying on her left side.  She denies any paresthesias to the left upper extremity.  Review of Systems as detailed in HPI.  All others reviewed and are negative.   Objective: Vital Signs: There were no vitals taken for this visit.  Physical Exam well-developed well-nourished female no acute distress.  Alert and oriented x3.  Ortho Exam right thumb exam shows tenderness to the A1 pulley.  No reproducible triggering.  She is neurovascular intact distally.  Left shoulder exam shows near full forward flexion and abduction.  She does have limitation with external rotation.  Cervical spine exam shows no spinous tenderness.  No paraspinous tenderness.  She has  increased pain with left-sided rotation.  She is neurovascular intact distally.  Specialty Comments:  No specialty comments available.  Imaging: XR Cervical Spine 2 or 3 views  Result Date: 12/06/2020 Advanced multilevel spondylosis  XR Humerus Left  Result Date: 12/06/2020 X-rays demonstrate no acute findings.  Previous ORIF left humeral shaft fracture without hardware complication.    PMFS History: Patient Active Problem List   Diagnosis Date Noted  . Trigger thumb of right hand 11/14/2020  . Physical deconditioning 07/17/2019  . Acute respiratory failure with hypoxia (HCC) 07/16/2019  . Knee pain, left 10/21/2018  . Diabetic foot ulcer (HCC) 08/19/2018  . Weakness 10/04/2017  . Acute encephalopathy 10/04/2017   . Acute lower UTI 10/04/2017  . Aortic atherosclerosis (HCC) 10/04/2017  . Grief 09/23/2017  . Osteoporosis 04/16/2017  . Irregular heart beat 11/15/2016  . Hyperglycemia   . Oral thrush 07/18/2016  . Odynophagia 07/18/2016  . Anoxic brain injury (HCC) 12/29/2015  . Gait disorder 12/28/2015  . Well adult exam 12/08/2014  . Fracture of left humerus 06/24/2014  . Humerus fracture 06/24/2014  . Left elbow pain 11/03/2013  . Ankle ulcer (HCC) 04/02/2012  . Decubitus ulcer of coccyx 05/21/2011  . B12 deficiency 04/04/2010  . HYPERLIPIDEMIA 04/04/2010  . Anxiety disorder 11/14/2009  . Vitamin D deficiency 10/21/2009  . DM2 (diabetes mellitus, type 2) (HCC) 07/07/2009  . COPD (chronic obstructive pulmonary disease) (HCC) 02/25/2009  . OSTEOARTHRITIS 02/25/2009  . LOW BACK PAIN 02/25/2009  . TOBACCO USER 01/13/2009  . DEPRESSION 02/26/2007  . PAIN, CHRONIC NEC 02/26/2007  . Venous (peripheral) insufficiency 02/26/2007  . GERD 02/26/2007  . HERNIA, INCISIONAL 02/26/2007  . SHOULDER PAIN, LEFT 02/26/2007  . 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 INSOMNIA 02/26/2007  . Abnormality of gait 02/26/2007  . LEG EDEMA, CHRONIC 02/26/2007   Past Medical History:  Diagnosis Date  . Anoxic brain injury (HCC)   . Anxiety   . Aortic atherosclerosis (HCC) 10/04/2017  . Cellulitis and abscess of other specified site 10/11/2010   Qualifier: Diagnosis of  By: Plotnikov MD, Georgina Quint   . Chronic neck pain   . Chronic pain in shoulder   . COPD (chronic obstructive pulmonary disease) (HCC)   . Depression   . Fracture of left humerus 06/24/2014  . Gait disorder   . GERD (gastroesophageal reflux disease)   . Hernia of abdominal cavity   . 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 Hyperlipidemia   . LBP (low back pain)   . Neuropathy   . Osteoarthritis   . Seizure disorder (HCC)   . Seizures (HCC)   . Type II or unspecified  type diabetes mellitus without mention of complication, not stated as uncontrolled 2010  . UTI (urinary tract infection) 10/2017  . Vitamin B 12 deficiency 2011    Family History  Problem Relation Age of Onset  . Stroke Mother   . Heart disease Father 44       MI  . Hypertension Other     Past Surgical History:  Procedure Laterality Date  . COLONOSCOPY    . DIAGNOSTIC LAPAROSCOPY     PMH: Exploratory lap  . HERNIA REPAIR     incisional  . ORIF HUMERUS FRACTURE Left 06/24/2014   Procedure: LEFT OPEN REDUCTION INTERNAL FIXATION (ORIF) PROXIMAL HUMERUS FRACTURE;  Surgeon: Eulas Post, MD;  Location: [facility] OR;  Service: Orthopedics;  Laterality: Left;  . PANCREATECTOMY    .  TONSILLECTOMY    . TUBAL LIGATION     Social History   Occupational History  . Occupation: disabled  Tobacco Use  . Smoking status: Former Smoker    Packs/day: 1.00    Years: 20.00    Pack years: 20.00    Types: Cigarettes    Quit date: 07/18/2016    Years since quitting: 4.3  . Smokeless tobacco: Never Used  Vaping Use  . Vaping Use: Never used  Substance and Sexual Activity  . Alcohol use: Yes    Alcohol/week: 10.0 standard drinks    Types: 10 Glasses of wine per week    Comment: 1 glass of wine daily  . Drug use: No  . Sexual activity: Yes

## 2020-12-14 DIAGNOSIS — M25512 Pain in left shoulder: Secondary | ICD-10-CM | POA: Diagnosis not present

## 2020-12-14 DIAGNOSIS — M47816 Spondylosis without myelopathy or radiculopathy, lumbar region: Secondary | ICD-10-CM | POA: Diagnosis not present

## 2020-12-14 DIAGNOSIS — Z79891 Long term (current) use of opiate analgesic: Secondary | ICD-10-CM | POA: Diagnosis not present

## 2020-12-14 DIAGNOSIS — G894 Chronic pain syndrome: Secondary | ICD-10-CM | POA: Diagnosis not present

## 2021-01-11 DIAGNOSIS — Z79891 Long term (current) use of opiate analgesic: Secondary | ICD-10-CM | POA: Diagnosis not present

## 2021-01-11 DIAGNOSIS — M654 Radial styloid tenosynovitis [de Quervain]: Secondary | ICD-10-CM | POA: Diagnosis not present

## 2021-01-11 DIAGNOSIS — G894 Chronic pain syndrome: Secondary | ICD-10-CM | POA: Diagnosis not present

## 2021-01-11 DIAGNOSIS — M47816 Spondylosis without myelopathy or radiculopathy, lumbar region: Secondary | ICD-10-CM | POA: Diagnosis not present

## 2021-01-26 ENCOUNTER — Ambulatory Visit (INDEPENDENT_AMBULATORY_CARE_PROVIDER_SITE_OTHER): Payer: Medicare Other | Admitting: Internal Medicine

## 2021-01-26 ENCOUNTER — Other Ambulatory Visit: Payer: Self-pay

## 2021-01-26 ENCOUNTER — Encounter: Payer: Self-pay | Admitting: Internal Medicine

## 2021-01-26 DIAGNOSIS — M25562 Pain in left knee: Secondary | ICD-10-CM

## 2021-01-26 DIAGNOSIS — E559 Vitamin D deficiency, unspecified: Secondary | ICD-10-CM | POA: Diagnosis not present

## 2021-01-26 DIAGNOSIS — E1100 Type 2 diabetes mellitus with hyperosmolarity without nonketotic hyperglycemic-hyperosmolar coma (NKHHC): Secondary | ICD-10-CM

## 2021-01-26 DIAGNOSIS — I700 Atherosclerosis of aorta: Secondary | ICD-10-CM

## 2021-01-26 DIAGNOSIS — E538 Deficiency of other specified B group vitamins: Secondary | ICD-10-CM | POA: Diagnosis not present

## 2021-01-26 LAB — BASIC METABOLIC PANEL
BUN: 14 mg/dL (ref 6–23)
CO2: 31 mEq/L (ref 19–32)
Calcium: 9.2 mg/dL (ref 8.4–10.5)
Chloride: 98 mEq/L (ref 96–112)
Creatinine, Ser: 0.74 mg/dL (ref 0.40–1.20)
GFR: 81.98 mL/min (ref >60.00)
Glucose, Bld: 289 mg/dL — ABNORMAL HIGH (ref 70–99)
Potassium: 4.4 mEq/L (ref 3.5–5.1)
Sodium: 135 mEq/L (ref 135–145)

## 2021-01-26 LAB — HEMOGLOBIN A1C: Hgb A1c MFr Bld: 10.2 % — ABNORMAL HIGH (ref 4.6–6.5)

## 2021-01-26 NOTE — Assessment & Plan Note (Addendum)
Poor control - ?better (better per pt) Check A1c Cont w/ Prandin, Metformin

## 2021-01-26 NOTE — Assessment & Plan Note (Signed)
On ASA, diet

## 2021-01-26 NOTE — Addendum Note (Signed)
Addended by: Waldemar Dickens B on: 01/26/2021 01:45 PM   Modules accepted: Orders

## 2021-01-26 NOTE — Assessment & Plan Note (Signed)
On Vit D 

## 2021-01-26 NOTE — Assessment & Plan Note (Signed)
Not better. Use Voltaren gel

## 2021-01-26 NOTE — Assessment & Plan Note (Signed)
On B12 

## 2021-01-26 NOTE — Progress Notes (Signed)
Subjective:  Patient ID: [name], female    DOB: 08/27/1951  Age: 71 y.o. MRN: [id number]  CC: Follow-up (2 month f/u)   HPI [name] presents for DM, chronic pain, anxiety, RLS f/u, Vit D def  Outpatient Medications Prior to Visit  Medication Sig Dispense Refill  . albuterol (VENTOLIN HFA) 108 (90 Base) MCG/ACT inhaler Inhale 1-2 puffs into the lungs every 4 (four) hours as needed for wheezing or shortness of breath. 56 g 3  . aspirin 81 MG EC tablet Take 81 mg by mouth daily.    . Blood Glucose Monitoring Suppl (ONETOUCH VERIO) w/Device KIT 1 Units by Does not apply route daily as needed. 1 kit 1  . carbidopa-levodopa (SINEMET IR) 25-250 MG tablet Take 1 tablet by mouth 3 (three) times daily. Patient needs office visit before refills will be given 270 tablet 3  . Cholecalciferol (VITAMIN D3) 1.25 MG (50000 UT) CAPS Take 1 capsule by mouth once a week. 8 capsule 0  . Cholecalciferol (VITAMIN D3) 50 MCG (2000 UT) capsule Take 1 capsule (2,000 Units total) by mouth daily. 100 capsule 3  . diclofenac Sodium (VOLTAREN) 1 % GEL Apply 1 application topically 4 (four) times daily. 100 g 3  . glucose blood (ONETOUCH VERIO) test strip Use to check blood sugars twice a day. Dx E11.9 50 each 11  . HYDROmorphone (DILAUDID) 2 MG tablet Take 2-3 tablets (4-6 mg total) by mouth every 6 (six) hours as needed for moderate pain or severe pain. (Patient taking differently: Take 2 mg by mouth daily.) 75 tablet 0  . LORazepam (ATIVAN) 2 MG tablet TAKE 1 TABLET EVERY 8 HOURS AS NEEDED FOR ANXIETY. 90 tablet 3  . metoprolol tartrate (LOPRESSOR) 25 MG tablet TAKE ONE-HALF TABLET (12.5 MG TOTAL) BY MOUTH 2 (TWO) TIMES DAILY 90 tablet 3  . mirtazapine (REMERON) 15 MG tablet TAKE 1 TABLET BY MOUTH AT BEDTIME AT 6-8 PM 90 tablet 3  . multivitamin-lutein (OCUVITE-LUTEIN) CAPS capsule Take 1 capsule by mouth daily.    [name] Delica Lancets 26R MISC Use to help check blood sugars once a day. Dx E11.9 50  each 11  . repaglinide (PRANDIN) 2 MG tablet Take 1 tablet (2 mg total) by mouth 3 (three) times daily before meals. 270 tablet 3  . tamsulosin (FLOMAX) 0.4 MG CAPS capsule Take 1 capsule (0.4 mg total) by mouth daily. 90 capsule 3   No facility-administered medications prior to visit.    ROS: Review of Systems  Constitutional: Negative for activity change, appetite change, chills, fatigue and unexpected weight change.  HENT: Negative for congestion, mouth sores and sinus pressure.   Eyes: Negative for visual disturbance.  Respiratory: Negative for cough and chest tightness.   Gastrointestinal: Negative for abdominal pain and nausea.  Genitourinary: Negative for difficulty urinating, frequency and vaginal pain.  Musculoskeletal: Positive for arthralgias, gait problem and neck stiffness. Negative for back pain.  Skin: Negative for pallor and rash.  Neurological: Negative for dizziness, tremors, weakness, numbness and headaches.  Psychiatric/Behavioral: Positive for decreased concentration. Negative for confusion, sleep disturbance and suicidal ideas. The patient is nervous/anxious.     Objective:  BP 132/70 (BP Location: Left Arm)   Pulse 66   Temp 98.2 F (36.8 C) (Oral)   Wt 146 lb 9.6 oz (66.5 kg)   SpO2 92%   BMI 24.40 kg/m   BP Readings from Last 3 Encounters:  01/26/21 132/70  11/28/20 112/60  11/14/20 120/70  Wt Readings from Last 3 Encounters:  01/26/21 146 lb 9.6 oz (66.5 kg)  10/03/20 148 lb (67.1 kg)  06/28/20 149 lb (67.6 kg)    Physical Exam Constitutional:      General: She is not in acute distress.    Appearance: She is well-developed.  HENT:     Head: Normocephalic.     Right Ear: External ear normal.     Left Ear: External ear normal.     Nose: Nose normal.  Eyes:     General:        Right eye: No discharge.        Left eye: No discharge.     Conjunctiva/sclera: Conjunctivae normal.     Pupils: Pupils are equal, round, and reactive to light.   Neck:     Thyroid: No thyromegaly.     Vascular: No JVD.     Trachea: No tracheal deviation.  Cardiovascular:     Rate and Rhythm: Normal rate and regular rhythm.     Heart sounds: Normal heart sounds.  Pulmonary:     Effort: No respiratory distress.     Breath sounds: No stridor. No wheezing.  Abdominal:     General: Bowel sounds are normal. There is no distension.     Palpations: Abdomen is soft. There is no mass.     Tenderness: There is no abdominal tenderness. There is no guarding or rebound.  Musculoskeletal:        General: No tenderness.     Cervical back: Normal range of motion and neck supple.  Lymphadenopathy:     Cervical: No cervical adenopathy.  Skin:    Findings: No erythema or rash.  Neurological:     Cranial Nerves: No cranial nerve deficit.     Motor: Weakness present. No abnormal muscle tone.     Coordination: Coordination abnormal.     Gait: Gait abnormal.     Deep Tendon Reflexes: Reflexes normal.  Psychiatric:        Behavior: Behavior normal.        Thought Content: Thought content normal.        Judgment: Judgment normal.    In a w/c Lab Results  Component Value Date   WBC 5.5 07/17/2019   HGB 12.6 07/17/2019   HCT 42.6 07/17/2019   PLT 172 07/17/2019   GLUCOSE 245 (H) 10/03/2020   CHOL 213 (H) 04/16/2017   TRIG 183.0 (H) 04/16/2017   HDL 93.40 04/16/2017   LDLDIRECT 100.8 10/26/2013   LDLCALC 83 04/16/2017   ALT 3 10/03/2020   AST 10 10/03/2020   NA 134 (L) 10/03/2020   K 4.6 10/03/2020   CL 99 10/03/2020   CREATININE 0.68 10/03/2020   BUN 14 10/03/2020   CO2 28 10/03/2020   TSH 1.92 03/20/2018   INR 1.12 11/08/2016   HGBA1C 10.0 (H) 10/03/2020    DG Chest 2 View  Result Date: 07/16/2019 CLINICAL DATA:  Shortness of breath EXAM: CHEST - 2 VIEW COMPARISON:  08/19/2018 FINDINGS: Normal heart size and mediastinal contours. No acute infiltrate or edema. No effusion or pneumothorax. No acute osseous findings. Remote left humerus  fracture repair. Postoperative left upper quadrant. IMPRESSION: No evidence of active disease Electronically Signed   By: Monte Fantasia M.D.   On: 07/16/2019 04:06   ECHOCARDIOGRAM COMPLETE  Result Date: 07/17/2019   ECHOCARDIOGRAM REPORT   Patient Name:   [name] Date of Exam: 07/17/2019 Medical Rec #:  [id number]    Height:  65.0 in Accession #:    [id number]   Weight:       165.0 lb Date of Birth:  02/20/1951   BSA:          1.82 m Patient Age:    63 years     BP:           137/68 mmHg Patient Gender: F            HR:           69 bpm. Exam Location:  Inpatient Procedure: 2D Echo, Color Doppler and Cardiac Doppler Indications:    R06.9 DOE  History:        Patient has prior history of Echocardiogram examinations, most                 recent 07/19/2016. COPD Risk Factors:Diabetes and Dyslipidemia.  Sonographer:    Raquel Sarna Senior RDCS Referring Phys: [id number] [employer]  1. Left ventricular ejection fraction, by visual estimation, is 55 to 60%. The left ventricle has normal function. There is no left ventricular hypertrophy.  2. Left ventricular diastolic Doppler parameters are consistent with pseudonormalization pattern of LV diastolic filling.  3. Global right ventricle has normal systolic function.The right ventricular size is normal. No increase in right ventricular wall thickness.  4. Left atrial size was normal.  5. Right atrial size was normal.  6. The mitral valve is normal in structure. Trace mitral valve regurgitation.  7. The tricuspid valve is normal in structure. Tricuspid valve regurgitation was not visualized by color flow Doppler.  8. The aortic valve is normal in structure. Aortic valve regurgitation was not visualized by color flow Doppler. Structurally normal aortic valve, with no evidence of sclerosis or stenosis.  9. The pulmonic valve was normal in structure. Pulmonic valve regurgitation is not visualized by color flow Doppler. 10. Mildly elevated pulmonary  artery systolic pressure. 11. The atrial septum is grossly normal. FINDINGS  Left Ventricle: Left ventricular ejection fraction, by visual estimation, is 55 to 60%. The left ventricle has normal function. There is no left ventricular hypertrophy. Spectral Doppler shows Left ventricular diastolic Doppler parameters are consistent  with pseudonormalization pattern of LV diastolic filling. Right Ventricle: The right ventricular size is normal. No increase in right ventricular wall thickness. Global RV systolic function is has normal systolic function. The tricuspid regurgitant velocity is 2.93 m/s, and with an assumed right atrial pressure  of 3 mmHg, the estimated right ventricular systolic pressure is mildly elevated at 37.3 mmHg. Left Atrium: Left atrial size was normal in size. Right Atrium: Right atrial size was normal in size Pericardium: There is no evidence of pericardial effusion. Mitral Valve: The mitral valve is normal in structure. Trace mitral valve regurgitation. Tricuspid Valve: The tricuspid valve is normal in structure. Tricuspid valve regurgitation was not visualized by color flow Doppler. Aortic Valve: The aortic valve is normal in structure. Aortic valve regurgitation was not visualized by color flow Doppler. The aortic valve is structurally normal, with no evidence of sclerosis or stenosis. Pulmonic Valve: The pulmonic valve was normal in structure. Pulmonic valve regurgitation is not visualized by color flow Doppler. Aorta: The aortic root and ascending aorta are structurally normal, with no evidence of dilitation. IAS/Shunts: The atrial septum is grossly normal.  LEFT VENTRICLE PLAX 2D LVIDd:         3.90 cm       Diastology LVIDs:         2.80 cm  LV e' lateral:   8.59 cm/s LV PW:         0.90 cm       LV E/e' lateral: 6.8 LV IVS:        0.90 cm       LV e' medial:    5.00 cm/s LVOT diam:     2.00 cm       LV E/e' medial:  11.7 LV SV:         36 ml LV SV Index:   19.44 LVOT Area:     3.14  cm  LV Volumes (MOD) LV area d, A2C:    21.20 cm LV area d, A4C:    18.90 cm LV area s, A2C:    13.40 cm LV area s, A4C:    11.90 cm LV major d, A2C:   6.78 cm LV major d, A4C:   6.04 cm LV major s, A2C:   5.82 cm LV major s, A4C:   5.28 cm LV vol d, MOD A2C: 55.5 ml LV vol d, MOD A4C: 49.0 ml LV vol s, MOD A2C: 27.6 ml LV vol s, MOD A4C: 22.8 ml LV SV MOD A2C:     27.9 ml LV SV MOD A4C:     49.0 ml LV SV MOD BP:      29.1 ml RIGHT VENTRICLE RV S prime:     9.68 cm/s TAPSE (M-mode): 1.8 cm LEFT ATRIUM             Index LA diam:        2.10 cm 1.15 cm/m LA Vol (A2C):   34.2 ml 18.76 ml/m LA Vol (A4C):   27.5 ml 15.09 ml/m LA Biplane Vol: 32.2 ml 17.66 ml/m  AORTIC VALVE LVOT Vmax:   67.90 cm/s LVOT Vmean:  46.400 cm/s LVOT VTI:    0.151 m  AORTA Ao Root diam: 3.10 cm Ao Asc diam:  3.20 cm MITRAL VALVE                        TRICUSPID VALVE MV Area (PHT): 4.17 cm             TR Peak grad:   34.3 mmHg MV PHT:        52.78 msec           TR Vmax:        293.00 cm/s MV Decel Time: 182 msec MV E velocity: 58.30 cm/s 103 cm/s  SHUNTS MV A velocity: 52.70 cm/s 70.3 cm/s Systemic VTI:  0.15 m MV E/A ratio:  1.11       1.5       Systemic Diam: 2.00 cm  Mertie Moores MD Electronically signed by Mertie Moores MD Signature Date/Time: 07/17/2019/11:37:13 AM    Final     Assessment & Plan:   There are no diagnoses linked to this encounter.   No orders of the defined types were placed in this encounter.    Follow-up: No follow-ups on file.  Walker Kehr, MD

## 2021-01-27 ENCOUNTER — Other Ambulatory Visit: Payer: Self-pay | Admitting: Internal Medicine

## 2021-01-27 ENCOUNTER — Telehealth: Payer: Self-pay | Admitting: Internal Medicine

## 2021-01-27 MED ORDER — METFORMIN HCL 500 MG PO TABS: 500.0000 mg | ORAL_TABLET | Freq: Two times a day (BID) | ORAL | 3 refills | Status: DC

## 2021-01-27 MED ORDER — REPAGLINIDE 2 MG PO TABS: 4.0000 mg | ORAL_TABLET | Freq: Three times a day (TID) | ORAL | 3 refills | Status: DC

## 2021-01-27 NOTE — Telephone Encounter (Signed)
   [facility] calling to verify instructions/ increase of repaglinide (PRANDIN) 2 MG tablet  Please call

## 2021-01-30 NOTE — Telephone Encounter (Signed)
Called [place] spoke w/Wendy just wanted to make sure that the new rx Prandin was correct. Inform Toniann Fail yes after labs came back MD made the change.Marland KitchenRaechel Chute

## 2021-02-08 DIAGNOSIS — M654 Radial styloid tenosynovitis [de Quervain]: Secondary | ICD-10-CM | POA: Diagnosis not present

## 2021-02-08 DIAGNOSIS — M47816 Spondylosis without myelopathy or radiculopathy, lumbar region: Secondary | ICD-10-CM | POA: Diagnosis not present

## 2021-02-08 DIAGNOSIS — F32A Depression, unspecified: Secondary | ICD-10-CM | POA: Diagnosis not present

## 2021-02-08 DIAGNOSIS — G894 Chronic pain syndrome: Secondary | ICD-10-CM | POA: Diagnosis not present

## 2021-03-08 DIAGNOSIS — M47816 Spondylosis without myelopathy or radiculopathy, lumbar region: Secondary | ICD-10-CM | POA: Diagnosis not present

## 2021-03-08 DIAGNOSIS — F32A Depression, unspecified: Secondary | ICD-10-CM | POA: Diagnosis not present

## 2021-03-08 DIAGNOSIS — Z79891 Long term (current) use of opiate analgesic: Secondary | ICD-10-CM | POA: Diagnosis not present

## 2021-03-08 DIAGNOSIS — R32 Unspecified urinary incontinence: Secondary | ICD-10-CM | POA: Diagnosis not present

## 2021-03-08 DIAGNOSIS — G894 Chronic pain syndrome: Secondary | ICD-10-CM | POA: Diagnosis not present

## 2021-03-30 ENCOUNTER — Encounter: Payer: Self-pay | Admitting: Internal Medicine

## 2021-04-06 ENCOUNTER — Other Ambulatory Visit: Payer: Self-pay | Admitting: Internal Medicine

## 2021-04-06 DIAGNOSIS — G894 Chronic pain syndrome: Secondary | ICD-10-CM | POA: Diagnosis not present

## 2021-04-06 DIAGNOSIS — M47816 Spondylosis without myelopathy or radiculopathy, lumbar region: Secondary | ICD-10-CM | POA: Diagnosis not present

## 2021-04-06 DIAGNOSIS — F32A Depression, unspecified: Secondary | ICD-10-CM | POA: Diagnosis not present

## 2021-04-06 DIAGNOSIS — R32 Unspecified urinary incontinence: Secondary | ICD-10-CM | POA: Diagnosis not present

## 2021-04-13 ENCOUNTER — Other Ambulatory Visit: Payer: Self-pay | Admitting: Internal Medicine

## 2021-04-27 ENCOUNTER — Ambulatory Visit (INDEPENDENT_AMBULATORY_CARE_PROVIDER_SITE_OTHER): Payer: Medicare Other | Admitting: Internal Medicine

## 2021-04-27 ENCOUNTER — Other Ambulatory Visit: Payer: Self-pay

## 2021-04-27 ENCOUNTER — Encounter: Payer: Self-pay | Admitting: Internal Medicine

## 2021-04-27 DIAGNOSIS — E559 Vitamin D deficiency, unspecified: Secondary | ICD-10-CM | POA: Diagnosis not present

## 2021-04-27 DIAGNOSIS — G931 Anoxic brain damage, not elsewhere classified: Secondary | ICD-10-CM

## 2021-04-27 DIAGNOSIS — E538 Deficiency of other specified B group vitamins: Secondary | ICD-10-CM

## 2021-04-27 DIAGNOSIS — R269 Unspecified abnormalities of gait and mobility: Secondary | ICD-10-CM

## 2021-04-27 DIAGNOSIS — E1100 Type 2 diabetes mellitus with hyperosmolarity without nonketotic hyperglycemic-hyperosmolar coma (NKHHC): Secondary | ICD-10-CM

## 2021-04-27 LAB — COMPREHENSIVE METABOLIC PANEL
ALT: 4 U/L (ref 0–35)
AST: 13 U/L (ref 0–37)
Albumin: 4.1 g/dL (ref 3.5–5.2)
Alkaline Phosphatase: 61 U/L (ref 39–117)
BUN: 13 mg/dL (ref 6–23)
CO2: 27 mEq/L (ref 19–32)
Calcium: 9.6 mg/dL (ref 8.4–10.5)
Chloride: 100 mEq/L (ref 96–112)
Creatinine, Ser: 0.62 mg/dL (ref 0.40–1.20)
GFR: 90.08 mL/min (ref >60.00)
Glucose, Bld: 167 mg/dL — ABNORMAL HIGH (ref 70–99)
Potassium: 4 mEq/L (ref 3.5–5.1)
Sodium: 136 mEq/L (ref 135–145)
Total Bilirubin: 0.5 mg/dL (ref 0.2–1.2)
Total Protein: 6.9 g/dL (ref 6.0–8.3)

## 2021-04-27 LAB — HEMOGLOBIN A1C: Hgb A1c MFr Bld: 8.9 % — ABNORMAL HIGH (ref 4.6–6.5)

## 2021-04-27 NOTE — Assessment & Plan Note (Signed)
On Vit D 

## 2021-04-27 NOTE — Progress Notes (Signed)
Subjective:  Patient ID: [name], female    DOB: 22-Sep-1951  Age: 70 y.o. MRN: [id number]  CC: Follow-up (3 month f/u)   HPI [name] presents for DM, anxiety, chronic pain. Pt lost wt on diet, back on Metformin  Outpatient Medications Prior to Visit  Medication Sig Dispense Refill   albuterol (VENTOLIN HFA) 108 (90 Base) MCG/ACT inhaler Inhale 1-2 puffs into the lungs every 4 (four) hours as needed for wheezing or shortness of breath. 56 g 3   aspirin 81 MG EC tablet Take 81 mg by mouth daily.     Blood Glucose Monitoring Suppl (ONETOUCH VERIO) w/Device KIT 1 Units by Does not apply route daily as needed. 1 kit 1   carbidopa-levodopa (SINEMET IR) 25-250 MG tablet TAKE 1 TABLET BY MOUTH 3 TIMES A DAY. 270 tablet 3   Cholecalciferol (VITAMIN D3) 50 MCG (2000 UT) capsule Take 1 capsule (2,000 Units total) by mouth daily. 100 capsule 3   diclofenac Sodium (VOLTAREN) 1 % GEL Apply 1 application topically 4 (four) times daily. 100 g 3   glucose blood (ONETOUCH VERIO) test strip Use to check blood sugars twice a day. Dx E11.9 50 each 11   HYDROmorphone (DILAUDID) 2 MG tablet Take 2-3 tablets (4-6 mg total) by mouth every 6 (six) hours as needed for moderate pain or severe pain. (Patient taking differently: Take 2 mg by mouth daily.) 75 tablet 0   LORazepam (ATIVAN) 2 MG tablet TAKE 1 TABLET EVERY 8 HOURS AS NEEDED FOR ANXIETY. 90 tablet 3   metFORMIN (GLUCOPHAGE) 500 MG tablet Take 1 tablet (500 mg total) by mouth 2 (two) times daily with a meal. 180 tablet 3   metoprolol tartrate (LOPRESSOR) 25 MG tablet TAKE ONE-HALF TABLET (12.5 MG TOTAL) BY MOUTH 2 (TWO) TIMES DAILY 90 tablet 3   mirtazapine (REMERON) 15 MG tablet TAKE 1 TABLET BY MOUTH AT BEDTIME AT 6-8 PM 90 tablet 3   multivitamin-lutein (OCUVITE-LUTEIN) CAPS capsule Take 1 capsule by mouth daily.     OneTouch Delica Lancets 66Z MISC Use to help check blood sugars once a day. Dx E11.9 50 each 11   repaglinide (PRANDIN) 2 MG  tablet Take 2 tablets (4 mg total) by mouth 3 (three) times daily before meals. 360 tablet 3   tamsulosin (FLOMAX) 0.4 MG CAPS capsule Take 1 capsule (0.4 mg total) by mouth daily. 90 capsule 3   Cholecalciferol (VITAMIN D3) 1.25 MG (50000 UT) CAPS Take 1 capsule by mouth once a week. (Patient not taking: Reported on 04/27/2021) 8 capsule 0   No facility-administered medications prior to visit.    ROS: Review of Systems  Constitutional:  Positive for fatigue. Negative for activity change, appetite change, chills and unexpected weight change.  HENT:  Negative for congestion, mouth sores and sinus pressure.   Eyes:  Negative for visual disturbance.  Respiratory:  Negative for cough and chest tightness.   Gastrointestinal:  Negative for abdominal pain and nausea.  Genitourinary:  Negative for difficulty urinating, frequency and vaginal pain.  Musculoskeletal:  Positive for arthralgias and gait problem. Negative for back pain.  Skin:  Negative for pallor and rash.  Neurological:  Positive for weakness. Negative for dizziness, tremors, numbness and headaches.  Psychiatric/Behavioral:  Positive for decreased concentration. Negative for confusion, sleep disturbance and suicidal ideas. The patient is nervous/anxious.    Objective:  BP 130/70 (BP Location: Left Arm)   Pulse (!) 108   Temp 98.2 F (36.8 C) (Oral)  Ht '5\' 5"'  (1.651 m)   Wt 134 lb 6.4 oz (61 kg)   SpO2 90%   BMI 22.37 kg/m   BP Readings from Last 3 Encounters:  04/27/21 130/70  01/26/21 132/70  11/28/20 112/60    Wt Readings from Last 3 Encounters:  04/27/21 134 lb 6.4 oz (61 kg)  01/26/21 146 lb 9.6 oz (66.5 kg)  10/03/20 148 lb (67.1 kg)    Physical Exam Constitutional:      General: She is not in acute distress.    Appearance: She is well-developed.  HENT:     Head: Normocephalic.     Right Ear: External ear normal.     Left Ear: External ear normal.     Nose: Nose normal.  Eyes:     General:        Right  eye: No discharge.        Left eye: No discharge.     Conjunctiva/sclera: Conjunctivae normal.     Pupils: Pupils are equal, round, and reactive to light.  Neck:     Thyroid: No thyromegaly.     Vascular: No JVD.     Trachea: No tracheal deviation.  Cardiovascular:     Rate and Rhythm: Normal rate and regular rhythm.     Heart sounds: Normal heart sounds.  Pulmonary:     Effort: No respiratory distress.     Breath sounds: No stridor. No wheezing.  Abdominal:     General: Bowel sounds are normal. There is no distension.     Palpations: Abdomen is soft. There is no mass.     Tenderness: There is no abdominal tenderness. There is no guarding or rebound.  Musculoskeletal:        General: Tenderness present.     Cervical back: Normal range of motion and neck supple.  Lymphadenopathy:     Cervical: No cervical adenopathy.  Skin:    Findings: No erythema or rash.  Neurological:     Cranial Nerves: No cranial nerve deficit.     Motor: Weakness present. No abnormal muscle tone.     Coordination: Coordination abnormal.     Gait: Gait abnormal.     Deep Tendon Reflexes: Reflexes normal.  Psychiatric:        Behavior: Behavior normal.        Thought Content: Thought content normal.        Judgment: Judgment normal.   In a w/c Lab Results  Component Value Date   WBC 5.5 07/17/2019   HGB 12.6 07/17/2019   HCT 42.6 07/17/2019   PLT 172 07/17/2019   GLUCOSE 289 (H) 01/26/2021   CHOL 213 (H) 04/16/2017   TRIG 183.0 (H) 04/16/2017   HDL 93.40 04/16/2017   LDLDIRECT 100.8 10/26/2013   LDLCALC 83 04/16/2017   ALT 3 10/03/2020   AST 10 10/03/2020   NA 135 01/26/2021   K 4.4 01/26/2021   CL 98 01/26/2021   CREATININE 0.74 01/26/2021   BUN 14 01/26/2021   CO2 31 01/26/2021   TSH 1.92 03/20/2018   INR 1.12 11/08/2016   HGBA1C 10.2 (H) 01/26/2021    DG Chest 2 View  Result Date: 07/16/2019 CLINICAL DATA:  Shortness of breath EXAM: CHEST - 2 VIEW COMPARISON:  08/19/2018  FINDINGS: Normal heart size and mediastinal contours. No acute infiltrate or edema. No effusion or pneumothorax. No acute osseous findings. Remote left humerus fracture repair. Postoperative left upper quadrant. IMPRESSION: No evidence of active disease Electronically Signed   By:  Monte Fantasia M.D.   On: 07/16/2019 04:06   ECHOCARDIOGRAM COMPLETE  Result Date: 07/17/2019   ECHOCARDIOGRAM REPORT   Patient Name:   LETRICIA KRINSKY Date of Exam: 07/17/2019 Medical Rec #:  [id number]    Height:       65.0 in Accession #:    [id number]   Weight:       165.0 lb Date of Birth:  07/21/1951   BSA:          1.82 m Patient Age:    40 years     BP:           137/68 mmHg Patient Gender: F            HR:           69 bpm. Exam Location:  Inpatient Procedure: 2D Echo, Color Doppler and Cardiac Doppler Indications:    R06.9 DOE  History:        Patient has prior history of Echocardiogram examinations, most                 recent 07/19/2016. COPD Risk Factors:Diabetes and Dyslipidemia.  Sonographer:    Raquel Sarna Senior RDCS Referring Phys: [id number] [place]  1. Left ventricular ejection fraction, by visual estimation, is 55 to 60%. The left ventricle has normal function. There is no left ventricular hypertrophy.  2. Left ventricular diastolic Doppler parameters are consistent with pseudonormalization pattern of LV diastolic filling.  3. Global right ventricle has normal systolic function.The right ventricular size is normal. No increase in right ventricular wall thickness.  4. Left atrial size was normal.  5. Right atrial size was normal.  6. The mitral valve is normal in structure. Trace mitral valve regurgitation.  7. The tricuspid valve is normal in structure. Tricuspid valve regurgitation was not visualized by color flow Doppler.  8. The aortic valve is normal in structure. Aortic valve regurgitation was not visualized by color flow Doppler. Structurally normal aortic valve, with no evidence of sclerosis or  stenosis.  9. The pulmonic valve was normal in structure. Pulmonic valve regurgitation is not visualized by color flow Doppler. 10. Mildly elevated pulmonary artery systolic pressure. 11. The atrial septum is grossly normal. FINDINGS  Left Ventricle: Left ventricular ejection fraction, by visual estimation, is 55 to 60%. The left ventricle has normal function. There is no left ventricular hypertrophy. Spectral Doppler shows Left ventricular diastolic Doppler parameters are consistent  with pseudonormalization pattern of LV diastolic filling. Right Ventricle: The right ventricular size is normal. No increase in right ventricular wall thickness. Global RV systolic function is has normal systolic function. The tricuspid regurgitant velocity is 2.93 m/s, and with an assumed right atrial pressure  of 3 mmHg, the estimated right ventricular systolic pressure is mildly elevated at 37.3 mmHg. Left Atrium: Left atrial size was normal in size. Right Atrium: Right atrial size was normal in size Pericardium: There is no evidence of pericardial effusion. Mitral Valve: The mitral valve is normal in structure. Trace mitral valve regurgitation. Tricuspid Valve: The tricuspid valve is normal in structure. Tricuspid valve regurgitation was not visualized by color flow Doppler. Aortic Valve: The aortic valve is normal in structure. Aortic valve regurgitation was not visualized by color flow Doppler. The aortic valve is structurally normal, with no evidence of sclerosis or stenosis. Pulmonic Valve: The pulmonic valve was normal in structure. Pulmonic valve regurgitation is not visualized by color flow Doppler. Aorta: The aortic root and ascending aorta are structurally  normal, with no evidence of dilitation. IAS/Shunts: The atrial septum is grossly normal.  LEFT VENTRICLE PLAX 2D LVIDd:         3.90 cm       Diastology LVIDs:         2.80 cm       LV e' lateral:   8.59 cm/s LV PW:         0.90 cm       LV E/e' lateral: 6.8 LV IVS:         0.90 cm       LV e' medial:    5.00 cm/s LVOT diam:     2.00 cm       LV E/e' medial:  11.7 LV SV:         36 ml LV SV Index:   19.44 LVOT Area:     3.14 cm  LV Volumes (MOD) LV area d, A2C:    21.20 cm LV area d, A4C:    18.90 cm LV area s, A2C:    13.40 cm LV area s, A4C:    11.90 cm LV major d, A2C:   6.78 cm LV major d, A4C:   6.04 cm LV major s, A2C:   5.82 cm LV major s, A4C:   5.28 cm LV vol d, MOD A2C: 55.5 ml LV vol d, MOD A4C: 49.0 ml LV vol s, MOD A2C: 27.6 ml LV vol s, MOD A4C: 22.8 ml LV SV MOD A2C:     27.9 ml LV SV MOD A4C:     49.0 ml LV SV MOD BP:      29.1 ml RIGHT VENTRICLE RV S prime:     9.68 cm/s TAPSE (M-mode): 1.8 cm LEFT ATRIUM             Index LA diam:        2.10 cm 1.15 cm/m LA Vol (A2C):   34.2 ml 18.76 ml/m LA Vol (A4C):   27.5 ml 15.09 ml/m LA Biplane Vol: 32.2 ml 17.66 ml/m  AORTIC VALVE LVOT Vmax:   67.90 cm/s LVOT Vmean:  46.400 cm/s LVOT VTI:    0.151 m  AORTA Ao Root diam: 3.10 cm Ao Asc diam:  3.20 cm MITRAL VALVE                        TRICUSPID VALVE MV Area (PHT): 4.17 cm             TR Peak grad:   34.3 mmHg MV PHT:        52.78 msec           TR Vmax:        293.00 cm/s MV Decel Time: 182 msec MV E velocity: 58.30 cm/s 103 cm/s  SHUNTS MV A velocity: 52.70 cm/s 70.3 cm/s Systemic VTI:  0.15 m MV E/A ratio:  1.11       1.5       Systemic Diam: 2.00 cm  Mertie Moores MD Electronically signed by Mertie Moores MD Signature Date/Time: 07/17/2019/11:37:13 AM    Final     Assessment & Plan:     Walker Kehr, MD

## 2021-04-27 NOTE — Assessment & Plan Note (Signed)
On B12 

## 2021-04-27 NOTE — Addendum Note (Signed)
Addended by: Waldemar Dickens B on: 04/27/2021 02:31 PM   Modules accepted: Orders

## 2021-04-27 NOTE — Assessment & Plan Note (Signed)
In PT periodically

## 2021-04-27 NOTE — Assessment & Plan Note (Addendum)
Prandin, Metformin Hope A1c is better Labs ordered

## 2021-04-27 NOTE — Assessment & Plan Note (Signed)
Worse Will ref to Neurology

## 2021-04-29 ENCOUNTER — Other Ambulatory Visit: Payer: Self-pay | Admitting: Internal Medicine

## 2021-04-29 MED ORDER — DAPAGLIFLOZIN PROPANEDIOL 5 MG PO TABS: 5.0000 mg | ORAL_TABLET | Freq: Every day | ORAL | 5 refills | Status: DC

## 2021-05-04 ENCOUNTER — Other Ambulatory Visit: Payer: Self-pay | Admitting: Internal Medicine

## 2021-05-04 DIAGNOSIS — G894 Chronic pain syndrome: Secondary | ICD-10-CM | POA: Diagnosis not present

## 2021-05-04 DIAGNOSIS — M47816 Spondylosis without myelopathy or radiculopathy, lumbar region: Secondary | ICD-10-CM | POA: Diagnosis not present

## 2021-05-04 DIAGNOSIS — F32A Depression, unspecified: Secondary | ICD-10-CM | POA: Diagnosis not present

## 2021-05-04 DIAGNOSIS — R32 Unspecified urinary incontinence: Secondary | ICD-10-CM | POA: Diagnosis not present

## 2021-05-11 ENCOUNTER — Other Ambulatory Visit: Payer: Self-pay | Admitting: Internal Medicine

## 2021-05-14 ENCOUNTER — Other Ambulatory Visit: Payer: Self-pay | Admitting: Internal Medicine

## 2021-06-01 DIAGNOSIS — Z79891 Long term (current) use of opiate analgesic: Secondary | ICD-10-CM | POA: Diagnosis not present

## 2021-06-01 DIAGNOSIS — F32A Depression, unspecified: Secondary | ICD-10-CM | POA: Diagnosis not present

## 2021-06-01 DIAGNOSIS — R32 Unspecified urinary incontinence: Secondary | ICD-10-CM | POA: Diagnosis not present

## 2021-06-01 DIAGNOSIS — G894 Chronic pain syndrome: Secondary | ICD-10-CM | POA: Diagnosis not present

## 2021-06-01 DIAGNOSIS — M47816 Spondylosis without myelopathy or radiculopathy, lumbar region: Secondary | ICD-10-CM | POA: Diagnosis not present

## 2021-06-06 DIAGNOSIS — Z79891 Long term (current) use of opiate analgesic: Secondary | ICD-10-CM | POA: Diagnosis not present

## 2021-06-06 DIAGNOSIS — G894 Chronic pain syndrome: Secondary | ICD-10-CM | POA: Diagnosis not present

## 2021-07-11 DIAGNOSIS — E1140 Type 2 diabetes mellitus with diabetic neuropathy, unspecified: Secondary | ICD-10-CM | POA: Diagnosis not present

## 2021-07-11 DIAGNOSIS — I10 Essential (primary) hypertension: Secondary | ICD-10-CM | POA: Diagnosis not present

## 2021-07-11 DIAGNOSIS — R262 Difficulty in walking, not elsewhere classified: Secondary | ICD-10-CM | POA: Diagnosis not present

## 2021-07-11 DIAGNOSIS — I6389 Other cerebral infarction: Secondary | ICD-10-CM | POA: Diagnosis not present

## 2021-07-11 DIAGNOSIS — J209 Acute bronchitis, unspecified: Secondary | ICD-10-CM | POA: Diagnosis not present

## 2021-07-11 DIAGNOSIS — E118 Type 2 diabetes mellitus with unspecified complications: Secondary | ICD-10-CM | POA: Diagnosis not present

## 2021-07-11 DIAGNOSIS — R569 Unspecified convulsions: Secondary | ICD-10-CM | POA: Diagnosis not present

## 2021-07-21 ENCOUNTER — Telehealth: Payer: Self-pay | Admitting: Internal Medicine

## 2021-07-21 NOTE — Telephone Encounter (Signed)
Unable to leave a message for patient to call me back at [phone] to schedule Medicare Annual Wellness Visit   Last AWV  12/08/14  Please schedule at anytime with [facility] Advisor if patient calls the office back.    40 Minutes appointment   Any questions, please call me at [phone]

## 2021-07-31 ENCOUNTER — Other Ambulatory Visit: Payer: Self-pay

## 2021-07-31 ENCOUNTER — Ambulatory Visit (INDEPENDENT_AMBULATORY_CARE_PROVIDER_SITE_OTHER): Payer: Medicare Other | Admitting: Internal Medicine

## 2021-07-31 ENCOUNTER — Encounter: Payer: Self-pay | Admitting: Internal Medicine

## 2021-07-31 VITALS — BP 120/68 | HR 86 | Temp 98.2°F | Ht 65.0 in | Wt 127.8 lb

## 2021-07-31 DIAGNOSIS — Z8669 Personal history of other diseases of the nervous system and sense organs: Secondary | ICD-10-CM | POA: Diagnosis not present

## 2021-07-31 DIAGNOSIS — Z23 Encounter for immunization: Secondary | ICD-10-CM

## 2021-07-31 DIAGNOSIS — F32A Depression, unspecified: Secondary | ICD-10-CM

## 2021-07-31 DIAGNOSIS — E1100 Type 2 diabetes mellitus with hyperosmolarity without nonketotic hyperglycemic-hyperosmolar coma (NKHHC): Secondary | ICD-10-CM | POA: Diagnosis not present

## 2021-07-31 DIAGNOSIS — R634 Abnormal weight loss: Secondary | ICD-10-CM

## 2021-07-31 DIAGNOSIS — E538 Deficiency of other specified B group vitamins: Secondary | ICD-10-CM | POA: Diagnosis not present

## 2021-07-31 NOTE — Assessment & Plan Note (Signed)
Cont w/B12 

## 2021-07-31 NOTE — Assessment & Plan Note (Signed)
Check TSH, FT4 Discussed wt loss - not intentional

## 2021-07-31 NOTE — Assessment & Plan Note (Signed)
Check A1c Discussed wt loss - not intentional

## 2021-07-31 NOTE — Progress Notes (Signed)
Subjective:  Patient ID: [name], female    DOB: Nov 23, 1950  Age: 70 y.o. MRN: [id number]  CC: Follow-up (3 month f/u- Flu shot)   HPI [name] presents for anxiety C/o wt loss, DM, insomnia Pt lost 7 lbs  Outpatient Medications Prior to Visit  Medication Sig Dispense Refill   albuterol (VENTOLIN HFA) 108 (90 Base) MCG/ACT inhaler Inhale 1-2 puffs into the lungs every 4 (four) hours as needed for wheezing or shortness of breath. 56 g 3   aspirin 81 MG EC tablet Take 81 mg by mouth daily.     Blood Glucose Monitoring Suppl (ONETOUCH VERIO) w/Device KIT 1 Units by Does not apply route daily as needed. 1 kit 1   carbidopa-levodopa (SINEMET IR) 25-250 MG tablet TAKE 1 TABLET BY MOUTH 3 TIMES A DAY. 270 tablet 3   Cholecalciferol (VITAMIN D3) 50 MCG (2000 UT) capsule Take 1 capsule (2,000 Units total) by mouth daily. 100 capsule 3   dapagliflozin propanediol (FARXIGA) 5 MG TABS tablet Take 1 tablet (5 mg total) by mouth daily before breakfast. 30 tablet 5   diclofenac Sodium (VOLTAREN) 1 % GEL Apply 1 application topically 4 (four) times daily. 100 g 3   glucose blood (ONETOUCH VERIO) test strip Use to check blood sugars twice a day. Dx E11.9 50 each 11   HYDROmorphone (DILAUDID) 2 MG tablet Take 2-3 tablets (4-6 mg total) by mouth every 6 (six) hours as needed for moderate pain or severe pain. (Patient taking differently: Take 2 mg by mouth daily.) 75 tablet 0   LORazepam (ATIVAN) 2 MG tablet TAKE 1 TABLET EVERY 8 HOURS AS NEEDED FOR ANXIETY. 90 tablet 3   metFORMIN (GLUCOPHAGE) 500 MG tablet Take 1 tablet (500 mg total) by mouth 2 (two) times daily with a meal. 180 tablet 3   metoprolol tartrate (LOPRESSOR) 25 MG tablet TAKE 1/2 TABLET TWICE DAILY. 90 tablet 3   mirtazapine (REMERON) 15 MG tablet TAKE 1 TABLET BY MOUTH AT BEDTIME 6-8PM 90 tablet 3   multivitamin-lutein (OCUVITE-LUTEIN) CAPS capsule Take 1 capsule by mouth daily.     OneTouch Delica Lancets 23J MISC Use to help  check blood sugars once a day. Dx E11.9 50 each 11   repaglinide (PRANDIN) 2 MG tablet Take 2 tablets (4 mg total) by mouth 3 (three) times daily before meals. 360 tablet 3   tamsulosin (FLOMAX) 0.4 MG CAPS capsule TAKE (1) CAPSULE DAILY. 90 capsule 3   No facility-administered medications prior to visit.    ROS: Review of Systems  Constitutional:  Positive for fatigue. Negative for activity change, appetite change, chills and unexpected weight change.  HENT:  Negative for congestion, mouth sores and sinus pressure.   Eyes:  Negative for visual disturbance.  Respiratory:  Negative for cough and chest tightness.   Gastrointestinal:  Negative for abdominal pain and nausea.  Genitourinary:  Negative for difficulty urinating, frequency and vaginal pain.  Musculoskeletal:  Positive for arthralgias, gait problem and neck pain. Negative for back pain.  Skin:  Negative for pallor and rash.  Neurological:  Negative for dizziness, tremors, weakness, numbness and headaches.  Psychiatric/Behavioral:  Positive for decreased concentration. Negative for confusion, sleep disturbance and suicidal ideas. The patient is nervous/anxious.    Objective:  BP 120/68 (BP Location: Left Arm)   Pulse 86   Temp 98.2 F (36.8 C) (Oral)   Ht '5\' 5"'  (1.651 m)   Wt 127 lb 12.8 oz (58 kg)   SpO2  94%   BMI 21.27 kg/m   BP Readings from Last 3 Encounters:  07/31/21 120/68  04/27/21 130/70  01/26/21 132/70    Wt Readings from Last 3 Encounters:  07/31/21 127 lb 12.8 oz (58 kg)  04/27/21 134 lb 6.4 oz (61 kg)  01/26/21 146 lb 9.6 oz (66.5 kg)    Physical Exam Constitutional:      General: She is not in acute distress.    Appearance: Normal appearance. She is well-developed.  HENT:     Head: Normocephalic.     Right Ear: External ear normal.     Left Ear: External ear normal.     Nose: Nose normal.  Eyes:     General:        Right eye: No discharge.        Left eye: No discharge.      Conjunctiva/sclera: Conjunctivae normal.     Pupils: Pupils are equal, round, and reactive to light.  Neck:     Thyroid: No thyromegaly.     Vascular: No JVD.     Trachea: No tracheal deviation.  Cardiovascular:     Rate and Rhythm: Normal rate and regular rhythm.     Heart sounds: Normal heart sounds.  Pulmonary:     Effort: No respiratory distress.     Breath sounds: No stridor. No wheezing.  Abdominal:     General: Bowel sounds are normal. There is no distension.     Palpations: Abdomen is soft. There is no mass.     Tenderness: There is no abdominal tenderness. There is no guarding or rebound.  Musculoskeletal:        General: No tenderness.     Cervical back: Normal range of motion and neck supple. No rigidity.  Lymphadenopathy:     Cervical: No cervical adenopathy.  Skin:    Findings: No erythema or rash.  Neurological:     Mental Status: She is oriented to person, place, and time.     Cranial Nerves: No cranial nerve deficit.     Motor: No abnormal muscle tone.     Coordination: Coordination abnormal.     Deep Tendon Reflexes: Reflexes normal.  Psychiatric:        Behavior: Behavior normal.        Thought Content: Thought content normal.        Judgment: Judgment normal.   In a w/c   Lab Results  Component Value Date   WBC 5.5 07/17/2019   HGB 12.6 07/17/2019   HCT 42.6 07/17/2019   PLT 172 07/17/2019   GLUCOSE 175 (H) 08/04/2021   CHOL 213 (H) 04/16/2017   TRIG 183.0 (H) 04/16/2017   HDL 93.40 04/16/2017   LDLDIRECT 100.8 10/26/2013   LDLCALC 83 04/16/2017   ALT 3 08/04/2021   AST 8 08/04/2021   NA 137 08/04/2021   K 4.1 08/04/2021   CL 96 08/04/2021   CREATININE 0.70 08/04/2021   BUN 10 08/04/2021   CO2 33 (H) 08/04/2021   TSH 2.11 08/04/2021   INR 1.12 11/08/2016   HGBA1C 7.7 (H) 08/04/2021    DG Chest 2 View  Result Date: 07/16/2019 CLINICAL DATA:  Shortness of breath EXAM: CHEST - 2 VIEW COMPARISON:  08/19/2018 FINDINGS: Normal heart size  and mediastinal contours. No acute infiltrate or edema. No effusion or pneumothorax. No acute osseous findings. Remote left humerus fracture repair. Postoperative left upper quadrant. IMPRESSION: No evidence of active disease Electronically Signed   By: Monte Fantasia  M.D.   On: 07/16/2019 04:06   ECHOCARDIOGRAM COMPLETE  Result Date: 07/17/2019   ECHOCARDIOGRAM REPORT   Patient Name:   MIRIA CAPPELLI Date of Exam: 07/17/2019 Medical Rec #:  [id number]    Height:       65.0 in Accession #:    [id number]   Weight:       165.0 lb Date of Birth:  Oct 02, 1950   BSA:          1.82 m Patient Age:    87 years     BP:           137/68 mmHg Patient Gender: F            HR:           69 bpm. Exam Location:  Inpatient Procedure: 2D Echo, Color Doppler and Cardiac Doppler Indications:    R06.9 DOE  History:        Patient has prior history of Echocardiogram examinations, most                 recent 07/19/2016. COPD Risk Factors:Diabetes and Dyslipidemia.  Sonographer:    Raquel Sarna Senior RDCS Referring Phys: [id number] [place]  1. Left ventricular ejection fraction, by visual estimation, is 55 to 60%. The left ventricle has normal function. There is no left ventricular hypertrophy.  2. Left ventricular diastolic Doppler parameters are consistent with pseudonormalization pattern of LV diastolic filling.  3. Global right ventricle has normal systolic function.The right ventricular size is normal. No increase in right ventricular wall thickness.  4. Left atrial size was normal.  5. Right atrial size was normal.  6. The mitral valve is normal in structure. Trace mitral valve regurgitation.  7. The tricuspid valve is normal in structure. Tricuspid valve regurgitation was not visualized by color flow Doppler.  8. The aortic valve is normal in structure. Aortic valve regurgitation was not visualized by color flow Doppler. Structurally normal aortic valve, with no evidence of sclerosis or stenosis.  9. The pulmonic  valve was normal in structure. Pulmonic valve regurgitation is not visualized by color flow Doppler. 10. Mildly elevated pulmonary artery systolic pressure. 11. The atrial septum is grossly normal. FINDINGS  Left Ventricle: Left ventricular ejection fraction, by visual estimation, is 55 to 60%. The left ventricle has normal function. There is no left ventricular hypertrophy. Spectral Doppler shows Left ventricular diastolic Doppler parameters are consistent  with pseudonormalization pattern of LV diastolic filling. Right Ventricle: The right ventricular size is normal. No increase in right ventricular wall thickness. Global RV systolic function is has normal systolic function. The tricuspid regurgitant velocity is 2.93 m/s, and with an assumed right atrial pressure  of 3 mmHg, the estimated right ventricular systolic pressure is mildly elevated at 37.3 mmHg. Left Atrium: Left atrial size was normal in size. Right Atrium: Right atrial size was normal in size Pericardium: There is no evidence of pericardial effusion. Mitral Valve: The mitral valve is normal in structure. Trace mitral valve regurgitation. Tricuspid Valve: The tricuspid valve is normal in structure. Tricuspid valve regurgitation was not visualized by color flow Doppler. Aortic Valve: The aortic valve is normal in structure. Aortic valve regurgitation was not visualized by color flow Doppler. The aortic valve is structurally normal, with no evidence of sclerosis or stenosis. Pulmonic Valve: The pulmonic valve was normal in structure. Pulmonic valve regurgitation is not visualized by color flow Doppler. Aorta: The aortic root and ascending aorta are structurally normal, with no  evidence of dilitation. IAS/Shunts: The atrial septum is grossly normal.  LEFT VENTRICLE PLAX 2D LVIDd:         3.90 cm       Diastology LVIDs:         2.80 cm       LV e' lateral:   8.59 cm/s LV PW:         0.90 cm       LV E/e' lateral: 6.8 LV IVS:        0.90 cm       LV e'  medial:    5.00 cm/s LVOT diam:     2.00 cm       LV E/e' medial:  11.7 LV SV:         36 ml LV SV Index:   19.44 LVOT Area:     3.14 cm  LV Volumes (MOD) LV area d, A2C:    21.20 cm LV area d, A4C:    18.90 cm LV area s, A2C:    13.40 cm LV area s, A4C:    11.90 cm LV major d, A2C:   6.78 cm LV major d, A4C:   6.04 cm LV major s, A2C:   5.82 cm LV major s, A4C:   5.28 cm LV vol d, MOD A2C: 55.5 ml LV vol d, MOD A4C: 49.0 ml LV vol s, MOD A2C: 27.6 ml LV vol s, MOD A4C: 22.8 ml LV SV MOD A2C:     27.9 ml LV SV MOD A4C:     49.0 ml LV SV MOD BP:      29.1 ml RIGHT VENTRICLE RV S prime:     9.68 cm/s TAPSE (M-mode): 1.8 cm LEFT ATRIUM             Index LA diam:        2.10 cm 1.15 cm/m LA Vol (A2C):   34.2 ml 18.76 ml/m LA Vol (A4C):   27.5 ml 15.09 ml/m LA Biplane Vol: 32.2 ml 17.66 ml/m  AORTIC VALVE LVOT Vmax:   67.90 cm/s LVOT Vmean:  46.400 cm/s LVOT VTI:    0.151 m  AORTA Ao Root diam: 3.10 cm Ao Asc diam:  3.20 cm MITRAL VALVE                        TRICUSPID VALVE MV Area (PHT): 4.17 cm             TR Peak grad:   34.3 mmHg MV PHT:        52.78 msec           TR Vmax:        293.00 cm/s MV Decel Time: 182 msec MV E velocity: 58.30 cm/s 103 cm/s  SHUNTS MV A velocity: 52.70 cm/s 70.3 cm/s Systemic VTI:  0.15 m MV E/A ratio:  1.11       1.5       Systemic Diam: 2.00 cm  Mertie Moores MD Electronically signed by Mertie Moores MD Signature Date/Time: 07/17/2019/11:37:13 AM    Final     Assessment & Plan:   Problem List Items Addressed This Visit     B12 deficiency    Cont w/B12      Depression    Cont on Lorazepam prn      DM2 (diabetes mellitus, type 2) (HCC)    Check A1c Discussed wt loss - not intentional      Relevant Orders  Comprehensive metabolic panel (Completed)   T4, free (Completed)   TSH (Completed)   Hemoglobin A1c (Completed)   History of anoxic brain injury    In a w/c Using weights      Weight loss    Check TSH, FT4 Discussed wt loss - not intentional       Relevant Orders   Comprehensive metabolic panel (Completed)   T4, free (Completed)   TSH (Completed)   Other Visit Diagnoses     Needs flu shot    -  Primary   Relevant Orders   Flu Vaccine QUAD High Dose(Fluad) (Completed)         No orders of the defined types were placed in this encounter.     Follow-up: Return in about 3 months (around 10/31/2021) for a follow-up visit.  Walker Kehr, MD

## 2021-07-31 NOTE — Assessment & Plan Note (Signed)
In a w/c Using weights

## 2021-07-31 NOTE — Assessment & Plan Note (Signed)
Cont on Lorazepam prn

## 2021-08-04 ENCOUNTER — Other Ambulatory Visit (INDEPENDENT_AMBULATORY_CARE_PROVIDER_SITE_OTHER): Payer: Medicare Other

## 2021-08-04 DIAGNOSIS — R634 Abnormal weight loss: Secondary | ICD-10-CM | POA: Diagnosis not present

## 2021-08-04 DIAGNOSIS — E1100 Type 2 diabetes mellitus with hyperosmolarity without nonketotic hyperglycemic-hyperosmolar coma (NKHHC): Secondary | ICD-10-CM

## 2021-08-04 LAB — COMPREHENSIVE METABOLIC PANEL
ALT: 3 U/L (ref 0–35)
AST: 8 U/L (ref 0–37)
Albumin: 4 g/dL (ref 3.5–5.2)
Alkaline Phosphatase: 52 U/L (ref 39–117)
BUN: 10 mg/dL (ref 6–23)
CO2: 33 mEq/L — ABNORMAL HIGH (ref 19–32)
Calcium: 9 mg/dL (ref 8.4–10.5)
Chloride: 96 mEq/L (ref 96–112)
Creatinine, Ser: 0.7 mg/dL (ref 0.40–1.20)
GFR: 87.31 mL/min (ref >60.00)
Glucose, Bld: 175 mg/dL — ABNORMAL HIGH (ref 70–99)
Potassium: 4.1 mEq/L (ref 3.5–5.1)
Sodium: 137 mEq/L (ref 135–145)
Total Bilirubin: 0.5 mg/dL (ref 0.2–1.2)
Total Protein: 6.5 g/dL (ref 6.0–8.3)

## 2021-08-04 LAB — TSH: TSH: 2.11 u[IU]/mL (ref 0.35–5.50)

## 2021-08-04 LAB — HEMOGLOBIN A1C: Hgb A1c MFr Bld: 7.7 % — ABNORMAL HIGH (ref 4.6–6.5)

## 2021-08-04 LAB — T4, FREE: Free T4: 1.08 ng/dL (ref 0.60–1.60)

## 2021-08-07 DIAGNOSIS — M47816 Spondylosis without myelopathy or radiculopathy, lumbar region: Secondary | ICD-10-CM | POA: Diagnosis not present

## 2021-08-07 DIAGNOSIS — F32A Depression, unspecified: Secondary | ICD-10-CM | POA: Diagnosis not present

## 2021-08-07 DIAGNOSIS — G894 Chronic pain syndrome: Secondary | ICD-10-CM | POA: Diagnosis not present

## 2021-08-07 DIAGNOSIS — R32 Unspecified urinary incontinence: Secondary | ICD-10-CM | POA: Diagnosis not present

## 2021-08-14 ENCOUNTER — Other Ambulatory Visit: Payer: Self-pay | Admitting: Internal Medicine

## 2021-09-04 DIAGNOSIS — R32 Unspecified urinary incontinence: Secondary | ICD-10-CM | POA: Diagnosis not present

## 2021-09-04 DIAGNOSIS — G894 Chronic pain syndrome: Secondary | ICD-10-CM | POA: Diagnosis not present

## 2021-09-04 DIAGNOSIS — M47816 Spondylosis without myelopathy or radiculopathy, lumbar region: Secondary | ICD-10-CM | POA: Diagnosis not present

## 2021-09-04 DIAGNOSIS — F32A Depression, unspecified: Secondary | ICD-10-CM | POA: Diagnosis not present

## 2021-09-19 DIAGNOSIS — Z23 Encounter for immunization: Secondary | ICD-10-CM | POA: Diagnosis not present

## 2021-10-03 DIAGNOSIS — F32A Depression, unspecified: Secondary | ICD-10-CM | POA: Diagnosis not present

## 2021-10-03 DIAGNOSIS — M47816 Spondylosis without myelopathy or radiculopathy, lumbar region: Secondary | ICD-10-CM | POA: Diagnosis not present

## 2021-10-03 DIAGNOSIS — G894 Chronic pain syndrome: Secondary | ICD-10-CM | POA: Diagnosis not present

## 2021-10-03 DIAGNOSIS — Z79891 Long term (current) use of opiate analgesic: Secondary | ICD-10-CM | POA: Diagnosis not present

## 2021-10-03 DIAGNOSIS — R32 Unspecified urinary incontinence: Secondary | ICD-10-CM | POA: Diagnosis not present

## 2021-10-04 DIAGNOSIS — Z79891 Long term (current) use of opiate analgesic: Secondary | ICD-10-CM | POA: Diagnosis not present

## 2021-10-04 DIAGNOSIS — G894 Chronic pain syndrome: Secondary | ICD-10-CM | POA: Diagnosis not present

## 2021-10-31 ENCOUNTER — Other Ambulatory Visit: Payer: Self-pay

## 2021-10-31 ENCOUNTER — Encounter: Payer: Self-pay | Admitting: Internal Medicine

## 2021-10-31 ENCOUNTER — Ambulatory Visit (INDEPENDENT_AMBULATORY_CARE_PROVIDER_SITE_OTHER): Payer: Medicare Other | Admitting: Internal Medicine

## 2021-10-31 DIAGNOSIS — E559 Vitamin D deficiency, unspecified: Secondary | ICD-10-CM

## 2021-10-31 DIAGNOSIS — F4321 Adjustment disorder with depressed mood: Secondary | ICD-10-CM | POA: Diagnosis not present

## 2021-10-31 DIAGNOSIS — E1100 Type 2 diabetes mellitus with hyperosmolarity without nonketotic hyperglycemic-hyperosmolar coma (NKHHC): Secondary | ICD-10-CM | POA: Diagnosis not present

## 2021-10-31 DIAGNOSIS — F418 Other specified anxiety disorders: Secondary | ICD-10-CM | POA: Diagnosis not present

## 2021-10-31 DIAGNOSIS — E538 Deficiency of other specified B group vitamins: Secondary | ICD-10-CM | POA: Diagnosis not present

## 2021-10-31 NOTE — Assessment & Plan Note (Signed)
On vIt D

## 2021-10-31 NOTE — Assessment & Plan Note (Signed)
On Vit B12 

## 2021-10-31 NOTE — Patient Instructions (Signed)
Wt Readings from Last 3 Encounters:  10/31/21 131 lb 6.4 oz (59.6 kg)  07/31/21 127 lb 12.8 oz (58 kg)  04/27/21 134 lb 6.4 oz (61 kg)

## 2021-10-31 NOTE — Progress Notes (Signed)
Subjective:  Patient ID: [name], female    DOB: January 05, 1951  Age: 71 y.o. MRN: [id number]  CC: Follow-up (3 month f/u)   HPI [name] presents for stress - older sister is dying in [place], [place]  F/u stress, anxiety, tremor, pain   Outpatient Medications Prior to Visit  Medication Sig Dispense Refill   albuterol (VENTOLIN HFA) 108 (90 Base) MCG/ACT inhaler Inhale 1-2 puffs into the lungs every 4 (four) hours as needed for wheezing or shortness of breath. 56 g 3   aspirin 81 MG EC tablet Take 81 mg by mouth daily.     Blood Glucose Monitoring Suppl (ONETOUCH VERIO) w/Device KIT 1 Units by Does not apply route daily as needed. 1 kit 1   carbidopa-levodopa (SINEMET IR) 25-250 MG tablet TAKE 1 TABLET BY MOUTH 3 TIMES A DAY. 270 tablet 3   Cholecalciferol (VITAMIN D3) 50 MCG (2000 UT) capsule Take 1 capsule (2,000 Units total) by mouth daily. 100 capsule 3   dapagliflozin propanediol (FARXIGA) 5 MG TABS tablet Take 1 tablet (5 mg total) by mouth daily before breakfast. 30 tablet 5   diclofenac Sodium (VOLTAREN) 1 % GEL Apply 1 application topically 4 (four) times daily. 100 g 3   glucose blood (ONETOUCH VERIO) test strip Use to check blood sugars twice a day. Dx E11.9 50 each 11   HYDROmorphone (DILAUDID) 2 MG tablet Take 2-3 tablets (4-6 mg total) by mouth every 6 (six) hours as needed for moderate pain or severe pain. (Patient taking differently: Take 2 mg by mouth daily.) 75 tablet 0   LORazepam (ATIVAN) 2 MG tablet TAKE ONE TABLET BY MOUTH EVERY 8 HOURS AS NEEDED FOR ANXIETY 90 tablet 3   metFORMIN (GLUCOPHAGE) 500 MG tablet Take 1 tablet (500 mg total) by mouth 2 (two) times daily with a meal. 180 tablet 3   metoprolol tartrate (LOPRESSOR) 25 MG tablet TAKE 1/2 TABLET TWICE DAILY. 90 tablet 3   mirtazapine (REMERON) 15 MG tablet TAKE 1 TABLET BY MOUTH AT BEDTIME 6-8PM 90 tablet 3   multivitamin-lutein (OCUVITE-LUTEIN) CAPS capsule Take 1 capsule by mouth daily.     OneTouch  Delica Lancets 06T MISC Use to help check blood sugars once a day. Dx E11.9 50 each 11   repaglinide (PRANDIN) 2 MG tablet Take 2 tablets (4 mg total) by mouth 3 (three) times daily before meals. 360 tablet 3   tamsulosin (FLOMAX) 0.4 MG CAPS capsule TAKE (1) CAPSULE DAILY. 90 capsule 3   No facility-administered medications prior to visit.    ROS: Review of Systems  Constitutional:  Negative for activity change, appetite change, chills, fatigue and unexpected weight change.  HENT:  Negative for congestion, mouth sores and sinus pressure.   Eyes:  Negative for visual disturbance.  Respiratory:  Negative for cough and chest tightness.   Gastrointestinal:  Negative for abdominal pain and nausea.  Genitourinary:  Negative for difficulty urinating, frequency and vaginal pain.  Musculoskeletal:  Positive for arthralgias, gait problem, neck pain and neck stiffness. Negative for back pain.  Skin:  Negative for pallor and rash.  Neurological:  Negative for dizziness, tremors, weakness, numbness and headaches.  Psychiatric/Behavioral:  Positive for decreased concentration. Negative for confusion, sleep disturbance and suicidal ideas. The patient is nervous/anxious.    Objective:  BP 120/72 (BP Location: Left Arm)    Pulse 92    Temp 98.5 F (36.9 C) (Oral)    Ht '5\' 5"'  (1.651 m)  Wt 131 lb 6.4 oz (59.6 kg)    SpO2 92%    BMI 21.87 kg/m   BP Readings from Last 3 Encounters:  10/31/21 120/72  07/31/21 120/68  04/27/21 130/70    Wt Readings from Last 3 Encounters:  10/31/21 131 lb 6.4 oz (59.6 kg)  07/31/21 127 lb 12.8 oz (58 kg)  04/27/21 134 lb 6.4 oz (61 kg)    Physical Exam Constitutional:      General: She is not in acute distress.    Appearance: She is well-developed.  HENT:     Head: Normocephalic.     Right Ear: External ear normal.     Left Ear: External ear normal.     Nose: Nose normal.  Eyes:     General:        Right eye: No discharge.        Left eye: No discharge.      Conjunctiva/sclera: Conjunctivae normal.     Pupils: Pupils are equal, round, and reactive to light.  Neck:     Thyroid: No thyromegaly.     Vascular: No JVD.     Trachea: No tracheal deviation.  Cardiovascular:     Rate and Rhythm: Normal rate and regular rhythm.     Heart sounds: Normal heart sounds.  Pulmonary:     Effort: No respiratory distress.     Breath sounds: No stridor. No wheezing.  Abdominal:     General: Bowel sounds are normal. There is no distension.     Palpations: Abdomen is soft. There is no mass.     Tenderness: There is no abdominal tenderness. There is no guarding or rebound.  Musculoskeletal:        General: No tenderness.     Cervical back: Normal range of motion and neck supple. No rigidity.  Lymphadenopathy:     Cervical: No cervical adenopathy.  Skin:    Findings: No erythema or rash.  Neurological:     Mental Status: She is oriented to person, place, and time.     Cranial Nerves: No cranial nerve deficit.     Motor: Weakness present. No abnormal muscle tone.     Coordination: Coordination abnormal.     Gait: Gait abnormal.     Deep Tendon Reflexes: Reflexes normal.  Psychiatric:        Behavior: Behavior normal.        Thought Content: Thought content normal.        Judgment: Judgment normal.   In a w/c  Tearful   Lab Results  Component Value Date   WBC 5.5 07/17/2019   HGB 12.6 07/17/2019   HCT 42.6 07/17/2019   PLT 172 07/17/2019   GLUCOSE 175 (H) 08/04/2021   CHOL 213 (H) 04/16/2017   TRIG 183.0 (H) 04/16/2017   HDL 93.40 04/16/2017   LDLDIRECT 100.8 10/26/2013   LDLCALC 83 04/16/2017   ALT 3 08/04/2021   AST 8 08/04/2021   NA 137 08/04/2021   K 4.1 08/04/2021   CL 96 08/04/2021   CREATININE 0.70 08/04/2021   BUN 10 08/04/2021   CO2 33 (H) 08/04/2021   TSH 2.11 08/04/2021   INR 1.12 11/08/2016   HGBA1C 7.7 (H) 08/04/2021    DG Chest 2 View  Result Date: 07/16/2019 CLINICAL DATA:  Shortness of breath EXAM: CHEST -  2 VIEW COMPARISON:  08/19/2018 FINDINGS: Normal heart size and mediastinal contours. No acute infiltrate or edema. No effusion or pneumothorax. No acute osseous findings. Remote left  humerus fracture repair. Postoperative left upper quadrant. IMPRESSION: No evidence of active disease Electronically Signed   By: Monte Fantasia M.D.   On: 07/16/2019 04:06   ECHOCARDIOGRAM COMPLETE  Result Date: 07/17/2019   ECHOCARDIOGRAM REPORT   Patient Name:   FANTASY DONALD Date of Exam: 07/17/2019 Medical Rec #:  [id number]    Height:       65.0 in Accession #:    [id number]   Weight:       165.0 lb Date of Birth:  20-Feb-1951   BSA:          1.82 m Patient Age:    12 years     BP:           137/68 mmHg Patient Gender: F            HR:           69 bpm. Exam Location:  Inpatient Procedure: 2D Echo, Color Doppler and Cardiac Doppler Indications:    R06.9 DOE  History:        Patient has prior history of Echocardiogram examinations, most                 recent 07/19/2016. COPD Risk Factors:Diabetes and Dyslipidemia.  Sonographer:    Raquel Sarna Senior RDCS Referring Phys: [id number] [place]  1. Left ventricular ejection fraction, by visual estimation, is 55 to 60%. The left ventricle has normal function. There is no left ventricular hypertrophy.  2. Left ventricular diastolic Doppler parameters are consistent with pseudonormalization pattern of LV diastolic filling.  3. Global right ventricle has normal systolic function.The right ventricular size is normal. No increase in right ventricular wall thickness.  4. Left atrial size was normal.  5. Right atrial size was normal.  6. The mitral valve is normal in structure. Trace mitral valve regurgitation.  7. The tricuspid valve is normal in structure. Tricuspid valve regurgitation was not visualized by color flow Doppler.  8. The aortic valve is normal in structure. Aortic valve regurgitation was not visualized by color flow Doppler. Structurally normal aortic valve,  with no evidence of sclerosis or stenosis.  9. The pulmonic valve was normal in structure. Pulmonic valve regurgitation is not visualized by color flow Doppler. 10. Mildly elevated pulmonary artery systolic pressure. 11. The atrial septum is grossly normal. FINDINGS  Left Ventricle: Left ventricular ejection fraction, by visual estimation, is 55 to 60%. The left ventricle has normal function. There is no left ventricular hypertrophy. Spectral Doppler shows Left ventricular diastolic Doppler parameters are consistent  with pseudonormalization pattern of LV diastolic filling. Right Ventricle: The right ventricular size is normal. No increase in right ventricular wall thickness. Global RV systolic function is has normal systolic function. The tricuspid regurgitant velocity is 2.93 m/s, and with an assumed right atrial pressure  of 3 mmHg, the estimated right ventricular systolic pressure is mildly elevated at 37.3 mmHg. Left Atrium: Left atrial size was normal in size. Right Atrium: Right atrial size was normal in size Pericardium: There is no evidence of pericardial effusion. Mitral Valve: The mitral valve is normal in structure. Trace mitral valve regurgitation. Tricuspid Valve: The tricuspid valve is normal in structure. Tricuspid valve regurgitation was not visualized by color flow Doppler. Aortic Valve: The aortic valve is normal in structure. Aortic valve regurgitation was not visualized by color flow Doppler. The aortic valve is structurally normal, with no evidence of sclerosis or stenosis. Pulmonic Valve: The pulmonic valve was normal in structure. Pulmonic  valve regurgitation is not visualized by color flow Doppler. Aorta: The aortic root and ascending aorta are structurally normal, with no evidence of dilitation. IAS/Shunts: The atrial septum is grossly normal.  LEFT VENTRICLE PLAX 2D LVIDd:         3.90 cm       Diastology LVIDs:         2.80 cm       LV e' lateral:   8.59 cm/s LV PW:         0.90 cm        LV E/e' lateral: 6.8 LV IVS:        0.90 cm       LV e' medial:    5.00 cm/s LVOT diam:     2.00 cm       LV E/e' medial:  11.7 LV SV:         36 ml LV SV Index:   19.44 LVOT Area:     3.14 cm  LV Volumes (MOD) LV area d, A2C:    21.20 cm LV area d, A4C:    18.90 cm LV area s, A2C:    13.40 cm LV area s, A4C:    11.90 cm LV major d, A2C:   6.78 cm LV major d, A4C:   6.04 cm LV major s, A2C:   5.82 cm LV major s, A4C:   5.28 cm LV vol d, MOD A2C: 55.5 ml LV vol d, MOD A4C: 49.0 ml LV vol s, MOD A2C: 27.6 ml LV vol s, MOD A4C: 22.8 ml LV SV MOD A2C:     27.9 ml LV SV MOD A4C:     49.0 ml LV SV MOD BP:      29.1 ml RIGHT VENTRICLE RV S prime:     9.68 cm/s TAPSE (M-mode): 1.8 cm LEFT ATRIUM             Index LA diam:        2.10 cm 1.15 cm/m LA Vol (A2C):   34.2 ml 18.76 ml/m LA Vol (A4C):   27.5 ml 15.09 ml/m LA Biplane Vol: 32.2 ml 17.66 ml/m  AORTIC VALVE LVOT Vmax:   67.90 cm/s LVOT Vmean:  46.400 cm/s LVOT VTI:    0.151 m  AORTA Ao Root diam: 3.10 cm Ao Asc diam:  3.20 cm MITRAL VALVE                        TRICUSPID VALVE MV Area (PHT): 4.17 cm             TR Peak grad:   34.3 mmHg MV PHT:        52.78 msec           TR Vmax:        293.00 cm/s MV Decel Time: 182 msec MV E velocity: 58.30 cm/s 103 cm/s  SHUNTS MV A velocity: 52.70 cm/s 70.3 cm/s Systemic VTI:  0.15 m MV E/A ratio:  1.11       1.5       Systemic Diam: 2.00 cm  Mertie Moores MD Electronically signed by Mertie Moores MD Signature Date/Time: 07/17/2019/11:37:13 AM    Final     Assessment & Plan:   Problem List Items Addressed This Visit     Anxiety disorder    Lorazepam prn  Potential benefits of a long term benzodiazepines  use as well as potential risks  and complications were explained to  the patient and were aknowledged.      B12 deficiency    On Vit B12      DM2 (diabetes mellitus, type 2) (HCC)    Cont on Prandin, Metformin Check A1c      Relevant Orders   Hemoglobin A1c   Comprehensive metabolic panel   Grief     Discussed - older sister is dying in [place], [place]      Vitamin D deficiency    On vIt D         No orders of the defined types were placed in this encounter.     Follow-up: Return in about 3 months (around 01/28/2022) for a follow-up visit.  Walker Kehr, MD

## 2021-10-31 NOTE — Assessment & Plan Note (Signed)
Cont on Prandin, Metformin Check A1c

## 2021-10-31 NOTE — Assessment & Plan Note (Signed)
Discussed - older sister is dying in [place], [place]

## 2021-10-31 NOTE — Assessment & Plan Note (Signed)
Lorazepam prn  Potential benefits of a long term benzodiazepines  use as well as potential risks  and complications were explained to the patient and were aknowledged.  

## 2021-11-01 DIAGNOSIS — F32A Depression, unspecified: Secondary | ICD-10-CM | POA: Diagnosis not present

## 2021-11-01 DIAGNOSIS — G894 Chronic pain syndrome: Secondary | ICD-10-CM | POA: Diagnosis not present

## 2021-11-01 DIAGNOSIS — M47816 Spondylosis without myelopathy or radiculopathy, lumbar region: Secondary | ICD-10-CM | POA: Diagnosis not present

## 2021-11-01 DIAGNOSIS — R32 Unspecified urinary incontinence: Secondary | ICD-10-CM | POA: Diagnosis not present

## 2021-11-23 ENCOUNTER — Other Ambulatory Visit: Payer: Self-pay | Admitting: Internal Medicine

## 2021-11-29 DIAGNOSIS — R32 Unspecified urinary incontinence: Secondary | ICD-10-CM | POA: Diagnosis not present

## 2021-11-29 DIAGNOSIS — F32A Depression, unspecified: Secondary | ICD-10-CM | POA: Diagnosis not present

## 2021-11-29 DIAGNOSIS — G894 Chronic pain syndrome: Secondary | ICD-10-CM | POA: Diagnosis not present

## 2021-11-29 DIAGNOSIS — M47816 Spondylosis without myelopathy or radiculopathy, lumbar region: Secondary | ICD-10-CM | POA: Diagnosis not present

## 2021-12-14 ENCOUNTER — Other Ambulatory Visit: Payer: Self-pay

## 2021-12-14 ENCOUNTER — Ambulatory Visit (INDEPENDENT_AMBULATORY_CARE_PROVIDER_SITE_OTHER): Payer: Medicare Other | Admitting: Internal Medicine

## 2021-12-14 ENCOUNTER — Encounter: Payer: Self-pay | Admitting: Internal Medicine

## 2021-12-14 DIAGNOSIS — J3089 Other allergic rhinitis: Secondary | ICD-10-CM | POA: Diagnosis not present

## 2021-12-14 DIAGNOSIS — F418 Other specified anxiety disorders: Secondary | ICD-10-CM | POA: Diagnosis not present

## 2021-12-14 DIAGNOSIS — F4321 Adjustment disorder with depressed mood: Secondary | ICD-10-CM | POA: Diagnosis not present

## 2021-12-14 DIAGNOSIS — J45901 Unspecified asthma with (acute) exacerbation: Secondary | ICD-10-CM | POA: Insufficient documentation

## 2021-12-14 DIAGNOSIS — E1100 Type 2 diabetes mellitus with hyperosmolarity without nonketotic hyperglycemic-hyperosmolar coma (NKHHC): Secondary | ICD-10-CM

## 2021-12-14 DIAGNOSIS — J4521 Mild intermittent asthma with (acute) exacerbation: Secondary | ICD-10-CM | POA: Diagnosis not present

## 2021-12-14 MED ORDER — PROMETHAZINE-DM 6.25-15 MG/5ML PO SYRP: 5.0000 mL | ORAL_SOLUTION | Freq: Four times a day (QID) | ORAL | 0 refills | Status: AC | PRN

## 2021-12-14 MED ORDER — LORAZEPAM 2 MG PO TABS: 2.0000 mg | ORAL_TABLET | Freq: Three times a day (TID) | ORAL | 3 refills | Status: DC | PRN

## 2021-12-14 MED ORDER — AZITHROMYCIN 250 MG PO TABS: ORAL_TABLET | ORAL | 0 refills | Status: DC

## 2021-12-14 NOTE — Progress Notes (Signed)
? ?Subjective:  ?Patient ID: [name], female    DOB: Dec 28, 1950  Age: 71 y.o. MRN: [id number] ? ?CC: No chief complaint on file. ? ? ?HPI ?[name] presents for cough - worse x 1 -2 wks ?C/o anxiety, grieving ? ?Outpatient Medications Prior to Visit  ?Medication Sig Dispense Refill  ? albuterol (VENTOLIN HFA) 108 (90 Base) MCG/ACT inhaler Inhale 1-2 puffs into the lungs every 4 (four) hours as needed for wheezing or shortness of breath. 56 g 3  ? aspirin 81 MG EC tablet Take 81 mg by mouth daily.    ? Blood Glucose Monitoring Suppl (ONETOUCH VERIO) w/Device KIT 1 Units by Does not apply route daily as needed. 1 kit 1  ? carbidopa-levodopa (SINEMET IR) 25-250 MG tablet TAKE 1 TABLET BY MOUTH 3 TIMES A DAY. 270 tablet 3  ? Cholecalciferol (VITAMIN D3) 50 MCG (2000 UT) capsule Take 1 capsule (2,000 Units total) by mouth daily. 100 capsule 3  ? dapagliflozin propanediol (FARXIGA) 5 MG TABS tablet Take 1 tablet (5 mg total) by mouth daily before breakfast. 30 tablet 5  ? diclofenac Sodium (VOLTAREN) 1 % GEL Apply 1 application topically 4 (four) times daily. 100 g 3  ? glucose blood (ONETOUCH VERIO) test strip Use to check blood sugars twice a day. Dx E11.9 50 each 11  ? HYDROmorphone (DILAUDID) 2 MG tablet Take 2-3 tablets (4-6 mg total) by mouth every 6 (six) hours as needed for moderate pain or severe pain. (Patient taking differently: Take 2 mg by mouth daily.) 75 tablet 0  ? metFORMIN (GLUCOPHAGE) 500 MG tablet Take 1 tablet (500 mg total) by mouth 2 (two) times daily with a meal. 180 tablet 3  ? metoprolol tartrate (LOPRESSOR) 25 MG tablet TAKE 1/2 TABLET TWICE DAILY. 90 tablet 3  ? mirtazapine (REMERON) 15 MG tablet TAKE 1 TABLET BY MOUTH AT BEDTIME 6-8PM 90 tablet 3  ? multivitamin-lutein (OCUVITE-LUTEIN) CAPS capsule Take 1 capsule by mouth daily.    ? OneTouch Delica Lancets 61W MISC Use to help check blood sugars once a day. Dx E11.9 50 each 11  ? repaglinide (PRANDIN) 2 MG tablet Take 2 tablets (4 mg  total) by mouth 3 (three) times daily before meals. 360 tablet 3  ? tamsulosin (FLOMAX) 0.4 MG CAPS capsule TAKE (1) CAPSULE DAILY. 90 capsule 3  ? LORazepam (ATIVAN) 2 MG tablet TAKE ONE TABLET BY MOUTH EVERY 8 HOURS AS NEEDED FOR ANXIETY 90 tablet 3  ? ?No facility-administered medications prior to visit.  ? ? ?ROS: ?Review of Systems  ?Constitutional:  Positive for fatigue. Negative for activity change, appetite change, chills and unexpected weight change.  ?HENT:  Positive for postnasal drip. Negative for congestion, mouth sores and sinus pressure.   ?Eyes:  Negative for visual disturbance.  ?Respiratory:  Positive for cough and wheezing. Negative for chest tightness.   ?Cardiovascular:  Negative for chest pain.  ?Gastrointestinal:  Negative for abdominal pain and nausea.  ?Genitourinary:  Negative for difficulty urinating, frequency and vaginal pain.  ?Musculoskeletal:  Positive for arthralgias, gait problem and neck stiffness. Negative for back pain.  ?Skin:  Negative for pallor and rash.  ?Neurological:  Negative for dizziness, tremors, weakness, numbness and headaches.  ?Psychiatric/Behavioral:  Negative for confusion and sleep disturbance.   ? ?Objective:  ?BP (!) 90/50 (BP Location: Left Arm, Patient Position: Sitting, Cuff Size: Large)   Pulse 75   Temp 98.1 ?F (36.7 ?C) (Oral)   Ht _0  (1.651 m)  Wt 131 lb (59.4 kg)   BMI 21.80 kg/m?  ? ?BP Readings from Last 3 Encounters:  ?12/14/21 (!) 90/50  ?10/31/21 120/72  ?07/31/21 120/68  ? ? ?Wt Readings from Last 3 Encounters:  ?12/14/21 131 lb (59.4 kg)  ?10/31/21 131 lb 6.4 oz (59.6 kg)  ?07/31/21 127 lb 12.8 oz (58 kg)  ? ? ?Physical Exam ?Constitutional:   ?   General: She is not in acute distress. ?   Appearance: She is well-developed.  ?HENT:  ?   Head: Normocephalic.  ?   Right Ear: External ear normal.  ?   Left Ear: External ear normal.  ?   Nose: Nose normal.  ?Eyes:  ?   General:     ?   Right eye: No discharge.     ?   Left eye: No  discharge.  ?   Conjunctiva/sclera: Conjunctivae normal.  ?   Pupils: Pupils are equal, round, and reactive to light.  ?Neck:  ?   Thyroid: No thyromegaly.  ?   Vascular: No JVD.  ?   Trachea: No tracheal deviation.  ?Cardiovascular:  ?   Rate and Rhythm: Normal rate and regular rhythm.  ?   Heart sounds: Normal heart sounds.  ?Pulmonary:  ?   Effort: No respiratory distress.  ?   Breath sounds: No stridor. Rhonchi present. No wheezing.  ?Abdominal:  ?   General: Bowel sounds are normal. There is no distension.  ?   Palpations: Abdomen is soft. There is no mass.  ?   Tenderness: There is no abdominal tenderness. There is no guarding or rebound.  ?Musculoskeletal:     ?   General: No tenderness.  ?   Cervical back: Normal range of motion and neck supple. No rigidity.  ?Lymphadenopathy:  ?   Cervical: No cervical adenopathy.  ?Skin: ?   Findings: No erythema or rash.  ?Neurological:  ?   Mental Status: She is oriented to person, place, and time.  ?   Cranial Nerves: No cranial nerve deficit.  ?   Motor: No abnormal muscle tone.  ?   Coordination: Coordination abnormal.  ?   Gait: Gait abnormal.  ?   Deep Tendon Reflexes: Reflexes normal.  ?Psychiatric:     ?   Behavior: Behavior normal.     ?   Thought Content: Thought content normal.     ?   Judgment: Judgment normal.  ?In a w/c ?sad ? ?Lab Results  ?Component Value Date  ? WBC 5.5 07/17/2019  ? HGB 12.6 07/17/2019  ? HCT 42.6 07/17/2019  ? PLT 172 07/17/2019  ? GLUCOSE 175 (H) 08/04/2021  ? CHOL 213 (H) 04/16/2017  ? TRIG 183.0 (H) 04/16/2017  ? HDL 93.40 04/16/2017  ? LDLDIRECT 100.8 10/26/2013  ? [place] 83 04/16/2017  ? ALT 3 08/04/2021  ? AST 8 08/04/2021  ? NA 137 08/04/2021  ? K 4.1 08/04/2021  ? CL 96 08/04/2021  ? CREATININE 0.70 08/04/2021  ? BUN 10 08/04/2021  ? CO2 33 (H) 08/04/2021  ? TSH 2.11 08/04/2021  ? INR 1.12 11/08/2016  ? HGBA1C 7.7 (H) 08/04/2021  ? ? ?DG Chest 2 View ? ?Result Date: 07/16/2019 ?CLINICAL DATA:  Shortness of breath EXAM: CHEST -  2 VIEW COMPARISON:  08/19/2018 FINDINGS: Normal heart size and mediastinal contours. No acute infiltrate or edema. No effusion or pneumothorax. No acute osseous findings. Remote left humerus fracture repair. Postoperative left upper quadrant. IMPRESSION: No evidence of active  disease Electronically Signed   By: Monte Fantasia M.D.   On: 07/16/2019 04:06  ? ?ECHOCARDIOGRAM COMPLETE ? ?Result Date: 07/17/2019 ?  ECHOCARDIOGRAM REPORT   Patient Name:   JAYLEENE GLAESER Date of Exam: 07/17/2019 Medical Rec #:  [id number]    Height:       65.0 in Accession #:    [id number]   Weight:       165.0 lb Date of Birth:  02-Nov-1950   BSA:          1.82 m? Patient Age:    71 years     BP:           137/68 mmHg Patient Gender: F            HR:           69 bpm. Exam Location:  Inpatient Procedure: 2D Echo, Color Doppler and Cardiac Doppler Indications:    R06.9 DOE  History:        Patient has prior history of Echocardiogram examinations, most                 recent 07/19/2016. COPD Risk Factors:Diabetes and Dyslipidemia.  Sonographer:    Raquel Sarna Senior RDCS Referring Phys: [id number] [place]  1. Left ventricular ejection fraction, by visual estimation, is 55 to 60%. The left ventricle has normal function. There is no left ventricular hypertrophy.  2. Left ventricular diastolic Doppler parameters are consistent with pseudonormalization pattern of LV diastolic filling.  3. Global right ventricle has normal systolic function.The right ventricular size is normal. No increase in right ventricular wall thickness.  4. Left atrial size was normal.  5. Right atrial size was normal.  6. The mitral valve is normal in structure. Trace mitral valve regurgitation.  7. The tricuspid valve is normal in structure. Tricuspid valve regurgitation was not visualized by color flow Doppler.  8. The aortic valve is normal in structure. Aortic valve regurgitation was not visualized by color flow Doppler. Structurally normal aortic valve,  with no evidence of sclerosis or stenosis.  9. The pulmonic valve was normal in structure. Pulmonic valve regurgitation is not visualized by color flow Doppler. 10. Mildly elevated pulmonary artery systolic pressur

## 2021-12-14 NOTE — Assessment & Plan Note (Signed)
Cont on Prandin, Metformin ?

## 2021-12-14 NOTE — Assessment & Plan Note (Addendum)
Worse ?Older sister died in [place], [place] ?Discussed ?

## 2021-12-14 NOTE — Assessment & Plan Note (Signed)
Claritin prn 

## 2021-12-14 NOTE — Assessment & Plan Note (Signed)
Prom cough syr ?Z pack if worse ?Pt declined CXR ?

## 2021-12-14 NOTE — Assessment & Plan Note (Signed)
Lorazepam prn  Potential benefits of a long term benzodiazepines  use as well as potential risks  and complications were explained to the patient and were aknowledged.  

## 2021-12-27 DIAGNOSIS — G894 Chronic pain syndrome: Secondary | ICD-10-CM | POA: Diagnosis not present

## 2021-12-27 DIAGNOSIS — M47816 Spondylosis without myelopathy or radiculopathy, lumbar region: Secondary | ICD-10-CM | POA: Diagnosis not present

## 2021-12-27 DIAGNOSIS — R32 Unspecified urinary incontinence: Secondary | ICD-10-CM | POA: Diagnosis not present

## 2021-12-27 DIAGNOSIS — F32A Depression, unspecified: Secondary | ICD-10-CM | POA: Diagnosis not present

## 2022-01-19 ENCOUNTER — Encounter (HOSPITAL_COMMUNITY): Payer: Self-pay

## 2022-01-19 ENCOUNTER — Observation Stay (HOSPITAL_COMMUNITY): Payer: Medicare Other

## 2022-01-19 ENCOUNTER — Observation Stay (HOSPITAL_COMMUNITY)
Admission: EM | Admit: 2022-01-19 | Discharge: 2022-01-20 | Disposition: A | Discharge status: Home Health | Payer: Medicare Other | Attending: Internal Medicine | Admitting: Internal Medicine

## 2022-01-19 ENCOUNTER — Emergency Department (HOSPITAL_COMMUNITY): Payer: Medicare Other

## 2022-01-19 ENCOUNTER — Other Ambulatory Visit: Payer: Self-pay

## 2022-01-19 DIAGNOSIS — G459 Transient cerebral ischemic attack, unspecified: Principal | ICD-10-CM | POA: Insufficient documentation

## 2022-01-19 DIAGNOSIS — I491 Atrial premature depolarization: Secondary | ICD-10-CM | POA: Diagnosis not present

## 2022-01-19 DIAGNOSIS — I6523 Occlusion and stenosis of bilateral carotid arteries: Secondary | ICD-10-CM | POA: Diagnosis not present

## 2022-01-19 DIAGNOSIS — R0902 Hypoxemia: Secondary | ICD-10-CM | POA: Diagnosis not present

## 2022-01-19 DIAGNOSIS — J449 Chronic obstructive pulmonary disease, unspecified: Secondary | ICD-10-CM | POA: Diagnosis not present

## 2022-01-19 DIAGNOSIS — I739 Peripheral vascular disease, unspecified: Secondary | ICD-10-CM | POA: Diagnosis not present

## 2022-01-19 DIAGNOSIS — Z7984 Long term (current) use of oral hypoglycemic drugs: Secondary | ICD-10-CM | POA: Diagnosis not present

## 2022-01-19 DIAGNOSIS — R55 Syncope and collapse: Secondary | ICD-10-CM | POA: Diagnosis not present

## 2022-01-19 DIAGNOSIS — E1100 Type 2 diabetes mellitus with hyperosmolarity without nonketotic hyperglycemic-hyperosmolar coma (NKHHC): Secondary | ICD-10-CM

## 2022-01-19 DIAGNOSIS — R4182 Altered mental status, unspecified: Secondary | ICD-10-CM | POA: Diagnosis present

## 2022-01-19 DIAGNOSIS — I959 Hypotension, unspecified: Secondary | ICD-10-CM | POA: Diagnosis not present

## 2022-01-19 DIAGNOSIS — R479 Unspecified speech disturbances: Secondary | ICD-10-CM

## 2022-01-19 DIAGNOSIS — Z20822 Contact with and (suspected) exposure to covid-19: Secondary | ICD-10-CM | POA: Insufficient documentation

## 2022-01-19 DIAGNOSIS — Z7982 Long term (current) use of aspirin: Secondary | ICD-10-CM | POA: Diagnosis not present

## 2022-01-19 DIAGNOSIS — R29818 Other symptoms and signs involving the nervous system: Secondary | ICD-10-CM | POA: Diagnosis not present

## 2022-01-19 DIAGNOSIS — Z87891 Personal history of nicotine dependence: Secondary | ICD-10-CM | POA: Diagnosis not present

## 2022-01-19 DIAGNOSIS — G319 Degenerative disease of nervous system, unspecified: Secondary | ICD-10-CM | POA: Diagnosis not present

## 2022-01-19 DIAGNOSIS — I672 Cerebral atherosclerosis: Secondary | ICD-10-CM | POA: Diagnosis not present

## 2022-01-19 DIAGNOSIS — R739 Hyperglycemia, unspecified: Secondary | ICD-10-CM | POA: Diagnosis not present

## 2022-01-19 DIAGNOSIS — R42 Dizziness and giddiness: Secondary | ICD-10-CM | POA: Diagnosis not present

## 2022-01-19 DIAGNOSIS — Z8673 Personal history of transient ischemic attack (TIA), and cerebral infarction without residual deficits: Secondary | ICD-10-CM | POA: Diagnosis not present

## 2022-01-19 DIAGNOSIS — E119 Type 2 diabetes mellitus without complications: Secondary | ICD-10-CM | POA: Insufficient documentation

## 2022-01-19 LAB — URINALYSIS, ROUTINE W REFLEX MICROSCOPIC
Bilirubin Urine: NEGATIVE
Glucose, UA: NEGATIVE mg/dL
Hgb urine dipstick: NEGATIVE
Ketones, ur: 5 mg/dL — AB
Nitrite: NEGATIVE
Protein, ur: NEGATIVE mg/dL
Specific Gravity, Urine: 1.018 (ref 1.005–1.030)
pH: 5 (ref 5.0–8.0)

## 2022-01-19 LAB — CBC WITH DIFFERENTIAL/PLATELET
Abs Immature Granulocytes: 0.01 10*3/uL (ref 0.00–0.07)
Basophils Absolute: 0 10*3/uL (ref 0.0–0.1)
Basophils Relative: 1 %
Eosinophils Absolute: 0.1 10*3/uL (ref 0.0–0.5)
Eosinophils Relative: 2 %
HCT: 37.4 % (ref 36.0–46.0)
Hemoglobin: 11.4 g/dL — ABNORMAL LOW (ref 12.0–15.0)
Immature Granulocytes: 0 %
Lymphocytes Relative: 22 %
Lymphs Abs: 1 10*3/uL (ref 0.7–4.0)
MCH: 26.3 pg (ref 26.0–34.0)
MCHC: 30.5 g/dL (ref 30.0–36.0)
MCV: 86.4 fL (ref 80.0–100.0)
Monocytes Absolute: 0.3 10*3/uL (ref 0.1–1.0)
Monocytes Relative: 6 %
Neutro Abs: 3.2 10*3/uL (ref 1.7–7.7)
Neutrophils Relative %: 69 %
Platelets: 174 10*3/uL (ref 150–400)
RBC: 4.33 MIL/uL (ref 3.87–5.11)
RDW: 14.7 % (ref 11.5–15.5)
WBC: 4.7 10*3/uL (ref 4.0–10.5)
nRBC: 0 % (ref 0.0–0.2)

## 2022-01-19 LAB — COMPREHENSIVE METABOLIC PANEL
ALT: <5 U/L (ref 0–44)
AST: 11 U/L — ABNORMAL LOW (ref 15–41)
Albumin: 3.4 g/dL — ABNORMAL LOW (ref 3.5–5.0)
Alkaline Phosphatase: 53 U/L (ref 38–126)
Anion gap: 8 (ref 5–15)
BUN: 12 mg/dL (ref 8–23)
CO2: 26 mmol/L (ref 22–32)
Calcium: 8.6 mg/dL — ABNORMAL LOW (ref 8.9–10.3)
Chloride: 105 mmol/L (ref 98–111)
Creatinine, Ser: 0.73 mg/dL (ref 0.44–1.00)
GFR, Estimated: >60 mL/min (ref >60)
Glucose, Bld: 223 mg/dL — ABNORMAL HIGH (ref 70–99)
Potassium: 4.1 mmol/L (ref 3.5–5.1)
Sodium: 139 mmol/L (ref 135–145)
Total Bilirubin: 0.9 mg/dL (ref 0.3–1.2)
Total Protein: 5.6 g/dL — ABNORMAL LOW (ref 6.5–8.1)

## 2022-01-19 LAB — D-DIMER, QUANTITATIVE: D-Dimer, Quant: 0.3 ug/mL-FEU (ref 0.00–0.50)

## 2022-01-19 LAB — LACTIC ACID, PLASMA: Lactic Acid, Venous: 1.5 mmol/L (ref 0.5–1.9)

## 2022-01-19 LAB — BLOOD GAS, VENOUS
Acid-Base Excess: 4.9 mmol/L — ABNORMAL HIGH (ref 0.0–2.0)
Bicarbonate: 32.7 mmol/L — ABNORMAL HIGH (ref 20.0–28.0)
Drawn by: 1390
O2 Saturation: 22.2 %
Patient temperature: 36.5
pCO2, Ven: 61 mmHg — ABNORMAL HIGH (ref 44–60)
pH, Ven: 7.34 (ref 7.25–7.43)
pO2, Ven: <31 mmHg — CL (ref 32–45)

## 2022-01-19 LAB — TSH: TSH: 1.168 u[IU]/mL (ref 0.350–4.500)

## 2022-01-19 LAB — RESP PANEL BY RT-PCR (FLU A&B, COVID) ARPGX2
Influenza A by PCR: NEGATIVE
Influenza B by PCR: NEGATIVE
SARS Coronavirus 2 by RT PCR: NEGATIVE

## 2022-01-19 LAB — VITAMIN B12: Vitamin B-12: 191 pg/mL (ref 180–914)

## 2022-01-19 LAB — GLUCOSE, CAPILLARY: Glucose-Capillary: 132 mg/dL — ABNORMAL HIGH (ref 70–99)

## 2022-01-19 LAB — PHOSPHORUS: Phosphorus: 3.6 mg/dL (ref 2.5–4.6)

## 2022-01-19 LAB — MAGNESIUM: Magnesium: 1.2 mg/dL — ABNORMAL LOW (ref 1.7–2.4)

## 2022-01-19 MED ORDER — INSULIN ASPART 100 UNIT/ML IJ SOLN
0.0000 [IU] | Freq: Three times a day (TID) | INTRAMUSCULAR | Status: DC
  Administered 2022-01-19: 2 [IU] via SUBCUTANEOUS
  Administered 2022-01-20: 3 [IU] via SUBCUTANEOUS
  Administered 2022-01-20: 2 [IU] via SUBCUTANEOUS

## 2022-01-19 MED ORDER — METOPROLOL TARTRATE 12.5 MG HALF TABLET
12.5000 mg | ORAL_TABLET | Freq: Two times a day (BID) | ORAL | Status: DC
  Administered 2022-01-19 – 2022-01-20 (×2): 12.5 mg via ORAL
  Filled 2022-01-19 (×2): qty 1

## 2022-01-19 MED ORDER — STROKE: EARLY STAGES OF RECOVERY BOOK
Freq: Once | Status: AC
  Filled 2022-01-19: qty 1

## 2022-01-19 MED ORDER — MAGNESIUM SULFATE 2 GM/50ML IV SOLN
2.0000 g | Freq: Once | INTRAVENOUS | Status: AC
  Administered 2022-01-19: 2 g via INTRAVENOUS
  Filled 2022-01-19: qty 50

## 2022-01-19 MED ORDER — TAMSULOSIN HCL 0.4 MG PO CAPS
0.4000 mg | ORAL_CAPSULE | Freq: Every day | ORAL | Status: DC
  Administered 2022-01-19 – 2022-01-20 (×2): 0.4 mg via ORAL
  Filled 2022-01-19 (×2): qty 1

## 2022-01-19 MED ORDER — ACETAMINOPHEN 650 MG RE SUPP: 650.0000 mg | RECTAL | Status: DC | PRN

## 2022-01-19 MED ORDER — LORAZEPAM 1 MG PO TABS
2.0000 mg | ORAL_TABLET | Freq: Three times a day (TID) | ORAL | Status: DC | PRN
  Administered 2022-01-20: 2 mg via ORAL
  Filled 2022-01-19: qty 2

## 2022-01-19 MED ORDER — CARBIDOPA-LEVODOPA 25-250 MG PO TABS
1.0000 | ORAL_TABLET | Freq: Three times a day (TID) | ORAL | Status: DC
  Administered 2022-01-19 – 2022-01-20 (×3): 1 via ORAL
  Filled 2022-01-19 (×7): qty 1

## 2022-01-19 MED ORDER — ACETAMINOPHEN 325 MG PO TABS: 650.0000 mg | ORAL_TABLET | ORAL | Status: DC | PRN

## 2022-01-19 MED ORDER — ACETAMINOPHEN 160 MG/5ML PO SOLN: 650.0000 mg | ORAL | Status: DC | PRN

## 2022-01-19 MED ORDER — ALBUTEROL SULFATE (2.5 MG/3ML) 0.083% IN NEBU: 3.0000 mL | INHALATION_SOLUTION | RESPIRATORY_TRACT | Status: DC | PRN

## 2022-01-19 MED ORDER — ASPIRIN EC 81 MG PO TBEC
81.0000 mg | DELAYED_RELEASE_TABLET | Freq: Every day | ORAL | Status: DC
  Administered 2022-01-19 – 2022-01-20 (×2): 81 mg via ORAL
  Filled 2022-01-19 (×3): qty 1

## 2022-01-19 MED ORDER — REPAGLINIDE 2 MG PO TABS
4.0000 mg | ORAL_TABLET | Freq: Three times a day (TID) | ORAL | Status: DC
Start: 2022-01-19 — End: 2022-01-19

## 2022-01-19 MED ORDER — MIRTAZAPINE 15 MG PO TABS
15.0000 mg | ORAL_TABLET | Freq: Every day | ORAL | Status: DC
  Administered 2022-01-19: 15 mg via ORAL
  Filled 2022-01-19: qty 1

## 2022-01-19 MED ORDER — IOHEXOL 350 MG/ML SOLN
60.0000 mL | Freq: Once | INTRAVENOUS | Status: AC | PRN
  Administered 2022-01-19: 60 mL via INTRAVENOUS

## 2022-01-19 MED ORDER — HYDROMORPHONE HCL 2 MG PO TABS
4.0000 mg | ORAL_TABLET | Freq: Four times a day (QID) | ORAL | Status: DC | PRN
  Administered 2022-01-19 – 2022-01-20 (×2): 4 mg via ORAL
  Filled 2022-01-19 (×2): qty 2

## 2022-01-19 MED ORDER — DAPAGLIFLOZIN PROPANEDIOL 5 MG PO TABS
5.0000 mg | ORAL_TABLET | Freq: Every day | ORAL | Status: DC
  Administered 2022-01-20: 5 mg via ORAL
  Filled 2022-01-19 (×2): qty 1

## 2022-01-19 MED ORDER — ENOXAPARIN SODIUM 40 MG/0.4ML IJ SOSY
40.0000 mg | PREFILLED_SYRINGE | INTRAMUSCULAR | Status: DC
  Administered 2022-01-19 – 2022-01-20 (×2): 40 mg via SUBCUTANEOUS
  Filled 2022-01-19 (×2): qty 0.4

## 2022-01-19 NOTE — ED Notes (Signed)
Pt reports she does remember speech change, she knew what she wanted to say but couldn't get it out. She reports headache for a month, endorses dizziness, denies chest pain. Stroke screen negative at this time. Pt is unable to lift both legs d/t disability ?

## 2022-01-19 NOTE — H&P (Signed)
?History and Physical  ? ? ?[name] PIR:[id number] DOB: 1951-05-19 DOA: 01/19/2022 ? ?PCP: Plotnikov, Evie Lacks, MD (Confirm with patient/family/NH records and if not entered, this has to be entered at [facility] point of entry) ?Patient coming from: Home ? ?I have personally briefly reviewed patient's old medical records in [place] ? ?Chief Complaint: I had trouble to talk ? ?HPI: [name] is a 71 y.o. female with medical history significant of anoxic brain injury and Parkinson-like syndrome, COPD, HTN, IIDM, chronic neck pain and narcotic dependence, seizure disorder of medications,came with transient aphasia. ? ?Patient reported having episode of feeling dizziness around 930 this morning and then resolved quickly.  But around noon time, patient developed episode of aphasia, which she described as no problem understanding but having trouble to form sentences increment.  Also noticed by family, episode lasted about 15 to 20 minutes and resolved by itself.  Denies any weakness numbness tingling of any of the limbs.  Son reported that patient had similar symptoms when she had episode of UTI before.  Patient takes COPD medication but never had lung function test.  She was treated for [place] of COPD exacerbation 4-5 weeks ago.  She denies any shortness of breath or cough. ? ?ED Course: O2 saturation borderline low, afebrile, no tachycardia no hypotension. ? ?MRI negative for stroke. ? ?Blood work magnesium 1.2, EKG showed frequent PVCs. ? ?Review of Systems: As per HPI otherwise 14 point review of systems negative.  ? ? ?Past Medical History:  ?Diagnosis Date  ? Anoxic brain injury ([place])   ? Anxiety   ? Aortic atherosclerosis ([place]) 10/04/2017  ? Cellulitis and abscess of other specified site 10/11/2010  ? Qualifier: Diagnosis of  By: Plotnikov MD, Evie Jackson   ? Chronic neck pain   ? Chronic pain in shoulder   ? COPD (chronic obstructive pulmonary disease) ([place])   ? Depression   ? Fracture of left humerus  06/24/2014  ? Gait disorder   ? GERD (gastroesophageal reflux disease)   ? Hernia of abdominal cavity   ? History of unilateral cerebral infarction in a watershed distribution   ? Hyperlipidemia   ? LBP (low back pain)   ? Neuropathy   ? Osteoarthritis   ? Seizure disorder ([place])   ? Seizures ([place])   ? Type II or unspecified type diabetes mellitus without mention of complication, not stated as uncontrolled 2010  ? UTI (urinary tract infection) 10/2017  ? Vitamin B 12 deficiency 2011  ? ? ?Past Surgical History:  ?Procedure Laterality Date  ? COLONOSCOPY    ? DIAGNOSTIC LAPAROSCOPY    ? PMH: Exploratory lap  ? HERNIA REPAIR    ? incisional  ? ORIF HUMERUS FRACTURE Left 06/24/2014  ? Procedure: LEFT OPEN REDUCTION INTERNAL FIXATION (ORIF) PROXIMAL HUMERUS FRACTURE;  Surgeon: Johnny Bridge, MD;  Location: [facility];  Service: Orthopedics;  Laterality: Left;  ? PANCREATECTOMY    ? TONSILLECTOMY    ? TUBAL LIGATION    ? ? ? reports that she quit smoking about 5 years ago. Her smoking use included cigarettes. She has a 20.00 pack-year smoking history. She has never used smokeless tobacco. She reports current alcohol use of about 10.0 standard drinks per week. She reports that she does not use drugs. ? ?Allergies  ?Allergen Reactions  ? Asa [Aspirin] Other (See Comments)  ?  "stomach burning", "I can take baby aspirin"  ? Prednisone Other (See Comments)  ?  MD told  it gavepancreatitis  and almost died  ? ? ?Family History  ?Problem Relation Age of Onset  ? Stroke Mother   ? Heart disease Father 60  ?     MI  ? Hypertension Other   ? ? ? ?Prior to Admission medications   ?Medication Sig Start Date End Date Taking? Authorizing Provider  ?albuterol (VENTOLIN HFA) 108 (90 Base) MCG/ACT inhaler Inhale 1-2 puffs into the lungs every 4 (four) hours as needed for wheezing or shortness of breath. 08/06/19   Plotnikov, Evie Lacks, MD  ?aspirin 81 MG EC tablet Take 81 mg by mouth daily.    [provider]  ?azithromycin (ZITHROMAX  Z-PAK) 250 MG tablet As directed 12/14/21   Plotnikov, Evie Lacks, MD  ?Blood Glucose Monitoring Suppl (ONETOUCH VERIO) w/Device KIT 1 Units by Does not apply route daily as needed. 11/09/19   Plotnikov, Evie Lacks, MD  ?carbidopa-levodopa (SINEMET IR) 25-250 MG tablet TAKE 1 TABLET BY MOUTH 3 TIMES A DAY. 04/06/21   Plotnikov, Evie Lacks, MD  ?Cholecalciferol (VITAMIN D3) 50 MCG (2000 UT) capsule Take 1 capsule (2,000 Units total) by mouth daily. 08/06/19   Plotnikov, Evie Lacks, MD  ?dapagliflozin propanediol (FARXIGA) 5 MG TABS tablet Take 1 tablet (5 mg total) by mouth daily before breakfast. 04/29/21   Plotnikov, Evie Lacks, MD  ?diclofenac Sodium (VOLTAREN) 1 % GEL Apply 1 application topically 4 (four) times daily. 10/03/20   Plotnikov, Evie Lacks, MD  ?glucose blood (ONETOUCH VERIO) test strip Use to check blood sugars twice a day. Dx E11.9 11/09/19   Plotnikov, Evie Lacks, MD  ?HYDROmorphone (DILAUDID) 2 MG tablet Take 2-3 tablets (4-6 mg total) by mouth every 6 (six) hours as needed for moderate pain or severe pain. ?Patient taking differently: Take 2 mg by mouth daily. 06/24/14   Marchia Bond, MD  ?LORazepam (ATIVAN) 2 MG tablet Take 1 tablet (2 mg total) by mouth every 8 (eight) hours as needed. for anxiety 12/14/21   Plotnikov, Evie Lacks, MD  ?metFORMIN (GLUCOPHAGE) 500 MG tablet Take 1 tablet (500 mg total) by mouth 2 (two) times daily with a meal. 01/27/21   Plotnikov, Evie Lacks, MD  ?metoprolol tartrate (LOPRESSOR) 25 MG tablet TAKE 1/2 TABLET TWICE DAILY. 05/04/21   Plotnikov, Evie Lacks, MD  ?mirtazapine (REMERON) 15 MG tablet TAKE 1 TABLET BY MOUTH AT BEDTIME 6-8PM 05/11/21   Plotnikov, Evie Lacks, MD  ?multivitamin-lutein (OCUVITE-LUTEIN) CAPS capsule Take 1 capsule by mouth daily.    [provider]  ?Jonetta Speak Lancets 07H MISC Use to help check blood sugars once a day. Dx E11.9 11/09/19   Plotnikov, Evie Lacks, MD  ?promethazine-dextromethorphan (PROMETHAZINE-DM) 6.25-15 MG/5ML syrup Take 5 mLs by mouth 4  (four) times daily as needed for cough. 12/14/21   Plotnikov, Evie Lacks, MD  ?repaglinide (PRANDIN) 2 MG tablet Take 2 tablets (4 mg total) by mouth 3 (three) times daily before meals. 01/27/21   Plotnikov, Evie Lacks, MD  ?tamsulosin (FLOMAX) 0.4 MG CAPS capsule TAKE (1) CAPSULE DAILY. 05/14/21   Plotnikov, Evie Lacks, MD  ? ? ?Physical Exam: ?Vitals:  ? 01/19/22 1243 01/19/22 1315 01/19/22 1400 01/19/22 1530  ?BP: (!) 105/53 (!) 108/55 (!) 103/57 (!) 112/56  ?Pulse: 80 77 71 72  ?Resp: 18 (!) '24 19 12  ' ?Temp: 97.8 ?F (36.6 ?C)     ?TempSrc: Oral     ?SpO2: 92% 98% 98% 99%  ?Weight:      ?Height:      ? ? ?  Constitutional: NAD, calm, comfortable ?Vitals:  ? 01/19/22 1243 01/19/22 1315 01/19/22 1400 01/19/22 1530  ?BP: (!) 105/53 (!) 108/55 (!) 103/57 (!) 112/56  ?Pulse: 80 77 71 72  ?Resp: 18 (!) '24 19 12  ' ?Temp: 97.8 ?F (36.6 ?C)     ?TempSrc: Oral     ?SpO2: 92% 98% 98% 99%  ?Weight:      ?Height:      ? ?Eyes: PERRL, lids and conjunctivae normal ?ENMT: Mucous membranes are moist. Posterior pharynx clear of any exudate or lesions.Normal dentition.  ?Neck: normal, supple, no masses, no thyromegaly ?Respiratory: clear to auscultation bilaterally, no wheezing, no crackles. Normal respiratory effort. No accessory muscle use.  ?Cardiovascular: Regular rate and rhythm, no murmurs / rubs / gallops. No extremity edema. 2+ pedal pulses. No carotid bruits.  ?Abdomen: no tenderness, no masses palpated. No hepatosplenomegaly. Bowel sounds positive.  ?Musculoskeletal: no clubbing / cyanosis. No joint deformity upper and lower extremities. Good ROM, no contractures. Normal muscle tone.  ?Skin: no rashes, lesions, ulcers. No induration ?Neurologic: CN 2-12 grossly intact. Sensation intact, DTR normal. Strength 5/5 in all 4.  ?Psychiatric: Normal judgment and insight. Alert and oriented x 3. Normal mood.  ? ? ? ?Labs on Admission: I have personally reviewed following labs and imaging studies ? ?CBC: ?Recent Labs  ?Lab 01/19/22 ?1242   ?WBC 4.7  ?NEUTROABS 3.2  ?HGB 11.4*  ?HCT 37.4  ?MCV 86.4  ?PLT 174  ? ?Basic Metabolic Panel: ?Recent Labs  ?Lab 01/19/22 ?1242  ?NA 139  ?K 4.1  ?CL 105  ?CO2 26  ?GLUCOSE 223*  ?BUN 12  ?CREATININE 0.7

## 2022-01-19 NOTE — ED Triage Notes (Signed)
[facility] reports pt coming from home. States at around 0930 pt had episode of dizziness and then around 1135 family reports pt had episode of slurred speech not lasting long. Upon EMS arrival pt hypotensive and oxygen 87% on RA. Pt only c/o dizziness with EMS. ?

## 2022-01-19 NOTE — ED Notes (Signed)
Patient transported to MRI 

## 2022-01-19 NOTE — ED Provider Notes (Signed)
?[facility] ?Provider Note ? ? ?CSN: [id number] ?Arrival date & time: 01/19/22  1236 ? ?  ? ?History ? ?Chief Complaint  ?Patient presents with  ? Altered Mental Status  ? ? ?[name] is a 71 y.o. female. ? ?The history is provided by the patient and medical records. No language interpreter was used.  ?Neurologic Problem ?This is a new problem. The current episode started 3 to 5 hours ago. The problem occurs rarely. The problem has been rapidly improving. Pertinent negatives include no chest pain, no abdominal pain, no headaches and no shortness of breath. Nothing aggravates the symptoms. Nothing relieves the symptoms. She has tried nothing for the symptoms. The treatment provided no relief.  ? ?  ? ?Home Medications ?Prior to Admission medications   ?Medication Sig Start Date End Date Taking? Authorizing Provider  ?albuterol (VENTOLIN HFA) 108 (90 Base) MCG/ACT inhaler Inhale 1-2 puffs into the lungs every 4 (four) hours as needed for wheezing or shortness of breath. 08/06/19   Plotnikov, Evie Lacks, MD  ?aspirin 81 MG EC tablet Take 81 mg by mouth daily.    [provider]  ?azithromycin (ZITHROMAX Z-PAK) 250 MG tablet As directed 12/14/21   Plotnikov, Evie Lacks, MD  ?Blood Glucose Monitoring Suppl (ONETOUCH VERIO) w/Device KIT 1 Units by Does not apply route daily as needed. 11/09/19   Plotnikov, Evie Lacks, MD  ?carbidopa-levodopa (SINEMET IR) 25-250 MG tablet TAKE 1 TABLET BY MOUTH 3 TIMES A DAY. 04/06/21   Plotnikov, Evie Lacks, MD  ?Cholecalciferol (VITAMIN D3) 50 MCG (2000 UT) capsule Take 1 capsule (2,000 Units total) by mouth daily. 08/06/19   Plotnikov, Evie Lacks, MD  ?dapagliflozin propanediol (FARXIGA) 5 MG TABS tablet Take 1 tablet (5 mg total) by mouth daily before breakfast. 04/29/21   Plotnikov, Evie Lacks, MD  ?diclofenac Sodium (VOLTAREN) 1 % GEL Apply 1 application topically 4 (four) times daily. 10/03/20   Plotnikov, Evie Lacks, MD  ?glucose blood (ONETOUCH  VERIO) test strip Use to check blood sugars twice a day. Dx E11.9 11/09/19   Plotnikov, Evie Lacks, MD  ?HYDROmorphone (DILAUDID) 2 MG tablet Take 2-3 tablets (4-6 mg total) by mouth every 6 (six) hours as needed for moderate pain or severe pain. ?Patient taking differently: Take 2 mg by mouth daily. 06/24/14   Marchia Bond, MD  ?LORazepam (ATIVAN) 2 MG tablet Take 1 tablet (2 mg total) by mouth every 8 (eight) hours as needed. for anxiety 12/14/21   Plotnikov, Evie Lacks, MD  ?metFORMIN (GLUCOPHAGE) 500 MG tablet Take 1 tablet (500 mg total) by mouth 2 (two) times daily with a meal. 01/27/21   Plotnikov, Evie Lacks, MD  ?metoprolol tartrate (LOPRESSOR) 25 MG tablet TAKE 1/2 TABLET TWICE DAILY. 05/04/21   Plotnikov, Evie Lacks, MD  ?mirtazapine (REMERON) 15 MG tablet TAKE 1 TABLET BY MOUTH AT BEDTIME 6-8PM 05/11/21   Plotnikov, Evie Lacks, MD  ?multivitamin-lutein (OCUVITE-LUTEIN) CAPS capsule Take 1 capsule by mouth daily.    [provider]  ?Jonetta Speak Lancets 39Q MISC Use to help check blood sugars once a day. Dx E11.9 11/09/19   Plotnikov, Evie Lacks, MD  ?promethazine-dextromethorphan (PROMETHAZINE-DM) 6.25-15 MG/5ML syrup Take 5 mLs by mouth 4 (four) times daily as needed for cough. 12/14/21   Plotnikov, Evie Lacks, MD  ?repaglinide (PRANDIN) 2 MG tablet Take 2 tablets (4 mg total) by mouth 3 (three) times daily before meals. 01/27/21   Plotnikov, Evie Lacks, MD  ?tamsulosin (FLOMAX) 0.4 MG  CAPS capsule TAKE (1) CAPSULE DAILY. 05/14/21   Plotnikov, Evie Lacks, MD  ?   ? ?Allergies    ?Asa [aspirin] and Prednisone   ? ?Review of Systems   ?Review of Systems  ?Constitutional:  Negative for chills, diaphoresis, fatigue and fever.  ?HENT:  Negative for congestion.   ?Eyes:  Negative for visual disturbance.  ?Respiratory:  Negative for cough, chest tightness and shortness of breath.   ?Cardiovascular:  Negative for chest pain.  ?Gastrointestinal:  Negative for abdominal pain, constipation, diarrhea, nausea and vomiting.   ?Genitourinary:  Negative for dysuria, flank pain, frequency, hematuria and urgency.  ?Musculoskeletal:  Negative for neck pain and neck stiffness.  ?Skin:  Negative for rash and wound.  ?Neurological:  Positive for speech difficulty and light-headedness. Negative for dizziness, syncope, weakness, numbness and headaches.  ?Psychiatric/Behavioral:  Negative for agitation.   ?All other systems reviewed and are negative. ? ?Physical Exam ?Updated Vital Signs ?BP (!) 105/53 (BP Location: Right Arm)   Pulse 80   Temp 97.8 ?F (36.6 ?C) (Oral)   Resp 18   Ht '5\' 5"'  (1.651 m)   Wt 59.4 kg   SpO2 92%   BMI 21.80 kg/m?  ?Physical Exam ?Vitals and nursing note reviewed.  ?Constitutional:   ?   General: She is not in acute distress. ?   Appearance: She is well-developed. She is not ill-appearing, toxic-appearing or diaphoretic.  ?HENT:  ?   Head: Normocephalic and atraumatic.  ?   Nose: No congestion or rhinorrhea.  ?   Mouth/Throat:  ?   Mouth: Mucous membranes are moist.  ?   Pharynx: No oropharyngeal exudate or posterior oropharyngeal erythema.  ?Eyes:  ?   General: No visual field deficit. ?   Extraocular Movements: Extraocular movements intact.  ?   Conjunctiva/sclera: Conjunctivae normal.  ?   Pupils: Pupils are equal, round, and reactive to light.  ?Neck:  ?   Vascular: No carotid bruit.  ?Cardiovascular:  ?   Rate and Rhythm: Normal rate and regular rhythm.  ?   Heart sounds: No murmur heard. ?Pulmonary:  ?   Effort: Pulmonary effort is normal. No respiratory distress.  ?   Breath sounds: Normal breath sounds. No wheezing, rhonchi or rales.  ?Chest:  ?   Chest wall: No tenderness.  ?Abdominal:  ?   General: Abdomen is flat.  ?   Palpations: Abdomen is soft.  ?   Tenderness: There is no abdominal tenderness. There is no right CVA tenderness, left CVA tenderness, guarding or rebound.  ?Musculoskeletal:     ?   General: No swelling or tenderness.  ?   Cervical back: Neck supple. No tenderness.  ?   Right lower leg:  No edema.  ?   Left lower leg: No edema.  ?Skin: ?   General: Skin is warm and dry.  ?   Capillary Refill: Capillary refill takes less than 2 seconds.  ?   Findings: No erythema or rash.  ?Neurological:  ?   Mental Status: She is alert and oriented to person, place, and time.  ?   GCS: GCS eye subscore is 4. GCS verbal subscore is 5. GCS motor subscore is 6.  ?   Cranial Nerves: No cranial nerve deficit or dysarthria.  ?   Sensory: No sensory deficit.  ?   Motor: No weakness, tremor, abnormal muscle tone or seizure activity.  ?   Coordination: Coordination normal. Finger-Nose-Finger Test normal.  ?   Comments: Slight  expressive aphasia when getting caught up on 1 sentence.  Otherwise clear speech.  ?Psychiatric:     ?   Mood and Affect: Mood normal.  ? ? ?ED Results / Procedures / Treatments   ?Labs ?(all labs ordered are listed, but only abnormal results are displayed) ?Labs Reviewed  ?CBC WITH DIFFERENTIAL/PLATELET - Abnormal; Notable for the following components:  ?    Result Value  ? Hemoglobin 11.4 (*)   ? All other components within normal limits  ?COMPREHENSIVE METABOLIC PANEL - Abnormal; Notable for the following components:  ? Glucose, Bld 223 (*)   ? Calcium 8.6 (*)   ? Total Protein 5.6 (*)   ? Albumin 3.4 (*)   ? AST 11 (*)   ? All other components within normal limits  ?MAGNESIUM - Abnormal; Notable for the following components:  ? Magnesium 1.2 (*)   ? All other components within normal limits  ?URINE CULTURE  ?RESP PANEL BY RT-PCR (FLU A&B, COVID) ARPGX2  ?TSH  ?LACTIC ACID, PLASMA  ?D-DIMER, QUANTITATIVE  ?LACTIC ACID, PLASMA  ?URINALYSIS, ROUTINE W REFLEX MICROSCOPIC  ?LACTIC ACID, PLASMA  ? ? ?EKG ?EKG Interpretation ? ?Date/Time:  Friday January 19 2022 12:41:22 EDT ?Ventricular Rate:  77 ?PR Interval:  137 ?QRS Duration: 102 ?QT Interval:  405 ?QTC Calculation: 459 ?R Axis:   56 ?Text Interpretation: Sinus rhythm Multiple ventricular premature complexes Low voltage, precordial leads when compared  to prior, more artifact and more PVC. No STEMI Confirmed by Antony Blackbird [phone]) on 01/19/2022 12:48:56 PM ? ?Radiology ?MR BRAIN WO CONTRAST ? ?Result Date: 01/19/2022 ?CLINICAL DATA:  Neuro deficit, acute, s

## 2022-01-19 NOTE — Progress Notes (Signed)
Date and time results received: 01/19/22 1838 ? ?Test: pO2 ?Critical Value: <31 ? ?Name of Provider Notified: Mikey College, MD paged ? ?Orders Received? Or Actions Taken?: No new orders at this time ?

## 2022-01-19 NOTE — ED Notes (Signed)
Pt does endorse difficulties getting her words out still. LKW was 0900. Pt reports changes in her speech aprox 1hr afterwards  ?

## 2022-01-19 NOTE — Progress Notes (Signed)
Pt arrived to room 3W30 via stretcher from the ED. Received report from [place], Charity fundraiser. See assessment. Will continue to monitor.  ?

## 2022-01-20 ENCOUNTER — Observation Stay (HOSPITAL_BASED_OUTPATIENT_CLINIC_OR_DEPARTMENT_OTHER): Payer: Medicare Other

## 2022-01-20 DIAGNOSIS — G459 Transient cerebral ischemic attack, unspecified: Secondary | ICD-10-CM

## 2022-01-20 DIAGNOSIS — J449 Chronic obstructive pulmonary disease, unspecified: Secondary | ICD-10-CM | POA: Diagnosis not present

## 2022-01-20 LAB — LIPID PANEL
Cholesterol: 218 mg/dL — ABNORMAL HIGH (ref 0–200)
HDL: 84 mg/dL (ref >40)
LDL Cholesterol: 108 mg/dL — ABNORMAL HIGH (ref 0–99)
Total CHOL/HDL Ratio: 2.6 RATIO
Triglycerides: 130 mg/dL (ref <150)
VLDL: 26 mg/dL (ref 0–40)

## 2022-01-20 LAB — ECHOCARDIOGRAM COMPLETE
Area-P 1/2: 3.72 cm2
Height: 65 in
S' Lateral: 2.7 cm
Single Plane A4C EF: 53.9 %
Weight: 2096 oz

## 2022-01-20 LAB — GLUCOSE, CAPILLARY
Glucose-Capillary: 157 mg/dL — ABNORMAL HIGH (ref 70–99)
Glucose-Capillary: 144 mg/dL — ABNORMAL HIGH (ref 70–99)

## 2022-01-20 LAB — MAGNESIUM: Magnesium: 1.8 mg/dL (ref 1.7–2.4)

## 2022-01-20 MED ORDER — HYDROMORPHONE HCL 2 MG PO TABS: 2.0000 mg | ORAL_TABLET | Freq: Every day | ORAL | Status: DC

## 2022-01-20 MED ORDER — ATORVASTATIN CALCIUM 40 MG PO TABS
40.0000 mg | ORAL_TABLET | Freq: Every day | ORAL | Status: DC
  Administered 2022-01-20: 40 mg via ORAL
  Filled 2022-01-20: qty 1

## 2022-01-20 MED ORDER — ATORVASTATIN CALCIUM 40 MG PO TABS: 40.0000 mg | ORAL_TABLET | Freq: Every day | ORAL | 2 refills | Status: DC

## 2022-01-20 MED ORDER — ONDANSETRON HCL 4 MG/2ML IJ SOLN
4.0000 mg | Freq: Four times a day (QID) | INTRAMUSCULAR | Status: DC | PRN
Start: 2022-01-20 — End: 2022-01-20
  Administered 2022-01-20: 4 mg via INTRAVENOUS
  Filled 2022-01-20: qty 2

## 2022-01-20 MED ORDER — MAGNESIUM OXIDE -MG SUPPLEMENT 400 (240 MG) MG PO TABS
400.0000 mg | ORAL_TABLET | Freq: Two times a day (BID) | ORAL | Status: DC
  Administered 2022-01-20: 400 mg via ORAL
  Filled 2022-01-20: qty 1

## 2022-01-20 MED ORDER — VITAMIN B-12 1000 MCG PO TABS
1000.0000 ug | ORAL_TABLET | Freq: Every day | ORAL | 0 refills | Status: AC
Start: 2022-01-20 — End: ?

## 2022-01-20 NOTE — Hospital Course (Addendum)
[name] is a 71 y.o. female with past medical history significant of anoxic brain injury and Parkinson-like syndrome, COPD, HTN, IIDM, chronic neck pain and narcotic dependence, seizure disorder of medications, pressure hospital was feelings of dizziness followed by aphasia which resolved by itself.  Did not have any weakness of body parts.  Patient's family reported similar symptoms when she had episode of UTI in the past.  In the ED patient was noted to have low magnesium level at 1.2.  EKG showed frequent PVCs.  MRI was performed which showed negative for stroke.  Patient was then placed in for observation. ? ?Assessment/Plan ? ?Principal Problem: ?  TIA (transient ischemic attack) ?Active Problems: ?  DM2 (diabetes mellitus, type 2) (HCC) ?  COPD (chronic obstructive pulmonary disease) (HCC) ?  Hypomagnesemia ?  ?Acute expressive aphasia, TIA likely ?MRI of the brain was negative for stroke.  CT angiogram of the head and neck did not show any emergent large vessel occlusion.  Lipid panel noted with elevated cholesterol at 218 with elevated LDL at 108.  Vitamin B12 was at 181.  Urinalysis was negative for UTI.  COVID influenza was negative.  D-dimer was negative.  Continue aspirin and will add Lipitor on discharge.  Also prescribed vitamin B12 on discharge ? ?Hypomagnesemia ?Improved after replacement.  Magnesium 1.8 today. ?  ?Type 2 diabetes with hyperglycemia ?Continue Farxiga, sliding scale.  Last hemoglobin A1c 5 months back was 7.7. ?  ?Parkinson syndrome ?On Sinemet.  Controlled ?  ?Chronic narcotic dependence ?On Dilaudid and Ativan at home.  We will continue on discharge.  Patient and the patient's husband at bedside aware of the side effects of these medications. ? ?Seizure disorder ?Not on medications for several years now. ? ?Frequent PVCs ?Continue metoprolol  ?

## 2022-01-20 NOTE — Evaluation (Signed)
Occupational Therapy Evaluation ?Patient Details ?Name: [name] ?MRN: [id number] ?DOB: [birth year] ?Today's Date: 01/20/2022 ? ? ?History of Present Illness [name] is a 71 y.o. female with medical history significant of anoxic brain injury and Parkinson-like syndrome, COPD, HTN, IIDM, chronic neck pain and narcotic dependence, seizure disorder of medications,came with transient aphasia. imaging negative for stroke, suspect hypoxia  ? ?Clinical Impression ?  ?[name] was evaluated s/p the above admission list, she requires assist at baseline due to prior deficits from anoxic BI that happened 20+ years ago. Her husband assists from supervision-mod/max A for all aspects of her care. Upon evaluation pt was min G for bed mobility, but once sitting EOB pt was limited by nausea and an episode of emesis therefore OOB activity was deferred. Pt's husband verbalized that she is performing her ADLs and functional ambulation close to her normal baseline. Due to limited assessment and general weakness, [facility]  ?   ? ?Recommendations for follow up therapy are one component of a multi-disciplinary discharge planning process, led by the attending physician.  Recommendations may be updated based on patient status, additional functional criteria and insurance authorization.  ? ?Follow Up Recommendations ? Home health OT  ?  ?Assistance Recommended at Discharge Frequent or constant Supervision/Assistance  ?Patient can return home with the following A little help with walking and/or transfers;A little help with bathing/dressing/bathroom;Assistance with cooking/housework;Direct supervision/assist for medications management;Direct supervision/assist for financial management;Assist for transportation;Help with stairs or ramp for entrance ? ?  ?Functional Status Assessment ? Patient has had a recent decline in their functional status and demonstrates the ability to make significant improvements in function  in a reasonable and predictable amount of time.  ?Equipment Recommendations ? None recommended by OT  ?  ?Recommendations for Other Services   ? ? ?  ?Precautions / Restrictions Precautions ?Precautions: Fall ?Precaution Comments: memory deficits from anoxic BI 24 years ago ?Restrictions ?Weight Bearing Restrictions: No  ? ?  ? ?Mobility Bed Mobility ?Overal bed mobility: Needs Assistance ?Bed Mobility: Supine to Sit, Sit to Supine ?  ?  ?Supine to sit: Min guard ?Sit to supine: Min guard ?  ?General bed mobility comments: pt very naueous ?  ? ?Transfers ?Overall transfer level: Needs assistance ?  ?  ?  ?  ?  ?  ?  ?  ?General transfer comment: pt with emesis upon sitting EOB, deferred OOB ?  ? ?  ?Balance Overall balance assessment: Needs assistance ?Sitting-balance support: Feet supported, No upper extremity supported ?Sitting balance-Leahy Scale: Fair ?  ?  ?  ?  ?  ?  ?  ?  ?  ?  ?  ?  ?  ?  ?  ?  ?   ? ?ADL either performed or assessed with clinical judgement  ? ?ADL Overall ADL's : Needs assistance/impaired ?Eating/Feeding: Independent;Sitting ?  ?Grooming: Set up;Sitting ?  ?Upper Body Bathing: Minimal assistance;Sitting ?  ?Lower Body Bathing: Minimal assistance;Sit to/from stand ?  ?Upper Body Dressing : Set up;Sitting ?  ?Lower Body Dressing: Moderate assistance;Sit to/from stand ?  ?Toilet Transfer: Minimal assistance;Ambulation;BSC/3in1 ?Toilet Transfer Details (indicate cue type and reason): short ambulation distance ?Toileting- Clothing Manipulation and Hygiene: Minimal assistance;Sit to/from stand ?  ?  ?  ?Functional mobility during ADLs: Minimal assistance;Rolling walker (2 wheels) ?General ADL Comments: limited by nausea this date however pt's husband said she is very close to her functioanl baseline  ? ? ? ?Vision  Baseline Vision/History: 0 No visual deficits ?Vision Assessment?: No apparent visual deficits  ?   ?Perception   ?  ?Praxis   ?  ? ?Pertinent Vitals/Pain Pain Assessment ?Pain  Assessment: No/denies pain ?Faces Pain Scale: Hurts little more ?Pain Location: chronic low backpain ?Pain Descriptors / Indicators: Aching  ? ? ? ?Hand Dominance Right ?  ?Extremity/Trunk Assessment Upper Extremity Assessment ?Upper Extremity Assessment: Generalized weakness ?  ?Lower Extremity Assessment ?Lower Extremity Assessment: Generalized weakness ?  ?Cervical / Trunk Assessment ?Cervical / Trunk Assessment: Kyphotic ?  ?Communication Communication ?Communication: No difficulties ?  ?Cognition Arousal/Alertness: Awake/alert ?Behavior During Therapy: [facility] for tasks assessed/performed ?Overall Cognitive Status: History of cognitive impairments - at baseline ?  ?  ?  ?  ?  ?  ?  ?  ?  ?  ?  ?  ?  ?  ?  ?  ?General Comments: pt with old anoxic brain injury ?  ?  ?General Comments  VSS on RA, husband present and supportive. Pt throwing up once sitting EOB, RN notified ? ?  ?Exercises   ?  ?Shoulder Instructions    ? ? ?Home Living Family/patient expects to be discharged to:: Private residence ?Living Arrangements: Spouse/significant other ?Available Help at Discharge: Family;Available 24 hours/day ?Type of Home: House ?Home Access: Ramped entrance ?  ?  ?Home Layout: One level ?  ?  ?Bathroom Shower/Tub: Walk-in shower ?  ?Bathroom Toilet: Standard ?Bathroom Accessibility: Yes ?  ?Home Equipment: Agricultural consultant (2 wheels);Transport chair;Shower seat ?  ?  ?  ? ?  ?Prior Functioning/Environment Prior Level of Function : Needs assist ?  ?  ?  ?  ?  ?  ?Mobility Comments: uses RW during the day and transport chair in evening/night due to fatigue and being unbalanced ?ADLs Comments: spouse sets up clothes,supervision for dressing, assists with backside bathing,pt able to feed self, spouse cooks, pt doesn't drive ?  ? ?  ?  ?OT Problem List: Decreased range of motion;Decreased strength;Decreased activity tolerance;Impaired balance (sitting and/or standing);Decreased cognition;Decreased safety awareness ?  ?   ?OT  Treatment/Interventions: Self-care/ADL training;Therapeutic exercise;Balance training;Therapeutic activities;DME and/or AE instruction  ?  ?OT Goals(Current goals can be found in the care plan section) Acute Rehab OT Goals ?Patient Stated Goal: home ?OT Goal Formulation: With patient ?Time For Goal Achievement: 02/03/22 ?Potential to Achieve Goals: Good ?ADL Goals ?Additional ADL Goal #1: pt will sequence 3 step task during ADL with minimal verbal cues in a nondistracting environment  ?OT Frequency: Min 2X/week ?  ? ?Co-evaluation   ?  ?  ?  ?  ? ?  ?AM-PAC OT "6 Clicks" Daily Activity     ?Outcome Measure Help from another person eating meals?: None ?Help from another person taking care of personal grooming?: A Little ?Help from another person toileting, which includes using toliet, bedpan, or urinal?: A Little ?Help from another person bathing (including washing, rinsing, drying)?: A Lot ?Help from another person to put on and taking off regular upper body clothing?: A Little ?Help from another person to put on and taking off regular lower body clothing?: A Lot ?6 Click Score: 17 ?  ?End of Session Nurse Communication: Mobility status ? ?Activity Tolerance: Other (comment) (limited by nausea and emesis) ?Patient left: in bed;with call bell/phone within reach;with bed alarm set;with family/visitor present ? ?OT Visit Diagnosis: Unsteadiness on feet (R26.81);Other abnormalities of gait and mobility (R26.89);Muscle weakness (generalized) (M62.81)  ?              ?  Time: 1453-1510 ?OT Time Calculation (min): 17 min ?Charges:  OT General Charges ?$OT Visit: 1 Visit ?OT Evaluation ?$OT Eval Moderate Complexity: 1 Mod ? ? ?[name] ?01/20/2022, 4:32 PM ?

## 2022-01-20 NOTE — Evaluation (Signed)
Physical Therapy Evaluation ?Patient Details ?Name: [name] ?MRN: [id number] ?DOB: 1950/10/27 ?Today's Date: 01/20/2022 ? ?History of Present Illness ? [name] is a 71 y.o. female with medical history significant of anoxic brain injury and Parkinson-like syndrome, COPD, HTN, IIDM, chronic neck pain and narcotic dependence, seizure disorder of medications,came with transient aphasia. imaging negative for stroke, suspect hypoxia ?  ?Clinical Impression ? Pt admitted with above. Pt with known anoxic BI with memory deficits and anxiety. Pt requiring constant verbal cues to complete tasks asked however did follow simple commands consistently. Pt with noted increased anxiety stating "I haven't had my ativan yet." Limiting ambulation tolerance today. Per spouse pt near baseline and is functioning at a level he can care for her at. Recommending HHPT to improve strength, balance, and co-ordination. Acute PT to cont to follow.   ?   ? ?Recommendations for follow up therapy are one component of a multi-disciplinary discharge planning process, led by the attending physician.  Recommendations may be updated based on patient status, additional functional criteria and insurance authorization. ? ?Follow Up Recommendations Home health PT ? ?  ?Assistance Recommended at Discharge Frequent or constant Supervision/Assistance  ?Patient can return home with the following ? A little help with walking and/or transfers;A little help with bathing/dressing/bathroom;Direct supervision/assist for medications management;Direct supervision/assist for financial management;Assist for transportation;Help with stairs or ramp for entrance;Assistance with cooking/housework ? ?  ?Equipment Recommendations None recommended by PT  ?Recommendations for Other Services ?    ?  ?Functional Status Assessment Patient has had a recent decline in their functional status and demonstrates the ability to make significant improvements in function in a  reasonable and predictable amount of time.  ? ?  ?Precautions / Restrictions Precautions ?Precautions: Fall ?Precaution Comments: memory deficits from ?Restrictions ?Weight Bearing Restrictions: No  ? ?  ? ?Mobility ? Bed Mobility ?Overal bed mobility: Needs Assistance ?Bed Mobility: Supine to Sit ?  ?  ?Supine to sit: Min assist, HOB elevated ?  ?  ?General bed mobility comments: max directional cues, pt able to bring self up to long sit, verbal cues to bring self to EOB, minA for safety ?  ? ?Transfers ?Overall transfer level: Needs assistance ?Equipment used: Rolling walker (2 wheels) ?Transfers: Sit to/from Stand, Bed to chair/wheelchair/BSC ?Sit to Stand: Min assist ?  ?Step pivot transfers: Min assist ?  ?  ?  ?General transfer comment: max directional verbal cues to complete, minA to power up, pt with noted anxiety requiring calming verbalc ues ?  ? ?Ambulation/Gait ?Ambulation/Gait assistance: Mod assist, +2 safety/equipment (husband followed with chair) ?Gait Distance (Feet): 20 Feet ?Assistive device: Rolling walker (2 wheels) ?Gait Pattern/deviations: Step-to pattern, Decreased step length - right, Decreased stance time - left, Decreased stride length ?Gait velocity: slow ?Gait velocity interpretation: <1.31 ft/sec, indicative of household ambulator ?  ?General Gait Details: vpt requested to don her shoes, verbal cues to stand up right, modA for walker management due to pushing far out in front of herself, pt with significantly large L LE step then step to with R foot, pt with onset of increased anxiety once in hallway and "froze" requiring max verbal cues to continue ambulating, pt with noted anxiety that hindered ambulation ? ?Stairs ?  ?  ?  ?  ?  ? ?Wheelchair Mobility ?  ? ?Modified Rankin (Stroke Patients Only) ?Modified Rankin (Stroke Patients Only) ?Pre-Morbid Rankin Score: Moderately severe disability ?Modified Rankin: Moderately severe disability ? ?  ? ?Balance Overall  balance assessment: Needs  assistance ?Sitting-balance support: Feet supported, No upper extremity supported ?Sitting balance-Leahy Scale: Fair ?  ?  ?Standing balance support: Bilateral upper extremity supported, During functional activity ?Standing balance-Leahy Scale: Poor ?Standing balance comment: dependent on RW ?  ?  ?  ?  ?  ?  ?  ?  ?  ?  ?  ?   ? ? ? ?Pertinent Vitals/Pain Pain Assessment ?Pain Assessment: Faces ?Faces Pain Scale: Hurts even more ?Pain Location: chronic low backpain ?Pain Descriptors / Indicators: Aching  ? ? ?Home Living Family/patient expects to be discharged to:: Private residence ?Living Arrangements: Spouse/significant other ?Available Help at Discharge: Family;Available 24 hours/day ?Type of Home: House ?Home Access: Ramped entrance (with1 step to enter home) ?  ?  ?  ?Home Layout: One level ?Home Equipment: Agricultural consultantolling Walker (2 wheels);Transport chair;Shower seat ?   ?  ?Prior Function Prior Level of Function : Needs assist ?  ?  ?  ?  ?  ?  ?Mobility Comments: uses RW during the day and transport chair in evening/night due to fatigue and being unbalanced ?ADLs Comments: spouse sets up clothes,supervision for dressing, assists with backside bathing,pt able to feed self, spouse cooks, pt doesn't drive ?  ? ? ?Hand Dominance  ? Dominant Hand: Right ? ?  ?Extremity/Trunk Assessment  ? Upper Extremity Assessment ?Upper Extremity Assessment: Generalized weakness ?  ? ?Lower Extremity Assessment ?Lower Extremity Assessment: Generalized weakness (known impaired c0-ordination) ?  ? ?Cervical / Trunk Assessment ?Cervical / Trunk Assessment: Kyphotic  ?Communication  ? Communication: No difficulties  ?Cognition Arousal/Alertness: Awake/alert ?[facility]: [facility] for tasks assessed/performed ?Overall Cognitive Status: History of cognitive impairments - at baseline ?  ?  ?  ?  ?  ?  ?  ?  ?  ?  ?  ?  ?  ?  ?  ?  ?General Comments: pt with old anoxic brain injury ?  ?  ? ?  ?General Comments General comments (skin  integrity, edema, etc.): pt used BSC x2, minA for hygiene, SpO2 >92% on RA t/o session ? ?  ?Exercises    ? ?Assessment/Plan  ?  ?PT Assessment Patient needs continued PT services  ?PT Problem List Decreased strength;Decreased activity tolerance;Decreased balance;Decreased mobility;Decreased coordination;Decreased cognition;Decreased knowledge of use of DME;Decreased safety awareness;Decreased range of motion ? ?   ?  ?PT Treatment Interventions DME instruction;Gait training;Stair training;Functional mobility training;Therapeutic activities;Therapeutic exercise;Balance training;Neuromuscular re-education   ? ?PT Goals (Current goals can be found in the Care Plan section)  ?Acute Rehab PT Goals ?Patient Stated Goal: home ?PT Goal Formulation: With patient/family ?Time For Goal Achievement: 02/03/22 ?Potential to Achieve Goals: Good ? ?  ?Frequency Min 3X/week ?  ? ? ?Co-evaluation   ?  ?  ?  ?  ? ? ?  ?AM-PAC PT "6 Clicks" Mobility  ?Outcome Measure Help needed turning from your back to your side while in a flat bed without using bedrails?: A Little ?Help needed moving from lying on your back to sitting on the side of a flat bed without using bedrails?: A Little ?Help needed moving to and from a bed to a chair (including a wheelchair)?: A Little ?Help needed standing up from a chair using your arms (e.g., wheelchair or bedside chair)?: A Little ?Help needed to walk in hospital room?: A Lot ?Help needed climbing 3-5 steps with a railing? : A Lot ?6 Click Score: 16 ? ?  ?End of Session Equipment Utilized During  Treatment: Gait belt ?Activity Tolerance:  (limited by anxiety) ?Patient left: in chair;with call bell/phone within reach;with family/visitor present ?Nurse Communication: Mobility status (SpO2 stats and request for ativan) ?PT Visit Diagnosis: Unsteadiness on feet (R26.81);Muscle weakness (generalized) (M62.81);Difficulty in walking, not elsewhere classified (R26.2) ?  ? ?Time: 1020-1106 ?PT Time Calculation  (min) (ACUTE ONLY): 46 min ? ? ?Charges:   PT Evaluation ?$PT Eval Moderate Complexity: 1 Mod ?PT Treatments ?$Gait Training: 8-22 mins ?$Therapeutic Activity: 8-22 mins ?  ?   ? ? ?Lewis Shock, PT, DPT ?Ac

## 2022-01-20 NOTE — Progress Notes (Signed)
?  Echocardiogram ?2D Echocardiogram has been performed. ? ?[name] ?01/20/2022, 9:55 AM ?

## 2022-01-20 NOTE — TOC Transition Note (Signed)
Transition of Care (TOC) - CM/SW Discharge Note ? ? ?Patient Details  ?Name: [name] ?MRN: [id number] ?Date of Birth: [birth year] ? ?Transition of Care (TOC) CM/SW Contact:  ?Lawerance Sabal, RN ?Phone Number: ?01/20/2022, 3:30 PM ? ? ?Clinical Narrative:    ?Patient active with [facility]. Services will resume after DC.  ? ? ?Final next level of care: Home w Home Health Services ?Barriers to Discharge: No Barriers Identified ? ? ?Patient Goals and CMS Choice ?  ?  ?  ? ?Discharge Placement ?  ?           ?  ?  ?  ?  ? ?Discharge Plan and Services ?  ?  ?           ?  ?  ?  ?  ?  ?  ?HH Agency: [facility] ?Date HH Agency Contacted: 01/20/22 ?Time HH Agency Contacted: 1529 ?Representative spoke with at [facility] Agency: Barbara Cower ? ?Social Determinants of Health (SDOH) Interventions ?  ? ? ?Readmission Risk Interventions ?   ? View : No data to display.  ?  ?  ?  ? ? ? ? ? ?

## 2022-01-20 NOTE — Progress Notes (Signed)
Pt discharged to home. DC instructions given with husband at bedside,. No concerns voiced. DC orders were in; but pt with episode of vomiting. MD notified for antiemetic. Order placed. Pt medicated. Waited for symptom to resolve prior to discharging. Symptoms resolved. Pt discharged. Pt Left unit in wheelchair pushed by this writer accompanied by husband. Left in stable condition. ?

## 2022-01-20 NOTE — Discharge Summary (Signed)
?Physician Discharge Summary ?  ?Patient: [name] MRN: [id number] DOB: June 21, 1951  ?Admit date:     01/19/2022  ?Discharge date: 01/20/22  ?Discharge Physician: Corrie Mckusick Layali Freund  ? ?PCP: Plotnikov, Evie Lacks, MD  ? ?Recommendations at discharge:  ? ?Follow-up with your primary care patient as outpatient. ? ?Discharge Diagnoses: ?Principal Problem: ?  TIA (transient ischemic attack) ?Active Problems: ?  DM2 (diabetes mellitus, type 2) ([place]) ?  COPD (chronic obstructive pulmonary disease) ([place]) ?  Hypomagnesemia ? ?Resolved Problems: ?  * No resolved hospital problems. * ? ?Hospital Course: ?[name] is a 71 y.o. female with past medical history significant of anoxic brain injury and Parkinson-like syndrome, COPD, HTN, IIDM, chronic neck pain and narcotic dependence, seizure disorder of medications, pressure hospital was feelings of dizziness followed by aphasia which resolved by itself.  Did not have any weakness of body parts.  Patient's family reported similar symptoms when she had episode of UTI in the past.  In the ED patient was noted to have low magnesium level at 1.2.  EKG showed frequent PVCs.  MRI was performed which showed negative for stroke.  Patient was then placed in for observation. ? ?Assessment/Plan ? ?Principal Problem: ?  TIA (transient ischemic attack) ?Active Problems: ?  DM2 (diabetes mellitus, type 2) ([place]) ?  COPD (chronic obstructive pulmonary disease) ([place]) ?  Hypomagnesemia ?  ?Acute expressive aphasia, TIA likely ?MRI of the brain was negative for stroke.  CT angiogram of the head and neck did not show any emergent large vessel occlusion.  Lipid panel noted with elevated cholesterol at 218 with elevated LDL at 108.  Vitamin B12 was at 181.  Urinalysis was negative for UTI.  COVID influenza was negative.  D-dimer was negative.  Continue aspirin and will add Lipitor on discharge.  Also prescribed vitamin B12 on discharge ? ?Hypomagnesemia ?Improved after replacement.  Magnesium 1.8  today. ?  ?Type 2 diabetes with hyperglycemia ?Continue Farxiga, sliding scale.  Last hemoglobin A1c 5 months back was 7.7. ?  ?Parkinson syndrome ?On Sinemet.  Controlled ?  ?Chronic narcotic dependence ?On Dilaudid and Ativan at home.  We will continue on discharge.  Patient and the patient's husband at bedside aware of the side effects of these medications. ? ?Seizure disorder ?Not on medications for several years now. ? ?Frequent PVCs ?Continue metoprolol  ? ?Consultants: None ? ?Procedures performed: None ? ?Disposition: Home with home health ? ?Diet recommendation:  ?Discharge Diet Orders (From admission, onward)  ? ?  Start     Ordered  ? 01/20/22 0000  Diet - low sodium heart healthy       ? 01/20/22 1500  ? 01/20/22 0000  Diet Carb Modified       ? 01/20/22 1500  ? ?  ?  ? ?  ? ?Cardiac and Carb modified diet ?DISCHARGE MEDICATION: ?Allergies as of 01/20/2022   ? ?   Reactions  ? Asa [aspirin] Other (See Comments)  ? "stomach burning", "I can take baby aspirin"  ? Prednisone Other (See Comments)  ? MD told it gavepancreatitis  and almost died  ? ?  ? ?  ?Medication List  ?  ? ?STOP taking these medications   ? ?azithromycin 250 MG tablet ?Commonly known as: Zithromax Z-Pak ?  ? ?  ? ?TAKE these medications   ? ?albuterol 108 (90 Base) MCG/ACT inhaler ?Commonly known as: VENTOLIN HFA ?Inhale 1-2 puffs into the lungs every 4 (four) hours as needed for  wheezing or shortness of breath. ?  ?aspirin 81 MG EC tablet ?Take 81 mg by mouth daily. ?  ?atorvastatin 40 MG tablet ?Commonly known as: LIPITOR ?Take 1 tablet (40 mg total) by mouth daily. ?Start taking on: January 21, 2022 ?  ?carbidopa-levodopa 25-250 MG tablet ?Commonly known as: SINEMET IR ?TAKE 1 TABLET BY MOUTH 3 TIMES A DAY. ?  ?dapagliflozin propanediol 5 MG Tabs tablet ?Commonly known as: [place] ?Take 1 tablet (5 mg total) by mouth daily before breakfast. ?  ?diclofenac Sodium 1 % Gel ?Commonly known as: Voltaren ?Apply 1 application topically 4 (four)  times daily. ?  ?HYDROmorphone 2 MG tablet ?Commonly known as: DILAUDID ?Take 1 tablet (2 mg total) by mouth daily. ?  ?LORazepam 2 MG tablet ?Commonly known as: ATIVAN ?Take 1 tablet (2 mg total) by mouth every 8 (eight) hours as needed. for anxiety ?  ?metFORMIN 500 MG tablet ?Commonly known as: GLUCOPHAGE ?Take 1 tablet (500 mg total) by mouth 2 (two) times daily with a meal. ?  ?metoprolol tartrate 25 MG tablet ?Commonly known as: LOPRESSOR ?TAKE 1/2 TABLET TWICE DAILY. ?  ?mirtazapine 15 MG tablet ?Commonly known as: REMERON ?TAKE 1 TABLET BY MOUTH AT BEDTIME 6-8PM ?  ?multivitamin-lutein Caps capsule ?Take 1 capsule by mouth daily. ?  ?OneTouch Delica Lancets 83M Misc ?Use to help check blood sugars once a day. Dx E11.9 ?  ?OneTouch Verio test strip ?Generic drug: glucose blood ?Use to check blood sugars twice a day. Dx E11.9 ?  ?OneTouch Verio w/Device Kit ?1 Units by Does not apply route daily as needed. ?  ?promethazine-dextromethorphan 6.25-15 MG/5ML syrup ?Commonly known as: PROMETHAZINE-DM ?Take 5 mLs by mouth 4 (four) times daily as needed for cough. ?  ?repaglinide 2 MG tablet ?Commonly known as: PRANDIN ?Take 2 tablets (4 mg total) by mouth 3 (three) times daily before meals. ?  ?tamsulosin 0.4 MG Caps capsule ?Commonly known as: FLOMAX ?TAKE (1) CAPSULE DAILY. ?What changed: See the new instructions. ?  ?vitamin B-12 1000 MCG tablet ?Commonly known as: CYANOCOBALAMIN ?Take 1 tablet (1,000 mcg total) by mouth daily. ?  ?Vitamin D3 50 MCG (2000 UT) capsule ?Take 1 capsule (2,000 Units total) by mouth daily. ?  ? ?  ? ?Subjective ?Today, patient was seen and examined at bedside.  + Nausea.  Patient has improved symptoms but still feels little dizzy.  Seen by physical therapy ? ?Discharge Exam: ?Danley Danker Weights  ? 01/19/22 1242  ?Weight: 59.4 kg  ? ? ?  01/20/2022  ?  1:56 PM 01/20/2022  ?  8:14 AM 01/20/2022  ?  4:11 AM  ?Vitals with BMI  ?Systolic 196 222 979  ?Diastolic 63 60 65  ?Pulse 77 74 76  ?   ?General:  Average built, not in obvious distress ?HENT:   No scleral pallor or icterus noted. Oral mucosa is moist.  ?Chest:  Clear breath sounds.  Diminished breath sounds bilaterally. No crackles or wheezes.  ?CVS: S1 &S2 heard. No murmur.  Regular rate and rhythm. ?Abdomen: Soft, nontender, nondistended.  Bowel sounds are heard.   ?Extremities: No cyanosis, clubbing or edema.  Peripheral pulses are palpable. ?Psych: Alert, awake and oriented, normal mood ?CNS:  No cranial nerve deficits.  Power equal in all extremities.   ?Skin: Warm and dry.  No rashes noted. ? ?Condition at discharge: good ? ?The results of significant diagnostics from this hospitalization (including imaging, microbiology, ancillary and laboratory) are listed below for reference.  ? ?Imaging  Studies: ?CT ANGIO HEAD W OR WO CONTRAST ? ?Result Date: 01/19/2022 ?CLINICAL DATA:  Stroke follow-up EXAM: CT ANGIOGRAPHY HEAD AND NECK TECHNIQUE: Multidetector CT imaging of the head and neck was performed using the standard protocol during bolus administration of intravenous contrast. Multiplanar CT image reconstructions and MIPs were obtained to evaluate the vascular anatomy. Carotid stenosis measurements (when applicable) are obtained utilizing NASCET criteria, using the distal internal carotid diameter as the denominator. RADIATION DOSE REDUCTION: This exam was performed according to the departmental dose-optimization program which includes automated exposure control, adjustment of the mA and/or kV according to patient size and/or use of iterative reconstruction technique. CONTRAST:  75m OMNIPAQUE IOHEXOL 350 MG/ML SOLN COMPARISON:  None. FINDINGS: CT HEAD FINDINGS Brain: There is no mass, hemorrhage or extra-axial collection. The size and configuration of the ventricles and extra-axial CSF spaces are normal. There is no acute or chronic infarction. There is hypoattenuation of the periventricular white matter, most commonly indicating chronic  ischemic microangiopathy. Skull: The visualized skull base, calvarium and extracranial soft tissues are normal. Sinuses/Orbits: No fluid levels or advanced mucosal thickening of the visualized paranasal sinuses. No

## 2022-01-22 LAB — URINE CULTURE: Culture: NO GROWTH

## 2022-01-24 DIAGNOSIS — G894 Chronic pain syndrome: Secondary | ICD-10-CM | POA: Diagnosis not present

## 2022-01-24 DIAGNOSIS — F32A Depression, unspecified: Secondary | ICD-10-CM | POA: Diagnosis not present

## 2022-01-24 DIAGNOSIS — M47816 Spondylosis without myelopathy or radiculopathy, lumbar region: Secondary | ICD-10-CM | POA: Diagnosis not present

## 2022-01-24 DIAGNOSIS — R32 Unspecified urinary incontinence: Secondary | ICD-10-CM | POA: Diagnosis not present

## 2022-01-29 ENCOUNTER — Encounter: Payer: Self-pay | Admitting: Internal Medicine

## 2022-01-29 ENCOUNTER — Ambulatory Visit (INDEPENDENT_AMBULATORY_CARE_PROVIDER_SITE_OTHER): Payer: Medicare Other | Admitting: Internal Medicine

## 2022-01-29 VITALS — BP 92/52 | HR 79 | Temp 98.3°F | Ht 65.0 in | Wt 135.0 lb

## 2022-01-29 DIAGNOSIS — E1100 Type 2 diabetes mellitus with hyperosmolarity without nonketotic hyperglycemic-hyperosmolar coma (NKHHC): Secondary | ICD-10-CM

## 2022-01-29 DIAGNOSIS — E538 Deficiency of other specified B group vitamins: Secondary | ICD-10-CM

## 2022-01-29 DIAGNOSIS — R269 Unspecified abnormalities of gait and mobility: Secondary | ICD-10-CM

## 2022-01-29 DIAGNOSIS — M1991 Primary osteoarthritis, unspecified site: Secondary | ICD-10-CM | POA: Diagnosis not present

## 2022-01-29 DIAGNOSIS — F418 Other specified anxiety disorders: Secondary | ICD-10-CM

## 2022-01-29 DIAGNOSIS — J449 Chronic obstructive pulmonary disease, unspecified: Secondary | ICD-10-CM

## 2022-01-29 DIAGNOSIS — R634 Abnormal weight loss: Secondary | ICD-10-CM | POA: Diagnosis not present

## 2022-01-29 DIAGNOSIS — Z8673 Personal history of transient ischemic attack (TIA), and cerebral infarction without residual deficits: Secondary | ICD-10-CM

## 2022-01-29 MED ORDER — B COMPLEX PLUS PO TABS: 1.0000 | ORAL_TABLET | Freq: Every day | ORAL | 3 refills | Status: AC

## 2022-01-29 NOTE — Assessment & Plan Note (Signed)
Cont on Lorazepam prn  Potential benefits of a long term benzodiazepines  use as well as potential risks  and complications were explained to the patient and were aknowledged. 

## 2022-01-29 NOTE — Assessment & Plan Note (Signed)
Cont on Prandin, Metformin, Farxiga ?

## 2022-01-29 NOTE — Assessment & Plan Note (Signed)
Recent aphasia sx's - no CVA on the MRI ?On Lipitor, ASA ?

## 2022-01-29 NOTE — Assessment & Plan Note (Signed)
Wt Readings from Last 3 Encounters:  ?01/29/22 135 lb (61.2 kg)  ?01/19/22 131 lb (59.4 kg)  ?12/14/21 131 lb (59.4 kg)  ? ? ? ?

## 2022-01-29 NOTE — Progress Notes (Signed)
? ?Subjective:  ?Patient ID: [name], female    DOB: 01-08-1951  Age: 71 y.o. MRN: [id number] ? ?CC: No chief complaint on file. ? ? ?HPI ?[name] presents for MS changes - probable TIA w/aphasia, low magnesium, HTN ? ?Per hx: ? ?" ?Admit date:     01/19/2022  ?Discharge date: 01/20/22  ?Discharge Physician: Corrie Mckusick Pokhrel  ?  ?PCP: Theus Espin, Evie Lacks, MD  ?  ?Recommendations at discharge:  ?  ?Follow-up with your primary care patient as outpatient. ?  ?Discharge Diagnoses: ?Principal Problem: ?  TIA (transient ischemic attack) ?Active Problems: ?  DM2 (diabetes mellitus, type 2) ([place]) ?  COPD (chronic obstructive pulmonary disease) ([place]) ?  Hypomagnesemia ?  ?Resolved Problems: ?  * No resolved hospital problems. * ?  ?Hospital Course: ?[name] is a 71 y.o. female with past medical history significant of anoxic brain injury and Parkinson-like syndrome, COPD, HTN, IIDM, chronic neck pain and narcotic dependence, seizure disorder of medications, pressure hospital was feelings of dizziness followed by aphasia which resolved by itself.  Did not have any weakness of body parts.  Patient's family reported similar symptoms when she had episode of UTI in the past.  In the ED patient was noted to have low magnesium level at 1.2.  EKG showed frequent PVCs.  MRI was performed which showed negative for stroke.  Patient was then placed in for observation. ?  ?Assessment/Plan ?  ?Principal Problem: ?  TIA (transient ischemic attack) ?Active Problems: ?  DM2 (diabetes mellitus, type 2) ([place]) ?  COPD (chronic obstructive pulmonary disease) ([place]) ?  Hypomagnesemia ?  ?Acute expressive aphasia, TIA likely ?MRI of the brain was negative for stroke.  CT angiogram of the head and neck did not show any emergent large vessel occlusion.  Lipid panel noted with elevated cholesterol at 218 with elevated LDL at 108.  Vitamin B12 was at 181.  Urinalysis was negative for UTI.  COVID influenza was negative.  D-dimer was  negative.  Continue aspirin and will add Lipitor on discharge.  Also prescribed vitamin B12 on discharge ?  ?Hypomagnesemia ?Improved after replacement.  Magnesium 1.8 today. ?  ?Type 2 diabetes with hyperglycemia ?Continue Farxiga, sliding scale.  Last hemoglobin A1c 5 months back was 7.7. ?  ?Parkinson syndrome ?On Sinemet.  Controlled ?  ?Chronic narcotic dependence ?On Dilaudid and Ativan at home.  We will continue on discharge.  Patient and the patient's husband at bedside aware of the side effects of these medications. ?  ?Seizure disorder ?Not on medications for several years now. ?  ?Frequent PVCs ?Continue metoprolol " ? ?Outpatient Medications Prior to Visit  ?Medication Sig Dispense Refill  ? albuterol (VENTOLIN HFA) 108 (90 Base) MCG/ACT inhaler Inhale 1-2 puffs into the lungs every 4 (four) hours as needed for wheezing or shortness of breath. 56 g 3  ? aspirin 81 MG EC tablet Take 81 mg by mouth daily.    ? atorvastatin (LIPITOR) 40 MG tablet Take 1 tablet (40 mg total) by mouth daily. 30 tablet 2  ? Blood Glucose Monitoring Suppl (ONETOUCH VERIO) w/Device KIT 1 Units by Does not apply route daily as needed. 1 kit 1  ? carbidopa-levodopa (SINEMET IR) 25-250 MG tablet TAKE 1 TABLET BY MOUTH 3 TIMES A DAY. 270 tablet 3  ? Cholecalciferol (VITAMIN D3) 50 MCG (2000 UT) capsule Take 1 capsule (2,000 Units total) by mouth daily. 100 capsule 3  ? glucose blood (ONETOUCH VERIO) test strip  Use to check blood sugars twice a day. Dx E11.9 50 each 11  ? HYDROmorphone (DILAUDID) 2 MG tablet Take 1 tablet (2 mg total) by mouth daily.    ? LORazepam (ATIVAN) 2 MG tablet Take 1 tablet (2 mg total) by mouth every 8 (eight) hours as needed. for anxiety 90 tablet 3  ? metFORMIN (GLUCOPHAGE) 500 MG tablet Take 1 tablet (500 mg total) by mouth 2 (two) times daily with a meal. 180 tablet 3  ? metoprolol tartrate (LOPRESSOR) 25 MG tablet TAKE 1/2 TABLET TWICE DAILY. 90 tablet 3  ? mirtazapine (REMERON) 15 MG tablet TAKE 1  TABLET BY MOUTH AT BEDTIME 6-8PM 90 tablet 3  ? multivitamin-lutein (OCUVITE-LUTEIN) CAPS capsule Take 1 capsule by mouth daily.    ? OneTouch Delica Lancets 21J MISC Use to help check blood sugars once a day. Dx E11.9 50 each 11  ? promethazine-dextromethorphan (PROMETHAZINE-DM) 6.25-15 MG/5ML syrup Take 5 mLs by mouth 4 (four) times daily as needed for cough. 240 mL 0  ? repaglinide (PRANDIN) 2 MG tablet Take 2 tablets (4 mg total) by mouth 3 (three) times daily before meals. 360 tablet 3  ? tamsulosin (FLOMAX) 0.4 MG CAPS capsule TAKE (1) CAPSULE DAILY. (Patient taking differently: 0.4 mg at bedtime.) 90 capsule 3  ? vitamin B-12 (CYANOCOBALAMIN) 1000 MCG tablet Take 1 tablet (1,000 mcg total) by mouth daily. 100 tablet 0  ? dapagliflozin propanediol (FARXIGA) 5 MG TABS tablet Take 1 tablet (5 mg total) by mouth daily before breakfast. (Patient not taking: Reported on 01/19/2022) 30 tablet 5  ? diclofenac Sodium (VOLTAREN) 1 % GEL Apply 1 application topically 4 (four) times daily. (Patient not taking: Reported on 01/19/2022) 100 g 3  ? ?No facility-administered medications prior to visit.  ? ? ?ROS: ?Review of Systems  ?Constitutional:  Positive for fatigue. Negative for activity change, appetite change, chills and unexpected weight change.  ?HENT:  Negative for congestion, mouth sores and sinus pressure.   ?Eyes:  Negative for visual disturbance.  ?Respiratory:  Negative for cough and chest tightness.   ?Gastrointestinal:  Negative for abdominal pain and nausea.  ?Genitourinary:  Negative for difficulty urinating, frequency and vaginal pain.  ?Musculoskeletal:  Positive for arthralgias and gait problem. Negative for back pain.  ?Skin:  Negative for pallor and rash.  ?Neurological:  Positive for weakness. Negative for dizziness, tremors, seizures, numbness and headaches.  ?Psychiatric/Behavioral:  Positive for dysphoric mood. Negative for behavioral problems, confusion, hallucinations, sleep disturbance and  suicidal ideas. The patient is nervous/anxious.   ? ?Objective:  ?BP (!) 92/52 (BP Location: Left Arm, Patient Position: Sitting, Cuff Size: Normal)   Pulse 79   Temp 98.3 ?F (36.8 ?C) (Oral)   Ht '5\' 5"'  (1.651 m)   Wt 135 lb (61.2 kg)   SpO2 90%   BMI 22.47 kg/m?  ? ?BP Readings from Last 3 Encounters:  ?01/29/22 (!) 92/52  ?01/20/22 121/63  ?12/14/21 (!) 90/50  ? ? ?Wt Readings from Last 3 Encounters:  ?01/29/22 135 lb (61.2 kg)  ?01/19/22 131 lb (59.4 kg)  ?12/14/21 131 lb (59.4 kg)  ? ? ?Physical Exam ?Constitutional:   ?   General: She is not in acute distress. ?   Appearance: Normal appearance. She is well-developed.  ?HENT:  ?   Head: Normocephalic.  ?   Right Ear: External ear normal.  ?   Left Ear: External ear normal.  ?   Nose: Nose normal.  ?Eyes:  ?   General:     ?  Right eye: No discharge.     ?   Left eye: No discharge.  ?   Conjunctiva/sclera: Conjunctivae normal.  ?   Pupils: Pupils are equal, round, and reactive to light.  ?Neck:  ?   Thyroid: No thyromegaly.  ?   Vascular: No JVD.  ?   Trachea: No tracheal deviation.  ?Cardiovascular:  ?   Rate and Rhythm: Normal rate and regular rhythm.  ?   Heart sounds: Normal heart sounds.  ?Pulmonary:  ?   Effort: No respiratory distress.  ?   Breath sounds: No stridor. No wheezing.  ?Abdominal:  ?   General: Bowel sounds are normal. There is no distension.  ?   Palpations: Abdomen is soft. There is no mass.  ?   Tenderness: There is no abdominal tenderness. There is no guarding or rebound.  ?Musculoskeletal:     ?   General: No tenderness.  ?   Cervical back: Normal range of motion and neck supple. No rigidity.  ?Lymphadenopathy:  ?   Cervical: No cervical adenopathy.  ?Skin: ?   Findings: No erythema or rash.  ?Neurological:  ?   Mental Status: Mental status is at baseline.  ?   Cranial Nerves: No cranial nerve deficit.  ?   Motor: Weakness present. No abnormal muscle tone.  ?   Coordination: Coordination normal.  ?   Gait: Gait abnormal.  ?   Deep  Tendon Reflexes: Reflexes normal.  ?Psychiatric:     ?   Behavior: Behavior normal.     ?   Thought Content: Thought content normal.     ?   Judgment: Judgment normal.  ?In a w/c ? ?Lab Results  ?Component Value Date  ? WBC

## 2022-01-29 NOTE — Patient Instructions (Signed)
Blue-Emu cream -- use 2-3 times a day ? ?

## 2022-01-29 NOTE — Assessment & Plan Note (Signed)
Blue-Emu cream was recommended to use 2-3 times a day ? ?

## 2022-01-29 NOTE — Assessment & Plan Note (Signed)
On B12 

## 2022-01-29 NOTE — Assessment & Plan Note (Signed)
In a w/c 

## 2022-01-29 NOTE — Assessment & Plan Note (Signed)
On Trelegy °

## 2022-02-02 DIAGNOSIS — Z20822 Contact with and (suspected) exposure to covid-19: Secondary | ICD-10-CM | POA: Diagnosis not present

## 2022-02-07 ENCOUNTER — Telehealth: Payer: Self-pay | Admitting: Internal Medicine

## 2022-02-07 NOTE — Telephone Encounter (Signed)
[name] with [facility] called requesting home health PT orders for 2 times per week for 3 weeks and 1 time per week for 6 weeks. Her call back number is [phone] ?

## 2022-02-08 ENCOUNTER — Other Ambulatory Visit: Payer: Self-pay | Admitting: Internal Medicine

## 2022-02-08 NOTE — Telephone Encounter (Signed)
OK. Thx

## 2022-02-14 ENCOUNTER — Ambulatory Visit: Payer: Medicare Other | Admitting: Internal Medicine

## 2022-02-16 DIAGNOSIS — M1990 Unspecified osteoarthritis, unspecified site: Secondary | ICD-10-CM

## 2022-02-16 DIAGNOSIS — Z9181 History of falling: Secondary | ICD-10-CM

## 2022-02-16 DIAGNOSIS — G8929 Other chronic pain: Secondary | ICD-10-CM

## 2022-02-16 DIAGNOSIS — I700 Atherosclerosis of aorta: Secondary | ICD-10-CM

## 2022-02-16 DIAGNOSIS — G459 Transient cerebral ischemic attack, unspecified: Secondary | ICD-10-CM

## 2022-02-16 DIAGNOSIS — F32A Depression, unspecified: Secondary | ICD-10-CM

## 2022-02-16 DIAGNOSIS — E785 Hyperlipidemia, unspecified: Secondary | ICD-10-CM

## 2022-02-16 DIAGNOSIS — Z7984 Long term (current) use of oral hypoglycemic drugs: Secondary | ICD-10-CM

## 2022-02-16 DIAGNOSIS — G20 Parkinson's disease: Secondary | ICD-10-CM

## 2022-02-16 DIAGNOSIS — J449 Chronic obstructive pulmonary disease, unspecified: Secondary | ICD-10-CM

## 2022-02-16 DIAGNOSIS — E1165 Type 2 diabetes mellitus with hyperglycemia: Secondary | ICD-10-CM

## 2022-02-16 DIAGNOSIS — E1140 Type 2 diabetes mellitus with diabetic neuropathy, unspecified: Secondary | ICD-10-CM

## 2022-02-16 DIAGNOSIS — Z79891 Long term (current) use of opiate analgesic: Secondary | ICD-10-CM

## 2022-02-16 DIAGNOSIS — G40909 Epilepsy, unspecified, not intractable, without status epilepticus: Secondary | ICD-10-CM

## 2022-02-16 DIAGNOSIS — I10 Essential (primary) hypertension: Secondary | ICD-10-CM

## 2022-02-16 DIAGNOSIS — E538 Deficiency of other specified B group vitamins: Secondary | ICD-10-CM

## 2022-02-16 DIAGNOSIS — Z7982 Long term (current) use of aspirin: Secondary | ICD-10-CM

## 2022-02-16 DIAGNOSIS — Z87440 Personal history of urinary (tract) infections: Secondary | ICD-10-CM

## 2022-02-16 DIAGNOSIS — F419 Anxiety disorder, unspecified: Secondary | ICD-10-CM

## 2022-02-16 DIAGNOSIS — I493 Ventricular premature depolarization: Secondary | ICD-10-CM

## 2022-02-16 DIAGNOSIS — K219 Gastro-esophageal reflux disease without esophagitis: Secondary | ICD-10-CM

## 2022-02-16 DIAGNOSIS — Z87891 Personal history of nicotine dependence: Secondary | ICD-10-CM

## 2022-02-16 DIAGNOSIS — F1120 Opioid dependence, uncomplicated: Secondary | ICD-10-CM

## 2022-02-16 DIAGNOSIS — Z993 Dependence on wheelchair: Secondary | ICD-10-CM

## 2022-02-16 DIAGNOSIS — G931 Anoxic brain damage, not elsewhere classified: Secondary | ICD-10-CM

## 2022-02-21 ENCOUNTER — Telehealth: Payer: Self-pay

## 2022-02-21 DIAGNOSIS — M47816 Spondylosis without myelopathy or radiculopathy, lumbar region: Secondary | ICD-10-CM | POA: Diagnosis not present

## 2022-02-21 DIAGNOSIS — G894 Chronic pain syndrome: Secondary | ICD-10-CM | POA: Diagnosis not present

## 2022-02-21 DIAGNOSIS — F32A Depression, unspecified: Secondary | ICD-10-CM | POA: Diagnosis not present

## 2022-02-21 DIAGNOSIS — R32 Unspecified urinary incontinence: Secondary | ICD-10-CM | POA: Diagnosis not present

## 2022-02-21 NOTE — Telephone Encounter (Signed)
Kelly calling requesting VO for [facility] PT 1 time a week for 1 week 2 times a week for 3 weeks  Tresa Endo CB [phone]

## 2022-02-23 NOTE — Telephone Encounter (Signed)
Okay.  Thanks.

## 2022-02-27 NOTE — Telephone Encounter (Signed)
Called [name] there was no answer LMOM w/MD response.Marland KitchenRaechel Chute

## 2022-03-06 ENCOUNTER — Other Ambulatory Visit: Payer: Self-pay | Admitting: Internal Medicine

## 2022-03-21 NOTE — Telephone Encounter (Signed)
[name] from [employer] requesting orders for [facility] for 1x wk for 8 wks.    Stated ok to leave messege on vm.

## 2022-03-23 NOTE — Telephone Encounter (Signed)
LVM with verbal ok per Dr. Posey Rea.

## 2022-03-23 NOTE — Telephone Encounter (Signed)
Okay.  Thanks.

## 2022-03-26 DIAGNOSIS — G894 Chronic pain syndrome: Secondary | ICD-10-CM | POA: Diagnosis not present

## 2022-03-26 DIAGNOSIS — F32A Depression, unspecified: Secondary | ICD-10-CM | POA: Diagnosis not present

## 2022-03-26 DIAGNOSIS — M47816 Spondylosis without myelopathy or radiculopathy, lumbar region: Secondary | ICD-10-CM | POA: Diagnosis not present

## 2022-03-26 DIAGNOSIS — R32 Unspecified urinary incontinence: Secondary | ICD-10-CM | POA: Diagnosis not present

## 2022-04-19 DIAGNOSIS — Z79891 Long term (current) use of opiate analgesic: Secondary | ICD-10-CM

## 2022-04-19 DIAGNOSIS — Z993 Dependence on wheelchair: Secondary | ICD-10-CM

## 2022-04-19 DIAGNOSIS — I493 Ventricular premature depolarization: Secondary | ICD-10-CM

## 2022-04-19 DIAGNOSIS — G20 Parkinson's disease: Secondary | ICD-10-CM | POA: Diagnosis not present

## 2022-04-19 DIAGNOSIS — J449 Chronic obstructive pulmonary disease, unspecified: Secondary | ICD-10-CM | POA: Diagnosis not present

## 2022-04-19 DIAGNOSIS — M1990 Unspecified osteoarthritis, unspecified site: Secondary | ICD-10-CM | POA: Diagnosis not present

## 2022-04-19 DIAGNOSIS — E1165 Type 2 diabetes mellitus with hyperglycemia: Secondary | ICD-10-CM | POA: Diagnosis not present

## 2022-04-19 DIAGNOSIS — G931 Anoxic brain damage, not elsewhere classified: Secondary | ICD-10-CM | POA: Diagnosis not present

## 2022-04-19 DIAGNOSIS — G40909 Epilepsy, unspecified, not intractable, without status epilepticus: Secondary | ICD-10-CM | POA: Diagnosis not present

## 2022-04-19 DIAGNOSIS — E1140 Type 2 diabetes mellitus with diabetic neuropathy, unspecified: Secondary | ICD-10-CM | POA: Diagnosis not present

## 2022-04-19 DIAGNOSIS — K219 Gastro-esophageal reflux disease without esophagitis: Secondary | ICD-10-CM | POA: Diagnosis not present

## 2022-04-19 DIAGNOSIS — F1120 Opioid dependence, uncomplicated: Secondary | ICD-10-CM

## 2022-04-19 DIAGNOSIS — I10 Essential (primary) hypertension: Secondary | ICD-10-CM | POA: Diagnosis not present

## 2022-04-19 DIAGNOSIS — Z7982 Long term (current) use of aspirin: Secondary | ICD-10-CM

## 2022-04-19 DIAGNOSIS — Z7984 Long term (current) use of oral hypoglycemic drugs: Secondary | ICD-10-CM

## 2022-04-19 DIAGNOSIS — Z87891 Personal history of nicotine dependence: Secondary | ICD-10-CM

## 2022-04-19 DIAGNOSIS — Z87440 Personal history of urinary (tract) infections: Secondary | ICD-10-CM

## 2022-04-19 DIAGNOSIS — Z9181 History of falling: Secondary | ICD-10-CM

## 2022-04-19 DIAGNOSIS — E538 Deficiency of other specified B group vitamins: Secondary | ICD-10-CM

## 2022-04-19 DIAGNOSIS — E785 Hyperlipidemia, unspecified: Secondary | ICD-10-CM | POA: Diagnosis not present

## 2022-04-19 DIAGNOSIS — I700 Atherosclerosis of aorta: Secondary | ICD-10-CM | POA: Diagnosis not present

## 2022-04-19 DIAGNOSIS — F419 Anxiety disorder, unspecified: Secondary | ICD-10-CM

## 2022-04-19 DIAGNOSIS — F32A Depression, unspecified: Secondary | ICD-10-CM

## 2022-04-19 DIAGNOSIS — G8929 Other chronic pain: Secondary | ICD-10-CM | POA: Diagnosis not present

## 2022-04-23 DIAGNOSIS — G894 Chronic pain syndrome: Secondary | ICD-10-CM | POA: Diagnosis not present

## 2022-04-23 DIAGNOSIS — R32 Unspecified urinary incontinence: Secondary | ICD-10-CM | POA: Diagnosis not present

## 2022-04-23 DIAGNOSIS — F32A Depression, unspecified: Secondary | ICD-10-CM | POA: Diagnosis not present

## 2022-04-23 DIAGNOSIS — M47816 Spondylosis without myelopathy or radiculopathy, lumbar region: Secondary | ICD-10-CM | POA: Diagnosis not present

## 2022-04-29 ENCOUNTER — Other Ambulatory Visit: Payer: Self-pay | Admitting: Internal Medicine

## 2022-04-30 ENCOUNTER — Telehealth: Payer: Self-pay | Admitting: *Deleted

## 2022-04-30 MED ORDER — ATORVASTATIN CALCIUM 40 MG PO TABS: 40.0000 mg | ORAL_TABLET | Freq: Every day | ORAL | 0 refills | Status: DC

## 2022-04-30 NOTE — Telephone Encounter (Signed)
Rec'd request for Atorvastatin.Janet KitchenShearon Jackson

## 2022-05-01 ENCOUNTER — Ambulatory Visit: Payer: Medicare Other | Admitting: Internal Medicine

## 2022-05-04 DIAGNOSIS — E119 Type 2 diabetes mellitus without complications: Secondary | ICD-10-CM | POA: Diagnosis not present

## 2022-05-04 DIAGNOSIS — N281 Cyst of kidney, acquired: Secondary | ICD-10-CM | POA: Diagnosis not present

## 2022-05-04 DIAGNOSIS — G8929 Other chronic pain: Secondary | ICD-10-CM | POA: Diagnosis present

## 2022-05-04 DIAGNOSIS — Z741 Need for assistance with personal care: Secondary | ICD-10-CM | POA: Diagnosis not present

## 2022-05-04 DIAGNOSIS — E785 Hyperlipidemia, unspecified: Secondary | ICD-10-CM | POA: Diagnosis present

## 2022-05-04 DIAGNOSIS — M6281 Muscle weakness (generalized): Secondary | ICD-10-CM | POA: Diagnosis not present

## 2022-05-04 DIAGNOSIS — G20 Parkinson's disease: Secondary | ICD-10-CM | POA: Diagnosis present

## 2022-05-04 DIAGNOSIS — Z743 Need for continuous supervision: Secondary | ICD-10-CM | POA: Diagnosis not present

## 2022-05-04 DIAGNOSIS — N3289 Other specified disorders of bladder: Secondary | ICD-10-CM | POA: Diagnosis not present

## 2022-05-04 DIAGNOSIS — E1165 Type 2 diabetes mellitus with hyperglycemia: Secondary | ICD-10-CM | POA: Diagnosis present

## 2022-05-04 DIAGNOSIS — R5383 Other fatigue: Secondary | ICD-10-CM | POA: Diagnosis not present

## 2022-05-04 DIAGNOSIS — M48062 Spinal stenosis, lumbar region with neurogenic claudication: Secondary | ICD-10-CM | POA: Diagnosis not present

## 2022-05-04 DIAGNOSIS — R531 Weakness: Secondary | ICD-10-CM | POA: Diagnosis present

## 2022-05-04 DIAGNOSIS — R2689 Other abnormalities of gait and mobility: Secondary | ICD-10-CM | POA: Diagnosis not present

## 2022-05-04 DIAGNOSIS — R627 Adult failure to thrive: Secondary | ICD-10-CM | POA: Diagnosis present

## 2022-05-04 DIAGNOSIS — M5416 Radiculopathy, lumbar region: Secondary | ICD-10-CM | POA: Diagnosis not present

## 2022-05-04 DIAGNOSIS — M47816 Spondylosis without myelopathy or radiculopathy, lumbar region: Secondary | ICD-10-CM | POA: Diagnosis not present

## 2022-05-04 DIAGNOSIS — K5900 Constipation, unspecified: Secondary | ICD-10-CM | POA: Diagnosis present

## 2022-05-04 DIAGNOSIS — Z8673 Personal history of transient ischemic attack (TIA), and cerebral infarction without residual deficits: Secondary | ICD-10-CM | POA: Diagnosis not present

## 2022-05-04 DIAGNOSIS — D519 Vitamin B12 deficiency anemia, unspecified: Secondary | ICD-10-CM | POA: Diagnosis not present

## 2022-05-04 DIAGNOSIS — E441 Mild protein-calorie malnutrition: Secondary | ICD-10-CM | POA: Diagnosis not present

## 2022-05-04 DIAGNOSIS — R29898 Other symptoms and signs involving the musculoskeletal system: Secondary | ICD-10-CM | POA: Diagnosis not present

## 2022-05-04 DIAGNOSIS — I10 Essential (primary) hypertension: Secondary | ICD-10-CM | POA: Diagnosis present

## 2022-05-04 DIAGNOSIS — M549 Dorsalgia, unspecified: Secondary | ICD-10-CM | POA: Diagnosis not present

## 2022-05-04 DIAGNOSIS — F32A Depression, unspecified: Secondary | ICD-10-CM | POA: Diagnosis not present

## 2022-05-04 DIAGNOSIS — R197 Diarrhea, unspecified: Secondary | ICD-10-CM | POA: Diagnosis not present

## 2022-05-04 DIAGNOSIS — G4485 Primary stabbing headache: Secondary | ICD-10-CM | POA: Diagnosis not present

## 2022-05-04 DIAGNOSIS — R2681 Unsteadiness on feet: Secondary | ICD-10-CM | POA: Diagnosis not present

## 2022-05-04 DIAGNOSIS — R262 Difficulty in walking, not elsewhere classified: Secondary | ICD-10-CM | POA: Diagnosis not present

## 2022-05-04 DIAGNOSIS — Z794 Long term (current) use of insulin: Secondary | ICD-10-CM | POA: Diagnosis not present

## 2022-05-05 DIAGNOSIS — R2689 Other abnormalities of gait and mobility: Secondary | ICD-10-CM | POA: Diagnosis not present

## 2022-05-05 DIAGNOSIS — Z741 Need for assistance with personal care: Secondary | ICD-10-CM | POA: Diagnosis not present

## 2022-05-05 DIAGNOSIS — M6281 Muscle weakness (generalized): Secondary | ICD-10-CM | POA: Diagnosis not present

## 2022-05-05 DIAGNOSIS — I10 Essential (primary) hypertension: Secondary | ICD-10-CM | POA: Diagnosis present

## 2022-05-05 DIAGNOSIS — R531 Weakness: Secondary | ICD-10-CM | POA: Diagnosis present

## 2022-05-05 DIAGNOSIS — M549 Dorsalgia, unspecified: Secondary | ICD-10-CM | POA: Diagnosis not present

## 2022-05-05 DIAGNOSIS — E119 Type 2 diabetes mellitus without complications: Secondary | ICD-10-CM | POA: Diagnosis not present

## 2022-05-05 DIAGNOSIS — G8929 Other chronic pain: Secondary | ICD-10-CM | POA: Diagnosis present

## 2022-05-05 DIAGNOSIS — Z794 Long term (current) use of insulin: Secondary | ICD-10-CM | POA: Diagnosis not present

## 2022-05-05 DIAGNOSIS — M47816 Spondylosis without myelopathy or radiculopathy, lumbar region: Secondary | ICD-10-CM | POA: Diagnosis not present

## 2022-05-05 DIAGNOSIS — E441 Mild protein-calorie malnutrition: Secondary | ICD-10-CM | POA: Diagnosis not present

## 2022-05-05 DIAGNOSIS — R2681 Unsteadiness on feet: Secondary | ICD-10-CM | POA: Diagnosis not present

## 2022-05-05 DIAGNOSIS — E1165 Type 2 diabetes mellitus with hyperglycemia: Secondary | ICD-10-CM | POA: Diagnosis present

## 2022-05-05 DIAGNOSIS — E785 Hyperlipidemia, unspecified: Secondary | ICD-10-CM | POA: Diagnosis present

## 2022-05-05 DIAGNOSIS — G20 Parkinson's disease: Secondary | ICD-10-CM | POA: Diagnosis present

## 2022-05-05 DIAGNOSIS — R29898 Other symptoms and signs involving the musculoskeletal system: Secondary | ICD-10-CM | POA: Diagnosis not present

## 2022-05-05 DIAGNOSIS — D519 Vitamin B12 deficiency anemia, unspecified: Secondary | ICD-10-CM | POA: Diagnosis not present

## 2022-05-05 DIAGNOSIS — M5416 Radiculopathy, lumbar region: Secondary | ICD-10-CM | POA: Diagnosis not present

## 2022-05-05 DIAGNOSIS — K5900 Constipation, unspecified: Secondary | ICD-10-CM | POA: Diagnosis present

## 2022-05-05 DIAGNOSIS — F32A Depression, unspecified: Secondary | ICD-10-CM | POA: Diagnosis not present

## 2022-05-05 DIAGNOSIS — Z8673 Personal history of transient ischemic attack (TIA), and cerebral infarction without residual deficits: Secondary | ICD-10-CM | POA: Diagnosis not present

## 2022-05-05 DIAGNOSIS — R262 Difficulty in walking, not elsewhere classified: Secondary | ICD-10-CM | POA: Diagnosis not present

## 2022-05-05 DIAGNOSIS — R627 Adult failure to thrive: Secondary | ICD-10-CM | POA: Diagnosis present

## 2022-05-05 DIAGNOSIS — M48062 Spinal stenosis, lumbar region with neurogenic claudication: Secondary | ICD-10-CM | POA: Diagnosis not present

## 2022-05-06 DIAGNOSIS — R531 Weakness: Secondary | ICD-10-CM | POA: Diagnosis not present

## 2022-05-06 DIAGNOSIS — E1165 Type 2 diabetes mellitus with hyperglycemia: Secondary | ICD-10-CM | POA: Diagnosis not present

## 2022-05-06 DIAGNOSIS — I10 Essential (primary) hypertension: Secondary | ICD-10-CM | POA: Diagnosis not present

## 2022-05-06 DIAGNOSIS — G20 Parkinson's disease: Secondary | ICD-10-CM | POA: Diagnosis not present

## 2022-05-07 DIAGNOSIS — I10 Essential (primary) hypertension: Secondary | ICD-10-CM | POA: Diagnosis not present

## 2022-05-07 DIAGNOSIS — G20 Parkinson's disease: Secondary | ICD-10-CM | POA: Diagnosis not present

## 2022-05-07 DIAGNOSIS — R531 Weakness: Secondary | ICD-10-CM | POA: Diagnosis not present

## 2022-05-07 DIAGNOSIS — E1165 Type 2 diabetes mellitus with hyperglycemia: Secondary | ICD-10-CM | POA: Diagnosis not present

## 2022-05-08 DIAGNOSIS — F4323 Adjustment disorder with mixed anxiety and depressed mood: Secondary | ICD-10-CM | POA: Diagnosis not present

## 2022-05-08 DIAGNOSIS — M5416 Radiculopathy, lumbar region: Secondary | ICD-10-CM | POA: Diagnosis not present

## 2022-05-08 DIAGNOSIS — I10 Essential (primary) hypertension: Secondary | ICD-10-CM | POA: Diagnosis not present

## 2022-05-08 DIAGNOSIS — G20 Parkinson's disease: Secondary | ICD-10-CM | POA: Diagnosis not present

## 2022-05-08 DIAGNOSIS — F32A Depression, unspecified: Secondary | ICD-10-CM | POA: Diagnosis not present

## 2022-05-08 DIAGNOSIS — Z0000 Encounter for general adult medical examination without abnormal findings: Secondary | ICD-10-CM | POA: Diagnosis not present

## 2022-05-08 DIAGNOSIS — R102 Pelvic and perineal pain: Secondary | ICD-10-CM | POA: Diagnosis not present

## 2022-05-08 DIAGNOSIS — M549 Dorsalgia, unspecified: Secondary | ICD-10-CM | POA: Diagnosis not present

## 2022-05-08 DIAGNOSIS — E119 Type 2 diabetes mellitus without complications: Secondary | ICD-10-CM | POA: Diagnosis not present

## 2022-05-08 DIAGNOSIS — R262 Difficulty in walking, not elsewhere classified: Secondary | ICD-10-CM | POA: Diagnosis not present

## 2022-05-08 DIAGNOSIS — E1165 Type 2 diabetes mellitus with hyperglycemia: Secondary | ICD-10-CM | POA: Diagnosis not present

## 2022-05-08 DIAGNOSIS — R531 Weakness: Secondary | ICD-10-CM | POA: Diagnosis not present

## 2022-05-08 DIAGNOSIS — R7611 Nonspecific reaction to tuberculin skin test without active tuberculosis: Secondary | ICD-10-CM | POA: Diagnosis not present

## 2022-05-08 DIAGNOSIS — M48062 Spinal stenosis, lumbar region with neurogenic claudication: Secondary | ICD-10-CM | POA: Diagnosis not present

## 2022-05-08 DIAGNOSIS — Z741 Need for assistance with personal care: Secondary | ICD-10-CM | POA: Diagnosis not present

## 2022-05-08 DIAGNOSIS — D519 Vitamin B12 deficiency anemia, unspecified: Secondary | ICD-10-CM | POA: Diagnosis not present

## 2022-05-08 DIAGNOSIS — R2681 Unsteadiness on feet: Secondary | ICD-10-CM | POA: Diagnosis not present

## 2022-05-08 DIAGNOSIS — E785 Hyperlipidemia, unspecified: Secondary | ICD-10-CM | POA: Diagnosis not present

## 2022-05-08 DIAGNOSIS — R2689 Other abnormalities of gait and mobility: Secondary | ICD-10-CM | POA: Diagnosis not present

## 2022-05-08 DIAGNOSIS — M6281 Muscle weakness (generalized): Secondary | ICD-10-CM | POA: Diagnosis not present

## 2022-05-08 DIAGNOSIS — E441 Mild protein-calorie malnutrition: Secondary | ICD-10-CM | POA: Diagnosis not present

## 2022-05-09 DIAGNOSIS — R7611 Nonspecific reaction to tuberculin skin test without active tuberculosis: Secondary | ICD-10-CM | POA: Diagnosis not present

## 2022-05-09 DIAGNOSIS — R102 Pelvic and perineal pain: Secondary | ICD-10-CM | POA: Diagnosis not present

## 2022-05-11 DIAGNOSIS — M5416 Radiculopathy, lumbar region: Secondary | ICD-10-CM | POA: Diagnosis not present

## 2022-05-11 DIAGNOSIS — E441 Mild protein-calorie malnutrition: Secondary | ICD-10-CM | POA: Diagnosis not present

## 2022-05-11 DIAGNOSIS — E119 Type 2 diabetes mellitus without complications: Secondary | ICD-10-CM | POA: Diagnosis not present

## 2022-05-11 DIAGNOSIS — M48062 Spinal stenosis, lumbar region with neurogenic claudication: Secondary | ICD-10-CM | POA: Diagnosis not present

## 2022-05-12 DIAGNOSIS — M48062 Spinal stenosis, lumbar region with neurogenic claudication: Secondary | ICD-10-CM | POA: Diagnosis not present

## 2022-05-12 DIAGNOSIS — E119 Type 2 diabetes mellitus without complications: Secondary | ICD-10-CM | POA: Diagnosis not present

## 2022-05-12 DIAGNOSIS — M5416 Radiculopathy, lumbar region: Secondary | ICD-10-CM | POA: Diagnosis not present

## 2022-05-12 DIAGNOSIS — E441 Mild protein-calorie malnutrition: Secondary | ICD-10-CM | POA: Diagnosis not present

## 2022-05-12 DIAGNOSIS — F4323 Adjustment disorder with mixed anxiety and depressed mood: Secondary | ICD-10-CM | POA: Diagnosis not present

## 2022-05-14 DIAGNOSIS — E119 Type 2 diabetes mellitus without complications: Secondary | ICD-10-CM | POA: Diagnosis not present

## 2022-05-14 DIAGNOSIS — E441 Mild protein-calorie malnutrition: Secondary | ICD-10-CM | POA: Diagnosis not present

## 2022-05-14 DIAGNOSIS — M48062 Spinal stenosis, lumbar region with neurogenic claudication: Secondary | ICD-10-CM | POA: Diagnosis not present

## 2022-05-14 DIAGNOSIS — M5416 Radiculopathy, lumbar region: Secondary | ICD-10-CM | POA: Diagnosis not present

## 2022-05-15 DIAGNOSIS — E119 Type 2 diabetes mellitus without complications: Secondary | ICD-10-CM | POA: Diagnosis not present

## 2022-05-15 DIAGNOSIS — M48062 Spinal stenosis, lumbar region with neurogenic claudication: Secondary | ICD-10-CM | POA: Diagnosis not present

## 2022-05-15 DIAGNOSIS — E441 Mild protein-calorie malnutrition: Secondary | ICD-10-CM | POA: Diagnosis not present

## 2022-05-15 DIAGNOSIS — M5416 Radiculopathy, lumbar region: Secondary | ICD-10-CM | POA: Diagnosis not present

## 2022-05-16 DIAGNOSIS — M5416 Radiculopathy, lumbar region: Secondary | ICD-10-CM | POA: Diagnosis not present

## 2022-05-16 DIAGNOSIS — M48062 Spinal stenosis, lumbar region with neurogenic claudication: Secondary | ICD-10-CM | POA: Diagnosis not present

## 2022-05-16 DIAGNOSIS — E119 Type 2 diabetes mellitus without complications: Secondary | ICD-10-CM | POA: Diagnosis not present

## 2022-05-16 DIAGNOSIS — E441 Mild protein-calorie malnutrition: Secondary | ICD-10-CM | POA: Diagnosis not present

## 2022-05-18 DIAGNOSIS — E441 Mild protein-calorie malnutrition: Secondary | ICD-10-CM | POA: Diagnosis not present

## 2022-05-18 DIAGNOSIS — E119 Type 2 diabetes mellitus without complications: Secondary | ICD-10-CM | POA: Diagnosis not present

## 2022-05-18 DIAGNOSIS — M48062 Spinal stenosis, lumbar region with neurogenic claudication: Secondary | ICD-10-CM | POA: Diagnosis not present

## 2022-05-18 DIAGNOSIS — M5416 Radiculopathy, lumbar region: Secondary | ICD-10-CM | POA: Diagnosis not present

## 2022-05-19 DIAGNOSIS — E441 Mild protein-calorie malnutrition: Secondary | ICD-10-CM | POA: Diagnosis not present

## 2022-05-19 DIAGNOSIS — M5416 Radiculopathy, lumbar region: Secondary | ICD-10-CM | POA: Diagnosis not present

## 2022-05-19 DIAGNOSIS — E119 Type 2 diabetes mellitus without complications: Secondary | ICD-10-CM | POA: Diagnosis not present

## 2022-05-19 DIAGNOSIS — M48062 Spinal stenosis, lumbar region with neurogenic claudication: Secondary | ICD-10-CM | POA: Diagnosis not present

## 2022-05-21 DIAGNOSIS — M5416 Radiculopathy, lumbar region: Secondary | ICD-10-CM | POA: Diagnosis not present

## 2022-05-21 DIAGNOSIS — E441 Mild protein-calorie malnutrition: Secondary | ICD-10-CM | POA: Diagnosis not present

## 2022-05-21 DIAGNOSIS — M48062 Spinal stenosis, lumbar region with neurogenic claudication: Secondary | ICD-10-CM | POA: Diagnosis not present

## 2022-05-21 DIAGNOSIS — E119 Type 2 diabetes mellitus without complications: Secondary | ICD-10-CM | POA: Diagnosis not present

## 2022-05-23 ENCOUNTER — Telehealth: Payer: Self-pay | Admitting: Internal Medicine

## 2022-05-23 DIAGNOSIS — M48062 Spinal stenosis, lumbar region with neurogenic claudication: Secondary | ICD-10-CM | POA: Diagnosis not present

## 2022-05-23 DIAGNOSIS — M5416 Radiculopathy, lumbar region: Secondary | ICD-10-CM | POA: Diagnosis not present

## 2022-05-23 DIAGNOSIS — E119 Type 2 diabetes mellitus without complications: Secondary | ICD-10-CM | POA: Diagnosis not present

## 2022-05-23 DIAGNOSIS — E441 Mild protein-calorie malnutrition: Secondary | ICD-10-CM | POA: Diagnosis not present

## 2022-05-23 NOTE — Telephone Encounter (Signed)
Left message for patient to call back to schedule Medicare Annual Wellness Visit   Last AWV  12/08/14  Please schedule at anytime with [facility] Advisor if patient calls the office back.     Any questions, please call me at [phone]

## 2022-05-24 ENCOUNTER — Ambulatory Visit (INDEPENDENT_AMBULATORY_CARE_PROVIDER_SITE_OTHER): Payer: Medicare Other

## 2022-05-24 DIAGNOSIS — Z0000 Encounter for general adult medical examination without abnormal findings: Secondary | ICD-10-CM | POA: Diagnosis not present

## 2022-05-24 NOTE — Progress Notes (Cosign Needed Addendum)
I connected with [name] today by telephone and verified that I am speaking with the correct person using two identifiers. Location patient: home Location provider: work Persons participating in the virtual visit: patient, provider.   I discussed the limitations, risks, security and privacy concerns of performing an evaluation and management service by telephone and the availability of in person appointments. I also discussed with the patient that there may be a patient responsible charge related to this service. The patient expressed understanding and verbally consented to this telephonic visit.    Interactive audio and video telecommunications were attempted between this provider and patient, however failed, due to patient having technical difficulties OR patient did not have access to video capability.  We continued and completed visit with audio only.  Some vital signs may be absent or patient reported.   Time Spent with patient on telephone encounter: 30 minutes  Subjective:   [name] is a 71 y.o. female who presents for Medicare Annual (Subsequent) preventive examination.  Review of Systems     Cardiac Risk Factors include: advanced age (>9mn, >>50women);diabetes mellitus;dyslipidemia;family history of premature cardiovascular disease;sedentary lifestyle     Objective:    Today's Vitals   05/24/22 1434  PainSc: 5    There is no height or weight on file to calculate BMI.     05/24/2022    2:37 PM 01/19/2022   12:53 PM 07/18/2019    8:00 AM 07/16/2019    1:42 AM 04/18/2018   12:43 PM 10/04/2017    5:25 PM 10/04/2017    5:24 PM  Advanced Directives  Does Patient Have a Medical Advance Directive? Yes No No No Yes  Yes  Type of ATheatre managerof AFrontier Oil CorporationPower of Attorney  Does patient want to make changes to medical advance directive? No - Patient declined    No - Patient declined Yes (Inpatient - patient  requests chaplain consult to change a medical advance directive) No - Patient declined  Copy of [place] Chart? No - copy requested    No - copy requested  No - copy requested  Would patient like information on creating a medical advance directive?  No - Patient declined  No - Patient declined       Current Medications (verified) Outpatient Encounter Medications as of 05/24/2022  Medication Sig   albuterol (VENTOLIN HFA) 108 (90 Base) MCG/ACT inhaler Inhale 1-2 puffs into the lungs every 4 (four) hours as needed for wheezing or shortness of breath.   aspirin 81 MG EC tablet Take 81 mg by mouth daily.   atorvastatin (LIPITOR) 40 MG tablet Take 1 tablet (40 mg total) by mouth daily. Annual appt due in Aug must see provider for future refills   B Complex-Folic Acid (B COMPLEX PLUS) TABS Take 1 tablet by mouth daily.   Blood Glucose Monitoring Suppl (ONETOUCH VERIO) w/Device KIT 1 Units by Does not apply route daily as needed.   carbidopa-levodopa (SINEMET IR) 25-250 MG tablet Take 1 tablet by mouth 3 (three) times daily. Annual appt due in Aug must see provider for future refills   Cholecalciferol (VITAMIN D3) 50 MCG (2000 UT) capsule Take 1 capsule (2,000 Units total) by mouth daily.   dapagliflozin propanediol (FARXIGA) 5 MG TABS tablet Take 1 tablet (5 mg total) by mouth daily before breakfast. (Patient not taking: Reported on 01/19/2022)   diclofenac Sodium (VOLTAREN) 1 % GEL Apply  1 application topically 4 (four) times daily. (Patient not taking: Reported on 01/19/2022)   glucose blood (ONETOUCH VERIO) test strip Use to check blood sugars twice a day. Dx E11.9   HYDROmorphone (DILAUDID) 2 MG tablet Take 1 tablet (2 mg total) by mouth daily.   LORazepam (ATIVAN) 2 MG tablet Take 1 tablet (2 mg total) by mouth every 8 (eight) hours as needed. for anxiety   metFORMIN (GLUCOPHAGE) 500 MG tablet TAKE ONE TABLET BY MOUTH TWICE DAILY WITH A MEAL   metoprolol tartrate (LOPRESSOR)  25 MG tablet TAKE 1/2 TABLET TWICE DAILY.   mirtazapine (REMERON) 15 MG tablet TAKE 1 TABLET BY MOUTH AT BEDTIME 6-8PM   multivitamin-lutein (OCUVITE-LUTEIN) CAPS capsule Take 1 capsule by mouth daily.   OneTouch Delica Lancets 16X MISC Use to help check blood sugars once a day. Dx E11.9   promethazine-dextromethorphan (PROMETHAZINE-DM) 6.25-15 MG/5ML syrup Take 5 mLs by mouth 4 (four) times daily as needed for cough.   repaglinide (PRANDIN) 2 MG tablet TAKE TWO TABLETS BY MOUTH THREE TIMES DAILY BEFORE MEALS   tamsulosin (FLOMAX) 0.4 MG CAPS capsule TAKE (1) CAPSULE DAILY. (Patient taking differently: 0.4 mg at bedtime.)   vitamin B-12 (CYANOCOBALAMIN) 1000 MCG tablet Take 1 tablet (1,000 mcg total) by mouth daily.   No facility-administered encounter medications on file as of 05/24/2022.    Allergies (verified) Asa [aspirin] and Prednisone   History: Past Medical History:  Diagnosis Date   Anoxic brain injury ([place])    Anxiety    Aortic atherosclerosis ([place]) 10/04/2017   Cellulitis and abscess of other specified site 10/11/2010   Qualifier: Diagnosis of  By: Plotnikov MD, Evie Lacks    Chronic neck pain    Chronic pain in shoulder    COPD (chronic obstructive pulmonary disease) ([place])    Depression    Fracture of left humerus 06/24/2014   Gait disorder    GERD (gastroesophageal reflux disease)    Hernia of abdominal cavity    History of unilateral cerebral infarction in a watershed distribution    Hyperlipidemia    LBP (low back pain)    Neuropathy    Osteoarthritis    Seizure disorder ([place])    Seizures ([place])    Type II or unspecified type diabetes mellitus without mention of complication, not stated as uncontrolled 2010   UTI (urinary tract infection) 10/2017   Vitamin B 12 deficiency 2011   Past Surgical History:  Procedure Laterality Date   COLONOSCOPY     DIAGNOSTIC LAPAROSCOPY     PMH: Exploratory lap   HERNIA REPAIR     incisional   ORIF HUMERUS FRACTURE Left  06/24/2014   Procedure: LEFT OPEN REDUCTION INTERNAL FIXATION (ORIF) PROXIMAL HUMERUS FRACTURE;  Surgeon: Johnny Bridge, MD;  Location: [facility];  Service: Orthopedics;  Laterality: Left;   PANCREATECTOMY     TONSILLECTOMY     TUBAL LIGATION     Family History  Problem Relation Age of Onset   Stroke Mother    Heart disease Father 38       MI   Hypertension Other    Social History   Socioeconomic History   Marital status: Married    Spouse name: Not on file   Number of children: Not on file   Years of education: Not on file   Highest education level: Not on file  Occupational History   Occupation: disabled  Tobacco Use   Smoking status: Former    Packs/day: 1.00  Years: 20.00    Total pack years: 20.00    Types: Cigarettes    Quit date: 07/18/2016    Years since quitting: 5.8   Smokeless tobacco: Never  Vaping Use   Vaping Use: Never used  Substance and Sexual Activity   Alcohol use: Yes    Alcohol/week: 10.0 standard drinks of alcohol    Types: 10 Glasses of wine per week    Comment: 1 glass of wine daily   Drug use: No   Sexual activity: Yes  Other Topics Concern   Not on file  Social History Narrative   Lives with husband in a one story home.  Has a son and a daughter but the daughter passed away at the age of 9.  On disability.  College degree from [employer].  She was a Music therapist.   Social Determinants of Health   Financial Resource Strain: Low Risk  (05/24/2022)   Overall Financial Resource Strain (CARDIA)    Difficulty of Paying Living Expenses: Not hard at all  Food Insecurity: No Food Insecurity (05/24/2022)   Hunger Vital Sign    Worried About Running Out of Food in the Last Year: Never true    Ran Out of Food in the Last Year: Never true  Transportation Needs: No Transportation Needs (05/24/2022)   PRAPARE - Hydrologist (Medical): No    Lack of Transportation (Non-Medical): No  Physical Activity: Sufficiently Active  (05/24/2022)   Exercise Vital Sign    Days of Exercise per Week: 5 days    Minutes of Exercise per Session: 30 min  Stress: No Stress Concern Present (05/24/2022)   [place]    Feeling of Stress : Not at all  Social Connections: Unknown (05/24/2022)   Social Connection and Isolation Panel [NHANES]    Frequency of Communication with Friends and Family: Patient refused    Frequency of Social Gatherings with Friends and Family: Patient refused    Attends Religious Services: Patient refused    Marine scientist or Organizations: Patient refused    Attends Music therapist: Patient refused    Marital Status: Patient refused    Tobacco Counseling Counseling given: Not Answered   Clinical Intake:  Pre-visit preparation completed: Yes  Pain : 0-10 Pain Score: 5  Pain Location: Back Pain Orientation: Lower Pain Descriptors / Indicators: Aching Pain Onset: More than a month ago Pain Frequency: Intermittent Pain Relieving Factors: Pain Medication Effect of Pain on Daily Activities: Pain can diminish job performance, lower motivation to exercise and prevent you from completing daily tasks.  Pain produces disability and affects the quality of life.  Pain Relieving Factors: Pain Medication  Nutritional Risks: None Diabetes: Yes CBG done?: No Did pt. bring in CBG monitor from home?: No  How often do you need to have someone help you when you read instructions, pamphlets, or other written materials from your doctor or pharmacy?: 1 - Never What is the last grade level you completed in school?: Bachelor's Degree  Diabetic? yes  Interpreter Needed?: No  Information entered by :: Lisette Abu, LPN.   Activities of Daily Living    05/24/2022    2:49 PM  In your present state of health, do you have any difficulty performing the following activities:  Hearing? 0  Vision? 0  Difficulty  concentrating or making decisions? 0  Walking or climbing stairs? 1  Comment use a walker  Dressing  or bathing? 0  Doing errands, shopping? 0  Preparing Food and eating ? N  Using the Toilet? N  In the past six months, have you accidently leaked urine? N  Do you have problems with loss of bowel control? N  Managing your Medications? N  Managing your Finances? N  Housekeeping or managing your Housekeeping? N    Patient Care Team: Plotnikov, Evie Lacks, MD as PCP - General Love, Alyson Locket, MD as Consulting Physician (Neurology) Nicholaus Bloom, MD as Consulting Physician (Anesthesiology)  Indicate any recent Medical Services you may have received from other than [facility] providers in the past year (date may be approximate).     Assessment:   This is a routine wellness examination for [name].  Hearing/Vision screen Hearing Screening - Comments:: Patient denied any hearing difficulty.   No hearing aids.   Vision Screening - Comments:: Patient does wear readers.  [facility] exam done by: [facility]   Dietary issues and exercise activities discussed: Current Exercise Habits: Structured exercise class, Type of exercise: Other - see comments (PT and OT exercises; history of stroke), Time (Minutes): 30, Frequency (Times/Week): 5, Weekly Exercise (Minutes/Week): 150, Intensity: Mild, Exercise limited by: respiratory conditions(s);neurologic condition(s)   Goals Addressed   None   Depression Screen    05/24/2022    2:47 PM 10/31/2021    2:03 PM 07/31/2021    2:11 PM 04/27/2021    1:45 PM 11/14/2020   10:14 AM 06/28/2020    4:02 PM 12/16/2017    1:26 PM  PHQ 2/9 Scores  PHQ - 2 Score 1 0 0 2 0 0 0  PHQ- 9 Score  0 0 6 0 0     Fall Risk    05/24/2022    2:49 PM 07/31/2021    2:10 PM 06/28/2020    3:59 PM 04/30/2019    4:11 PM 12/16/2017    1:26 PM  [place] in the past year? 0 1 0 1 Yes  Comment    Emmi Telephone Survey: data to providers prior to load    Number falls in past yr: 0 1 0 1 2 or more  Comment    Emmi Telephone Survey Actual Response = 2   Injury with Fall? 0 1 0 0 Yes  Risk for fall due to : No Fall Risks History of fall(s);Impaired balance/gait     Risk for fall due to: Comment  uses walker w/ husband assisitant     Follow up Falls evaluation completed        [place]:  Any stairs in or around the home? No  If so, are there any without handrails? No  Home free of loose throw rugs in walkways, pet beds, electrical cords, etc? Yes  Adequate lighting in your home to reduce risk of falls? Yes   ASSISTIVE DEVICES UTILIZED TO PREVENT FALLS:  Life alert? No  Use of a cane, walker or w/c? Yes  Grab bars in the bathroom? No  Shower chair or bench in shower? No  Elevated toilet seat or a handicapped toilet? No   TIMED UP AND GO:  Was the test performed? No .  Length of time to ambulate 10 feet: n/a sec.   Appearance of gait: Gait not evaluated during this visit.  Cognitive Function:        05/24/2022    2:51 PM  6CIT Screen  What Year? 0 points  What  month? 0 points  What time? 0 points  Count back from 20 0 points  Months in reverse 0 points  Repeat phrase 0 points  Total Score 0 points    Immunizations Immunization History  Administered Date(s) Administered   Fluad Quad(high Dose 65+) 08/06/2019, 06/28/2020, 07/31/2021   Influenza Whole 09/19/2007, 06/25/2008, 06/08/2009, 06/30/2010, 06/02/2011, 08/01/2012   Influenza, High Dose Seasonal PF 07/02/2016, 06/23/2018   Influenza,inj,Quad PF,6+ Mos 07/13/2013   Influenza-Unspecified 07/02/2015   Moderna SARS-COV2 Booster Vaccination 07/26/2020   PFIZER(Purple Top)SARS-COV-2 Vaccination 12/04/2019, 12/25/2019   Pneumococcal Conjugate-13 12/08/2014   Pneumococcal Polysaccharide-23 10/26/2013   Zoster Recombinat (Shingrix) 10/11/2020, 03/29/2021    TDAP status: Up to date  Flu Vaccine status: Up to date  Pneumococcal  vaccine status: Up to date  Covid-19 vaccine status: Completed vaccines  Qualifies for Shingles Vaccine? Yes   Zostavax completed No   Shingrix Completed?: Yes  Screening Tests Health Maintenance  Topic Date Due   Diabetic kidney evaluation - Urine ACR  Never done   Hepatitis C Screening  Never done   MAMMOGRAM  04/22/2017   FOOT EXAM  07/02/2017   Pneumonia Vaccine 39+ Years old (3 - PPSV23 or PCV20) 10/26/2018   OPHTHALMOLOGY EXAM  01/16/2019   COVID-19 Vaccine (3 - Pfizer risk series) 08/23/2020   HEMOGLOBIN A1C  02/01/2022   INFLUENZA VACCINE  05/01/2022   COLONOSCOPY (Pts 45-97yr Insurance coverage will need to be confirmed)  10/01/2022   Diabetic kidney evaluation - GFR measurement  01/20/2023   TETANUS/TDAP  11/01/2024   DEXA SCAN  Completed   Zoster Vaccines- Shingrix  Completed   HPV VACCINES  Aged Out    Health Maintenance  Health Maintenance Due  Topic Date Due   Diabetic kidney evaluation - Urine ACR  Never done   Hepatitis C Screening  Never done   MAMMOGRAM  04/22/2017   FOOT EXAM  07/02/2017   Pneumonia Vaccine 72 Years old (3 - PPSV23 or PCV20) 10/26/2018   OPHTHALMOLOGY EXAM  01/16/2019   COVID-19 Vaccine (3 - Pfizer risk series) 08/23/2020   HEMOGLOBIN A1C  02/01/2022   INFLUENZA VACCINE  05/01/2022    Colorectal cancer screening: Type of screening: Colonoscopy. Completed 10/01/2012. Repeat every 10 years   Mammogram status: Completed 04/23/2015. [place]  Bone Density status: Completed 07/02/2016. Results reflect: Bone density results: OSTEOPOROSIS. Repeat every 2 years.  Lung Cancer Screening: (Low Dose CT Chest recommended if Age 72-80years, 30 pack-year currently smoking OR have quit w/in 15years.) does not qualify.   Lung Cancer Screening Referral: no  Additional Screening:  Hepatitis C Screening: does qualify; Completed no  Vision Screening: Recommended annual ophthalmology exams for early detection of  glaucoma and other disorders of the eye. Is the patient up to date with their annual eye exam?  No  Who is the provider or what is the name of the office in which the patient attends annual eye exams? [place] not established with a provider, would they like to be referred to a provider to establish care? No .   Dental Screening: Recommended annual dental exams for proper oral hygiene  Community Resource Referral / Chronic Care Management: CRR required this visit?  No   CCM required this visit?  No      Plan:     I have personally reviewed and noted the following in the patient's chart:   Medical and social history Use  of alcohol, tobacco or illicit drugs  Current medications and supplements including opioid prescriptions. Patient is currently taking opioid prescriptions. Information provided to patient regarding non-opioid alternatives. Patient advised to discuss non-opioid treatment plan with their provider. Functional ability and status Nutritional status Physical activity Advanced directives List of other physicians Hospitalizations, surgeries, and ER visits in previous 12 months Vitals Screenings to include cognitive, depression, and falls Referrals and appointments  In addition, I have reviewed and discussed with patient certain preventive protocols, quality metrics, and best practice recommendations. A written personalized care plan for preventive services as well as general preventive health recommendations were provided to patient.      Sheral Flow, LPN   [id number]/53/2992   Nurse Notes:  There were no vitals filed for this visit. There is no height or weight on file to calculate BMI. Patient stated that she does have issues with gait and balance; does use a wheelchair.    Medical screening examination/treatment/procedure(s) were performed by non-physician practitioner and as supervising physician I was immediately available for  consultation/collaboration.  I agree with above. Lew Dawes, MD

## 2022-05-24 NOTE — Patient Instructions (Signed)
Ms. Locust , Thank you for taking time to come for your Medicare Wellness Visit. I appreciate your ongoing commitment to your health goals. Please review the following plan we discussed and let me know if I can assist you in the future.   Screening recommendations/referrals: Colonoscopy: 10/01/2012; due every 10 years Mammogram: 04/23/2015; overdue Bone Density: 07/02/2016; overdue Recommended yearly ophthalmology/optometry visit for glaucoma screening and checkup Recommended yearly dental visit for hygiene and checkup  Vaccinations: Influenza vaccine: 07/31/2021 Pneumococcal vaccine: 10/26/2013, 12/08/2014 Tdap vaccine: 11/01/2014; due every 10 years Shingles vaccine: 10/11/2020, 03/29/2021   Covid-19: 12/04/2019, 12/25/2019, 07/26/2020  Advanced directives: Yes  Conditions/risks identified: Yes  Next appointment: Please schedule your next Medicare Wellness Visit with your Nurse Health Advisor in 1 year by calling [phone].   Preventive Care 55 Years and Older, Female Preventive care refers to lifestyle choices and visits with your health care provider that can promote health and wellness. What does preventive care include? A yearly physical exam. This is also called an annual well check. Dental exams once or twice a year. Routine eye exams. Ask your health care provider how often you should have your eyes checked. Personal lifestyle choices, including: Daily care of your teeth and gums. Regular physical activity. Eating a healthy diet. Avoiding tobacco and drug use. Limiting alcohol use. Practicing safe sex. Taking low-dose aspirin every day. Taking vitamin and mineral supplements as recommended by your health care provider. What happens during an annual well check? The services and screenings done by your health care provider during your annual well check will depend on your age, overall health, lifestyle risk factors, and family history of disease. Counseling  Your health care  provider may ask you questions about your: Alcohol use. Tobacco use. Drug use. Emotional well-being. Home and relationship well-being. Sexual activity. Eating habits. History of falls. Memory and ability to understand (cognition). Work and work Astronomer. Reproductive health. Screening  You may have the following tests or measurements: Height, weight, and BMI. Blood pressure. Lipid and cholesterol levels. These may be checked every 5 years, or more frequently if you are over 40 years old. Skin check. Lung cancer screening. You may have this screening every year starting at age 18 if you have a 30-pack-year history of smoking and currently smoke or have quit within the past 15 years. Fecal occult blood test (FOBT) of the stool. You may have this test every year starting at age 87. Flexible sigmoidoscopy or colonoscopy. You may have a sigmoidoscopy every 5 years or a colonoscopy every 10 years starting at age 23. Hepatitis C blood test. Hepatitis B blood test. Sexually transmitted disease (STD) testing. Diabetes screening. This is done by checking your blood sugar (glucose) after you have not eaten for a while (fasting). You may have this done every 1-3 years. Bone density scan. This is done to screen for osteoporosis. You may have this done starting at age 59. Mammogram. This may be done every 1-2 years. Talk to your health care provider about how often you should have regular mammograms. Talk with your health care provider about your test results, treatment options, and if necessary, the need for more tests. Vaccines  Your health care provider may recommend certain vaccines, such as: Influenza vaccine. This is recommended every year. Tetanus, diphtheria, and acellular pertussis (Tdap, Td) vaccine. You may need a Td booster every 10 years. Zoster vaccine. You may need this after age 42. Pneumococcal 13-valent conjugate (PCV13) vaccine. One dose is recommended after age  108.  Pneumococcal polysaccharide (PPSV23) vaccine. One dose is recommended after age 64. Talk to your health care provider about which screenings and vaccines you need and how often you need them. This information is not intended to replace advice given to you by your health care provider. Make sure you discuss any questions you have with your health care provider. Document Released: 10/14/2015 Document Revised: 06/06/2016 Document Reviewed: 07/19/2015 Elsevier Interactive Patient Education  2017 [employer] Prevention in the Home Falls can cause injuries. They can happen to people of all ages. There are many things you can do to make your home safe and to help prevent falls. What can I do on the outside of my home? Regularly fix the edges of walkways and driveways and fix any cracks. Remove anything that might make you trip as you walk through a door, such as a raised step or threshold. Trim any bushes or trees on the path to your home. Use bright outdoor lighting. Clear any walking paths of anything that might make someone trip, such as rocks or tools. Regularly check to see if handrails are loose or broken. Make sure that both sides of any steps have handrails. Any raised decks and porches should have guardrails on the edges. Have any leaves, snow, or ice cleared regularly. Use sand or salt on walking paths during winter. Clean up any spills in your garage right away. This includes oil or grease spills. What can I do in the bathroom? Use night lights. Install grab bars by the toilet and in the tub and shower. Do not use towel bars as grab bars. Use non-skid mats or decals in the tub or shower. If you need to sit down in the shower, use a plastic, non-slip stool. Keep the floor dry. Clean up any water that spills on the floor as soon as it happens. Remove soap buildup in the tub or shower regularly. Attach bath mats securely with double-sided non-slip rug tape. Do not have throw  rugs and other things on the floor that can make you trip. What can I do in the bedroom? Use night lights. Make sure that you have a light by your bed that is easy to reach. Do not use any sheets or blankets that are too big for your bed. They should not hang down onto the floor. Have a firm chair that has side arms. You can use this for support while you get dressed. Do not have throw rugs and other things on the floor that can make you trip. What can I do in the kitchen? Clean up any spills right away. Avoid walking on wet floors. Keep items that you use a lot in easy-to-reach places. If you need to reach something above you, use a strong step stool that has a grab bar. Keep electrical cords out of the way. Do not use floor polish or wax that makes floors slippery. If you must use wax, use non-skid floor wax. Do not have throw rugs and other things on the floor that can make you trip. What can I do with my stairs? Do not leave any items on the stairs. Make sure that there are handrails on both sides of the stairs and use them. Fix handrails that are broken or loose. Make sure that handrails are as long as the stairways. Check any carpeting to make sure that it is firmly attached to the stairs. Fix any carpet that is loose or worn. Avoid having throw rugs at the top  or bottom of the stairs. If you do have throw rugs, attach them to the floor with carpet tape. Make sure that you have a light switch at the top of the stairs and the bottom of the stairs. If you do not have them, ask someone to add them for you. What else can I do to help prevent falls? Wear shoes that: Do not have high heels. Have rubber bottoms. Are comfortable and fit you well. Are closed at the toe. Do not wear sandals. If you use a stepladder: Make sure that it is fully opened. Do not climb a closed stepladder. Make sure that both sides of the stepladder are locked into place. Ask someone to hold it for you, if  possible. Clearly mark and make sure that you can see: Any grab bars or handrails. First and last steps. Where the edge of each step is. Use tools that help you move around (mobility aids) if they are needed. These include: Canes. Walkers. Scooters. Crutches. Turn on the lights when you go into a dark area. Replace any light bulbs as soon as they burn out. Set up your furniture so you have a clear path. Avoid moving your furniture around. If any of your floors are uneven, fix them. If there are any pets around you, be aware of where they are. Review your medicines with your doctor. Some medicines can make you feel dizzy. This can increase your chance of falling. Ask your doctor what other things that you can do to help prevent falls. This information is not intended to replace advice given to you by your health care provider. Make sure you discuss any questions you have with your health care provider. Document Released: 07/14/2009 Document Revised: 02/23/2016 Document Reviewed: 10/22/2014 Elsevier Interactive Patient Education  2017 [employer].

## 2022-05-25 DIAGNOSIS — E441 Mild protein-calorie malnutrition: Secondary | ICD-10-CM | POA: Diagnosis not present

## 2022-05-25 DIAGNOSIS — M5416 Radiculopathy, lumbar region: Secondary | ICD-10-CM | POA: Diagnosis not present

## 2022-05-25 DIAGNOSIS — E119 Type 2 diabetes mellitus without complications: Secondary | ICD-10-CM | POA: Diagnosis not present

## 2022-05-25 DIAGNOSIS — M48062 Spinal stenosis, lumbar region with neurogenic claudication: Secondary | ICD-10-CM | POA: Diagnosis not present

## 2022-05-26 DIAGNOSIS — M48062 Spinal stenosis, lumbar region with neurogenic claudication: Secondary | ICD-10-CM | POA: Diagnosis not present

## 2022-05-26 DIAGNOSIS — M5416 Radiculopathy, lumbar region: Secondary | ICD-10-CM | POA: Diagnosis not present

## 2022-05-26 DIAGNOSIS — E119 Type 2 diabetes mellitus without complications: Secondary | ICD-10-CM | POA: Diagnosis not present

## 2022-05-26 DIAGNOSIS — E441 Mild protein-calorie malnutrition: Secondary | ICD-10-CM | POA: Diagnosis not present

## 2022-05-28 DIAGNOSIS — M48062 Spinal stenosis, lumbar region with neurogenic claudication: Secondary | ICD-10-CM | POA: Diagnosis not present

## 2022-05-28 DIAGNOSIS — E119 Type 2 diabetes mellitus without complications: Secondary | ICD-10-CM | POA: Diagnosis not present

## 2022-05-28 DIAGNOSIS — M5416 Radiculopathy, lumbar region: Secondary | ICD-10-CM | POA: Diagnosis not present

## 2022-05-28 DIAGNOSIS — E441 Mild protein-calorie malnutrition: Secondary | ICD-10-CM | POA: Diagnosis not present

## 2022-05-30 DIAGNOSIS — E119 Type 2 diabetes mellitus without complications: Secondary | ICD-10-CM | POA: Diagnosis not present

## 2022-05-30 DIAGNOSIS — M5416 Radiculopathy, lumbar region: Secondary | ICD-10-CM | POA: Diagnosis not present

## 2022-05-30 DIAGNOSIS — M48062 Spinal stenosis, lumbar region with neurogenic claudication: Secondary | ICD-10-CM | POA: Diagnosis not present

## 2022-05-30 DIAGNOSIS — E441 Mild protein-calorie malnutrition: Secondary | ICD-10-CM | POA: Diagnosis not present

## 2022-06-01 DIAGNOSIS — E441 Mild protein-calorie malnutrition: Secondary | ICD-10-CM | POA: Diagnosis not present

## 2022-06-01 DIAGNOSIS — E119 Type 2 diabetes mellitus without complications: Secondary | ICD-10-CM | POA: Diagnosis not present

## 2022-06-01 DIAGNOSIS — M48062 Spinal stenosis, lumbar region with neurogenic claudication: Secondary | ICD-10-CM | POA: Diagnosis not present

## 2022-06-01 DIAGNOSIS — M5416 Radiculopathy, lumbar region: Secondary | ICD-10-CM | POA: Diagnosis not present

## 2022-06-06 DIAGNOSIS — E441 Mild protein-calorie malnutrition: Secondary | ICD-10-CM | POA: Diagnosis not present

## 2022-06-06 DIAGNOSIS — E119 Type 2 diabetes mellitus without complications: Secondary | ICD-10-CM | POA: Diagnosis not present

## 2022-06-06 DIAGNOSIS — M48062 Spinal stenosis, lumbar region with neurogenic claudication: Secondary | ICD-10-CM | POA: Diagnosis not present

## 2022-06-06 DIAGNOSIS — M5416 Radiculopathy, lumbar region: Secondary | ICD-10-CM | POA: Diagnosis not present

## 2022-06-08 DIAGNOSIS — M48062 Spinal stenosis, lumbar region with neurogenic claudication: Secondary | ICD-10-CM | POA: Diagnosis not present

## 2022-06-08 DIAGNOSIS — E119 Type 2 diabetes mellitus without complications: Secondary | ICD-10-CM | POA: Diagnosis not present

## 2022-06-08 DIAGNOSIS — M5416 Radiculopathy, lumbar region: Secondary | ICD-10-CM | POA: Diagnosis not present

## 2022-06-08 DIAGNOSIS — E441 Mild protein-calorie malnutrition: Secondary | ICD-10-CM | POA: Diagnosis not present

## 2022-06-11 DIAGNOSIS — M5416 Radiculopathy, lumbar region: Secondary | ICD-10-CM | POA: Diagnosis not present

## 2022-06-11 DIAGNOSIS — M48062 Spinal stenosis, lumbar region with neurogenic claudication: Secondary | ICD-10-CM | POA: Diagnosis not present

## 2022-06-11 DIAGNOSIS — E441 Mild protein-calorie malnutrition: Secondary | ICD-10-CM | POA: Diagnosis not present

## 2022-06-11 DIAGNOSIS — E119 Type 2 diabetes mellitus without complications: Secondary | ICD-10-CM | POA: Diagnosis not present

## 2022-06-13 DIAGNOSIS — E119 Type 2 diabetes mellitus without complications: Secondary | ICD-10-CM | POA: Diagnosis not present

## 2022-06-13 DIAGNOSIS — M48062 Spinal stenosis, lumbar region with neurogenic claudication: Secondary | ICD-10-CM | POA: Diagnosis not present

## 2022-06-13 DIAGNOSIS — E441 Mild protein-calorie malnutrition: Secondary | ICD-10-CM | POA: Diagnosis not present

## 2022-06-13 DIAGNOSIS — M5416 Radiculopathy, lumbar region: Secondary | ICD-10-CM | POA: Diagnosis not present

## 2022-06-14 DIAGNOSIS — M48062 Spinal stenosis, lumbar region with neurogenic claudication: Secondary | ICD-10-CM | POA: Diagnosis not present

## 2022-06-14 DIAGNOSIS — E441 Mild protein-calorie malnutrition: Secondary | ICD-10-CM | POA: Diagnosis not present

## 2022-06-14 DIAGNOSIS — E119 Type 2 diabetes mellitus without complications: Secondary | ICD-10-CM | POA: Diagnosis not present

## 2022-06-14 DIAGNOSIS — M5416 Radiculopathy, lumbar region: Secondary | ICD-10-CM | POA: Diagnosis not present

## 2022-06-28 ENCOUNTER — Encounter: Payer: Self-pay | Admitting: Internal Medicine

## 2022-06-28 ENCOUNTER — Ambulatory Visit (INDEPENDENT_AMBULATORY_CARE_PROVIDER_SITE_OTHER): Payer: Medicare Other | Admitting: Internal Medicine

## 2022-06-28 VITALS — BP 120/62 | HR 108 | Temp 98.4°F | Ht 65.0 in | Wt 122.2 lb

## 2022-06-28 DIAGNOSIS — E1100 Type 2 diabetes mellitus with hyperosmolarity without nonketotic hyperglycemic-hyperosmolar coma (NKHHC): Secondary | ICD-10-CM

## 2022-06-28 DIAGNOSIS — Z23 Encounter for immunization: Secondary | ICD-10-CM | POA: Diagnosis not present

## 2022-06-28 DIAGNOSIS — E538 Deficiency of other specified B group vitamins: Secondary | ICD-10-CM

## 2022-06-28 DIAGNOSIS — F32A Depression, unspecified: Secondary | ICD-10-CM

## 2022-06-28 DIAGNOSIS — F418 Other specified anxiety disorders: Secondary | ICD-10-CM | POA: Diagnosis not present

## 2022-06-28 DIAGNOSIS — R269 Unspecified abnormalities of gait and mobility: Secondary | ICD-10-CM | POA: Diagnosis not present

## 2022-06-28 MED ORDER — TAMSULOSIN HCL 0.4 MG PO CAPS: 0.4000 mg | ORAL_CAPSULE | Freq: Every day | ORAL | 3 refills | Status: AC

## 2022-06-28 MED ORDER — CARBIDOPA-LEVODOPA 25-250 MG PO TABS: 1.0000 | ORAL_TABLET | Freq: Three times a day (TID) | ORAL | 3 refills | Status: AC

## 2022-06-28 MED ORDER — MIRTAZAPINE 15 MG PO TABS: ORAL_TABLET | ORAL | 3 refills | Status: AC

## 2022-06-28 MED ORDER — LORAZEPAM 2 MG PO TABS: 2.0000 mg | ORAL_TABLET | Freq: Three times a day (TID) | ORAL | 5 refills | Status: AC | PRN

## 2022-06-28 MED ORDER — METFORMIN HCL 500 MG PO TABS: 500.0000 mg | ORAL_TABLET | Freq: Two times a day (BID) | ORAL | 3 refills | Status: AC

## 2022-06-28 MED ORDER — ATORVASTATIN CALCIUM 40 MG PO TABS: 40.0000 mg | ORAL_TABLET | Freq: Every day | ORAL | 3 refills | Status: AC

## 2022-06-28 MED ORDER — METOPROLOL TARTRATE 25 MG PO TABS: 12.5000 mg | ORAL_TABLET | Freq: Two times a day (BID) | ORAL | 3 refills | Status: AC

## 2022-06-28 NOTE — Assessment & Plan Note (Signed)
On B12 

## 2022-06-28 NOTE — Assessment & Plan Note (Signed)
PT/OT

## 2022-06-28 NOTE — Assessment & Plan Note (Addendum)
Worse. [name] ran out of all meds; she has been out of lorazepam for couple days.  She is feeling nervous Cont/restart on Lorazepam prn  Potential benefits of a long term benzodiazepines  use as well as potential risks  and complications were explained to the patient and were aknowledged.

## 2022-06-28 NOTE — Progress Notes (Addendum)
Subjective:  Patient ID: [name], female    DOB: 08/10/1951  Age: 71 y.o. MRN: [id number]  CC: Follow-up   HPI [name] presents for anxiety, dyslidemia, asthma Maylyn ran out of all meds; she has been out of lorazepam for couple days.  She is feeling nervous.  She is here with her husband Tammy Sours.  Outpatient Medications Prior to Visit  Medication Sig Dispense Refill   albuterol (VENTOLIN HFA) 108 (90 Base) MCG/ACT inhaler Inhale 1-2 puffs into the lungs every 4 (four) hours as needed for wheezing or shortness of breath. 56 g 3   aspirin 81 MG EC tablet Take 81 mg by mouth daily.     B Complex-Folic Acid (B COMPLEX PLUS) TABS Take 1 tablet by mouth daily. 100 tablet 3   Blood Glucose Monitoring Suppl (ONETOUCH VERIO) w/Device KIT 1 Units by Does not apply route daily as needed. 1 kit 1   Cholecalciferol (VITAMIN D3) 50 MCG (2000 UT) capsule Take 1 capsule (2,000 Units total) by mouth daily. 100 capsule 3   diclofenac Sodium (VOLTAREN) 1 % GEL Apply 1 application topically 4 (four) times daily. 100 g 3   glucose blood (ONETOUCH VERIO) test strip Use to check blood sugars twice a day. Dx E11.9 50 each 11   multivitamin-lutein (OCUVITE-LUTEIN) CAPS capsule Take 1 capsule by mouth daily.     OneTouch Delica Lancets 33G MISC Use to help check blood sugars once a day. Dx E11.9 50 each 11   promethazine-dextromethorphan (PROMETHAZINE-DM) 6.25-15 MG/5ML syrup Take 5 mLs by mouth 4 (four) times daily as needed for cough. 240 mL 0   repaglinide (PRANDIN) 2 MG tablet TAKE TWO TABLETS BY MOUTH THREE TIMES DAILY BEFORE MEALS 360 tablet 3   vitamin B-12 (CYANOCOBALAMIN) 1000 MCG tablet Take 1 tablet (1,000 mcg total) by mouth daily. 100 tablet 0   atorvastatin (LIPITOR) 40 MG tablet Take 1 tablet (40 mg total) by mouth daily. Annual appt due in Aug must see provider for future refills 90 tablet 0   carbidopa-levodopa (SINEMET IR) 25-250 MG tablet Take 1 tablet by mouth 3 (three) times daily.  Annual appt due in Aug must see provider for future refills 90 tablet 0   LORazepam (ATIVAN) 2 MG tablet Take 1 tablet (2 mg total) by mouth every 8 (eight) hours as needed. for anxiety 90 tablet 3   metFORMIN (GLUCOPHAGE) 500 MG tablet TAKE ONE TABLET BY MOUTH TWICE DAILY WITH A MEAL 180 tablet 3   metoprolol tartrate (LOPRESSOR) 25 MG tablet TAKE 1/2 TABLET TWICE DAILY. 30 tablet 0   mirtazapine (REMERON) 15 MG tablet TAKE 1 TABLET BY MOUTH AT BEDTIME 6-8PM 30 tablet 0   tamsulosin (FLOMAX) 0.4 MG CAPS capsule TAKE (1) CAPSULE DAILY. (Patient taking differently: 0.4 mg at bedtime.) 90 capsule 3   HYDROmorphone (DILAUDID) 2 MG tablet Take 1 tablet (2 mg total) by mouth daily. (Patient not taking: Reported on 06/28/2022)     dapagliflozin propanediol (FARXIGA) 5 MG TABS tablet Take 1 tablet (5 mg total) by mouth daily before breakfast. (Patient not taking: Reported on 01/19/2022) 30 tablet 5   No facility-administered medications prior to visit.    ROS: Review of Systems  Constitutional:  Positive for fatigue. Negative for activity change, appetite change, chills and unexpected weight change.  HENT:  Negative for congestion, mouth sores and sinus pressure.   Eyes:  Negative for visual disturbance.  Respiratory:  Negative for cough and chest tightness.   Gastrointestinal:  Negative for abdominal pain and nausea.  Genitourinary:  Negative for difficulty urinating, frequency and vaginal pain.  Musculoskeletal:  Positive for arthralgias and gait problem. Negative for back pain.  Skin:  Negative for pallor and rash.  Neurological:  Negative for dizziness, tremors, weakness, numbness and headaches.  Hematological:  Does not bruise/bleed easily.  Psychiatric/Behavioral:  Positive for dysphoric mood. Negative for confusion, sleep disturbance and suicidal ideas. The patient is nervous/anxious.     Objective:  BP 120/62 (BP Location: Left Arm)   Pulse (!) 108   Temp 98.4 F (36.9 C) (Oral)   Ht  _0  (1.651 m)   Wt 122 lb 3.2 oz (55.4 kg)   SpO2 92%   BMI 20.34 kg/m   BP Readings from Last 3 Encounters:  06/29/22 108/65  06/28/22 120/62  01/29/22 (!) 92/52    Wt Readings from Last 3 Encounters:  06/29/22 122 lb (55.3 kg)  06/28/22 122 lb 3.2 oz (55.4 kg)  01/29/22 135 lb (61.2 kg)    Physical Exam Constitutional:      General: She is not in acute distress.    Appearance: Normal appearance. She is well-developed.  HENT:     Head: Normocephalic.     Right Ear: External ear normal.     Left Ear: External ear normal.     Nose: Nose normal.  Eyes:     General:        Right eye: No discharge.        Left eye: No discharge.     Conjunctiva/sclera: Conjunctivae normal.     Pupils: Pupils are equal, round, and reactive to light.  Neck:     Thyroid: No thyromegaly.     Vascular: No JVD.     Trachea: No tracheal deviation.  Cardiovascular:     Rate and Rhythm: Normal rate and regular rhythm.     Heart sounds: Normal heart sounds.  Pulmonary:     Effort: No respiratory distress.     Breath sounds: No stridor. No wheezing.  Abdominal:     General: Bowel sounds are normal. There is no distension.     Palpations: Abdomen is soft. There is no mass.     Tenderness: There is no abdominal tenderness. There is no guarding or rebound.  Musculoskeletal:        General: No tenderness.     Cervical back: Normal range of motion and neck supple. No rigidity.  Lymphadenopathy:     Cervical: No cervical adenopathy.  Skin:    Findings: No erythema or rash.  Neurological:     Mental Status: She is oriented to person, place, and time.     Cranial Nerves: No cranial nerve deficit.     Motor: No abnormal muscle tone.     Coordination: Coordination normal.     Gait: Gait abnormal.     Deep Tendon Reflexes: Reflexes normal.  Psychiatric:        Behavior: Behavior normal.        Thought Content: Thought content normal.        Judgment: Judgment normal.   In a w/c  Lab  Results  Component Value Date   WBC 4.7 01/19/2022   HGB 11.4 (L) 01/19/2022   HCT 37.4 01/19/2022   PLT 174 01/19/2022   GLUCOSE 223 (H) 01/19/2022   CHOL 218 (H) 01/20/2022   TRIG 130 01/20/2022   HDL 84 01/20/2022   LDLDIRECT 100.8 10/26/2013   LDLCALC 108 (H) 01/20/2022  ALT <5 01/19/2022   AST 11 (L) 01/19/2022   NA 139 01/19/2022   K 4.1 01/19/2022   CL 105 01/19/2022   CREATININE 0.73 01/19/2022   BUN 12 01/19/2022   CO2 26 01/19/2022   TSH 1.168 01/19/2022   INR 1.12 11/08/2016   HGBA1C 7.7 (H) 08/04/2021    ECHOCARDIOGRAM COMPLETE  Result Date: 01/20/2022    ECHOCARDIOGRAM REPORT   Patient Name:   JOYEL CHENETTE Date of Exam: 01/20/2022 Medical Rec #:  [id number]    Height:       65.0 in Accession #:    [id number]   Weight:       131.0 lb Date of Birth:  Sep 19, 1951   BSA:          1.653 m Patient Age:    40 years     BP:           117/65 mmHg Patient Gender: F            HR:           77 bpm. Exam Location:  Inpatient Procedure: 2D Echo Indications:    TIA  History:        Patient has prior history of Echocardiogram examinations, most                 recent 07/17/2019. TIA and COPD; Risk Factors:Diabetes.  Sonographer:    Johny Chess RDCS Referring Phys: [id number] Lequita Halt  Sonographer Comments: Image acquisition challenging due to breast implants. IMPRESSIONS  1. Left ventricular ejection fraction, by estimation, is 60 to 65%. The left ventricle has normal function. The left ventricle has no regional wall motion abnormalities. Left ventricular diastolic parameters are indeterminate.  2. Right ventricular systolic function is normal. The right ventricular size is normal. There is mildly elevated pulmonary artery systolic pressure. The estimated right ventricular systolic pressure is 53.6 mmHg.  3. The mitral valve is grossly normal. Trivial mitral valve regurgitation.  4. The aortic valve is tricuspid. Aortic valve regurgitation is not visualized. Aortic valve sclerosis is  present, with no evidence of aortic valve stenosis.  5. The inferior vena cava is normal in size with greater than 50% respiratory variability, suggesting right atrial pressure of 3 mmHg. Comparison(s): Prior images reviewed side by side. LVEF remains normal. No obvious interatrial communication. FINDINGS  Left Ventricle: Left ventricular ejection fraction, by estimation, is 60 to 65%. The left ventricle has normal function. The left ventricle has no regional wall motion abnormalities. The left ventricular internal cavity size was normal in size. There is  no left ventricular hypertrophy. Left ventricular diastolic parameters are indeterminate. Right Ventricle: The right ventricular size is normal. No increase in right ventricular wall thickness. Right ventricular systolic function is normal. There is mildly elevated pulmonary artery systolic pressure. The tricuspid regurgitant velocity is 2.90  m/s, and with an assumed right atrial pressure of 3 mmHg, the estimated right ventricular systolic pressure is 64.4 mmHg. Left Atrium: Left atrial size was normal in size. Right Atrium: Right atrial size was normal in size. Pericardium: There is no evidence of pericardial effusion. Presence of epicardial fat layer. Mitral Valve: The mitral valve is grossly normal. Trivial mitral valve regurgitation. Tricuspid Valve: The tricuspid valve is grossly normal. Tricuspid valve regurgitation is trivial. Aortic Valve: The aortic valve is tricuspid. There is mild aortic valve annular calcification. Aortic valve regurgitation is not visualized. Aortic valve sclerosis is present, with no evidence of aortic valve stenosis. Pulmonic Valve:  The pulmonic valve was grossly normal. Pulmonic valve regurgitation is trivial. Aorta: The aortic root is normal in size and structure. Venous: The inferior vena cava is normal in size with greater than 50% respiratory variability, suggesting right atrial pressure of 3 mmHg. IAS/Shunts: No atrial level  shunt detected by color flow Doppler.  LEFT VENTRICLE PLAX 2D LVIDd:         4.30 cm     Diastology LVIDs:         2.70 cm     LV e' medial:    7.40 cm/s LV PW:         0.80 cm     LV E/e' medial:  8.4 LV IVS:        0.90 cm     LV e' lateral:   10.20 cm/s LVOT diam:     1.80 cm     LV E/e' lateral: 6.1 LV SV:         44 LV SV Index:   27 LVOT Area:     2.54 cm  LV Volumes (MOD) LV vol d, MOD A4C: 52.5 ml LV vol s, MOD A4C: 24.2 ml LV SV MOD A4C:     52.5 ml RIGHT VENTRICLE             IVC RV S prime:     14.80 cm/s  IVC diam: 1.20 cm TAPSE (M-mode): 1.6 cm LEFT ATRIUM             Index LA diam:        3.20 cm 1.94 cm/m LA Vol (A2C):   35.2 ml 21.30 ml/m LA Vol (A4C):   24.4 ml 14.76 ml/m LA Biplane Vol: 30.5 ml 18.46 ml/m  AORTIC VALVE LVOT Vmax:   102.00 cm/s LVOT Vmean:  66.300 cm/s LVOT VTI:    0.174 m  AORTA Ao Root diam: 3.10 cm MITRAL VALVE               TRICUSPID VALVE MV Area (PHT): 3.72 cm    TR Peak grad:   33.6 mmHg MV Decel Time: 204 msec    TR Vmax:        290.00 cm/s MV E velocity: 62.20 cm/s MV A velocity: 72.40 cm/s  SHUNTS MV E/A ratio:  0.86        Systemic VTI:  0.17 m                            Systemic Diam: 1.80 cm Rozann Lesches MD Electronically signed by Rozann Lesches MD Signature Date/Time: 01/20/2022/2:12:21 PM    Final    CT ANGIO HEAD W OR WO CONTRAST  Result Date: 01/19/2022 CLINICAL DATA:  Stroke follow-up EXAM: CT ANGIOGRAPHY HEAD AND NECK TECHNIQUE: Multidetector CT imaging of the head and neck was performed using the standard protocol during bolus administration of intravenous contrast. Multiplanar CT image reconstructions and MIPs were obtained to evaluate the vascular anatomy. Carotid stenosis measurements (when applicable) are obtained utilizing NASCET criteria, using the distal internal carotid diameter as the denominator. RADIATION DOSE REDUCTION: This exam was performed according to the departmental dose-optimization program which includes automated exposure  control, adjustment of the mA and/or kV according to patient size and/or use of iterative reconstruction technique. CONTRAST:  99m OMNIPAQUE IOHEXOL 350 MG/ML SOLN COMPARISON:  None. FINDINGS: CT HEAD FINDINGS Brain: There is no mass, hemorrhage or extra-axial collection. The size and configuration of the ventricles and extra-axial CSF  spaces are normal. There is no acute or chronic infarction. There is hypoattenuation of the periventricular white matter, most commonly indicating chronic ischemic microangiopathy. Skull: The visualized skull base, calvarium and extracranial soft tissues are normal. Sinuses/Orbits: No fluid levels or advanced mucosal thickening of the visualized paranasal sinuses. No mastoid or middle ear effusion. The orbits are normal. CTA NECK FINDINGS SKELETON: There is no bony spinal canal stenosis. No lytic or blastic lesion. OTHER NECK: Normal pharynx, larynx and major salivary glands. No cervical lymphadenopathy. Unremarkable thyroid gland. UPPER CHEST: No pneumothorax or pleural effusion. No nodules or masses. AORTIC ARCH: There is calcific atherosclerosis of the aortic arch. There is no aneurysm, dissection or hemodynamically significant stenosis of the visualized portion of the aorta. Conventional 3 vessel aortic branching pattern. The visualized proximal subclavian arteries are widely patent. RIGHT CAROTID SYSTEM: No dissection, occlusion or aneurysm. Mild atherosclerotic calcification at the carotid bifurcation without hemodynamically significant stenosis. LEFT CAROTID SYSTEM: No dissection, occlusion or aneurysm. Mild atherosclerotic calcification at the carotid bifurcation without hemodynamically significant stenosis. VERTEBRAL ARTERIES: Left dominant configuration. Both origins are clearly patent. There is no dissection, occlusion or flow-limiting stenosis to the skull base (V1-V3 segments). CTA HEAD FINDINGS POSTERIOR CIRCULATION: --Vertebral arteries: Normal V4 segments. --Inferior  cerebellar arteries: Normal. --Basilar artery: Normal. --Superior cerebellar arteries: Normal. --Posterior cerebral arteries (PCA): Normal. ANTERIOR CIRCULATION: --Intracranial internal carotid arteries: Atherosclerotic calcification of the internal carotid arteries at the skull base without hemodynamically significant stenosis. --Anterior cerebral arteries (ACA): Normal. Both A1 segments are present. Patent anterior communicating artery (a-comm). --Middle cerebral arteries (MCA): Normal. VENOUS SINUSES: As permitted by contrast timing, patent. ANATOMIC VARIANTS: Fetal origin of the left posterior cerebral artery. Review of the MIP images confirms the above findings. IMPRESSION: 1. No emergent large vessel occlusion or hemodynamically significant stenosis of the head or neck. 2. Chronic ischemic microangiopathy. Aortic Atherosclerosis (ICD10-I70.0). Electronically Signed   By: Ulyses Jarred M.D.   On: 01/19/2022 22:15   CT ANGIO NECK W OR WO CONTRAST  Result Date: 01/19/2022 CLINICAL DATA:  Stroke follow-up EXAM: CT ANGIOGRAPHY HEAD AND NECK TECHNIQUE: Multidetector CT imaging of the head and neck was performed using the standard protocol during bolus administration of intravenous contrast. Multiplanar CT image reconstructions and MIPs were obtained to evaluate the vascular anatomy. Carotid stenosis measurements (when applicable) are obtained utilizing NASCET criteria, using the distal internal carotid diameter as the denominator. RADIATION DOSE REDUCTION: This exam was performed according to the departmental dose-optimization program which includes automated exposure control, adjustment of the mA and/or kV according to patient size and/or use of iterative reconstruction technique. CONTRAST:  88m OMNIPAQUE IOHEXOL 350 MG/ML SOLN COMPARISON:  None. FINDINGS: CT HEAD FINDINGS Brain: There is no mass, hemorrhage or extra-axial collection. The size and configuration of the ventricles and extra-axial CSF spaces are  normal. There is no acute or chronic infarction. There is hypoattenuation of the periventricular white matter, most commonly indicating chronic ischemic microangiopathy. Skull: The visualized skull base, calvarium and extracranial soft tissues are normal. Sinuses/Orbits: No fluid levels or advanced mucosal thickening of the visualized paranasal sinuses. No mastoid or middle ear effusion. The orbits are normal. CTA NECK FINDINGS SKELETON: There is no bony spinal canal stenosis. No lytic or blastic lesion. OTHER NECK: Normal pharynx, larynx and major salivary glands. No cervical lymphadenopathy. Unremarkable thyroid gland. UPPER CHEST: No pneumothorax or pleural effusion. No nodules or masses. AORTIC ARCH: There is calcific atherosclerosis of the aortic arch. There is no aneurysm, dissection or hemodynamically significant  stenosis of the visualized portion of the aorta. Conventional 3 vessel aortic branching pattern. The visualized proximal subclavian arteries are widely patent. RIGHT CAROTID SYSTEM: No dissection, occlusion or aneurysm. Mild atherosclerotic calcification at the carotid bifurcation without hemodynamically significant stenosis. LEFT CAROTID SYSTEM: No dissection, occlusion or aneurysm. Mild atherosclerotic calcification at the carotid bifurcation without hemodynamically significant stenosis. VERTEBRAL ARTERIES: Left dominant configuration. Both origins are clearly patent. There is no dissection, occlusion or flow-limiting stenosis to the skull base (V1-V3 segments). CTA HEAD FINDINGS POSTERIOR CIRCULATION: --Vertebral arteries: Normal V4 segments. --Inferior cerebellar arteries: Normal. --Basilar artery: Normal. --Superior cerebellar arteries: Normal. --Posterior cerebral arteries (PCA): Normal. ANTERIOR CIRCULATION: --Intracranial internal carotid arteries: Atherosclerotic calcification of the internal carotid arteries at the skull base without hemodynamically significant stenosis. --Anterior cerebral  arteries (ACA): Normal. Both A1 segments are present. Patent anterior communicating artery (a-comm). --Middle cerebral arteries (MCA): Normal. VENOUS SINUSES: As permitted by contrast timing, patent. ANATOMIC VARIANTS: Fetal origin of the left posterior cerebral artery. Review of the MIP images confirms the above findings. IMPRESSION: 1. No emergent large vessel occlusion or hemodynamically significant stenosis of the head or neck. 2. Chronic ischemic microangiopathy. Aortic Atherosclerosis (ICD10-I70.0). Electronically Signed   By: Ulyses Jarred M.D.   On: 01/19/2022 22:15   MR BRAIN WO CONTRAST  Result Date: 01/19/2022 CLINICAL DATA:  Neuro deficit, acute, stroke suspected Improving speech abnormality this morning. Also lightheadedness and hypoxia of unexplained origin. EXAM: MRI HEAD WITHOUT CONTRAST TECHNIQUE: Multiplanar, multiecho pulse sequences of the brain and surrounding structures were obtained without intravenous contrast. COMPARISON:  MRI head 10/04/2017. FINDINGS: Brain: No acute infarction, hemorrhage, hydrocephalus, extra-axial collection or mass lesion. Mild for age T2/FLAIR hyperintensities in the white matter, nonspecific but compatible with chronic microvascular disease. Cerebral atrophy. Vascular: Major arterial flow voids are maintained at the skull base. Skull and upper cervical spine: Normal marrow signal. Sinuses/Orbits: Minimal paranasal sinus mucosal thickening. Unremarkable orbits. Other: Trace mastoid effusions. IMPRESSION: No evidence of acute intracranial abnormality. Electronically Signed   By: Margaretha Sheffield M.D.   On: 01/19/2022 15:06   DG Chest Portable 1 View  Result Date: 01/19/2022 CLINICAL DATA:  Hypoxia. EXAM: PORTABLE CHEST 1 VIEW COMPARISON:  July 16, 2019. FINDINGS: The heart size and mediastinal contours are within normal limits. Both lungs are clear. The visualized skeletal structures are unremarkable. IMPRESSION: No active disease. Electronically Signed    By: Marijo Conception M.D.   On: 01/19/2022 13:56    Assessment & Plan:   Problem List Items Addressed This Visit     Abnormality of gait    PT/OT      Anxiety disorder - Primary    Worse. Gratia ran out of all meds; she has been out of lorazepam for couple days.  She is feeling nervous Cont/restart on Lorazepam prn  Potential benefits of a long term benzodiazepines  use as well as potential risks  and complications were explained to the patient and were aknowledged.      Relevant Medications   LORazepam (ATIVAN) 2 MG tablet   mirtazapine (REMERON) 15 MG tablet   Other Relevant Orders   CBC with Differential/Platelet   Comprehensive metabolic panel   Hemoglobin A1c   TSH   B12 deficiency    On B12      Relevant Orders   CBC with Differential/Platelet   Comprehensive metabolic panel   Hemoglobin A1c   TSH   Depression    Wonder ran out of all meds; she has been out  of lorazepam for couple days.  She is feeling nervous Will restart lorazepam and mirtazapine      Relevant Medications   LORazepam (ATIVAN) 2 MG tablet   mirtazapine (REMERON) 15 MG tablet   DM2 (diabetes mellitus, type 2) (HCC)    Chronic On Metformin      Relevant Medications   atorvastatin (LIPITOR) 40 MG tablet   metFORMIN (GLUCOPHAGE) 500 MG tablet   Other Relevant Orders   CBC with Differential/Platelet   Comprehensive metabolic panel   Hemoglobin A1c   TSH   Other Visit Diagnoses     Needs flu shot       Relevant Orders   Flu Vaccine QUAD High Dose(Fluad) (Completed)         Meds ordered this encounter  Medications   LORazepam (ATIVAN) 2 MG tablet    Sig: Take 1 tablet (2 mg total) by mouth every 8 (eight) hours as needed for anxiety. for anxiety    Dispense:  90 tablet    Refill:  5   atorvastatin (LIPITOR) 40 MG tablet    Sig: Take 1 tablet (40 mg total) by mouth daily. Annual appt due in Aug must see provider for future refills    Dispense:  90 tablet    Refill:  3    carbidopa-levodopa (SINEMET IR) 25-250 MG tablet    Sig: Take 1 tablet by mouth 3 (three) times daily. Annual appt due in Aug must see provider for future refills    Dispense:  90 tablet    Refill:  3   metFORMIN (GLUCOPHAGE) 500 MG tablet    Sig: Take 1 tablet (500 mg total) by mouth 2 (two) times daily with a meal.    Dispense:  180 tablet    Refill:  3   metoprolol tartrate (LOPRESSOR) 25 MG tablet    Sig: Take 0.5 tablets (12.5 mg total) by mouth 2 (two) times daily.    Dispense:  90 tablet    Refill:  3    Annual appt due in Aug must see provider for future refills   mirtazapine (REMERON) 15 MG tablet    Sig: TAKE 1 TABLET BY MOUTH AT BEDTIME 6-8PM    Dispense:  90 tablet    Refill:  3    Annual appt due in Aug must see provider for future refills   tamsulosin (FLOMAX) 0.4 MG CAPS capsule    Sig: Take 1 capsule (0.4 mg total) by mouth at bedtime.    Dispense:  90 capsule    Refill:  3    This prescription was filled on 02/23/2021. Any refills authorized will be placed on file.      Follow-up: Return in about 3 months (around 09/27/2022) for a follow-up visit.  Walker Kehr, MD

## 2022-06-28 NOTE — Assessment & Plan Note (Addendum)
[name] ran out of all meds; she has been out of lorazepam for couple days.  She is feeling nervous Will restart lorazepam and mirtazapine

## 2022-06-28 NOTE — Assessment & Plan Note (Signed)
Chronic On Metformin 

## 2022-06-29 ENCOUNTER — Encounter (HOSPITAL_COMMUNITY): Payer: Self-pay

## 2022-06-29 ENCOUNTER — Other Ambulatory Visit: Payer: Self-pay

## 2022-06-29 ENCOUNTER — Emergency Department (HOSPITAL_COMMUNITY): Payer: Medicare Other

## 2022-06-29 ENCOUNTER — Emergency Department (HOSPITAL_COMMUNITY)
Admission: EM | Admit: 2022-06-29 | Discharge: 2022-07-05 | Disposition: A | Discharge status: Home / Self Care | Payer: Medicare Other | Attending: Emergency Medicine | Admitting: Emergency Medicine

## 2022-06-29 DIAGNOSIS — G9389 Other specified disorders of brain: Secondary | ICD-10-CM | POA: Diagnosis not present

## 2022-06-29 DIAGNOSIS — Z7984 Long term (current) use of oral hypoglycemic drugs: Secondary | ICD-10-CM | POA: Insufficient documentation

## 2022-06-29 DIAGNOSIS — S60021A Contusion of right index finger without damage to nail, initial encounter: Secondary | ICD-10-CM | POA: Diagnosis not present

## 2022-06-29 DIAGNOSIS — E119 Type 2 diabetes mellitus without complications: Secondary | ICD-10-CM | POA: Diagnosis not present

## 2022-06-29 DIAGNOSIS — M7989 Other specified soft tissue disorders: Secondary | ICD-10-CM | POA: Diagnosis not present

## 2022-06-29 DIAGNOSIS — G319 Degenerative disease of nervous system, unspecified: Secondary | ICD-10-CM | POA: Diagnosis not present

## 2022-06-29 DIAGNOSIS — Y92009 Unspecified place in unspecified non-institutional (private) residence as the place of occurrence of the external cause: Secondary | ICD-10-CM | POA: Insufficient documentation

## 2022-06-29 DIAGNOSIS — G20C Parkinsonism, unspecified: Secondary | ICD-10-CM | POA: Insufficient documentation

## 2022-06-29 DIAGNOSIS — S6991XA Unspecified injury of right wrist, hand and finger(s), initial encounter: Secondary | ICD-10-CM | POA: Diagnosis present

## 2022-06-29 DIAGNOSIS — S0990XA Unspecified injury of head, initial encounter: Secondary | ICD-10-CM | POA: Diagnosis not present

## 2022-06-29 DIAGNOSIS — R69 Illness, unspecified: Secondary | ICD-10-CM | POA: Diagnosis not present

## 2022-06-29 DIAGNOSIS — R2681 Unsteadiness on feet: Secondary | ICD-10-CM | POA: Insufficient documentation

## 2022-06-29 DIAGNOSIS — S60011A Contusion of right thumb without damage to nail, initial encounter: Secondary | ICD-10-CM | POA: Insufficient documentation

## 2022-06-29 DIAGNOSIS — M25531 Pain in right wrist: Secondary | ICD-10-CM | POA: Diagnosis not present

## 2022-06-29 DIAGNOSIS — R260 Ataxic gait: Secondary | ICD-10-CM | POA: Insufficient documentation

## 2022-06-29 DIAGNOSIS — I6782 Cerebral ischemia: Secondary | ICD-10-CM | POA: Diagnosis not present

## 2022-06-29 MED ORDER — LORAZEPAM 1 MG PO TABS
2.0000 mg | ORAL_TABLET | Freq: Three times a day (TID) | ORAL | Status: DC | PRN
  Administered 2022-06-30 – 2022-07-04 (×11): 2 mg via ORAL
  Filled 2022-06-29 (×11): qty 2

## 2022-06-29 MED ORDER — CARBIDOPA-LEVODOPA 25-250 MG PO TABS
1.0000 | ORAL_TABLET | Freq: Three times a day (TID) | ORAL | Status: DC
  Administered 2022-06-30 – 2022-07-04 (×15): 1 via ORAL
  Filled 2022-06-29 (×16): qty 1

## 2022-06-29 MED ORDER — ATORVASTATIN CALCIUM 40 MG PO TABS
40.0000 mg | ORAL_TABLET | Freq: Every day | ORAL | Status: DC
  Administered 2022-06-30 – 2022-07-04 (×5): 40 mg via ORAL
  Filled 2022-06-29 (×5): qty 1

## 2022-06-29 MED ORDER — METFORMIN HCL 500 MG PO TABS
500.0000 mg | ORAL_TABLET | Freq: Two times a day (BID) | ORAL | Status: DC
  Administered 2022-06-30 – 2022-07-04 (×10): 500 mg via ORAL
  Filled 2022-06-29 (×10): qty 1

## 2022-06-29 MED ORDER — METOPROLOL TARTRATE 25 MG PO TABS
12.5000 mg | ORAL_TABLET | Freq: Two times a day (BID) | ORAL | Status: DC
  Administered 2022-06-30 – 2022-07-04 (×8): 12.5 mg via ORAL
  Filled 2022-06-29 (×11): qty 1

## 2022-06-29 MED ORDER — TAMSULOSIN HCL 0.4 MG PO CAPS
0.4000 mg | ORAL_CAPSULE | Freq: Every day | ORAL | Status: DC
  Administered 2022-06-30 – 2022-07-04 (×6): 0.4 mg via ORAL
  Filled 2022-06-29 (×7): qty 1
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
## 2022-06-29 NOTE — ED Notes (Signed)
Pt speaking with SW at this time.

## 2022-06-29 NOTE — Progress Notes (Signed)
TOC

## 2022-06-29 NOTE — ED Notes (Addendum)
Pt taken to restroom on [address]. Hats placed in toilet to collect urine sample. Pt advised to pull call cord when she is finished. Pt understood and agrees.
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
## 2022-06-30 DIAGNOSIS — S60021A Contusion of right index finger without damage to nail, initial encounter: Secondary | ICD-10-CM | POA: Diagnosis not present

## 2022-06-30 DIAGNOSIS — M25531 Pain in right wrist: Secondary | ICD-10-CM | POA: Diagnosis not present

## 2022-06-30 DIAGNOSIS — S60011A Contusion of right thumb without damage to nail, initial encounter: Secondary | ICD-10-CM | POA: Diagnosis not present

## 2022-06-30 MED ORDER — ACETAMINOPHEN 325 MG PO TABS
650.0000 mg | ORAL_TABLET | Freq: Four times a day (QID) | ORAL | Status: DC | PRN
  Administered 2022-06-30 – 2022-07-03 (×3): 650 mg via ORAL
  Filled 2022-06-30 (×3): qty 2

## 2022-06-30 MED ORDER — MIRTAZAPINE 7.5 MG PO TABS
15.0000 mg | ORAL_TABLET | Freq: Every day | ORAL | Status: DC
  Administered 2022-06-30 – 2022-07-04 (×5): 15 mg via ORAL
  Filled 2022-06-30 (×5): qty 2

## 2022-06-30 MED ORDER — ASPIRIN 81 MG PO TBEC
81.0000 mg | DELAYED_RELEASE_TABLET | Freq: Every day | ORAL | Status: DC
  Administered 2022-06-30 – 2022-07-04 (×5): 81 mg via ORAL
  Filled 2022-06-30 (×6): qty 1

## 2022-06-30 NOTE — ED Notes (Signed)
Pt assisted to the restroom
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
## 2022-06-30 NOTE — Social Work (Signed)
CSW spoke to the Pt niece, the niece lives in [place]. The Niece also stated that she is working on getting a Chief Executive Officer to help the Pt with long term care. The niece also stated that the Pt can not be alone, as she is unable to care for herself. CSW will keep updating as information goes.
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
## 2022-06-30 NOTE — Progress Notes (Signed)
Pt's Niece has called and requested [employer], CSW explained to the niece that we are not in control and to please review a few/ more even out side the county of [place] just to ensure she is aware that placement is determined by many factors.

## 2022-06-30 NOTE — NC FL2 (Signed)
[place] LEVEL OF CARE SCREENING TOOL     IDENTIFICATION  Patient Name: [name] Birthdate: 03-25-1951 Sex: female Admission Date (Current Location): 06/29/2022  [facility] and [place] Number:  Herbalist and Address:  [facility],  [address] [place], [place]      Provider Number: [id number]  Attending Physician Name and Address:  Default, Provider, MD  Relative Name and Phone Number:       Current Level of Care: Hospital Recommended Level of Care: [place] Prior Approval Number:    Date Approved/Denied: 06/30/22 PASRR Number: [id number]  Discharge Plan: SNF    Current Diagnoses: Patient Active Problem List   Diagnosis Date Noted   History of transient ischemic attack (TIA) 01/29/2022   Hypomagnesemia 01/20/2022   TIA (transient ischemic attack) 01/19/2022   Asthmatic bronchitis with acute exacerbation 12/14/2021   Allergic rhinitis 12/14/2021   Trigger thumb of right hand 11/14/2020   Physical deconditioning 07/17/2019   Acute respiratory failure with hypoxia ([place]) 07/16/2019   Knee pain, left 10/21/2018   Diabetic foot ulcer ([place]) 08/19/2018   Weakness 10/04/2017   Acute encephalopathy 10/04/2017   Acute lower UTI 10/04/2017   Aortic atherosclerosis ([place]) 10/04/2017   Grief 09/23/2017   Osteoporosis 04/16/2017   Irregular heart beat 11/15/2016   Hyperglycemia    Oral thrush 07/18/2016   Odynophagia 07/18/2016   History of anoxic brain injury 12/29/2015   Gait disorder 12/28/2015   Well adult exam 12/08/2014   Fracture of left humerus 06/24/2014   Humerus fracture 06/24/2014   Left elbow pain 11/03/2013   Ankle ulcer ([place]) 04/02/2012   Decubitus ulcer of coccyx 05/21/2011   B12 deficiency 04/04/2010   HYPERLIPIDEMIA 04/04/2010   Anxiety disorder 11/14/2009   Weight loss 11/14/2009   Vitamin D deficiency 10/21/2009   DM2 (diabetes mellitus, type 2) ([place]) 07/07/2009   COPD (chronic  obstructive pulmonary disease) ([place]) 02/25/2009   Osteoarthritis 02/25/2009   LOW BACK PAIN 02/25/2009   TOBACCO USER 01/13/2009   Depression 02/26/2007   PAIN, CHRONIC NEC 02/26/2007   Venous (peripheral) insufficiency 02/26/2007   GERD 02/26/2007   HERNIA, INCISIONAL 02/26/2007   SHOULDER PAIN, LEFT 02/26/2007   Seizure as late effect of cerebrovascular accident (CVA) ([place]) 02/26/2007   INSOMNIA 02/26/2007   Abnormality of gait 02/26/2007   LEG EDEMA, CHRONIC 02/26/2007    Orientation RESPIRATION BLADDER Height & Weight     Self, Time, Situation, Place  Normal Continent Weight: 122 lb (55.3 kg) Height:  5' 5" (165.1 cm)  BEHAVIORAL SYMPTOMS/MOOD NEUROLOGICAL BOWEL NUTRITION STATUS      Continent    AMBULATORY STATUS COMMUNICATION OF NEEDS Skin   Limited Assist Verbally Normal                       Personal Care Assistance Level of Assistance  Bathing, Feeding, Dressing, Total care Bathing Assistance: Limited assistance Feeding assistance: Limited assistance Dressing Assistance: Limited assistance Total Care Assistance: Limited assistance   Functional Limitations Info  Sight, Hearing, Speech Sight Info: Adequate Hearing Info: Adequate Speech Info: Adequate ([id number])    SPECIAL CARE FACTORS FREQUENCY                       Contractures Contractures Info: Not present    Additional Factors Info  Code Status, Allergies Code Status Info: Prior Allergies Info: Aspirin / predonisone  Current Medications (06/30/2022):  This is the current hospital active medication list Current Facility-Administered Medications  Medication Dose Route Frequency Provider Last Rate Last Admin   acetaminophen (TYLENOL) tablet 650 mg  650 mg Oral Q6H PRN Antonietta Breach, PA-C   650 mg at 06/30/22 0444   aspirin EC tablet 81 mg  81 mg Oral Daily Antonietta Breach, PA-C       atorvastatin (LIPITOR) tablet 40 mg  40 mg Oral Daily Antonietta Breach, PA-C       carbidopa-levodopa  (SINEMET IR) 25-250 MG per tablet immediate release 1 tablet  1 tablet Oral TID Antonietta Breach, PA-C       LORazepam (ATIVAN) tablet 2 mg  2 mg Oral Q8H PRN Antonietta Breach, PA-C   2 mg at 06/30/22 0012   metFORMIN (GLUCOPHAGE) tablet 500 mg  500 mg Oral BID WC Antonietta Breach, PA-C       metoprolol tartrate (LOPRESSOR) tablet 12.5 mg  12.5 mg Oral BID Antonietta Breach, PA-C   12.5 mg at 06/30/22 0010   mirtazapine (REMERON) tablet 15 mg  15 mg Oral QHS Antonietta Breach, PA-C       tamsulosin (FLOMAX) capsule 0.4 mg  0.4 mg Oral QHS Antonietta Breach, PA-C   0.4 mg at 06/30/22 0011   Current Outpatient Medications  Medication Sig Dispense Refill   albuterol (VENTOLIN HFA) 108 (90 Base) MCG/ACT inhaler Inhale 1-2 puffs into the lungs every 4 (four) hours as needed for wheezing or shortness of breath. 56 g 3   aspirin 81 MG EC tablet Take 81 mg by mouth daily.     atorvastatin (LIPITOR) 40 MG tablet Take 1 tablet (40 mg total) by mouth daily. Annual appt due in Aug must see provider for future refills 90 tablet 3   B Complex-Folic Acid (B COMPLEX PLUS) TABS Take 1 tablet by mouth daily. 100 tablet 3   Blood Glucose Monitoring Suppl (ONETOUCH VERIO) w/Device KIT 1 Units by Does not apply route daily as needed. 1 kit 1   carbidopa-levodopa (SINEMET IR) 25-250 MG tablet Take 1 tablet by mouth 3 (three) times daily. Annual appt due in Aug must see provider for future refills 90 tablet 3   Cholecalciferol (VITAMIN D3) 50 MCG (2000 UT) capsule Take 1 capsule (2,000 Units total) by mouth daily. 100 capsule 3   diclofenac Sodium (VOLTAREN) 1 % GEL Apply 1 application topically 4 (four) times daily. 100 g 3   glucose blood (ONETOUCH VERIO) test strip Use to check blood sugars twice a day. Dx E11.9 50 each 11   HYDROmorphone (DILAUDID) 2 MG tablet Take 1 tablet (2 mg total) by mouth daily. (Patient not taking: Reported on 06/28/2022)     LORazepam (ATIVAN) 2 MG tablet Take 1 tablet (2 mg total) by mouth every 8 (eight) hours as  needed for anxiety. for anxiety 90 tablet 5   metFORMIN (GLUCOPHAGE) 500 MG tablet Take 1 tablet (500 mg total) by mouth 2 (two) times daily with a meal. 180 tablet 3   metoprolol tartrate (LOPRESSOR) 25 MG tablet Take 0.5 tablets (12.5 mg total) by mouth 2 (two) times daily. 90 tablet 3   mirtazapine (REMERON) 15 MG tablet TAKE 1 TABLET BY MOUTH AT BEDTIME 6-8PM 90 tablet 3   multivitamin-lutein (OCUVITE-LUTEIN) CAPS capsule Take 1 capsule by mouth daily.     OneTouch Delica Lancets 54D MISC Use to help check blood sugars once a day. Dx E11.9 50 each 11   promethazine-dextromethorphan (PROMETHAZINE-DM) 6.25-15 MG/5ML syrup  Take 5 mLs by mouth 4 (four) times daily as needed for cough. 240 mL 0   repaglinide (PRANDIN) 2 MG tablet TAKE TWO TABLETS BY MOUTH THREE TIMES DAILY BEFORE MEALS 360 tablet 3   tamsulosin (FLOMAX) 0.4 MG CAPS capsule Take 1 capsule (0.4 mg total) by mouth at bedtime. 90 capsule 3   vitamin B-12 (CYANOCOBALAMIN) 1000 MCG tablet Take 1 tablet (1,000 mcg total) by mouth daily. 100 tablet 0     Discharge Medications: Please see discharge summary for a list of discharge medications.  Relevant Imaging Results:  Relevant Lab Results:   Additional Information [place] Daaiyah Baumert LCSW-A SSI # [id number]  [address] [place], [place]

## 2022-06-30 NOTE — ED Notes (Signed)
Pt calm and cooperative throughout night. No notable distress/discomfort observed.

## 2022-06-30 NOTE — ED Notes (Signed)
Assisted pt to the bathroom. One person, limited assistance. Pt used walker, while I held on to the gait applied on the pt. Pt experienced no difficulty ambulating to the bathroom.

## 2022-06-30 NOTE — Progress Notes (Signed)
Niece has called back asking for [facility] states the Pt has a cousin near [place] advised the niece I will note that request however can not guarantee any facilities.

## 2022-07-01 DIAGNOSIS — S60011A Contusion of right thumb without damage to nail, initial encounter: Secondary | ICD-10-CM | POA: Diagnosis not present

## 2022-07-01 DIAGNOSIS — M25531 Pain in right wrist: Secondary | ICD-10-CM | POA: Diagnosis not present

## 2022-07-01 DIAGNOSIS — S60021A Contusion of right index finger without damage to nail, initial encounter: Secondary | ICD-10-CM | POA: Diagnosis not present
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
## 2022-07-01 NOTE — Progress Notes (Signed)
There are currently no bed offers at this time likely due to admissions reps being off on the weekend. TOC will follow up with agencies tomorrow morning. Bed offers pending.   Addend @ 10:30 AM This CSW presented bed offer at [facility] to the pt and her daughter. They wish to accept the bed offer. This CSW has accepted [facility] in the HUB. The pt is able to be transported by private vehicle, per Karen Hill, PT and will be transported to the facility by her daughter, Julia.  

## 2022-07-02 DIAGNOSIS — M25531 Pain in right wrist: Secondary | ICD-10-CM | POA: Diagnosis not present

## 2022-07-02 DIAGNOSIS — S60011A Contusion of right thumb without damage to nail, initial encounter: Secondary | ICD-10-CM | POA: Diagnosis not present

## 2022-07-02 DIAGNOSIS — S60021A Contusion of right index finger without damage to nail, initial encounter: Secondary | ICD-10-CM | POA: Diagnosis not present

## 2022-07-02 NOTE — Progress Notes (Signed)
CSW called Lexine Baton with [facility], left voicemail. TOC is still awaiting bed offers.

## 2022-07-02 NOTE — Progress Notes (Signed)
Niece Joelene Millin called and stated she no longer wants to be involved in her aunt's care. Joelene Millin states "her son is finally convinced that he is responsible for her care." This CSW confirmed the son's contact information.
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
## 2022-07-02 NOTE — ED Notes (Signed)
Pt. Ambulated with walker to the BR using 2 person assist.

## 2022-07-02 NOTE — ED Notes (Signed)
RN notified about pt oxygen
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
## 2022-07-02 NOTE — ED Notes (Signed)
Patient currently in the shower.

## 2022-07-03 DIAGNOSIS — M25531 Pain in right wrist: Secondary | ICD-10-CM | POA: Diagnosis not present

## 2022-07-03 DIAGNOSIS — S60021A Contusion of right index finger without damage to nail, initial encounter: Secondary | ICD-10-CM | POA: Diagnosis not present

## 2022-07-03 DIAGNOSIS — S60011A Contusion of right thumb without damage to nail, initial encounter: Secondary | ICD-10-CM | POA: Diagnosis not present
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
## 2022-07-03 NOTE — ED Notes (Signed)
Pt appears to be sleeping, observe even RR and unlabored, NAD noted, side rails up x2 for safety, plan of care ongoing, pt's personal sitter from home at the bedside for safety, no further concerns as of present.

## 2022-07-03 NOTE — Progress Notes (Signed)
PT Cancellation Note  Patient Details Name: [name] MRN: [id number] DOB: 1951-07-25   Cancelled Treatment:    Reason Eval/Treat Not Completed: Patient declined, patient waiting on  lunch when OPT checked on pt. Will check back another time. Patient waiting on SNF bed. [place] Office [phone] Weekend [phone]    Claretha Cooper 07/03/2022, 4:39 PM

## 2022-07-03 NOTE — Progress Notes (Signed)
The pt's son called this CSW to inquire about bed offers. This CSW informed the pt that there are currently no bed offers but that a facility reached out to inform that they escalated the pt's referral to their administrator to review.   The pt's son, Birdena Crandall, wanted to know if he needs to extended the caregiver for a few more days. This CSW informed Birdena Crandall that that decision is made by the family as the hospital does not advise on whether to hire an additional caregiver outside of hospital staff. This CSW assured Sterling TOC will reach out with any updates on bed offers. TOC following.

## 2022-07-03 NOTE — ED Notes (Addendum)
Patient's BP 98/65 map 74 with 12.5mg  PO metoprolol. Notified Dr. Vanita Panda. He agrees with not giving the metoprolol at this time. Will continue to monitor.

## 2022-07-03 NOTE — Progress Notes (Addendum)
This CSW was contacted by Tammy, Admissions rep at [facility]. Tammy informed that the pt is in her co-pay days due to a previous stay in August. This CSW contacted the pt's niece, Joelene Millin, to verify. This CSW verified dates for Tammy, and Tammy informed that they are able to extend a bed offer and that co-pays would be $200/per day. This CSW contacted Sterling, the pt's son, to inform and Birdena Crandall shared that he would take care of the co-pay and wishes to move forward with the bed. This CSW contacted Tammy to inform her of acceptance. Left HIPAA Compliant voicemail. TOC following.   Addend @ 2:34 PM This CSW has attempted to contact Tammy 2x without success. Left HIPAA Compliant voicemail.

## 2022-07-03 NOTE — ED Notes (Signed)
Pt appears to be sleeping, observe even RR and unlabored, NAD noted, side rails up x2 for safety, no further concerns as of present, sitter at the bedisde

## 2022-07-03 NOTE — Progress Notes (Addendum)
This CSW has called the following facilities to follow up on the status of the pt's referral:  [facility] ([facility]): Left a voicemail for Secondary school teacher of Social Work. [facility]- Left a Advertising account executive for [employer] [facility] and sent a follow up text to [place] who is covering while Lexine Baton is out. [facility]: Gave pt name to Admissions rep. They will review and call me back.  [facility]- Followed up via text.   Added @ 11:20  [facility] and [facility] have declined the pt.

## 2022-07-03 NOTE — Social Work (Signed)
[facility] were called for a second time by CSW N/A left VM.

## 2022-07-03 NOTE — ED Notes (Signed)
Pt assisted to BR with sitter, one person assist with walker. Slow but steady gait observed.

## 2022-07-04 DIAGNOSIS — S60011A Contusion of right thumb without damage to nail, initial encounter: Secondary | ICD-10-CM | POA: Diagnosis not present

## 2022-07-04 DIAGNOSIS — R41841 Cognitive communication deficit: Secondary | ICD-10-CM | POA: Diagnosis not present

## 2022-07-04 DIAGNOSIS — M6281 Muscle weakness (generalized): Secondary | ICD-10-CM | POA: Diagnosis not present

## 2022-07-04 DIAGNOSIS — Z7401 Bed confinement status: Secondary | ICD-10-CM | POA: Diagnosis not present

## 2022-07-04 DIAGNOSIS — E119 Type 2 diabetes mellitus without complications: Secondary | ICD-10-CM | POA: Diagnosis not present

## 2022-07-04 DIAGNOSIS — E441 Mild protein-calorie malnutrition: Secondary | ICD-10-CM | POA: Diagnosis not present

## 2022-07-04 DIAGNOSIS — M25531 Pain in right wrist: Secondary | ICD-10-CM | POA: Diagnosis not present

## 2022-07-04 DIAGNOSIS — W19XXXA Unspecified fall, initial encounter: Secondary | ICD-10-CM | POA: Diagnosis not present

## 2022-07-04 DIAGNOSIS — E118 Type 2 diabetes mellitus with unspecified complications: Secondary | ICD-10-CM | POA: Diagnosis not present

## 2022-07-04 DIAGNOSIS — M48062 Spinal stenosis, lumbar region with neurogenic claudication: Secondary | ICD-10-CM | POA: Diagnosis not present

## 2022-07-04 DIAGNOSIS — Z7984 Long term (current) use of oral hypoglycemic drugs: Secondary | ICD-10-CM | POA: Diagnosis not present

## 2022-07-04 DIAGNOSIS — E46 Unspecified protein-calorie malnutrition: Secondary | ICD-10-CM | POA: Diagnosis not present

## 2022-07-04 DIAGNOSIS — G20C Parkinsonism, unspecified: Secondary | ICD-10-CM | POA: Diagnosis not present

## 2022-07-04 DIAGNOSIS — I2510 Atherosclerotic heart disease of native coronary artery without angina pectoris: Secondary | ICD-10-CM | POA: Diagnosis not present

## 2022-07-04 DIAGNOSIS — R2689 Other abnormalities of gait and mobility: Secondary | ICD-10-CM | POA: Diagnosis not present

## 2022-07-04 DIAGNOSIS — R2681 Unsteadiness on feet: Secondary | ICD-10-CM | POA: Diagnosis not present

## 2022-07-04 DIAGNOSIS — S60021A Contusion of right index finger without damage to nail, initial encounter: Secondary | ICD-10-CM | POA: Diagnosis not present

## 2022-07-04 DIAGNOSIS — F419 Anxiety disorder, unspecified: Secondary | ICD-10-CM | POA: Diagnosis not present

## 2022-07-04 DIAGNOSIS — M6259 Muscle wasting and atrophy, not elsewhere classified, multiple sites: Secondary | ICD-10-CM | POA: Diagnosis not present

## 2022-07-04 DIAGNOSIS — Z736 Limitation of activities due to disability: Secondary | ICD-10-CM | POA: Diagnosis not present

## 2022-07-04 DIAGNOSIS — S0990XA Unspecified injury of head, initial encounter: Secondary | ICD-10-CM | POA: Diagnosis not present

## 2022-07-04 DIAGNOSIS — I959 Hypotension, unspecified: Secondary | ICD-10-CM | POA: Diagnosis not present

## 2022-07-04 DIAGNOSIS — F32A Depression, unspecified: Secondary | ICD-10-CM | POA: Diagnosis not present

## 2022-07-04 DIAGNOSIS — I10 Essential (primary) hypertension: Secondary | ICD-10-CM | POA: Diagnosis not present

## 2022-07-04 DIAGNOSIS — R278 Other lack of coordination: Secondary | ICD-10-CM | POA: Diagnosis not present

## 2022-07-04 DIAGNOSIS — F411 Generalized anxiety disorder: Secondary | ICD-10-CM | POA: Diagnosis not present

## 2022-07-04 DIAGNOSIS — G219 Secondary parkinsonism, unspecified: Secondary | ICD-10-CM | POA: Diagnosis not present

## 2022-07-04 DIAGNOSIS — R300 Dysuria: Secondary | ICD-10-CM | POA: Diagnosis not present

## 2022-07-04 MED ORDER — ALBUTEROL SULFATE HFA 108 (90 BASE) MCG/ACT IN AERS: 1.0000 | INHALATION_SPRAY | Freq: Four times a day (QID) | RESPIRATORY_TRACT | 0 refills | Status: AC | PRN

## 2022-07-04 MED ORDER — ALBUTEROL SULFATE HFA 108 (90 BASE) MCG/ACT IN AERS
2.0000 | INHALATION_SPRAY | Freq: Once | RESPIRATORY_TRACT | Status: AC
  Administered 2022-07-04: 2 via RESPIRATORY_TRACT
  Filled 2022-07-04: qty 6.7

## 2022-07-04 NOTE — ED Notes (Signed)
Pt noted to be 84-88% on RA during vital checks. Pt denied any SOB, pain. Pt mentating at baseline. Dr. Maryan Rued notified. Pt encouraged to cough, deep breathe, and ambulate. Pt able to do so with walker and standby assistance. Pt improved to 94% on RA thereafter.

## 2022-07-04 NOTE — Discharge Instructions (Signed)
Patient can use inhaler as needed for hypoxia or shortness of breath.  Hold metoprolol if heart rate is less than 70 or blood pressure less than 697 systolic

## 2022-07-04 NOTE — Progress Notes (Signed)
CSW called nurse that is currently taking care of the Pt. PTAR had nine Patients before her, the Pt will leave shortly. TOC is signing of
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
## 2022-07-04 NOTE — Progress Notes (Signed)
This CSW attempted to contact Tammy, Admissions rep at [facility], twice and left voicemail.

## 2022-07-04 NOTE — Progress Notes (Addendum)
This CSW contacted Tammy with [facility] and provided the contact information for the pt's son to coordinate payment to the facility. This CSW has reached out to EDP and RN to confirm that the pt is medically stable for d/c today. This CSW has selected the pt in the hub. TOC following.   Addend @ 1:53 PM This CSW contacted Sterling to have him call Tammy at [facility]. Tammy provided room assignment and report #. Information given to Gwyndolyn Saxon, Therapist, sports. EDP, Dr.Plunkett to put pt up for d/c. Gwyndolyn Saxon, RN, to call transport for pt. This CSW emailed [facility] to inform of d/c plans.TOC signing off.
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
## 2022-07-04 NOTE — ED Notes (Signed)
[facility] here to pick up patient. They were unable to transport walker with them. Notified charge Therapist, sports. Called [name], son, at [phone] and left message. Spoke to her [name], at [phone] and she stated that she couldn't pick walker up and take to patient until Janet weekend. I told her that I would call Janet son and ask him. When I was unable to reach him, I called her back and call went straight to voicemail.

## 2022-07-04 NOTE — ED Notes (Signed)
Pt's oxygen saturation checked again. Pt noted to be 84-88% on RA. Pt encouraged to sit up and cough. Pt given IS and instructed on how to use it. Pt improved to 91%, but then again began having saturation 84-86%. Dr. Maryan Rued notified. See new orders. Advised not to start oxygen therapy at this time.

## 2022-07-05 DIAGNOSIS — M6281 Muscle weakness (generalized): Secondary | ICD-10-CM | POA: Diagnosis not present

## 2022-07-05 DIAGNOSIS — F32A Depression, unspecified: Secondary | ICD-10-CM | POA: Diagnosis not present

## 2022-07-05 DIAGNOSIS — E118 Type 2 diabetes mellitus with unspecified complications: Secondary | ICD-10-CM | POA: Diagnosis not present

## 2022-07-05 DIAGNOSIS — F419 Anxiety disorder, unspecified: Secondary | ICD-10-CM | POA: Diagnosis not present

## 2022-07-05 NOTE — ED Notes (Signed)
Newman Pies, RN at [facility], to give report and to let him know that [facility] was unable to bring patient's walker with them.

## 2022-07-09 ENCOUNTER — Other Ambulatory Visit: Payer: Self-pay | Admitting: *Deleted

## 2022-07-09 DIAGNOSIS — E46 Unspecified protein-calorie malnutrition: Secondary | ICD-10-CM | POA: Diagnosis not present

## 2022-07-09 DIAGNOSIS — E118 Type 2 diabetes mellitus with unspecified complications: Secondary | ICD-10-CM | POA: Diagnosis not present

## 2022-07-09 NOTE — Patient Outreach (Signed)
Mrs. Redondo resides in [facility]. She has utilized [facility] ACO [place] 3-day SNF waiver. Screening for potential [facility] care coordination services as benefit of insurance plan and PCP.   Secure communication sent to [facility], SNF to make aware writer is following for transition plans, barriers, and potential [facility] needs.   Will continue to follow while Mrs. Nesser resides in SNF.   Marthenia Rolling, MSN, RN,BSN [place] Acute Care Coordinator [phone] (Direct dial)

## 2022-07-12 DIAGNOSIS — I10 Essential (primary) hypertension: Secondary | ICD-10-CM | POA: Diagnosis not present

## 2022-07-12 DIAGNOSIS — E118 Type 2 diabetes mellitus with unspecified complications: Secondary | ICD-10-CM | POA: Diagnosis not present

## 2022-07-17 DIAGNOSIS — E118 Type 2 diabetes mellitus with unspecified complications: Secondary | ICD-10-CM | POA: Diagnosis not present

## 2022-07-17 DIAGNOSIS — M6281 Muscle weakness (generalized): Secondary | ICD-10-CM | POA: Diagnosis not present

## 2022-07-17 DIAGNOSIS — F419 Anxiety disorder, unspecified: Secondary | ICD-10-CM | POA: Diagnosis not present

## 2022-07-17 DIAGNOSIS — F32A Depression, unspecified: Secondary | ICD-10-CM | POA: Diagnosis not present

## 2022-07-18 ENCOUNTER — Other Ambulatory Visit: Payer: Self-pay | Admitting: *Deleted

## 2022-07-18 DIAGNOSIS — M6259 Muscle wasting and atrophy, not elsewhere classified, multiple sites: Secondary | ICD-10-CM | POA: Diagnosis not present

## 2022-07-18 DIAGNOSIS — F419 Anxiety disorder, unspecified: Secondary | ICD-10-CM | POA: Diagnosis not present

## 2022-07-18 DIAGNOSIS — I119 Hypertensive heart disease without heart failure: Secondary | ICD-10-CM | POA: Diagnosis not present

## 2022-07-18 DIAGNOSIS — M545 Low back pain, unspecified: Secondary | ICD-10-CM | POA: Diagnosis not present

## 2022-07-18 DIAGNOSIS — F32A Depression, unspecified: Secondary | ICD-10-CM | POA: Diagnosis not present

## 2022-07-18 DIAGNOSIS — I679 Cerebrovascular disease, unspecified: Secondary | ICD-10-CM | POA: Diagnosis not present

## 2022-07-18 DIAGNOSIS — E118 Type 2 diabetes mellitus with unspecified complications: Secondary | ICD-10-CM | POA: Diagnosis not present

## 2022-07-18 DIAGNOSIS — M6281 Muscle weakness (generalized): Secondary | ICD-10-CM | POA: Diagnosis not present

## 2022-07-18 DIAGNOSIS — E46 Unspecified protein-calorie malnutrition: Secondary | ICD-10-CM | POA: Diagnosis not present

## 2022-07-18 DIAGNOSIS — R2681 Unsteadiness on feet: Secondary | ICD-10-CM | POA: Diagnosis not present

## 2022-07-18 NOTE — Patient Outreach (Signed)
[facility] Post- Acute Care Coordinator follow up. Verified in [facility] [name] discharged from [facility] on 07/17/22.   Update received from [facility] SW indicating [name] transferred to facility in [place], [place].   No identifiable [facility] needs at this time.   Marthenia Rolling, MSN, RN,BSN [place] Acute Care Coordinator [phone] (Direct dial)

## 2022-07-19 DIAGNOSIS — R2681 Unsteadiness on feet: Secondary | ICD-10-CM | POA: Diagnosis not present

## 2022-07-19 DIAGNOSIS — M6259 Muscle wasting and atrophy, not elsewhere classified, multiple sites: Secondary | ICD-10-CM | POA: Diagnosis not present

## 2022-07-20 DIAGNOSIS — R2681 Unsteadiness on feet: Secondary | ICD-10-CM | POA: Diagnosis not present

## 2022-07-20 DIAGNOSIS — M6259 Muscle wasting and atrophy, not elsewhere classified, multiple sites: Secondary | ICD-10-CM | POA: Diagnosis not present

## 2022-07-20 DIAGNOSIS — I679 Cerebrovascular disease, unspecified: Secondary | ICD-10-CM | POA: Diagnosis not present

## 2022-07-20 DIAGNOSIS — F419 Anxiety disorder, unspecified: Secondary | ICD-10-CM | POA: Diagnosis not present

## 2022-07-20 DIAGNOSIS — M6281 Muscle weakness (generalized): Secondary | ICD-10-CM | POA: Diagnosis not present

## 2022-07-20 DIAGNOSIS — E118 Type 2 diabetes mellitus with unspecified complications: Secondary | ICD-10-CM | POA: Diagnosis not present

## 2022-07-20 DIAGNOSIS — I119 Hypertensive heart disease without heart failure: Secondary | ICD-10-CM | POA: Diagnosis not present

## 2022-07-20 DIAGNOSIS — F32A Depression, unspecified: Secondary | ICD-10-CM | POA: Diagnosis not present

## 2022-07-20 DIAGNOSIS — M545 Low back pain, unspecified: Secondary | ICD-10-CM | POA: Diagnosis not present

## 2022-07-20 DIAGNOSIS — E46 Unspecified protein-calorie malnutrition: Secondary | ICD-10-CM | POA: Diagnosis not present

## 2022-07-23 DIAGNOSIS — I119 Hypertensive heart disease without heart failure: Secondary | ICD-10-CM | POA: Diagnosis not present

## 2022-07-23 DIAGNOSIS — E46 Unspecified protein-calorie malnutrition: Secondary | ICD-10-CM | POA: Diagnosis not present

## 2022-07-23 DIAGNOSIS — R2681 Unsteadiness on feet: Secondary | ICD-10-CM | POA: Diagnosis not present

## 2022-07-23 DIAGNOSIS — E118 Type 2 diabetes mellitus with unspecified complications: Secondary | ICD-10-CM | POA: Diagnosis not present

## 2022-07-23 DIAGNOSIS — I679 Cerebrovascular disease, unspecified: Secondary | ICD-10-CM | POA: Diagnosis not present

## 2022-07-23 DIAGNOSIS — M545 Low back pain, unspecified: Secondary | ICD-10-CM | POA: Diagnosis not present

## 2022-07-23 DIAGNOSIS — F419 Anxiety disorder, unspecified: Secondary | ICD-10-CM | POA: Diagnosis not present

## 2022-07-23 DIAGNOSIS — M6259 Muscle wasting and atrophy, not elsewhere classified, multiple sites: Secondary | ICD-10-CM | POA: Diagnosis not present

## 2022-07-23 DIAGNOSIS — M6281 Muscle weakness (generalized): Secondary | ICD-10-CM | POA: Diagnosis not present

## 2022-07-23 DIAGNOSIS — F32A Depression, unspecified: Secondary | ICD-10-CM | POA: Diagnosis not present

## 2022-07-24 DIAGNOSIS — I679 Cerebrovascular disease, unspecified: Secondary | ICD-10-CM | POA: Diagnosis not present

## 2022-07-24 DIAGNOSIS — M545 Low back pain, unspecified: Secondary | ICD-10-CM | POA: Diagnosis not present

## 2022-07-24 DIAGNOSIS — R2681 Unsteadiness on feet: Secondary | ICD-10-CM | POA: Diagnosis not present

## 2022-07-24 DIAGNOSIS — E46 Unspecified protein-calorie malnutrition: Secondary | ICD-10-CM | POA: Diagnosis not present

## 2022-07-24 DIAGNOSIS — I119 Hypertensive heart disease without heart failure: Secondary | ICD-10-CM | POA: Diagnosis not present

## 2022-07-24 DIAGNOSIS — M6259 Muscle wasting and atrophy, not elsewhere classified, multiple sites: Secondary | ICD-10-CM | POA: Diagnosis not present

## 2022-07-24 DIAGNOSIS — M6281 Muscle weakness (generalized): Secondary | ICD-10-CM | POA: Diagnosis not present

## 2022-07-24 DIAGNOSIS — E118 Type 2 diabetes mellitus with unspecified complications: Secondary | ICD-10-CM | POA: Diagnosis not present

## 2022-07-24 DIAGNOSIS — F419 Anxiety disorder, unspecified: Secondary | ICD-10-CM | POA: Diagnosis not present

## 2022-07-24 DIAGNOSIS — F32A Depression, unspecified: Secondary | ICD-10-CM | POA: Diagnosis not present

## 2022-07-25 DIAGNOSIS — F5101 Primary insomnia: Secondary | ICD-10-CM | POA: Diagnosis not present

## 2022-07-25 DIAGNOSIS — M6259 Muscle wasting and atrophy, not elsewhere classified, multiple sites: Secondary | ICD-10-CM | POA: Diagnosis not present

## 2022-07-25 DIAGNOSIS — F332 Major depressive disorder, recurrent severe without psychotic features: Secondary | ICD-10-CM | POA: Diagnosis not present

## 2022-07-25 DIAGNOSIS — F411 Generalized anxiety disorder: Secondary | ICD-10-CM | POA: Diagnosis not present

## 2022-07-25 DIAGNOSIS — F4312 Post-traumatic stress disorder, chronic: Secondary | ICD-10-CM | POA: Diagnosis not present

## 2022-07-25 DIAGNOSIS — R2681 Unsteadiness on feet: Secondary | ICD-10-CM | POA: Diagnosis not present

## 2022-07-26 DIAGNOSIS — M6259 Muscle wasting and atrophy, not elsewhere classified, multiple sites: Secondary | ICD-10-CM | POA: Diagnosis not present

## 2022-07-26 DIAGNOSIS — R2681 Unsteadiness on feet: Secondary | ICD-10-CM | POA: Diagnosis not present

## 2022-07-27 DIAGNOSIS — E46 Unspecified protein-calorie malnutrition: Secondary | ICD-10-CM | POA: Diagnosis not present

## 2022-07-27 DIAGNOSIS — F419 Anxiety disorder, unspecified: Secondary | ICD-10-CM | POA: Diagnosis not present

## 2022-07-27 DIAGNOSIS — I679 Cerebrovascular disease, unspecified: Secondary | ICD-10-CM | POA: Diagnosis not present

## 2022-07-27 DIAGNOSIS — M6281 Muscle weakness (generalized): Secondary | ICD-10-CM | POA: Diagnosis not present

## 2022-07-27 DIAGNOSIS — F332 Major depressive disorder, recurrent severe without psychotic features: Secondary | ICD-10-CM | POA: Diagnosis not present

## 2022-07-27 DIAGNOSIS — F411 Generalized anxiety disorder: Secondary | ICD-10-CM | POA: Diagnosis not present

## 2022-07-27 DIAGNOSIS — M545 Low back pain, unspecified: Secondary | ICD-10-CM | POA: Diagnosis not present

## 2022-07-27 DIAGNOSIS — R2681 Unsteadiness on feet: Secondary | ICD-10-CM | POA: Diagnosis not present

## 2022-07-27 DIAGNOSIS — E118 Type 2 diabetes mellitus with unspecified complications: Secondary | ICD-10-CM | POA: Diagnosis not present

## 2022-07-27 DIAGNOSIS — M6259 Muscle wasting and atrophy, not elsewhere classified, multiple sites: Secondary | ICD-10-CM | POA: Diagnosis not present

## 2022-07-27 DIAGNOSIS — F32A Depression, unspecified: Secondary | ICD-10-CM | POA: Diagnosis not present

## 2022-07-27 DIAGNOSIS — I119 Hypertensive heart disease without heart failure: Secondary | ICD-10-CM | POA: Diagnosis not present

## 2022-07-28 DIAGNOSIS — R2681 Unsteadiness on feet: Secondary | ICD-10-CM | POA: Diagnosis not present

## 2022-07-28 DIAGNOSIS — M6259 Muscle wasting and atrophy, not elsewhere classified, multiple sites: Secondary | ICD-10-CM | POA: Diagnosis not present

## 2022-07-30 DIAGNOSIS — R2681 Unsteadiness on feet: Secondary | ICD-10-CM | POA: Diagnosis not present

## 2022-07-30 DIAGNOSIS — M6259 Muscle wasting and atrophy, not elsewhere classified, multiple sites: Secondary | ICD-10-CM | POA: Diagnosis not present

## 2022-07-31 DIAGNOSIS — E118 Type 2 diabetes mellitus with unspecified complications: Secondary | ICD-10-CM | POA: Diagnosis not present

## 2022-07-31 DIAGNOSIS — F419 Anxiety disorder, unspecified: Secondary | ICD-10-CM | POA: Diagnosis not present

## 2022-07-31 DIAGNOSIS — E46 Unspecified protein-calorie malnutrition: Secondary | ICD-10-CM | POA: Diagnosis not present

## 2022-07-31 DIAGNOSIS — I119 Hypertensive heart disease without heart failure: Secondary | ICD-10-CM | POA: Diagnosis not present

## 2022-07-31 DIAGNOSIS — M545 Low back pain, unspecified: Secondary | ICD-10-CM | POA: Diagnosis not present

## 2022-07-31 DIAGNOSIS — M6281 Muscle weakness (generalized): Secondary | ICD-10-CM | POA: Diagnosis not present

## 2022-07-31 DIAGNOSIS — M6259 Muscle wasting and atrophy, not elsewhere classified, multiple sites: Secondary | ICD-10-CM | POA: Diagnosis not present

## 2022-07-31 DIAGNOSIS — I679 Cerebrovascular disease, unspecified: Secondary | ICD-10-CM | POA: Diagnosis not present

## 2022-07-31 DIAGNOSIS — F32A Depression, unspecified: Secondary | ICD-10-CM | POA: Diagnosis not present

## 2022-07-31 DIAGNOSIS — R2681 Unsteadiness on feet: Secondary | ICD-10-CM | POA: Diagnosis not present

## 2022-08-01 DIAGNOSIS — F332 Major depressive disorder, recurrent severe without psychotic features: Secondary | ICD-10-CM | POA: Diagnosis not present

## 2022-08-01 DIAGNOSIS — M6259 Muscle wasting and atrophy, not elsewhere classified, multiple sites: Secondary | ICD-10-CM | POA: Diagnosis not present

## 2022-08-01 DIAGNOSIS — R2681 Unsteadiness on feet: Secondary | ICD-10-CM | POA: Diagnosis not present

## 2022-08-01 DIAGNOSIS — F5101 Primary insomnia: Secondary | ICD-10-CM | POA: Diagnosis not present

## 2022-08-01 DIAGNOSIS — F4312 Post-traumatic stress disorder, chronic: Secondary | ICD-10-CM | POA: Diagnosis not present

## 2022-08-01 DIAGNOSIS — F411 Generalized anxiety disorder: Secondary | ICD-10-CM | POA: Diagnosis not present

## 2022-08-02 DIAGNOSIS — B351 Tinea unguium: Secondary | ICD-10-CM | POA: Diagnosis not present

## 2022-08-02 DIAGNOSIS — M79675 Pain in left toe(s): Secondary | ICD-10-CM | POA: Diagnosis not present

## 2022-08-02 DIAGNOSIS — L602 Onychogryphosis: Secondary | ICD-10-CM | POA: Diagnosis not present

## 2022-08-02 DIAGNOSIS — I739 Peripheral vascular disease, unspecified: Secondary | ICD-10-CM | POA: Diagnosis not present

## 2022-08-02 DIAGNOSIS — R2681 Unsteadiness on feet: Secondary | ICD-10-CM | POA: Diagnosis not present

## 2022-08-02 DIAGNOSIS — M6259 Muscle wasting and atrophy, not elsewhere classified, multiple sites: Secondary | ICD-10-CM | POA: Diagnosis not present

## 2022-08-02 DIAGNOSIS — E1159 Type 2 diabetes mellitus with other circulatory complications: Secondary | ICD-10-CM | POA: Diagnosis not present

## 2022-08-02 DIAGNOSIS — L853 Xerosis cutis: Secondary | ICD-10-CM | POA: Diagnosis not present

## 2022-08-02 DIAGNOSIS — M79674 Pain in right toe(s): Secondary | ICD-10-CM | POA: Diagnosis not present

## 2022-08-03 DIAGNOSIS — M6259 Muscle wasting and atrophy, not elsewhere classified, multiple sites: Secondary | ICD-10-CM | POA: Diagnosis not present

## 2022-08-03 DIAGNOSIS — R2681 Unsteadiness on feet: Secondary | ICD-10-CM | POA: Diagnosis not present

## 2022-08-06 DIAGNOSIS — M6259 Muscle wasting and atrophy, not elsewhere classified, multiple sites: Secondary | ICD-10-CM | POA: Diagnosis not present

## 2022-08-06 DIAGNOSIS — R2681 Unsteadiness on feet: Secondary | ICD-10-CM | POA: Diagnosis not present

## 2022-08-07 DIAGNOSIS — R2681 Unsteadiness on feet: Secondary | ICD-10-CM | POA: Diagnosis not present

## 2022-08-07 DIAGNOSIS — M6259 Muscle wasting and atrophy, not elsewhere classified, multiple sites: Secondary | ICD-10-CM | POA: Diagnosis not present

## 2022-08-08 DIAGNOSIS — R2681 Unsteadiness on feet: Secondary | ICD-10-CM | POA: Diagnosis not present

## 2022-08-08 DIAGNOSIS — M6259 Muscle wasting and atrophy, not elsewhere classified, multiple sites: Secondary | ICD-10-CM | POA: Diagnosis not present

## 2022-08-09 DIAGNOSIS — M6259 Muscle wasting and atrophy, not elsewhere classified, multiple sites: Secondary | ICD-10-CM | POA: Diagnosis not present

## 2022-08-09 DIAGNOSIS — R2681 Unsteadiness on feet: Secondary | ICD-10-CM | POA: Diagnosis not present

## 2022-08-10 DIAGNOSIS — I119 Hypertensive heart disease without heart failure: Secondary | ICD-10-CM | POA: Diagnosis not present

## 2022-08-10 DIAGNOSIS — M6259 Muscle wasting and atrophy, not elsewhere classified, multiple sites: Secondary | ICD-10-CM | POA: Diagnosis not present

## 2022-08-10 DIAGNOSIS — R2681 Unsteadiness on feet: Secondary | ICD-10-CM | POA: Diagnosis not present

## 2022-08-10 DIAGNOSIS — E1159 Type 2 diabetes mellitus with other circulatory complications: Secondary | ICD-10-CM | POA: Diagnosis not present

## 2022-08-10 DIAGNOSIS — F332 Major depressive disorder, recurrent severe without psychotic features: Secondary | ICD-10-CM | POA: Diagnosis not present

## 2022-08-13 DIAGNOSIS — M6259 Muscle wasting and atrophy, not elsewhere classified, multiple sites: Secondary | ICD-10-CM | POA: Diagnosis not present

## 2022-08-13 DIAGNOSIS — R2681 Unsteadiness on feet: Secondary | ICD-10-CM | POA: Diagnosis not present

## 2022-08-14 DIAGNOSIS — R2681 Unsteadiness on feet: Secondary | ICD-10-CM | POA: Diagnosis not present

## 2022-08-14 DIAGNOSIS — M6259 Muscle wasting and atrophy, not elsewhere classified, multiple sites: Secondary | ICD-10-CM | POA: Diagnosis not present

## 2022-08-15 DIAGNOSIS — R2681 Unsteadiness on feet: Secondary | ICD-10-CM | POA: Diagnosis not present

## 2022-08-15 DIAGNOSIS — M6259 Muscle wasting and atrophy, not elsewhere classified, multiple sites: Secondary | ICD-10-CM | POA: Diagnosis not present

## 2022-08-16 DIAGNOSIS — R2681 Unsteadiness on feet: Secondary | ICD-10-CM | POA: Diagnosis not present

## 2022-08-16 DIAGNOSIS — M6259 Muscle wasting and atrophy, not elsewhere classified, multiple sites: Secondary | ICD-10-CM | POA: Diagnosis not present

## 2022-08-17 DIAGNOSIS — R2681 Unsteadiness on feet: Secondary | ICD-10-CM | POA: Diagnosis not present

## 2022-08-17 DIAGNOSIS — M6259 Muscle wasting and atrophy, not elsewhere classified, multiple sites: Secondary | ICD-10-CM | POA: Diagnosis not present

## 2022-08-17 DIAGNOSIS — F332 Major depressive disorder, recurrent severe without psychotic features: Secondary | ICD-10-CM | POA: Diagnosis not present

## 2022-08-17 DIAGNOSIS — F411 Generalized anxiety disorder: Secondary | ICD-10-CM | POA: Diagnosis not present

## 2022-08-20 DIAGNOSIS — R2681 Unsteadiness on feet: Secondary | ICD-10-CM | POA: Diagnosis not present

## 2022-08-20 DIAGNOSIS — M6259 Muscle wasting and atrophy, not elsewhere classified, multiple sites: Secondary | ICD-10-CM | POA: Diagnosis not present

## 2022-08-21 DIAGNOSIS — R2681 Unsteadiness on feet: Secondary | ICD-10-CM | POA: Diagnosis not present

## 2022-08-21 DIAGNOSIS — M6259 Muscle wasting and atrophy, not elsewhere classified, multiple sites: Secondary | ICD-10-CM | POA: Diagnosis not present

## 2022-08-22 DIAGNOSIS — R2681 Unsteadiness on feet: Secondary | ICD-10-CM | POA: Diagnosis not present

## 2022-08-22 DIAGNOSIS — M6259 Muscle wasting and atrophy, not elsewhere classified, multiple sites: Secondary | ICD-10-CM | POA: Diagnosis not present

## 2022-08-23 DIAGNOSIS — M6259 Muscle wasting and atrophy, not elsewhere classified, multiple sites: Secondary | ICD-10-CM | POA: Diagnosis not present

## 2022-08-23 DIAGNOSIS — R2681 Unsteadiness on feet: Secondary | ICD-10-CM | POA: Diagnosis not present

## 2022-08-24 DIAGNOSIS — R2681 Unsteadiness on feet: Secondary | ICD-10-CM | POA: Diagnosis not present

## 2022-08-24 DIAGNOSIS — M6259 Muscle wasting and atrophy, not elsewhere classified, multiple sites: Secondary | ICD-10-CM | POA: Diagnosis not present

## 2022-08-26 DIAGNOSIS — M6259 Muscle wasting and atrophy, not elsewhere classified, multiple sites: Secondary | ICD-10-CM | POA: Diagnosis not present

## 2022-08-26 DIAGNOSIS — R2681 Unsteadiness on feet: Secondary | ICD-10-CM | POA: Diagnosis not present

## 2022-08-27 DIAGNOSIS — M545 Low back pain, unspecified: Secondary | ICD-10-CM | POA: Diagnosis not present

## 2022-08-27 DIAGNOSIS — M6281 Muscle weakness (generalized): Secondary | ICD-10-CM | POA: Diagnosis not present

## 2022-08-27 DIAGNOSIS — R2681 Unsteadiness on feet: Secondary | ICD-10-CM | POA: Diagnosis not present

## 2022-08-27 DIAGNOSIS — M6259 Muscle wasting and atrophy, not elsewhere classified, multiple sites: Secondary | ICD-10-CM | POA: Diagnosis not present

## 2022-08-27 DIAGNOSIS — F332 Major depressive disorder, recurrent severe without psychotic features: Secondary | ICD-10-CM | POA: Diagnosis not present

## 2022-08-27 DIAGNOSIS — I679 Cerebrovascular disease, unspecified: Secondary | ICD-10-CM | POA: Diagnosis not present

## 2022-08-28 DIAGNOSIS — M6259 Muscle wasting and atrophy, not elsewhere classified, multiple sites: Secondary | ICD-10-CM | POA: Diagnosis not present

## 2022-08-28 DIAGNOSIS — R2681 Unsteadiness on feet: Secondary | ICD-10-CM | POA: Diagnosis not present

## 2022-08-29 DIAGNOSIS — R2681 Unsteadiness on feet: Secondary | ICD-10-CM | POA: Diagnosis not present

## 2022-08-29 DIAGNOSIS — M6259 Muscle wasting and atrophy, not elsewhere classified, multiple sites: Secondary | ICD-10-CM | POA: Diagnosis not present

## 2022-08-31 DIAGNOSIS — R2681 Unsteadiness on feet: Secondary | ICD-10-CM | POA: Diagnosis not present

## 2022-08-31 DIAGNOSIS — M6281 Muscle weakness (generalized): Secondary | ICD-10-CM | POA: Diagnosis not present

## 2022-08-31 DIAGNOSIS — M545 Low back pain, unspecified: Secondary | ICD-10-CM | POA: Diagnosis not present

## 2022-08-31 DIAGNOSIS — M6259 Muscle wasting and atrophy, not elsewhere classified, multiple sites: Secondary | ICD-10-CM | POA: Diagnosis not present

## 2022-08-31 DIAGNOSIS — F332 Major depressive disorder, recurrent severe without psychotic features: Secondary | ICD-10-CM | POA: Diagnosis not present

## 2022-09-03 DIAGNOSIS — M6259 Muscle wasting and atrophy, not elsewhere classified, multiple sites: Secondary | ICD-10-CM | POA: Diagnosis not present

## 2022-09-03 DIAGNOSIS — R2681 Unsteadiness on feet: Secondary | ICD-10-CM | POA: Diagnosis not present

## 2022-09-04 DIAGNOSIS — M6259 Muscle wasting and atrophy, not elsewhere classified, multiple sites: Secondary | ICD-10-CM | POA: Diagnosis not present

## 2022-09-04 DIAGNOSIS — R2681 Unsteadiness on feet: Secondary | ICD-10-CM | POA: Diagnosis not present

## 2022-09-05 DIAGNOSIS — R0981 Nasal congestion: Secondary | ICD-10-CM | POA: Diagnosis not present

## 2022-09-05 DIAGNOSIS — R051 Acute cough: Secondary | ICD-10-CM | POA: Diagnosis not present

## 2022-09-05 DIAGNOSIS — R2681 Unsteadiness on feet: Secondary | ICD-10-CM | POA: Diagnosis not present

## 2022-09-05 DIAGNOSIS — M6259 Muscle wasting and atrophy, not elsewhere classified, multiple sites: Secondary | ICD-10-CM | POA: Diagnosis not present

## 2022-09-05 DIAGNOSIS — M6281 Muscle weakness (generalized): Secondary | ICD-10-CM | POA: Diagnosis not present

## 2022-09-05 DIAGNOSIS — M545 Low back pain, unspecified: Secondary | ICD-10-CM | POA: Diagnosis not present

## 2022-09-06 DIAGNOSIS — F332 Major depressive disorder, recurrent severe without psychotic features: Secondary | ICD-10-CM | POA: Diagnosis not present

## 2022-09-06 DIAGNOSIS — R2681 Unsteadiness on feet: Secondary | ICD-10-CM | POA: Diagnosis not present

## 2022-09-06 DIAGNOSIS — E559 Vitamin D deficiency, unspecified: Secondary | ICD-10-CM | POA: Diagnosis not present

## 2022-09-06 DIAGNOSIS — D519 Vitamin B12 deficiency anemia, unspecified: Secondary | ICD-10-CM | POA: Diagnosis not present

## 2022-09-06 DIAGNOSIS — M6259 Muscle wasting and atrophy, not elsewhere classified, multiple sites: Secondary | ICD-10-CM | POA: Diagnosis not present

## 2022-09-06 DIAGNOSIS — E785 Hyperlipidemia, unspecified: Secondary | ICD-10-CM | POA: Diagnosis not present

## 2022-09-07 DIAGNOSIS — I679 Cerebrovascular disease, unspecified: Secondary | ICD-10-CM | POA: Diagnosis not present

## 2022-09-07 DIAGNOSIS — I2510 Atherosclerotic heart disease of native coronary artery without angina pectoris: Secondary | ICD-10-CM | POA: Diagnosis not present

## 2022-09-07 DIAGNOSIS — I10 Essential (primary) hypertension: Secondary | ICD-10-CM | POA: Diagnosis not present

## 2022-09-07 DIAGNOSIS — E119 Type 2 diabetes mellitus without complications: Secondary | ICD-10-CM | POA: Diagnosis not present

## 2022-09-20 DIAGNOSIS — F332 Major depressive disorder, recurrent severe without psychotic features: Secondary | ICD-10-CM | POA: Diagnosis not present

## 2022-09-20 DIAGNOSIS — F411 Generalized anxiety disorder: Secondary | ICD-10-CM | POA: Diagnosis not present

## 2022-09-26 DIAGNOSIS — I679 Cerebrovascular disease, unspecified: Secondary | ICD-10-CM | POA: Diagnosis not present

## 2022-09-26 DIAGNOSIS — I119 Hypertensive heart disease without heart failure: Secondary | ICD-10-CM | POA: Diagnosis not present

## 2022-09-26 DIAGNOSIS — E46 Unspecified protein-calorie malnutrition: Secondary | ICD-10-CM | POA: Diagnosis not present

## 2022-09-27 ENCOUNTER — Ambulatory Visit: Payer: Medicare Other | Admitting: Internal Medicine

## 2022-10-04 DIAGNOSIS — F332 Major depressive disorder, recurrent severe without psychotic features: Secondary | ICD-10-CM | POA: Diagnosis not present

## 2022-10-04 DIAGNOSIS — F411 Generalized anxiety disorder: Secondary | ICD-10-CM | POA: Diagnosis not present

## 2022-10-04 DIAGNOSIS — F4312 Post-traumatic stress disorder, chronic: Secondary | ICD-10-CM | POA: Diagnosis not present

## 2022-10-09 DIAGNOSIS — F332 Major depressive disorder, recurrent severe without psychotic features: Secondary | ICD-10-CM | POA: Diagnosis not present

## 2022-10-09 DIAGNOSIS — F411 Generalized anxiety disorder: Secondary | ICD-10-CM | POA: Diagnosis not present

## 2022-10-10 DIAGNOSIS — F4312 Post-traumatic stress disorder, chronic: Secondary | ICD-10-CM | POA: Diagnosis not present

## 2022-10-10 DIAGNOSIS — F332 Major depressive disorder, recurrent severe without psychotic features: Secondary | ICD-10-CM | POA: Diagnosis not present

## 2022-10-10 DIAGNOSIS — F411 Generalized anxiety disorder: Secondary | ICD-10-CM | POA: Diagnosis not present

## 2022-10-16 DIAGNOSIS — J986 Disorders of diaphragm: Secondary | ICD-10-CM | POA: Diagnosis not present

## 2022-10-16 DIAGNOSIS — R062 Wheezing: Secondary | ICD-10-CM | POA: Diagnosis not present

## 2022-10-16 DIAGNOSIS — F411 Generalized anxiety disorder: Secondary | ICD-10-CM | POA: Diagnosis not present

## 2022-10-16 DIAGNOSIS — F332 Major depressive disorder, recurrent severe without psychotic features: Secondary | ICD-10-CM | POA: Diagnosis not present

## 2022-10-16 DIAGNOSIS — R051 Acute cough: Secondary | ICD-10-CM | POA: Diagnosis not present

## 2022-10-17 DIAGNOSIS — F4312 Post-traumatic stress disorder, chronic: Secondary | ICD-10-CM | POA: Diagnosis not present

## 2022-10-17 DIAGNOSIS — F411 Generalized anxiety disorder: Secondary | ICD-10-CM | POA: Diagnosis not present

## 2022-10-17 DIAGNOSIS — F332 Major depressive disorder, recurrent severe without psychotic features: Secondary | ICD-10-CM | POA: Diagnosis not present

## 2022-10-19 DIAGNOSIS — R062 Wheezing: Secondary | ICD-10-CM | POA: Diagnosis not present

## 2022-10-19 DIAGNOSIS — R051 Acute cough: Secondary | ICD-10-CM | POA: Diagnosis not present

## 2022-10-24 DIAGNOSIS — F411 Generalized anxiety disorder: Secondary | ICD-10-CM | POA: Diagnosis not present

## 2022-10-24 DIAGNOSIS — F332 Major depressive disorder, recurrent severe without psychotic features: Secondary | ICD-10-CM | POA: Diagnosis not present

## 2022-10-24 DIAGNOSIS — F4312 Post-traumatic stress disorder, chronic: Secondary | ICD-10-CM | POA: Diagnosis not present

## 2022-10-25 DIAGNOSIS — E1159 Type 2 diabetes mellitus with other circulatory complications: Secondary | ICD-10-CM | POA: Diagnosis not present

## 2022-10-25 DIAGNOSIS — L602 Onychogryphosis: Secondary | ICD-10-CM | POA: Diagnosis not present

## 2022-10-25 DIAGNOSIS — I739 Peripheral vascular disease, unspecified: Secondary | ICD-10-CM | POA: Diagnosis not present

## 2022-10-25 DIAGNOSIS — B351 Tinea unguium: Secondary | ICD-10-CM | POA: Diagnosis not present

## 2022-10-29 DIAGNOSIS — E1159 Type 2 diabetes mellitus with other circulatory complications: Secondary | ICD-10-CM | POA: Diagnosis not present

## 2022-10-29 DIAGNOSIS — E46 Unspecified protein-calorie malnutrition: Secondary | ICD-10-CM | POA: Diagnosis not present

## 2022-10-29 DIAGNOSIS — I679 Cerebrovascular disease, unspecified: Secondary | ICD-10-CM | POA: Diagnosis not present

## 2022-10-31 DIAGNOSIS — F411 Generalized anxiety disorder: Secondary | ICD-10-CM | POA: Diagnosis not present

## 2022-10-31 DIAGNOSIS — F4312 Post-traumatic stress disorder, chronic: Secondary | ICD-10-CM | POA: Diagnosis not present

## 2022-10-31 DIAGNOSIS — F332 Major depressive disorder, recurrent severe without psychotic features: Secondary | ICD-10-CM | POA: Diagnosis not present

## 2022-11-06 DIAGNOSIS — D519 Vitamin B12 deficiency anemia, unspecified: Secondary | ICD-10-CM | POA: Diagnosis not present

## 2022-11-06 DIAGNOSIS — E559 Vitamin D deficiency, unspecified: Secondary | ICD-10-CM | POA: Diagnosis not present

## 2022-11-06 DIAGNOSIS — I119 Hypertensive heart disease without heart failure: Secondary | ICD-10-CM | POA: Diagnosis not present

## 2022-11-06 DIAGNOSIS — E785 Hyperlipidemia, unspecified: Secondary | ICD-10-CM | POA: Diagnosis not present

## 2022-11-07 DIAGNOSIS — F332 Major depressive disorder, recurrent severe without psychotic features: Secondary | ICD-10-CM | POA: Diagnosis not present

## 2022-11-07 DIAGNOSIS — F411 Generalized anxiety disorder: Secondary | ICD-10-CM | POA: Diagnosis not present

## 2022-11-07 DIAGNOSIS — F4312 Post-traumatic stress disorder, chronic: Secondary | ICD-10-CM | POA: Diagnosis not present

## 2022-11-13 DIAGNOSIS — F332 Major depressive disorder, recurrent severe without psychotic features: Secondary | ICD-10-CM | POA: Diagnosis not present

## 2022-11-13 DIAGNOSIS — F411 Generalized anxiety disorder: Secondary | ICD-10-CM | POA: Diagnosis not present

## 2022-11-14 DIAGNOSIS — F332 Major depressive disorder, recurrent severe without psychotic features: Secondary | ICD-10-CM | POA: Diagnosis not present

## 2022-11-14 DIAGNOSIS — F411 Generalized anxiety disorder: Secondary | ICD-10-CM | POA: Diagnosis not present

## 2022-11-14 DIAGNOSIS — F4312 Post-traumatic stress disorder, chronic: Secondary | ICD-10-CM | POA: Diagnosis not present

## 2022-11-21 DIAGNOSIS — F4312 Post-traumatic stress disorder, chronic: Secondary | ICD-10-CM | POA: Diagnosis not present

## 2022-11-21 DIAGNOSIS — F332 Major depressive disorder, recurrent severe without psychotic features: Secondary | ICD-10-CM | POA: Diagnosis not present

## 2022-11-21 DIAGNOSIS — F411 Generalized anxiety disorder: Secondary | ICD-10-CM | POA: Diagnosis not present

## 2022-11-30 DIAGNOSIS — D519 Vitamin B12 deficiency anemia, unspecified: Secondary | ICD-10-CM | POA: Diagnosis not present

## 2022-11-30 DIAGNOSIS — F32A Depression, unspecified: Secondary | ICD-10-CM | POA: Diagnosis not present

## 2022-11-30 DIAGNOSIS — E1159 Type 2 diabetes mellitus with other circulatory complications: Secondary | ICD-10-CM | POA: Diagnosis not present

## 2022-11-30 DIAGNOSIS — I10 Essential (primary) hypertension: Secondary | ICD-10-CM | POA: Diagnosis not present

## 2022-11-30 DIAGNOSIS — E46 Unspecified protein-calorie malnutrition: Secondary | ICD-10-CM | POA: Diagnosis not present

## 2022-11-30 DIAGNOSIS — E785 Hyperlipidemia, unspecified: Secondary | ICD-10-CM | POA: Diagnosis not present

## 2022-11-30 DIAGNOSIS — I679 Cerebrovascular disease, unspecified: Secondary | ICD-10-CM | POA: Diagnosis not present

## 2022-11-30 DIAGNOSIS — M545 Low back pain, unspecified: Secondary | ICD-10-CM | POA: Diagnosis not present

## 2022-11-30 DIAGNOSIS — F419 Anxiety disorder, unspecified: Secondary | ICD-10-CM | POA: Diagnosis not present

## 2022-12-07 DIAGNOSIS — I679 Cerebrovascular disease, unspecified: Secondary | ICD-10-CM | POA: Diagnosis not present

## 2022-12-07 DIAGNOSIS — E46 Unspecified protein-calorie malnutrition: Secondary | ICD-10-CM | POA: Diagnosis not present

## 2022-12-07 DIAGNOSIS — M6281 Muscle weakness (generalized): Secondary | ICD-10-CM | POA: Diagnosis not present

## 2022-12-07 DIAGNOSIS — W19XXXA Unspecified fall, initial encounter: Secondary | ICD-10-CM | POA: Diagnosis not present

## 2022-12-27 DIAGNOSIS — E7800 Pure hypercholesterolemia, unspecified: Secondary | ICD-10-CM | POA: Diagnosis not present

## 2022-12-27 DIAGNOSIS — G8929 Other chronic pain: Secondary | ICD-10-CM | POA: Diagnosis not present

## 2022-12-28 DIAGNOSIS — M545 Low back pain, unspecified: Secondary | ICD-10-CM | POA: Diagnosis not present

## 2022-12-28 DIAGNOSIS — R197 Diarrhea, unspecified: Secondary | ICD-10-CM | POA: Diagnosis not present

## 2022-12-28 DIAGNOSIS — M25562 Pain in left knee: Secondary | ICD-10-CM | POA: Diagnosis not present

## 2023-01-04 DIAGNOSIS — H9201 Otalgia, right ear: Secondary | ICD-10-CM | POA: Diagnosis not present

## 2023-01-04 DIAGNOSIS — S0991XA Unspecified injury of ear, initial encounter: Secondary | ICD-10-CM | POA: Diagnosis not present

## 2023-01-10 DIAGNOSIS — Z87891 Personal history of nicotine dependence: Secondary | ICD-10-CM | POA: Diagnosis not present

## 2023-01-10 DIAGNOSIS — R262 Difficulty in walking, not elsewhere classified: Secondary | ICD-10-CM | POA: Diagnosis not present

## 2023-01-10 DIAGNOSIS — R0902 Hypoxemia: Secondary | ICD-10-CM | POA: Diagnosis not present

## 2023-01-10 DIAGNOSIS — G20A1 Parkinson's disease without dyskinesia, without mention of fluctuations: Secondary | ICD-10-CM | POA: Diagnosis present

## 2023-01-10 DIAGNOSIS — A419 Sepsis, unspecified organism: Secondary | ICD-10-CM | POA: Diagnosis present

## 2023-01-10 DIAGNOSIS — J189 Pneumonia, unspecified organism: Secondary | ICD-10-CM | POA: Diagnosis not present

## 2023-01-10 DIAGNOSIS — R7989 Other specified abnormal findings of blood chemistry: Secondary | ICD-10-CM | POA: Diagnosis not present

## 2023-01-10 DIAGNOSIS — Z794 Long term (current) use of insulin: Secondary | ICD-10-CM | POA: Diagnosis not present

## 2023-01-10 DIAGNOSIS — J9601 Acute respiratory failure with hypoxia: Secondary | ICD-10-CM | POA: Diagnosis present

## 2023-01-10 DIAGNOSIS — D649 Anemia, unspecified: Secondary | ICD-10-CM | POA: Diagnosis present

## 2023-01-10 DIAGNOSIS — R059 Cough, unspecified: Secondary | ICD-10-CM | POA: Diagnosis not present

## 2023-01-10 DIAGNOSIS — R0602 Shortness of breath: Secondary | ICD-10-CM | POA: Diagnosis not present

## 2023-01-10 DIAGNOSIS — Z7409 Other reduced mobility: Secondary | ICD-10-CM | POA: Diagnosis present

## 2023-01-10 DIAGNOSIS — E785 Hyperlipidemia, unspecified: Secondary | ICD-10-CM | POA: Diagnosis present

## 2023-01-10 DIAGNOSIS — R531 Weakness: Secondary | ICD-10-CM | POA: Diagnosis not present

## 2023-01-10 DIAGNOSIS — E119 Type 2 diabetes mellitus without complications: Secondary | ICD-10-CM | POA: Diagnosis present

## 2023-01-10 DIAGNOSIS — R918 Other nonspecific abnormal finding of lung field: Secondary | ICD-10-CM | POA: Diagnosis not present

## 2023-01-10 DIAGNOSIS — M6281 Muscle weakness (generalized): Secondary | ICD-10-CM | POA: Diagnosis not present

## 2023-01-10 DIAGNOSIS — E871 Hypo-osmolality and hyponatremia: Secondary | ICD-10-CM | POA: Diagnosis present

## 2023-01-10 DIAGNOSIS — F32A Depression, unspecified: Secondary | ICD-10-CM | POA: Diagnosis present

## 2023-01-10 DIAGNOSIS — Z20822 Contact with and (suspected) exposure to covid-19: Secondary | ICD-10-CM | POA: Diagnosis not present

## 2023-01-10 DIAGNOSIS — I10 Essential (primary) hypertension: Secondary | ICD-10-CM | POA: Diagnosis present

## 2023-01-10 DIAGNOSIS — R0682 Tachypnea, not elsewhere classified: Secondary | ICD-10-CM | POA: Diagnosis not present

## 2023-01-10 DIAGNOSIS — R279 Unspecified lack of coordination: Secondary | ICD-10-CM | POA: Diagnosis not present

## 2023-01-11 DIAGNOSIS — J9601 Acute respiratory failure with hypoxia: Secondary | ICD-10-CM | POA: Diagnosis present

## 2023-01-11 DIAGNOSIS — J189 Pneumonia, unspecified organism: Secondary | ICD-10-CM | POA: Diagnosis present

## 2023-01-11 DIAGNOSIS — E871 Hypo-osmolality and hyponatremia: Secondary | ICD-10-CM | POA: Diagnosis present

## 2023-01-11 DIAGNOSIS — G20A1 Parkinson's disease without dyskinesia, without mention of fluctuations: Secondary | ICD-10-CM | POA: Diagnosis present

## 2023-01-11 DIAGNOSIS — E119 Type 2 diabetes mellitus without complications: Secondary | ICD-10-CM | POA: Diagnosis present

## 2023-01-11 DIAGNOSIS — Z87891 Personal history of nicotine dependence: Secondary | ICD-10-CM | POA: Diagnosis not present

## 2023-01-11 DIAGNOSIS — D649 Anemia, unspecified: Secondary | ICD-10-CM | POA: Diagnosis present

## 2023-01-11 DIAGNOSIS — Z7409 Other reduced mobility: Secondary | ICD-10-CM | POA: Diagnosis present

## 2023-01-11 DIAGNOSIS — I10 Essential (primary) hypertension: Secondary | ICD-10-CM | POA: Diagnosis present

## 2023-01-11 DIAGNOSIS — R918 Other nonspecific abnormal finding of lung field: Secondary | ICD-10-CM | POA: Diagnosis not present

## 2023-01-11 DIAGNOSIS — F32A Depression, unspecified: Secondary | ICD-10-CM | POA: Diagnosis present

## 2023-01-11 DIAGNOSIS — R7989 Other specified abnormal findings of blood chemistry: Secondary | ICD-10-CM | POA: Diagnosis present

## 2023-01-11 DIAGNOSIS — M6281 Muscle weakness (generalized): Secondary | ICD-10-CM | POA: Diagnosis not present

## 2023-01-11 DIAGNOSIS — R0602 Shortness of breath: Secondary | ICD-10-CM | POA: Diagnosis not present

## 2023-01-11 DIAGNOSIS — E785 Hyperlipidemia, unspecified: Secondary | ICD-10-CM | POA: Diagnosis present

## 2023-01-11 DIAGNOSIS — A419 Sepsis, unspecified organism: Secondary | ICD-10-CM | POA: Diagnosis present

## 2023-01-11 DIAGNOSIS — R279 Unspecified lack of coordination: Secondary | ICD-10-CM | POA: Diagnosis not present

## 2023-01-11 DIAGNOSIS — R262 Difficulty in walking, not elsewhere classified: Secondary | ICD-10-CM | POA: Diagnosis not present

## 2023-01-11 DIAGNOSIS — Z794 Long term (current) use of insulin: Secondary | ICD-10-CM | POA: Diagnosis not present

## 2023-01-12 DIAGNOSIS — J189 Pneumonia, unspecified organism: Secondary | ICD-10-CM | POA: Diagnosis not present

## 2023-01-13 DIAGNOSIS — J189 Pneumonia, unspecified organism: Secondary | ICD-10-CM | POA: Diagnosis not present

## 2023-01-14 DIAGNOSIS — J189 Pneumonia, unspecified organism: Secondary | ICD-10-CM | POA: Diagnosis not present

## 2023-01-15 DIAGNOSIS — J189 Pneumonia, unspecified organism: Secondary | ICD-10-CM | POA: Diagnosis not present

## 2023-01-16 DIAGNOSIS — J189 Pneumonia, unspecified organism: Secondary | ICD-10-CM | POA: Diagnosis not present

## 2023-01-17 DIAGNOSIS — E119 Type 2 diabetes mellitus without complications: Secondary | ICD-10-CM | POA: Diagnosis not present

## 2023-01-17 DIAGNOSIS — Z87891 Personal history of nicotine dependence: Secondary | ICD-10-CM | POA: Diagnosis not present

## 2023-01-17 DIAGNOSIS — F329 Major depressive disorder, single episode, unspecified: Secondary | ICD-10-CM | POA: Diagnosis not present

## 2023-01-17 DIAGNOSIS — E7800 Pure hypercholesterolemia, unspecified: Secondary | ICD-10-CM | POA: Diagnosis not present

## 2023-01-17 DIAGNOSIS — J17 Pneumonia in diseases classified elsewhere: Secondary | ICD-10-CM | POA: Diagnosis not present

## 2023-01-17 DIAGNOSIS — Z9181 History of falling: Secondary | ICD-10-CM | POA: Diagnosis not present

## 2023-01-17 DIAGNOSIS — M6281 Muscle weakness (generalized): Secondary | ICD-10-CM | POA: Diagnosis not present

## 2023-01-17 DIAGNOSIS — J189 Pneumonia, unspecified organism: Secondary | ICD-10-CM | POA: Diagnosis not present

## 2023-01-17 DIAGNOSIS — I10 Essential (primary) hypertension: Secondary | ICD-10-CM | POA: Diagnosis not present

## 2023-01-17 DIAGNOSIS — R262 Difficulty in walking, not elsewhere classified: Secondary | ICD-10-CM | POA: Diagnosis not present

## 2023-01-17 DIAGNOSIS — F32A Depression, unspecified: Secondary | ICD-10-CM | POA: Diagnosis not present

## 2023-01-17 DIAGNOSIS — S31829A Unspecified open wound of left buttock, initial encounter: Secondary | ICD-10-CM | POA: Diagnosis not present

## 2023-01-17 DIAGNOSIS — D519 Vitamin B12 deficiency anemia, unspecified: Secondary | ICD-10-CM | POA: Diagnosis not present

## 2023-01-17 DIAGNOSIS — E871 Hypo-osmolality and hyponatremia: Secondary | ICD-10-CM | POA: Diagnosis not present

## 2023-01-17 DIAGNOSIS — E785 Hyperlipidemia, unspecified: Secondary | ICD-10-CM | POA: Diagnosis not present

## 2023-01-17 DIAGNOSIS — J9601 Acute respiratory failure with hypoxia: Secondary | ICD-10-CM | POA: Diagnosis not present

## 2023-01-17 DIAGNOSIS — R279 Unspecified lack of coordination: Secondary | ICD-10-CM | POA: Diagnosis not present

## 2023-01-17 DIAGNOSIS — G20A1 Parkinson's disease without dyskinesia, without mention of fluctuations: Secondary | ICD-10-CM | POA: Diagnosis not present

## 2023-01-17 DIAGNOSIS — G8929 Other chronic pain: Secondary | ICD-10-CM | POA: Diagnosis not present

## 2023-01-17 DIAGNOSIS — R7989 Other specified abnormal findings of blood chemistry: Secondary | ICD-10-CM | POA: Diagnosis not present

## 2023-01-17 DIAGNOSIS — R339 Retention of urine, unspecified: Secondary | ICD-10-CM | POA: Diagnosis not present

## 2023-01-17 DIAGNOSIS — S31829D Unspecified open wound of left buttock, subsequent encounter: Secondary | ICD-10-CM | POA: Diagnosis not present

## 2023-01-17 DIAGNOSIS — J9620 Acute and chronic respiratory failure, unspecified whether with hypoxia or hypercapnia: Secondary | ICD-10-CM | POA: Diagnosis not present

## 2023-01-17 DIAGNOSIS — F419 Anxiety disorder, unspecified: Secondary | ICD-10-CM | POA: Diagnosis not present

## 2023-01-17 DIAGNOSIS — A419 Sepsis, unspecified organism: Secondary | ICD-10-CM | POA: Diagnosis not present

## 2023-01-21 DIAGNOSIS — J9620 Acute and chronic respiratory failure, unspecified whether with hypoxia or hypercapnia: Secondary | ICD-10-CM | POA: Diagnosis not present

## 2023-01-21 DIAGNOSIS — R339 Retention of urine, unspecified: Secondary | ICD-10-CM | POA: Diagnosis not present

## 2023-01-21 DIAGNOSIS — E871 Hypo-osmolality and hyponatremia: Secondary | ICD-10-CM | POA: Diagnosis not present

## 2023-01-21 DIAGNOSIS — G8929 Other chronic pain: Secondary | ICD-10-CM | POA: Diagnosis not present

## 2023-01-21 DIAGNOSIS — G20A1 Parkinson's disease without dyskinesia, without mention of fluctuations: Secondary | ICD-10-CM | POA: Diagnosis not present

## 2023-01-21 DIAGNOSIS — E7800 Pure hypercholesterolemia, unspecified: Secondary | ICD-10-CM | POA: Diagnosis not present

## 2023-01-21 DIAGNOSIS — I10 Essential (primary) hypertension: Secondary | ICD-10-CM | POA: Diagnosis not present

## 2023-01-21 DIAGNOSIS — A419 Sepsis, unspecified organism: Secondary | ICD-10-CM | POA: Diagnosis not present

## 2023-01-21 DIAGNOSIS — D519 Vitamin B12 deficiency anemia, unspecified: Secondary | ICD-10-CM | POA: Diagnosis not present

## 2023-01-21 DIAGNOSIS — E119 Type 2 diabetes mellitus without complications: Secondary | ICD-10-CM | POA: Diagnosis not present

## 2023-01-21 DIAGNOSIS — J17 Pneumonia in diseases classified elsewhere: Secondary | ICD-10-CM | POA: Diagnosis not present

## 2023-01-24 DIAGNOSIS — J189 Pneumonia, unspecified organism: Secondary | ICD-10-CM | POA: Diagnosis not present

## 2023-01-24 DIAGNOSIS — S31829A Unspecified open wound of left buttock, initial encounter: Secondary | ICD-10-CM | POA: Diagnosis not present

## 2023-01-24 DIAGNOSIS — M6281 Muscle weakness (generalized): Secondary | ICD-10-CM | POA: Diagnosis not present

## 2023-01-25 DIAGNOSIS — E119 Type 2 diabetes mellitus without complications: Secondary | ICD-10-CM | POA: Diagnosis not present

## 2023-01-25 DIAGNOSIS — J9620 Acute and chronic respiratory failure, unspecified whether with hypoxia or hypercapnia: Secondary | ICD-10-CM | POA: Diagnosis not present

## 2023-01-25 DIAGNOSIS — F329 Major depressive disorder, single episode, unspecified: Secondary | ICD-10-CM | POA: Diagnosis not present

## 2023-01-25 DIAGNOSIS — A419 Sepsis, unspecified organism: Secondary | ICD-10-CM | POA: Diagnosis not present

## 2023-01-25 DIAGNOSIS — I10 Essential (primary) hypertension: Secondary | ICD-10-CM | POA: Diagnosis not present

## 2023-01-25 DIAGNOSIS — J17 Pneumonia in diseases classified elsewhere: Secondary | ICD-10-CM | POA: Diagnosis not present

## 2023-01-25 DIAGNOSIS — E871 Hypo-osmolality and hyponatremia: Secondary | ICD-10-CM | POA: Diagnosis not present

## 2023-01-25 DIAGNOSIS — D519 Vitamin B12 deficiency anemia, unspecified: Secondary | ICD-10-CM | POA: Diagnosis not present

## 2023-01-25 DIAGNOSIS — E7800 Pure hypercholesterolemia, unspecified: Secondary | ICD-10-CM | POA: Diagnosis not present

## 2023-01-25 DIAGNOSIS — G8929 Other chronic pain: Secondary | ICD-10-CM | POA: Diagnosis not present

## 2023-01-25 DIAGNOSIS — G20A1 Parkinson's disease without dyskinesia, without mention of fluctuations: Secondary | ICD-10-CM | POA: Diagnosis not present

## 2023-01-28 DIAGNOSIS — J17 Pneumonia in diseases classified elsewhere: Secondary | ICD-10-CM | POA: Diagnosis not present

## 2023-01-28 DIAGNOSIS — M6281 Muscle weakness (generalized): Secondary | ICD-10-CM | POA: Diagnosis not present

## 2023-01-28 DIAGNOSIS — J9620 Acute and chronic respiratory failure, unspecified whether with hypoxia or hypercapnia: Secondary | ICD-10-CM | POA: Diagnosis not present

## 2023-01-31 DIAGNOSIS — J17 Pneumonia in diseases classified elsewhere: Secondary | ICD-10-CM | POA: Diagnosis not present

## 2023-01-31 DIAGNOSIS — S31829D Unspecified open wound of left buttock, subsequent encounter: Secondary | ICD-10-CM | POA: Diagnosis not present

## 2023-01-31 DIAGNOSIS — M6281 Muscle weakness (generalized): Secondary | ICD-10-CM | POA: Diagnosis not present

## 2023-02-04 DIAGNOSIS — E7800 Pure hypercholesterolemia, unspecified: Secondary | ICD-10-CM | POA: Diagnosis not present

## 2023-02-04 DIAGNOSIS — D519 Vitamin B12 deficiency anemia, unspecified: Secondary | ICD-10-CM | POA: Diagnosis not present

## 2023-02-04 DIAGNOSIS — J9620 Acute and chronic respiratory failure, unspecified whether with hypoxia or hypercapnia: Secondary | ICD-10-CM | POA: Diagnosis not present

## 2023-02-04 DIAGNOSIS — I10 Essential (primary) hypertension: Secondary | ICD-10-CM | POA: Diagnosis not present

## 2023-02-04 DIAGNOSIS — E871 Hypo-osmolality and hyponatremia: Secondary | ICD-10-CM | POA: Diagnosis not present

## 2023-02-04 DIAGNOSIS — E119 Type 2 diabetes mellitus without complications: Secondary | ICD-10-CM | POA: Diagnosis not present

## 2023-02-04 DIAGNOSIS — G8929 Other chronic pain: Secondary | ICD-10-CM | POA: Diagnosis not present

## 2023-02-04 DIAGNOSIS — J17 Pneumonia in diseases classified elsewhere: Secondary | ICD-10-CM | POA: Diagnosis not present

## 2023-02-04 DIAGNOSIS — M6281 Muscle weakness (generalized): Secondary | ICD-10-CM | POA: Diagnosis not present

## 2023-02-04 DIAGNOSIS — A419 Sepsis, unspecified organism: Secondary | ICD-10-CM | POA: Diagnosis not present

## 2023-02-04 DIAGNOSIS — G20A1 Parkinson's disease without dyskinesia, without mention of fluctuations: Secondary | ICD-10-CM | POA: Diagnosis not present

## 2023-02-07 DIAGNOSIS — I10 Essential (primary) hypertension: Secondary | ICD-10-CM | POA: Diagnosis not present

## 2023-02-07 DIAGNOSIS — Z9181 History of falling: Secondary | ICD-10-CM | POA: Diagnosis not present

## 2023-02-07 DIAGNOSIS — F419 Anxiety disorder, unspecified: Secondary | ICD-10-CM | POA: Diagnosis not present

## 2023-02-07 DIAGNOSIS — F32A Depression, unspecified: Secondary | ICD-10-CM | POA: Diagnosis not present

## 2023-02-07 DIAGNOSIS — E119 Type 2 diabetes mellitus without complications: Secondary | ICD-10-CM | POA: Diagnosis not present

## 2023-02-07 DIAGNOSIS — E871 Hypo-osmolality and hyponatremia: Secondary | ICD-10-CM | POA: Diagnosis not present

## 2023-02-07 DIAGNOSIS — Z87891 Personal history of nicotine dependence: Secondary | ICD-10-CM | POA: Diagnosis not present

## 2023-02-07 DIAGNOSIS — G20A1 Parkinson's disease without dyskinesia, without mention of fluctuations: Secondary | ICD-10-CM | POA: Diagnosis not present

## 2023-02-11 DIAGNOSIS — I10 Essential (primary) hypertension: Secondary | ICD-10-CM | POA: Diagnosis not present

## 2023-02-11 DIAGNOSIS — G20A1 Parkinson's disease without dyskinesia, without mention of fluctuations: Secondary | ICD-10-CM | POA: Diagnosis not present

## 2023-02-11 DIAGNOSIS — F32A Depression, unspecified: Secondary | ICD-10-CM | POA: Diagnosis not present

## 2023-02-11 DIAGNOSIS — F419 Anxiety disorder, unspecified: Secondary | ICD-10-CM | POA: Diagnosis not present

## 2023-02-11 DIAGNOSIS — E119 Type 2 diabetes mellitus without complications: Secondary | ICD-10-CM | POA: Diagnosis not present

## 2023-02-11 DIAGNOSIS — E871 Hypo-osmolality and hyponatremia: Secondary | ICD-10-CM | POA: Diagnosis not present

## 2023-02-13 DIAGNOSIS — G20A1 Parkinson's disease without dyskinesia, without mention of fluctuations: Secondary | ICD-10-CM | POA: Diagnosis not present

## 2023-02-13 DIAGNOSIS — F32A Depression, unspecified: Secondary | ICD-10-CM | POA: Diagnosis not present

## 2023-02-13 DIAGNOSIS — F419 Anxiety disorder, unspecified: Secondary | ICD-10-CM | POA: Diagnosis not present

## 2023-02-13 DIAGNOSIS — E871 Hypo-osmolality and hyponatremia: Secondary | ICD-10-CM | POA: Diagnosis not present

## 2023-02-13 DIAGNOSIS — I10 Essential (primary) hypertension: Secondary | ICD-10-CM | POA: Diagnosis not present

## 2023-02-13 DIAGNOSIS — E119 Type 2 diabetes mellitus without complications: Secondary | ICD-10-CM | POA: Diagnosis not present

## 2023-02-14 DIAGNOSIS — E119 Type 2 diabetes mellitus without complications: Secondary | ICD-10-CM | POA: Diagnosis not present

## 2023-02-14 DIAGNOSIS — F419 Anxiety disorder, unspecified: Secondary | ICD-10-CM | POA: Diagnosis not present

## 2023-02-14 DIAGNOSIS — I10 Essential (primary) hypertension: Secondary | ICD-10-CM | POA: Diagnosis not present

## 2023-02-14 DIAGNOSIS — F32A Depression, unspecified: Secondary | ICD-10-CM | POA: Diagnosis not present

## 2023-02-14 DIAGNOSIS — G20A1 Parkinson's disease without dyskinesia, without mention of fluctuations: Secondary | ICD-10-CM | POA: Diagnosis not present

## 2023-02-14 DIAGNOSIS — E871 Hypo-osmolality and hyponatremia: Secondary | ICD-10-CM | POA: Diagnosis not present

## 2023-02-15 DIAGNOSIS — D519 Vitamin B12 deficiency anemia, unspecified: Secondary | ICD-10-CM | POA: Diagnosis not present

## 2023-02-15 DIAGNOSIS — E785 Hyperlipidemia, unspecified: Secondary | ICD-10-CM | POA: Diagnosis not present

## 2023-02-15 DIAGNOSIS — R197 Diarrhea, unspecified: Secondary | ICD-10-CM | POA: Diagnosis not present

## 2023-02-15 DIAGNOSIS — I10 Essential (primary) hypertension: Secondary | ICD-10-CM | POA: Diagnosis not present

## 2023-02-15 DIAGNOSIS — M25562 Pain in left knee: Secondary | ICD-10-CM | POA: Diagnosis not present

## 2023-02-15 DIAGNOSIS — G20A1 Parkinson's disease without dyskinesia, without mention of fluctuations: Secondary | ICD-10-CM | POA: Diagnosis not present

## 2023-02-15 DIAGNOSIS — M545 Low back pain, unspecified: Secondary | ICD-10-CM | POA: Diagnosis not present

## 2023-02-15 DIAGNOSIS — F419 Anxiety disorder, unspecified: Secondary | ICD-10-CM | POA: Diagnosis not present

## 2023-02-15 DIAGNOSIS — I679 Cerebrovascular disease, unspecified: Secondary | ICD-10-CM | POA: Diagnosis not present

## 2023-02-15 DIAGNOSIS — F32A Depression, unspecified: Secondary | ICD-10-CM | POA: Diagnosis not present

## 2023-02-15 DIAGNOSIS — E1159 Type 2 diabetes mellitus with other circulatory complications: Secondary | ICD-10-CM | POA: Diagnosis not present

## 2023-02-18 DIAGNOSIS — E871 Hypo-osmolality and hyponatremia: Secondary | ICD-10-CM | POA: Diagnosis not present

## 2023-02-18 DIAGNOSIS — F419 Anxiety disorder, unspecified: Secondary | ICD-10-CM | POA: Diagnosis not present

## 2023-02-18 DIAGNOSIS — G20A1 Parkinson's disease without dyskinesia, without mention of fluctuations: Secondary | ICD-10-CM | POA: Diagnosis not present

## 2023-02-18 DIAGNOSIS — E119 Type 2 diabetes mellitus without complications: Secondary | ICD-10-CM | POA: Diagnosis not present

## 2023-02-18 DIAGNOSIS — F32A Depression, unspecified: Secondary | ICD-10-CM | POA: Diagnosis not present

## 2023-02-18 DIAGNOSIS — I10 Essential (primary) hypertension: Secondary | ICD-10-CM | POA: Diagnosis not present

## 2023-02-19 DIAGNOSIS — R0602 Shortness of breath: Secondary | ICD-10-CM | POA: Diagnosis not present

## 2023-02-19 DIAGNOSIS — F419 Anxiety disorder, unspecified: Secondary | ICD-10-CM | POA: Diagnosis not present

## 2023-02-19 DIAGNOSIS — R918 Other nonspecific abnormal finding of lung field: Secondary | ICD-10-CM | POA: Diagnosis not present

## 2023-02-19 DIAGNOSIS — J189 Pneumonia, unspecified organism: Secondary | ICD-10-CM | POA: Diagnosis not present

## 2023-02-19 DIAGNOSIS — E871 Hypo-osmolality and hyponatremia: Secondary | ICD-10-CM | POA: Diagnosis not present

## 2023-02-19 DIAGNOSIS — E119 Type 2 diabetes mellitus without complications: Secondary | ICD-10-CM | POA: Diagnosis not present

## 2023-02-19 DIAGNOSIS — I10 Essential (primary) hypertension: Secondary | ICD-10-CM | POA: Diagnosis not present

## 2023-02-19 DIAGNOSIS — G20A1 Parkinson's disease without dyskinesia, without mention of fluctuations: Secondary | ICD-10-CM | POA: Diagnosis not present

## 2023-02-19 DIAGNOSIS — F32A Depression, unspecified: Secondary | ICD-10-CM | POA: Diagnosis not present

## 2023-02-19 DIAGNOSIS — R942 Abnormal results of pulmonary function studies: Secondary | ICD-10-CM | POA: Diagnosis not present

## 2023-02-20 DIAGNOSIS — F32A Depression, unspecified: Secondary | ICD-10-CM | POA: Diagnosis not present

## 2023-02-20 DIAGNOSIS — I10 Essential (primary) hypertension: Secondary | ICD-10-CM | POA: Diagnosis not present

## 2023-02-20 DIAGNOSIS — E119 Type 2 diabetes mellitus without complications: Secondary | ICD-10-CM | POA: Diagnosis not present

## 2023-02-20 DIAGNOSIS — E871 Hypo-osmolality and hyponatremia: Secondary | ICD-10-CM | POA: Diagnosis not present

## 2023-02-20 DIAGNOSIS — F419 Anxiety disorder, unspecified: Secondary | ICD-10-CM | POA: Diagnosis not present

## 2023-02-20 DIAGNOSIS — G20A1 Parkinson's disease without dyskinesia, without mention of fluctuations: Secondary | ICD-10-CM | POA: Diagnosis not present

## 2023-02-22 DIAGNOSIS — F32A Depression, unspecified: Secondary | ICD-10-CM | POA: Diagnosis not present

## 2023-02-22 DIAGNOSIS — I10 Essential (primary) hypertension: Secondary | ICD-10-CM | POA: Diagnosis not present

## 2023-02-22 DIAGNOSIS — F419 Anxiety disorder, unspecified: Secondary | ICD-10-CM | POA: Diagnosis not present

## 2023-02-22 DIAGNOSIS — G20A1 Parkinson's disease without dyskinesia, without mention of fluctuations: Secondary | ICD-10-CM | POA: Diagnosis not present

## 2023-02-22 DIAGNOSIS — E871 Hypo-osmolality and hyponatremia: Secondary | ICD-10-CM | POA: Diagnosis not present

## 2023-02-22 DIAGNOSIS — E119 Type 2 diabetes mellitus without complications: Secondary | ICD-10-CM | POA: Diagnosis not present

## 2023-02-26 DIAGNOSIS — F419 Anxiety disorder, unspecified: Secondary | ICD-10-CM | POA: Diagnosis not present

## 2023-02-26 DIAGNOSIS — I10 Essential (primary) hypertension: Secondary | ICD-10-CM | POA: Diagnosis not present

## 2023-02-26 DIAGNOSIS — E119 Type 2 diabetes mellitus without complications: Secondary | ICD-10-CM | POA: Diagnosis not present

## 2023-02-26 DIAGNOSIS — E871 Hypo-osmolality and hyponatremia: Secondary | ICD-10-CM | POA: Diagnosis not present

## 2023-02-26 DIAGNOSIS — G20A1 Parkinson's disease without dyskinesia, without mention of fluctuations: Secondary | ICD-10-CM | POA: Diagnosis not present

## 2023-02-26 DIAGNOSIS — F32A Depression, unspecified: Secondary | ICD-10-CM | POA: Diagnosis not present

## 2023-02-28 DIAGNOSIS — I10 Essential (primary) hypertension: Secondary | ICD-10-CM | POA: Diagnosis not present

## 2023-02-28 DIAGNOSIS — F419 Anxiety disorder, unspecified: Secondary | ICD-10-CM | POA: Diagnosis not present

## 2023-02-28 DIAGNOSIS — G20A1 Parkinson's disease without dyskinesia, without mention of fluctuations: Secondary | ICD-10-CM | POA: Diagnosis not present

## 2023-02-28 DIAGNOSIS — E871 Hypo-osmolality and hyponatremia: Secondary | ICD-10-CM | POA: Diagnosis not present

## 2023-02-28 DIAGNOSIS — F32A Depression, unspecified: Secondary | ICD-10-CM | POA: Diagnosis not present

## 2023-02-28 DIAGNOSIS — E119 Type 2 diabetes mellitus without complications: Secondary | ICD-10-CM | POA: Diagnosis not present

## 2023-03-01 DIAGNOSIS — W19XXXD Unspecified fall, subsequent encounter: Secondary | ICD-10-CM | POA: Diagnosis not present

## 2023-03-01 DIAGNOSIS — F332 Major depressive disorder, recurrent severe without psychotic features: Secondary | ICD-10-CM | POA: Diagnosis not present

## 2023-03-01 DIAGNOSIS — F411 Generalized anxiety disorder: Secondary | ICD-10-CM | POA: Diagnosis not present

## 2023-03-01 DIAGNOSIS — Z8659 Personal history of other mental and behavioral disorders: Secondary | ICD-10-CM | POA: Diagnosis not present

## 2023-03-01 DIAGNOSIS — G8929 Other chronic pain: Secondary | ICD-10-CM | POA: Diagnosis not present

## 2023-03-01 DIAGNOSIS — E7800 Pure hypercholesterolemia, unspecified: Secondary | ICD-10-CM | POA: Diagnosis not present

## 2023-03-01 DIAGNOSIS — H539 Unspecified visual disturbance: Secondary | ICD-10-CM | POA: Diagnosis not present

## 2023-03-05 DIAGNOSIS — F419 Anxiety disorder, unspecified: Secondary | ICD-10-CM | POA: Diagnosis not present

## 2023-03-05 DIAGNOSIS — E119 Type 2 diabetes mellitus without complications: Secondary | ICD-10-CM | POA: Diagnosis not present

## 2023-03-05 DIAGNOSIS — E871 Hypo-osmolality and hyponatremia: Secondary | ICD-10-CM | POA: Diagnosis not present

## 2023-03-05 DIAGNOSIS — G20A1 Parkinson's disease without dyskinesia, without mention of fluctuations: Secondary | ICD-10-CM | POA: Diagnosis not present

## 2023-03-05 DIAGNOSIS — I10 Essential (primary) hypertension: Secondary | ICD-10-CM | POA: Diagnosis not present

## 2023-03-05 DIAGNOSIS — F32A Depression, unspecified: Secondary | ICD-10-CM | POA: Diagnosis not present

## 2023-03-07 DIAGNOSIS — G20A1 Parkinson's disease without dyskinesia, without mention of fluctuations: Secondary | ICD-10-CM | POA: Diagnosis not present

## 2023-03-07 DIAGNOSIS — F32A Depression, unspecified: Secondary | ICD-10-CM | POA: Diagnosis not present

## 2023-03-07 DIAGNOSIS — E871 Hypo-osmolality and hyponatremia: Secondary | ICD-10-CM | POA: Diagnosis not present

## 2023-03-07 DIAGNOSIS — F419 Anxiety disorder, unspecified: Secondary | ICD-10-CM | POA: Diagnosis not present

## 2023-03-07 DIAGNOSIS — E119 Type 2 diabetes mellitus without complications: Secondary | ICD-10-CM | POA: Diagnosis not present

## 2023-03-07 DIAGNOSIS — I10 Essential (primary) hypertension: Secondary | ICD-10-CM | POA: Diagnosis not present

## 2023-03-09 DIAGNOSIS — F419 Anxiety disorder, unspecified: Secondary | ICD-10-CM | POA: Diagnosis not present

## 2023-03-09 DIAGNOSIS — F32A Depression, unspecified: Secondary | ICD-10-CM | POA: Diagnosis not present

## 2023-03-09 DIAGNOSIS — E119 Type 2 diabetes mellitus without complications: Secondary | ICD-10-CM | POA: Diagnosis not present

## 2023-03-09 DIAGNOSIS — Z87891 Personal history of nicotine dependence: Secondary | ICD-10-CM | POA: Diagnosis not present

## 2023-03-09 DIAGNOSIS — G20A1 Parkinson's disease without dyskinesia, without mention of fluctuations: Secondary | ICD-10-CM | POA: Diagnosis not present

## 2023-03-09 DIAGNOSIS — Z9181 History of falling: Secondary | ICD-10-CM | POA: Diagnosis not present

## 2023-03-09 DIAGNOSIS — I10 Essential (primary) hypertension: Secondary | ICD-10-CM | POA: Diagnosis not present

## 2023-03-09 DIAGNOSIS — E871 Hypo-osmolality and hyponatremia: Secondary | ICD-10-CM | POA: Diagnosis not present

## 2023-03-14 DIAGNOSIS — E871 Hypo-osmolality and hyponatremia: Secondary | ICD-10-CM | POA: Diagnosis not present

## 2023-03-14 DIAGNOSIS — G20A1 Parkinson's disease without dyskinesia, without mention of fluctuations: Secondary | ICD-10-CM | POA: Diagnosis not present

## 2023-03-14 DIAGNOSIS — E119 Type 2 diabetes mellitus without complications: Secondary | ICD-10-CM | POA: Diagnosis not present

## 2023-03-14 DIAGNOSIS — F32A Depression, unspecified: Secondary | ICD-10-CM | POA: Diagnosis not present

## 2023-03-14 DIAGNOSIS — F419 Anxiety disorder, unspecified: Secondary | ICD-10-CM | POA: Diagnosis not present

## 2023-03-14 DIAGNOSIS — I10 Essential (primary) hypertension: Secondary | ICD-10-CM | POA: Diagnosis not present

## 2023-03-17 DIAGNOSIS — I959 Hypotension, unspecified: Secondary | ICD-10-CM | POA: Diagnosis not present

## 2023-03-17 DIAGNOSIS — Z23 Encounter for immunization: Secondary | ICD-10-CM | POA: Diagnosis not present

## 2023-03-17 DIAGNOSIS — R519 Headache, unspecified: Secondary | ICD-10-CM | POA: Diagnosis not present

## 2023-03-17 DIAGNOSIS — M79645 Pain in left finger(s): Secondary | ICD-10-CM | POA: Diagnosis not present

## 2023-03-17 DIAGNOSIS — W06XXXA Fall from bed, initial encounter: Secondary | ICD-10-CM | POA: Diagnosis not present

## 2023-03-17 DIAGNOSIS — S61412A Laceration without foreign body of left hand, initial encounter: Secondary | ICD-10-CM | POA: Diagnosis not present

## 2023-03-17 DIAGNOSIS — E119 Type 2 diabetes mellitus without complications: Secondary | ICD-10-CM | POA: Diagnosis not present

## 2023-03-17 DIAGNOSIS — Z79899 Other long term (current) drug therapy: Secondary | ICD-10-CM | POA: Diagnosis not present

## 2023-03-17 DIAGNOSIS — Z7984 Long term (current) use of oral hypoglycemic drugs: Secondary | ICD-10-CM | POA: Diagnosis not present

## 2023-03-17 DIAGNOSIS — R079 Chest pain, unspecified: Secondary | ICD-10-CM | POA: Diagnosis not present

## 2023-03-17 DIAGNOSIS — I10 Essential (primary) hypertension: Secondary | ICD-10-CM | POA: Diagnosis not present

## 2023-03-17 DIAGNOSIS — S299XXA Unspecified injury of thorax, initial encounter: Secondary | ICD-10-CM | POA: Diagnosis not present

## 2023-03-17 DIAGNOSIS — M542 Cervicalgia: Secondary | ICD-10-CM | POA: Diagnosis not present

## 2023-03-17 DIAGNOSIS — S62617A Displaced fracture of proximal phalanx of left little finger, initial encounter for closed fracture: Secondary | ICD-10-CM | POA: Diagnosis not present

## 2023-03-17 DIAGNOSIS — S199XXA Unspecified injury of neck, initial encounter: Secondary | ICD-10-CM | POA: Diagnosis not present

## 2023-03-17 DIAGNOSIS — Z888 Allergy status to other drugs, medicaments and biological substances status: Secondary | ICD-10-CM | POA: Diagnosis not present

## 2023-03-17 DIAGNOSIS — M25512 Pain in left shoulder: Secondary | ICD-10-CM | POA: Diagnosis not present

## 2023-03-17 DIAGNOSIS — S3993XA Unspecified injury of pelvis, initial encounter: Secondary | ICD-10-CM | POA: Diagnosis not present

## 2023-03-17 DIAGNOSIS — R109 Unspecified abdominal pain: Secondary | ICD-10-CM | POA: Diagnosis not present

## 2023-03-17 DIAGNOSIS — S3991XA Unspecified injury of abdomen, initial encounter: Secondary | ICD-10-CM | POA: Diagnosis not present

## 2023-03-17 DIAGNOSIS — S62611A Displaced fracture of proximal phalanx of left index finger, initial encounter for closed fracture: Secondary | ICD-10-CM | POA: Diagnosis not present

## 2023-03-21 DIAGNOSIS — E871 Hypo-osmolality and hyponatremia: Secondary | ICD-10-CM | POA: Diagnosis not present

## 2023-03-21 DIAGNOSIS — F419 Anxiety disorder, unspecified: Secondary | ICD-10-CM | POA: Diagnosis not present

## 2023-03-21 DIAGNOSIS — E119 Type 2 diabetes mellitus without complications: Secondary | ICD-10-CM | POA: Diagnosis not present

## 2023-03-21 DIAGNOSIS — G20A1 Parkinson's disease without dyskinesia, without mention of fluctuations: Secondary | ICD-10-CM | POA: Diagnosis not present

## 2023-03-21 DIAGNOSIS — E1159 Type 2 diabetes mellitus with other circulatory complications: Secondary | ICD-10-CM | POA: Diagnosis not present

## 2023-03-21 DIAGNOSIS — I10 Essential (primary) hypertension: Secondary | ICD-10-CM | POA: Diagnosis not present

## 2023-03-21 DIAGNOSIS — I119 Hypertensive heart disease without heart failure: Secondary | ICD-10-CM | POA: Diagnosis not present

## 2023-03-21 DIAGNOSIS — F32A Depression, unspecified: Secondary | ICD-10-CM | POA: Diagnosis not present

## 2023-03-21 DIAGNOSIS — E1151 Type 2 diabetes mellitus with diabetic peripheral angiopathy without gangrene: Secondary | ICD-10-CM | POA: Diagnosis not present

## 2023-03-22 DIAGNOSIS — M6281 Muscle weakness (generalized): Secondary | ICD-10-CM | POA: Diagnosis not present

## 2023-03-22 DIAGNOSIS — G20A1 Parkinson's disease without dyskinesia, without mention of fluctuations: Secondary | ICD-10-CM | POA: Diagnosis not present

## 2023-03-22 DIAGNOSIS — S62603A Fracture of unspecified phalanx of left middle finger, initial encounter for closed fracture: Secondary | ICD-10-CM | POA: Diagnosis not present

## 2023-03-23 DIAGNOSIS — F3341 Major depressive disorder, recurrent, in partial remission: Secondary | ICD-10-CM | POA: Diagnosis not present

## 2023-03-23 DIAGNOSIS — E1151 Type 2 diabetes mellitus with diabetic peripheral angiopathy without gangrene: Secondary | ICD-10-CM | POA: Diagnosis not present

## 2023-03-26 DIAGNOSIS — G20A1 Parkinson's disease without dyskinesia, without mention of fluctuations: Secondary | ICD-10-CM | POA: Diagnosis not present

## 2023-03-26 DIAGNOSIS — I10 Essential (primary) hypertension: Secondary | ICD-10-CM | POA: Diagnosis not present

## 2023-03-26 DIAGNOSIS — F419 Anxiety disorder, unspecified: Secondary | ICD-10-CM | POA: Diagnosis not present

## 2023-03-26 DIAGNOSIS — F32A Depression, unspecified: Secondary | ICD-10-CM | POA: Diagnosis not present

## 2023-03-26 DIAGNOSIS — E871 Hypo-osmolality and hyponatremia: Secondary | ICD-10-CM | POA: Diagnosis not present

## 2023-03-26 DIAGNOSIS — E119 Type 2 diabetes mellitus without complications: Secondary | ICD-10-CM | POA: Diagnosis not present

## 2023-03-27 DIAGNOSIS — M79642 Pain in left hand: Secondary | ICD-10-CM | POA: Diagnosis not present

## 2023-03-27 DIAGNOSIS — S6292XA Unspecified fracture of left wrist and hand, initial encounter for closed fracture: Secondary | ICD-10-CM | POA: Diagnosis not present

## 2023-03-30 DIAGNOSIS — E119 Type 2 diabetes mellitus without complications: Secondary | ICD-10-CM | POA: Diagnosis not present

## 2023-03-30 DIAGNOSIS — M19072 Primary osteoarthritis, left ankle and foot: Secondary | ICD-10-CM | POA: Diagnosis not present

## 2023-03-30 DIAGNOSIS — M79605 Pain in left leg: Secondary | ICD-10-CM | POA: Diagnosis not present

## 2023-03-30 DIAGNOSIS — M25572 Pain in left ankle and joints of left foot: Secondary | ICD-10-CM | POA: Diagnosis not present

## 2023-03-30 DIAGNOSIS — Z9181 History of falling: Secondary | ICD-10-CM | POA: Diagnosis not present

## 2023-03-30 DIAGNOSIS — Z7984 Long term (current) use of oral hypoglycemic drugs: Secondary | ICD-10-CM | POA: Diagnosis not present

## 2023-03-30 DIAGNOSIS — I10 Essential (primary) hypertension: Secondary | ICD-10-CM | POA: Diagnosis not present

## 2023-03-30 DIAGNOSIS — Z79899 Other long term (current) drug therapy: Secondary | ICD-10-CM | POA: Diagnosis not present

## 2023-04-02 DIAGNOSIS — I10 Essential (primary) hypertension: Secondary | ICD-10-CM | POA: Diagnosis not present

## 2023-04-02 DIAGNOSIS — G20A1 Parkinson's disease without dyskinesia, without mention of fluctuations: Secondary | ICD-10-CM | POA: Diagnosis not present

## 2023-04-02 DIAGNOSIS — F32A Depression, unspecified: Secondary | ICD-10-CM | POA: Diagnosis not present

## 2023-04-02 DIAGNOSIS — E871 Hypo-osmolality and hyponatremia: Secondary | ICD-10-CM | POA: Diagnosis not present

## 2023-04-02 DIAGNOSIS — E119 Type 2 diabetes mellitus without complications: Secondary | ICD-10-CM | POA: Diagnosis not present

## 2023-04-02 DIAGNOSIS — F419 Anxiety disorder, unspecified: Secondary | ICD-10-CM | POA: Diagnosis not present

## 2023-04-03 DIAGNOSIS — F419 Anxiety disorder, unspecified: Secondary | ICD-10-CM | POA: Diagnosis not present

## 2023-04-03 DIAGNOSIS — I10 Essential (primary) hypertension: Secondary | ICD-10-CM | POA: Diagnosis not present

## 2023-04-03 DIAGNOSIS — M79642 Pain in left hand: Secondary | ICD-10-CM | POA: Diagnosis not present

## 2023-04-03 DIAGNOSIS — F32A Depression, unspecified: Secondary | ICD-10-CM | POA: Diagnosis not present

## 2023-04-03 DIAGNOSIS — E871 Hypo-osmolality and hyponatremia: Secondary | ICD-10-CM | POA: Diagnosis not present

## 2023-04-03 DIAGNOSIS — E119 Type 2 diabetes mellitus without complications: Secondary | ICD-10-CM | POA: Diagnosis not present

## 2023-04-03 DIAGNOSIS — G20A1 Parkinson's disease without dyskinesia, without mention of fluctuations: Secondary | ICD-10-CM | POA: Diagnosis not present

## 2023-04-05 DIAGNOSIS — S62603D Fracture of unspecified phalanx of left middle finger, subsequent encounter for fracture with routine healing: Secondary | ICD-10-CM | POA: Diagnosis not present

## 2023-04-05 DIAGNOSIS — G20A1 Parkinson's disease without dyskinesia, without mention of fluctuations: Secondary | ICD-10-CM | POA: Diagnosis not present

## 2023-04-05 DIAGNOSIS — I119 Hypertensive heart disease without heart failure: Secondary | ICD-10-CM | POA: Diagnosis not present

## 2023-04-08 DIAGNOSIS — F419 Anxiety disorder, unspecified: Secondary | ICD-10-CM | POA: Diagnosis not present

## 2023-04-08 DIAGNOSIS — Z9181 History of falling: Secondary | ICD-10-CM | POA: Diagnosis not present

## 2023-04-08 DIAGNOSIS — Z87891 Personal history of nicotine dependence: Secondary | ICD-10-CM | POA: Diagnosis not present

## 2023-04-08 DIAGNOSIS — G20A1 Parkinson's disease without dyskinesia, without mention of fluctuations: Secondary | ICD-10-CM | POA: Diagnosis not present

## 2023-04-08 DIAGNOSIS — E119 Type 2 diabetes mellitus without complications: Secondary | ICD-10-CM | POA: Diagnosis not present

## 2023-04-08 DIAGNOSIS — I10 Essential (primary) hypertension: Secondary | ICD-10-CM | POA: Diagnosis not present

## 2023-04-08 DIAGNOSIS — E871 Hypo-osmolality and hyponatremia: Secondary | ICD-10-CM | POA: Diagnosis not present

## 2023-04-08 DIAGNOSIS — F32A Depression, unspecified: Secondary | ICD-10-CM | POA: Diagnosis not present

## 2023-04-09 DIAGNOSIS — I10 Essential (primary) hypertension: Secondary | ICD-10-CM | POA: Diagnosis not present

## 2023-04-09 DIAGNOSIS — F419 Anxiety disorder, unspecified: Secondary | ICD-10-CM | POA: Diagnosis not present

## 2023-04-09 DIAGNOSIS — E119 Type 2 diabetes mellitus without complications: Secondary | ICD-10-CM | POA: Diagnosis not present

## 2023-04-09 DIAGNOSIS — F32A Depression, unspecified: Secondary | ICD-10-CM | POA: Diagnosis not present

## 2023-04-09 DIAGNOSIS — G20A1 Parkinson's disease without dyskinesia, without mention of fluctuations: Secondary | ICD-10-CM | POA: Diagnosis not present

## 2023-04-09 DIAGNOSIS — E871 Hypo-osmolality and hyponatremia: Secondary | ICD-10-CM | POA: Diagnosis not present

## 2023-04-17 DIAGNOSIS — F3341 Major depressive disorder, recurrent, in partial remission: Secondary | ICD-10-CM | POA: Diagnosis not present

## 2023-04-17 DIAGNOSIS — F411 Generalized anxiety disorder: Secondary | ICD-10-CM | POA: Diagnosis not present

## 2023-04-18 DIAGNOSIS — E871 Hypo-osmolality and hyponatremia: Secondary | ICD-10-CM | POA: Diagnosis not present

## 2023-04-18 DIAGNOSIS — I10 Essential (primary) hypertension: Secondary | ICD-10-CM | POA: Diagnosis not present

## 2023-04-18 DIAGNOSIS — F419 Anxiety disorder, unspecified: Secondary | ICD-10-CM | POA: Diagnosis not present

## 2023-04-18 DIAGNOSIS — G20A1 Parkinson's disease without dyskinesia, without mention of fluctuations: Secondary | ICD-10-CM | POA: Diagnosis not present

## 2023-04-18 DIAGNOSIS — E119 Type 2 diabetes mellitus without complications: Secondary | ICD-10-CM | POA: Diagnosis not present

## 2023-04-18 DIAGNOSIS — F32A Depression, unspecified: Secondary | ICD-10-CM | POA: Diagnosis not present

## 2023-04-22 DIAGNOSIS — I10 Essential (primary) hypertension: Secondary | ICD-10-CM | POA: Diagnosis not present

## 2023-04-22 DIAGNOSIS — F32A Depression, unspecified: Secondary | ICD-10-CM | POA: Diagnosis not present

## 2023-04-22 DIAGNOSIS — F419 Anxiety disorder, unspecified: Secondary | ICD-10-CM | POA: Diagnosis not present

## 2023-04-22 DIAGNOSIS — G20A1 Parkinson's disease without dyskinesia, without mention of fluctuations: Secondary | ICD-10-CM | POA: Diagnosis not present

## 2023-04-22 DIAGNOSIS — E871 Hypo-osmolality and hyponatremia: Secondary | ICD-10-CM | POA: Diagnosis not present

## 2023-04-22 DIAGNOSIS — E119 Type 2 diabetes mellitus without complications: Secondary | ICD-10-CM | POA: Diagnosis not present

## 2023-04-25 DIAGNOSIS — I10 Essential (primary) hypertension: Secondary | ICD-10-CM | POA: Diagnosis not present

## 2023-04-25 DIAGNOSIS — E871 Hypo-osmolality and hyponatremia: Secondary | ICD-10-CM | POA: Diagnosis not present

## 2023-04-25 DIAGNOSIS — G20A1 Parkinson's disease without dyskinesia, without mention of fluctuations: Secondary | ICD-10-CM | POA: Diagnosis not present

## 2023-04-25 DIAGNOSIS — F419 Anxiety disorder, unspecified: Secondary | ICD-10-CM | POA: Diagnosis not present

## 2023-04-25 DIAGNOSIS — E119 Type 2 diabetes mellitus without complications: Secondary | ICD-10-CM | POA: Diagnosis not present

## 2023-04-25 DIAGNOSIS — F32A Depression, unspecified: Secondary | ICD-10-CM | POA: Diagnosis not present

## 2023-04-26 DIAGNOSIS — G20A1 Parkinson's disease without dyskinesia, without mention of fluctuations: Secondary | ICD-10-CM | POA: Diagnosis not present

## 2023-04-26 DIAGNOSIS — F419 Anxiety disorder, unspecified: Secondary | ICD-10-CM | POA: Diagnosis not present

## 2023-04-26 DIAGNOSIS — E119 Type 2 diabetes mellitus without complications: Secondary | ICD-10-CM | POA: Diagnosis not present

## 2023-04-26 DIAGNOSIS — F32A Depression, unspecified: Secondary | ICD-10-CM | POA: Diagnosis not present

## 2023-04-26 DIAGNOSIS — E871 Hypo-osmolality and hyponatremia: Secondary | ICD-10-CM | POA: Diagnosis not present

## 2023-04-26 DIAGNOSIS — I10 Essential (primary) hypertension: Secondary | ICD-10-CM | POA: Diagnosis not present

## 2023-04-29 DIAGNOSIS — G20A1 Parkinson's disease without dyskinesia, without mention of fluctuations: Secondary | ICD-10-CM | POA: Diagnosis not present

## 2023-04-29 DIAGNOSIS — E119 Type 2 diabetes mellitus without complications: Secondary | ICD-10-CM | POA: Diagnosis not present

## 2023-04-29 DIAGNOSIS — F32A Depression, unspecified: Secondary | ICD-10-CM | POA: Diagnosis not present

## 2023-04-29 DIAGNOSIS — F329 Major depressive disorder, single episode, unspecified: Secondary | ICD-10-CM | POA: Diagnosis not present

## 2023-04-29 DIAGNOSIS — F419 Anxiety disorder, unspecified: Secondary | ICD-10-CM | POA: Diagnosis not present

## 2023-04-29 DIAGNOSIS — I10 Essential (primary) hypertension: Secondary | ICD-10-CM | POA: Diagnosis not present

## 2023-04-29 DIAGNOSIS — E871 Hypo-osmolality and hyponatremia: Secondary | ICD-10-CM | POA: Diagnosis not present

## 2023-05-01 DIAGNOSIS — E119 Type 2 diabetes mellitus without complications: Secondary | ICD-10-CM | POA: Diagnosis not present

## 2023-05-01 DIAGNOSIS — F32A Depression, unspecified: Secondary | ICD-10-CM | POA: Diagnosis not present

## 2023-05-01 DIAGNOSIS — G20A1 Parkinson's disease without dyskinesia, without mention of fluctuations: Secondary | ICD-10-CM | POA: Diagnosis not present

## 2023-05-02 DIAGNOSIS — E119 Type 2 diabetes mellitus without complications: Secondary | ICD-10-CM | POA: Diagnosis not present

## 2023-05-02 DIAGNOSIS — I10 Essential (primary) hypertension: Secondary | ICD-10-CM | POA: Diagnosis not present

## 2023-05-02 DIAGNOSIS — F419 Anxiety disorder, unspecified: Secondary | ICD-10-CM | POA: Diagnosis not present

## 2023-05-02 DIAGNOSIS — E871 Hypo-osmolality and hyponatremia: Secondary | ICD-10-CM | POA: Diagnosis not present

## 2023-05-02 DIAGNOSIS — F32A Depression, unspecified: Secondary | ICD-10-CM | POA: Diagnosis not present

## 2023-05-02 DIAGNOSIS — G20A1 Parkinson's disease without dyskinesia, without mention of fluctuations: Secondary | ICD-10-CM | POA: Diagnosis not present

## 2023-05-03 DIAGNOSIS — S62603D Fracture of unspecified phalanx of left middle finger, subsequent encounter for fracture with routine healing: Secondary | ICD-10-CM | POA: Diagnosis not present

## 2023-05-03 DIAGNOSIS — H539 Unspecified visual disturbance: Secondary | ICD-10-CM | POA: Diagnosis not present

## 2023-05-03 DIAGNOSIS — F411 Generalized anxiety disorder: Secondary | ICD-10-CM | POA: Diagnosis not present

## 2023-05-03 DIAGNOSIS — M545 Low back pain, unspecified: Secondary | ICD-10-CM | POA: Diagnosis not present

## 2023-05-03 DIAGNOSIS — E119 Type 2 diabetes mellitus without complications: Secondary | ICD-10-CM | POA: Diagnosis not present

## 2023-05-03 DIAGNOSIS — G20A1 Parkinson's disease without dyskinesia, without mention of fluctuations: Secondary | ICD-10-CM | POA: Diagnosis not present

## 2023-05-03 DIAGNOSIS — F419 Anxiety disorder, unspecified: Secondary | ICD-10-CM | POA: Diagnosis not present

## 2023-05-03 DIAGNOSIS — S62603A Fracture of unspecified phalanx of left middle finger, initial encounter for closed fracture: Secondary | ICD-10-CM | POA: Diagnosis not present

## 2023-05-03 DIAGNOSIS — I10 Essential (primary) hypertension: Secondary | ICD-10-CM | POA: Diagnosis not present

## 2023-05-03 DIAGNOSIS — I119 Hypertensive heart disease without heart failure: Secondary | ICD-10-CM | POA: Diagnosis not present

## 2023-05-03 DIAGNOSIS — R197 Diarrhea, unspecified: Secondary | ICD-10-CM | POA: Diagnosis not present

## 2023-05-03 DIAGNOSIS — F32A Depression, unspecified: Secondary | ICD-10-CM | POA: Diagnosis not present

## 2023-05-03 DIAGNOSIS — E871 Hypo-osmolality and hyponatremia: Secondary | ICD-10-CM | POA: Diagnosis not present

## 2023-05-03 DIAGNOSIS — M25562 Pain in left knee: Secondary | ICD-10-CM | POA: Diagnosis not present

## 2023-05-03 DIAGNOSIS — D519 Vitamin B12 deficiency anemia, unspecified: Secondary | ICD-10-CM | POA: Diagnosis not present

## 2023-05-03 DIAGNOSIS — F3341 Major depressive disorder, recurrent, in partial remission: Secondary | ICD-10-CM | POA: Diagnosis not present

## 2023-05-08 DIAGNOSIS — Z87891 Personal history of nicotine dependence: Secondary | ICD-10-CM | POA: Diagnosis not present

## 2023-05-08 DIAGNOSIS — F419 Anxiety disorder, unspecified: Secondary | ICD-10-CM | POA: Diagnosis not present

## 2023-05-08 DIAGNOSIS — E871 Hypo-osmolality and hyponatremia: Secondary | ICD-10-CM | POA: Diagnosis not present

## 2023-05-08 DIAGNOSIS — F32A Depression, unspecified: Secondary | ICD-10-CM | POA: Diagnosis not present

## 2023-05-08 DIAGNOSIS — E119 Type 2 diabetes mellitus without complications: Secondary | ICD-10-CM | POA: Diagnosis not present

## 2023-05-08 DIAGNOSIS — G20A1 Parkinson's disease without dyskinesia, without mention of fluctuations: Secondary | ICD-10-CM | POA: Diagnosis not present

## 2023-05-08 DIAGNOSIS — I10 Essential (primary) hypertension: Secondary | ICD-10-CM | POA: Diagnosis not present

## 2023-05-08 DIAGNOSIS — Z9181 History of falling: Secondary | ICD-10-CM | POA: Diagnosis not present

## 2023-05-09 DIAGNOSIS — F32A Depression, unspecified: Secondary | ICD-10-CM | POA: Diagnosis not present

## 2023-05-09 DIAGNOSIS — E871 Hypo-osmolality and hyponatremia: Secondary | ICD-10-CM | POA: Diagnosis not present

## 2023-05-09 DIAGNOSIS — F419 Anxiety disorder, unspecified: Secondary | ICD-10-CM | POA: Diagnosis not present

## 2023-05-09 DIAGNOSIS — E119 Type 2 diabetes mellitus without complications: Secondary | ICD-10-CM | POA: Diagnosis not present

## 2023-05-09 DIAGNOSIS — I10 Essential (primary) hypertension: Secondary | ICD-10-CM | POA: Diagnosis not present

## 2023-05-09 DIAGNOSIS — G20A1 Parkinson's disease without dyskinesia, without mention of fluctuations: Secondary | ICD-10-CM | POA: Diagnosis not present

## 2023-05-10 DIAGNOSIS — E871 Hypo-osmolality and hyponatremia: Secondary | ICD-10-CM | POA: Diagnosis not present

## 2023-05-10 DIAGNOSIS — G20A1 Parkinson's disease without dyskinesia, without mention of fluctuations: Secondary | ICD-10-CM | POA: Diagnosis not present

## 2023-05-10 DIAGNOSIS — E119 Type 2 diabetes mellitus without complications: Secondary | ICD-10-CM | POA: Diagnosis not present

## 2023-05-10 DIAGNOSIS — F32A Depression, unspecified: Secondary | ICD-10-CM | POA: Diagnosis not present

## 2023-05-10 DIAGNOSIS — F419 Anxiety disorder, unspecified: Secondary | ICD-10-CM | POA: Diagnosis not present

## 2023-05-10 DIAGNOSIS — I10 Essential (primary) hypertension: Secondary | ICD-10-CM | POA: Diagnosis not present

## 2023-05-15 DIAGNOSIS — M79642 Pain in left hand: Secondary | ICD-10-CM | POA: Diagnosis not present

## 2023-05-15 DIAGNOSIS — G20A1 Parkinson's disease without dyskinesia, without mention of fluctuations: Secondary | ICD-10-CM | POA: Diagnosis not present

## 2023-05-15 DIAGNOSIS — S6292XD Unspecified fracture of left wrist and hand, subsequent encounter for fracture with routine healing: Secondary | ICD-10-CM | POA: Diagnosis not present

## 2023-05-15 DIAGNOSIS — E871 Hypo-osmolality and hyponatremia: Secondary | ICD-10-CM | POA: Diagnosis not present

## 2023-05-15 DIAGNOSIS — I10 Essential (primary) hypertension: Secondary | ICD-10-CM | POA: Diagnosis not present

## 2023-05-15 DIAGNOSIS — F419 Anxiety disorder, unspecified: Secondary | ICD-10-CM | POA: Diagnosis not present

## 2023-05-15 DIAGNOSIS — E119 Type 2 diabetes mellitus without complications: Secondary | ICD-10-CM | POA: Diagnosis not present

## 2023-05-15 DIAGNOSIS — F32A Depression, unspecified: Secondary | ICD-10-CM | POA: Diagnosis not present

## 2023-05-27 DIAGNOSIS — F32A Depression, unspecified: Secondary | ICD-10-CM | POA: Diagnosis not present

## 2023-05-27 DIAGNOSIS — F419 Anxiety disorder, unspecified: Secondary | ICD-10-CM | POA: Diagnosis not present

## 2023-05-27 DIAGNOSIS — I10 Essential (primary) hypertension: Secondary | ICD-10-CM | POA: Diagnosis not present

## 2023-05-27 DIAGNOSIS — G20A1 Parkinson's disease without dyskinesia, without mention of fluctuations: Secondary | ICD-10-CM | POA: Diagnosis not present

## 2023-05-27 DIAGNOSIS — E871 Hypo-osmolality and hyponatremia: Secondary | ICD-10-CM | POA: Diagnosis not present

## 2023-05-27 DIAGNOSIS — E119 Type 2 diabetes mellitus without complications: Secondary | ICD-10-CM | POA: Diagnosis not present

## 2023-05-30 DIAGNOSIS — G20A1 Parkinson's disease without dyskinesia, without mention of fluctuations: Secondary | ICD-10-CM | POA: Diagnosis not present

## 2023-05-30 DIAGNOSIS — F329 Major depressive disorder, single episode, unspecified: Secondary | ICD-10-CM | POA: Diagnosis not present

## 2023-05-30 DIAGNOSIS — E119 Type 2 diabetes mellitus without complications: Secondary | ICD-10-CM | POA: Diagnosis not present

## 2023-05-30 DIAGNOSIS — F419 Anxiety disorder, unspecified: Secondary | ICD-10-CM | POA: Diagnosis not present

## 2023-05-30 DIAGNOSIS — F32A Depression, unspecified: Secondary | ICD-10-CM | POA: Diagnosis not present

## 2023-05-31 DIAGNOSIS — I639 Cerebral infarction, unspecified: Secondary | ICD-10-CM | POA: Diagnosis not present

## 2023-05-31 DIAGNOSIS — E119 Type 2 diabetes mellitus without complications: Secondary | ICD-10-CM | POA: Diagnosis not present

## 2023-05-31 DIAGNOSIS — M545 Low back pain, unspecified: Secondary | ICD-10-CM | POA: Diagnosis not present

## 2023-06-05 DIAGNOSIS — E871 Hypo-osmolality and hyponatremia: Secondary | ICD-10-CM | POA: Diagnosis not present

## 2023-06-05 DIAGNOSIS — F32A Depression, unspecified: Secondary | ICD-10-CM | POA: Diagnosis not present

## 2023-06-05 DIAGNOSIS — I10 Essential (primary) hypertension: Secondary | ICD-10-CM | POA: Diagnosis not present

## 2023-06-05 DIAGNOSIS — G20A1 Parkinson's disease without dyskinesia, without mention of fluctuations: Secondary | ICD-10-CM | POA: Diagnosis not present

## 2023-06-05 DIAGNOSIS — E119 Type 2 diabetes mellitus without complications: Secondary | ICD-10-CM | POA: Diagnosis not present

## 2023-06-05 DIAGNOSIS — F419 Anxiety disorder, unspecified: Secondary | ICD-10-CM | POA: Diagnosis not present

## 2023-06-06 DIAGNOSIS — Z1231 Encounter for screening mammogram for malignant neoplasm of breast: Secondary | ICD-10-CM | POA: Diagnosis not present

## 2023-06-07 DIAGNOSIS — F419 Anxiety disorder, unspecified: Secondary | ICD-10-CM | POA: Diagnosis not present

## 2023-06-07 DIAGNOSIS — E785 Hyperlipidemia, unspecified: Secondary | ICD-10-CM | POA: Diagnosis not present

## 2023-06-07 DIAGNOSIS — G20A1 Parkinson's disease without dyskinesia, without mention of fluctuations: Secondary | ICD-10-CM | POA: Diagnosis not present

## 2023-06-07 DIAGNOSIS — S62601D Fracture of unspecified phalanx of left index finger, subsequent encounter for fracture with routine healing: Secondary | ICD-10-CM | POA: Diagnosis not present

## 2023-06-07 DIAGNOSIS — E871 Hypo-osmolality and hyponatremia: Secondary | ICD-10-CM | POA: Diagnosis not present

## 2023-06-07 DIAGNOSIS — Z7984 Long term (current) use of oral hypoglycemic drugs: Secondary | ICD-10-CM | POA: Diagnosis not present

## 2023-06-07 DIAGNOSIS — F32A Depression, unspecified: Secondary | ICD-10-CM | POA: Diagnosis not present

## 2023-06-07 DIAGNOSIS — R339 Retention of urine, unspecified: Secondary | ICD-10-CM | POA: Diagnosis not present

## 2023-06-07 DIAGNOSIS — M25562 Pain in left knee: Secondary | ICD-10-CM | POA: Diagnosis not present

## 2023-06-07 DIAGNOSIS — E119 Type 2 diabetes mellitus without complications: Secondary | ICD-10-CM | POA: Diagnosis not present

## 2023-06-07 DIAGNOSIS — Z87891 Personal history of nicotine dependence: Secondary | ICD-10-CM | POA: Diagnosis not present

## 2023-06-07 DIAGNOSIS — Z9181 History of falling: Secondary | ICD-10-CM | POA: Diagnosis not present

## 2023-06-07 DIAGNOSIS — I10 Essential (primary) hypertension: Secondary | ICD-10-CM | POA: Diagnosis not present

## 2023-06-18 DIAGNOSIS — Z23 Encounter for immunization: Secondary | ICD-10-CM | POA: Diagnosis not present

## 2023-06-25 DIAGNOSIS — I10 Essential (primary) hypertension: Secondary | ICD-10-CM | POA: Diagnosis not present

## 2023-06-25 DIAGNOSIS — E119 Type 2 diabetes mellitus without complications: Secondary | ICD-10-CM | POA: Diagnosis not present

## 2023-06-25 DIAGNOSIS — S62601D Fracture of unspecified phalanx of left index finger, subsequent encounter for fracture with routine healing: Secondary | ICD-10-CM | POA: Diagnosis not present

## 2023-06-25 DIAGNOSIS — F32A Depression, unspecified: Secondary | ICD-10-CM | POA: Diagnosis not present

## 2023-06-25 DIAGNOSIS — G20A1 Parkinson's disease without dyskinesia, without mention of fluctuations: Secondary | ICD-10-CM | POA: Diagnosis not present

## 2023-06-25 DIAGNOSIS — F419 Anxiety disorder, unspecified: Secondary | ICD-10-CM | POA: Diagnosis not present

## 2023-06-26 DIAGNOSIS — F32A Depression, unspecified: Secondary | ICD-10-CM | POA: Diagnosis not present

## 2023-06-26 DIAGNOSIS — E119 Type 2 diabetes mellitus without complications: Secondary | ICD-10-CM | POA: Diagnosis not present

## 2023-06-26 DIAGNOSIS — G20A1 Parkinson's disease without dyskinesia, without mention of fluctuations: Secondary | ICD-10-CM | POA: Diagnosis not present

## 2023-06-27 DIAGNOSIS — F419 Anxiety disorder, unspecified: Secondary | ICD-10-CM | POA: Diagnosis not present

## 2023-06-27 DIAGNOSIS — F329 Major depressive disorder, single episode, unspecified: Secondary | ICD-10-CM | POA: Diagnosis not present

## 2023-07-01 DIAGNOSIS — G20A1 Parkinson's disease without dyskinesia, without mention of fluctuations: Secondary | ICD-10-CM | POA: Diagnosis not present

## 2023-07-01 DIAGNOSIS — S62601D Fracture of unspecified phalanx of left index finger, subsequent encounter for fracture with routine healing: Secondary | ICD-10-CM | POA: Diagnosis not present

## 2023-07-01 DIAGNOSIS — E119 Type 2 diabetes mellitus without complications: Secondary | ICD-10-CM | POA: Diagnosis not present

## 2023-07-03 DIAGNOSIS — F419 Anxiety disorder, unspecified: Secondary | ICD-10-CM | POA: Diagnosis not present

## 2023-07-03 DIAGNOSIS — F32A Depression, unspecified: Secondary | ICD-10-CM | POA: Diagnosis not present

## 2023-07-03 DIAGNOSIS — S62601D Fracture of unspecified phalanx of left index finger, subsequent encounter for fracture with routine healing: Secondary | ICD-10-CM | POA: Diagnosis not present

## 2023-07-03 DIAGNOSIS — I10 Essential (primary) hypertension: Secondary | ICD-10-CM | POA: Diagnosis not present

## 2023-07-03 DIAGNOSIS — E119 Type 2 diabetes mellitus without complications: Secondary | ICD-10-CM | POA: Diagnosis not present

## 2023-07-03 DIAGNOSIS — G20A1 Parkinson's disease without dyskinesia, without mention of fluctuations: Secondary | ICD-10-CM | POA: Diagnosis not present

## 2023-07-05 DIAGNOSIS — I639 Cerebral infarction, unspecified: Secondary | ICD-10-CM | POA: Diagnosis not present

## 2023-07-05 DIAGNOSIS — G20A1 Parkinson's disease without dyskinesia, without mention of fluctuations: Secondary | ICD-10-CM | POA: Diagnosis not present

## 2023-07-05 DIAGNOSIS — M6281 Muscle weakness (generalized): Secondary | ICD-10-CM | POA: Diagnosis not present

## 2023-07-05 DIAGNOSIS — I119 Hypertensive heart disease without heart failure: Secondary | ICD-10-CM | POA: Diagnosis not present

## 2023-07-07 DIAGNOSIS — Z9181 History of falling: Secondary | ICD-10-CM | POA: Diagnosis not present

## 2023-07-07 DIAGNOSIS — Z87891 Personal history of nicotine dependence: Secondary | ICD-10-CM | POA: Diagnosis not present

## 2023-07-07 DIAGNOSIS — E871 Hypo-osmolality and hyponatremia: Secondary | ICD-10-CM | POA: Diagnosis not present

## 2023-07-07 DIAGNOSIS — G20A1 Parkinson's disease without dyskinesia, without mention of fluctuations: Secondary | ICD-10-CM | POA: Diagnosis not present

## 2023-07-07 DIAGNOSIS — E119 Type 2 diabetes mellitus without complications: Secondary | ICD-10-CM | POA: Diagnosis not present

## 2023-07-07 DIAGNOSIS — Z7984 Long term (current) use of oral hypoglycemic drugs: Secondary | ICD-10-CM | POA: Diagnosis not present

## 2023-07-07 DIAGNOSIS — F32A Depression, unspecified: Secondary | ICD-10-CM | POA: Diagnosis not present

## 2023-07-07 DIAGNOSIS — F419 Anxiety disorder, unspecified: Secondary | ICD-10-CM | POA: Diagnosis not present

## 2023-07-07 DIAGNOSIS — I10 Essential (primary) hypertension: Secondary | ICD-10-CM | POA: Diagnosis not present

## 2023-07-07 DIAGNOSIS — S62601D Fracture of unspecified phalanx of left index finger, subsequent encounter for fracture with routine healing: Secondary | ICD-10-CM | POA: Diagnosis not present

## 2023-07-10 DIAGNOSIS — I10 Essential (primary) hypertension: Secondary | ICD-10-CM | POA: Diagnosis not present

## 2023-07-10 DIAGNOSIS — G20A1 Parkinson's disease without dyskinesia, without mention of fluctuations: Secondary | ICD-10-CM | POA: Diagnosis not present

## 2023-07-10 DIAGNOSIS — F419 Anxiety disorder, unspecified: Secondary | ICD-10-CM | POA: Diagnosis not present

## 2023-07-10 DIAGNOSIS — S62601D Fracture of unspecified phalanx of left index finger, subsequent encounter for fracture with routine healing: Secondary | ICD-10-CM | POA: Diagnosis not present

## 2023-07-10 DIAGNOSIS — E119 Type 2 diabetes mellitus without complications: Secondary | ICD-10-CM | POA: Diagnosis not present

## 2023-07-10 DIAGNOSIS — F32A Depression, unspecified: Secondary | ICD-10-CM | POA: Diagnosis not present

## 2023-07-11 DIAGNOSIS — S62601D Fracture of unspecified phalanx of left index finger, subsequent encounter for fracture with routine healing: Secondary | ICD-10-CM | POA: Diagnosis not present

## 2023-07-11 DIAGNOSIS — F32A Depression, unspecified: Secondary | ICD-10-CM | POA: Diagnosis not present

## 2023-07-11 DIAGNOSIS — E119 Type 2 diabetes mellitus without complications: Secondary | ICD-10-CM | POA: Diagnosis not present

## 2023-07-11 DIAGNOSIS — G20A1 Parkinson's disease without dyskinesia, without mention of fluctuations: Secondary | ICD-10-CM | POA: Diagnosis not present

## 2023-07-11 DIAGNOSIS — F419 Anxiety disorder, unspecified: Secondary | ICD-10-CM | POA: Diagnosis not present

## 2023-07-11 DIAGNOSIS — I10 Essential (primary) hypertension: Secondary | ICD-10-CM | POA: Diagnosis not present

## 2023-07-16 DIAGNOSIS — F32A Depression, unspecified: Secondary | ICD-10-CM | POA: Diagnosis not present

## 2023-07-16 DIAGNOSIS — I10 Essential (primary) hypertension: Secondary | ICD-10-CM | POA: Diagnosis not present

## 2023-07-16 DIAGNOSIS — G20A1 Parkinson's disease without dyskinesia, without mention of fluctuations: Secondary | ICD-10-CM | POA: Diagnosis not present

## 2023-07-16 DIAGNOSIS — S62601D Fracture of unspecified phalanx of left index finger, subsequent encounter for fracture with routine healing: Secondary | ICD-10-CM | POA: Diagnosis not present

## 2023-07-16 DIAGNOSIS — F419 Anxiety disorder, unspecified: Secondary | ICD-10-CM | POA: Diagnosis not present

## 2023-07-16 DIAGNOSIS — E119 Type 2 diabetes mellitus without complications: Secondary | ICD-10-CM | POA: Diagnosis not present

## 2023-07-22 DIAGNOSIS — I10 Essential (primary) hypertension: Secondary | ICD-10-CM | POA: Diagnosis not present

## 2023-07-22 DIAGNOSIS — F419 Anxiety disorder, unspecified: Secondary | ICD-10-CM | POA: Diagnosis not present

## 2023-07-22 DIAGNOSIS — S62601D Fracture of unspecified phalanx of left index finger, subsequent encounter for fracture with routine healing: Secondary | ICD-10-CM | POA: Diagnosis not present

## 2023-07-22 DIAGNOSIS — G20A1 Parkinson's disease without dyskinesia, without mention of fluctuations: Secondary | ICD-10-CM | POA: Diagnosis not present

## 2023-07-22 DIAGNOSIS — F32A Depression, unspecified: Secondary | ICD-10-CM | POA: Diagnosis not present

## 2023-07-22 DIAGNOSIS — E119 Type 2 diabetes mellitus without complications: Secondary | ICD-10-CM | POA: Diagnosis not present

## 2023-07-24 DIAGNOSIS — E119 Type 2 diabetes mellitus without complications: Secondary | ICD-10-CM | POA: Diagnosis not present

## 2023-07-24 DIAGNOSIS — F419 Anxiety disorder, unspecified: Secondary | ICD-10-CM | POA: Diagnosis not present

## 2023-07-24 DIAGNOSIS — G20A1 Parkinson's disease without dyskinesia, without mention of fluctuations: Secondary | ICD-10-CM | POA: Diagnosis not present

## 2023-07-24 DIAGNOSIS — S62601D Fracture of unspecified phalanx of left index finger, subsequent encounter for fracture with routine healing: Secondary | ICD-10-CM | POA: Diagnosis not present

## 2023-07-24 DIAGNOSIS — F32A Depression, unspecified: Secondary | ICD-10-CM | POA: Diagnosis not present

## 2023-07-24 DIAGNOSIS — I10 Essential (primary) hypertension: Secondary | ICD-10-CM | POA: Diagnosis not present

## 2023-07-25 DIAGNOSIS — F419 Anxiety disorder, unspecified: Secondary | ICD-10-CM | POA: Diagnosis not present

## 2023-07-25 DIAGNOSIS — S62601D Fracture of unspecified phalanx of left index finger, subsequent encounter for fracture with routine healing: Secondary | ICD-10-CM | POA: Diagnosis not present

## 2023-07-25 DIAGNOSIS — G20A1 Parkinson's disease without dyskinesia, without mention of fluctuations: Secondary | ICD-10-CM | POA: Diagnosis not present

## 2023-07-25 DIAGNOSIS — I10 Essential (primary) hypertension: Secondary | ICD-10-CM | POA: Diagnosis not present

## 2023-07-25 DIAGNOSIS — E119 Type 2 diabetes mellitus without complications: Secondary | ICD-10-CM | POA: Diagnosis not present

## 2023-07-25 DIAGNOSIS — F32A Depression, unspecified: Secondary | ICD-10-CM | POA: Diagnosis not present

## 2023-07-30 DIAGNOSIS — I10 Essential (primary) hypertension: Secondary | ICD-10-CM | POA: Diagnosis not present

## 2023-07-30 DIAGNOSIS — F32A Depression, unspecified: Secondary | ICD-10-CM | POA: Diagnosis not present

## 2023-07-30 DIAGNOSIS — G20A1 Parkinson's disease without dyskinesia, without mention of fluctuations: Secondary | ICD-10-CM | POA: Diagnosis not present

## 2023-07-30 DIAGNOSIS — S62601D Fracture of unspecified phalanx of left index finger, subsequent encounter for fracture with routine healing: Secondary | ICD-10-CM | POA: Diagnosis not present

## 2023-07-30 DIAGNOSIS — F419 Anxiety disorder, unspecified: Secondary | ICD-10-CM | POA: Diagnosis not present

## 2023-07-30 DIAGNOSIS — E119 Type 2 diabetes mellitus without complications: Secondary | ICD-10-CM | POA: Diagnosis not present

## 2023-08-01 DIAGNOSIS — F419 Anxiety disorder, unspecified: Secondary | ICD-10-CM | POA: Diagnosis not present

## 2023-08-01 DIAGNOSIS — F32A Depression, unspecified: Secondary | ICD-10-CM | POA: Diagnosis not present

## 2023-08-01 DIAGNOSIS — E119 Type 2 diabetes mellitus without complications: Secondary | ICD-10-CM | POA: Diagnosis not present

## 2023-08-01 DIAGNOSIS — S62601D Fracture of unspecified phalanx of left index finger, subsequent encounter for fracture with routine healing: Secondary | ICD-10-CM | POA: Diagnosis not present

## 2023-08-01 DIAGNOSIS — F329 Major depressive disorder, single episode, unspecified: Secondary | ICD-10-CM | POA: Diagnosis not present

## 2023-08-01 DIAGNOSIS — G20A1 Parkinson's disease without dyskinesia, without mention of fluctuations: Secondary | ICD-10-CM | POA: Diagnosis not present

## 2023-08-01 DIAGNOSIS — I10 Essential (primary) hypertension: Secondary | ICD-10-CM | POA: Diagnosis not present

## 2023-08-02 DIAGNOSIS — E119 Type 2 diabetes mellitus without complications: Secondary | ICD-10-CM | POA: Diagnosis not present

## 2023-08-02 DIAGNOSIS — M545 Low back pain, unspecified: Secondary | ICD-10-CM | POA: Diagnosis not present

## 2023-08-02 DIAGNOSIS — E785 Hyperlipidemia, unspecified: Secondary | ICD-10-CM | POA: Diagnosis not present

## 2023-08-05 DIAGNOSIS — E119 Type 2 diabetes mellitus without complications: Secondary | ICD-10-CM | POA: Diagnosis not present

## 2023-08-05 DIAGNOSIS — G20A1 Parkinson's disease without dyskinesia, without mention of fluctuations: Secondary | ICD-10-CM | POA: Diagnosis not present

## 2023-08-05 DIAGNOSIS — F32A Depression, unspecified: Secondary | ICD-10-CM | POA: Diagnosis not present

## 2023-08-05 DIAGNOSIS — I10 Essential (primary) hypertension: Secondary | ICD-10-CM | POA: Diagnosis not present

## 2023-08-05 DIAGNOSIS — F419 Anxiety disorder, unspecified: Secondary | ICD-10-CM | POA: Diagnosis not present

## 2023-08-05 DIAGNOSIS — S62601D Fracture of unspecified phalanx of left index finger, subsequent encounter for fracture with routine healing: Secondary | ICD-10-CM | POA: Diagnosis not present

## 2023-09-06 DIAGNOSIS — I639 Cerebral infarction, unspecified: Secondary | ICD-10-CM | POA: Diagnosis not present

## 2023-09-06 DIAGNOSIS — E119 Type 2 diabetes mellitus without complications: Secondary | ICD-10-CM | POA: Diagnosis not present

## 2023-09-06 DIAGNOSIS — E785 Hyperlipidemia, unspecified: Secondary | ICD-10-CM | POA: Diagnosis not present

## 2023-09-06 DIAGNOSIS — G20A1 Parkinson's disease without dyskinesia, without mention of fluctuations: Secondary | ICD-10-CM | POA: Diagnosis not present

## 2023-09-06 DIAGNOSIS — I119 Hypertensive heart disease without heart failure: Secondary | ICD-10-CM | POA: Diagnosis not present
